# Patient Record
Sex: Female | Born: 1970
Health system: Southern US, Community
[De-identification: ages and names within clinical notes are randomized; demographics above are authoritative.]

## PROBLEM LIST (undated history)

## (undated) DIAGNOSIS — D693 Immune thrombocytopenic purpura: Secondary | ICD-10-CM

## (undated) DIAGNOSIS — F419 Anxiety disorder, unspecified: Secondary | ICD-10-CM

## (undated) DIAGNOSIS — B019 Varicella without complication: Secondary | ICD-10-CM

## (undated) DIAGNOSIS — J439 Emphysema, unspecified: Secondary | ICD-10-CM

## (undated) DIAGNOSIS — I10 Essential (primary) hypertension: Secondary | ICD-10-CM

## (undated) DIAGNOSIS — J301 Allergic rhinitis due to pollen: Secondary | ICD-10-CM

## (undated) DIAGNOSIS — F329 Major depressive disorder, single episode, unspecified: Secondary | ICD-10-CM

## (undated) DIAGNOSIS — R519 Headache, unspecified: Secondary | ICD-10-CM

## (undated) DIAGNOSIS — R7989 Other specified abnormal findings of blood chemistry: Secondary | ICD-10-CM

## (undated) DIAGNOSIS — A0472 Enterocolitis due to Clostridium difficile, not specified as recurrent: Secondary | ICD-10-CM

## (undated) DIAGNOSIS — F32A Depression, unspecified: Secondary | ICD-10-CM

## (undated) DIAGNOSIS — R768 Other specified abnormal immunological findings in serum: Secondary | ICD-10-CM

## (undated) DIAGNOSIS — R7689 Other specified abnormal immunological findings in serum: Secondary | ICD-10-CM

## (undated) DIAGNOSIS — R51 Headache: Secondary | ICD-10-CM

## (undated) HISTORY — PX: TUBAL LIGATION: SHX77

## (undated) HISTORY — DX: Headache, unspecified: R51.9

## (undated) HISTORY — DX: Headache: R51

## (undated) HISTORY — DX: Immune thrombocytopenic purpura: D69.3

## (undated) HISTORY — DX: Enterocolitis due to Clostridium difficile, not specified as recurrent: A04.72

## (undated) HISTORY — DX: Essential (primary) hypertension: I10

## (undated) HISTORY — DX: Other specified abnormal immunological findings in serum: R76.89

## (undated) HISTORY — DX: Varicella without complication: B01.9

## (undated) HISTORY — DX: Emphysema, unspecified: J43.9

## (undated) HISTORY — DX: Allergic rhinitis due to pollen: J30.1

## (undated) HISTORY — DX: Anxiety disorder, unspecified: F41.9

## (undated) HISTORY — DX: Other specified abnormal immunological findings in serum: R76.8

---

## 1995-07-17 HISTORY — PX: TUBAL LIGATION: SHX77

## 2009-10-13 ENCOUNTER — Inpatient Hospital Stay: Payer: Self-pay | Admitting: Internal Medicine

## 2009-12-16 ENCOUNTER — Ambulatory Visit: Payer: Self-pay | Admitting: Family Medicine

## 2012-10-06 ENCOUNTER — Emergency Department: Payer: Self-pay | Admitting: Emergency Medicine

## 2012-10-06 LAB — CBC
HCT: 49 % — ABNORMAL HIGH (ref 35.0–47.0)
HGB: 16.5 g/dL — ABNORMAL HIGH (ref 12.0–16.0)
MCH: 32.6 pg (ref 26.0–34.0)
MCHC: 33.7 g/dL (ref 32.0–36.0)
MCV: 97 fL (ref 80–100)
Platelet: 66 10*3/uL — ABNORMAL LOW (ref 150–440)
RBC: 5.06 10*6/uL (ref 3.80–5.20)
RDW: 12.9 % (ref 11.5–14.5)
WBC: 10.9 10*3/uL (ref 3.6–11.0)

## 2012-10-06 LAB — URINALYSIS, COMPLETE
Bacteria: NONE SEEN
Bilirubin,UR: NEGATIVE
Blood: NEGATIVE
Glucose,UR: NEGATIVE mg/dL (ref 0–75)
Ketone: NEGATIVE
Leukocyte Esterase: NEGATIVE
Nitrite: NEGATIVE
Ph: 7 (ref 4.5–8.0)
Protein: NEGATIVE
RBC,UR: NONE SEEN /HPF (ref 0–5)
Specific Gravity: 1.002 (ref 1.003–1.030)
Squamous Epithelial: <1
WBC UR: NONE SEEN /HPF (ref 0–5)

## 2012-10-06 LAB — COMPREHENSIVE METABOLIC PANEL
Albumin: 4.2 g/dL (ref 3.4–5.0)
Alkaline Phosphatase: 65 U/L (ref 50–136)
Anion Gap: 7 (ref 7–16)
BUN: 9 mg/dL (ref 7–18)
Bilirubin,Total: 0.6 mg/dL (ref 0.2–1.0)
Calcium, Total: 8.9 mg/dL (ref 8.5–10.1)
Chloride: 104 mmol/L (ref 98–107)
Co2: 22 mmol/L (ref 21–32)
Creatinine: 0.8 mg/dL (ref 0.60–1.30)
EGFR (African American): >60
EGFR (Non-African Amer.): >60
Glucose: 109 mg/dL — ABNORMAL HIGH (ref 65–99)
Osmolality: 266 (ref 275–301)
Potassium: 3.6 mmol/L (ref 3.5–5.1)
SGOT(AST): 28 U/L (ref 15–37)
SGPT (ALT): 22 U/L (ref 12–78)
Sodium: 133 mmol/L — ABNORMAL LOW (ref 136–145)
Total Protein: 7.9 g/dL (ref 6.4–8.2)

## 2012-10-06 LAB — TROPONIN I: Troponin-I: <0.02 ng/mL

## 2012-10-22 LAB — LIPID PANEL
Cholesterol: 237 mg/dL — AB (ref 0–200)
HDL: 62 mg/dL (ref 35–70)
LDL Cholesterol: 132 mg/dL
Triglycerides: 216 mg/dL — AB (ref 40–160)

## 2012-10-22 LAB — CBC AND DIFFERENTIAL
HCT: 47 % — AB (ref 36–46)
Hemoglobin: 16.1 g/dL — AB (ref 12.0–16.0)
Platelets: 93 10*3/uL — AB (ref 150–399)
WBC: 7.1 10^3/mL

## 2012-10-22 LAB — HEPATIC FUNCTION PANEL
ALT: 11 U/L (ref 7–35)
AST: 16 U/L (ref 13–35)
Alkaline Phosphatase: 57 U/L (ref 25–125)
Bilirubin, Total: 1.3 mg/dL

## 2012-10-22 LAB — BASIC METABOLIC PANEL
BUN: 7 mg/dL (ref 4–21)
Creatinine: 0.7 mg/dL (ref 0.5–1.1)
Glucose: 75 mg/dL
Potassium: 3.7 mmol/L (ref 3.4–5.3)
Sodium: 14 mmol/L — AB (ref 137–147)

## 2012-10-22 LAB — TSH: TSH: 2.94 u[IU]/mL (ref 0.41–5.90)

## 2014-08-06 ENCOUNTER — Ambulatory Visit: Payer: Self-pay | Admitting: Nurse Practitioner

## 2014-08-23 ENCOUNTER — Encounter: Payer: Self-pay | Admitting: Nurse Practitioner

## 2014-08-23 ENCOUNTER — Encounter (INDEPENDENT_AMBULATORY_CARE_PROVIDER_SITE_OTHER): Payer: Self-pay

## 2014-08-23 ENCOUNTER — Ambulatory Visit (INDEPENDENT_AMBULATORY_CARE_PROVIDER_SITE_OTHER): Payer: BLUE CROSS/BLUE SHIELD | Admitting: Nurse Practitioner

## 2014-08-23 VITALS — BP 162/98 | HR 122 | Temp 98.2°F | Resp 12 | Ht 64.5 in | Wt 183.0 lb

## 2014-08-23 DIAGNOSIS — Z23 Encounter for immunization: Secondary | ICD-10-CM

## 2014-08-23 DIAGNOSIS — F411 Generalized anxiety disorder: Secondary | ICD-10-CM

## 2014-08-23 DIAGNOSIS — I10 Essential (primary) hypertension: Secondary | ICD-10-CM

## 2014-08-23 DIAGNOSIS — Z7189 Other specified counseling: Secondary | ICD-10-CM

## 2014-08-23 DIAGNOSIS — Z720 Tobacco use: Secondary | ICD-10-CM

## 2014-08-23 DIAGNOSIS — E669 Obesity, unspecified: Secondary | ICD-10-CM | POA: Insufficient documentation

## 2014-08-23 DIAGNOSIS — F17200 Nicotine dependence, unspecified, uncomplicated: Secondary | ICD-10-CM

## 2014-08-23 DIAGNOSIS — Z7689 Persons encountering health services in other specified circumstances: Secondary | ICD-10-CM

## 2014-08-23 HISTORY — DX: Generalized anxiety disorder: F41.1

## 2014-08-23 MED ORDER — METOPROLOL SUCCINATE ER 25 MG PO TB24: 25.0000 mg | ORAL_TABLET | Freq: Every day | ORAL | Status: DC

## 2014-08-23 MED ORDER — BUPROPION HCL ER (XL) 150 MG PO TB24: 150.0000 mg | ORAL_TABLET | Freq: Every day | ORAL | Status: DC

## 2014-08-23 NOTE — Assessment & Plan Note (Addendum)
Establishing care today going over information related to PFSHx and acute/chronic problems. Pt due for annual physical. Got flu and tdap today

## 2014-08-23 NOTE — Patient Instructions (Addendum)
Follow up in 1 month for an annual physical   -Mammogram and pap smear at that time  -Follow up on BP and Anxiety medications   Check BP and write down to bring to next visit.   You can up to 2 of the wellbutrin pills after 7 days.   Generalized Anxiety Disorder Generalized anxiety disorder (GAD) is a mental disorder. It interferes with life functions, including relationships, work, and school. GAD is different from normal anxiety, which everyone experiences at some point in their lives in response to specific life events and activities. Normal anxiety actually helps [place] prepare for and get through these life events and activities. Normal anxiety goes away after the event or activity is over.  GAD causes anxiety that is not necessarily related to specific events or activities. It also causes excess anxiety in proportion to specific events or activities. The anxiety associated with GAD is also difficult to control. GAD can vary from mild to severe. People with severe GAD can have intense waves of anxiety with physical symptoms (panic attacks).  SYMPTOMS The anxiety and worry associated with GAD are difficult to control. This anxiety and worry are related to many life events and activities and also occur more days than not for 6 months or longer. People with GAD also have three or more of the following symptoms (one or more in children):  Restlessness.   Fatigue.  Difficulty concentrating.   Irritability.  Muscle tension.  Difficulty sleeping or unsatisfying sleep. DIAGNOSIS GAD is diagnosed through an assessment by your health care provider. Your health care provider will ask you questions aboutyour mood,physical symptoms, and events in your life. Your health care provider may ask you about your medical history and use of alcohol or drugs, including prescription medicines. Your health care provider may also do a physical exam and blood tests. Certain medical conditions and the use of  certain substances can cause symptoms similar to those associated with GAD. Your health care provider may refer you to a mental health specialist for further evaluation. TREATMENT The following therapies are usually used to treat GAD:   Medication. Antidepressant medication usually is prescribed for long-term daily control. Antianxiety medicines may be added in severe cases, especially when panic attacks occur.   Talk therapy (psychotherapy). Certain types of talk therapy can be helpful in treating GAD by providing support, education, and guidance. A form of talk therapy called cognitive behavioral therapy can teach you healthy ways to think about and react to daily life events and activities.  Stress managementtechniques. These include yoga, meditation, and exercise and can be very helpful when they are practiced regularly. A mental health specialist can help determine which treatment is best for you. Some people see improvement with one therapy. However, other people require a combination of therapies. Document Released: 10/27/2012 Document Revised: 11/16/2013 Document Reviewed: 10/27/2012 [facility] Patient Information 2015 [place], [place]. This information is not intended to replace advice given to you by your health care provider. Make sure you discuss any questions you have with your health care provider.

## 2014-08-23 NOTE — Progress Notes (Signed)
Subjective:    Patient ID: Carly [name], female    DOB: 01-May-1971, 44 y.o.   MRN: [id number]  HPI  [name] is a 44 yo female with a CC of establishing care and anxiety/sleep disorder. She is here with her husband today.   Saw Dr. Rex Kras in [place] previously and [facility]  1) New Pt info:   Diet- Eats at home and goes out  Exercise- Walks all day at work   Immunizations- Needs flu, Tdap today  Mammogram- Never had before  Pap- 7 years approx.   Bone Density- N/A  Colonoscopy- N/A  PSA- N/A  Eye Exam- 10 years ago   Dental Exam- Last year  LMP- 07/30/2014 lasted 6 days   2) Chronic Problems-  HTN- On High BP meds previously, Atenolol 100 mg daily- made her feel bad, checks when out around pharmacies 160/110, Eats a lot of fast food and drinks caffeine.   B12 was low last year- takes supplements   3) Acute Problems-  Anxiety disorder- PCP, Zoloft increased to 100 mg helpful for sleeping and anxiety. Wakes up with racing heart and mind.   2-3 hours of sleep a night, okay falling asleep, trouble staying asleep. Temazepam 15 mg  Tylenol PM- not helpful, Melatonin- Helped, but not great  Going to a therapist now that she has insurance.   Review of Systems  Constitutional: Positive for fatigue. Negative for fever, chills and diaphoresis.  HENT: Negative for hearing loss and tinnitus.   Eyes: Negative for visual disturbance.  Respiratory: Negative for chest tightness, shortness of breath and wheezing.   Cardiovascular: Negative for chest pain, palpitations and leg swelling.  Gastrointestinal: Negative for nausea, vomiting, diarrhea and constipation.  Genitourinary: Negative for dysuria.  Skin: Negative for rash.  Neurological: Positive for headaches. Negative for dizziness, weakness and numbness.  Psychiatric/Behavioral: Positive for sleep disturbance. The patient is nervous/anxious.    Past Medical History  Diagnosis Date  . Chicken pox   . Emphysema of  lung   . Frequent headaches   . Hay fever   . Hypertension   . Anxiety     History   Social History  . Marital Status: Married    Spouse Name: N/A    Number of Children: N/A  . Years of Education: N/A   Occupational History  . Not on file.   Social History Main Topics  . Smoking status: Current Every Day Smoker -- 1.50 packs/day    Types: Cigarettes  . Smokeless tobacco: Never Used  . Alcohol Use: 4.8 oz/week    8 Not specified per week     Comment: Beer (Can)   . Drug Use: No  . Sexual Activity:    Partners: Male   Other Topics Concern  . Not on file   Social History Narrative   60 Year college    Works in a [employer]   Married to [place]    Enjoys coloring    Pets: Dog lives inside   Children: 80 Son 19 Son       Past Surgical History  Procedure Laterality Date  . Tubal ligation  1997    Family History  Problem Relation Age of Onset  . Hyperlipidemia Mother   . Heart disease Mother   . Stroke Mother   . Depression Mother   . Mental illness Mother   . Hyperlipidemia Father   . Depression Father   . Mental illness Father   .  Diabetes Paternal Grandfather     No Known Allergies  No current outpatient prescriptions on file prior to visit.   No current facility-administered medications on file prior to visit.      Objective:   Physical Exam  Constitutional: She is oriented to person, place, and time. She appears well-developed and well-nourished. No distress.  BP 162/98 mmHg  Pulse 122  Temp(Src) 98.2 F (36.8 C) (Oral)  Resp 12  Ht 5' 4.5" (1.638 m)  Wt 183 lb (83.008 kg)  BMI 30.94 kg/m2  SpO2 97% Pulse repeat is 100   HENT:  Head: Normocephalic and atraumatic.  Right Ear: External ear normal.  Left Ear: External ear normal.  Mouth/Throat: No oropharyngeal exudate.  Eyes: Conjunctivae and EOM are normal. Pupils are equal, round, and reactive to light. Right eye exhibits no discharge. Left eye exhibits no  discharge. No scleral icterus.  Neck: Normal range of motion. Neck supple. No thyromegaly present.  Cardiovascular: Normal rate, regular rhythm, normal heart sounds and intact distal pulses.  Exam reveals no gallop and no friction rub.   No murmur heard. Pulmonary/Chest: Effort normal and breath sounds normal. No respiratory distress. She has no wheezes. She has no rales. She exhibits no tenderness.  Lymphadenopathy:    She has no cervical adenopathy.  Neurological: She is alert and oriented to person, place, and time. No cranial nerve deficit. She exhibits normal muscle tone. Coordination normal.  Skin: Skin is warm and dry. No rash noted. She is not diaphoretic.  Psychiatric: She has a normal mood and affect. Her behavior is normal. Judgment and thought content normal.      Assessment & Plan:

## 2014-08-23 NOTE — Progress Notes (Signed)
Pre visit review using our clinic review tool, if applicable. No additional management support is needed unless otherwise documented below in the visit note. 

## 2014-08-23 NOTE — Assessment & Plan Note (Addendum)
Worsening. Start on Toprol-XL 25 mg and will re-check in 1 month. Asked pt to work on eating healthier, cutting caffeine and work on cessation.

## 2014-08-23 NOTE — Assessment & Plan Note (Signed)
Worsening. Will start seeing psychiatry again since she has insurance. Start on Wellbutrin XL 150 mg daily. Up after 7 days to 300 mg. FU next month.

## 2014-08-23 NOTE — Assessment & Plan Note (Signed)
Will try Wellbutrin for smoking and anxiety. FU next month

## 2014-08-23 NOTE — Assessment & Plan Note (Signed)
Stable. Pt working on eating healthier. Will follow next month.

## 2014-08-24 ENCOUNTER — Telehealth: Payer: Self-pay | Admitting: Nurse Practitioner

## 2014-08-24 NOTE — Telephone Encounter (Signed)
emmi mailed  °

## 2014-08-26 ENCOUNTER — Telehealth: Payer: Self-pay | Admitting: Nurse Practitioner

## 2014-08-26 NOTE — Telephone Encounter (Signed)
emmi mailed  °

## 2014-08-31 ENCOUNTER — Encounter: Payer: Self-pay | Admitting: Nurse Practitioner

## 2014-08-31 ENCOUNTER — Other Ambulatory Visit: Payer: Self-pay | Admitting: Nurse Practitioner

## 2014-08-31 MED ORDER — CLONAZEPAM 0.5 MG PO TABS: 0.5000 mg | ORAL_TABLET | Freq: Every day | ORAL | Status: DC

## 2014-09-07 ENCOUNTER — Ambulatory Visit (INDEPENDENT_AMBULATORY_CARE_PROVIDER_SITE_OTHER): Payer: BLUE CROSS/BLUE SHIELD | Admitting: Nurse Practitioner

## 2014-09-07 ENCOUNTER — Other Ambulatory Visit (HOSPITAL_COMMUNITY)
Admission: RE | Admit: 2014-09-07 | Discharge: 2014-09-07 | Disposition: A | Discharge status: Home / Self Care | Payer: BLUE CROSS/BLUE SHIELD | Source: Ambulatory Visit | Attending: Nurse Practitioner | Admitting: Nurse Practitioner

## 2014-09-07 ENCOUNTER — Encounter: Payer: Self-pay | Admitting: Nurse Practitioner

## 2014-09-07 VITALS — BP 150/100 | HR 101 | Temp 98.6°F | Resp 12 | Ht 64.6 in | Wt 183.8 lb

## 2014-09-07 DIAGNOSIS — F17200 Nicotine dependence, unspecified, uncomplicated: Secondary | ICD-10-CM

## 2014-09-07 DIAGNOSIS — Z1239 Encounter for other screening for malignant neoplasm of breast: Secondary | ICD-10-CM

## 2014-09-07 DIAGNOSIS — Z01419 Encounter for gynecological examination (general) (routine) without abnormal findings: Secondary | ICD-10-CM | POA: Diagnosis not present

## 2014-09-07 DIAGNOSIS — Z1151 Encounter for screening for human papillomavirus (HPV): Secondary | ICD-10-CM | POA: Insufficient documentation

## 2014-09-07 DIAGNOSIS — Z0000 Encounter for general adult medical examination without abnormal findings: Secondary | ICD-10-CM

## 2014-09-07 DIAGNOSIS — E669 Obesity, unspecified: Secondary | ICD-10-CM

## 2014-09-07 DIAGNOSIS — I10 Essential (primary) hypertension: Secondary | ICD-10-CM

## 2014-09-07 DIAGNOSIS — Z720 Tobacco use: Secondary | ICD-10-CM

## 2014-09-07 DIAGNOSIS — F411 Generalized anxiety disorder: Secondary | ICD-10-CM

## 2014-09-07 LAB — LIPID PANEL
Cholesterol: 195 mg/dL (ref 0–200)
HDL: 62.1 mg/dL (ref >39.00)
NonHDL: 132.9
Total CHOL/HDL Ratio: 3
Triglycerides: 234 mg/dL — ABNORMAL HIGH (ref 0.0–149.0)
VLDL: 46.8 mg/dL — ABNORMAL HIGH (ref 0.0–40.0)

## 2014-09-07 LAB — COMPREHENSIVE METABOLIC PANEL
ALT: 12 U/L (ref 0–35)
AST: 16 U/L (ref 0–37)
Albumin: 4.1 g/dL (ref 3.5–5.2)
Alkaline Phosphatase: 57 U/L (ref 39–117)
BUN: 5 mg/dL — ABNORMAL LOW (ref 6–23)
CO2: 25 mEq/L (ref 19–32)
Calcium: 8.8 mg/dL (ref 8.4–10.5)
Chloride: 108 mEq/L (ref 96–112)
Creatinine, Ser: 0.78 mg/dL (ref 0.40–1.20)
GFR: 85.62 mL/min (ref >60.00)
Glucose, Bld: 88 mg/dL (ref 70–99)
Potassium: 3 mEq/L — ABNORMAL LOW (ref 3.5–5.1)
Sodium: 140 mEq/L (ref 135–145)
Total Bilirubin: 0.8 mg/dL (ref 0.2–1.2)
Total Protein: 6.5 g/dL (ref 6.0–8.3)

## 2014-09-07 LAB — LDL CHOLESTEROL, DIRECT: Direct LDL: 102 mg/dL

## 2014-09-07 LAB — TSH: TSH: 2.1 u[IU]/mL (ref 0.35–4.50)

## 2014-09-07 LAB — HEMOGLOBIN A1C: Hgb A1c MFr Bld: 4.9 % (ref 4.6–6.5)

## 2014-09-07 MED ORDER — CLONAZEPAM 0.5 MG PO TABS: 0.5000 mg | ORAL_TABLET | Freq: Every day | ORAL | Status: DC

## 2014-09-07 MED ORDER — SERTRALINE HCL 25 MG PO TABS: 25.0000 mg | ORAL_TABLET | Freq: Every day | ORAL | Status: DC

## 2014-09-07 MED ORDER — METOPROLOL SUCCINATE ER 50 MG PO TB24: 50.0000 mg | ORAL_TABLET | Freq: Every day | ORAL | Status: DC

## 2014-09-07 NOTE — Progress Notes (Signed)
Pre visit review using our clinic review tool, if applicable. No additional management support is needed unless otherwise documented below in the visit note. 

## 2014-09-07 NOTE — Progress Notes (Signed)
Subjective:    Patient ID: Carly [name], female    DOB: January 24, 1971, 44 y.o.   MRN: [id number]  HPI  Carly [name] is a 44 yo female here for annual physical exam.  1) Health Maintenance-   Diet- Cut out sodium  Exercise- walking more in evenings  Immunizations-  UTD  Mammogram- Referral  Pap- Today   Bone Density- N/A  Colonoscopy-N/A  Eye Exam- 4-5 years ago  Dental Exam- Last year  LMP- 08/27/14 lasts 5 days   Past 2 periods heavier for 1 day of the five  Tobacco- less than 1 ppd, would like to stop smoking, wants to try Carly [name] gum.   2) Chronic Problems-  Still feeling anxious, does not like Carly [name], feels made more often. She slept well with the Carly [name] "better than I have in years". She would like to try Carly [name] as it was useful in past.   3) Acute Problems-  HTN- BP up today. Will increase Carly [name]-xl to 50 mg daily.    Cutting out sodium and making lifestyle changes   Review of Systems  Constitutional: Negative for fever, chills, diaphoresis, fatigue and unexpected weight change.  HENT: Negative for tinnitus and trouble swallowing.   Eyes: Negative for visual disturbance.  Respiratory: Negative for chest tightness, shortness of breath and wheezing.   Cardiovascular: Negative for chest pain, palpitations and leg swelling.  Gastrointestinal: Negative for nausea, vomiting, abdominal pain, diarrhea, constipation, blood in stool and rectal pain.  Endocrine: Negative for polydipsia, polyphagia and polyuria.  Genitourinary: Negative for dysuria, hematuria, vaginal discharge and vaginal pain.  Musculoskeletal: Negative for myalgias, back pain, arthralgias and gait problem.  Skin: Negative for color change and rash.  Neurological: Negative for dizziness, weakness, numbness and headaches.  Hematological: Does not bruise/bleed easily.  Psychiatric/Behavioral: Negative for suicidal ideas and sleep disturbance. The patient is not nervous/anxious.    Past Medical History    Diagnosis Date  . Chicken pox   . Emphysema of lung   . Frequent headaches   . Hay fever   . Hypertension   . Anxiety     History   Social History  . Marital Status: Married    Spouse Name: N/A  . Number of Children: N/A  . Years of Education: N/A   Occupational History  . Not on file.   Social History Main Topics  . Smoking status: Current Every Day Smoker -- 1.50 packs/day    Types: Cigarettes  . Smokeless tobacco: Never Used     Comment: Wants to try Carly [name] gum   . Alcohol Use: 4.8 oz/week    8 Standard drinks or equivalent per week     Comment: Beer (Can)   . Drug Use: No  . Sexual Activity:    Partners: Male   Other Topics Concern  . Not on file   Social History Narrative   42 Year college    Works in a [employer]   Married to [place]    Enjoys coloring    Pets: Dog lives inside   Children: 83 Son 38 Son       Past Surgical History  Procedure Laterality Date  . Tubal ligation  1997    Family History  Problem Relation Age of Onset  . Hyperlipidemia Mother   . Heart disease Mother   . Stroke Mother   . Depression Mother   . Mental illness Mother   . Hyperlipidemia Father   . Depression Father   .  Mental illness Father   . Diabetes Paternal Grandfather     No Known Allergies  No current outpatient prescriptions on file prior to visit.   No current facility-administered medications on file prior to visit.      Objective:   Physical Exam  Constitutional: She is oriented to person, place, and time. She appears well-developed and well-nourished. No distress.  BP 150/100 mmHg  Pulse 101  Temp(Src) 98.6 F (37 C) (Oral)  Resp 12  Ht 5' 4.6" (1.641 m)  Wt 183 lb 12.8 oz (83.371 kg)  BMI 30.96 kg/m2  SpO2 98%  LMP  (Approximate)   HENT:  Head: Normocephalic and atraumatic.  Right Ear: External ear normal.  Left Ear: External ear normal.  Nose: Nose normal.  Mouth/Throat: Oropharynx is clear and moist. No  oropharyngeal exudate.  TMs and canals clear bilaterally  Eyes: Conjunctivae and EOM are normal. Pupils are equal, round, and reactive to light. Right eye exhibits no discharge. Left eye exhibits no discharge. No scleral icterus.  Neck: Normal range of motion. Neck supple. No thyromegaly present.  Cardiovascular: Normal rate, regular rhythm, normal heart sounds and intact distal pulses.  Exam reveals no gallop and no friction rub.   No murmur heard. Pulmonary/Chest: Effort normal. No respiratory distress. She has wheezes. She has no rales. She exhibits no tenderness.  1 wheeze heard the first time, repeat no wheezing   Abdominal: Soft. Bowel sounds are normal. She exhibits no distension and no mass. There is no tenderness. There is no rebound and no guarding.  Genitourinary: Vagina normal. No vaginal discharge found.  Breast exam normal, no masses, increased hair growth on chest and chin. PAP obtained.   Musculoskeletal: Normal range of motion. She exhibits no edema or tenderness.  Lymphadenopathy:    She has no cervical adenopathy.  Neurological: She is alert and oriented to person, place, and time. She has normal reflexes. No cranial nerve deficit. She exhibits normal muscle tone. Coordination normal.  Skin: Skin is warm and dry. No rash noted. She is not diaphoretic. No erythema. No pallor.  Psychiatric: She has a normal mood and affect. Her behavior is normal. Judgment and thought content normal.      Assessment & Plan:

## 2014-09-07 NOTE — Patient Instructions (Signed)
Please make a nurse visit for BP check in 2 weeks.   Health Maintenance Adopting a healthy lifestyle and getting preventive care can go a long way to promote health and wellness. Talk with your health care provider about what schedule of regular examinations is right for you. This is a good chance for you to check in with your provider about disease prevention and staying healthy. In between checkups, there are plenty of things you can do on your own. Experts have done a lot of research about which lifestyle changes and preventive measures are most likely to keep you healthy. Ask your health care provider for more information. WEIGHT AND DIET  Eat a healthy diet  Be sure to include plenty of vegetables, fruits, low-fat dairy products, and lean protein.  Do not eat a lot of foods high in solid fats, added sugars, or salt.  Get regular exercise. This is one of the most important things you can do for your health.  Most adults should exercise for at least 150 minutes each week. The exercise should increase your heart rate and make you sweat (moderate-intensity exercise).  Most adults should also do strengthening exercises at least twice a week. This is in addition to the moderate-intensity exercise.  Maintain a healthy weight  Body mass index (BMI) is a measurement that can be used to identify possible weight problems. It estimates body fat based on height and weight. Your health care provider can help determine your BMI and help you achieve or maintain a healthy weight.  For females 9 years of age and older:   A BMI below 18.5 is considered underweight.  A BMI of 18.5 to 24.9 is normal.  A BMI of 25 to 29.9 is considered overweight.  A BMI of 30 and above is considered obese.  Watch levels of cholesterol and blood lipids  You should start having your blood tested for lipids and cholesterol at 43 years of age, then have this test every 5 years.  You may need to have your  cholesterol levels checked more often if:  Your lipid or cholesterol levels are high.  You are older than 43 years of age.  You are at high risk for heart disease.  CANCER SCREENING   Lung Cancer  Lung cancer screening is recommended for adults 85-34 years old who are at high risk for lung cancer because of a history of smoking.  A yearly low-dose CT scan of the lungs is recommended for people who:  Currently smoke.  Have quit within the past 15 years.  Have at least a 30-pack-year history of smoking. A pack year is smoking an average of one pack of cigarettes a day for 1 year.  Yearly screening should continue until it has been 15 years since you quit.  Yearly screening should stop if you develop a health problem that would prevent you from having lung cancer treatment.  Breast Cancer  Practice breast self-awareness. This means understanding how your breasts normally appear and feel.  It also means doing regular breast self-exams. Let your health care provider know about any changes, no matter how small.  If you are in your 20s or 30s, you should have a clinical breast exam (CBE) by a health care provider every 1-3 years as part of a regular health exam.  If you are 31 or older, have a CBE every year. Also consider having a breast X-ray (mammogram) every year.  If you have a family history of breast cancer,  talk to your health care provider about genetic screening.  If you are at high risk for breast cancer, talk to your health care provider about having an MRI and a mammogram every year.  Breast cancer gene (BRCA) assessment is recommended for women who have family members with BRCA-related cancers. BRCA-related cancers include:  Breast.  Ovarian.  Tubal.  Peritoneal cancers.  Results of the assessment will determine the need for genetic counseling and BRCA1 and BRCA2 testing. Cervical Cancer Routine pelvic examinations to screen for cervical cancer are no  longer recommended for nonpregnant women who are considered low risk for cancer of the pelvic organs (ovaries, uterus, and vagina) and who do not have symptoms. A pelvic examination may be necessary if you have symptoms including those associated with pelvic infections. Ask your health care provider if a screening pelvic exam is right for you.   The Pap test is the screening test for cervical cancer for women who are considered at risk.  If you had a hysterectomy for a problem that was not cancer or a condition that could lead to cancer, then you no longer need Pap tests.  If you are older than 65 years, and you have had normal Pap tests for the past 10 years, you no longer need to have Pap tests.  If you have had past treatment for cervical cancer or a condition that could lead to cancer, you need Pap tests and screening for cancer for at least 20 years after your treatment.  If you no longer get a Pap test, assess your risk factors if they change (such as having a new sexual partner). This can affect whether you should start being screened again.  Some women have medical problems that increase their chance of getting cervical cancer. If this is the case for you, your health care provider may recommend more frequent screening and Pap tests.  The human papillomavirus (HPV) test is another test that may be used for cervical cancer screening. The HPV test looks for the virus that can cause cell changes in the cervix. The cells collected during the Pap test can be tested for HPV.  The HPV test can be used to screen women 12 years of age and older. Getting tested for HPV can extend the interval between normal Pap tests from three to five years.  An HPV test also should be used to screen women of any age who have unclear Pap test results.  After 43 years of age, women should have HPV testing as often as Pap tests.  Colorectal Cancer  This type of cancer can be detected and often  prevented.  Routine colorectal cancer screening usually begins at 43 years of age and continues through 43 years of age.  Your health care provider may recommend screening at an earlier age if you have risk factors for colon cancer.  Your health care provider may also recommend using home test kits to check for hidden blood in the stool.  A small camera at the end of a tube can be used to examine your colon directly (sigmoidoscopy or colonoscopy). This is done to check for the earliest forms of colorectal cancer.  Routine screening usually begins at age 35.  Direct examination of the colon should be repeated every 5-10 years through 43 years of age. However, you may need to be screened more often if early forms of precancerous polyps or small growths are found. Skin Cancer  Check your skin from head to toe regularly.  Tell your health care provider about any new moles or changes in moles, especially if there is a change in a mole's shape or color.  Also tell your health care provider if you have a mole that is larger than the size of a pencil eraser.  Always use sunscreen. Apply sunscreen liberally and repeatedly throughout the day.  Protect yourself by wearing long sleeves, pants, a wide-brimmed hat, and sunglasses whenever you are outside. HEART DISEASE, DIABETES, AND HIGH BLOOD PRESSURE   Have your blood pressure checked at least every 1-2 years. High blood pressure causes heart disease and increases the risk of stroke.  If you are between 72 years and 44 years old, ask your health care provider if you should take aspirin to prevent strokes.  Have regular diabetes screenings. This involves taking a blood sample to check your fasting blood sugar level.  If you are at a normal weight and have a low risk for diabetes, have this test once every three years after 43 years of age.  If you are overweight and have a high risk for diabetes, consider being tested at a younger age or more  often. PREVENTING INFECTION  Hepatitis B  If you have a higher risk for hepatitis B, you should be screened for this virus. You are considered at high risk for hepatitis B if:  You were born in a country where hepatitis B is common. Ask your health care provider which countries are considered high risk.  Your parents were born in a high-risk country, and you have not been immunized against hepatitis B (hepatitis B vaccine).  You have HIV or AIDS.  You use needles to inject street drugs.  You live with someone who has hepatitis B.  You have had sex with someone who has hepatitis B.  You get hemodialysis treatment.  You take certain medicines for conditions, including cancer, organ transplantation, and autoimmune conditions. Hepatitis C  Blood testing is recommended for:  Everyone born from [birth year] through 1964.  Anyone with known risk factors for hepatitis C. Sexually transmitted infections (STIs)  You should be screened for sexually transmitted infections (STIs) including gonorrhea and chlamydia if:  You are sexually active and are younger than 43 years of age.  You are older than 43 years of age and your health care provider tells you that you are at risk for this type of infection.  Your sexual activity has changed since you were last screened and you are at an increased risk for chlamydia or gonorrhea. Ask your health care provider if you are at risk.  If you do not have HIV, but are at risk, it may be recommended that you take a prescription medicine daily to prevent HIV infection. This is called pre-exposure prophylaxis (PrEP). You are considered at risk if:  You are sexually active and do not regularly use condoms or know the HIV status of your partner(s).  You take drugs by injection.  You are sexually active with a partner who has HIV. Talk with your health care provider about whether you are at high risk of being infected with HIV. If you choose to begin PrEP, you  should first be tested for HIV. You should then be tested every 3 months for as long as you are taking PrEP.  PREGNANCY   If you are premenopausal and you may become pregnant, ask your health care provider about preconception counseling.  If you may become pregnant, take 400 to 800 micrograms (mcg) of folic acid  every day.  If you want to prevent pregnancy, talk to your health care provider about birth control (contraception). OSTEOPOROSIS AND MENOPAUSE   Osteoporosis is a disease in which the bones lose minerals and strength with aging. This can result in serious bone fractures. Your risk for osteoporosis can be identified using a bone density scan.  If you are 63 years of age or older, or if you are at risk for osteoporosis and fractures, ask your health care provider if you should be screened.  Ask your health care provider whether you should take a calcium or vitamin D supplement to lower your risk for osteoporosis.  Menopause may have certain physical symptoms and risks.  Hormone replacement therapy may reduce some of these symptoms and risks. Talk to your health care provider about whether hormone replacement therapy is right for you.  HOME CARE INSTRUCTIONS   Schedule regular health, dental, and eye exams.  Stay current with your immunizations.   Do not use any tobacco products including cigarettes, chewing tobacco, or electronic cigarettes.  If you are pregnant, do not drink alcohol.  If you are breastfeeding, limit how much and how often you drink alcohol.  Limit alcohol intake to no more than 1 drink per day for nonpregnant women. One drink equals 12 ounces of beer, 5 ounces of wine, or 1 ounces of hard liquor.  Do not use street drugs.  Do not share needles.  Ask your health care provider for help if you need support or information about quitting drugs.  Tell your health care provider if you often feel depressed.  Tell your health care provider if you have ever  been abused or do not feel safe at home. Document Released: 01/15/2011 Document Revised: 11/16/2013 Document Reviewed: 06/03/2013 [facility] Patient Information 2015 [place], [place]. This information is not intended to replace advice given to you by your health care provider. Make sure you discuss any questions you have with your health care provider.

## 2014-09-08 ENCOUNTER — Encounter: Payer: Self-pay | Admitting: Nurse Practitioner

## 2014-09-08 ENCOUNTER — Other Ambulatory Visit: Payer: Self-pay | Admitting: Nurse Practitioner

## 2014-09-08 DIAGNOSIS — R7989 Other specified abnormal findings of blood chemistry: Secondary | ICD-10-CM

## 2014-09-08 DIAGNOSIS — L680 Hirsutism: Secondary | ICD-10-CM

## 2014-09-08 LAB — TESTOSTERONE: Testosterone: 114 ng/dL — ABNORMAL HIGH (ref 10–70)

## 2014-09-09 LAB — CYTOLOGY - PAP

## 2014-09-10 ENCOUNTER — Encounter: Payer: Self-pay | Admitting: Nurse Practitioner

## 2014-09-10 DIAGNOSIS — Z0000 Encounter for general adult medical examination without abnormal findings: Secondary | ICD-10-CM | POA: Insufficient documentation

## 2014-09-10 NOTE — Assessment & Plan Note (Signed)
Wellbutrin not helpful. Wants to try nicorette gum. Discussed options with pt and she is willing to try to stop smoking. Understands health risks.

## 2014-09-10 NOTE — Assessment & Plan Note (Addendum)
Increased Metoprolol succinate 50 mg daily. FU in 2 wks for BP check nurse visit.

## 2014-09-10 NOTE — Assessment & Plan Note (Signed)
Stop Wellbutrin and start Zoloft 25 mg. Follow up in 6 weeks. Refill of Klonopin 0.5 mg since it was helpful for anxiety and sleep.

## 2014-09-10 NOTE — Assessment & Plan Note (Signed)
Increased walking and cut down on sodium in diet. Small changes being made. Will follow.

## 2014-09-10 NOTE — Assessment & Plan Note (Signed)
Discussed acute and chronic issues. Reviewed health maintenance measures, PFSHx, and immunizations. Obtain routine labs TSH, Lipid panel, CBC w/ diff, A1c, and CMET. Non-routine labs: Testosterone for hirsutism. PAP and Breast exam performed today with normal findings. Mammogram referral and endocrine referral placed.

## 2014-09-13 LAB — TESTOSTERONE, FREE, TOTAL, SHBG
Sex Hormone Binding: 113 nmol/L (ref 17–124)
Testosterone, Free: 8.1 pg/mL — ABNORMAL HIGH (ref 0.6–6.8)
Testosterone-% Free: 0.7 % (ref 0.4–2.4)
Testosterone: 108 ng/dL — ABNORMAL HIGH (ref 10–70)

## 2014-09-21 ENCOUNTER — Ambulatory Visit: Payer: BLUE CROSS/BLUE SHIELD | Admitting: *Deleted

## 2014-09-21 ENCOUNTER — Other Ambulatory Visit: Payer: Self-pay | Admitting: Nurse Practitioner

## 2014-09-21 VITALS — BP 152/96 | HR 102

## 2014-09-21 DIAGNOSIS — I10 Essential (primary) hypertension: Secondary | ICD-10-CM

## 2014-09-21 MED ORDER — AMLODIPINE BESYLATE 5 MG PO TABS: 5.0000 mg | ORAL_TABLET | Freq: Every day | ORAL | Status: DC

## 2014-09-21 NOTE — Progress Notes (Signed)
Pt presented for BP check. Confirmed taking Metoprolol 50 mg daily. Has readings from earlier today, 150/107 and 168/113. BP here 152/96 HR 102. [place] notified. Advised to start Amlodipine 5 mg daily and continue Metoprolol. Advised to continue to monitor BP and notify us of continuing high readings. Advised to schedule appt with Morey Hummingbird in 3 weeks,  verbalized understanding. Appt scheduled 10/12/14.

## 2014-09-22 ENCOUNTER — Encounter: Payer: Self-pay | Admitting: Endocrinology

## 2014-09-22 ENCOUNTER — Ambulatory Visit (INDEPENDENT_AMBULATORY_CARE_PROVIDER_SITE_OTHER): Payer: BLUE CROSS/BLUE SHIELD | Admitting: Endocrinology

## 2014-09-22 VITALS — BP 162/94 | HR 99 | Resp 14 | Ht 64.5 in | Wt 183.0 lb

## 2014-09-22 DIAGNOSIS — I10 Essential (primary) hypertension: Secondary | ICD-10-CM

## 2014-09-22 DIAGNOSIS — L680 Hirsutism: Secondary | ICD-10-CM

## 2014-09-22 DIAGNOSIS — R7989 Other specified abnormal findings of blood chemistry: Secondary | ICD-10-CM

## 2014-09-22 DIAGNOSIS — E349 Endocrine disorder, unspecified: Secondary | ICD-10-CM

## 2014-09-22 MED ORDER — DEXAMETHASONE 1 MG PO TABS: ORAL_TABLET | ORAL | Status: DC

## 2014-09-22 NOTE — Assessment & Plan Note (Signed)
We discussed about causes of elevated Testosterone in her age group including most likely PCOS, subclinical Cushing's , adrenal adenoma, hyperPRL.   Would like her to come back this week and will test morning cortisol after overnight 1 mg dex, PRL, BMP, DHEAS.   If the above workup is negative, then she most likely has PCOS. Encouraged her to follow healthy lifestyle.  Currently, she is experiencing hirsutism, elevated Testosterone and some irreg in menses.  We discussed various treatments for her symptoms.  Based On labs , will decide of whether to initiate Ortho-tri-cyclenlo ( pt preference towards starting OCPs) or Spirinolactone. I am little hesistant to start OCPs due to her smoking status, age , FH breast cancer.

## 2014-09-22 NOTE — Patient Instructions (Signed)
Work on [employer] and exercise.  Work on quitting smoking- going to discuss Chantix with PCP at fu appt.   You should do a "dexamethasone suppression test."  for this- Take dexamethasone 1 mg at 11 pm, then come in for a "cortisol" blood test the next morning between 8-9 am. You do not need to be fasting for this test.  Return for labs tomorrow between 8-9 am.   Based on lab testing- will consider trial of oral contraceptive pills versus Spironolactone.   Please come back for a follow-up appointment in 3 months

## 2014-09-22 NOTE — Assessment & Plan Note (Signed)
encouaraged compliance with recently added Amlodipine. She has follow up with PCP in few weeks.

## 2014-09-22 NOTE — Assessment & Plan Note (Signed)
Likely from elevated Testosterone. Continue manual methods of hair removal at this time.

## 2014-09-22 NOTE — Progress Notes (Signed)
Pre visit review using our clinic review tool, if applicable. No additional management support is needed unless otherwise documented below in the visit note. 

## 2014-09-22 NOTE — Progress Notes (Signed)
Patient ID: Carly [name], female   DOB: Apr 26, 1971, 44 y.o.   MRN: [id number] HPI: Carly [name] is a 44 y.o. female, referred by her PCP,  Doss, Velora Heckler, NP, for evaluation for elevated Testosterone/Hirsuitism.  The above symptom was recently discussed at her PCP appointment and labs checked Feb 2015 showed an elevated Testosterone level.  Lab Results  Component Value Date   TESTOSTERONE 114* 09/07/2014     Fertility/Menstrual cycles: - menarche at age at age 84 - irregular menses only recently- occuring every  3 weeks, recently in past 2 times 1 day of 4-5 days would be lot heavier- happening for past 3-4 months - no h/o ovarian cysts - children: 2 - miscarriages: 0 - contraception:TL -No breast discharge   Hirsutism: -Onset around 2006 Some on chest and chin Shaves 3-4 times daily Gradually gotten worse over the years No voice change Scalp hair thinning in the past year   Acne: - none significant  Weight gain: - recently stable - no steroid use - no weight loss meds  -No abnormal stretch marks or easy bruising - Diets tried: - has been following a relatively healthier lifestyle     Associated history of : - no CAD - No DM or Pre-DM - No hyperlipidemia - No hypothyroidism BP on and off for 10 years. Recently amlodipine was added to her regimen- but she hasn't yet started it. Today has elevated BP, but denies any systemic symptoms.     Treatments tried: - did not try Metformin - did not try Spironolactone. Recently was detected with low K and is eating more bananas these days - did not try Vaniqua - not on OCPs. No prior personal hx blood clots. Paternal cousin has hx breast cancer. Maternal sister has cervical cancer.  - Last thyroid tests: Lab Results  Component Value Date   TSH 2.10 09/07/2014    - Last set of lipids:    Component Value Date/Time   CHOL 195 09/07/2014 1418   TRIG 234.0* 09/07/2014 1418   HDL 62.10 09/07/2014 1418   CHOLHDL 3  09/07/2014 1418   VLDL 46.8* 09/07/2014 1418    - Last HbA1c: Lab Results  Component Value Date   HGBA1C 4.9 09/07/2014    Pt has FH of DM in grandfather.  FH of PCOS/hirsutism in none.  Review of Systems: [x]  complains of  [  ] denies General:   [  ] Recent weight change [ x ] Fatigue  [  ] Loss of appetite Eyes: [  ]  Vision Difficulty [  ]  Eye pain ENT: [  ]  Hearing difficulty [  ]  Difficulty Swallowing CVS: [  ] Chest pain [  ]  Palpitations/Irregular Heart beat [  ]  Shortness of breath lying flat [  ] Swelling of legs Resp: [  ] Frequent Cough [  ] Shortness of Breath  [  ]  Wheezing GI: [  ] Heartburn  [  ] Nausea or Vomiting  [  ] Diarrhea [  ] Constipation  [  ] Abdominal Pain GU: [  ]  Polyuria  [ x ]  nocturia Bones/joints:  [  ]  Muscle aches  [  ] Joint Pain  [  ] Bone pain Skin/Hair/Nails: [  ]  Rash  [  ] New stretch marks [  ]  Itching [  ] Hair loss [ x ]  Excessive hair growth Reproduction: [  ] Low sexual  desire , [ x ]  Women: Menstrual cycle problems [  ]  Women: Breast Discharge [  ] Men: Difficulty with erections [  ]  Men: Enlarged Breasts CNS: [  ] Frequent Headaches [  ] Blurry vision [  ] Tremors [  ] Seizures [  ] Loss of consciousness [  ] Localized weakness Endocrine: [ x ]  Excess thirst [  x]  Feeling excessively hot [  ]  Feeling excessively cold Heme: [  ]  Easy bruising [  ]  Enlarged glands or lumps in neck Allergy: [  ]  Food allergies [  ] Environmental allergies    PE: BP 162/94 mmHg  Pulse 99  Resp 14  Ht 5' 4.5" (1.638 m)  Wt 183 lb (83.008 kg)  BMI 30.94 kg/m2  SpO2 98%  LMP  (Approximate) Wt Readings from Last 3 Encounters:  09/22/14 183 lb (83.008 kg)  09/07/14 183 lb 12.8 oz (83.371 kg)  08/23/14 183 lb (83.008 kg)   Body mass index is 30.94 kg/(m^2).  Constitutional: overweight, in NAD, no full supraclavicular fat pads Eyes: PERRLA, EOMI, no exophthalmos ENT: moist mucous membranes, no thyromegaly, no cervical  lymphadenopathy Cardiovascular: RRR, No MRG Respiratory: CTA B Gastrointestinal: abdomen soft, NT, ND, BS+ Musculoskeletal: no deformities, strength intact in all 4 Skin: moist, warm;  No acne on face, + dark terminal hair on chin, central abdomen, no skin tags, no acanthosis nigricans, no purple, wide, stretch marks Neurological: no tremor with outstretched hands, DTR normal in all 4  ASSESSMENT: 1. ?PCOS, elevated Testosterone/hirsutism 2. HTN  Problem List Items Addressed This Visit      Cardiovascular and Mediastinum   HTN (hypertension)    encouaraged compliance with recently added Amlodipine. She has follow up with PCP in few weeks.         Musculoskeletal and Integument   Female hirsutism - Primary    Likely from elevated Testosterone. Continue manual methods of hair removal at this time.       Relevant Medications   dexamethasone (DECADRON) tablet   Other Relevant Orders   Prolactin   DHEA-sulfate   Cortisol   Basic metabolic panel     Other   Elevated testosterone level in female    We discussed about causes of elevated Testosterone in her age group including most likely PCOS, subclinical Cushing's , adrenal adenoma, hyperPRL.   Would like her to come back this week and will test morning cortisol after overnight 1 mg dex, PRL, BMP, DHEAS.   If the above workup is negative, then she most likely has PCOS. Encouraged her to follow healthy lifestyle.  Currently, she is experiencing hirsutism, elevated Testosterone and some irreg in menses.  We discussed various treatments for her symptoms.  Based On labs , will decide of whether to initiate Ortho-tri-cyclenlo ( pt preference towards starting OCPs) or Spirinolactone. I am little hesistant to start OCPs due to her smoking status, age , FH breast cancer.        Relevant Medications   dexamethasone (DECADRON) tablet   Other Relevant Orders   Prolactin   DHEA-sulfate   Cortisol   Basic metabolic panel     She  will also discuss Chantix with her PCP at [facility] appointment.  RTC 3 months  Neev [facility]  09/22/2014 12:14 PM

## 2014-09-23 ENCOUNTER — Other Ambulatory Visit (INDEPENDENT_AMBULATORY_CARE_PROVIDER_SITE_OTHER): Payer: BLUE CROSS/BLUE SHIELD

## 2014-09-23 ENCOUNTER — Encounter: Payer: Self-pay | Admitting: Endocrinology

## 2014-09-23 DIAGNOSIS — L680 Hirsutism: Secondary | ICD-10-CM

## 2014-09-23 LAB — CORTISOL: Cortisol, Plasma: 1.2 ug/dL

## 2014-09-23 LAB — BASIC METABOLIC PANEL
BUN: 10 mg/dL (ref 6–23)
CO2: 26 mEq/L (ref 19–32)
Calcium: 9.7 mg/dL (ref 8.4–10.5)
Chloride: 103 mEq/L (ref 96–112)
Creatinine, Ser: 0.79 mg/dL (ref 0.40–1.20)
GFR: 84.35 mL/min (ref >60.00)
Glucose, Bld: 102 mg/dL — ABNORMAL HIGH (ref 70–99)
Potassium: 4.5 mEq/L (ref 3.5–5.1)
Sodium: 138 mEq/L (ref 135–145)

## 2014-09-24 ENCOUNTER — Telehealth: Payer: Self-pay | Admitting: Endocrinology

## 2014-09-24 ENCOUNTER — Other Ambulatory Visit: Payer: Self-pay | Admitting: Endocrinology

## 2014-09-24 DIAGNOSIS — R7989 Other specified abnormal findings of blood chemistry: Secondary | ICD-10-CM

## 2014-09-24 DIAGNOSIS — L680 Hirsutism: Secondary | ICD-10-CM

## 2014-09-24 LAB — PROLACTIN: Prolactin: 4.4 ng/mL

## 2014-09-24 LAB — DHEA-SULFATE: DHEA-SO4: 102 ug/dL (ref 19–231)

## 2014-09-24 MED ORDER — SPIRONOLACTONE 25 MG PO TABS: 25.0000 mg | ORAL_TABLET | Freq: Every day | ORAL | Status: DC

## 2014-09-24 NOTE — Telephone Encounter (Signed)
The patient states it is okay to call in the new medication ( Spironolactone).

## 2014-09-24 NOTE — Telephone Encounter (Signed)
Please advise 

## 2014-09-24 NOTE — Telephone Encounter (Signed)
Noted, script sent to pharmacy

## 2014-10-12 ENCOUNTER — Encounter: Payer: Self-pay | Admitting: Nurse Practitioner

## 2014-10-12 ENCOUNTER — Other Ambulatory Visit (INDEPENDENT_AMBULATORY_CARE_PROVIDER_SITE_OTHER): Payer: BLUE CROSS/BLUE SHIELD

## 2014-10-12 ENCOUNTER — Ambulatory Visit (INDEPENDENT_AMBULATORY_CARE_PROVIDER_SITE_OTHER): Payer: BLUE CROSS/BLUE SHIELD | Admitting: Nurse Practitioner

## 2014-10-12 VITALS — BP 138/80 | HR 99 | Temp 98.1°F | Resp 14 | Ht 64.5 in | Wt 180.4 lb

## 2014-10-12 DIAGNOSIS — L680 Hirsutism: Secondary | ICD-10-CM

## 2014-10-12 DIAGNOSIS — E349 Endocrine disorder, unspecified: Secondary | ICD-10-CM | POA: Diagnosis not present

## 2014-10-12 DIAGNOSIS — I10 Essential (primary) hypertension: Secondary | ICD-10-CM

## 2014-10-12 DIAGNOSIS — R7989 Other specified abnormal findings of blood chemistry: Secondary | ICD-10-CM

## 2014-10-12 MED ORDER — METOPROLOL SUCCINATE ER 50 MG PO TB24: 50.0000 mg | ORAL_TABLET | Freq: Every day | ORAL | Status: DC

## 2014-10-12 NOTE — Progress Notes (Signed)
   Subjective:    Patient ID: Carly [name], female    DOB: 02-01-1971, 44 y.o.   MRN: [id number]  HPI  Carly [name] is a 43 yo female following up of HTN.   Wt Readings from Last 3 Encounters:  10/12/14 180 lb 6.4 oz (81.829 kg)  09/22/14 183 lb (83.008 kg)  09/07/14 183 lb 12.8 oz (83.371 kg)  Feels the best she has felt in years she reports!   Seeing Dr. Howell Rucks for female hirsutism.   Review of Systems  Constitutional: Negative for fever, chills, diaphoresis and fatigue.  Eyes: Negative for visual disturbance.  Respiratory: Negative for chest tightness, shortness of breath and wheezing.   Cardiovascular: Negative for chest pain, palpitations and leg swelling.  Gastrointestinal: Negative for nausea, vomiting, diarrhea and rectal pain.  Skin: Negative for rash.  Neurological: Negative for dizziness, weakness, numbness and headaches.  Psychiatric/Behavioral: The patient is not nervous/anxious.       Objective:   Physical Exam  Constitutional: She is oriented to person, place, and time. She appears well-developed and well-nourished. No distress.  BP 138/80 mmHg  Pulse 99  Temp(Src) 98.1 F (36.7 C) (Oral)  Resp 14  Ht 5' 4.5" (1.638 m)  Wt 180 lb 6.4 oz (81.829 kg)  BMI 30.50 kg/m2  SpO2 97%  LMP 08/23/2014 (Approximate)   HENT:  Head: Normocephalic and atraumatic.  Right Ear: External ear normal.  Left Ear: External ear normal.  Cardiovascular: Normal rate, regular rhythm, normal heart sounds and intact distal pulses.  Exam reveals no gallop and no friction rub.   No murmur heard. Pulmonary/Chest: Effort normal and breath sounds normal. No respiratory distress. She has no wheezes. She has no rales. She exhibits no tenderness.  Neurological: She is alert and oriented to person, place, and time. No cranial nerve deficit. She exhibits normal muscle tone. Coordination normal.  Skin: Skin is warm and dry. No rash noted. She is not diaphoretic.  Psychiatric: She has a normal  mood and affect. Her behavior is normal. Judgment and thought content normal.       Assessment & Plan:

## 2014-10-13 LAB — BASIC METABOLIC PANEL
BUN: 10 mg/dL (ref 6–23)
CO2: 23 mEq/L (ref 19–32)
Calcium: 9.2 mg/dL (ref 8.4–10.5)
Chloride: 105 mEq/L (ref 96–112)
Creatinine, Ser: 0.81 mg/dL (ref 0.40–1.20)
GFR: 81.93 mL/min (ref >60.00)
Glucose, Bld: 94 mg/dL (ref 70–99)
Potassium: 3.7 mEq/L (ref 3.5–5.1)
Sodium: 136 mEq/L (ref 135–145)

## 2014-10-14 ENCOUNTER — Encounter: Payer: Self-pay | Admitting: Endocrinology

## 2014-10-16 NOTE — Assessment & Plan Note (Signed)
Taking amlodipine. Feeling great she reports.

## 2014-10-28 ENCOUNTER — Encounter: Payer: Self-pay | Admitting: Nurse Practitioner

## 2014-11-08 ENCOUNTER — Encounter: Payer: Self-pay | Admitting: Nurse Practitioner

## 2014-11-08 ENCOUNTER — Telehealth: Payer: Self-pay | Admitting: *Deleted

## 2014-11-08 ENCOUNTER — Other Ambulatory Visit: Payer: Self-pay | Admitting: Nurse Practitioner

## 2014-11-08 NOTE — Telephone Encounter (Signed)
Noted. Thanks.

## 2014-11-15 ENCOUNTER — Encounter: Payer: Self-pay | Admitting: Nurse Practitioner

## 2014-11-15 ENCOUNTER — Ambulatory Visit (INDEPENDENT_AMBULATORY_CARE_PROVIDER_SITE_OTHER): Payer: BLUE CROSS/BLUE SHIELD | Admitting: Nurse Practitioner

## 2014-11-15 DIAGNOSIS — J209 Acute bronchitis, unspecified: Secondary | ICD-10-CM

## 2014-11-15 MED ORDER — GUAIFENESIN-CODEINE 100-10 MG/5ML PO SOLN: 5.0000 mL | Freq: Three times a day (TID) | ORAL | Status: DC | PRN

## 2014-11-15 MED ORDER — LEVOFLOXACIN 750 MG PO TABS: 750.0000 mg | ORAL_TABLET | Freq: Every day | ORAL | Status: DC

## 2014-11-15 NOTE — Progress Notes (Signed)
   Subjective:    Patient ID: DESIREA MIZRAHI, female    DOB: 11/23/1970, 44 y.o.   MRN: [id number]  HPI  Ms. Nesbitt is a 44 yo female with a CC of cough, aches, chest congestion, and fever.   1) Pt denies needing EKG since she feels the pressure is due to coughing.  Mucinex- not helpful   Review of Systems  Constitutional: Positive for fever, chills, diaphoresis and fatigue.  HENT: Positive for ear pain, rhinorrhea, sinus pressure and sneezing. Negative for postnasal drip and sore throat.   Eyes: Negative for visual disturbance.  Respiratory: Positive for chest tightness, shortness of breath and wheezing.   Cardiovascular: Negative for chest pain, palpitations and leg swelling.  Gastrointestinal: Negative for nausea, vomiting and diarrhea.  Skin: Negative for rash.      Objective:   Physical Exam  Constitutional: She is oriented to person, place, and time. She appears well-developed and well-nourished. No distress.  BP 140/80 mmHg  Pulse 137  Temp(Src) 99.2 F (37.3 C) (Oral)  Ht 5' 4.5" (1.638 m)  Wt 182 lb (82.555 kg)  BMI 30.77 kg/m2  SpO2 97%   HENT:  Head: Normocephalic and atraumatic.  Right Ear: External ear normal.  Left Ear: External ear normal.  TM's clear, canals red  Eyes: EOM are normal. Pupils are equal, round, and reactive to light. Right eye exhibits no discharge. Left eye exhibits no discharge. No scleral icterus.  Cardiovascular: Normal rate and regular rhythm.   Pulmonary/Chest: Effort normal. No respiratory distress. She has wheezes. She has no rales. She exhibits no tenderness.  Rhonchi  Neurological: She is alert and oriented to person, place, and time. No cranial nerve deficit. She exhibits normal muscle tone. Coordination normal.  Skin: Skin is warm. No rash noted. She is diaphoretic.  Psychiatric: She has a normal mood and affect. Her behavior is normal. Judgment and thought content normal.      Assessment & Plan:

## 2014-11-15 NOTE — Patient Instructions (Signed)
Call if you are not improving after antibiotics.   Please take a probiotic ( Align, Floraque or Culturelle) while you are on the antibiotic to prevent a serious antibiotic associated diarrhea  Called clostirudium dificile colitis and a vaginal yeast infection.

## 2014-11-15 NOTE — Progress Notes (Signed)
Pre visit review using our clinic review tool, if applicable. No additional management support is needed unless otherwise documented below in the visit note. 

## 2014-11-15 NOTE — Assessment & Plan Note (Addendum)
Cannot r/out CAP on top of bronchitis due to multiple sounds on exam. Will try Levaquin daily for 7 days, sent script for robitussin [facility] with pt and verbalized understanding. FU prn worsening/failure to improve.  Encouraged probiotic use.  Deferred EKG because of symptoms and nature of illness. Pt does not feel it is needed either at this time.

## 2014-11-16 ENCOUNTER — Encounter: Payer: Self-pay | Admitting: Nurse Practitioner

## 2014-11-21 ENCOUNTER — Telehealth: Payer: Self-pay | Admitting: Family

## 2014-11-21 DIAGNOSIS — R05 Cough: Secondary | ICD-10-CM

## 2014-11-21 DIAGNOSIS — R059 Cough, unspecified: Secondary | ICD-10-CM

## 2014-11-21 MED ORDER — BENZONATATE 100 MG PO CAPS: 100.0000 mg | ORAL_CAPSULE | Freq: Three times a day (TID) | ORAL | Status: DC | PRN

## 2014-11-21 NOTE — Progress Notes (Signed)
We are sorry that you are not feeling well.  Here is how we plan to help!  Based on what you have shared with me it looks like you have upper respiratory tract inflammation that has resulted in a significant cough.  Inflammation and infection in the upper respiratory tract is commonly called bronchitis and has four common causes:  Allergies, Viral Infections, Acid Reflux and Bacterial Infections.  Allergies, viruses and acid reflux are treated by controlling symptoms or eliminating the cause. An example might be a cough caused by taking certain blood pressure medications. You stop the cough by changing the medication. Another example might be a cough caused by acid reflux. Controlling the reflux helps control the cough.  Based on your presentation I believe you most likely have A cough due to bacteria.  When patients have a fever and a productive cough with a change in color or increased sputum production, we are concerned about bacterial bronchitis.  If left untreated it can progress to pneumonia.  If your symptoms do not improve with your treatment plan it is important that you contact your provider.   I see that you are on Levaquin currently and it is important that we do not add further antibiotics at this time. You may be experiencing some residual effects from your illness. I would like to help you treat the cough and symptoms. Please continue the Levaquin until it runs out.    In addition you may use A non-prescription cough medication called Robitussin DAC. Take 2 teaspoons every 8 hours or Delsym: take 2 teaspoons every 12 hours., A non-prescription cough medication called Mucinex DM: take 2 tablets every 12 hours. and A prescription cough medication called Tessalon Perles 100mg . You may take 1-2 capsules every 8 hours as needed for your cough.    HOME CARE . Only take medications as instructed by your medical team. . Complete the entire course of an antibiotic. . Drink plenty of fluids and get  plenty of rest. . Avoid close contacts especially the very young and the elderly . Cover your mouth if you cough or cough into your sleeve. . Always remember to wash your hands . A steam or ultrasonic humidifier can help congestion.    GET HELP RIGHT AWAY IF: . You develop worsening fever. . You become short of breath . You cough up blood. . Your symptoms persist after you have completed your treatment plan MAKE SURE YOU   Understand these instructions.  Will watch your condition.  Will get help right away if you are not doing well or get worse.  Your e-visit answers were reviewed by a board certified advanced clinical practitioner to complete your personal care plan.  Depending on the condition, your plan could have included both over the counter or prescription medications.  If there is a problem please reply  once you have received a response from your provider.  Your safety is important to [place].  If you have drug allergies check your prescription carefully.    You can use MyChart to ask questions about today's visit, request a non-urgent call back, or ask for a work or school excuse.  You will get an e-mail in the next two days asking about your experience.  I hope that your e-visit has been valuable and will speed your recovery. Thank you for using e-visits.

## 2014-11-24 ENCOUNTER — Other Ambulatory Visit: Payer: Self-pay | Admitting: Nurse Practitioner

## 2014-11-25 ENCOUNTER — Encounter: Payer: Self-pay | Admitting: Nurse Practitioner

## 2014-11-26 ENCOUNTER — Inpatient Hospital Stay
Admission: EM | Admit: 2014-11-26 | Discharge: 2014-11-27 | DRG: 190 | Disposition: A | Discharge status: Home / Self Care | Payer: BLUE CROSS/BLUE SHIELD | Attending: Internal Medicine | Admitting: Internal Medicine

## 2014-11-26 ENCOUNTER — Ambulatory Visit (INDEPENDENT_AMBULATORY_CARE_PROVIDER_SITE_OTHER): Payer: BLUE CROSS/BLUE SHIELD | Admitting: Nurse Practitioner

## 2014-11-26 ENCOUNTER — Telehealth: Payer: Self-pay | Admitting: Nurse Practitioner

## 2014-11-26 ENCOUNTER — Emergency Department: Payer: BLUE CROSS/BLUE SHIELD

## 2014-11-26 ENCOUNTER — Encounter: Payer: Self-pay | Admitting: Nurse Practitioner

## 2014-11-26 ENCOUNTER — Encounter: Payer: Self-pay | Admitting: Emergency Medicine

## 2014-11-26 VITALS — BP 132/74 | HR 122 | Temp 98.2°F | Resp 20 | Ht 64.5 in | Wt 182.8 lb

## 2014-11-26 DIAGNOSIS — J441 Chronic obstructive pulmonary disease with (acute) exacerbation: Secondary | ICD-10-CM | POA: Diagnosis present

## 2014-11-26 DIAGNOSIS — I10 Essential (primary) hypertension: Secondary | ICD-10-CM | POA: Diagnosis present

## 2014-11-26 DIAGNOSIS — J209 Acute bronchitis, unspecified: Secondary | ICD-10-CM | POA: Diagnosis present

## 2014-11-26 DIAGNOSIS — F17210 Nicotine dependence, cigarettes, uncomplicated: Secondary | ICD-10-CM | POA: Diagnosis present

## 2014-11-26 DIAGNOSIS — R6510 Systemic inflammatory response syndrome (SIRS) of non-infectious origin without acute organ dysfunction: Secondary | ICD-10-CM

## 2014-11-26 DIAGNOSIS — J440 Chronic obstructive pulmonary disease with acute lower respiratory infection: Principal | ICD-10-CM | POA: Diagnosis present

## 2014-11-26 DIAGNOSIS — F419 Anxiety disorder, unspecified: Secondary | ICD-10-CM | POA: Diagnosis present

## 2014-11-26 DIAGNOSIS — J9600 Acute respiratory failure, unspecified whether with hypoxia or hypercapnia: Secondary | ICD-10-CM | POA: Diagnosis present

## 2014-11-26 HISTORY — DX: Other specified abnormal findings of blood chemistry: R79.89

## 2014-11-26 LAB — BASIC METABOLIC PANEL
Anion gap: 14 (ref 5–15)
BUN: 8 mg/dL (ref 6–20)
CO2: 21 mmol/L — ABNORMAL LOW (ref 22–32)
Calcium: 9 mg/dL (ref 8.9–10.3)
Chloride: 102 mmol/L (ref 101–111)
Creatinine, Ser: 0.77 mg/dL (ref 0.44–1.00)
GFR calc Af Amer: >60 mL/min (ref >60)
GFR calc non Af Amer: >60 mL/min (ref >60)
Glucose, Bld: 99 mg/dL (ref 65–99)
Potassium: 3.7 mmol/L (ref 3.5–5.1)
Sodium: 137 mmol/L (ref 135–145)

## 2014-11-26 LAB — MAGNESIUM: Magnesium: 1.5 mg/dL — ABNORMAL LOW (ref 1.7–2.4)

## 2014-11-26 LAB — CBC
HCT: 45.9 % (ref 35.0–47.0)
HCT: 42 % (ref 35.0–47.0)
Hemoglobin: 15.3 g/dL (ref 12.0–16.0)
Hemoglobin: 14 g/dL (ref 12.0–16.0)
MCH: 32.2 pg (ref 26.0–34.0)
MCH: 32.4 pg (ref 26.0–34.0)
MCHC: 33.4 g/dL (ref 32.0–36.0)
MCHC: 33.2 g/dL (ref 32.0–36.0)
MCV: 96.5 fL (ref 80.0–100.0)
MCV: 97.6 fL (ref 80.0–100.0)
Platelets: 148 10*3/uL — ABNORMAL LOW (ref 150–440)
Platelets: 113 10*3/uL — ABNORMAL LOW (ref 150–440)
RBC: 4.76 MIL/uL (ref 3.80–5.20)
RBC: 4.31 MIL/uL (ref 3.80–5.20)
RDW: 13.5 % (ref 11.5–14.5)
RDW: 13.4 % (ref 11.5–14.5)
WBC: 21.1 10*3/uL — ABNORMAL HIGH (ref 3.6–11.0)
WBC: 18.7 10*3/uL — ABNORMAL HIGH (ref 3.6–11.0)

## 2014-11-26 LAB — CREATININE, SERUM
Creatinine, Ser: 0.86 mg/dL (ref 0.44–1.00)
GFR calc Af Amer: >60 mL/min (ref >60)
GFR calc non Af Amer: >60 mL/min (ref >60)

## 2014-11-26 LAB — TROPONIN I: Troponin I: <0.03 ng/mL (ref <0.031)

## 2014-11-26 MED ORDER — IBUPROFEN 400 MG PO TABS: 400.0000 mg | ORAL_TABLET | Freq: Four times a day (QID) | ORAL | Status: DC | PRN

## 2014-11-26 MED ORDER — BENZONATATE 100 MG PO CAPS
100.0000 mg | ORAL_CAPSULE | Freq: Three times a day (TID) | ORAL | Status: DC | PRN
  Administered 2014-11-26: 100 mg via ORAL
  Administered 2014-11-27: 200 mg via ORAL
  Filled 2014-11-26 (×2): qty 2

## 2014-11-26 MED ORDER — SODIUM CHLORIDE 0.9 % IJ SOLN
3.0000 mL | Freq: Two times a day (BID) | INTRAMUSCULAR | Status: DC
  Administered 2014-11-26: 22:00:00 3 mL via INTRAVENOUS

## 2014-11-26 MED ORDER — ALBUTEROL SULFATE (2.5 MG/3ML) 0.083% IN NEBU
10.0000 mg | INHALATION_SOLUTION | RESPIRATORY_TRACT | Status: AC
  Administered 2014-11-26: 10 mg via RESPIRATORY_TRACT

## 2014-11-26 MED ORDER — IPRATROPIUM-ALBUTEROL 0.5-2.5 (3) MG/3ML IN SOLN
3.0000 mL | Freq: Once | RESPIRATORY_TRACT | Status: AC
  Administered 2014-11-26: 14:00:00 via RESPIRATORY_TRACT

## 2014-11-26 MED ORDER — IPRATROPIUM-ALBUTEROL 0.5-2.5 (3) MG/3ML IN SOLN
3.0000 mL | RESPIRATORY_TRACT | Status: AC
  Administered 2014-11-26: 3 mL via RESPIRATORY_TRACT

## 2014-11-26 MED ORDER — AZITHROMYCIN 500 MG IV SOLR
INTRAVENOUS | Status: AC
  Filled 2014-11-26: qty 500

## 2014-11-26 MED ORDER — ENOXAPARIN SODIUM 40 MG/0.4ML ~~LOC~~ SOLN
40.0000 mg | SUBCUTANEOUS | Status: DC
  Administered 2014-11-26: 40 mg via SUBCUTANEOUS
  Filled 2014-11-26: qty 0.4

## 2014-11-26 MED ORDER — METHYLPREDNISOLONE SODIUM SUCC 125 MG IJ SOLR
60.0000 mg | Freq: Four times a day (QID) | INTRAMUSCULAR | Status: DC
  Administered 2014-11-26 – 2014-11-27 (×2): 60 mg via INTRAVENOUS
  Filled 2014-11-26 (×2): qty 2

## 2014-11-26 MED ORDER — ACETAMINOPHEN 650 MG RE SUPP: 650.0000 mg | Freq: Four times a day (QID) | RECTAL | Status: DC | PRN

## 2014-11-26 MED ORDER — ONDANSETRON HCL 4 MG/2ML IJ SOLN: 4.0000 mg | Freq: Four times a day (QID) | INTRAMUSCULAR | Status: DC | PRN

## 2014-11-26 MED ORDER — METHYLPREDNISOLONE SODIUM SUCC 125 MG IJ SOLR
INTRAMUSCULAR | Status: AC
  Administered 2014-11-26: 125 mg via INTRAVENOUS
  Filled 2014-11-26: qty 2

## 2014-11-26 MED ORDER — IPRATROPIUM-ALBUTEROL 0.5-2.5 (3) MG/3ML IN SOLN
RESPIRATORY_TRACT | Status: AC
  Filled 2014-11-26: qty 3

## 2014-11-26 MED ORDER — DEXTROSE 5 % IV SOLN
INTRAVENOUS | Status: AC
  Filled 2014-11-26: qty 10

## 2014-11-26 MED ORDER — DEXTROSE 5 % IV SOLN
500.0000 mg | Freq: Once | INTRAVENOUS | Status: AC
  Administered 2014-11-26: 500 mg via INTRAVENOUS

## 2014-11-26 MED ORDER — LEVALBUTEROL HCL 0.63 MG/3ML IN NEBU
0.6300 mg | INHALATION_SOLUTION | Freq: Four times a day (QID) | RESPIRATORY_TRACT | Status: DC
  Administered 2014-11-26 – 2014-11-27 (×3): 0.63 mg via RESPIRATORY_TRACT
  Filled 2014-11-26 (×10): qty 3

## 2014-11-26 MED ORDER — LEVOFLOXACIN IN D5W 500 MG/100ML IV SOLN
500.0000 mg | INTRAVENOUS | Status: DC
  Administered 2014-11-26: 500 mg via INTRAVENOUS
  Filled 2014-11-26 (×2): qty 100

## 2014-11-26 MED ORDER — SODIUM CHLORIDE 0.9 % IJ SOLN
3.0000 mL | Freq: Two times a day (BID) | INTRAMUSCULAR | Status: DC
  Administered 2014-11-26 – 2014-11-27 (×2): 3 mL via INTRAVENOUS

## 2014-11-26 MED ORDER — GUAIFENESIN ER 600 MG PO TB12
600.0000 mg | ORAL_TABLET | Freq: Two times a day (BID) | ORAL | Status: DC
  Administered 2014-11-26 – 2014-11-27 (×2): 600 mg via ORAL
  Filled 2014-11-26 (×2): qty 1

## 2014-11-26 MED ORDER — GUAIFENESIN 100 MG/5ML PO SOLN
5.0000 mL | ORAL | Status: DC | PRN
  Administered 2014-11-27: 02:00:00 100 mg via ORAL
  Filled 2014-11-26: qty 10

## 2014-11-26 MED ORDER — SERTRALINE HCL 50 MG PO TABS
25.0000 mg | ORAL_TABLET | Freq: Every day | ORAL | Status: DC
  Administered 2014-11-26 – 2014-11-27 (×2): 25 mg via ORAL
  Filled 2014-11-26 (×2): qty 1

## 2014-11-26 MED ORDER — SPIRONOLACTONE 25 MG PO TABS
25.0000 mg | ORAL_TABLET | Freq: Every day | ORAL | Status: DC
  Administered 2014-11-27: 09:00:00 25 mg via ORAL
  Filled 2014-11-26: qty 1

## 2014-11-26 MED ORDER — SODIUM CHLORIDE 0.9 % IJ SOLN: 3.0000 mL | INTRAMUSCULAR | Status: DC | PRN

## 2014-11-26 MED ORDER — METHYLPREDNISOLONE SODIUM SUCC 125 MG IJ SOLR
125.0000 mg | INTRAMUSCULAR | Status: AC
  Administered 2014-11-26: 125 mg via INTRAVENOUS

## 2014-11-26 MED ORDER — ALBUTEROL (5 MG/ML) CONTINUOUS INHALATION SOLN: 10.0000 mg/h | INHALATION_SOLUTION | RESPIRATORY_TRACT | Status: DC

## 2014-11-26 MED ORDER — VITAMIN B-12 1000 MCG PO TABS
1000.0000 ug | ORAL_TABLET | Freq: Every day | ORAL | Status: DC
  Administered 2014-11-27: 1000 ug via ORAL
  Filled 2014-11-26: qty 1

## 2014-11-26 MED ORDER — SODIUM CHLORIDE 0.9 % IV BOLUS (SEPSIS)
1000.0000 mL | INTRAVENOUS | Status: AC
  Administered 2014-11-26: 1000 mL via INTRAVENOUS

## 2014-11-26 MED ORDER — ACETAMINOPHEN 325 MG PO TABS: 650.0000 mg | ORAL_TABLET | Freq: Four times a day (QID) | ORAL | Status: DC | PRN

## 2014-11-26 MED ORDER — ALBUTEROL SULFATE (2.5 MG/3ML) 0.083% IN NEBU
INHALATION_SOLUTION | RESPIRATORY_TRACT | Status: AC
  Filled 2014-11-26: qty 3

## 2014-11-26 MED ORDER — MOMETASONE FURO-FORMOTEROL FUM 100-5 MCG/ACT IN AERO
2.0000 | INHALATION_SPRAY | Freq: Two times a day (BID) | RESPIRATORY_TRACT | Status: DC
  Administered 2014-11-26 – 2014-11-27 (×2): 2 via RESPIRATORY_TRACT
  Filled 2014-11-26: qty 8.8

## 2014-11-26 MED ORDER — CLONAZEPAM 0.5 MG PO TABS
0.5000 mg | ORAL_TABLET | Freq: Every day | ORAL | Status: DC
  Administered 2014-11-26: 0.5 mg via ORAL
  Filled 2014-11-26: qty 1

## 2014-11-26 MED ORDER — ALUM & MAG HYDROXIDE-SIMETH 200-200-20 MG/5ML PO SUSP: 30.0000 mL | Freq: Four times a day (QID) | ORAL | Status: DC | PRN

## 2014-11-26 MED ORDER — SODIUM CHLORIDE 0.9 % IV BOLUS (SEPSIS)
1500.0000 mL | INTRAVENOUS | Status: AC
  Administered 2014-11-26: 1500 mL via INTRAVENOUS

## 2014-11-26 MED ORDER — SODIUM CHLORIDE 0.9 % IV SOLN: 250.0000 mL | INTRAVENOUS | Status: DC | PRN

## 2014-11-26 MED ORDER — IPRATROPIUM-ALBUTEROL 0.5-2.5 (3) MG/3ML IN SOLN
RESPIRATORY_TRACT | Status: AC
  Filled 2014-11-26: qty 6

## 2014-11-26 MED ORDER — NICOTINE 21 MG/24HR TD PT24
21.0000 mg | MEDICATED_PATCH | Freq: Every day | TRANSDERMAL | Status: DC
  Administered 2014-11-26 – 2014-11-27 (×2): 21 mg via TRANSDERMAL
  Filled 2014-11-26 (×2): qty 1

## 2014-11-26 MED ORDER — METOPROLOL SUCCINATE ER 25 MG PO TB24
50.0000 mg | ORAL_TABLET | Freq: Every day | ORAL | Status: DC
  Administered 2014-11-27: 50 mg via ORAL
  Filled 2014-11-26 (×2): qty 2

## 2014-11-26 MED ORDER — DEXTROSE 5 % IV SOLN
1.0000 g | INTRAVENOUS | Status: AC
  Administered 2014-11-26: 1 g via INTRAVENOUS

## 2014-11-26 MED ORDER — ONDANSETRON HCL 4 MG PO TABS: 4.0000 mg | ORAL_TABLET | Freq: Four times a day (QID) | ORAL | Status: DC | PRN

## 2014-11-26 MED ORDER — AMLODIPINE BESYLATE 5 MG PO TABS
5.0000 mg | ORAL_TABLET | Freq: Every day | ORAL | Status: DC
  Administered 2014-11-27: 5 mg via ORAL
  Filled 2014-11-26: qty 1

## 2014-11-26 NOTE — ED Notes (Addendum)
Pt with shortness of breath started last night. Recently dx with bronchitis and has finished a round of antibiotics. No use of nebs or inhalers. expiratory wheezing audible in triage.

## 2014-11-26 NOTE — Plan of Care (Signed)
Problem: Discharge Progression Outcomes Goal: Discharge plan in place and appropriate Outcome: Progressing From home lives husband, smoker smokes 1 pack per day and vapes, history of emphysema, history of anxiety, HTN controlled by home meds, recently completed ABT for bronchitis with no improvement, sx worsen wheezes cough and SOB came to ED Goal: Other Discharge Outcomes/Goals Outcome: Progressing Plan of care progress:  Respiratory insuff: -acute bronchitis with COPD -solumedrol, breathing txs with improvement in wheezing and SOB -dyspnea on exertion -no complaints of pain, no distress or discomfort noted -monitor labs -ABTs continue

## 2014-11-26 NOTE — H&P (Signed)
[facility] NAME: Carly [name]    MR#:  [id number]  DATE OF BIRTH:  May 23, 1971  DATE OF ADMISSION:  11/26/2014  PRIMARY CARE PHYSICIAN: Rubbie Battiest, NP   REQUESTING/REFERRING PHYSICIAN: Marcos Eke  CHIEF COMPLAINT:   Chief Complaint  Patient presents with  . Shortness of Breath    HISTORY OF PRESENT ILLNESS: Carly [name]  is a 44 y.o. female with a known history of nicotine addiction hypertension anxiety disorder who states that she started having shortness of breath more than 2 weeks ago she was seen by her primary care provider on May 2 and was treated for bronchitis with Levaquin and guaifenesin. She continued to have shortness of breath cough and tightness in her chest. Patient's symptoms got worst, due to that she came to the ER today. Patient was very tachycardic and had accessory muscle usage and wheezing. She has received nebulizer treatments. She still feels short of breath. She has not had any fevers or chills. Denies any nausea vomiting or diarrhea  PAST MEDICAL HISTORY:   Past Medical History  Diagnosis Date  . Chicken pox   . Emphysema of lung   . Frequent headaches   . Hay fever   . Hypertension   . Anxiety   . Elevated testosterone level in female     PAST SURGICAL HISTORY:  Past Surgical History  Procedure Laterality Date  . Tubal ligation  1997  . Tubal ligation      SOCIAL HISTORY:  History  Substance Use Topics  . Smoking status: Current Every Day Smoker -- 1.50 packs/day    Types: Cigarettes  . Smokeless tobacco: Never Used     Comment: Wants to try Nicorette gum   . Alcohol Use: 4.8 oz/week    8 Standard drinks or equivalent per week     Comment: Beer (Can)     FAMILY HISTORY:  Family History  Problem Relation Age of Onset  . Hyperlipidemia Mother   . Heart disease Mother   . Stroke Mother   . Depression Mother   . Mental illness Mother   . Hyperlipidemia Father   . Depression  Father   . Mental illness Father   . Diabetes Paternal Grandfather   . Cancer Cousin     DRUG ALLERGIES: No Known Allergies  REVIEW OF SYSTEMS:   CONSTITUTIONAL: No fever, fatigue or weakness.  EYES: No blurred or double vision.  EARS, NOSE, AND THROAT: No tinnitus or ear pain.  RESPIRATORY: Positive cough, shortness of breath, and wheezing no hemoptysis.  CARDIOVASCULAR: No chest pain, orthopnea, edema.  GASTROINTESTINAL: No nausea, vomiting, diarrhea or abdominal pain.  GENITOURINARY: No dysuria, hematuria.  ENDOCRINE: No polyuria, nocturia,  HEMATOLOGY: No anemia, easy bruising or bleeding SKIN: No rash or lesion. MUSCULOSKELETAL: No joint pain or arthritis.   NEUROLOGIC: No tingling, numbness, weakness.  PSYCHIATRY: No anxiety or depression.   MEDICATIONS AT HOME:  Prior to Admission medications   Medication Sig Start Date End Date Taking? Authorizing Provider  amLODipine (NORVASC) 5 MG tablet take 1 tablet by mouth once daily 11/24/14  Yes Rubbie Battiest, NP  benzonatate (TESSALON PERLES) 100 MG capsule Take 1-2 capsules (100-200 mg total) by mouth every 8 (eight) hours as needed for cough. 11/21/14 11/21/15 Yes Benjamine Mola, FNP  clonazePAM (KLONOPIN) 0.5 MG tablet Take 1 tablet (0.5 mg total) by mouth at bedtime. 09/07/14  Yes Rubbie Battiest, NP  ibuprofen (ADVIL,MOTRIN) 200 MG  tablet Take 400-600 mg by mouth every 6 (six) hours as needed for headache.   Yes Historical Provider, MD  metoprolol succinate (TOPROL-XL) 50 MG 24 hr tablet Take 1 tablet (50 mg total) by mouth daily. Take with or immediately following a meal. 10/12/14  Yes Rubbie Battiest, NP  sertraline (ZOLOFT) 25 MG tablet take 1 tablet by mouth once daily 11/08/14  Yes Rubbie Battiest, NP  spironolactone (ALDACTONE) 25 MG tablet Take 1 tablet (25 mg total) by mouth daily. 09/24/14  Yes Radhika Concha Se, MD  vitamin B-12 (CYANOCOBALAMIN) 1000 MCG tablet Take 1,000 mcg by mouth daily.   Yes Historical Provider, MD       PHYSICAL EXAMINATION:   VITAL SIGNS: Blood pressure 129/81, pulse 145, temperature 97.4 F (36.3 C), temperature source Tympanic, resp. rate 36, height 5\' 4"  (1.626 m), weight 82.555 kg (182 lb), last menstrual period 11/12/2014, SpO2 99 %.  GENERAL:  44 y.o.-year-old patient lying in the bed with no acute distress.  EYES: Pupils equal, round, reactive to light and accommodation. No scleral icterus. Extraocular muscles intact.  HEENT: Head atraumatic, normocephalic. Oropharynx and nasopharynx clear.  NECK:  Supple, no jugular venous distention. No thyroid enlargement, no tenderness.  LUNGS: Patient has some necessary muscle usage, and has active wheezing no rhonchi or rales CARDIOVASCULAR: S1, S2 tachycardic. No murmurs, rubs, or gallops.  ABDOMEN: Soft, nontender, nondistended. Bowel sounds present. No organomegaly or mass.  EXTREMITIES: No pedal edema, cyanosis, or clubbing.  NEUROLOGIC: Cranial nerves II through XII are intact. Muscle strength 5/5 in all extremities. Sensation intact. Gait not checked.  PSYCHIATRIC: The patient is alert and oriented x 3.  SKIN: No obvious rash, lesion, or ulcer.   LABORATORY PANEL:   CBC  Recent Labs Lab 11/26/14 1453  WBC 21.1*  HGB 15.3  HCT 45.9  PLT 148*  MCV 96.5  MCH 32.2  MCHC 33.4  RDW 13.5   ------------------------------------------------------------------------------------------------------------------  Chemistries   Recent Labs Lab 11/26/14 1453  NA 137  K 3.7  CL 102  CO2 21*  GLUCOSE 99  BUN 8  CREATININE 0.77  CALCIUM 9.0  MG 1.5*   ------------------------------------------------------------------------------------------------------------------ estimated creatinine clearance is 94.3 mL/min (by C-G formula based on Cr of 0.77). ------------------------------------------------------------------------------------------------------------------ No results for input(s): TSH, T4TOTAL, T3FREE, THYROIDAB in the last  72 hours.  Invalid input(s): FREET3   Coagulation profile No results for input(s): INR, PROTIME in the last 168 hours. ------------------------------------------------------------------------------------------------------------------- No results for input(s): DDIMER in the last 72 hours. -------------------------------------------------------------------------------------------------------------------  Cardiac Enzymes  Recent Labs Lab 11/26/14 1453  TROPONINI <0.03   ------------------------------------------------------------------------------------------------------------------ Invalid input(s): POCBNP  ---------------------------------------------------------------------------------------------------------------  Urinalysis No results found for: COLORURINE, APPEARANCEUR, LABSPEC, PHURINE, GLUCOSEU, HGBUR, BILIRUBINUR, KETONESUR, PROTEINUR, UROBILINOGEN, NITRITE, LEUKOCYTESUR   RADIOLOGY: Dg Chest 2 View  11/26/2014   CLINICAL DATA:  Diagnosed with bronchitis 10 days ago has been taking an antibiotic, Pt is not feeling better. C/o shortness of breath, non-productive cough. Smoker for 27 years ago.  EXAM: CHEST  2 VIEW  COMPARISON:  None.  FINDINGS: Normal heart, mediastinum and hila.  Clear lungs.  No pleural effusion or pneumothorax.  Bony thorax is intact.  IMPRESSION: No active cardiopulmonary disease.   Electronically Signed   By: Lajean Manes M.D.   On: 11/26/2014 15:13    EKG: Orders placed or performed during the hospital encounter of 11/26/14  . ED EKG  . ED EKG    IMPRESSION AND PLAN: Patient is a 44 year old with history of nicotine  addiction presents with shortness of breath and cough.   1. Acute respiratory failure due to possible underlying COPD exacerbation with acute bronchitis; at this time will treat her with nebulizers with Xopenex in light of her heart rate being elevated IV Solu-Medrol, and IV antibiotics. I have also started her on dulera. She will  need outpatient pulmonary follow-up and PFTs.  2. Hypertension continueToprol-XL and amlodipine as taking at home.  3. Zaidi disorder continue clonazepam and Zoloft as taking at home.  4. Nicotine addiction smoking cessation done strongly recommended patient stop smoking 4 minutes spent patient will be started on a nicotine patch she is receptive to starting nicotine patch.   All the records are reviewed and case discussed with ED provider. Management plans discussed with the patient, family and they are in agreement.  CODE STATUS:    TOTAL TIME TAKING CARE OF THIS PATIENT:  55 minutes minutes.    Dustin Flock M.D on 11/26/2014 at 5:11 PM  Between 7am to 6pm - Pager - [phone]  After 6pm go to [email or web address] - password [email or web address] [facility]  Office  [phone]  CC: Primary care physician; Rubbie Battiest, NP

## 2014-11-26 NOTE — Progress Notes (Signed)
Pre visit review using our clinic review tool, if applicable. No additional management support is needed unless otherwise documented below in the visit note. 

## 2014-11-26 NOTE — Telephone Encounter (Signed)
Patient Name: Carly [name] DOB: Apr 23, 1971 Initial Comment Caller states she was treated for bronchitis on 5/2, now having cough and tightness in chest. Nurse Assessment Nurse: Marcelline Deist, RN, Kermit Balo Date/Time (Eastern Time): 11/26/2014 10:09:35 AM Confirm and document reason for call. If symptomatic, describe symptoms. ---Caller states she was treated for bronchitis on 5/2, now having cough and tightness in chest/congestion. No SOB unless coughing very hard. No fever. Did have an E visit last Sunday night. Benzo... gel pill that numbs the lungs. helped until yesterday. Has the patient traveled out of the country within the last 30 days? ---Not Applicable Does the patient require triage? ---Yes Related visit to physician within the last 2 weeks? ---Yes Does the PT have any chronic conditions? (i.e. diabetes, asthma, etc.) ---Yes List chronic conditions. ---BP rx Did the patient indicate they were pregnant? ---No Guidelines Guideline Title Affirmed Question Affirmed Notes Cough - Acute Non-Productive SEVERE coughing spells (e.g., whooping sound after coughing, vomiting after coughing) Final Disposition User See Physician within [facility], RN, Kermit Balo Comments Caller can not recall full name of rx they gave her on 5/2, but states since yesterday has not helped & was coughing a lot during night, unable to sleep well.

## 2014-11-26 NOTE — ED Provider Notes (Signed)
[facility] Emergency Department Provider Note  ____________________________________________  Time seen: Approximately 3:11 PM  I have reviewed the triage vital signs and the nursing notes.   HISTORY  Chief Complaint Shortness of Breath    HPI Carly [name] is a 44 y.o. female with a history of occasional bronchitis, hypertension, and tobacco abuse presents with about 9 days of shortness of breath and wheezing.  She was seen last week at [facility] prescribed a 7 day course of Levaquin and some cough syrup.  She has not been taking steroids or using any inhalers.  She said that she felt better for several days, but yesterday started feeling much worse with increased work of breathing, feeling like she cannot take a deep breath, and increased fatigue.  She denies fever and chills, chest pain, abdominal pain, nausea/vomiting, and altered mental status.  She describes the shortness of breath is severe and constant.  Exertion makes it worse, and rest makes it somewhat better.  Past Medical History  Diagnosis Date  . Chicken pox   . Emphysema of lung   . Frequent headaches   . Hay fever   . Hypertension   . Anxiety     Patient Active Problem List   Diagnosis Date Noted  . Acute bronchitis 11/15/2014  . Female hirsutism 09/22/2014  . Elevated testosterone level in female 09/22/2014  . Routine general medical examination at a health care facility 09/10/2014  . Generalized anxiety disorder 08/23/2014  . Encounter to establish care 08/23/2014  . Tobacco use disorder 08/23/2014  . Obesity (BMI 30-39.9) 08/23/2014  . HTN (hypertension) 08/23/2014    Past Surgical History  Procedure Laterality Date  . Tubal ligation  1997  . Tubal ligation      Current Outpatient Rx  Name  Route  Sig  Dispense  Refill  . amLODipine (NORVASC) 5 MG tablet      take 1 tablet by mouth once daily   30 tablet   5   . benzonatate (TESSALON PERLES) 100 MG capsule    Oral   Take 1-2 capsules (100-200 mg total) by mouth every 8 (eight) hours as needed for cough.   30 capsule   0   . clonazePAM (KLONOPIN) 0.5 MG tablet   Oral   Take 1 tablet (0.5 mg total) by mouth at bedtime.   30 tablet   2   . metoprolol succinate (TOPROL-XL) 50 MG 24 hr tablet   Oral   Take 1 tablet (50 mg total) by mouth daily. Take with or immediately following a meal.   30 tablet   11   . sertraline (ZOLOFT) 25 MG tablet      take 1 tablet by mouth once daily   30 tablet   2   . spironolactone (ALDACTONE) 25 MG tablet   Oral   Take 1 tablet (25 mg total) by mouth daily.   30 tablet   3     Allergies Review of patient's allergies indicates no known allergies.  Family History  Problem Relation Age of Onset  . Hyperlipidemia Mother   . Heart disease Mother   . Stroke Mother   . Depression Mother   . Mental illness Mother   . Hyperlipidemia Father   . Depression Father   . Mental illness Father   . Diabetes Paternal Grandfather   . Cancer Cousin     Social History History  Substance Use Topics  . Smoking status: Current Every Day Smoker -- 1.50  packs/day    Types: Cigarettes  . Smokeless tobacco: Never Used     Comment: Wants to try Nicorette gum   . Alcohol Use: 4.8 oz/week    8 Standard drinks or equivalent per week     Comment: Beer (Can)     Review of Systems Constitutional: No fever/chills Eyes: No visual changes. ENT: No sore throat. Cardiovascular: Denies chest pain. Respiratory: Severe shortness of breath at rest Gastrointestinal: No abdominal pain.  No nausea, no vomiting.  No diarrhea.  No constipation. Genitourinary: Negative for dysuria. Musculoskeletal: Negative for back pain. Skin: Negative for rash. Neurological: Negative for headaches, focal weakness or numbness.  10-point ROS otherwise negative.  ____________________________________________   PHYSICAL EXAM:  VITAL SIGNS: ED Triage Vitals  Enc Vitals Group     BP  11/26/14 1420 164/100 mmHg     Pulse Rate 11/26/14 1420 130     Resp 11/26/14 1420 22     Temp 11/26/14 1420 97.4 F (36.3 C)     Temp Source 11/26/14 1420 Tympanic     SpO2 11/26/14 1420 94 %     Weight 11/26/14 1420 182 lb (82.555 kg)     Height 11/26/14 1420 5\' 4"  (1.626 m)     Head Cir --      Peak Flow --      Pain Score --      Pain Loc --      Pain Edu? --      Excl. in [place]? --     Constitutional: Alert and oriented.  Moderate respiratory distress. Eyes: Conjunctivae are normal. PERRL. EOMI. Head: Atraumatic. Nose: No congestion/rhinnorhea. Mouth/Throat: Dry mucous membranes .  Oropharynx non-erythematous. Neck: No stridor.   Cardiovascular: Tachycardia in the 120s to 130s before a DuoNeb, regular rhythm. Grossly normal heart sounds.  Good peripheral circulation. Respiratory: Increased respiratory effort and rate (respiratory rate is in the 30s), retractions, accessory muscles used.. Gastrointestinal: Soft and nontender. No distention. No abdominal bruits. No CVA tenderness. Musculoskeletal: No lower extremity tenderness nor edema.  No joint effusions. Neurologic:  Normal speech and language. No gross focal neurologic deficits are appreciated. Speech is normal. No gait instability. Skin:  Skin is warm, dry and intact. No rash noted. Psychiatric: Mood and affect are normal. Speech and behavior are normal except for speaking in short sentences due to respiratory distress.  ____________________________________________   LABS (all labs ordered are listed, but only abnormal results are displayed)  Labs Reviewed  BASIC METABOLIC PANEL - Abnormal; Notable for the following:    CO2 21 (*)    All other components within normal limits  CBC - Abnormal; Notable for the following:    WBC 21.1 (*)    Platelets 148 (*)    All other components within normal limits  MAGNESIUM - Abnormal; Notable for the following:    Magnesium 1.5 (*)                   All other components  within normal limits  CULTURE, BLOOD (ROUTINE X 2)  CULTURE, BLOOD (ROUTINE X 2)  TROPONIN I  CREATININE, SERUM  CBC  BASIC METABOLIC PANEL   ____________________________________________  EKG  ED ECG REPORT   Date: 11/26/2014  EKG Time: 15:39  Rate: 137  Rhythm: sinus tachycardia  Axis: Normal  Intervals:none  ST&T Change:   ____________________________________________  RADIOLOGY  Dg Chest 2 View  11/26/2014   CLINICAL DATA:  Diagnosed with bronchitis 10 days ago has been taking an antibiotic,  Pt is not feeling better. C/o shortness of breath, non-productive cough. Smoker for 27 years ago.  EXAM: CHEST  2 VIEW  COMPARISON:  None.  FINDINGS: Normal heart, mediastinum and hila.  Clear lungs.  No pleural effusion or pneumothorax.  Bony thorax is intact.  IMPRESSION: No active cardiopulmonary disease.   Electronically Signed   By: Lajean Manes M.D.   On: 11/26/2014 15:13    ____________________________________________   PROCEDURES  Procedure(s) performed: None  Critical Care performed: Yes, see critical care note(s)   CRITICAL CARE Performed by: Hinda Kehr   Total critical care time: 30 minutes  Critical care time was exclusive of separately billable procedures and treating other patients.  Critical care was necessary to treat or prevent imminent or life-threatening deterioration.  Critical care was time spent personally by me on the following activities: development of treatment plan with patient and/or surrogate as well as nursing, discussions with consultants, evaluation of patient's response to treatment, examination of patient, obtaining history from patient or surrogate, ordering and performing treatments and interventions, ordering and review of laboratory studies, ordering and review of radiographic studies, pulse oximetry and re-evaluation of patient's condition.   ____________________________________________   INITIAL IMPRESSION / ASSESSMENT AND PLAN /  ED COURSE  Pertinent labs & imaging results that were available during my care of the patient were reviewed by me and considered in my medical decision making (see chart for details).  The patient is in moderate to severe respiratory distress with abnormal vital signs that include tachypnea, tachycardia, and borderline hypoxemia on room air.  She has a leukocytosis of 21 and she has not been taking steroids recently.  However, she is afebrile and her chest x-ray is clear, and at this time she does not appear to be suffering from pneumonia which was refractory to her Levaquin.  I will treat her aggressively for an acute COPD exacerbation including a total of 3 DuoNeb, Solu-Medrol 125 mg IV, and 1 L normal saline IV bolus.  I will reassess after the duo nebs.  Given the amount of distress she is in, however, I anticipate she may need to be admitted to the hospital on continuous albuterol.  ----------------------------------------- 4:33 PM on 11/26/2014 -----------------------------------------  The patient feels a little bit better after her 3 duo nebs, but is still retracting, has wheezes and rhonchi throughout, his tachypnea In the 30s, and tachycardic in the 120s.  I think she would benefit from continuous albuterol and observation overnight.  We will discuss this with the hospitalist. ____________________________________________   FINAL CLINICAL IMPRESSION(S) / ED DIAGNOSES  Final diagnoses:  Acute exacerbation of chronic obstructive pulmonary disease (COPD)  SIRS (systemic inflammatory response syndrome)     Hinda Kehr, MD 11/26/14 2359

## 2014-11-26 NOTE — Progress Notes (Signed)
   Subjective:    Patient ID: Carly [name], female    DOB: Apr 03, 1971, 44 y.o.   MRN: [id number]  HPI  Carly [name] is having a hard time breathing, continuous dry coughing,  Switched to a vaping device recently with last use this morning. Hard to breathe. Having trouble with talking in full sentences. She is very willing to be taken to the ER by her Mother in law.   Review of Systems  Constitutional: Positive for diaphoresis and fatigue. Negative for fever and chills.  Respiratory: Positive for cough, chest tightness, shortness of breath and wheezing.   Cardiovascular: Negative for chest pain, palpitations and leg swelling.  Gastrointestinal: Negative for nausea, vomiting and diarrhea.  Skin: Negative for rash.      Objective:   Physical Exam  Constitutional: She is oriented to person, place, and time. She appears well-developed and well-nourished. No distress.  BP 132/74 mmHg  Pulse 122  Temp(Src) 98.2 F (36.8 C) (Oral)  Resp 20  Ht 5' 4.5" (1.638 m)  Wt 182 lb 12.8 oz (82.918 kg)  BMI 30.90 kg/m2  SpO2 91%  LMP 11/12/2014 (Approximate)   HENT:  Head: Normocephalic and atraumatic.  Right Ear: External ear normal.  Left Ear: External ear normal.  Cardiovascular: Normal rate, regular rhythm, normal heart sounds and intact distal pulses.  Exam reveals no gallop and no friction rub.   No murmur heard. Pulmonary/Chest: Effort normal and breath sounds normal. No respiratory distress. She has no wheezes. She has no rales. She exhibits no tenderness.  Inspiratory and expiratory wheezes  Neurological: She is alert and oriented to person, place, and time. No cranial nerve deficit. She exhibits normal muscle tone. Coordination normal.  Skin: Skin is warm and dry. No rash noted. She is not diaphoretic.  Psychiatric: She has a normal mood and affect. Her behavior is normal. Judgment and thought content normal.      Assessment & Plan:

## 2014-11-26 NOTE — Assessment & Plan Note (Signed)
Worsening. Suspect COPD exacerbation. Sending to [facility] ER due to inability to complete sentences, Highest sat was 91%. Misty Triage nurse was informed of impending arrival by POV. Mother in [place] is bringing her.

## 2014-11-26 NOTE — Telephone Encounter (Signed)
Carly [name] this patient is on your schedule at 2pm today.  Please advise.

## 2014-11-27 LAB — CBC
HCT: 40.9 % (ref 35.0–47.0)
Hemoglobin: 13.7 g/dL (ref 12.0–16.0)
MCH: 32.8 pg (ref 26.0–34.0)
MCHC: 33.6 g/dL (ref 32.0–36.0)
MCV: 97.8 fL (ref 80.0–100.0)
Platelets: 119 10*3/uL — ABNORMAL LOW (ref 150–440)
RBC: 4.18 MIL/uL (ref 3.80–5.20)
RDW: 13.6 % (ref 11.5–14.5)
WBC: 15.7 10*3/uL — ABNORMAL HIGH (ref 3.6–11.0)

## 2014-11-27 LAB — BASIC METABOLIC PANEL
Anion gap: 8 (ref 5–15)
BUN: 8 mg/dL (ref 6–20)
CO2: 22 mmol/L (ref 22–32)
Calcium: 8.5 mg/dL — ABNORMAL LOW (ref 8.9–10.3)
Chloride: 108 mmol/L (ref 101–111)
Creatinine, Ser: 0.6 mg/dL (ref 0.44–1.00)
GFR calc Af Amer: >60 mL/min (ref >60)
GFR calc non Af Amer: >60 mL/min (ref >60)
Glucose, Bld: 164 mg/dL — ABNORMAL HIGH (ref 65–99)
Potassium: 4.1 mmol/L (ref 3.5–5.1)
Sodium: 138 mmol/L (ref 135–145)

## 2014-11-27 MED ORDER — METHYLPREDNISOLONE 4 MG PO TBPK: ORAL_TABLET | ORAL | Status: DC

## 2014-11-27 MED ORDER — LEVOFLOXACIN 500 MG PO TABS: 500.0000 mg | ORAL_TABLET | Freq: Every day | ORAL | Status: DC

## 2014-11-27 MED ORDER — IPRATROPIUM-ALBUTEROL 0.5-2.5 (3) MG/3ML IN SOLN: 3.0000 mL | RESPIRATORY_TRACT | Status: DC | PRN

## 2014-11-27 MED ORDER — NICOTINE 21 MG/24HR TD PT24
21.0000 mg | MEDICATED_PATCH | Freq: Every day | TRANSDERMAL | Status: DC
Start: 2014-11-27 — End: 2015-01-05

## 2014-11-27 MED ORDER — GUAIFENESIN ER 600 MG PO TB12: 600.0000 mg | ORAL_TABLET | Freq: Two times a day (BID) | ORAL | Status: DC

## 2014-11-27 MED ORDER — MOMETASONE FURO-FORMOTEROL FUM 100-5 MCG/ACT IN AERO: 2.0000 | INHALATION_SPRAY | Freq: Two times a day (BID) | RESPIRATORY_TRACT | Status: DC

## 2014-11-27 NOTE — Discharge Instructions (Signed)
Asthma, Acute Bronchospasm °Acute bronchospasm caused by asthma is also referred to as an asthma attack. Bronchospasm means your air passages become narrowed. The narrowing is caused by inflammation and tightening of the muscles in the air tubes (bronchi) in your lungs. This can make it hard to breathe or cause you to wheeze and cough. °CAUSES °Possible triggers are: °· Animal dander from the skin, hair, or feathers of animals. °· Dust mites contained in house dust. °· Cockroaches. °· Pollen from trees or grass. °· Mold. °· Cigarette or tobacco smoke. °· Air pollutants such as dust, household cleaners, hair sprays, aerosol sprays, paint fumes, strong chemicals, or strong odors. °· Cold air or weather changes. Cold air may trigger inflammation. Winds increase molds and pollens in the air. °· Strong emotions such as crying or laughing hard. °· Stress. °· Certain medicines such as aspirin or beta-blockers. °· Sulfites in foods and drinks, such as dried fruits and wine. °· Infections or inflammatory conditions, such as a flu, cold, or inflammation of the nasal membranes (rhinitis). °· Gastroesophageal reflux disease (GERD). GERD is a condition where stomach acid backs up into your esophagus. °· Exercise or strenuous activity. °SIGNS AND SYMPTOMS  °· Wheezing. °· Excessive coughing, particularly at night. °· Chest tightness. °· Shortness of breath. °DIAGNOSIS  °Your health care provider will ask you about your medical history and perform a physical exam. A chest X-ray or blood testing may be performed to look for other causes of your symptoms or other conditions that may have triggered your asthma attack.  °TREATMENT  °Treatment is aimed at reducing inflammation and opening up the airways in your lungs.  Most asthma attacks are treated with inhaled medicines. These include quick relief or rescue medicines (such as bronchodilators) and controller medicines (such as inhaled corticosteroids). These medicines are sometimes  given through an inhaler or a nebulizer. Systemic steroid medicine taken by mouth or given through an IV tube also can be used to reduce the inflammation when an attack is moderate or severe. Antibiotic medicines are only used if a bacterial infection is present.  °HOME CARE INSTRUCTIONS  °· Rest. °· Drink plenty of liquids. This helps the mucus to remain thin and be easily coughed up. Only use caffeine in moderation and do not use alcohol until you have recovered from your illness. °· Do not smoke. Avoid being exposed to secondhand smoke. °· You play a critical role in keeping yourself in good health. Avoid exposure to things that cause you to wheeze or to have breathing problems. °· Keep your medicines up-to-date and available. Carefully follow your health care provider's treatment plan. °· Take your medicine exactly as prescribed. °· When pollen or pollution is bad, keep windows closed and use an air conditioner or go to places with air conditioning. °· Asthma requires careful medical care. See your health care provider for a follow-up as advised. If you are more than [redacted] weeks pregnant and you were prescribed any new medicines, let your obstetrician know about the visit and how you are doing. Follow up with your health care provider as directed. °· After you have recovered from your asthma attack, make an appointment with your outpatient doctor to talk about ways to reduce the likelihood of future attacks. If you do not have a doctor who manages your asthma, make an appointment with a primary care doctor to discuss your asthma. °SEEK IMMEDIATE MEDICAL CARE IF:  °· You are getting worse. °· You have trouble breathing. If severe, call your local   emergency services (911 in the [place]  You develop chest pain or discomfort.  You are vomiting.  You are not able to keep fluids down.  You are coughing up yellow, green, brown, or bloody sputum.  You have a fever and your symptoms suddenly get worse.  You have  trouble swallowing. MAKE SURE YOU:   Understand these instructions.  Will watch your condition.  Will get help right away if you are not doing well or get worse. Document Released: 10/17/2006 Document Revised: 07/07/2013 Document Reviewed: 01/07/2013 [facility] Patient Information 2015 [place], [place]. This information is not intended to replace advice given to you by your health care provider. Make sure you discuss any questions you have with your health care provider.  Antibiotic Medication Antibiotics are among the most frequently prescribed medicines. Antibiotics cure illness by assisting our body to injure or kill the bacteria that cause infection. While antibiotics are useful to treat a wide variety of infections they are useless against viruses. Antibiotics cannot cure colds, flu, or other viral infections.  There are many types of antibiotics available. Your caregiver will decide which antibiotic will be useful for an illness. Never take or give someone else's antibiotics or left over medicine. Your caregiver may also take into account:  Allergies.  The cost of the medicine.  Dosing schedules.  Taste.  Common side effects when choosing an antibiotic for an infection. Ask your caregiver if you have questions about why a certain medicine was chosen. HOME CARE INSTRUCTIONS Read all instructions and labels on medicine bottles carefully. Some antibiotics should be taken on an empty stomach while others should be taken with food. Taking antibiotics incorrectly may reduce how well they work. Some antibiotics need to be kept in the refrigerator. Others should be kept at room temperature. Ask your caregiver or pharmacist if you do not understand how to give the medicine. Be sure to give the amount of medicine your caregiver has prescribed. Even if you feel better and your symptoms improve, bacteria may still remain alive in the body. Taking all of the medicine will prevent:  The infection  from returning and becoming harder to treat.  Complications from partially treated infections. If there is any medicine left over after you have taken the medicine as your caregiver has instructed, throw the medicine away. Be sure to tell your caregiver if you:  Are allergic to any medicines.  Are pregnant or intend to become pregnant while using this medicine.  Are breastfeeding.  Are taking any other prescription, non-prescription medicine, or herbal remedies.  Have any other medical conditions or problems you have not already discussed. If you are taking birth control pills, they may not work while you are on antibiotics. To avoid unwanted pregnancy:  Continue taking your birth control pills as usual.  Use a second form of birth control (such as condoms) while you are taking antibiotic medicine.  When you finish taking the antibiotic medicine, continue using the second form of birth control until you are finished with your current 1 month cycle of birth control pills. Try not to miss any doses of medicine. If you miss a dose, take it as soon as possible. However, if it is almost time for the next dose and the dosing schedule is:  2 doses a day, take the missed dose and the next dose 5 to 6 hours apart.  3 or more doses a day, take the missed dose and the next dose 2 to 4 hours apart, then go  back to the normal schedule.  If you are unable to make up a missed dose, take the next scheduled dose on time and complete the missed dose at the end of the prescribed time for your medicine. SIDE EFFECTS TO TAKING ANTIBIOTICS Common side effects to antibiotic use include:  Soft stools or diarrhea.  Mild stomach upset.  Sun sensitivity. SEEK MEDICAL CARE IF:   If you get worse or do not improve within a few days of starting the medicine.  Vomiting develops.  Diaper rash or rash on the genitals appears.  Vaginal itching occurs.  White patches appear on the tongue or in the  mouth.  Severe watery diarrhea and abdominal cramps occur.  Signs of an allergy develop (hives, unknown itchy rash appears). STOP TAKING THE ANTIBIOTIC. SEEK IMMEDIATE MEDICAL CARE IF:   Urine turns dark or blood colored.  Skin turns yellow.  Easy bruising or bleeding occurs.  Joint pain or muscle aches occur.  Fever returns.  Severe headache occurs.  Signs of an allergy develop (trouble breathing, wheezing, swelling of the lips, face or tongue, fainting, or blisters on the skin or in the mouth). STOP TAKING THE ANTIBIOTIC. Document Released: 03/14/2004 Document Revised: 09/24/2011 Document Reviewed: 03/24/2009 [facility] Patient Information 2015 [place], [place]. This information is not intended to replace advice given to you by your health care provider. Make sure you discuss any questions you have with your health care provider.  Acute Respiratory Failure Respiratory failure is when your lungs are not working well and your breathing (respiratory) system fails. When respiratory failure occurs, it is difficult for your lungs to get enough oxygen, get rid of carbon dioxide, or both. Respiratory failure can be life threatening.  Respiratory failure can be acute or chronic. Acute respiratory failure is sudden, severe, and requires emergency medical treatment. Chronic respiratory failure is less severe, happens over time, and requires ongoing treatment.  WHAT ARE THE CAUSES OF ACUTE RESPIRATORY FAILURE?  Any problem affecting the heart or lungs can cause acute respiratory failure. Some of these causes include the following:  Chronic bronchitis and emphysema (COPD).   Blood clot going to a lung (pulmonary embolism).   Having water in the lungs caused by heart failure, lung injury, or infection (pulmonary edema).   Collapsed lung (pneumothorax).   Pneumonia.   Pulmonary fibrosis.   Obesity.   Asthma.   Heart failure.   Any type of trauma to the chest that can make  breathing difficult.   Nerve or muscle diseases making chest movements difficult. WHAT SYMPTOMS SHOULD YOU WATCH FOR?  If you have any of these signs or symptoms, you should seek immediate medical care:   You have shortness of breath (dyspnea) with or without activity.   You have rapid, fast breathing (tachypnea).   You are wheezing.  You are unable to say more than a few words without having to catch your breath.  You find it very difficult to function normally.  You have a fast heart rate.   You have a bluish color to your finger or toe nail beds.   You have confusion or drowsiness or both.  HOW WILL MY ACUTE RESPIRATORY FAILURE BE TREATED?  Treatment of acute respiratory failure depends on the cause of the respiratory failure. Usually, you will stay in the intensive care unit so your breathing can be watched closely. Treatment can include the following:  Oxygen. Oxygen can be delivered through the following:  Nasal cannula. This is small tubing that goes in  your nose to give you oxygen.  Face mask. A face mask covers your nose and mouth to give you oxygen.  Medicine. Different medicines can be given to help with breathing. These can include:  Nebulizers. Nebulizers deliver medicines to open the air passages (bronchodilators). These medicines help to open or relax the airways in the lungs so you can breathe better. They can also help loosen mucus from your lungs.  Diuretics. Diuretic medicines can help you breathe better by getting rid of extra water in your body.  Steroids. Steroid medicines can help decrease swelling (inflammation) in your lungs.  Antibiotics.  Chest tube. If you have a collapsed lung (pneumothorax), a chest tube is placed to help reinflate the lung.  Non-invasive positive pressure ventilation (NPPV). This is a tight-fitting mask that goes over your nose and mouth. The mask has tubing that is attached to a machine. The machine blows air into the  tubing, which helps to keep the tiny air sacs (alveoli) in your lungs open. This machine allows you to breathe on your own.  Ventilator. A ventilator is a breathing machine. When on a ventilator, a breathing tube is put into the lungs. A ventilator is used when you can no longer breathe well enough on your own. You may have low oxygen levels or high carbon dioxide (CO2) levels in your blood. When you are on a ventilator, sedation and pain medicines are given to make you sleep so your lungs can heal. Document Released: 07/07/2013 Document Revised: 11/16/2013 Document Reviewed: 07/07/2013 [facility] Patient Information 2015 [place], [place]. This information is not intended to replace advice given to you by your health care provider. Make sure you discuss any questions you have with your health care provider.

## 2014-11-27 NOTE — Progress Notes (Signed)
[place] [facility] [place] NAME: Carly [name]    MR#:  [id number]  DATE OF BIRTH:  02-Apr-1971  SUBJECTIVE:  CHIEF COMPLAINT:   Chief Complaint  Patient presents with  . Shortness of Breath   Feels better today, less wheezing, still did not sleep well today REVIEW OF SYSTEMS:   CONSTITUTIONAL: No fever, fatigue or weakness.  EYES: No blurred or double vision.  EARS, NOSE, AND THROAT: No tinnitus or ear pain.  RESPIRATORY: some dry cough, less shortness of breath, wheezing or hemoptysis.  CARDIOVASCULAR: No chest pain, orthopnea, edema.  GASTROINTESTINAL: No nausea, vomiting, diarrhea or abdominal pain.  GENITOURINARY: No dysuria, hematuria.  ENDOCRINE: No polyuria, nocturia,  HEMATOLOGY: No anemia, easy bruising or bleeding SKIN: No rash or lesion. MUSCULOSKELETAL: No joint pain or arthritis.   NEUROLOGIC: No tingling, numbness, weakness.  PSYCHIATRY: No anxiety or depression.   VITAL SIGNS: Blood pressure 121/73, pulse 108, temperature 97.7 F (36.5 C), temperature source Oral, resp. rate 18, height 5\' 4"  (1.626 m), weight 82.555 kg (182 lb), last menstrual period 11/12/2014, SpO2 95 %.  PHYSICAL EXAMINATION:   GENERAL:  44 y.o.-year-old patient lying in the bed with no acute distress.  EYES: Pupils equal, round, reactive to light and accommodation. No scleral icterus. Extraocular muscles intact.  HEENT: Head atraumatic, normocephalic. Oropharynx and nasopharynx clear.  NECK:  Supple, no jugular venous distention. No thyroid enlargement, no tenderness.  LUNGS: Diminished breath sounds bilaterally, some intermittent wheezing, rales, more rhonchi than wheezing, no crepitation. No use of accessory muscles of respiration.  CARDIOVASCULAR: S1, S2 normal. No murmurs, rubs, or gallops.  ABDOMEN: Soft, nontender, nondistended. Bowel sounds present. No organomegaly or mass.  EXTREMITIES: No pedal edema, cyanosis, or clubbing.  NEUROLOGIC:  Cranial nerves II through XII are intact. Muscle strength 5/5 in all extremities. Sensation intact. Gait not checked.  PSYCHIATRIC: The patient is alert and oriented x 3.  SKIN: No obvious rash, lesion, or ulcer.   ORDERS/RESULTS REVIEWED:   CBC  Recent Labs Lab 11/26/14 1453 11/26/14 1902 11/27/14 0539  WBC 21.1* 18.7* 15.7*  HGB 15.3 14.0 13.7  HCT 45.9 42.0 40.9  PLT 148* 113* 119*  MCV 96.5 97.6 97.8  MCH 32.2 32.4 32.8  MCHC 33.4 33.2 33.6  RDW 13.5 13.4 13.6   ------------------------------------------------------------------------------------------------------------------  Chemistries   Recent Labs Lab 11/26/14 1453 11/26/14 1902 11/27/14 0539  NA 137  --  138  K 3.7  --  4.1  CL 102  --  108  CO2 21*  --  22  GLUCOSE 99  --  164*  BUN 8  --  8  CREATININE 0.77 0.86 0.60  CALCIUM 9.0  --  8.5*  MG 1.5*  --   --    ------------------------------------------------------------------------------------------------------------------ estimated creatinine clearance is 94.3 mL/min (by C-G formula based on Cr of 0.6). ------------------------------------------------------------------------------------------------------------------ No results for input(s): TSH, T4TOTAL, T3FREE, THYROIDAB in the last 72 hours.  Invalid input(s): FREET3  Cardiac Enzymes  Recent Labs Lab 11/26/14 1453  TROPONINI <0.03   ------------------------------------------------------------------------------------------------------------------ Invalid input(s): POCBNP ---------------------------------------------------------------------------------------------------------------  RADIOLOGY: Dg Chest 2 View  11/26/2014   CLINICAL DATA:  Diagnosed with bronchitis 10 days ago has been taking an antibiotic, Pt is not feeling better. C/o shortness of breath, non-productive cough. Smoker for 27 years ago.  EXAM: CHEST  2 VIEW  COMPARISON:  None.  FINDINGS: Normal heart, mediastinum and hila.  Clear  lungs.  No pleural effusion or pneumothorax.  Bony  thorax is intact.  IMPRESSION: No active cardiopulmonary disease.   Electronically Signed   By: Lajean Manes M.D.   On: 11/26/2014 15:13    EKG:  Orders placed or performed during the hospital encounter of 11/26/14  . ED EKG  . ED EKG  . EKG 12-Lead  . EKG 12-Lead    ASSESSMENT AND PLAN:  1. Acute respiratory distress due to likely underlying COPD exacerbation with acute bronchitis; conmtyinue nebulizers with duonebs, taper steroids, continue antibiotics. Continue Dulera. She will need outpatient pulmonary follow-up and PFTs. Prescribe nebulizing machine for home.   2. Hypertension continueToprol-XL and amlodipine as taking at home.  3. Zaidi disorder continue clonazepam and Zoloft as taking at home.  4. Nicotine addiction,  smoking cessation done,  strongly recommended patient stop smoking, again  4 more  minutes spent patient , continue nicotine patch   Management plans discussed with the patient, family and they are in agreement.   DRUG ALLERGIES: No Known Allergies  CODE STATUS:     Code Status Orders        Start     Ordered   11/26/14 1825  Full code   Continuous     11/26/14 1824      TOTAL TIME TAKING CARE OF THIS PATIENT: 40  minutes.    Theodoro Grist M.D on 11/27/2014 at 8:33 AM  Between 7am to 6pm - Pager - [phone]  After 6pm go to [email or web address] - password [email or web address] [facility]  Office  [phone]  CC: Primary care physician; Rubbie Battiest, NP

## 2014-11-27 NOTE — Progress Notes (Signed)
Pt being discharged home at this time, husband at bedside, pt up ambulating in room and hallway without difficulty, noted some dyspnea on exertion o2 sats remained 98% on room air with exertion, pt states feeling much better now, nebulizer machine given to pt-educated and demonstrates proper use of machine, pt with no noted complaints at discharge, discharge instructions and prescriptions reviewed with pt, states understanding

## 2014-11-27 NOTE — Discharge Summary (Signed)
[place] [facility] [place] NAME: Carly [name]    MR#:  [id number]  DATE OF BIRTH:  21-May-1971  DATE OF ADMISSION:  11/26/2014 ADMITTING PHYSICIAN: Dustin Flock, MD  DATE OF DISCHARGE: No discharge date for patient encounter.  PRIMARY CARE PHYSICIAN: Rubbie Battiest, NP     ADMISSION DIAGNOSIS:  Acute exacerbation of chronic obstructive pulmonary disease (COPD) [J44.1] SIRS (systemic inflammatory response syndrome) [A41.9]  DISCHARGE DIAGNOSIS:  Active Problems:   Acute bronchitis with COPD   SECONDARY DIAGNOSIS:   Past Medical History  Diagnosis Date  . Chicken pox   . Emphysema of lung   . Frequent headaches   . Hay fever   . Hypertension   . Anxiety   . Elevated testosterone level in female     .pro HOSPITAL COURSE:  came in with SOB, wheezing, cough, started on Dulera, steroids, nebulizers, Levaquin, improved,. Was ready to be discharged today, May 14 th, 2014 Theodoro Grist, MD Physician Signed Internal Medicine Progress Notes 11/27/2014 8:33 AM    Expand All Collapse All   [place] [facility] [place] NAME: Carly [name]   MR#: [id number]  DATE OF BIRTH: [birth year]  SUBJECTIVE:  CHIEF COMPLAINT:  Chief Complaint  Patient presents with  . Shortness of Breath   Feels better today, less wheezing, still did not sleep well today REVIEW OF SYSTEMS:   CONSTITUTIONAL: No fever, fatigue or weakness.  EYES: No blurred or double vision.  EARS, NOSE, AND THROAT: No tinnitus or ear pain.  RESPIRATORY:  some dry cough, less shortness of breath, wheezing or hemoptysis CARDIOVASCULAR: No chest pain, orthopnea, edema.  GASTROINTESTINAL: No nausea, vomiting, diarrhea or abdominal pain.  GENITOURINARY: No dysuria, hematuria.  ENDOCRINE: No polyuria, nocturia,  HEMATOLOGY: No anemia, easy bruising or bleeding SKIN: No rash or lesion. MUSCULOSKELETAL: No joint  pain or arthritis.  NEUROLOGIC: No tingling, numbness, weakness.  PSYCHIATRY: No anxiety or depression.   VITAL SIGNS: Blood pressure 121/73, pulse 108, temperature 97.7 F (36.5 C), temperature source Oral, resp. rate 18, height 5\' 4"  (1.626 m), weight 82.555 kg (182 lb), last menstrual period 11/12/2014, SpO2 95 %.  PHYSICAL EXAMINATION:   GENERAL: 44 y.o.-year-old patient lying in the bed with no acute distress.  EYES: Pupils equal, round, reactive to light and accommodation. No scleral icterus. Extraocular muscles intact.  HEENT: Head atraumatic, normocephalic. Oropharynx and nasopharynx clear.  NECK: Supple, no jugular venous distention. No thyroid enlargement, no tenderness.  LUNGS: Diminished breath sounds bilaterally, some intermittent wheezing, rales, more rhonchi than wheezing, no crepitation. No use of accessory muscles of respiration.  CARDIOVASCULAR: S1, S2 normal. No murmurs, rubs, or gallops.  ABDOMEN: Soft, nontender, nondistended. Bowel sounds present. No organomegaly or mass.  EXTREMITIES: No pedal edema, cyanosis, or clubbing.  NEUROLOGIC: Cranial nerves II through XII are intact. Muscle strength 5/5 in all extremities. Sensation intact. Gait not checked.  PSYCHIATRIC: The patient is alert and oriented x 3.  SKIN: No obvious rash, lesion, or ulcer.   ORDERS/RESULTS REVIEWED:   CBC  Last Labs      Recent Labs Lab 11/26/14 1453 11/26/14 1902 11/27/14 0539  WBC 21.1* 18.7* 15.7*  HGB 15.3 14.0 13.7  HCT 45.9 42.0 40.9  PLT 148* 113* 119*  MCV 96.5 97.6 97.8  MCH 32.2 32.4 32.8  MCHC 33.4 33.2 33.6  RDW 13.5 13.4 13.6     ------------------------------------------------------------------------------------------------------------------  Chemistries   Last Labs  Recent Labs Lab 11/26/14 1453 11/26/14 1902 11/27/14 0539  NA 137 --  138  K 3.7 --  4.1  CL 102 --  108  CO2 21*  --  22  GLUCOSE 99 --  164*  BUN 8 --  8  CREATININE 0.77 0.86 0.60  CALCIUM 9.0 --  8.5*  MG 1.5* --  --      ------------------------------------------------------------------------------------------------------------------ estimated creatinine clearance is 94.3 mL/min (by C-G formula based on Cr of 0.6). ------------------------------------------------------------------------------------------------------------------  Recent Labs (last 2 labs)     No results for input(s): TSH, T4TOTAL, T3FREE, THYROIDAB in the last 72 hours.  Invalid input(s): FREET3    Cardiac Enzymes  Last Labs      Recent Labs Lab 11/26/14 1453  TROPONINI <0.03     ------------------------------------------------------------------------------------------------------------------  Last Labs     Invalid input(s): POCBNP   ---------------------------------------------------------------------------------------------------------------   Imaging Results (Last 48 hours)    RADIOLOGY: Dg Chest 2 View  11/26/2014 CLINICAL DATA: Diagnosed with bronchitis 10 days ago has been taking an antibiotic, Pt is not feeling better. C/o shortness of breath, non-productive cough. Smoker for 27 years ago. EXAM: CHEST 2 VIEW COMPARISON: None. FINDINGS: Normal heart, mediastinum and hila. Clear lungs. No pleural effusion or pneumothorax. Bony thorax is intact. IMPRESSION: No active cardiopulmonary disease. Electronically Signed By: Lajean Manes M.D. On: 11/26/2014 15:13     EKG:  Orders placed or performed during the hospital encounter of 11/26/14  . ED EKG  . ED EKG  . EKG 12-Lead  . EKG 12-Lead    ASSESSMENT AND PLAN:  1. Acute respiratory distress due to likely underlying COPD exacerbation with acute bronchitis; conmtyinue nebulizers with duonebs, taper steroids, continue antibiotics. Continue Dulera. She will need outpatient pulmonary follow-up and PFTs.  Prescribe nebulizing machine for home and duonebs.   2. Hypertension continueToprol-XL and amlodipine as taking at home.  3. Zaidi disorder continue clonazepam and Zoloft as taking at home.  4. Nicotine addiction, smoking cessation done, strongly recommended patient stop smoking, again 4 more minutes spent patient , continue nicotine patch   Management plans discussed with the patient, family and they are in agreement.   DRUG ALLERGIES: No Known Allergies  CODE STATUS:     Code Status Orders        Start   Ordered   11/26/14 1825  Full code Continuous    11/26/14 1824      TOTAL TIME TAKING CARE OF THIS PATIENT: 40 minutes.    Theodoro Grist M.D on 11/27/2014 at 8:33 AM  Between 7am to 6pm - Pager - [phone]  After 6pm go to [email or web address] - password [email or web address] [facility]  [facility]  Office [phone]  CC: Primary care physician; Rubbie Battiest, NP             DISCHARGE CONDITIONS:   good  CONSULTS OBTAINED:  Treatment Team:  Dustin Flock, MD Theodoro Grist, MD  DRUG ALLERGIES:  No Known Allergies  DISCHARGE MEDICATIONS:   Current Discharge Medication List    START taking these medications   Details  guaiFENesin (MUCINEX) 600 MG 12 hr tablet Take 1 tablet (600 mg total) by mouth 2 (two) times daily. Qty: 30 tablet, Refills: 0    ipratropium-albuterol (DUONEB) 0.5-2.5 (3) MG/3ML SOLN Take 3 mLs by nebulization every 4 (four) hours as needed. Qty: 360 mL, Refills: 2    levofloxacin (LEVAQUIN) 500 MG tablet Take 1 tablet (500 mg total) by mouth daily. Qty: 7 tablet, Refills: 0  methylPREDNISolone (MEDROL DOSEPAK) 4 MG TBPK tablet follow package directions Qty: 21 tablet, Refills: 0    mometasone-formoterol (DULERA) 100-5 MCG/ACT AERO Inhale 2 puffs into the lungs 2 (two) times daily. Qty: 1 Inhaler, Refills: 2    nicotine (NICODERM CQ - DOSED IN MG/24 HOURS) 21 mg/24hr patch Place 1 patch (21 mg  total) onto the skin daily. Qty: 28 patch, Refills: 0      CONTINUE these medications which have NOT CHANGED   Details  amLODipine (NORVASC) 5 MG tablet take 1 tablet by mouth once daily Qty: 30 tablet, Refills: 5    benzonatate (TESSALON PERLES) 100 MG capsule Take 1-2 capsules (100-200 mg total) by mouth every 8 (eight) hours as needed for cough. Qty: 30 capsule, Refills: 0   Associated Diagnoses: Cough    clonazePAM (KLONOPIN) 0.5 MG tablet Take 1 tablet (0.5 mg total) by mouth at bedtime. Qty: 30 tablet, Refills: 2    ibuprofen (ADVIL,MOTRIN) 200 MG tablet Take 400-600 mg by mouth every 6 (six) hours as needed for headache.    metoprolol succinate (TOPROL-XL) 50 MG 24 hr tablet Take 1 tablet (50 mg total) by mouth daily. Take with or immediately following a meal. Qty: 30 tablet, Refills: 11    sertraline (ZOLOFT) 25 MG tablet take 1 tablet by mouth once daily Qty: 30 tablet, Refills: 2    spironolactone (ALDACTONE) 25 MG tablet Take 1 tablet (25 mg total) by mouth daily. Qty: 30 tablet, Refills: 3   Associated Diagnoses: Female hirsutism; Elevated testosterone level in female    vitamin B-12 (CYANOCOBALAMIN) 1000 MCG tablet Take 1,000 mcg by mouth daily.         DISCHARGE INSTRUCTIONS:    Follow up with PCP in 2-3 days  If you experience worsening of your admission symptoms, develop shortness of breath, life threatening emergency, suicidal or homicidal thoughts you must seek medical attention immediately by calling 911 or calling your MD immediately  if symptoms less severe.  You Must read complete instructions/literature along with all the possible adverse reactions/side effects for all the Medicines you take and that have been prescribed to you. Take any new Medicines after you have completely understood and accept all the possible adverse reactions/side effects.   Please note  You were cared for by a hospitalist during your hospital stay. If you have any questions  about your discharge medications or the care you received while you were in the hospital after you are discharged, you can call the unit and asked to speak with the hospitalist on call if the hospitalist that took care of you is not available. Once you are discharged, your primary care physician will handle any further medical issues. Please note that NO REFILLS for any discharge medications will be authorized once you are discharged, as it is imperative that you return to your primary care physician (or establish a relationship with a primary care physician if you do not have one) for your aftercare needs so that they can reassess your need for medications and monitor your lab values.    Today   CHIEF COMPLAINT:   Chief Complaint  Patient presents with  . Shortness of Breath    HISTORY OF PRESENT ILLNESS:  Carly [name]  is a 44 y.o. female with a known history of emphysema  Comes in coughing, dry cough, wheezing, sob, noted to have high WBC count. Started on Levaquin, steroids, nebulizer, improved, wanted to go home today.   1. Acute respiratory distress due to  likely underlying COPD exacerbation with acute bronchitis; conmtyinue nebulizers with duonebs, taper steroids, continue antibiotics. Continue Dulera. She will need outpatient pulmonary follow-up and PFTs. Prescribe nebulizing machine for home and duonebs.   2. Hypertension continueToprol-XL and amlodipine as taking at home.  3. Zaidi disorder continue clonazepam and Zoloft as taking at home.  4. Nicotine addiction, smoking cessation done, strongly recommended patient stop smoking, again 4 more minutes spent patient , continue nicotine patch    VITAL SIGNS:  Blood pressure 121/73, pulse 108, temperature 97.7 F (36.5 C), temperature source Oral, resp. rate 18, height 5\' 4"  (1.626 m), weight 82.555 kg (182 lb), last menstrual period 11/12/2014, SpO2 95 %.  I/O:   Intake/Output Summary (Last 24 hours) at 11/27/14 0903 Last  data filed at 11/27/14 0526  Gross per 24 hour  Intake      0 ml  Output    500 ml  Net   -500 ml    PHYSICAL EXAMINATION:  GENERAL:  44 y.o.-year-old patient lying in the bed with no acute distress.  EYES: Pupils equal, round, reactive to light and accommodation. No scleral icterus. Extraocular muscles intact.  HEENT: Head atraumatic, normocephalic. Oropharynx and nasopharynx clear.  NECK:  Supple, no jugular venous distention. No thyroid enlargement, no tenderness.  LUNGS: Normal breath sounds bilaterally, no wheezing, rales,rhonchi or crepitation. No use of accessory muscles of respiration.  CARDIOVASCULAR: S1, S2 normal. No murmurs, rubs, or gallops.  ABDOMEN: Soft, non-tender, non-distended. Bowel sounds present. No organomegaly or mass.  EXTREMITIES: No pedal edema, cyanosis, or clubbing.  NEUROLOGIC: Cranial nerves II through XII are intact. Muscle strength 5/5 in all extremities. Sensation intact. Gait not checked.  PSYCHIATRIC: The patient is alert and oriented x 3.  SKIN: No obvious rash, lesion, or ulcer.   DATA REVIEW:   CBC  Recent Labs Lab 11/27/14 0539  WBC 15.7*  HGB 13.7  HCT 40.9  PLT 119*    Chemistries   Recent Labs Lab 11/26/14 1453  11/27/14 0539  NA 137  --  138  K 3.7  --  4.1  CL 102  --  108  CO2 21*  --  22  GLUCOSE 99  --  164*  BUN 8  --  8  CREATININE 0.77  < > 0.60  CALCIUM 9.0  --  8.5*  MG 1.5*  --   --   < > = values in this interval not displayed.  Cardiac Enzymes  Recent Labs Lab 11/26/14 1453  TROPONINI <0.03    Microbiology Results  Results for orders placed or performed during the hospital encounter of 11/26/14  Culture, blood (routine x 2)     Status: None (Preliminary result)   Collection Time: 11/26/14  4:58 PM  Result Value Ref Range Status   Specimen Description BLOOD  Final   Special Requests Normal  Final   Culture NO GROWTH < 24 HOURS  Final   Report Status PENDING  Incomplete  Culture, blood (routine x  2)     Status: None (Preliminary result)   Collection Time: 11/26/14  4:58 PM  Result Value Ref Range Status   Specimen Description BLOOD  Final   Special Requests Normal  Final   Culture NO GROWTH < 24 HOURS  Final   Report Status PENDING  Incomplete    RADIOLOGY:  Dg Chest 2 View  11/26/2014   CLINICAL DATA:  Diagnosed with bronchitis 10 days ago has been taking an antibiotic, Pt is not feeling better.  C/o shortness of breath, non-productive cough. Smoker for 27 years ago.  EXAM: CHEST  2 VIEW  COMPARISON:  None.  FINDINGS: Normal heart, mediastinum and hila.  Clear lungs.  No pleural effusion or pneumothorax.  Bony thorax is intact.  IMPRESSION: No active cardiopulmonary disease.   Electronically Signed   By: Lajean Manes M.D.   On: 11/26/2014 15:13    EKG:   Orders placed or performed during the hospital encounter of 11/26/14  . ED EKG  . ED EKG  . EKG 12-Lead  . EKG 12-Lead      Management plans discussed with the patient, family and they are in agreement.  CODE STATUS:     Code Status Orders        Start     Ordered   11/26/14 1825  Full code   Continuous     11/26/14 1824      TOTAL TIME TAKING CARE OF THIS PATIENT: 40  minutes.    Theodoro Grist M.D on 11/27/2014 at 9:03 AM  Between 7am to 6pm - Pager - [phone]  After 6pm go to [email or web address] - password [email or web address] [facility]  Office  [phone]  CC: Primary care physician; Rubbie Battiest, NP

## 2014-11-27 NOTE — Plan of Care (Signed)
Problem: Discharge Progression Outcomes Goal: Other Discharge Outcomes/Goals Outcome: Progressing Plan of care progress to goal for: Pain-no c/o pain this shift Hemodynamically-continues on IV fluids and solumedrol Complications-pt c/o coughing, prn meds given with relief Diet-pt tolerating diet Activity-pt up to bathroom unassisted several times per pt  Problem: Discharge Progression Outcomes Goal: Other Discharge Outcomes/Goals Outcome: Progressing Plan of care progress to goal for: Dyspnea-pt SOB on exertion  O2-pt remains 96% on RA Pain-no c/o pain this shift Hemodynamically-continues on IV fluids and solumedrol Complications-pt c/o coughing, prn meds given with relief Diet-pt tolerating diet Activity-pt up to bathroom unassisted several times per pt

## 2014-11-29 ENCOUNTER — Telehealth: Payer: Self-pay | Admitting: Nurse Practitioner

## 2014-11-29 NOTE — Telephone Encounter (Signed)
HFU- COPD, asthma, bronchitis. Discharged on  11/27/14. Pt scheduled for appt on 11/30/14.

## 2014-11-29 NOTE — Telephone Encounter (Signed)
No TCM call needed if keeps 11/30/14 appt

## 2014-11-30 ENCOUNTER — Encounter: Payer: Self-pay | Admitting: Nurse Practitioner

## 2014-11-30 ENCOUNTER — Ambulatory Visit (INDEPENDENT_AMBULATORY_CARE_PROVIDER_SITE_OTHER): Payer: BLUE CROSS/BLUE SHIELD | Admitting: Nurse Practitioner

## 2014-11-30 VITALS — BP 126/84 | HR 100 | Temp 98.3°F | Resp 14 | Ht 64.5 in | Wt 181.0 lb

## 2014-11-30 DIAGNOSIS — J209 Acute bronchitis, unspecified: Secondary | ICD-10-CM

## 2014-11-30 DIAGNOSIS — J441 Chronic obstructive pulmonary disease with (acute) exacerbation: Secondary | ICD-10-CM | POA: Diagnosis not present

## 2014-11-30 DIAGNOSIS — J440 Chronic obstructive pulmonary disease with acute lower respiratory infection: Principal | ICD-10-CM

## 2014-11-30 NOTE — Telephone Encounter (Signed)
Pt seen in office 11/30/14

## 2014-11-30 NOTE — Progress Notes (Signed)
   Subjective:    Patient ID: Carly [name], female    DOB: 11-13-1970, 44 y.o.   MRN: [id number]  HPI  [name] is a 44 yo female with a HFU. D/c on 5/14.   1) Much improved, compliant on medications, working well with patch and cut down on smoking. Needs pulmonary referral.   Review of Systems  Constitutional: Negative for fever, chills, diaphoresis and fatigue.  Respiratory: Negative for chest tightness, shortness of breath and wheezing.   Cardiovascular: Negative for chest pain, palpitations and leg swelling.  Gastrointestinal: Negative for nausea, vomiting and diarrhea.  Skin: Negative for rash.  Neurological: Negative for dizziness, weakness, numbness and headaches.  Psychiatric/Behavioral: The patient is not nervous/anxious.       Objective:   Physical Exam  Constitutional: She is oriented to person, place, and time. She appears well-developed and well-nourished. No distress.  BP 126/84 mmHg  Pulse 100  Temp(Src) 98.3 F (36.8 C) (Oral)  Resp 14  Ht 5' 4.5" (1.638 m)  Wt 181 lb (82.101 kg)  BMI 30.60 kg/m2  SpO2 96%  LMP 11/12/2014 (Approximate)   HENT:  Head: Normocephalic and atraumatic.  Right Ear: External ear normal.  Left Ear: External ear normal.  Cardiovascular: Normal rate, regular rhythm and normal heart sounds.   Pulmonary/Chest: Effort normal and breath sounds normal. No respiratory distress. She has no wheezes. She has no rales. She exhibits no tenderness.  Neurological: She is alert and oriented to person, place, and time. No cranial nerve deficit. She exhibits normal muscle tone. Coordination normal.  Skin: Skin is warm and dry. No rash noted. She is not diaphoretic.  Psychiatric: She has a normal mood and affect. Her behavior is normal. Judgment and thought content normal.      Assessment & Plan:

## 2014-11-30 NOTE — Progress Notes (Signed)
Pre visit review using our clinic review tool, if applicable. No additional management support is needed unless otherwise documented below in the visit note. 

## 2014-11-30 NOTE — Patient Instructions (Signed)
We will contact you in the next few weeks with your referral to pulmonology (Dr. Lake Bells).   Continue with treatment as outlined.   Chronic Obstructive Pulmonary Disease Chronic obstructive pulmonary disease (COPD) is a common lung condition in which airflow from the lungs is limited. COPD is a general term that can be used to describe many different lung problems that limit airflow, including both chronic bronchitis and emphysema. If you have COPD, your lung function will probably never return to normal, but there are measures you can take to improve lung function and make yourself feel better.  CAUSES   Smoking (common).   Exposure to secondhand smoke.   Genetic problems.  Chronic inflammatory lung diseases or recurrent infections. SYMPTOMS   Shortness of breath, especially with physical activity.   Deep, persistent (chronic) cough with a large amount of thick mucus.   Wheezing.   Rapid breaths (tachypnea).   Gray or bluish discoloration (cyanosis) of the skin, especially in fingers, toes, or lips.   Fatigue.   Weight loss.   Frequent infections or episodes when breathing symptoms become much worse (exacerbations).   Chest tightness. DIAGNOSIS  Your health care provider will take a medical history and perform a physical examination to make the initial diagnosis. Additional tests for COPD may include:   Lung (pulmonary) function tests.  Chest X-ray.  CT scan.  Blood tests. TREATMENT  Treatment available to help you feel better when you have COPD includes:   Inhaler and nebulizer medicines. These help manage the symptoms of COPD and make your breathing more comfortable.  Supplemental oxygen. Supplemental oxygen is only helpful if you have a low oxygen level in your blood.   Exercise and physical activity. These are beneficial for nearly all people with COPD. Some people may also benefit from a pulmonary rehabilitation program. HOME CARE INSTRUCTIONS    Take all medicines (inhaled or pills) as directed by your health care provider.  Avoid over-the-counter medicines or cough syrups that dry up your airway (such as antihistamines) and slow down the elimination of secretions unless instructed otherwise by your health care provider.   If you are a smoker, the most important thing that you can do is stop smoking. Continuing to smoke will cause further lung damage and breathing trouble. Ask your health care provider for help with quitting smoking. He or she can direct you to community resources or hospitals that provide support.  Avoid exposure to irritants such as smoke, chemicals, and fumes that aggravate your breathing.  Use oxygen therapy and pulmonary rehabilitation if directed by your health care provider. If you require home oxygen therapy, ask your health care provider whether you should purchase a pulse oximeter to measure your oxygen level at home.   Avoid contact with individuals who have a contagious illness.  Avoid extreme temperature and humidity changes.  Eat healthy foods. Eating smaller, more frequent meals and resting before meals may help you maintain your strength.  Stay active, but balance activity with periods of rest. Exercise and physical activity will help you maintain your ability to do things you want to do.  Preventing infection and hospitalization is very important when you have COPD. Make sure to receive all the vaccines your health care provider recommends, especially the pneumococcal and influenza vaccines. Ask your health care provider whether you need a pneumonia vaccine.  Learn and use relaxation techniques to manage stress.  Learn and use controlled breathing techniques as directed by your health care provider. Controlled breathing techniques  include:   Pursed lip breathing. Start by breathing in (inhaling) through your nose for 1 second. Then, purse your lips as if you were going to whistle and breathe  out (exhale) through the pursed lips for 2 seconds.   Diaphragmatic breathing. Start by putting one hand on your abdomen just above your waist. Inhale slowly through your nose. The hand on your abdomen should move out. Then purse your lips and exhale slowly. You should be able to feel the hand on your abdomen moving in as you exhale.   Learn and use controlled coughing to clear mucus from your lungs. Controlled coughing is a series of short, progressive coughs. The steps of controlled coughing are:  1. Lean your head slightly forward.  2. Breathe in deeply using diaphragmatic breathing.  3. Try to hold your breath for 3 seconds.  4. Keep your mouth slightly open while coughing twice.  5. Spit any mucus out into a tissue.  6. Rest and repeat the steps once or twice as needed. SEEK MEDICAL CARE IF:   You are coughing up more mucus than usual.   There is a change in the color or thickness of your mucus.   Your breathing is more labored than usual.   Your breathing is faster than usual.  SEEK IMMEDIATE MEDICAL CARE IF:   You have shortness of breath while you are resting.   You have shortness of breath that prevents you from:  Being able to talk.   Performing your usual physical activities.   You have chest pain lasting longer than 5 minutes.   Your skin color is more cyanotic than usual.  You measure low oxygen saturations for longer than 5 minutes with a pulse oximeter. MAKE SURE YOU:   Understand these instructions.  Will watch your condition.  Will get help right away if you are not doing well or get worse. Document Released: 04/11/2005 Document Revised: 11/16/2013 Document Reviewed: 02/26/2013 [facility] Patient Information 2015 [place], [place]. This information is not intended to replace advice given to you by your health care provider. Make sure you discuss any questions you have with your health care provider.

## 2014-12-01 LAB — CULTURE, BLOOD (ROUTINE X 2)
Culture: NO GROWTH
Culture: NO GROWTH
Special Requests: NORMAL
Special Requests: NORMAL

## 2014-12-02 ENCOUNTER — Encounter: Payer: Self-pay | Admitting: Nurse Practitioner

## 2014-12-14 NOTE — Assessment & Plan Note (Signed)
Patient is 95% improved from when I saw her last! Referred to Pulmonology. FU prn worsening/failure to improve. Continue with current medications and quitting smoking.

## 2014-12-17 ENCOUNTER — Other Ambulatory Visit: Payer: Self-pay

## 2014-12-17 ENCOUNTER — Telehealth: Payer: Self-pay | Admitting: Nurse Practitioner

## 2014-12-17 DIAGNOSIS — R05 Cough: Secondary | ICD-10-CM

## 2014-12-17 DIAGNOSIS — R059 Cough, unspecified: Secondary | ICD-10-CM

## 2014-12-17 MED ORDER — BENZONATATE 100 MG PO CAPS
100.0000 mg | ORAL_CAPSULE | Freq: Three times a day (TID) | ORAL | Status: DC | PRN
Start: 2014-12-17 — End: 2015-03-01

## 2014-12-17 NOTE — Telephone Encounter (Signed)
Called patient and verified pharmacy, RX refill.

## 2014-12-17 NOTE — Telephone Encounter (Signed)
Please advise, Patient was at ER on 5/8 and was prescribed Benzonatate 1-2 by mouth every 8 hours for cough, #30.  Patient is requesting a refill?  Please advise.

## 2014-12-17 NOTE — Telephone Encounter (Signed)
Okay to send. Benzonatate 100 mg 1 cap by mouth every 8 hours for cough #30 with 1 refill. Thanks!

## 2014-12-17 NOTE — Telephone Encounter (Signed)
Refilled medication per C.Doss orders.  Patient requested advice on OTC cough medicine, gave suggestion of Delsym.  Patient confirmed that she is not vaping anymore and only had 2 cigarettes in the past month.

## 2014-12-17 NOTE — Telephone Encounter (Signed)
Pt was treated at the hospital for bronchitis. Pt request refill on benzonatate. Pt stated if she can't get a refill on this then something to help with the cough.msn

## 2014-12-21 ENCOUNTER — Encounter: Payer: Self-pay | Admitting: Nurse Practitioner

## 2014-12-22 ENCOUNTER — Ambulatory Visit: Payer: BLUE CROSS/BLUE SHIELD | Admitting: Endocrinology

## 2014-12-23 ENCOUNTER — Institutional Professional Consult (permissible substitution): Payer: BLUE CROSS/BLUE SHIELD | Admitting: Internal Medicine

## 2014-12-27 ENCOUNTER — Other Ambulatory Visit: Payer: Self-pay | Admitting: Internal Medicine

## 2014-12-27 NOTE — Telephone Encounter (Signed)
Last nonacute visit 3/29//16, ok refill?

## 2014-12-27 NOTE — Telephone Encounter (Signed)
Rx phoned into pharmacy.

## 2015-01-03 ENCOUNTER — Telehealth: Payer: Self-pay | Admitting: *Deleted

## 2015-01-03 NOTE — Telephone Encounter (Signed)
Left msg asked if patient is able to come in at 4 instead of 4:15. Asked patient to call back to confirm change.

## 2015-01-04 ENCOUNTER — Encounter: Payer: Self-pay | Admitting: Internal Medicine

## 2015-01-04 ENCOUNTER — Ambulatory Visit (INDEPENDENT_AMBULATORY_CARE_PROVIDER_SITE_OTHER): Payer: BLUE CROSS/BLUE SHIELD | Admitting: Internal Medicine

## 2015-01-04 VITALS — BP 124/60 | HR 98 | Temp 97.9°F | Ht 64.0 in | Wt 187.0 lb

## 2015-01-04 DIAGNOSIS — R059 Cough, unspecified: Secondary | ICD-10-CM

## 2015-01-04 DIAGNOSIS — E669 Obesity, unspecified: Secondary | ICD-10-CM

## 2015-01-04 DIAGNOSIS — Z720 Tobacco use: Secondary | ICD-10-CM | POA: Diagnosis not present

## 2015-01-04 DIAGNOSIS — R05 Cough: Secondary | ICD-10-CM | POA: Diagnosis not present

## 2015-01-04 DIAGNOSIS — J449 Chronic obstructive pulmonary disease, unspecified: Secondary | ICD-10-CM

## 2015-01-04 DIAGNOSIS — F17200 Nicotine dependence, unspecified, uncomplicated: Secondary | ICD-10-CM

## 2015-01-04 MED ORDER — BUDESONIDE-FORMOTEROL FUMARATE 160-4.5 MCG/ACT IN AERO: 2.0000 | INHALATION_SPRAY | Freq: Two times a day (BID) | RESPIRATORY_TRACT | Status: DC

## 2015-01-04 MED ORDER — IPRATROPIUM-ALBUTEROL 0.5-2.5 (3) MG/3ML IN SOLN
6.0000 mL | Freq: Once | RESPIRATORY_TRACT | Status: AC
  Administered 2015-01-04: 6 mL via RESPIRATORY_TRACT

## 2015-01-04 NOTE — Patient Instructions (Signed)
Follow up with Dr. Stevenson Clinch in 6 weeks - pulmonary function testing and 6 minute walk test prior to follow up - STOP Dulera - Symbicort 160/9 - 2 puff in the AM and 2 puff in the PM - gargle and rinse after each use - avoid tobacco - all forms (ecig, vapor, snuff, etc) - diet, exercise as tolerated

## 2015-01-04 NOTE — Progress Notes (Signed)
Date: 01/04/2015  MRN# [id number] Carly [name] 03/02/1971  Referring Physician:   SHALEE [name] is a 44 y.o. old female seen in consultation for hospital follow, COPD Evaluation  CC:  Chief Complaint  Patient presents with  . Advice Only    Referred by Carly [name]/bronchitis: Pt has dry cough that causes sob, facial redness. Pt has wheezing but no chest tightness. This has be ongoing for 2 months.    HPI:  Patient presents today for a hospital follow up visit.  Was recently admitted for Bronchitis suspected COPD, see below, treated for 24 hours with IV steroids, nebs and discharged on levaquin.  Since hospitalization patient states that she is using albuterol nebulizer twice a day for shortness of breath. Patient stated that since discharge she was given [facility], however she feels that her cough has been worse. Patient states prior hospitalization she had a morning cough (dry), which she attributed to smoking. Today she is noted to be acutely wheezing, she is coughing since this morning, patient states that her cough is nonproductive. Further history reveals that she has a yearly bronchitis, occurring for about the past 8 years, requiring anti-biotics. Patient states that this is the first time she's been hospitalized for her bronchitis. Patient previously smoked 1.5 packs per day for 27 years, she is currently on a nicotine patch but still smoking about 6-8 cigarettes per day. Patient currently works in a [employer], as a Teacher, adult education. She has a past medical history of hypertension, anxiety.    [facility] Hospitalization Review DATE OF ADMISSION: 11/26/2014  PRIMARY CARE PHYSICIAN: Carly Battiest, NP   REQUESTING/REFERRING PHYSICIAN: Marcos [name]  CHIEF COMPLAINT:  Chief Complaint  Patient presents with  . Shortness of Breath    HISTORY OF PRESENT ILLNESS: Carly [name] is a 44 y.o. female with a known history of nicotine addiction hypertension anxiety [name] who states  that she started having shortness of breath more than 2 weeks ago she was seen by her primary care provider on May 2 and was treated for bronchitis with Levaquin and guaifenesin. She continued to have shortness of breath cough and tightness in her chest. Patient's symptoms got worst, due to that she came to the ER today. Patient was very tachycardic and had accessory muscle usage and wheezing. She has received nebulizer treatments. She still feels short of breath. She has not had any fevers or chills. Denies any nausea vomiting or diarrhea    IMPRESSION AND PLAN: Patient is a 44 year old with history of nicotine addiction presents with shortness of breath and cough.   1. Acute respiratory failure due to possible underlying COPD exacerbation with acute bronchitis; at this time will treat her with nebulizers with Xopenex in light of her heart rate being elevated IV Solu-Medrol, and IV antibiotics. I have also started her on dulera. She will need outpatient pulmonary follow-up and PFTs.  2. Hypertension continueToprol-XL and amlodipine as taking at home.  3. Carly [name] continue clonazepam and Zoloft as taking at home.  4. Nicotine addiction smoking cessation done strongly recommended patient stop smoking 4 minutes spent patient will be started on a nicotine patch she is receptive to starting nicotine patch.    PMHX:   Past Medical History  Diagnosis Date  . Chicken pox   . Emphysema of lung   . Frequent headaches   . Hay fever   . Hypertension   . Anxiety   . Elevated testosterone level in female    Surgical Hx:  Past Surgical  History  Procedure Laterality Date  . Tubal ligation  1997  . Tubal ligation     Family Hx:  Family History  Problem Relation Age of Onset  . Hyperlipidemia Mother   . Heart disease Mother   . Stroke Mother   . Depression Mother   . Mental illness Mother   . Hyperlipidemia Father   . Depression Father   . Mental illness Father   . Diabetes Paternal  Grandfather   . Cancer Cousin    Social Hx:   History  Substance Use Topics  . Smoking status: Current Every Day Smoker -- 0.25 packs/day    Types: Cigarettes  . Smokeless tobacco: Never Used     Comment: Using Patch currently   . Alcohol Use: 4.8 oz/week    8 Standard drinks or equivalent per week     Comment: Beer (Can)    Medication:   Current Outpatient Rx  Name  Route  Sig  Dispense  Refill  . amLODipine (NORVASC) 5 MG tablet      take 1 tablet by mouth once daily   30 tablet   5   . benzonatate (TESSALON PERLES) 100 MG capsule   Oral   Take 1 capsule (100 mg total) by mouth every 8 (eight) hours as needed for cough.   30 capsule   1   . clonazePAM (KLONOPIN) 0.5 MG tablet      take 1 tablet by mouth at bedtime   30 tablet   2   . guaiFENesin (MUCINEX) 600 MG 12 hr tablet   Oral   Take 1 tablet (600 mg total) by mouth 2 (two) times daily.   30 tablet   0   . ibuprofen (ADVIL,MOTRIN) 200 MG tablet   Oral   Take 400-600 mg by mouth every 6 (six) hours as needed for headache.         . ipratropium-albuterol (DUONEB) 0.5-2.5 (3) MG/3ML SOLN   Nebulization   Take 3 mLs by nebulization every 4 (four) hours as needed.   360 mL   2   . metoprolol succinate (TOPROL-XL) 50 MG 24 hr tablet   Oral   Take 1 tablet (50 mg total) by mouth daily. Take with or immediately following a meal.   30 tablet   11   . mometasone-formoterol (DULERA) 100-5 MCG/ACT AERO   Inhalation   Inhale 2 puffs into the lungs 2 (two) times daily.   1 Inhaler   2   . nicotine (NICODERM CQ - DOSED IN MG/24 HOURS) 21 mg/24hr patch   Transdermal   Place 1 patch (21 mg total) onto the skin daily.   28 patch   0   . sertraline (ZOLOFT) 25 MG tablet      take 1 tablet by mouth once daily   30 tablet   2   . spironolactone (ALDACTONE) 25 MG tablet   Oral   Take 1 tablet (25 mg total) by mouth daily.   30 tablet   3   . vitamin B-12 (CYANOCOBALAMIN) 1000 MCG tablet   Oral    Take 1,000 mcg by mouth daily.             Allergies:  Review of patient's allergies indicates no known allergies.  Review of Systems: Gen:  Denies  fever, sweats, chills HEENT: Denies blurred vision, double vision, ear pain, eye pain, hearing loss, nose bleeds, sore throat Cvc:  No dizziness, chest pain or heaviness Resp:   Cough,  wheezing, shortness of breath, Gi: Denies swallowing difficulty, stomach pain, nausea or vomiting, diarrhea, constipation, bowel incontinence Gu:  Denies bladder incontinence, burning urine Ext:   No Joint pain, stiffness or swelling Skin: No skin rash, easy bruising or bleeding or hives Endoc:  No polyuria, polydipsia , polyphagia or weight change Psych: No depression, insomnia or hallucinations  Other:  All other systems negative  Physical Examination:   VS: BP 124/60 mmHg  Pulse 98  SpO2 98%  General Appearance: No distress  Neuro:without focal findings, mental status, speech normal, alert and oriented, cranial nerves 2-12 intact, reflexes normal and symmetric, sensation grossly normal  HEENT: PERRLA, EOM intact, no ptosis, no other lesions noticed; Mallampati 2 Pulmonary: PRE BRONCHODILATOR - diffuse wheezing, especially in the expiratory, moderate decrease in air movement at the bilateral bases.  POST BRONCHODILATOR- Good airway movement, no further wheezing noted, improved air movement at the bilateral bases, no crackles, no rales. Sputum production:none CardiovascularNormal S1,S2.  No m/r/g.  Abdominal aorta pulsation normal.    Abdomen: Benign, Soft, non-tender, No masses, hepatosplenomegaly, No lymphadenopathy Renal:  No costovertebral tenderness  GU:  No performed at this time. Endoc: No evident thyromegaly, no signs of acromegaly or Cushing features Skin:   warm, no rashes, no ecchymosis  Extremities: normal, no cyanosis, clubbing, no edema, warm with normal capillary refill. Other findings:none   Rad results: (The following images and  results were reviewed by Dr. Stevenson Clinch). May 2016 CHEST 2 VIEW  COMPARISON: None.  FINDINGS: Normal heart, mediastinum and hila.  Clear lungs. No pleural effusion or pneumothorax.  Bony thorax is intact.  IMPRESSION: No active cardiopulmonary disease.   Assessment and Plan: 44 year old female seen in consultation for COPD evaluation COPD (chronic obstructive pulmonary disease) Clinically, I believe that the patient does have COPD has specially with her history of recurrent bronchitis, morning cough, smoking history, recent hospitalization. During today's visit she was noted to have moderate wheezing and was given broncho-dilators 2 with significant improvement in her wheezing and respiratory status. Consultation greatly on tobacco avoidance and eventually quitting smoking altogether. Ruthe Mannan might be causing a worse cough that's cyclical in nature and triggering bronchospasms,therefore it was stopped. Symbicort was started today.  Plan: - pulmonary function testing and 6 minute walk test prior to follow up - STOP Dulera - Symbicort 160/90 - 2 puff in the AM and 2 puff in the PM - gargle and rinse after each use - avoid tobacco - all forms (ecig, vapor, snuff, etc) - diet, exercise as tolerated    Tobacco use [name] Tobacco Cessation - Counseling regarding benefits of smoking cessation strategies was provided for more than 12 min. - Educated that at this time smoking- cessation represents the single most important step that patient can take to enhance the length and quality of live. - Educated patient regarding alternatives of behavior interventions, pharmacotherapy including NRT and non-nicotine therapy such, and combinations of both. - Patient at this time will try to quit on her own, she still has nicotine patches.   Obesity (BMI 30-39.9) OBESITY  Discussed importance of weight reduction.  Educated regarding limitation of  intake of greasy/fried foods.   Instructed on benefit of  a low-impact exercise program, starting slowly.  Discussed benefits of 30-45 minutes of some form of exercise daily as well as benefit of supervised exercise program.       Updated Medication List Outpatient Encounter Prescriptions as of 01/04/2015  Medication Sig  . amLODipine (NORVASC) 5 MG tablet take 1 tablet  by mouth once daily  . benzonatate (TESSALON PERLES) 100 MG capsule Take 1 capsule (100 mg total) by mouth every 8 (eight) hours as needed for cough.  . clonazePAM (KLONOPIN) 0.5 MG tablet take 1 tablet by mouth at bedtime  . guaiFENesin (MUCINEX) 600 MG 12 hr tablet Take 1 tablet (600 mg total) by mouth 2 (two) times daily.  Marland Kitchen ibuprofen (ADVIL,MOTRIN) 200 MG tablet Take 400-600 mg by mouth every 6 (six) hours as needed for headache.  . ipratropium-albuterol (DUONEB) 0.5-2.5 (3) MG/3ML SOLN Take 3 mLs by nebulization every 4 (four) hours as needed.  . metoprolol succinate (TOPROL-XL) 50 MG 24 hr tablet Take 1 tablet (50 mg total) by mouth daily. Take with or immediately following a meal.  . mometasone-formoterol (DULERA) 100-5 MCG/ACT AERO Inhale 2 puffs into the lungs 2 (two) times daily.  . sertraline (ZOLOFT) 25 MG tablet take 1 tablet by mouth once daily  . spironolactone (ALDACTONE) 25 MG tablet Take 1 tablet (25 mg total) by mouth daily.  . vitamin B-12 (CYANOCOBALAMIN) 1000 MCG tablet Take 1,000 mcg by mouth daily.  . [DISCONTINUED] nicotine (NICODERM CQ - DOSED IN MG/24 HOURS) 21 mg/24hr patch Place 1 patch (21 mg total) onto the skin daily.  . budesonide-formoterol (SYMBICORT) 160-4.5 MCG/ACT inhaler Inhale 2 puffs into the lungs 2 (two) times daily.  . [DISCONTINUED] dexamethasone (DECADRON) 1 MG tablet   . [DISCONTINUED] levofloxacin (LEVAQUIN) 750 MG tablet   . [DISCONTINUED] methylPREDNISolone (MEDROL DOSEPAK) 4 MG TBPK tablet follow package directions (Patient not taking: Reported on 01/04/2015)  . ipratropium-albuterol (DUONEB) 0.5-2.5 (3)  MG/3ML nebulizer solution 6 mL    No facility-administered encounter medications on file as of 01/04/2015.    Orders for this visit: Orders Placed This Encounter  Procedures  . Pulmonary function test    Standing Status: Future     Number of Occurrences:      Standing Expiration Date: 01/04/2016    Order Specific Question:  Where should this test be performed?    Answer:  [facility]    Order Specific Question:  Full PFT: includes the following: basic spirometry, spirometry pre & post bronchodilator, diffusion capacity (DLCO), lung volumes    Answer:  Full PFT    Order Specific Question:  MIP/MEP    Answer:  No    Order Specific Question:  6 minute walk    Answer:  Yes    Order Specific Question:  ABG    Answer:  No    Order Specific Question:  Diffusion capacity (DLCO)    Answer:  No    Order Specific Question:  Lung volumes    Answer:  No    Order Specific Question:  Methacholine challenge    Answer:  No     Thank  you for the consultation and for allowing [facility] to assist in the care of your patient. Our recommendations are noted above.  Please contact us if we can be of further service.   Vilinda Boehringer, MD [facility] Office Number: [phone]

## 2015-01-05 ENCOUNTER — Other Ambulatory Visit: Payer: Self-pay | Admitting: *Deleted

## 2015-01-05 ENCOUNTER — Other Ambulatory Visit: Payer: Self-pay | Admitting: Nurse Practitioner

## 2015-01-08 ENCOUNTER — Telehealth: Payer: Self-pay | Admitting: Nurse Practitioner

## 2015-01-08 DIAGNOSIS — J0110 Acute frontal sinusitis, unspecified: Secondary | ICD-10-CM

## 2015-01-08 MED ORDER — AMOXICILLIN-POT CLAVULANATE 875-125 MG PO TABS: 1.0000 | ORAL_TABLET | Freq: Two times a day (BID) | ORAL | Status: DC

## 2015-01-08 NOTE — Progress Notes (Signed)

## 2015-01-12 ENCOUNTER — Ambulatory Visit: Payer: BLUE CROSS/BLUE SHIELD | Admitting: Endocrinology

## 2015-01-13 DIAGNOSIS — J4489 Other specified chronic obstructive pulmonary disease: Secondary | ICD-10-CM | POA: Insufficient documentation

## 2015-01-13 DIAGNOSIS — J449 Chronic obstructive pulmonary disease, unspecified: Secondary | ICD-10-CM | POA: Insufficient documentation

## 2015-01-13 NOTE — Assessment & Plan Note (Signed)
Tobacco Cessation - Counseling regarding benefits of smoking cessation strategies was provided for more than 12 min. - Educated that at this time smoking- cessation represents the single most important step that patient can take to enhance the length and quality of live. - Educated patient regarding alternatives of behavior interventions, pharmacotherapy including NRT and non-nicotine therapy such, and combinations of both. - Patient at this time will try to quit on her own, she still has nicotine patches.

## 2015-01-13 NOTE — Assessment & Plan Note (Signed)
Clinically, I believe that the patient does have COPD has specially with her history of recurrent bronchitis, morning cough, smoking history, recent hospitalization. During today's visit she was noted to have moderate wheezing and was given broncho-dilators 2 with significant improvement in her wheezing and respiratory status. Consultation greatly on tobacco avoidance and eventually quitting smoking altogether. Ruthe Mannan might be causing a worse cough that's cyclical in nature and triggering bronchospasms,therefore it was stopped. Symbicort was started today.  Plan: - pulmonary function testing and 6 minute walk test prior to follow up - STOP Dulera - Symbicort 160/90 - 2 puff in the AM and 2 puff in the PM - gargle and rinse after each use - avoid tobacco - all forms (ecig, vapor, snuff, etc) - diet, exercise as tolerated

## 2015-01-13 NOTE — Assessment & Plan Note (Signed)
OBESITY  Discussed importance of weight reduction.  Educated regarding limitation of  intake of greasy/fried foods.  Instructed on benefit of  a low-impact exercise program, starting slowly.  Discussed benefits of 30-45 minutes of some form of exercise daily as well as benefit of supervised exercise program.    

## 2015-02-21 ENCOUNTER — Telehealth: Payer: BLUE CROSS/BLUE SHIELD | Admitting: Family

## 2015-02-21 DIAGNOSIS — M549 Dorsalgia, unspecified: Secondary | ICD-10-CM

## 2015-02-21 MED ORDER — ETODOLAC 300 MG PO CAPS: 300.0000 mg | ORAL_CAPSULE | Freq: Two times a day (BID) | ORAL | Status: DC

## 2015-02-21 MED ORDER — CYCLOBENZAPRINE HCL 10 MG PO TABS: 10.0000 mg | ORAL_TABLET | Freq: Three times a day (TID) | ORAL | Status: DC | PRN

## 2015-02-21 NOTE — Progress Notes (Signed)
We are sorry that you are not feeling well.  Here is how we plan to help!  Based on what you have shared with me it looks like you mostly have acute back pain.  Acute back pain is defined as musculoskeletal pain that can resolve in 1-3 weeks with conservative treatment.  I have prescribed Etodolac 300 mg twice a day non-steroid anti-inflammatory (NSAID) as well as Flexeril 10 mg every eight hours as needed which is a muscle relaxer.  Some patients experience stomach irritation or in increased heartburn with anti-inflammatory drugs.  Please keep in mind that muscle relaxer's can cause fatigue and should not be taken while at work or driving.  Back pain is very common.  The pain often gets better over time.  The cause of back pain is usually not dangerous.  Most people can learn to manage their back pain on their own.  If this pain does not improve you will need to follow up with your primary care provider.   Home Care  Stay active.  Start with short walks on flat ground if you can.  Try to walk farther each day.  Do not sit, drive or stand in one place for more than 30 minutes.  Do not stay in bed.  Do not avoid exercise or work.  Activity can help your back heal faster.  Be careful when you bend or lift an object.  Bend at your knees, keep the object close to you, and do not twist.  Sleep on a firm mattress.  Lie on your side, and bend your knees.  If you lie on your back, put a pillow under your knees.  Only take medicines as told by your doctor.  Put ice on the injured area.  Put ice in a plastic bag  Place a towel between your skin and the bag  Leave the ice on for 15-20 minutes, 3-4 times a day for the first 2-3 days.  After that, you can switch between ice and heat packs.  Ask your doctor about back exercises or massage.  Avoid feeling anxious or stressed.  Find good ways to deal with stress, such as exercise.  Get Help Right Way If:  Your pain does not go away with rest or  medicine.  Your pain does not go away in 1 week.  You have new problems.  You do not feel well.  The pain spreads into your legs.  You cannot control when you poop (bowel movement) or pee (urinate)  You feel sick to your stomach (nauseous) or throw up (vomit)  You have belly (abdominal) pain.  You feel like you may pass out (faint).  If you develop a fever.  Make Sure you:  Understand these instructions.  Will watch your condition  Will get help right away if you are not doing well or get worse.  Your e-visit answers were reviewed by a board certified advanced clinical practitioner to complete your personal care plan.  Depending on the condition, your plan could have included both over the counter or prescription medications.  If there is a problem please reply  once you have received a response from your provider.  Your safety is important to [place].  If you have drug allergies check your prescription carefully.    You can use MyChart to ask questions about today's visit, request a non-urgent call back, or ask for a work or school excuse.  You will get an e-mail in the next two days asking  about your experience.  I hope that your e-visit has been valuable and will speed your recovery. Thank you for using e-visits.

## 2015-02-24 ENCOUNTER — Encounter: Payer: Self-pay | Admitting: Nurse Practitioner

## 2015-02-24 ENCOUNTER — Telehealth: Payer: BLUE CROSS/BLUE SHIELD | Admitting: Physician Assistant

## 2015-02-24 ENCOUNTER — Other Ambulatory Visit: Payer: Self-pay | Admitting: Nurse Practitioner

## 2015-02-24 DIAGNOSIS — L259 Unspecified contact dermatitis, unspecified cause: Secondary | ICD-10-CM

## 2015-02-24 MED ORDER — PREDNISONE 10 MG (21) PO TBPK: ORAL_TABLET | ORAL | Status: DC

## 2015-02-24 NOTE — Progress Notes (Signed)

## 2015-03-01 ENCOUNTER — Ambulatory Visit (INDEPENDENT_AMBULATORY_CARE_PROVIDER_SITE_OTHER): Payer: BLUE CROSS/BLUE SHIELD | Admitting: Nurse Practitioner

## 2015-03-01 VITALS — BP 138/82 | HR 98 | Temp 98.4°F | Resp 16 | Ht 64.0 in | Wt 189.4 lb

## 2015-03-01 DIAGNOSIS — N3946 Mixed incontinence: Secondary | ICD-10-CM

## 2015-03-01 MED ORDER — OXYBUTYNIN CHLORIDE ER 10 MG PO TB24: 10.0000 mg | ORAL_TABLET | Freq: Every day | ORAL | Status: DC

## 2015-03-01 NOTE — Progress Notes (Signed)
Patient ID: Carly [name], female    DOB: Aug 17, 1970  Age: 44 y.o. MRN: [id number]  CC: Urinary Incontinence   HPI Carly [name] presents for urinary incontinence x approximately one year.  1) Dribbling, poise inserts, pads overflows and changing clothes during the day Ongoing for 1 year  Kegals at home for 2-3 months, tries to daily   5 reps for 2 sets  Urgency, stress- coughs, sneezing, lifting  Caffeine- 2 cups daily  2 vaginal births  Denies vaginal procedures   History Carly [name] has a past medical history of Chicken pox; Emphysema of lung; Frequent headaches; Hay fever; Hypertension; Anxiety; and Elevated testosterone level in female.   She has past surgical history that includes Tubal ligation (1997) and Tubal ligation.   Her family history includes Cancer in her cousin; Depression in her father and mother; Diabetes in her paternal grandfather; Heart disease in her mother; Hyperlipidemia in her father and mother; Mental illness in her father and mother; Stroke in her mother.She reports that she has been smoking Cigarettes.  She has been smoking about 0.25 packs per day. She has never used smokeless tobacco. She reports that she drinks about 4.8 oz of alcohol per week. She reports that she does not use illicit drugs.  Outpatient Prescriptions Prior to Visit  Medication Sig Dispense Refill  . amLODipine (NORVASC) 5 MG tablet take 1 tablet by mouth once daily 30 tablet 5  . budesonide-formoterol (SYMBICORT) 160-4.5 MCG/ACT inhaler Inhale 2 puffs into the lungs 2 (two) times daily. 1 Inhaler 6  . clonazePAM (KLONOPIN) 0.5 MG tablet take 1 tablet by mouth at bedtime 30 tablet 2  . cyclobenzaprine (FLEXERIL) 10 MG tablet Take 1 tablet (10 mg total) by mouth 3 (three) times daily as needed for muscle spasms. 30 tablet 0  . etodolac (LODINE) 300 MG capsule Take 1 capsule (300 mg total) by mouth 2 (two) times daily. 60 capsule 0  . ibuprofen (ADVIL,MOTRIN) 200 MG tablet Take 400-600 mg by  mouth every 6 (six) hours as needed for headache.    . ipratropium-albuterol (DUONEB) 0.5-2.5 (3) MG/3ML SOLN Take 3 mLs by nebulization every 4 (four) hours as needed. 360 mL 2  . metoprolol succinate (TOPROL-XL) 50 MG 24 hr tablet Take 1 tablet (50 mg total) by mouth daily. Take with or immediately following a meal. 30 tablet 11  . NICODERM CQ 21 MG/24HR patch place 1 patch ONTO THE SKIN daily 28 patch 0  . sertraline (ZOLOFT) 25 MG tablet take 1 tablet by mouth once daily 30 tablet 2  . spironolactone (ALDACTONE) 25 MG tablet Take 1 tablet (25 mg total) by mouth daily. 30 tablet 3  . vitamin B-12 (CYANOCOBALAMIN) 1000 MCG tablet Take 1,000 mcg by mouth daily.    Marland Kitchen amoxicillin-clavulanate (AUGMENTIN) 875-125 MG per tablet Take 1 tablet by mouth 2 (two) times daily. (Patient not taking: Reported on 03/01/2015) 20 tablet 0  . benzonatate (TESSALON PERLES) 100 MG capsule Take 1 capsule (100 mg total) by mouth every 8 (eight) hours as needed for cough. (Patient not taking: Reported on 03/01/2015) 30 capsule 1  . guaiFENesin (MUCINEX) 600 MG 12 hr tablet Take 1 tablet (600 mg total) by mouth 2 (two) times daily. (Patient not taking: Reported on 03/01/2015) 30 tablet 0  . mometasone-formoterol (DULERA) 100-5 MCG/ACT AERO Inhale 2 puffs into the lungs 2 (two) times daily. (Patient not taking: Reported on 03/01/2015) 1 Inhaler 2  . predniSONE (STERAPRED UNI-PAK 21 TAB) 10 MG (21) TBPK  tablet Take following package directions. (Patient not taking: Reported on 03/01/2015) 21 tablet 0   No facility-administered medications prior to visit.    ROS Review of Systems  Constitutional: Negative for fever, chills, diaphoresis and fatigue.  Genitourinary: Positive for urgency and frequency. Negative for dysuria, flank pain, vaginal bleeding, vaginal discharge, enuresis, difficulty urinating, vaginal pain and dyspareunia.    Objective:  BP 138/82 mmHg  Pulse 98  Temp(Src) 98.4 F (36.9 C)  Resp 16  Ht 5\' 4"   (1.626 m)  Wt 189 lb 6.4 oz (85.911 kg)  BMI 32.49 kg/m2  SpO2 97%  Physical Exam  Constitutional: She is oriented to person, place, and time. She appears well-developed and well-nourished. No distress.  HENT:  Head: Normocephalic and atraumatic.  Right Ear: External ear normal.  Left Ear: External ear normal.  Abdominal: There is no CVA tenderness.  Genitourinary:  Deferred  Neurological: She is alert and oriented to person, place, and time.  Skin: Skin is warm and dry. No rash noted. She is not diaphoretic.  Psychiatric: She has a normal mood and affect. Her behavior is normal. Judgment and thought content normal.      Assessment & Plan:   Carly [name] was seen today for urinary incontinence.  Diagnoses and all orders for this visit:  Mixed incontinence  Other orders -     oxybutynin (DITROPAN-XL) 10 MG 24 hr tablet; Take 1 tablet (10 mg total) by mouth at bedtime.   I have discontinued Carly [name]'s guaiFENesin, mometasone-formoterol, benzonatate, amoxicillin-clavulanate, and predniSONE. I am also having her start on oxybutynin. Additionally, I am having her maintain her spironolactone, metoprolol succinate, amLODipine, vitamin B-12, ibuprofen, ipratropium-albuterol, clonazePAM, budesonide-formoterol, NICODERM CQ, cyclobenzaprine, etodolac, and sertraline.  Meds ordered this encounter  Medications  . oxybutynin (DITROPAN-XL) 10 MG 24 hr tablet    Sig: Take 1 tablet (10 mg total) by mouth at bedtime.    Dispense:  30 tablet    Refill:  0    Order Specific Question:  Supervising Provider    Answer:  Crecencio Mc [[id number]]     Follow-up: Return in about 2 weeks (around 03/15/2015) for Incontinence- Urinary .

## 2015-03-01 NOTE — Progress Notes (Signed)
Pre visit review using our clinic review tool, if applicable. No additional management support is needed unless otherwise documented below in the visit note. 

## 2015-03-01 NOTE — Patient Instructions (Signed)
Urinary Incontinence Urinary incontinence is the involuntary loss of urine from your bladder. CAUSES  There are many causes of urinary incontinence. They include:  Medicines.  Infections.  Prostatic enlargement, leading to overflow of urine from your bladder.  Surgery.  Neurological diseases.  Emotional factors. SIGNS AND SYMPTOMS Urinary Incontinence can be divided into four types: 1. Urge incontinence. Urge incontinence is the involuntary loss of urine before you have the opportunity to go to the bathroom. There is a sudden urge to void but not enough time to reach a bathroom. 2. Stress incontinence. Stress incontinence is the sudden loss of urine with any activity that forces urine to pass. It is commonly caused by anatomical changes to the pelvis and sphincter areas of your body. 3. Overflow incontinence. Overflow incontinence is the loss of urine from an obstructed opening to your bladder. This results in a backup of urine and a resultant buildup of pressure within the bladder. When the pressure within the bladder exceeds the closing pressure of the sphincter, the urine overflows, which causes incontinence, similar to water overflowing a dam. 4. Total incontinence. Total incontinence is the loss of urine as a result of the inability to store urine within your bladder. DIAGNOSIS  Evaluating the cause of incontinence may require:  A thorough and complete medical and obstetric history.  A complete physical exam.  Laboratory tests such as a urine culture and sensitivities. When additional tests are indicated, they can include:  An ultrasound exam.  Kidney and bladder X-rays.  Cystoscopy. This is an exam of the bladder using a narrow scope.  Urodynamic testing to test the nerve function to the bladder and sphincter areas. TREATMENT  Treatment for urinary incontinence depends on the cause:  For urge incontinence caused by a bacterial infection, antibiotics will be prescribed.  If the urge incontinence is related to medicines you take, your health care provider may have you change the medicine.  For stress incontinence, surgery to re-establish anatomical support to the bladder or sphincter, or both, will often correct the condition.  For overflow incontinence caused by an enlarged prostate, an operation to open the channel through the enlarged prostate will allow the flow of urine out of the bladder. In women with fibroids, a hysterectomy may be recommended.  For total incontinence, surgery on your urinary sphincter may help. An artificial urinary sphincter (an inflatable cuff placed around the urethra) may be required. In women who have developed a hole-like passage between their bladder and vagina (vesicovaginal fistula), surgery to close the fistula often is required. HOME CARE INSTRUCTIONS  Normal daily hygiene and the use of pads or adult diapers that are changed regularly will help prevent odors and skin damage.  Avoid caffeine. It can overstimulate your bladder.  Use the bathroom regularly. Try about every 2-3 hours to go to the bathroom, even if you do not feel the need to do so. Take time to empty your bladder completely. After urinating, wait a minute. Then try to urinate again.  For causes involving nerve dysfunction, keep a log of the medicines you take and a journal of the times you go to the bathroom. SEEK MEDICAL CARE IF:  You experience worsening of pain instead of improvement in pain after your procedure.  Your incontinence becomes worse instead of better. SEE IMMEDIATE MEDICAL CARE IF:  You experience fever or shaking chills.  You are unable to pass your urine.  You have redness spreading into your groin or down into your thighs. MAKE SURE   YOU:   Understand these instructions.   Will watch your condition.  Will get help right away if you are not doing well or get worse. Document Released: 08/09/2004 Document Revised: 04/22/2013 Document  Reviewed: 12/09/2012 ExitCare Patient Information 2015 [employer]. This information is not intended to replace advice given to you by your health care provider. Make sure you discuss any questions you have with your health care provider.  

## 2015-03-09 ENCOUNTER — Encounter: Payer: Self-pay | Admitting: Nurse Practitioner

## 2015-03-09 DIAGNOSIS — N3946 Mixed incontinence: Secondary | ICD-10-CM | POA: Insufficient documentation

## 2015-03-09 NOTE — Assessment & Plan Note (Addendum)
Patient worsening incontinence. We'll try oxybutynin 10 mg daily. Gave patient handout on [place] exercises. Discussed recommendations on a AVS. Will follow up in 2 weeks.

## 2015-03-31 ENCOUNTER — Other Ambulatory Visit: Payer: Self-pay | Admitting: Nurse Practitioner

## 2015-04-08 ENCOUNTER — Other Ambulatory Visit: Payer: Self-pay | Admitting: Nurse Practitioner

## 2015-04-29 ENCOUNTER — Other Ambulatory Visit: Payer: Self-pay | Admitting: Nurse Practitioner

## 2015-04-29 ENCOUNTER — Encounter: Payer: Self-pay | Admitting: Nurse Practitioner

## 2015-04-29 NOTE — Telephone Encounter (Signed)
Sent to Dr. Caryl Bis on accident.

## 2015-04-29 NOTE — Telephone Encounter (Signed)
I did not speak with the patient. Please send to [facility]. Thanks.

## 2015-04-29 NOTE — Telephone Encounter (Signed)
Pt called to check if her prescription for clonazePAM (KLONOPIN) 0.5 MG tablet and NICODERM CQ 21 MG/24HR patch was called into the pharmacy. Pharmacy is [facility] on [address] [place]. Thank You!

## 2015-04-29 NOTE — Telephone Encounter (Signed)
Dr Sonnenberg spoke with patient  

## 2015-04-29 NOTE — Telephone Encounter (Signed)
Please advise refill? 

## 2015-06-01 ENCOUNTER — Encounter: Payer: Self-pay | Admitting: Nurse Practitioner

## 2015-06-29 ENCOUNTER — Other Ambulatory Visit: Payer: Self-pay | Admitting: Nurse Practitioner

## 2015-08-10 ENCOUNTER — Encounter: Payer: Self-pay | Admitting: Nurse Practitioner

## 2015-08-10 ENCOUNTER — Other Ambulatory Visit: Payer: Self-pay | Admitting: Nurse Practitioner

## 2015-08-10 NOTE — Telephone Encounter (Signed)
Medication was refilled in October and was last seen for anxiety in February.  Please advise?

## 2015-08-12 ENCOUNTER — Other Ambulatory Visit: Payer: Self-pay | Admitting: Nurse Practitioner

## 2015-08-23 ENCOUNTER — Ambulatory Visit (INDEPENDENT_AMBULATORY_CARE_PROVIDER_SITE_OTHER): Payer: BLUE CROSS/BLUE SHIELD | Admitting: Internal Medicine

## 2015-08-23 ENCOUNTER — Encounter: Payer: Self-pay | Admitting: Internal Medicine

## 2015-08-23 VITALS — BP 138/88 | HR 111 | Temp 98.5°F | Resp 14 | Ht 63.0 in | Wt 190.5 lb

## 2015-08-23 DIAGNOSIS — J441 Chronic obstructive pulmonary disease with (acute) exacerbation: Secondary | ICD-10-CM | POA: Diagnosis not present

## 2015-08-23 DIAGNOSIS — R05 Cough: Secondary | ICD-10-CM

## 2015-08-23 DIAGNOSIS — J029 Acute pharyngitis, unspecified: Secondary | ICD-10-CM

## 2015-08-23 DIAGNOSIS — R059 Cough, unspecified: Secondary | ICD-10-CM

## 2015-08-23 LAB — POCT RAPID STREP A (OFFICE): Rapid Strep A Screen: NEGATIVE

## 2015-08-23 LAB — POCT INFLUENZA A/B
Influenza A, POC: NEGATIVE
Influenza B, POC: NEGATIVE

## 2015-08-23 MED ORDER — GUAIFENESIN-CODEINE 100-10 MG/5ML PO SOLN: 5.0000 mL | Freq: Three times a day (TID) | ORAL | Status: DC | PRN

## 2015-08-23 MED ORDER — METHYLPREDNISOLONE ACETATE 40 MG/ML IJ SUSP
40.0000 mg | Freq: Once | INTRAMUSCULAR | Status: AC
  Administered 2015-08-23: 40 mg via INTRAMUSCULAR

## 2015-08-23 MED ORDER — PREDNISONE 10 MG PO TABS: ORAL_TABLET | ORAL | Status: DC

## 2015-08-23 MED ORDER — LEVOFLOXACIN 500 MG PO TABS: 500.0000 mg | ORAL_TABLET | Freq: Every day | ORAL | Status: DC

## 2015-08-23 MED ORDER — ALBUTEROL SULFATE (2.5 MG/3ML) 0.083% IN NEBU
2.5000 mg | INHALATION_SOLUTION | Freq: Once | RESPIRATORY_TRACT | Status: AC
  Administered 2015-08-23: 2.5 mg via RESPIRATORY_TRACT

## 2015-08-23 NOTE — Patient Instructions (Signed)
You are having a COPD exacerbation   If you become more short of breath than you are now,  You need to go to the nearest ER immediately or call 911  I am treating you  with the following:  Prednisone tapering dose for the next 6 days   Levaquin  daily with food  7 days (antibiotic) Continue your symbicort twice  Daily and use your nebulizer every 4 hours as needed for chest tightness and wheezing .   Cheratussin for cough suppression.  Please take a probiotic ( Align, Floraque or Culturelle), or  the generic version of one of these  For a minimum of 3 weeks to prevent a serious antibiotic associated diarrhea  Called clostridium dificile colitis

## 2015-08-23 NOTE — Progress Notes (Signed)
Subjective:  Patient ID: Carly [name], female    DOB: 09-12-1970  Age: 45 y.o. MRN: [id number]  CC: The primary encounter diagnosis was Cough. Diagnoses of Sorethroat and COPD exacerbation ([place]) were also pertinent to this visit.  HPI Carly [name] presents for flu like  symptoms that started on Sunday with sore throat,  Headache,  Body aches and chills started on Monday .  Some nausea started this morning,  Has been taking  Excessive ibuprofen (10 tablets in last 24 hours  )  No loose stools.  Smokes 1/4 pack cigarettes daily.  History of COPD.    Outpatient Prescriptions Prior to Visit  Medication Sig Dispense Refill  . amLODipine (NORVASC) 5 MG tablet take 1 tablet by mouth once daily 30 tablet 5  . budesonide-formoterol (SYMBICORT) 160-4.5 MCG/ACT inhaler Inhale 2 puffs into the lungs 2 (two) times daily. 1 Inhaler 6  . ibuprofen (ADVIL,MOTRIN) 200 MG tablet Take 400-600 mg by mouth every 6 (six) hours as needed for headache.    . ipratropium-albuterol (DUONEB) 0.5-2.5 (3) MG/3ML SOLN inhale contents of 1 vial every 4 hours if needed in nebulizer 3 mL 2  . metoprolol succinate (TOPROL-XL) 50 MG 24 hr tablet Take 1 tablet (50 mg total) by mouth daily. Take with or immediately following a meal. 30 tablet 11  . sertraline (ZOLOFT) 25 MG tablet take 1 tablet by mouth once daily 30 tablet 2  . spironolactone (ALDACTONE) 25 MG tablet take 1 tablet by mouth once daily 30 tablet 3  . vitamin B-12 (CYANOCOBALAMIN) 1000 MCG tablet Take 1,000 mcg by mouth daily.    . cyclobenzaprine (FLEXERIL) 10 MG tablet Take 1 tablet (10 mg total) by mouth 3 (three) times daily as needed for muscle spasms. (Patient not taking: Reported on 08/23/2015) 30 tablet 0  . NICODERM CQ 21 MG/24HR patch apply 1 patch topically daily (Patient not taking: Reported on 08/23/2015) 28 patch 0  . oxybutynin (DITROPAN-XL) 10 MG 24 hr tablet take 1 tablet by mouth at bedtime (Patient not taking: Reported on 08/23/2015) 30 tablet 0  .  clonazePAM (KLONOPIN) 0.5 MG tablet take 1 tablet by mouth at bedtime (Patient not taking: Reported on 08/23/2015) 30 tablet 2  . clonazePAM (KLONOPIN) 0.5 MG tablet take 1 tablet by mouth at bedtime (Patient not taking: Reported on 08/23/2015) 30 tablet 2  . etodolac (LODINE) 300 MG capsule Take 1 capsule (300 mg total) by mouth 2 (two) times daily. (Patient not taking: Reported on 08/23/2015) 60 capsule 0   No facility-administered medications prior to visit.    Review of Systems;  Patient denies headache, fevers, malaise, unintentional weight loss, skin rash, eye pain, sinus congestion and sinus pain, sore throat, dysphagia,  hemoptysis , cough, dyspnea, wheezing, chest pain, palpitations, orthopnea, edema, abdominal pain, nausea, melena, diarrhea, constipation, flank pain, dysuria, hematuria, urinary  Frequency, nocturia, numbness, tingling, seizures,  Focal weakness, Loss of consciousness,  Tremor, insomnia, depression, anxiety, and suicidal ideation.      Objective:  BP 138/88 mmHg  Pulse 111  Temp(Src) 98.5 F (36.9 C) (Oral)  Resp 14  Ht 5\' 3"  (1.6 m)  Wt 190 lb 8 oz (86.41 kg)  BMI 33.75 kg/m2  SpO2 99%  LMP 08/22/2015  BP Readings from Last 3 Encounters:  08/23/15 138/88  03/01/15 138/82  01/04/15 124/60    Wt Readings from Last 3 Encounters:  08/23/15 190 lb 8 oz (86.41 kg)  03/01/15 189 lb 6.4 oz (85.911 kg)  01/04/15  187 lb (84.823 kg)    General appearance: alert, cooperative and appears ill.  No respiratory distress Ears: normal TM's and external ear canals both ears Throat: lips, mucosa, and tongue normal; teeth and gums normal Neck: no adenopathy, no carotid bruit, supple, symmetrical, trachea midline and thyroid not enlarged, symmetric, no tenderness/mass/nodules Back: symmetric, no curvature. ROM normal. No CVA tenderness. Lungs: clear to auscultation bilaterally Heart: regular rate and rhythm, S1, S2 normal, no murmur, click, rub or gallop Abdomen: soft,  non-tender; bowel sounds normal; no masses,  no organomegaly Pulses: 2+ and symmetric Skin: Skin color, texture, turgor normal. No rashes or lesions Lymph nodes: Cervical, supraclavicular, and axillary nodes normal.  Lab Results  Component Value Date   HGBA1C 4.9 09/07/2014    Lab Results  Component Value Date   CREATININE 0.60 11/27/2014   CREATININE 0.86 11/26/2014   CREATININE 0.77 11/26/2014    Lab Results  Component Value Date   WBC 15.7* 11/27/2014   HGB 13.7 11/27/2014   HCT 40.9 11/27/2014   PLT 119* 11/27/2014   GLUCOSE 164* 11/27/2014   CHOL 195 09/07/2014   TRIG 234.0* 09/07/2014   HDL 62.10 09/07/2014   LDLDIRECT 102.0 09/07/2014   LDLCALC 132 10/22/2012   ALT 12 09/07/2014   AST 16 09/07/2014   NA 138 11/27/2014   K 4.1 11/27/2014   CL 108 11/27/2014   CREATININE 0.60 11/27/2014   BUN 8 11/27/2014   CO2 22 11/27/2014   TSH 2.10 09/07/2014   HGBA1C 4.9 09/07/2014    Dg Chest 2 View  11/26/2014  CLINICAL DATA:  Diagnosed with bronchitis 10 days ago has been taking an antibiotic, Pt is not feeling better. C/o shortness of breath, non-productive cough. Smoker for 27 years ago. EXAM: CHEST  2 VIEW COMPARISON:  None. FINDINGS: Normal heart, mediastinum and hila. Clear lungs.  No pleural effusion or pneumothorax. Bony thorax is intact. IMPRESSION: No active cardiopulmonary disease. Electronically Signed   By: Lajean Manes M.D.   On: 11/26/2014 15:13    Assessment & Plan:   Problem List Items Addressed This Visit    COPD exacerbation ([place])    Flu and rapid strep tests are negative.  Exam is c/w COPD exacerbation.  Prednisone taper, broad spectrum antibiotics, albuterol nebs and cough suppressant. Probiotic advised       Relevant Medications   guaiFENesin-codeine 100-10 MG/5ML syrup   predniSONE (DELTASONE) 10 MG tablet   methylPREDNISolone acetate (DEPO-MEDROL) injection 40 mg (Completed)   albuterol (PROVENTIL) (2.5 MG/3ML) 0.083% nebulizer solution 2.5  mg (Completed)    Other Visit Diagnoses    Cough    -  Primary    Relevant Medications    methylPREDNISolone acetate (DEPO-MEDROL) injection 40 mg (Completed)    albuterol (PROVENTIL) (2.5 MG/3ML) 0.083% nebulizer solution 2.5 mg (Completed)    Other Relevant Orders    POCT Influenza A/B (Completed)    POCT rapid strep A (Completed)    Sorethroat        Relevant Medications    methylPREDNISolone acetate (DEPO-MEDROL) injection 40 mg (Completed)    albuterol (PROVENTIL) (2.5 MG/3ML) 0.083% nebulizer solution 2.5 mg (Completed)    Other Relevant Orders    POCT Influenza A/B (Completed)    POCT rapid strep A (Completed)       I have discontinued Ms. Toole's etodolac, clonazePAM, and clonazePAM. I am also having her start on levofloxacin and predniSONE. Additionally, I am having her maintain her metoprolol succinate, amLODipine, vitamin B-12, ibuprofen, budesonide-formoterol, cyclobenzaprine,  spironolactone, oxybutynin, NICODERM CQ, sertraline, ipratropium-albuterol, and guaiFENesin-codeine. We administered methylPREDNISolone acetate and albuterol.  Meds ordered this encounter  Medications  . guaiFENesin-codeine 100-10 MG/5ML syrup    Sig: Take 5 mLs by mouth 3 (three) times daily as needed for cough.    Dispense:  118 mL    Refill:  0  . levofloxacin (LEVAQUIN) 500 MG tablet    Sig: Take 1 tablet (500 mg total) by mouth daily.    Dispense:  7 tablet    Refill:  0  . predniSONE (DELTASONE) 10 MG tablet    Sig: 6 tablets on Day 1 , then reduce by 1 tablet daily until gone    Dispense:  21 tablet    Refill:  0  . methylPREDNISolone acetate (DEPO-MEDROL) injection 40 mg    Sig:   . albuterol (PROVENTIL) (2.5 MG/3ML) 0.083% nebulizer solution 2.5 mg    Sig:     Medications Discontinued During This Encounter  Medication Reason  . guaiFENesin-codeine 100-10 MG/5ML syrup Reorder  . clonazePAM (KLONOPIN) 0.5 MG tablet   . clonazePAM (KLONOPIN) 0.5 MG tablet   . etodolac (LODINE)  300 MG capsule     Follow-up: No Follow-up on file.   Crecencio Mc, MD

## 2015-08-23 NOTE — Progress Notes (Signed)
Pre-visit discussion using our clinic review tool. No additional management support is needed unless otherwise documented below in the visit note.  

## 2015-08-24 NOTE — Assessment & Plan Note (Addendum)
Flu and rapid strep tests are negative.  Exam is c/w COPD exacerbation. Nebulized albuterol and IM Depo medrol given in office with improvement in symptoms.  Prednisone taper, broad spectrum antibiotics, albuterol nebs and cough suppressant. Probiotic advised

## 2015-08-24 NOTE — Progress Notes (Signed)
Subjective:  Patient ID: Carly [name], female    DOB: 04-04-1971  Age: 45 y.o. MRN: [id number]  CC: The primary encounter diagnosis was Cough. Diagnoses of Sorethroat and COPD exacerbation ([place]) were also pertinent to this visit.  HPI Carly [name] presents for flu like  symptoms that started on Sunday with sore throat,  Headache,  Body aches and chills started on Monday .  Some nausea started this morning,  Has been taking  Excessive ibuprofen (10 tablets in last 24 hours  )  No loose stools.  Smokes 1/4 pack cigarettes daily.  History of COPD.    Outpatient Prescriptions Prior to Visit  Medication Sig Dispense Refill  . amLODipine (NORVASC) 5 MG tablet take 1 tablet by mouth once daily 30 tablet 5  . budesonide-formoterol (SYMBICORT) 160-4.5 MCG/ACT inhaler Inhale 2 puffs into the lungs 2 (two) times daily. 1 Inhaler 6  . ibuprofen (ADVIL,MOTRIN) 200 MG tablet Take 400-600 mg by mouth every 6 (six) hours as needed for headache.    . ipratropium-albuterol (DUONEB) 0.5-2.5 (3) MG/3ML SOLN inhale contents of 1 vial every 4 hours if needed in nebulizer 3 mL 2  . metoprolol succinate (TOPROL-XL) 50 MG 24 hr tablet Take 1 tablet (50 mg total) by mouth daily. Take with or immediately following a meal. 30 tablet 11  . sertraline (ZOLOFT) 25 MG tablet take 1 tablet by mouth once daily 30 tablet 2  . spironolactone (ALDACTONE) 25 MG tablet take 1 tablet by mouth once daily 30 tablet 3  . vitamin B-12 (CYANOCOBALAMIN) 1000 MCG tablet Take 1,000 mcg by mouth daily.    . cyclobenzaprine (FLEXERIL) 10 MG tablet Take 1 tablet (10 mg total) by mouth 3 (three) times daily as needed for muscle spasms. (Patient not taking: Reported on 08/23/2015) 30 tablet 0  . NICODERM CQ 21 MG/24HR patch apply 1 patch topically daily (Patient not taking: Reported on 08/23/2015) 28 patch 0  . oxybutynin (DITROPAN-XL) 10 MG 24 hr tablet take 1 tablet by mouth at bedtime (Patient not taking: Reported on 08/23/2015) 30 tablet 0  .  clonazePAM (KLONOPIN) 0.5 MG tablet take 1 tablet by mouth at bedtime (Patient not taking: Reported on 08/23/2015) 30 tablet 2  . clonazePAM (KLONOPIN) 0.5 MG tablet take 1 tablet by mouth at bedtime (Patient not taking: Reported on 08/23/2015) 30 tablet 2  . etodolac (LODINE) 300 MG capsule Take 1 capsule (300 mg total) by mouth 2 (two) times daily. (Patient not taking: Reported on 08/23/2015) 60 capsule 0   No facility-administered medications prior to visit.    Review of Systems;  Patient denies headache, fevers, malaise, unintentional weight loss, skin rash, eye pain, sinus congestion and sinus pain, sore throat, dysphagia,  hemoptysis , cough, dyspnea, wheezing, chest pain, palpitations, orthopnea, edema, abdominal pain, nausea, melena, diarrhea, constipation, flank pain, dysuria, hematuria, urinary  Frequency, nocturia, numbness, tingling, seizures,  Focal weakness, Loss of consciousness,  Tremor, insomnia, depression, anxiety, and suicidal ideation.      Objective:  BP 138/88 mmHg  Pulse 111  Temp(Src) 98.5 F (36.9 C) (Oral)  Resp 14  Ht 5\' 3"  (1.6 m)  Wt 190 lb 8 oz (86.41 kg)  BMI 33.75 kg/m2  SpO2 99%  LMP 08/22/2015  BP Readings from Last 3 Encounters:  08/23/15 138/88  03/01/15 138/82  01/04/15 124/60    Wt Readings from Last 3 Encounters:  08/23/15 190 lb 8 oz (86.41 kg)  03/01/15 189 lb 6.4 oz (85.911 kg)  01/04/15  187 lb (84.823 kg)    General appearance: alert, cooperative and appears ill.  No respiratory distress Ears: normal TM's and external ear canals both ears Throat:  Tonsils erythematous without edema or ulcerations.  lips, mucosa, and tongue normal; teeth and gums normal Neck: cervical  adenopathy, no carotid bruit, supple, symmetrical, trachea midline and thyroid not enlarged, symmetric, no tenderness/mass/nodules Back: symmetric, no curvature. ROM normal. No CVA tenderness. Lungs: bilateral wheezing, fair air movement, no egophony. No use of accessory  muscles.  Heart: regular rate and rhythm, S1, S2 normal, no murmur, click, rub or gallop Abdomen: soft, non-tender; bowel sounds normal; no masses,  no organomegaly Pulses: 2+ and symmetric Skin: Skin color, texture, turgor normal. No rashes or lesions Lymph nodes: Cervical, supraclavicular, and axillary nodes normal.  Lab Results  Component Value Date   HGBA1C 4.9 09/07/2014    Lab Results  Component Value Date   CREATININE 0.60 11/27/2014   CREATININE 0.86 11/26/2014   CREATININE 0.77 11/26/2014    Lab Results  Component Value Date   WBC 15.7* 11/27/2014   HGB 13.7 11/27/2014   HCT 40.9 11/27/2014   PLT 119* 11/27/2014   GLUCOSE 164* 11/27/2014   CHOL 195 09/07/2014   TRIG 234.0* 09/07/2014   HDL 62.10 09/07/2014   LDLDIRECT 102.0 09/07/2014   LDLCALC 132 10/22/2012   ALT 12 09/07/2014   AST 16 09/07/2014   NA 138 11/27/2014   K 4.1 11/27/2014   CL 108 11/27/2014   CREATININE 0.60 11/27/2014   BUN 8 11/27/2014   CO2 22 11/27/2014   TSH 2.10 09/07/2014   HGBA1C 4.9 09/07/2014    Dg Chest 2 View  11/26/2014  CLINICAL DATA:  Diagnosed with bronchitis 10 days ago has been taking an antibiotic, Pt is not feeling better. C/o shortness of breath, non-productive cough. Smoker for 27 years ago. EXAM: CHEST  2 VIEW COMPARISON:  None. FINDINGS: Normal heart, mediastinum and hila. Clear lungs.  No pleural effusion or pneumothorax. Bony thorax is intact. IMPRESSION: No active cardiopulmonary disease. Electronically Signed   By: Lajean Manes M.D.   On: 11/26/2014 15:13    Assessment & Plan:   Problem List Items Addressed This Visit    COPD exacerbation ([place])    Flu and rapid strep tests are negative.  Exam is c/w COPD exacerbation. Nebulized albuterol and IM Depo medrol given in office with improvement in symptoms.  Prednisone taper, broad spectrum antibiotics, albuterol nebs and cough suppressant. Probiotic advised       Relevant Medications   guaiFENesin-codeine 100-10  MG/5ML syrup   predniSONE (DELTASONE) 10 MG tablet   methylPREDNISolone acetate (DEPO-MEDROL) injection 40 mg (Completed)   albuterol (PROVENTIL) (2.5 MG/3ML) 0.083% nebulizer solution 2.5 mg (Completed)    Other Visit Diagnoses    Cough    -  Primary    Relevant Medications    methylPREDNISolone acetate (DEPO-MEDROL) injection 40 mg (Completed)    albuterol (PROVENTIL) (2.5 MG/3ML) 0.083% nebulizer solution 2.5 mg (Completed)    Other Relevant Orders    POCT Influenza A/B (Completed)    POCT rapid strep A (Completed)    Sorethroat        Relevant Medications    methylPREDNISolone acetate (DEPO-MEDROL) injection 40 mg (Completed)    albuterol (PROVENTIL) (2.5 MG/3ML) 0.083% nebulizer solution 2.5 mg (Completed)    Other Relevant Orders    POCT Influenza A/B (Completed)    POCT rapid strep A (Completed)       I have discontinued  Ms. Lachapelle's etodolac, clonazePAM, and clonazePAM. I am also having her start on levofloxacin and predniSONE. Additionally, I am having her maintain her metoprolol succinate, amLODipine, vitamin B-12, ibuprofen, budesonide-formoterol, cyclobenzaprine, spironolactone, oxybutynin, NICODERM CQ, sertraline, ipratropium-albuterol, and guaiFENesin-codeine. We administered methylPREDNISolone acetate and albuterol.  Meds ordered this encounter  Medications  . guaiFENesin-codeine 100-10 MG/5ML syrup    Sig: Take 5 mLs by mouth 3 (three) times daily as needed for cough.    Dispense:  118 mL    Refill:  0  . levofloxacin (LEVAQUIN) 500 MG tablet    Sig: Take 1 tablet (500 mg total) by mouth daily.    Dispense:  7 tablet    Refill:  0  . predniSONE (DELTASONE) 10 MG tablet    Sig: 6 tablets on Day 1 , then reduce by 1 tablet daily until gone    Dispense:  21 tablet    Refill:  0  . methylPREDNISolone acetate (DEPO-MEDROL) injection 40 mg    Sig:   . albuterol (PROVENTIL) (2.5 MG/3ML) 0.083% nebulizer solution 2.5 mg    Sig:     Medications Discontinued During  This Encounter  Medication Reason  . guaiFENesin-codeine 100-10 MG/5ML syrup Reorder  . clonazePAM (KLONOPIN) 0.5 MG tablet   . clonazePAM (KLONOPIN) 0.5 MG tablet   . etodolac (LODINE) 300 MG capsule     Follow-up: No Follow-up on file.   Crecencio Mc, MD

## 2015-09-12 ENCOUNTER — Encounter: Payer: Self-pay | Admitting: Internal Medicine

## 2015-09-12 ENCOUNTER — Encounter: Payer: Self-pay | Admitting: Nurse Practitioner

## 2015-09-13 ENCOUNTER — Encounter: Payer: Self-pay | Admitting: Internal Medicine

## 2015-09-13 ENCOUNTER — Ambulatory Visit (INDEPENDENT_AMBULATORY_CARE_PROVIDER_SITE_OTHER): Payer: BLUE CROSS/BLUE SHIELD | Admitting: Internal Medicine

## 2015-09-13 VITALS — BP 148/80 | HR 105 | Temp 98.3°F | Resp 14 | Ht 64.0 in | Wt 186.5 lb

## 2015-09-13 DIAGNOSIS — J040 Acute laryngitis: Secondary | ICD-10-CM | POA: Diagnosis not present

## 2015-09-13 DIAGNOSIS — J441 Chronic obstructive pulmonary disease with (acute) exacerbation: Secondary | ICD-10-CM | POA: Diagnosis not present

## 2015-09-13 MED ORDER — PREDNISONE 10 MG PO TABS: ORAL_TABLET | ORAL | Status: DC

## 2015-09-13 NOTE — Progress Notes (Signed)
Pre-visit discussion using our clinic review tool. No additional management support is needed unless otherwise documented below in the visit note.  

## 2015-09-13 NOTE — Progress Notes (Signed)
Subjective:  Patient ID: Carly [name], female    DOB: 03/05/1971  Age: 45 y.o. MRN: [id number]  CC: The primary encounter diagnosis was Laryngitis. A diagnosis of COPD exacerbation ([place]) was also pertinent to this visit.  HPI Carly [name] presents for follow up on  persistent sore throat and cough.  Patient was treated 2 weeks ago for COPD exacerbation with Levaquin 500 mg daily x 7 days,  prednisone taper and cough suppressant.  She has continued to smoke.  She developed laryngitis .  Denies fevers, pleurisy, and wheezing.  Still coughing,  Sputum is clear/yellow.   Outpatient Prescriptions Prior to Visit  Medication Sig Dispense Refill  . amLODipine (NORVASC) 5 MG tablet take 1 tablet by mouth once daily 30 tablet 5  . budesonide-formoterol (SYMBICORT) 160-4.5 MCG/ACT inhaler Inhale 2 puffs into the lungs 2 (two) times daily. 1 Inhaler 6  . ibuprofen (ADVIL,MOTRIN) 200 MG tablet Take 400-600 mg by mouth every 6 (six) hours as needed for headache.    . ipratropium-albuterol (DUONEB) 0.5-2.5 (3) MG/3ML SOLN inhale contents of 1 vial every 4 hours if needed in nebulizer 3 mL 2  . metoprolol succinate (TOPROL-XL) 50 MG 24 hr tablet Take 1 tablet (50 mg total) by mouth daily. Take with or immediately following a meal. 30 tablet 11  . oxybutynin (DITROPAN-XL) 10 MG 24 hr tablet take 1 tablet by mouth at bedtime 30 tablet 0  . sertraline (ZOLOFT) 25 MG tablet take 1 tablet by mouth once daily 30 tablet 2  . spironolactone (ALDACTONE) 25 MG tablet take 1 tablet by mouth once daily 30 tablet 3  . vitamin B-12 (CYANOCOBALAMIN) 1000 MCG tablet Take 1,000 mcg by mouth daily.    . cyclobenzaprine (FLEXERIL) 10 MG tablet Take 1 tablet (10 mg total) by mouth 3 (three) times daily as needed for muscle spasms. (Patient not taking: Reported on 08/23/2015) 30 tablet 0  . guaiFENesin-codeine 100-10 MG/5ML syrup Take 5 mLs by mouth 3 (three) times daily as needed for cough. (Patient not taking: Reported on  09/13/2015) 118 mL 0  . levofloxacin (LEVAQUIN) 500 MG tablet Take 1 tablet (500 mg total) by mouth daily. (Patient not taking: Reported on 09/13/2015) 7 tablet 0  . NICODERM CQ 21 MG/24HR patch apply 1 patch topically daily (Patient not taking: Reported on 08/23/2015) 28 patch 0  . predniSONE (DELTASONE) 10 MG tablet 6 tablets on Day 1 , then reduce by 1 tablet daily until gone (Patient not taking: Reported on 09/13/2015) 21 tablet 0   No facility-administered medications prior to visit.    Review of Systems;  Patient denies headache, fevers, malaise, unintentional weight loss, skin rash, eye pain, sinus congestion and sinus pain, sore throat, dysphagia,  hemoptysis , cough, dyspnea, wheezing, chest pain, palpitations, orthopnea, edema, abdominal pain, nausea, melena, diarrhea, constipation, flank pain, dysuria, hematuria, urinary  Frequency, nocturia, numbness, tingling, seizures,  Focal weakness, Loss of consciousness,  Tremor, insomnia, depression, anxiety, and suicidal ideation.      Objective:  BP 148/80 mmHg  Pulse 105  Temp(Src) 98.3 F (36.8 C) (Oral)  Resp 14  Ht 5\' 4"  (1.626 m)  Wt 186 lb 8 oz (84.596 kg)  BMI 32.00 kg/m2  SpO2 98%  LMP 08/22/2015  BP Readings from Last 3 Encounters:  09/13/15 148/80  08/23/15 138/88  03/01/15 138/82    Wt Readings from Last 3 Encounters:  09/13/15 186 lb 8 oz (84.596 kg)  08/23/15 190 lb 8 oz (86.41 kg)  03/01/15 189 lb 6.4 oz (85.911 kg)    General appearance: alert, cooperative and appears stated age Ears: normal TM's and external ear canals both ears Throat: lips, mucosa, and tongue normal; teeth and gums normal Neck: no adenopathy, no carotid bruit, supple, symmetrical, trachea midline and thyroid not enlarged, symmetric, no tenderness/mass/nodules Back: symmetric, no curvature. ROM normal. No CVA tenderness. Lungs: clear to auscultation bilaterally Heart: regular rate and rhythm, S1, S2 normal, no murmur, click, rub or  gallop Abdomen: soft, non-tender; bowel sounds normal; no masses,  no organomegaly Pulses: 2+ and symmetric Skin: Skin color, texture, turgor normal. No rashes or lesions Lymph nodes: Cervical, supraclavicular, and axillary nodes normal.  Lab Results  Component Value Date   HGBA1C 4.9 09/07/2014    Lab Results  Component Value Date   CREATININE 0.60 11/27/2014   CREATININE 0.86 11/26/2014   CREATININE 0.77 11/26/2014    Lab Results  Component Value Date   WBC 15.7* 11/27/2014   HGB 13.7 11/27/2014   HCT 40.9 11/27/2014   PLT 119* 11/27/2014   GLUCOSE 164* 11/27/2014   CHOL 195 09/07/2014   TRIG 234.0* 09/07/2014   HDL 62.10 09/07/2014   LDLDIRECT 102.0 09/07/2014   LDLCALC 132 10/22/2012   ALT 12 09/07/2014   AST 16 09/07/2014   NA 138 11/27/2014   K 4.1 11/27/2014   CL 108 11/27/2014   CREATININE 0.60 11/27/2014   BUN 8 11/27/2014   CO2 22 11/27/2014   TSH 2.10 09/07/2014   HGBA1C 4.9 09/07/2014    Dg Chest 2 View  11/26/2014  CLINICAL DATA:  Diagnosed with bronchitis 10 days ago has been taking an antibiotic, Pt is not feeling better. C/o shortness of breath, non-productive cough. Smoker for 27 years ago. EXAM: CHEST  2 VIEW COMPARISON:  None. FINDINGS: Normal heart, mediastinum and hila. Clear lungs.  No pleural effusion or pneumothorax. Bony thorax is intact. IMPRESSION: No active cardiopulmonary disease. Electronically Signed   By: Lajean Manes M.D.   On: 11/26/2014 15:13    Assessment & Plan:   Problem List Items Addressed This Visit    COPD exacerbation ([place])    Exam has improved,  She is not hypoxic,  Supportive care outlined,  Prednisone taper repeated,  Admonished to quit smoking (at least during illness if not forever)  And rest voice.       Relevant Medications   predniSONE (DELTASONE) 10 MG tablet   Laryngitis - Primary      I am having [name] start on predniSONE. I am also having her maintain her metoprolol succinate, amLODipine, vitamin  B-12, ibuprofen, budesonide-formoterol, cyclobenzaprine, spironolactone, oxybutynin, NICODERM CQ, sertraline, ipratropium-albuterol, guaiFENesin-codeine, levofloxacin, and predniSONE.  Meds ordered this encounter  Medications  . predniSONE (DELTASONE) 10 MG tablet    Sig: 6 tablets on Day 1 , then reduce by 1 tablet daily until gone    Dispense:  21 tablet    Refill:  0    There are no discontinued medications.  Follow-up: No Follow-up on file.   Crecencio Mc, MD

## 2015-09-13 NOTE — Patient Instructions (Addendum)
You have laryngitis.  This is not contagious,  It is due to swelling of the vocal cords becase of cough, sinus drainage and smoking  Repeat prednisone taper  Take Benadryl at night for the sinus drainage Take Zyrtec,  claritin or allegra during the day    Gargle with salt water often. REST YOUR VOICE!!!  NO SMOKING!!!!!  Laryngitis Laryngitis is inflammation of your vocal cords. This causes hoarseness, coughing, loss of voice, sore throat, or a dry throat. Your vocal cords are two bands of muscles that are found in your throat. When you speak, these cords come together and vibrate. These vibrations come out through your mouth as sound. When your vocal cords are inflamed, your voice sounds different. Laryngitis can be temporary (acute) or long-term (chronic). Most cases of acute laryngitis improve with time. Chronic laryngitis is laryngitis that lasts for more than three weeks. CAUSES Acute laryngitis may be caused by:  A viral infection.  Lots of talking, yelling, or singing. This is also called vocal strain.  Bacterial infections. Chronic laryngitis may be caused by:  Vocal strain.  Injury to your vocal cords.  Acid reflux (gastroesophageal reflux disease or GERD).  Allergies.  Sinus infection.  Smoking.  Alcohol abuse.  Breathing in chemicals or dust.  Growths on the vocal cords. RISK FACTORS Risk factors for laryngitis include:  Smoking.  Alcohol abuse.  Having allergies. SIGNS AND SYMPTOMS Symptoms of laryngitis may include:  Low, hoarse voice.  Loss of voice.  Dry cough.  Sore throat.  Stuffy nose. DIAGNOSIS Laryngitis may be diagnosed by:  Physical exam.  Throat culture.  Blood test.  Laryngoscopy. This procedure allows your health care provider to look at your vocal cords with a mirror or viewing tube. TREATMENT Treatment for laryngitis depends on what is causing it. Usually, treatment involves resting your voice and using medicines to  soothe your throat. However, if your laryngitis is caused by a bacterial infection, you may need to take antibiotic medicine. If your laryngitis is caused by a growth, you may need to have a procedure to remove it. HOME CARE INSTRUCTIONS  Drink enough fluid to keep your urine clear or pale yellow.  Breathe in moist air. Use a humidifier if you live in a dry climate.  Take medicines only as directed by your health care provider.  If you were prescribed an antibiotic medicine, finish it all even if you start to feel better.  Do not smoke cigarettes or electronic cigarettes. If you need help quitting, ask your health care provider.  Talk as little as possible. Also avoid whispering, which can cause vocal strain.  Write instead of talking. Do this until your voice is back to normal. SEEK MEDICAL CARE IF:  You have a fever.  You have increasing pain.  You have difficulty swallowing. SEEK IMMEDIATE MEDICAL CARE IF:  You cough up blood.  You have trouble breathing.   This information is not intended to replace advice given to you by your health care provider. Make sure you discuss any questions you have with your health care provider.   Document Released: 07/02/2005 Document Revised: 07/23/2014 Document Reviewed: 12/15/2013 Elsevier Interactive Patient Education [employer].

## 2015-09-15 NOTE — Assessment & Plan Note (Signed)
Exam has improved,  She is not hypoxic,  Supportive care outlined,  Prednisone taper repeated,  Admonished to quit smoking (at least during illness if not forever)  And rest voice.

## 2015-09-27 ENCOUNTER — Ambulatory Visit (INDEPENDENT_AMBULATORY_CARE_PROVIDER_SITE_OTHER): Payer: BLUE CROSS/BLUE SHIELD

## 2015-09-27 VITALS — BP 128/78 | HR 94 | Resp 18

## 2015-09-27 DIAGNOSIS — I10 Essential (primary) hypertension: Secondary | ICD-10-CM | POA: Diagnosis not present

## 2015-09-27 NOTE — Progress Notes (Signed)
Patient came in for BP check.  Checked Bilateral upper extremities.  See vitals for details.    Patient states no changes to her medications since last visit.  Last OV was 2/28 and BP was 148/80, C.Doss advised for recheck in 2 weeks.   Please advise in C.Doss's absence.

## 2015-09-27 NOTE — Progress Notes (Signed)
Patient ID: Carly [name], female   DOB: 10-02-1970, 45 y.o.   MRN: [id number]   Reviewed- will follow and adjust meds   Lorane Gell, NP-C  09/27/2015 1801

## 2015-09-27 NOTE — Progress Notes (Signed)
BP in prehypertensive range. Continue close follow up with [facility].

## 2015-10-05 ENCOUNTER — Encounter: Payer: Self-pay | Admitting: Nurse Practitioner

## 2015-10-12 ENCOUNTER — Telehealth: Payer: BLUE CROSS/BLUE SHIELD | Admitting: Family

## 2015-10-12 DIAGNOSIS — M546 Pain in thoracic spine: Secondary | ICD-10-CM

## 2015-10-12 DIAGNOSIS — M549 Dorsalgia, unspecified: Secondary | ICD-10-CM

## 2015-10-12 MED ORDER — CYCLOBENZAPRINE HCL 10 MG PO TABS
10.0000 mg | ORAL_TABLET | Freq: Three times a day (TID) | ORAL | Status: DC | PRN
Start: 2015-10-12 — End: 2016-01-20

## 2015-10-12 MED ORDER — NAPROXEN 500 MG PO TABS: 500.0000 mg | ORAL_TABLET | Freq: Two times a day (BID) | ORAL | Status: DC

## 2015-10-12 NOTE — Progress Notes (Signed)

## 2015-10-22 ENCOUNTER — Other Ambulatory Visit: Payer: Self-pay | Admitting: Nurse Practitioner

## 2015-10-24 ENCOUNTER — Encounter: Payer: Self-pay | Admitting: Nurse Practitioner

## 2015-10-31 ENCOUNTER — Encounter: Payer: Self-pay | Admitting: Family Medicine

## 2015-10-31 ENCOUNTER — Ambulatory Visit (INDEPENDENT_AMBULATORY_CARE_PROVIDER_SITE_OTHER): Payer: BLUE CROSS/BLUE SHIELD | Admitting: Family Medicine

## 2015-10-31 VITALS — BP 158/86 | HR 102 | Temp 98.2°F | Ht 64.0 in | Wt 186.5 lb

## 2015-10-31 DIAGNOSIS — M791 Myalgia, unspecified site: Secondary | ICD-10-CM | POA: Insufficient documentation

## 2015-10-31 DIAGNOSIS — R5383 Other fatigue: Secondary | ICD-10-CM | POA: Diagnosis not present

## 2015-10-31 LAB — CBC WITH DIFFERENTIAL/PLATELET
Basophils Absolute: 0 10*3/uL (ref 0.0–0.1)
Basophils Relative: 0.3 % (ref 0.0–3.0)
Eosinophils Absolute: 0.1 10*3/uL (ref 0.0–0.7)
Eosinophils Relative: 1.1 % (ref 0.0–5.0)
HCT: 46.4 % — ABNORMAL HIGH (ref 36.0–46.0)
Hemoglobin: 15.6 g/dL — ABNORMAL HIGH (ref 12.0–15.0)
Lymphocytes Relative: 18.3 % (ref 12.0–46.0)
Lymphs Abs: 2.4 10*3/uL (ref 0.7–4.0)
MCHC: 33.6 g/dL (ref 30.0–36.0)
MCV: 92.9 fl (ref 78.0–100.0)
Monocytes Absolute: 0.6 10*3/uL (ref 0.1–1.0)
Monocytes Relative: 4.3 % (ref 3.0–12.0)
Neutro Abs: 9.9 10*3/uL — ABNORMAL HIGH (ref 1.4–7.7)
Neutrophils Relative %: 76 % (ref 43.0–77.0)
Platelets: 66 10*3/uL — ABNORMAL LOW (ref 150.0–400.0)
RBC: 4.99 Mil/uL (ref 3.87–5.11)
RDW: 12.9 % (ref 11.5–15.5)
WBC: 13 10*3/uL — ABNORMAL HIGH (ref 4.0–10.5)

## 2015-10-31 LAB — C-REACTIVE PROTEIN: CRP: 0.1 mg/dL — ABNORMAL LOW (ref 0.5–20.0)

## 2015-10-31 LAB — COMPREHENSIVE METABOLIC PANEL
ALT: 15 U/L (ref 0–35)
AST: 16 U/L (ref 0–37)
Albumin: 4 g/dL (ref 3.5–5.2)
Alkaline Phosphatase: 63 U/L (ref 39–117)
BUN: 14 mg/dL (ref 6–23)
CO2: 22 mEq/L (ref 19–32)
Calcium: 9.1 mg/dL (ref 8.4–10.5)
Chloride: 103 mEq/L (ref 96–112)
Creatinine, Ser: 0.81 mg/dL (ref 0.40–1.20)
GFR: 81.54 mL/min (ref >60.00)
Glucose, Bld: 114 mg/dL — ABNORMAL HIGH (ref 70–99)
Potassium: 4.4 mEq/L (ref 3.5–5.1)
Sodium: 137 mEq/L (ref 135–145)
Total Bilirubin: 0.6 mg/dL (ref 0.2–1.2)
Total Protein: 7.1 g/dL (ref 6.0–8.3)

## 2015-10-31 LAB — TSH: TSH: 2.29 u[IU]/mL (ref 0.35–4.50)

## 2015-10-31 LAB — CK: Total CK: 103 U/L (ref 7–177)

## 2015-10-31 LAB — SEDIMENTATION RATE: Sed Rate: 1 mm/hr (ref 0–22)

## 2015-10-31 LAB — VITAMIN D 25 HYDROXY (VIT D DEFICIENCY, FRACTURES): VITD: 14.45 ng/mL — ABNORMAL LOW (ref 30.00–100.00)

## 2015-10-31 NOTE — Patient Instructions (Signed)
Your exam is unremarkable.  I'm not sure of the cause of your symptoms.  We will call with your lab results.  Try and cut back on the ibuprofen as this may cause harm down the road.  Follow up closely with [facility]  Take care  Dr. Lacinda Axon

## 2015-10-31 NOTE — Assessment & Plan Note (Signed)
New problem, uncertain diagnosis/prognosis. DDx: Polymyositis, dermatomyositis, vitamin D deficiency, thyroid disorder, metabolic disorder, other rheumatologic disease. Obtaining workup today: CBC, CMP, TSH, ESR, ERP, CK, vitamin D.  Patient will need close follow-up with PCP.

## 2015-10-31 NOTE — Progress Notes (Signed)
Pre visit review using our clinic review tool, if applicable. No additional management support is needed unless otherwise documented below in the visit note. 

## 2015-10-31 NOTE — Assessment & Plan Note (Signed)
New problem. Unclear etiology. Starting with lab work (see orders).

## 2015-10-31 NOTE — Progress Notes (Signed)
Subjective:  Patient ID: Carly [name], female    DOB: April 13, 1971  Age: 45 y.o. MRN: [id number]  CC: Fatigue, muscle pain  HPI:  45 year old female with a past medical history of tobacco abuse, obesity, hypertension, COPD presents with the above complaints.  Patient reports a 3 week history of muscle aches and fatigue. She states that the myalgias are of her lower extremities, particularly the thighs and calves. She states that she feels as if she "doesn't want to move". She states she's "tired". No known inciting factor. No known relieving factors. Seems to be exacerbated by activity. She states that she's been taking ibuprofen for the pain, 10-12 pills a day. She states she been doing this for more than a year. No associated fevers or chills. She does report night sweats. No weight loss. No other associated symptoms. No new medication changes.  Social Hx   Social History   Social History  . Marital Status: Married    Spouse Name: N/A  . Number of Children: N/A  . Years of Education: N/A   Social History Main Topics  . Smoking status: Current Every Day Smoker -- 0.25 packs/day    Types: Cigarettes  . Smokeless tobacco: Never Used     Comment: Using Patch currently   . Alcohol Use: 4.8 oz/week    8 Standard drinks or equivalent per week     Comment: Beer (Can)   . Drug Use: No  . Sexual Activity:    Partners: Male   Other Topics Concern  . None   Social History Narrative   1 Year college    Works in a [employer]- [employer]   Married to Mariano Colan    Enjoys coloring    Pets: Dog lives inside   Children: 45 Son 23 Son      Review of Systems  Constitutional: Positive for fatigue. Negative for fever and unexpected weight change.  Musculoskeletal: Positive for myalgias.   Objective:  BP 158/86 mmHg  Pulse 102  Temp(Src) 98.2 F (36.8 C) (Oral)  Ht 5' 4"  (1.626 m)  Wt 186 lb 8 oz (84.596 kg)  BMI 32.00 kg/m2  SpO2 98%  BP/Weight 10/31/2015 09/27/2015  8/54/6270  Systolic BP 350 093 818  Diastolic BP 86 78 80  Wt. (Lbs) 186.5 - 186.5  BMI 32 - 32   Physical Exam  Constitutional: She is oriented to person, place, and time. She appears well-developed. No distress.  Neck: Neck supple. No thyromegaly present.  Cardiovascular: Regular rhythm.  Tachycardia present.   Pulmonary/Chest: Effort normal and breath sounds normal.  Musculoskeletal:  Lower extremities - left proximal thigh tender to palpation. No other areas of discrete tenderness.  Neurological: She is alert and oriented to person, place, and time.  Psychiatric: She has a normal mood and affect.  Vitals reviewed.  Lab Results  Component Value Date   WBC 15.7* 11/27/2014   HGB 13.7 11/27/2014   HCT 40.9 11/27/2014   PLT 119* 11/27/2014   GLUCOSE 164* 11/27/2014   CHOL 195 09/07/2014   TRIG 234.0* 09/07/2014   HDL 62.10 09/07/2014   LDLDIRECT 102.0 09/07/2014   LDLCALC 132 10/22/2012   ALT 12 09/07/2014   AST 16 09/07/2014   NA 138 11/27/2014   K 4.1 11/27/2014   CL 108 11/27/2014   CREATININE 0.60 11/27/2014   BUN 8 11/27/2014   CO2 22 11/27/2014   TSH 2.10 09/07/2014   HGBA1C 4.9 09/07/2014    Assessment & Plan:  Problem List Items Addressed This Visit    Myalgia - Primary    New problem, uncertain diagnosis/prognosis. DDx: Polymyositis, dermatomyositis, vitamin D deficiency, thyroid disorder, metabolic disorder, other rheumatologic disease. Obtaining workup today: CBC, CMP, TSH, ESR, ERP, CK, vitamin D.  Patient will need close follow-up with PCP.      Relevant Orders   CBC w/Diff   Comprehensive metabolic panel   TSH   Sedimentation rate   C-reactive protein   CK (Creatine Kinase)   Vitamin D (25 hydroxy)   Other fatigue    New problem. Unclear etiology. Starting with lab work (see orders).      Relevant Orders   CBC w/Diff   Comprehensive metabolic panel   TSH   Sedimentation rate   C-reactive protein   CK (Creatine Kinase)      Follow-up: Needs close follow up with PCP.  [place]

## 2015-11-01 ENCOUNTER — Telehealth: Payer: Self-pay | Admitting: Nurse Practitioner

## 2015-11-01 ENCOUNTER — Encounter: Payer: Self-pay | Admitting: Nurse Practitioner

## 2015-11-01 NOTE — Telephone Encounter (Signed)
Pt is returning phone call from office. She would like her lab results.

## 2015-11-01 NOTE — Telephone Encounter (Signed)
Spoke with the patient and reviewed her labs with her.

## 2015-11-03 ENCOUNTER — Other Ambulatory Visit: Payer: Self-pay | Admitting: Nurse Practitioner

## 2015-11-03 DIAGNOSIS — D696 Thrombocytopenia, unspecified: Secondary | ICD-10-CM

## 2015-11-07 ENCOUNTER — Encounter: Payer: Self-pay | Admitting: Nurse Practitioner

## 2015-11-09 ENCOUNTER — Inpatient Hospital Stay: Payer: BLUE CROSS/BLUE SHIELD

## 2015-11-09 ENCOUNTER — Inpatient Hospital Stay: Payer: BLUE CROSS/BLUE SHIELD | Attending: Oncology | Admitting: Oncology

## 2015-11-09 VITALS — BP 152/90 | HR 105 | Temp 97.1°F | Resp 20 | Wt 191.1 lb

## 2015-11-09 DIAGNOSIS — R531 Weakness: Secondary | ICD-10-CM | POA: Insufficient documentation

## 2015-11-09 DIAGNOSIS — F17210 Nicotine dependence, cigarettes, uncomplicated: Secondary | ICD-10-CM | POA: Insufficient documentation

## 2015-11-09 DIAGNOSIS — R5383 Other fatigue: Secondary | ICD-10-CM | POA: Diagnosis not present

## 2015-11-09 DIAGNOSIS — D696 Thrombocytopenia, unspecified: Secondary | ICD-10-CM | POA: Diagnosis not present

## 2015-11-09 DIAGNOSIS — Z79899 Other long term (current) drug therapy: Secondary | ICD-10-CM | POA: Diagnosis not present

## 2015-11-09 DIAGNOSIS — R5381 Other malaise: Secondary | ICD-10-CM | POA: Insufficient documentation

## 2015-11-09 DIAGNOSIS — I10 Essential (primary) hypertension: Secondary | ICD-10-CM | POA: Diagnosis not present

## 2015-11-09 DIAGNOSIS — J439 Emphysema, unspecified: Secondary | ICD-10-CM | POA: Diagnosis not present

## 2015-11-09 LAB — CBC
HCT: 43.8 % (ref 35.0–47.0)
Hemoglobin: 14.9 g/dL (ref 12.0–16.0)
MCH: 31.4 pg (ref 26.0–34.0)
MCHC: 34 g/dL (ref 32.0–36.0)
MCV: 92.4 fL (ref 80.0–100.0)
Platelets: 63 10*3/uL — ABNORMAL LOW (ref 150–440)
RBC: 4.74 MIL/uL (ref 3.80–5.20)
RDW: 12.9 % (ref 11.5–14.5)
WBC: 8.7 10*3/uL (ref 3.6–11.0)

## 2015-11-09 LAB — LACTATE DEHYDROGENASE: LDH: 188 U/L (ref 98–192)

## 2015-11-09 LAB — IRON AND TIBC
Iron: 81 ug/dL (ref 28–170)
Saturation Ratios: 16 % (ref 10.4–31.8)
TIBC: 511 ug/dL — ABNORMAL HIGH (ref 250–450)
UIBC: 430 ug/dL

## 2015-11-09 LAB — FERRITIN: Ferritin: 24 ng/mL (ref 11–307)

## 2015-11-09 LAB — FOLATE: Folate: 12.5 ng/mL (ref >5.9)

## 2015-11-09 NOTE — Progress Notes (Signed)
Patient here today for initial evaluation regarding thrombocytopenia. Patient denies any concerns today.

## 2015-11-10 LAB — VITAMIN B12: Vitamin B-12: 346 pg/mL (ref 180–914)

## 2015-11-10 LAB — PLATELET ANTIBODY PROFILE, SERUM
HLA Ab Ser Ql EIA: NEGATIVE
IA/IIA Antibody: NEGATIVE
IB/IX Antibody: NEGATIVE
IIB/IIIA Antibody: NEGATIVE

## 2015-11-10 LAB — HAPTOGLOBIN: Haptoglobin: 235 mg/dL — ABNORMAL HIGH (ref 34–200)

## 2015-11-10 LAB — ANA W/REFLEX: Anti Nuclear Antibody(ANA): NEGATIVE

## 2015-11-11 ENCOUNTER — Encounter: Payer: Self-pay | Admitting: Oncology

## 2015-11-15 ENCOUNTER — Encounter: Payer: Self-pay | Admitting: Oncology

## 2015-11-15 DIAGNOSIS — R109 Unspecified abdominal pain: Secondary | ICD-10-CM

## 2015-11-15 DIAGNOSIS — D696 Thrombocytopenia, unspecified: Secondary | ICD-10-CM

## 2015-11-16 NOTE — Progress Notes (Signed)
[place]  [phone] [phone]) [phone]  ID: Carly [name] OB: 05-Oct-1970  MR#: [id number]  [id number]  Patient Care Team: Rubbie Battiest, NP as PCP - General (Nurse Practitioner)  CHIEF COMPLAINT:  Chief Complaint  Patient presents with  . New Evaluation    Thrombocytopenia    INTERVAL HISTORY: Patient is a 45 year old female who was found to have a new onset thrombocytopenia in the setting of a possible diagnosis of polymyositis. She is currently being worked up for rheumatologic disorder.  Currently, she complains of weakness and fatigue but otherwise feels well. She denies any easy bleeding or bruising.  She has no neurologic complaints. She denies any recent fevers. She has a good appetite and denies weight loss. She denies any chest pain or shortness of breath. She denies any nausea, vomiting, constipation, or diarrhea. She has no urinary complaints. Patient otherwise feels well and offers no further specific complaints.  REVIEW OF SYSTEMS:   Review of Systems  Constitutional: Positive for malaise/fatigue. Negative for fever and weight loss.  Respiratory: Negative.  Negative for shortness of breath.   Cardiovascular: Negative.  Negative for chest pain.  Gastrointestinal: Negative.  Negative for abdominal pain, blood in stool and melena.  Genitourinary: Negative.   Musculoskeletal: Negative.   Neurological: Positive for weakness.  Endo/Heme/Allergies: Does not bruise/bleed easily.  Psychiatric/Behavioral: Negative.     As per HPI. Otherwise, a complete review of systems is negatve.  PAST MEDICAL HISTORY: Past Medical History  Diagnosis Date  . Chicken pox   . Emphysema of lung ([place])   . Frequent headaches   . Hay fever   . Hypertension   . Anxiety   . Elevated testosterone level in female     PAST SURGICAL HISTORY: Past Surgical History  Procedure Laterality Date  . Tubal ligation  1997  . Tubal ligation      FAMILY  HISTORY Family History  Problem Relation Age of Onset  . Hyperlipidemia Mother   . Heart disease Mother   . Stroke Mother   . Depression Mother   . Mental illness Mother   . Hyperlipidemia Father   . Depression Father   . Mental illness Father   . Diabetes Paternal Grandfather   . Cancer Cousin        ADVANCED DIRECTIVES:    HEALTH MAINTENANCE: Social History  Substance Use Topics  . Smoking status: Current Every Day Smoker -- 0.25 packs/day    Types: Cigarettes  . Smokeless tobacco: Never Used     Comment: Using Patch currently   . Alcohol Use: 4.8 oz/week    8 Standard drinks or equivalent per week     Comment: Beer (Can)      Colonoscopy:  PAP:  Bone density:  Lipid panel:  No Known Allergies  Current Outpatient Prescriptions  Medication Sig Dispense Refill  . amLODipine (NORVASC) 5 MG tablet take 1 tablet by mouth once daily 30 tablet 5  . budesonide-formoterol (SYMBICORT) 160-4.5 MCG/ACT inhaler Inhale 2 puffs into the lungs 2 (two) times daily. 1 Inhaler 6  . cyclobenzaprine (FLEXERIL) 10 MG tablet Take 1 tablet (10 mg total) by mouth 3 (three) times daily as needed for muscle spasms. 30 tablet 0  . ibuprofen (ADVIL,MOTRIN) 200 MG tablet Take 400-600 mg by mouth every 6 (six) hours as needed for headache.    . ipratropium-albuterol (DUONEB) 0.5-2.5 (3) MG/3ML SOLN inhale contents of 1 vial every 4 hours if needed in nebulizer 3 mL 2  .  metoprolol succinate (TOPROL-XL) 50 MG 24 hr tablet take 1 tablet by mouth once daily TAKE WITH OR IMMEDIATELY FOLLOWING A MEAL 90 tablet 3  . naproxen (NAPROSYN) 500 MG tablet Take 1 tablet (500 mg total) by mouth 2 (two) times daily with a meal. 30 tablet 0  . sertraline (ZOLOFT) 25 MG tablet take 1 tablet by mouth once daily 30 tablet 2  . spironolactone (ALDACTONE) 25 MG tablet take 1 tablet by mouth once daily 30 tablet 3  . vitamin B-12 (CYANOCOBALAMIN) 1000 MCG tablet Take 1,000 mcg by mouth daily.     No current  facility-administered medications for this visit.    OBJECTIVE: Filed Vitals:   11/09/15 1507  BP: 152/90  Pulse: 105  Temp: 97.1 F (36.2 C)  Resp: 20     Body mass index is 32.79 kg/(m^2).    ECOG FS:0 - Asymptomatic  General: Well-developed, well-nourished, no acute distress. Eyes: Pink conjunctiva, anicteric sclera. HEENT: Normocephalic, moist mucous membranes, clear oropharnyx. Lungs: Clear to auscultation bilaterally. Heart: Regular rate and rhythm. No rubs, murmurs, or gallops. Abdomen: Soft, nontender, nondistended. No organomegaly noted, normoactive bowel sounds. Musculoskeletal: No edema, cyanosis, or clubbing. Neuro: Alert, answering all questions appropriately. Cranial nerves grossly intact. Skin: No rashes or petechiae noted. Psych: Normal affect. Lymphatics: No cervical, calvicular, axillary or inguinal LAD.   LAB RESULTS:  Lab Results  Component Value Date   NA 137 10/31/2015   K 4.4 10/31/2015   CL 103 10/31/2015   CO2 22 10/31/2015   GLUCOSE 114* 10/31/2015   BUN 14 10/31/2015   CREATININE 0.81 10/31/2015   CALCIUM 9.1 10/31/2015   PROT 7.1 10/31/2015   ALBUMIN 4.0 10/31/2015   AST 16 10/31/2015   ALT 15 10/31/2015   ALKPHOS 63 10/31/2015   BILITOT 0.6 10/31/2015   GFRNONAA >60 11/27/2014   GFRAA >60 11/27/2014    Lab Results  Component Value Date   WBC 8.7 11/09/2015   NEUTROABS 9.9* 10/31/2015   HGB 14.9 11/09/2015   HCT 43.8 11/09/2015   MCV 92.4 11/09/2015   PLT 63* 11/09/2015    STUDIES: No results found.  ASSESSMENT: Thrombocytopenia.  PLAN:    1. Thrombocytopenia: Unclear etiology. All her laboratory work including iron stores, hemolysis labs, antiplatelet antibodies, and ANA are either negative or within normal limits. This is possibly related to her underlying diagnosis of polymyositis and/or rheumatologic disorder. Patient had an abdominal ultrasound in 2011 did not reveal splenomegaly. Will repeat this in the next 1-2 weeks.  Return to clinic in 1 month for further evaluation and laboratory work. 2. Hypertension: Blood pressure Mildly elevated today, continue current medications.  Patient expressed understanding and was in agreement with this plan. She also understands that She can call clinic at any time with any questions, concerns, or complaints.    Lloyd Huger, MD   11/16/2015 9:13 AM

## 2015-11-25 ENCOUNTER — Ambulatory Visit
Admission: RE | Admit: 2015-11-25 | Discharge: 2015-11-25 | Disposition: A | Discharge status: Home / Self Care | Payer: BLUE CROSS/BLUE SHIELD | Source: Ambulatory Visit | Attending: Oncology | Admitting: Oncology

## 2015-11-25 ENCOUNTER — Ambulatory Visit: Payer: BLUE CROSS/BLUE SHIELD

## 2015-11-25 DIAGNOSIS — R932 Abnormal findings on diagnostic imaging of liver and biliary tract: Secondary | ICD-10-CM | POA: Insufficient documentation

## 2015-11-25 DIAGNOSIS — D696 Thrombocytopenia, unspecified: Secondary | ICD-10-CM | POA: Diagnosis not present

## 2015-11-25 DIAGNOSIS — D1771 Benign lipomatous neoplasm of kidney: Secondary | ICD-10-CM | POA: Insufficient documentation

## 2015-11-25 DIAGNOSIS — R109 Unspecified abdominal pain: Secondary | ICD-10-CM | POA: Diagnosis not present

## 2015-11-29 ENCOUNTER — Encounter: Payer: Self-pay | Admitting: Oncology

## 2015-12-01 ENCOUNTER — Encounter: Payer: Self-pay | Admitting: Oncology

## 2015-12-02 ENCOUNTER — Telehealth: Payer: Self-pay | Admitting: *Deleted

## 2015-12-02 NOTE — Telephone Encounter (Signed)
Per Dr. Grayland Ormond results of ultrasound are not concerning, will forward copy to patients PCP. Left vm message for patient regarding results.

## 2015-12-07 ENCOUNTER — Ambulatory Visit: Payer: BLUE CROSS/BLUE SHIELD

## 2015-12-14 ENCOUNTER — Inpatient Hospital Stay: Payer: BLUE CROSS/BLUE SHIELD

## 2015-12-14 ENCOUNTER — Inpatient Hospital Stay: Payer: BLUE CROSS/BLUE SHIELD | Attending: Oncology | Admitting: Oncology

## 2015-12-14 VITALS — BP 145/89 | HR 98 | Temp 98.4°F | Resp 16 | Wt 192.9 lb

## 2015-12-14 DIAGNOSIS — Z79899 Other long term (current) drug therapy: Secondary | ICD-10-CM | POA: Diagnosis not present

## 2015-12-14 DIAGNOSIS — I10 Essential (primary) hypertension: Secondary | ICD-10-CM | POA: Diagnosis not present

## 2015-12-14 DIAGNOSIS — F419 Anxiety disorder, unspecified: Secondary | ICD-10-CM | POA: Insufficient documentation

## 2015-12-14 DIAGNOSIS — D696 Thrombocytopenia, unspecified: Secondary | ICD-10-CM

## 2015-12-14 DIAGNOSIS — F17210 Nicotine dependence, cigarettes, uncomplicated: Secondary | ICD-10-CM | POA: Diagnosis not present

## 2015-12-14 DIAGNOSIS — J439 Emphysema, unspecified: Secondary | ICD-10-CM | POA: Diagnosis not present

## 2015-12-14 DIAGNOSIS — R109 Unspecified abdominal pain: Secondary | ICD-10-CM | POA: Insufficient documentation

## 2015-12-14 LAB — CBC WITH DIFFERENTIAL/PLATELET
Basophils Absolute: 0.1 10*3/uL (ref 0–0.1)
Basophils Relative: 1 %
Eosinophils Absolute: 0.2 10*3/uL (ref 0–0.7)
Eosinophils Relative: 3 %
HCT: 41.1 % (ref 35.0–47.0)
Hemoglobin: 14 g/dL (ref 12.0–16.0)
Lymphocytes Relative: 28 %
Lymphs Abs: 2.5 10*3/uL (ref 1.0–3.6)
MCH: 31.4 pg (ref 26.0–34.0)
MCHC: 34 g/dL (ref 32.0–36.0)
MCV: 92.4 fL (ref 80.0–100.0)
Monocytes Absolute: 0.7 10*3/uL (ref 0.2–0.9)
Monocytes Relative: 8 %
Neutro Abs: 5.4 10*3/uL (ref 1.4–6.5)
Neutrophils Relative %: 60 %
Platelets: DECREASED 10*3/uL (ref 150–440)
RBC: 4.45 MIL/uL (ref 3.80–5.20)
RDW: 13.1 % (ref 11.5–14.5)
WBC: 8.9 10*3/uL (ref 3.6–11.0)

## 2015-12-14 NOTE — Progress Notes (Signed)
Patient does not offer any problems today.  

## 2015-12-15 ENCOUNTER — Encounter: Payer: Self-pay | Admitting: Family Medicine

## 2015-12-15 ENCOUNTER — Ambulatory Visit (INDEPENDENT_AMBULATORY_CARE_PROVIDER_SITE_OTHER): Payer: BLUE CROSS/BLUE SHIELD | Admitting: Family Medicine

## 2015-12-15 VITALS — BP 142/94 | HR 99 | Temp 98.4°F | Ht 64.0 in | Wt 192.4 lb

## 2015-12-15 DIAGNOSIS — R079 Chest pain, unspecified: Secondary | ICD-10-CM

## 2015-12-15 DIAGNOSIS — F411 Generalized anxiety disorder: Secondary | ICD-10-CM | POA: Diagnosis not present

## 2015-12-15 DIAGNOSIS — R002 Palpitations: Secondary | ICD-10-CM | POA: Insufficient documentation

## 2015-12-15 DIAGNOSIS — I10 Essential (primary) hypertension: Secondary | ICD-10-CM | POA: Diagnosis not present

## 2015-12-15 MED ORDER — CLONAZEPAM 0.5 MG PO TABS: 0.2500 mg | ORAL_TABLET | Freq: Two times a day (BID) | ORAL | Status: DC | PRN

## 2015-12-15 MED ORDER — AMLODIPINE BESYLATE 10 MG PO TABS: 10.0000 mg | ORAL_TABLET | Freq: Every day | ORAL | Status: DC

## 2015-12-15 MED ORDER — SERTRALINE HCL 50 MG PO TABS: 50.0000 mg | ORAL_TABLET | Freq: Every day | ORAL | Status: DC

## 2015-12-15 NOTE — Progress Notes (Signed)
Pre visit review using our clinic review tool, if applicable. No additional management support is needed unless otherwise documented below in the visit note. 

## 2015-12-15 NOTE — Assessment & Plan Note (Signed)
Worsened recently. Some depression. No SI. We'll refer to therapist. We will add Klonopin as needed. Increase Zoloft to 50 mg daily. She's given return precautions.

## 2015-12-15 NOTE — Progress Notes (Signed)
Patient ID: Carly [name], female   DOB: 16-Dec-1970, 45 y.o.   MRN: [id number]  [name], MD Phone: [phone]  Carly [name] is a 45 y.o. female who presents today for follow-up.  Anxiety: Patient notes this is gone worse. She feels nervous all the time. Can't sleep very much. Has been getting worse over the last month. Doesn't want to leave the house. Some depression. No SI. Is on Zoloft 25 mg daily. As previously seen a therapist though not in a couple of years. Did not tolerate Wellbutrin in the past.  Depression screen PHQ 2/9 12/15/2015  Decreased Interest 2  Down, Depressed, Hopeless 2  PHQ - 2 Score 4  Altered sleeping 3  Tired, decreased energy 3  Change in appetite 1  Feeling bad or failure about yourself  1  Trouble concentrating 1  Moving slowly or fidgety/restless 3  Suicidal thoughts 0  PHQ-9 Score 16  Difficult doing work/chores Somewhat difficult   GAD 7 : Generalized Anxiety Score 12/15/2015  Nervous, Anxious, on Edge 3  Control/stop worrying 3  Worry too much - different things 3  Trouble relaxing 3  Restless 3  Easily annoyed or irritable 3  Afraid - awful might happen 1  Total GAD 7 Score 19  Anxiety Difficulty Somewhat difficult    HYPERTENSION Disease Monitoring Home BP Monitoring has been checking intermittently and can be as high as 150/103 though oftentimes in the normal range. Chest pain- yes, see below    Dyspnea- no Medications Compliance-  taking spironolactone, Toprol, and amlodipine. Lightheadedness-  yes, see below  Edema- no  Patient additionally notes over the last several weeks she has had some symptoms of palpitations, pressure sensation in her upper body, not specifically in her chest. Also some mild lightheadedness. Notes she does have a history of panic attacks and this feels somewhat similar. No shortness of breath but does occasionally feel that she needs to take a deep breath. Has occurred 3 times. Last occurrence was last Friday.  Does smoke and has hypertension though no history of diabetes, hyperlipidemia, or blood clot. She does note her mother died of an MI at age 60. She reports no symptoms at this time.   PMH: Smoker   ROS see history of present illness  Objective  Physical Exam Filed Vitals:   12/15/15 1028 12/15/15 1058  BP: 168/98 142/94  Pulse: 99   Temp: 98.4 F (36.9 C)     BP Readings from Last 3 Encounters:  12/15/15 142/94  12/14/15 145/89  11/09/15 152/90   Wt Readings from Last 3 Encounters:  12/15/15 192 lb 6.4 oz (87.272 kg)  12/14/15 192 lb 14.4 oz (87.5 kg)  11/09/15 191 lb 2.2 oz (86.7 kg)    Physical Exam  Constitutional: She is well-developed, well-nourished, and in no distress.  HENT:  Head: Normocephalic and atraumatic.  Right Ear: External ear normal.  Left Ear: External ear normal.  Cardiovascular: Normal rate, regular rhythm and normal heart sounds.   Pulmonary/Chest: Effort normal and breath sounds normal.  Musculoskeletal: She exhibits no edema.  Neurological: She is alert. Gait normal.  Skin: Skin is warm and dry. She is not diaphoretic.  Psychiatric:  Mood anxious, affect mildly anxious   EKG: Normal sinus rhythm, short PR interval, rate 84, no ST or T-wave changes  Assessment/Plan: Please see individual problem list.  Generalized anxiety disorder Worsened recently. Some depression. No SI. We'll refer to therapist. We will add Klonopin as needed. Increase Zoloft to  50 mg daily. She's given return precautions.  HTN (hypertension) Not at goal. Asymptomatic at this time. We'll increase amlodipine to 10 mg daily. Continue other medications. She'll monitor blood pressure at home. She'll return in 1 week for nurse visit for blood pressure check. She'll follow-up with me in one month.  Palpitations Suspect patient's symptoms most likely related to her anxiety. Recent TSH, hemoglobin, and electrolytes in the normal range. EKG does reveal short PR interval which  could bring in Wolff-Parkinson-White as a cause. She does have a family history of cardiac disease at a young age. We will treat her anxiety as above. We will refer to cardiology for further evaluation given short PR interval and symptomatology. She'll continue to monitor. If symptoms recur she'll seek medical attention. Given return precautions.    Orders Placed This Encounter  Procedures  . Ambulatory referral to Cardiology    Referral Priority:  Routine    Referral Type:  Consultation    Referral Reason:  Specialty Services Required    Requested Specialty:  Cardiology    Number of Visits Requested:  1  . Ambulatory referral to Psychology    Referral Priority:  Routine    Referral Type:  Psychiatric    Referral Reason:  Specialty Services Required    Requested Specialty:  Psychology    Number of Visits Requested:  1  . EKG 12-Lead    Meds ordered this encounter  Medications  . clonazePAM (KLONOPIN) 0.5 MG tablet    Sig: Take 0.5 tablets (0.25 mg total) by mouth 2 (two) times daily as needed for anxiety.    Dispense:  20 tablet    Refill:  0  . sertraline (ZOLOFT) 50 MG tablet    Sig: Take 1 tablet (50 mg total) by mouth daily.    Dispense:  30 tablet    Refill:  2  . amLODipine (NORVASC) 10 MG tablet    Sig: Take 1 tablet (10 mg total) by mouth daily.    Dispense:  90 tablet    Refill:  [place], MD [place]

## 2015-12-15 NOTE — Assessment & Plan Note (Signed)
Suspect patient's symptoms most likely related to her anxiety. Recent TSH, hemoglobin, and electrolytes in the normal range. EKG does reveal short PR interval which could bring in Wolff-Parkinson-White as a cause. She does have a family history of cardiac disease at a young age. We will treat her anxiety as above. We will refer to cardiology for further evaluation given short PR interval and symptomatology. She'll continue to monitor. If symptoms recur she'll seek medical attention. Given return precautions.

## 2015-12-15 NOTE — Assessment & Plan Note (Signed)
Not at goal. Asymptomatic at this time. We'll increase amlodipine to 10 mg daily. Continue other medications. She'll monitor blood pressure at home. She'll return in 1 week for nurse visit for blood pressure check. She'll follow-up with me in one month.

## 2015-12-15 NOTE — Patient Instructions (Signed)
Nice to see you. We are going to increase your Zoloft 50 mg daily. We will refer you to a therapist. We will start your on Klonopin as needed for anxiety. We are going to increase your amlodipine to 10 mg daily for your blood pressure. Please continue to check this. We will have you come back in a week to recheck with the nurse and then with me in a month. We will refer you to cardiology for evaluation of the chest discomfort.  If you develop recurrent persistent chest pain, shortness of breath, lightheadedness, worsening anxiety, thoughts of harming herself or others, or any new or changing symptoms please seek medical attention.

## 2015-12-21 NOTE — Progress Notes (Signed)
[place]  Telephone:(336) [phone] Fax:(336) [phone]  ID: Lin Landsman OB: 1970/08/16  MR#: [id number]  [id number]  Patient Care Team: Leone Haven, MD as PCP - General (Family Medicine)  CHIEF COMPLAINT:  Chief Complaint  Patient presents with  . Thrombocytopenia    INTERVAL HISTORY: Patient returns to clinic today for repeat laboratory work and further evaluation. She currently feels well and is asymptomatic. She denies any easy bleeding or bruising.  She has no neurologic complaints. She denies any recent fevers. She has a good appetite and denies weight loss. She denies any chest pain or shortness of breath. She denies any nausea, vomiting, constipation, or diarrhea. She has no urinary complaints. Patient offers no specific complaints today.  REVIEW OF SYSTEMS:   Review of Systems  Constitutional: Negative for fever, weight loss and malaise/fatigue.  Respiratory: Negative.  Negative for shortness of breath.   Cardiovascular: Negative.  Negative for chest pain.  Gastrointestinal: Negative.  Negative for abdominal pain, blood in stool and melena.  Genitourinary: Negative.   Musculoskeletal: Negative.   Neurological: Negative for weakness.  Endo/Heme/Allergies: Does not bruise/bleed easily.  Psychiatric/Behavioral: Negative.     As per HPI. Otherwise, a complete review of systems is negatve.  PAST MEDICAL HISTORY: Past Medical History  Diagnosis Date  . Chicken pox   . Emphysema of lung ([place])   . Frequent headaches   . Hay fever   . Hypertension   . Anxiety   . Elevated testosterone level in female     PAST SURGICAL HISTORY: Past Surgical History  Procedure Laterality Date  . Tubal ligation  1997  . Tubal ligation      FAMILY HISTORY Family History  Problem Relation Age of Onset  . Hyperlipidemia Mother   . Heart disease Mother   . Stroke Mother   . Depression Mother   . Mental illness Mother   . Hyperlipidemia Father   .  Depression Father   . Mental illness Father   . Diabetes Paternal Grandfather   . Cancer Cousin        ADVANCED DIRECTIVES:    HEALTH MAINTENANCE: Social History  Substance Use Topics  . Smoking status: Current Every Day Smoker -- 0.25 packs/day    Types: Cigarettes  . Smokeless tobacco: Never Used     Comment: Using Patch currently   . Alcohol Use: 4.8 oz/week    8 Standard drinks or equivalent per week     Comment: Beer (Can)      Colonoscopy:  PAP:  Bone density:  Lipid panel:  No Known Allergies  Current Outpatient Prescriptions  Medication Sig Dispense Refill  . budesonide-formoterol (SYMBICORT) 160-4.5 MCG/ACT inhaler Inhale 2 puffs into the lungs 2 (two) times daily. 1 Inhaler 6  . cyclobenzaprine (FLEXERIL) 10 MG tablet Take 1 tablet (10 mg total) by mouth 3 (three) times daily as needed for muscle spasms. (Patient not taking: Reported on 12/15/2015) 30 tablet 0  . ibuprofen (ADVIL,MOTRIN) 200 MG tablet Take 400-600 mg by mouth every 6 (six) hours as needed for headache.    . ipratropium-albuterol (DUONEB) 0.5-2.5 (3) MG/3ML SOLN inhale contents of 1 vial every 4 hours if needed in nebulizer 3 mL 2  . metoprolol succinate (TOPROL-XL) 50 MG 24 hr tablet take 1 tablet by mouth once daily TAKE WITH OR IMMEDIATELY FOLLOWING A MEAL 90 tablet 3  . naproxen (NAPROSYN) 500 MG tablet Take 1 tablet (500 mg total) by mouth 2 (two) times daily with a  meal. 30 tablet 0  . spironolactone (ALDACTONE) 25 MG tablet take 1 tablet by mouth once daily 30 tablet 3  . vitamin B-12 (CYANOCOBALAMIN) 1000 MCG tablet Take 1,000 mcg by mouth daily.    Marland Kitchen amLODipine (NORVASC) 10 MG tablet Take 1 tablet (10 mg total) by mouth daily. 90 tablet 3  . clonazePAM (KLONOPIN) 0.5 MG tablet Take 0.5 tablets (0.25 mg total) by mouth 2 (two) times daily as needed for anxiety. 20 tablet 0  . sertraline (ZOLOFT) 50 MG tablet Take 1 tablet (50 mg total) by mouth daily. 30 tablet 2   No current  facility-administered medications for this visit.    OBJECTIVE: Filed Vitals:   12/14/15 1518  BP: 145/89  Pulse: 98  Temp: 98.4 F (36.9 C)  Resp: 16     Body mass index is 33.1 kg/(m^2).    ECOG FS:0 - Asymptomatic  General: Well-developed, well-nourished, no acute distress. Eyes: Pink conjunctiva, anicteric sclera. Lungs: Clear to auscultation bilaterally. Heart: Regular rate and rhythm. No rubs, murmurs, or gallops. Abdomen: Soft, nontender, nondistended. No organomegaly noted, normoactive bowel sounds. Musculoskeletal: No edema, cyanosis, or clubbing. Neuro: Alert, answering all questions appropriately. Cranial nerves grossly intact. Skin: No rashes or petechiae noted. Psych: Normal affect.   LAB RESULTS:  Lab Results  Component Value Date   NA 137 10/31/2015   K 4.4 10/31/2015   CL 103 10/31/2015   CO2 22 10/31/2015   GLUCOSE 114* 10/31/2015   BUN 14 10/31/2015   CREATININE 0.81 10/31/2015   CALCIUM 9.1 10/31/2015   PROT 7.1 10/31/2015   ALBUMIN 4.0 10/31/2015   AST 16 10/31/2015   ALT 15 10/31/2015   ALKPHOS 63 10/31/2015   BILITOT 0.6 10/31/2015   GFRNONAA >60 11/27/2014   GFRAA >60 11/27/2014    Lab Results  Component Value Date   WBC 8.9 12/14/2015   NEUTROABS 5.4 12/14/2015   HGB 14.0 12/14/2015   HCT 41.1 12/14/2015   MCV 92.4 12/14/2015   PLT  12/14/2015    PLATELET CLUMPS NOTED ON SMEAR, COUNT APPEARS DECREASED    STUDIES: US Abdomen Complete  11/25/2015  CLINICAL DATA:  Right-sided abdominal pain, thrombocytopenia EXAM: ABDOMEN ULTRASOUND COMPLETE COMPARISON:  CT abdomen dated 12/16/2009 FINDINGS: Gallbladder: No gallstones, gallbladder wall thickening, or pericholecystic fluid. Negative sonographic Murphy's sign. Common bile duct: Diameter: 4 mm Liver: Coarse hepatic echotexture. Hyperechoic hepatic parenchyma. No focal hepatic lesion is seen IVC: No abnormality visualized. Pancreas: Visualized portion unremarkable. Spleen: Size and  appearance within normal limits. Right Kidney: Length: 10.4 cm.  No mass or hydronephrosis. Left Kidney: Length: 10.6 cm. 1.4 x 1.2 x 1.2 cm echogenic lesion in the interpolar region, corresponding to a fat containing lesion on prior CT, compatible with a benign renal angiomyolipoma. No hydronephrosis. Abdominal aorta: No aneurysm visualized. Other findings: None. IMPRESSION: 1.4 cm left renal angiomyolipoma, benign. Coarse hepatic echotexture with hyperechoic hepatic parenchyma, suggesting hepatic steatosis. No focal hepatic lesion is seen. Spleen is normal in size. Electronically Signed   By: Julian Hy M.D.   On: 11/25/2015 09:46    ASSESSMENT: Thrombocytopenia, possibly ITP.  PLAN:    1. Thrombocytopenia: Patient platelet count is mildly decreased, but platelet clumping prevented an accurate result. Previously, all of her laboratory work including iron stores, hemolysis labs, antiplatelet antibodies, and ANA are either negative or within normal limits. Abdominal ultrasound did not reveal splenomegaly. Return to clinic in 1 month with repeat laboratory work and then in 2 months with repeat laboratory work  and further evaluation.  2. Hypertension: Blood pressure Mildly elevated today, continue current medications.  Patient expressed understanding and was in agreement with this plan. She also understands that She can call clinic at any time with any questions, concerns, or complaints.    Lloyd Huger, MD   12/21/2015 10:04 PM

## 2015-12-22 ENCOUNTER — Ambulatory Visit (INDEPENDENT_AMBULATORY_CARE_PROVIDER_SITE_OTHER): Payer: BLUE CROSS/BLUE SHIELD

## 2015-12-22 VITALS — BP 130/80 | HR 92

## 2015-12-22 DIAGNOSIS — I10 Essential (primary) hypertension: Secondary | ICD-10-CM

## 2015-12-22 NOTE — Progress Notes (Signed)
I agree with the note. Continue current medication.  Thersa Salt DO

## 2015-12-22 NOTE — Progress Notes (Signed)
Patient in today receiving BP check. Patient has recently started a new BP medication is taking it has prescribed.

## 2016-01-02 ENCOUNTER — Encounter: Payer: Self-pay | Admitting: Family Medicine

## 2016-01-04 ENCOUNTER — Encounter: Payer: Self-pay | Admitting: Cardiology

## 2016-01-04 ENCOUNTER — Ambulatory Visit (INDEPENDENT_AMBULATORY_CARE_PROVIDER_SITE_OTHER): Payer: BLUE CROSS/BLUE SHIELD | Admitting: Cardiology

## 2016-01-04 VITALS — BP 140/80 | HR 120 | Ht 64.0 in | Wt 187.8 lb

## 2016-01-04 DIAGNOSIS — I10 Essential (primary) hypertension: Secondary | ICD-10-CM

## 2016-01-04 DIAGNOSIS — R002 Palpitations: Secondary | ICD-10-CM | POA: Diagnosis not present

## 2016-01-04 DIAGNOSIS — R000 Tachycardia, unspecified: Secondary | ICD-10-CM

## 2016-01-04 DIAGNOSIS — R079 Chest pain, unspecified: Secondary | ICD-10-CM

## 2016-01-04 DIAGNOSIS — R0602 Shortness of breath: Secondary | ICD-10-CM

## 2016-01-04 DIAGNOSIS — E669 Obesity, unspecified: Secondary | ICD-10-CM

## 2016-01-04 DIAGNOSIS — F17200 Nicotine dependence, unspecified, uncomplicated: Secondary | ICD-10-CM

## 2016-01-04 NOTE — Patient Instructions (Addendum)
Medication Instructions:  Your physician recommends that you continue on your current medications as directed. Please refer to the Current Medication list given to you today.   Labwork: Your physician recommends that you return for lab work to [facility] in at the front desk to get registered. Make sure you have this test done early in the morning and nothing to eat or drink the night prior to having this done. Take lab slip with you to the hospital.    Testing/Procedures: Your physician has requested that you have an echocardiogram. Echocardiography is a painless test that uses sound waves to create images of your heart. It provides your doctor with information about the size and shape of your heart and how well your heart's chambers and valves are working. This procedure takes approximately one hour. There are no restrictions for this procedure.  Date & Time: _____________________________________________________  Your physician has recommended that you wear a holter monitor. Holter monitors are medical devices that record the heart's electrical activity. Doctors most often use these monitors to diagnose arrhythmias. Arrhythmias are problems with the speed or rhythm of the heartbeat. The monitor is a small, portable device. You can wear one while you do your normal daily activities. This is usually used to diagnose what is causing palpitations/syncope (passing out).  Date & Time: _______________________________________________________  [facility]  Your caregiver has ordered a Stress Test with nuclear imaging. The purpose of this test is to evaluate the blood supply to your heart muscle. This procedure is referred to as a "Non-Invasive Stress Test." This is because other than having an IV started in your vein, nothing is inserted or "invades" your body. Cardiac stress tests are done to find areas of poor blood flow to the heart by determining the extent of  coronary artery disease (CAD). Some patients exercise on a treadmill, which naturally increases the blood flow to your heart, while others who are  unable to walk on a treadmill due to physical limitations have a pharmacologic/chemical stress agent called Lexiscan . This medicine will mimic walking on a treadmill by temporarily increasing your coronary blood flow.   Please note: these test may take anywhere between 2-4 hours to complete  PLEASE REPORT TO [facility] AT THE FIRST DESK WILL DIRECT YOU WHERE TO GO  Date of Procedure:_Monday January 16, 2016 at 07:30AM____  Arrival Time for Procedure:_Arrive at 07:15AM to register_____  Instructions regarding medication:   _X___ : Hold diabetes medication morning of procedure  __X__:  Hold metoprolol the night before procedure and morning of procedure  __X__:  Hold other medications as follows:__Hold spirnolactone until after your test has been done.    PLEASE NOTIFY THE OFFICE AT LEAST 14 HOURS IN ADVANCE IF YOU ARE UNABLE TO KEEP YOUR APPOINTMENT.  [phone] AND  PLEASE NOTIFY NUCLEAR MEDICINE AT [facility] AT LEAST 24 HOURS IN ADVANCE IF YOU ARE UNABLE TO KEEP YOUR APPOINTMENT. [phone]  How to prepare for your Myoview test:   Do not eat or drink after midnight  No caffeine for 24 hours prior to test  No smoking 24 hours prior to test.  Your medication may be taken with water.  If your doctor stopped a medication because of this test, do not take that medication.  Ladies, please do not wear dresses.  Skirts or pants are appropriate. Please wear a short sleeve shirt.  No perfume, cologne or lotion.  Wear comfortable walking shoes.  No heels!   Follow-Up: Your physician recommends that you schedule a follow-up appointment after testing to review results.   Date & Time: __________________________________________________  It was a pleasure seeing you today here in the office. Please do not hesitate  to give [place] a call back if you have any further questions. Wallace, BSN    Any Other Special Instructions Will Be Listed Below (If Applicable).     If you need a refill on your cardiac medications before your next appointment, please call your pharmacy.  Echocardiogram An echocardiogram, or echocardiography, uses sound waves (ultrasound) to produce an image of your heart. The echocardiogram is simple, painless, obtained within a short period of time, and offers valuable information to your health care provider. The images from an echocardiogram can provide information such as:  Evidence of coronary artery disease (CAD).  Heart size.  Heart muscle function.  Heart valve function.  Aneurysm detection.  Evidence of a past heart attack.  Fluid buildup around the heart.  Heart muscle thickening.  Assess heart valve function. LET [facility] CARE PROVIDER KNOW ABOUT:  Any allergies you have.  All medicines you are taking, including vitamins, herbs, eye drops, creams, and over-the-counter medicines.  Previous problems you or members of your family have had with the use of anesthetics.  Any blood disorders you have.  Previous surgeries you have had.  Medical conditions you have.  Possibility of pregnancy, if this applies. BEFORE THE PROCEDURE  No special preparation is needed. Eat and drink normally.  PROCEDURE   In order to produce an image of your heart, gel will be applied to your chest and a wand-like tool (transducer) will be moved over your chest. The gel will help transmit the sound waves from the transducer. The sound waves will harmlessly bounce off your heart to allow the heart images to be captured in real-time motion. These images will then be recorded.  You may need an IV to receive a medicine that improves the quality of the pictures. AFTER THE PROCEDURE You may return to your normal schedule including diet, activities, and medicines,  unless your health care provider tells you otherwise.   This information is not intended to replace advice given to you by your health care provider. Make sure you discuss any questions you have with your health care provider.   Document Released: 06/29/2000 Document Revised: 07/23/2014 Document Reviewed: 03/09/2013 Elsevier Interactive Patient Education 2016 Elsevier Inc.    Holter Monitoring A Holter monitor is a small device that is used to detect abnormal heart rhythms. It clips to your clothing and is connected by wires to flat, sticky disks (electrodes) that attach to your chest. It is worn continuously for 24-48 hours. HOME CARE INSTRUCTIONS  Wear your Holter monitor at all times, even while exercising and sleeping, for as long as directed by your health care provider.  Make sure that the Holter monitor is safely clipped to your clothing or close to your body as recommended by your health care provider.  Do not get the monitor or wires wet.  Do not put body lotion or moisturizer on your chest.  Keep your skin clean.  Keep a diary of your daily activities, such as walking and doing chores. If you feel that your heartbeat is abnormal or that your heart is fluttering or skipping a beat:  Record what you are doing when it happens.  Record what time of day the symptoms occur.  Return your Holter  monitor as directed by your health care provider.  Keep all follow-up visits as directed by your health care provider. This is important. SEEK IMMEDIATE MEDICAL CARE IF:  You feel lightheaded or you faint.  You have trouble breathing.  You feel pain in your chest, upper arm, or jaw.  You feel sick to your stomach and your skin is pale, cool, or damp.  You heartbeat feels unusual or abnormal.   This information is not intended to replace advice given to you by your health care provider. Make sure you discuss any questions you have with your health care provider.   Document  Released: 03/30/2004 Document Revised: 07/23/2014 Document Reviewed: 02/08/2014 Elsevier Interactive Patient Education 2016 [place].    Pharmacologic Stress Electrocardiogram A pharmacologic stress electrocardiogram is a heart (cardiac) test that uses nuclear imaging to evaluate the blood supply to your heart. This test may also be called a pharmacologic stress electrocardiography. Pharmacologic means that a medicine is used to increase your heart rate and blood pressure.  This stress test is done to find areas of poor blood flow to the heart by determining the extent of coronary artery disease (CAD). Some people exercise on a treadmill, which naturally increases the blood flow to the heart. For those people unable to exercise on a treadmill, a medicine is used. This medicine stimulates your heart and will cause your heart to beat harder and more quickly, as if you were exercising.  Pharmacologic stress tests can help determine:  The adequacy of blood flow to your heart during increased levels of activity in order to clear you for discharge home.  The extent of coronary artery blockage caused by CAD.  Your prognosis if you have suffered a heart attack.  The effectiveness of cardiac procedures done, such as an angioplasty, which can increase the circulation in your coronary arteries.  Causes of chest pain or pressure. LET [facility] CARE PROVIDER KNOW ABOUT:  Any allergies you have.  All medicines you are taking, including vitamins, herbs, eye drops, creams, and over-the-counter medicines.  Previous problems you or members of your family have had with the use of anesthetics.  Any blood disorders you have.  Previous surgeries you have had.  Medical conditions you have.  Possibility of pregnancy, if this applies.  If you are currently breastfeeding. RISKS AND COMPLICATIONS Generally, this is a safe procedure. However, as with any procedure, complications can occur. Possible  complications include:  You develop pain or pressure in the following areas:  Chest.  Jaw or neck.  Between your shoulder blades.  Radiating down your left arm.  Headache.  Dizziness or light-headedness.  Shortness of breath.  Increased or irregular heartbeat.  Low blood pressure.  Nausea or vomiting.  Flushing.  Redness going up the arm and slight pain during injection of medicine.  Heart attack (rare). BEFORE THE PROCEDURE   Avoid all forms of caffeine for 24 hours before your test or as directed by your health care provider. This includes coffee, tea (even decaffeinated tea), caffeinated sodas, chocolate, cocoa, and certain pain medicines.  Follow your health care provider's instructions regarding eating and drinking before the test.  Take your medicines as directed at regular times with water unless instructed otherwise. Exceptions may include:  If you have diabetes, ask how you are to take your insulin or pills. It is common to adjust insulin dosing the morning of the test.  If you are taking beta-blocker medicines, it is important to talk to your health  care provider about these medicines well before the date of your test. Taking beta-blocker medicines may interfere with the test. In some cases, these medicines need to be changed or stopped 24 hours or more before the test.  If you wear a nitroglycerin patch, it may need to be removed prior to the test. Ask your health care provider if the patch should be removed before the test.  If you use an inhaler for any breathing condition, bring it with you to the test.  If you are an outpatient, bring a snack so you can eat right after the stress phase of the test.  Do not smoke for 4 hours prior to the test or as directed by your health care provider.  Do not apply lotions, powders, creams, or oils on your chest prior to the test.  Wear comfortable shoes and clothing. Let your health care provider know if you were  unable to complete or follow the preparations for your test. PROCEDURE   Multiple patches (electrodes) will be put on your chest. If needed, small areas of your chest may be shaved to get better contact with the electrodes. Once the electrodes are attached to your body, multiple wires will be attached to the electrodes, and your heart rate will be monitored.  An IV access will be started. A nuclear trace (isotope) is given. The isotope may be given intravenously, or it may be swallowed. Nuclear refers to several types of radioactive isotopes, and the nuclear isotope lights up the arteries so that the nuclear images are clear. The isotope is absorbed by your body. This results in low radiation exposure.  A resting nuclear image is taken to show how your heart functions at rest.  A medicine is given through the IV access.  A second scan is done about 1 hour after the medicine injection and determines how your heart functions under stress.  During this stress phase, you will be connected to an electrocardiogram machine. Your blood pressure and oxygen levels will be monitored. AFTER THE PROCEDURE   Your heart rate and blood pressure will be monitored after the test.  You may return to your normal schedule, including diet,activities, and medicines, unless your health care provider tells you otherwise.   This information is not intended to replace advice given to you by your health care provider. Make sure you discuss any questions you have with your health care provider.   Document Released: 11/18/2008 Document Revised: 07/07/2013 Document Reviewed: 03/09/2013 Elsevier Interactive Patient Education [employer].

## 2016-01-04 NOTE — Progress Notes (Signed)
Cardiology Office Note   Date:  01/04/2016   ID:  Carly [name], DOB 01/04/1971, MRN [id number]  Referring Doctor:  [name], MD   Cardiologist:   Wende Bushy, MD   Reason for consultation:  Chief Complaint  Patient presents with  . other    Ref by Dr. Caryl Bis for abnormal EKG. Meds reviewed by the patient verbally.   Pt. c/o rapid heart beats, chest pain and dizziness.       History of Present Illness: Carly [name] is a 45 y.o. female who presents for Evaluation for episodes of palpitations and lightheadedness. Patient describes episodes of dizzy spells. These have been going on for about a month now. They occur randomly, without warning, she would feel her heart racing. When he started to beat fast she would feel a pressure in her chest. Then it leads to her feeling lightheaded with patient turning dark. This requires her to hold onto something to avoid falling. When that happens, she starts to feel scared and then notes her heart to race even more. The symptoms last a few minutes at a time, symptoms mainly in the chest. The palpitations and the chest pressure nonradiating. Moderate in intensity. Occurring at least daily or every other day. No known precipitating factors.   When she first started noticing the episodes, she remembers that her anxiety levels picked up due to stress in the family. She had increased crying spells and mood swings. She then went to her PCP for management of her medications. Her Zoloft was adjusted. She then noted improvement in her mood swings. She cannot exactly recall is that affected or improve the episodes of the palpitations and lightheadedness.  She reports chronic shortness of breath from her COPD. This is with moderate exertion. Has been stable over the many years now and improves with her inhalers.  She recalls having chest pressures mainly with episodes of the palpitations.  No fever, cough, colds, abdominal pain. No PND, orthopnea,  edema.   ROS:  Please see the history of present illness. Aside from mentioned under HPI, all other systems are reviewed and negative.     Past Medical History  Diagnosis Date  . Chicken pox   . Emphysema of lung ([place])   . Frequent headaches   . Hay fever   . Hypertension   . Anxiety   . Elevated testosterone level in female     Past Surgical History  Procedure Laterality Date  . Tubal ligation  1997  . Tubal ligation       reports that she has been smoking Cigarettes.  She has a 7 pack-year smoking history. She has never used smokeless tobacco. She reports that she drinks about 4.8 oz of alcohol per week. She reports that she does not use illicit drugs. She had been smoking 1-1.5 packs per day for 10 years. She is down to half pack per day for the last one year. She is trying to quit. She drinks 2 20oz diet [facility] every day.  family history includes Cancer in her cousin; Depression in her father and mother; Diabetes in her paternal grandfather; Heart attack (age of onset: 58) in her mother; Heart disease in her mother; Hyperlipidemia in her father and mother; Mental illness in her father and mother; Stroke in her mother. Mother died at age 57. She was on her way to the hospital for evaluation for chest and arm discomfort. An autopsy was done that showed she died from a  heart attack.  Current Outpatient Prescriptions  Medication Sig Dispense Refill  . amLODipine (NORVASC) 10 MG tablet Take 1 tablet (10 mg total) by mouth daily. 90 tablet 3  . budesonide-formoterol (SYMBICORT) 160-4.5 MCG/ACT inhaler Inhale 2 puffs into the lungs 2 (two) times daily. 1 Inhaler 6  . clonazePAM (KLONOPIN) 0.5 MG tablet Take 0.5 tablets (0.25 mg total) by mouth 2 (two) times daily as needed for anxiety. 20 tablet 0  . cyclobenzaprine (FLEXERIL) 10 MG tablet Take 1 tablet (10 mg total) by mouth 3 (three) times daily as needed for muscle spasms. 30 tablet 0  . ibuprofen (ADVIL,MOTRIN) 200 MG tablet  Take 400-600 mg by mouth every 6 (six) hours as needed for headache.    . ipratropium-albuterol (DUONEB) 0.5-2.5 (3) MG/3ML SOLN inhale contents of 1 vial every 4 hours if needed in nebulizer 3 mL 2  . metoprolol succinate (TOPROL-XL) 50 MG 24 hr tablet take 1 tablet by mouth once daily TAKE WITH OR IMMEDIATELY FOLLOWING A MEAL 90 tablet 3  . naproxen (NAPROSYN) 500 MG tablet Take 1 tablet (500 mg total) by mouth 2 (two) times daily with a meal. 30 tablet 0  . sertraline (ZOLOFT) 50 MG tablet Take 1 tablet (50 mg total) by mouth daily. 30 tablet 2  . spironolactone (ALDACTONE) 25 MG tablet take 1 tablet by mouth once daily 30 tablet 3  . vitamin B-12 (CYANOCOBALAMIN) 1000 MCG tablet Take 1,000 mcg by mouth daily.     No current facility-administered medications for this visit.    Allergies: Review of patient's allergies indicates no known allergies.    PHYSICAL EXAM: VS:  BP 140/80 mmHg  Pulse 120  Ht 5\' 4"  (1.626 m)  Wt 187 lb 12 oz (85.163 kg)  BMI 32.21 kg/m2  LMP 12/08/2015 , Body mass index is 32.21 kg/(m^2). Wt Readings from Last 3 Encounters:  01/04/16 187 lb 12 oz (85.163 kg)  12/15/15 192 lb 6.4 oz (87.272 kg)  12/14/15 192 lb 14.4 oz (87.5 kg)    Heart rate down to the 80s during physical examination. She also reported less chest pressure as her heart rate decreased. GENERAL:  well developed, well nourished,obese, not in acute distress HEENT: normocephalic, pink conjunctivae, anicteric sclerae, no xanthelasma, normal dentition, oropharynx clear NECK:  no neck vein engorgement, JVP normal, no hepatojugular reflux, carotid upstroke brisk and symmetric, no bruit, no thyromegaly, no lymphadenopathy LUNGS:  good respiratory effort, clear to auscultation bilaterally CV:  PMI not displaced, no thrills, no lifts, S1 and S2 within normal limits, no palpable S3 or S4, no murmurs, no rubs, no gallops ABD:  Soft, nontender, nondistended, normoactive bowel sounds, no abdominal aortic  bruit, no hepatomegaly, no splenomegaly MS: nontender back, no kyphosis, no scoliosis, no joint deformities EXT:  2+ DP/PT pulses, no edema, no varicosities, no cyanosis, no clubbing SKIN: warm, nondiaphoretic, normal turgor, no ulcers NEUROPSYCH: alert, oriented to person, place, and time, sensory/motor grossly intact, normal mood, appropriate affect  Recent Labs: 10/31/2015: ALT 15; BUN 14; Creatinine, Ser 0.81; Potassium 4.4; Sodium 137; TSH 2.29 12/14/2015: Hemoglobin 14.0; Platelets PLATELET CLUMPS NOTED ON SMEAR, COUNT APPEARS DECREASED   Lipid Panel    Component Value Date/Time   CHOL 195 09/07/2014 1418   TRIG 234.0* 09/07/2014 1418   HDL 62.10 09/07/2014 1418   CHOLHDL 3 09/07/2014 1418   VLDL 46.8* 09/07/2014 1418   LDLCALC 132 10/22/2012 1259   LDLDIRECT 102.0 09/07/2014 1418     Other studies Reviewed:  EKG:  The ekg from 01/04/2016 was personally reviewed by me and it revealed sinus tachycardia, 121 BPM. Right atrial enlargement. PR interval is normal at 124 ms. The EKG from 12/15/2015 was personally reviewed by me and it revealed sinus rhythm, 89 BPM. PR interval was recalculated and measures a little over 120 ms. Left atrial enlargement.  Additional studies/ records that were reviewed personally reviewed by me today include: None available   ASSESSMENT AND PLAN:  Palpitations Chest pressure associated with the palpitations Lightheadedness associated with the palpitations Shortness of breath  Patient remembers feeling very anxious coming today for her initial visit. We talked about her heart rate being in the 120s but sinus tachycardia. It could very well be that her symptoms are related to anxiety. However, will recommend to rule out significant arrhythmia or cardiac ischemia by doing the following tests: --She will wean herself off of caffeine in it about 2 weeks, we will do a 48 hour Holter monitor --We also recommend that she undergo ischemia evaluation with a  pharmacologic nuclear stress test. Her risk factors for CAD include family history of premature CAD, hypertension, smoking history, obesity. She has baseline shortness of breath and is diagnosed to have COPD. We recommend a pharmacologic nuclear stress test. --We also recommend an echocardiogram.  --Check for plasma fractionated metanephrines. TSH and electrolytes were wnl from recent blood work  Her EKGs were reviewed and PR interval was remeasured from 12/15/2015. It appeared to be within the normal limits.  Hypertension BP is well controlled. Continue monitoring BP. Continue current medical therapy and lifestyle changes.  Tobacco use We discussed the importance of smoking cessation and different strategies for quitting.   Obesity Body mass index is 32.21 kg/(m^2).Marland Kitchen Recommend aggressive weight loss through diet and increased physical activity. Once cardiac workup is done  Current medicines are reviewed at length with the patient today.  The patient does not have concerns regarding medicines.  Labs/ tests ordered today include:  Orders Placed This Encounter  Procedures  . NM Myocar Multi W/Spect W/Wall Motion / EF  . Metanephrines, plasma  . Holter monitor - 48 hour  . EKG 12-Lead  . ECHOCARDIOGRAM COMPLETE    I had a lengthy and detailed discussion with the patient regarding diagnoses, prognosis, diagnostic options, treatment options .   I counseled the patient on importance of lifestyle modification including heart healthy diet, regular physical activity (once cardiac work up is done), and smoking cessation.   Disposition:   FU with undersigned after tests   Signed, Wende Bushy, MD  01/04/2016 9:45 AM    [place]

## 2016-01-13 ENCOUNTER — Inpatient Hospital Stay: Payer: BLUE CROSS/BLUE SHIELD

## 2016-01-13 ENCOUNTER — Other Ambulatory Visit: Payer: BLUE CROSS/BLUE SHIELD

## 2016-01-16 ENCOUNTER — Other Ambulatory Visit
Admission: RE | Admit: 2016-01-16 | Discharge: 2016-01-16 | Disposition: A | Discharge status: Home / Self Care | Payer: BLUE CROSS/BLUE SHIELD | Source: Ambulatory Visit | Attending: Cardiology | Admitting: Cardiology

## 2016-01-16 ENCOUNTER — Inpatient Hospital Stay: Payer: BLUE CROSS/BLUE SHIELD | Attending: Oncology

## 2016-01-16 ENCOUNTER — Encounter
Admission: RE | Admit: 2016-01-16 | Discharge: 2016-01-16 | Disposition: A | Discharge status: Home / Self Care | Payer: BLUE CROSS/BLUE SHIELD | Source: Ambulatory Visit | Attending: Cardiology | Admitting: Cardiology

## 2016-01-16 ENCOUNTER — Ambulatory Visit: Payer: BLUE CROSS/BLUE SHIELD | Admitting: Family Medicine

## 2016-01-16 DIAGNOSIS — R002 Palpitations: Secondary | ICD-10-CM | POA: Diagnosis not present

## 2016-01-16 DIAGNOSIS — R0602 Shortness of breath: Secondary | ICD-10-CM | POA: Diagnosis not present

## 2016-01-16 DIAGNOSIS — R079 Chest pain, unspecified: Secondary | ICD-10-CM | POA: Diagnosis not present

## 2016-01-16 LAB — NM MYOCAR MULTI W/SPECT W/WALL MOTION / EF
LV dias vol: 53 mL (ref 46–106)
LV sys vol: 24 mL
Peak HR: 120 {beats}/min
Rest HR: 92 {beats}/min
SDS: 1
SRS: 7
SSS: 3
TID: 1.19

## 2016-01-16 MED ORDER — REGADENOSON 0.4 MG/5ML IV SOLN
0.4000 mg | Freq: Once | INTRAVENOUS | Status: AC
  Administered 2016-01-16: 0.4 mg via INTRAVENOUS

## 2016-01-16 MED ORDER — TECHNETIUM TC 99M TETROFOSMIN IV KIT
13.0000 | PACK | Freq: Once | INTRAVENOUS | Status: AC | PRN
  Administered 2016-01-16: 13.002 via INTRAVENOUS

## 2016-01-16 MED ORDER — TECHNETIUM TC 99M TETROFOSMIN IV KIT
33.0000 | PACK | Freq: Once | INTRAVENOUS | Status: AC | PRN
  Administered 2016-01-16: 32.78 via INTRAVENOUS

## 2016-01-18 ENCOUNTER — Encounter: Payer: Self-pay | Admitting: Cardiology

## 2016-01-20 ENCOUNTER — Ambulatory Visit (INDEPENDENT_AMBULATORY_CARE_PROVIDER_SITE_OTHER): Payer: BLUE CROSS/BLUE SHIELD | Admitting: Family Medicine

## 2016-01-20 ENCOUNTER — Encounter: Payer: Self-pay | Admitting: Family Medicine

## 2016-01-20 VITALS — BP 128/72 | HR 97 | Temp 98.5°F | Ht 64.0 in | Wt 192.4 lb

## 2016-01-20 DIAGNOSIS — G5600 Carpal tunnel syndrome, unspecified upper limb: Secondary | ICD-10-CM | POA: Insufficient documentation

## 2016-01-20 DIAGNOSIS — G5603 Carpal tunnel syndrome, bilateral upper limbs: Secondary | ICD-10-CM

## 2016-01-20 DIAGNOSIS — F411 Generalized anxiety disorder: Secondary | ICD-10-CM

## 2016-01-20 DIAGNOSIS — R002 Palpitations: Secondary | ICD-10-CM | POA: Diagnosis not present

## 2016-01-20 DIAGNOSIS — M7712 Lateral epicondylitis, left elbow: Secondary | ICD-10-CM | POA: Diagnosis not present

## 2016-01-20 DIAGNOSIS — M7710 Lateral epicondylitis, unspecified elbow: Secondary | ICD-10-CM | POA: Insufficient documentation

## 2016-01-20 NOTE — Assessment & Plan Note (Addendum)
Exam and history in hands most consistent with carpal tunnel syndrome. Neurologically intact today. We will provide her with a prescription for cockup splints to wear at nightly. She will wear these for the next month. If not improving at that time would consider neurology evaluation for EMG studies. Given return precautions.

## 2016-01-20 NOTE — Progress Notes (Signed)
Pre visit review using our clinic review tool, if applicable. No additional management support is needed unless otherwise documented below in the visit note. 

## 2016-01-20 NOTE — Assessment & Plan Note (Signed)
History and exam of left elbow most consistent with lateral epicondylitis. Discussed obtaining a brace from the pharmacy. She can ice the area as well. She'll continue to monitor. If not improving in the next month with conservative treatment would consider sports medicine for ultrasound.

## 2016-01-20 NOTE — Assessment & Plan Note (Addendum)
No recurrence. Is undergoing workup with cardiology. Given return precautions.

## 2016-01-20 NOTE — Progress Notes (Signed)
Patient ID: CYANI SPACE, female   DOB: 07-Jan-1971, 45 y.o.   MRN: [id number]  [name], MD Phone: [phone]  Carly [name] is a 45 y.o. female who presents today for follow-up.  Anxiety: Patient notes this has has gotten quite a bit better. His been taking Zoloft. Klonopin as needed. Still has Klonopin left. Has started seeing a therapist and this has been helpful. Minimal depression. No SI.  Depression screen [facility] 2/9 01/20/2016 12/15/2015  Decreased Interest 1 2  Down, Depressed, Hopeless 1 2  PHQ - 2 Score 2 4  Altered sleeping 3 3  Tired, decreased energy 2 3  Change in appetite 0 1  Feeling bad or failure about yourself  1 1  Trouble concentrating 0 1  Moving slowly or fidgety/restless 0 3  Suicidal thoughts 0 0  PHQ-9 Score 8 16  Difficult doing work/chores Somewhat difficult Somewhat difficult   GAD 7 : Generalized Anxiety Score 01/20/2016 12/15/2015  Nervous, Anxious, on Edge 1 3  Control/stop worrying 1 3  Worry too much - different things 1 3  Trouble relaxing 1 3  Restless 1 3  Easily annoyed or irritable 1 3  Afraid - awful might happen 1 1  Total GAD 7 Score 7 19  Anxiety Difficulty Somewhat difficult Somewhat difficult    Saw cardiology for her chest pain and palpitations. Has not had any recurrence of this. Reports she had stress test that she states was normal. Has not had a Holter monitor done yet given that she is not off caffeine yet.  Lateral epicondylitis: Patient notes left elbow has been hurting for some time. Intermittent sharp. Mostly dull. Occasionally gets some numbness and tingling in her hands bilaterally left greater than right particularly at night and when driving. No weakness in her upper extremities. Ibuprofen helps not very much with the lateral epicondylitis. She does repetitive movements at work and works as a Scientist, water quality at night.  PMH: Smoker   ROS see history of present illness  Objective  Physical Exam Filed Vitals:   01/20/16  0838  BP: 128/72  Pulse: 97  Temp: 98.5 F (36.9 C)    BP Readings from Last 3 Encounters:  01/20/16 128/72  01/04/16 140/80  12/22/15 130/80   Wt Readings from Last 3 Encounters:  01/20/16 192 lb 6.4 oz (87.272 kg)  01/04/16 187 lb 12 oz (85.163 kg)  12/15/15 192 lb 6.4 oz (87.272 kg)    Physical Exam  Constitutional: She is well-developed, well-nourished, and in no distress.  HENT:  Head: Normocephalic and atraumatic.  Cardiovascular: Normal rate, regular rhythm and normal heart sounds.   Pulmonary/Chest: Effort normal and breath sounds normal.  Musculoskeletal:  Left lateral epicondyles with moderate tenderness, pain with resisted pronation at this area, no other tenderness in her forearm, no tenderness in her hand, positive Tinel's in the left hand, right elbow with no tenderness, right hand with no tenderness, positive Tinel's on the right hand  Neurological: She is alert. Gait normal.  5 out of 5 strength bilateral biceps, triceps, and grip, sensation light touch intact in bilateral upper extremities  Skin: Skin is warm and dry. She is not diaphoretic.  Psychiatric: Mood and affect normal.     Assessment/Plan: Please see individual problem list.  Palpitations No recurrence. Is undergoing workup with cardiology. Given return precautions.  Generalized anxiety disorder Quite a bit improved. She'll continue Zoloft and Klonopin. Continue seeing therapist. Given return precautions.  Carpal tunnel syndrome Exam and history in  hands most consistent with carpal tunnel syndrome. Neurologically intact today. We will provide her with a prescription for cockup splints to wear at nightly. She will wear these for the next month. If not improving at that time would consider neurology evaluation for EMG studies. Given return precautions.  Lateral epicondylitis History and exam of left elbow most consistent with lateral epicondylitis. Discussed obtaining a brace from the pharmacy.  She can ice the area as well. She'll continue to monitor. If not improving in the next month with conservative treatment would consider sports medicine for ultrasound.   [name], MD [place]

## 2016-01-20 NOTE — Assessment & Plan Note (Signed)
Quite a bit improved. She'll continue Zoloft and Klonopin. Continue seeing therapist. Given return precautions.

## 2016-01-20 NOTE — Patient Instructions (Signed)
Nice to see you. Please continue your Zoloft and Klonopin as needed. Please continue to follow with the therapist. Please pick up a tennis elbow brace from the pharmacy for your left elbow. You should also ice the area that is painful. We will also give your prescription for cockup splints for your likely carpal tunnel. Please monitor for recurrence of chest pain. If you develop worsening anxiety or depression, or thoughts of harming herself or others, or chest pain, shortness breath, or any new or changing symptoms please seek medical attention.

## 2016-01-24 ENCOUNTER — Encounter: Payer: Self-pay | Admitting: Family Medicine

## 2016-01-31 ENCOUNTER — Ambulatory Visit (INDEPENDENT_AMBULATORY_CARE_PROVIDER_SITE_OTHER): Payer: BLUE CROSS/BLUE SHIELD | Admitting: Family Medicine

## 2016-01-31 ENCOUNTER — Encounter: Payer: Self-pay | Admitting: Family Medicine

## 2016-01-31 VITALS — BP 136/84 | HR 107 | Temp 98.6°F | Ht 64.0 in | Wt 191.0 lb

## 2016-01-31 DIAGNOSIS — J441 Chronic obstructive pulmonary disease with (acute) exacerbation: Secondary | ICD-10-CM | POA: Insufficient documentation

## 2016-01-31 DIAGNOSIS — F17200 Nicotine dependence, unspecified, uncomplicated: Secondary | ICD-10-CM

## 2016-01-31 MED ORDER — NICOTINE POLACRILEX 2 MG MT LOZG: 2.0000 mg | LOZENGE | OROMUCOSAL | Status: DC | PRN

## 2016-01-31 MED ORDER — PREDNISONE 20 MG PO TABS: 40.0000 mg | ORAL_TABLET | Freq: Every day | ORAL | Status: DC

## 2016-01-31 NOTE — Progress Notes (Signed)
Pre visit review using our clinic review tool, if applicable. No additional management support is needed unless otherwise documented below in the visit note. 

## 2016-01-31 NOTE — Progress Notes (Signed)
  Carly Rumps, MD Phone: [phone]  Carly [name] is a 45 y.o. female who presents today for same-day visit.  Patient notes onset of symptoms on Sunday. Started with cough and upper respiratory symptoms with nasal and sinus congestion and postnasal drip. Progressively has moved down into her chest. Nonproductive cough. She has wheezing. MAXIMUM TEMPERATURE 100.27F yesterday. Minimal shortness of breath. No chest pain. Has been using her nebulizer treatments. She is ready to quit smoking as well.  Has tried patches in the past. Also did not react well to Wellbutrin when trying to quit smoking.  PMH: Smoker   ROS see history of present illness  Objective  Physical Exam Filed Vitals:   01/31/16 1031  BP: 136/84  Pulse: 107  Temp: 98.6 F (37 C)    BP Readings from Last 3 Encounters:  01/31/16 136/84  01/20/16 128/72  01/04/16 140/80   Wt Readings from Last 3 Encounters:  01/31/16 191 lb (86.637 kg)  01/20/16 192 lb 6.4 oz (87.272 kg)  01/04/16 187 lb 12 oz (85.163 kg)    Physical Exam  Constitutional: No distress.  HENT:  Head: Normocephalic and atraumatic.  Right Ear: External ear normal.  Left Ear: External ear normal.  Mouth/Throat: Oropharynx is clear and moist. No oropharyngeal exudate.  Eyes: Conjunctivae are normal. Pupils are equal, round, and reactive to light.  Cardiovascular: Normal rate, regular rhythm and normal heart sounds.   Pulmonary/Chest: Effort normal. No respiratory distress. She has wheezes (scattered expiratory wheezes). She has no rales.  Neurological: She is alert. Gait normal.  Skin: Skin is warm and dry. She is not diaphoretic.     Assessment/Plan: Please see individual problem list.  COPD exacerbation ([place]) Current symptoms most consistent with upper respiratory infection and COPD exacerbation. She has no productive cough or purulent sputum though does have minimal shortness of breath with this. Meets 1 out of 3 criteria for COPD  exacerbation. Hold off on antibiotics given this. We'll treat with prednisone. She'll use her nebulizer every 4-6 hours. No focal findings on exam to indicate pneumonia. She will continue to monitor. Given return precautions. If not improving in the next 2 days she'll follow-up.  Tobacco use disorder She's ready to quit smoking. Discussed options. We will trial nicotine lozenges.    No orders of the defined types were placed in this encounter.    Meds ordered this encounter  Medications  . predniSONE (DELTASONE) 20 MG tablet    Sig: Take 2 tablets (40 mg total) by mouth daily with breakfast.    Dispense:  10 tablet    Refill:  0  . nicotine polacrilex ([employer] NICOTINE POLACRILEX) 2 MG lozenge    Sig: Take 1 lozenge (2 mg total) by mouth every 2 (two) hours as needed for smoking cessation.    Dispense:  100 tablet    Refill:  0    Carly Rumps, MD [place]

## 2016-01-31 NOTE — Assessment & Plan Note (Signed)
She's ready to quit smoking. Discussed options. We will trial nicotine lozenges.

## 2016-01-31 NOTE — Assessment & Plan Note (Signed)
Current symptoms most consistent with upper respiratory infection and COPD exacerbation. She has no productive cough or purulent sputum though does have minimal shortness of breath with this. Meets 1 out of 3 criteria for COPD exacerbation. Hold off on antibiotics given this. We'll treat with prednisone. She'll use her nebulizer every 4-6 hours. No focal findings on exam to indicate pneumonia. She will continue to monitor. Given return precautions. If not improving in the next 2 days she'll follow-up.

## 2016-01-31 NOTE — Patient Instructions (Signed)
Nice to see you. You likely have a viral illness leading to upper respiratory symptoms and possible COPD exacerbation. We will start you on prednisone for a 5 day course. You should continue to monitor and if he worsens he should let us know. We will also start you on nicotine lozenges for smoking cessation. If you develop chest pain, shortness of breath, cough productive of blood, fevers, or any new or changing symptoms please seek medical attention.

## 2016-02-12 DIAGNOSIS — D696 Thrombocytopenia, unspecified: Secondary | ICD-10-CM | POA: Insufficient documentation

## 2016-02-12 HISTORY — DX: Thrombocytopenia, unspecified: D69.6

## 2016-02-12 NOTE — Progress Notes (Unsigned)
[place]  [phone] Fax:(336) [phone]  ID: Carly [name] OB: 1970/09/29  MR#: [id number]  [id number]  Patient Care Team: Leone Haven, MD as PCP - General (Family Medicine)  CHIEF COMPLAINT:  Thrombocytopenia  INTERVAL HISTORY: Patient returns to clinic today for repeat laboratory work and further evaluation. She currently feels well and is asymptomatic. She denies any easy bleeding or bruising.  She has no neurologic complaints. She denies any recent fevers. She has a good appetite and denies weight loss. She denies any chest pain or shortness of breath. She denies any nausea, vomiting, constipation, or diarrhea. She has no urinary complaints. Patient offers no specific complaints today.  REVIEW OF SYSTEMS:   Review of Systems  Constitutional: Negative for fever, malaise/fatigue and weight loss.  Respiratory: Negative.  Negative for shortness of breath.   Cardiovascular: Negative.  Negative for chest pain.  Gastrointestinal: Negative.  Negative for abdominal pain, blood in stool and melena.  Genitourinary: Negative.   Musculoskeletal: Negative.   Neurological: Negative for weakness.  Endo/Heme/Allergies: Does not bruise/bleed easily.  Psychiatric/Behavioral: Negative.     As per HPI. Otherwise, a complete review of systems is negatve.  PAST MEDICAL HISTORY: Past Medical History:  Diagnosis Date  . Anxiety   . Chicken pox   . Elevated testosterone level in female   . Emphysema of lung ([place])   . Frequent headaches   . Hay fever   . Hypertension     PAST SURGICAL HISTORY: Past Surgical History:  Procedure Laterality Date  . TUBAL LIGATION  1997  . TUBAL LIGATION      FAMILY HISTORY Family History  Problem Relation Age of Onset  . Hyperlipidemia Mother   . Heart disease Mother   . Stroke Mother   . Depression Mother   . Mental illness Mother   . Heart attack Mother 91  . Hyperlipidemia Father   . Depression Father     . Mental illness Father   . Diabetes Paternal Grandfather   . Cancer Cousin        ADVANCED DIRECTIVES:    HEALTH MAINTENANCE: Social History  Substance Use Topics  . Smoking status: Current Every Day Smoker    Packs/day: 0.25    Years: 28.00    Types: Cigarettes  . Smokeless tobacco: Never Used  . Alcohol use 4.8 oz/week    8 Standard drinks or equivalent per week     Comment: Beer (Can)      Colonoscopy:  PAP:  Bone density:  Lipid panel:  No Known Allergies  Current Outpatient Prescriptions  Medication Sig Dispense Refill  . amLODipine (NORVASC) 10 MG tablet Take 1 tablet (10 mg total) by mouth daily. 90 tablet 3  . budesonide-formoterol (SYMBICORT) 160-4.5 MCG/ACT inhaler Inhale 2 puffs into the lungs 2 (two) times daily. 1 Inhaler 6  . clonazePAM (KLONOPIN) 0.5 MG tablet Take 0.5 tablets (0.25 mg total) by mouth 2 (two) times daily as needed for anxiety. 20 tablet 0  . ibuprofen (ADVIL,MOTRIN) 200 MG tablet Take 400-600 mg by mouth every 6 (six) hours as needed for headache.    . ipratropium-albuterol (DUONEB) 0.5-2.5 (3) MG/3ML SOLN inhale contents of 1 vial every 4 hours if needed in nebulizer 3 mL 2  . metoprolol succinate (TOPROL-XL) 50 MG 24 hr tablet take 1 tablet by mouth once daily TAKE WITH OR IMMEDIATELY FOLLOWING A MEAL 90 tablet 3  . nicotine polacrilex ([employer]S NICOTINE POLACRILEX) 2 MG lozenge Take 1 lozenge (2  mg total) by mouth every 2 (two) hours as needed for smoking cessation. 100 tablet 0  . predniSONE (DELTASONE) 20 MG tablet Take 2 tablets (40 mg total) by mouth daily with breakfast. 10 tablet 0  . sertraline (ZOLOFT) 50 MG tablet Take 1 tablet (50 mg total) by mouth daily. 30 tablet 2  . spironolactone (ALDACTONE) 25 MG tablet take 1 tablet by mouth once daily 30 tablet 3  . vitamin B-12 (CYANOCOBALAMIN) 1000 MCG tablet Take 1,000 mcg by mouth daily.     No current facility-administered medications for this visit.     OBJECTIVE: There were no  vitals filed for this visit.   There is no height or weight on file to calculate BMI.    ECOG FS:0 - Asymptomatic  General: Well-developed, well-nourished, no acute distress. Eyes: Pink conjunctiva, anicteric sclera. Lungs: Clear to auscultation bilaterally. Heart: Regular rate and rhythm. No rubs, murmurs, or gallops. Abdomen: Soft, nontender, nondistended. No organomegaly noted, normoactive bowel sounds. Musculoskeletal: No edema, cyanosis, or clubbing. Neuro: Alert, answering all questions appropriately. Cranial nerves grossly intact. Skin: No rashes or petechiae noted. Psych: Normal affect.   LAB RESULTS:  Lab Results  Component Value Date   NA 137 10/31/2015   K 4.4 10/31/2015   CL 103 10/31/2015   CO2 22 10/31/2015   GLUCOSE 114 (H) 10/31/2015   BUN 14 10/31/2015   CREATININE 0.81 10/31/2015   CALCIUM 9.1 10/31/2015   PROT 7.1 10/31/2015   ALBUMIN 4.0 10/31/2015   AST 16 10/31/2015   ALT 15 10/31/2015   ALKPHOS 63 10/31/2015   BILITOT 0.6 10/31/2015   GFRNONAA >60 11/27/2014   GFRAA >60 11/27/2014    Lab Results  Component Value Date   WBC 8.9 12/14/2015   NEUTROABS 5.4 12/14/2015   HGB 14.0 12/14/2015   HCT 41.1 12/14/2015   MCV 92.4 12/14/2015   PLT  12/14/2015    PLATELET CLUMPS NOTED ON SMEAR, COUNT APPEARS DECREASED    STUDIES: Nm Myocar Multi W/spect W/wall Motion / Ef  Addendum Date: 01/16/2016    There was no ST segment deviation noted during stress.  Pharmacological myocardial perfusion imaging study with no significant  ischemia Normal wall motion, EF estimated at 82% No EKG changes concerning for ischemia at peak stress or in recovery. Low risk scan Signed, Esmond Plants, MD, Ph.D [facility]   Result Date: 01/16/2016 Pharmacological myocardial perfusion imaging study with no significant  ischemia Normal wall motion, EF estimated at 82% No EKG changes concerning for ischemia at peak stress or in recovery. Low risk scan Signed, Esmond Plants, MD, Ph.D  [facility]    ASSESSMENT: Thrombocytopenia, possibly ITP.  PLAN:    1. Thrombocytopenia: Patient platelet count is mildly decreased, but platelet clumping prevented an accurate result. Previously, all of her laboratory work including iron stores, hemolysis labs, antiplatelet antibodies, and ANA are either negative or within normal limits. Abdominal ultrasound did not reveal splenomegaly. Return to clinic in 1 month with repeat laboratory work and then in 2 months with repeat laboratory work and further evaluation.  2. Hypertension: Blood pressure Mildly elevated today, continue current medications.  Patient expressed understanding and was in agreement with this plan. She also understands that She can call clinic at any time with any questions, concerns, or complaints.    Lloyd Huger, MD   02/12/2016 3:08 PM

## 2016-02-13 ENCOUNTER — Inpatient Hospital Stay: Payer: BLUE CROSS/BLUE SHIELD | Attending: Oncology

## 2016-02-13 ENCOUNTER — Inpatient Hospital Stay: Payer: BLUE CROSS/BLUE SHIELD | Attending: Oncology | Admitting: Oncology

## 2016-02-13 ENCOUNTER — Other Ambulatory Visit: Payer: Self-pay | Admitting: Family Medicine

## 2016-02-13 DIAGNOSIS — D696 Thrombocytopenia, unspecified: Secondary | ICD-10-CM

## 2016-02-13 MED ORDER — NICOTINE POLACRILEX 2 MG MT LOZG: 2.0000 mg | LOZENGE | OROMUCOSAL | 0 refills | Status: DC | PRN

## 2016-02-22 ENCOUNTER — Other Ambulatory Visit: Payer: Self-pay | Admitting: *Deleted

## 2016-02-22 DIAGNOSIS — D696 Thrombocytopenia, unspecified: Secondary | ICD-10-CM

## 2016-02-23 ENCOUNTER — Other Ambulatory Visit: Payer: Self-pay | Admitting: Family Medicine

## 2016-02-23 NOTE — Telephone Encounter (Signed)
There is another message about this from patient through [place]

## 2016-03-02 ENCOUNTER — Telehealth: Payer: Self-pay | Admitting: Cardiology

## 2016-03-02 NOTE — Telephone Encounter (Signed)
Left msg on vm for pt to call back and schedule echocardiogram. This is the 3rd attempt to contact patient. Letter mailed, and order removed.

## 2016-03-07 ENCOUNTER — Other Ambulatory Visit: Payer: Self-pay | Admitting: Family Medicine

## 2016-03-11 NOTE — Progress Notes (Deleted)
Carly [name]  Telephone:([phone] Fax:(336) [phone]  ID: Lin Landsman OB: Aug 26, 1970  MR#: [id number]  [id number]  Patient Care Team: Leone Haven, MD as PCP - General (Family Medicine)  CHIEF COMPLAINT:  Thrombocytopenia  INTERVAL HISTORY: Patient returns to clinic today for repeat laboratory work and further evaluation. She currently feels well and is asymptomatic. She denies any easy bleeding or bruising.  She has no neurologic complaints. She denies any recent fevers. She has a good appetite and denies weight loss. She denies any chest pain or shortness of breath. She denies any nausea, vomiting, constipation, or diarrhea. She has no urinary complaints. Patient offers no specific complaints today.  REVIEW OF SYSTEMS:   Review of Systems  Constitutional: Negative for fever, malaise/fatigue and weight loss.  Respiratory: Negative.  Negative for shortness of breath.   Cardiovascular: Negative.  Negative for chest pain.  Gastrointestinal: Negative.  Negative for abdominal pain, blood in stool and melena.  Genitourinary: Negative.   Musculoskeletal: Negative.   Neurological: Negative for weakness.  Endo/Heme/Allergies: Does not bruise/bleed easily.  Psychiatric/Behavioral: Negative.     As per HPI. Otherwise, a complete review of systems is negatve.  PAST MEDICAL HISTORY: Past Medical History:  Diagnosis Date  . Anxiety   . Chicken pox   . Elevated testosterone level in female   . Emphysema of lung ([place])   . Frequent headaches   . Hay fever   . Hypertension     PAST SURGICAL HISTORY: Past Surgical History:  Procedure Laterality Date  . TUBAL LIGATION  1997  . TUBAL LIGATION      FAMILY HISTORY Family History  Problem Relation Age of Onset  . Hyperlipidemia Mother   . Heart disease Mother   . Stroke Mother   . Depression Mother   . Mental illness Mother   . Heart attack Mother 80  . Hyperlipidemia Father   . Depression Father     . Mental illness Father   . Diabetes Paternal Grandfather   . Cancer Cousin        ADVANCED DIRECTIVES:    HEALTH MAINTENANCE: Social History  Substance Use Topics  . Smoking status: Current Every Day Smoker    Packs/day: 0.25    Years: 28.00    Types: Cigarettes  . Smokeless tobacco: Never Used  . Alcohol use 4.8 oz/week    8 Standard drinks or equivalent per week     Comment: Beer (Can)      Colonoscopy:  PAP:  Bone density:  Lipid panel:  No Known Allergies  Current Outpatient Prescriptions  Medication Sig Dispense Refill  . amLODipine (NORVASC) 10 MG tablet Take 1 tablet (10 mg total) by mouth daily. 90 tablet 3  . budesonide-formoterol (SYMBICORT) 160-4.5 MCG/ACT inhaler Inhale 2 puffs into the lungs 2 (two) times daily. 1 Inhaler 6  . clonazePAM (KLONOPIN) 0.5 MG tablet Take 0.5 tablets (0.25 mg total) by mouth 2 (two) times daily as needed for anxiety. 20 tablet 0  . ibuprofen (ADVIL,MOTRIN) 200 MG tablet Take 400-600 mg by mouth every 6 (six) hours as needed for headache.    . ipratropium-albuterol (DUONEB) 0.5-2.5 (3) MG/3ML SOLN inhale contents of 1 vial every 4 hours if needed in nebulizer 3 mL 2  . metoprolol succinate (TOPROL-XL) 50 MG 24 hr tablet take 1 tablet by mouth once daily TAKE WITH OR IMMEDIATELY FOLLOWING A MEAL 90 tablet 3  . predniSONE (DELTASONE) 20 MG tablet Take 2 tablets (40 mg total) by  mouth daily with breakfast. 10 tablet 0  . sertraline (ZOLOFT) 50 MG tablet Take 1 tablet (50 mg total) by mouth daily. 30 tablet 2  . SM NICOTINE 2 MG lozenge USE 1 LOZENGE BY MOUTH EVERY 2 HOURS IF NEEDED FOR SMOKING CESSATION 100 lozenge 0  . spironolactone (ALDACTONE) 25 MG tablet take 1 tablet by mouth once daily 30 tablet 3  . vitamin B-12 (CYANOCOBALAMIN) 1000 MCG tablet Take 1,000 mcg by mouth daily.     No current facility-administered medications for this visit.     OBJECTIVE: There were no vitals filed for this visit.   There is no height or  weight on file to calculate BMI.    ECOG FS:0 - Asymptomatic  General: Well-developed, well-nourished, no acute distress. Eyes: Pink conjunctiva, anicteric sclera. Lungs: Clear to auscultation bilaterally. Heart: Regular rate and rhythm. No rubs, murmurs, or gallops. Abdomen: Soft, nontender, nondistended. No organomegaly noted, normoactive bowel sounds. Musculoskeletal: No edema, cyanosis, or clubbing. Neuro: Alert, answering all questions appropriately. Cranial nerves grossly intact. Skin: No rashes or petechiae noted. Psych: Normal affect.   LAB RESULTS:  Lab Results  Component Value Date   NA 137 10/31/2015   K 4.4 10/31/2015   CL 103 10/31/2015   CO2 22 10/31/2015   GLUCOSE 114 (H) 10/31/2015   BUN 14 10/31/2015   CREATININE 0.81 10/31/2015   CALCIUM 9.1 10/31/2015   PROT 7.1 10/31/2015   ALBUMIN 4.0 10/31/2015   AST 16 10/31/2015   ALT 15 10/31/2015   ALKPHOS 63 10/31/2015   BILITOT 0.6 10/31/2015   GFRNONAA >60 11/27/2014   GFRAA >60 11/27/2014    Lab Results  Component Value Date   WBC 8.9 12/14/2015   NEUTROABS 5.4 12/14/2015   HGB 14.0 12/14/2015   HCT 41.1 12/14/2015   MCV 92.4 12/14/2015   PLT  12/14/2015    PLATELET CLUMPS NOTED ON SMEAR, COUNT APPEARS DECREASED    STUDIES: No results found.  ASSESSMENT: Thrombocytopenia, possibly ITP.  PLAN:    1. Thrombocytopenia: Patient platelet count is mildly decreased, but platelet clumping prevented an accurate result. Previously, all of her laboratory work including iron stores, hemolysis labs, antiplatelet antibodies, and ANA are either negative or within normal limits. Abdominal ultrasound did not reveal splenomegaly. Return to clinic in 1 month with repeat laboratory work and then in 2 months with repeat laboratory work and further evaluation.  2. Hypertension: Blood pressure Mildly elevated today, continue current medications.  Patient expressed understanding and was in agreement with this plan. She  also understands that She can call clinic at any time with any questions, concerns, or complaints.    Lloyd Huger, MD   03/11/2016 11:35 PM

## 2016-03-12 ENCOUNTER — Inpatient Hospital Stay: Payer: BLUE CROSS/BLUE SHIELD

## 2016-03-12 ENCOUNTER — Inpatient Hospital Stay: Payer: BLUE CROSS/BLUE SHIELD | Admitting: Oncology

## 2016-03-27 ENCOUNTER — Other Ambulatory Visit: Payer: Self-pay | Admitting: Family Medicine

## 2016-03-27 MED ORDER — NICOTINE POLACRILEX 2 MG MT LOZG: 2.0000 mg | LOZENGE | OROMUCOSAL | 0 refills | Status: DC | PRN

## 2016-04-04 ENCOUNTER — Other Ambulatory Visit: Payer: Self-pay | Admitting: *Deleted

## 2016-04-11 NOTE — Progress Notes (Deleted)
[place]  [phone] [phone]) [phone]  ID: Carly [name] OB: [birth year]  MR#: [id number]  [id number]  Patient Care Team: Leone Haven, MD as PCP - General (Family Medicine)  CHIEF COMPLAINT: Thrombocytopenia  INTERVAL HISTORY: Patient returns to clinic today for repeat laboratory work and further evaluation. She currently feels well and is asymptomatic. She denies any easy bleeding or bruising.  She has no neurologic complaints. She denies any recent fevers. She has a good appetite and denies weight loss. She denies any chest pain or shortness of breath. She denies any nausea, vomiting, constipation, or diarrhea. She has no urinary complaints. Patient offers no specific complaints today.  REVIEW OF SYSTEMS:   Review of Systems  Constitutional: Negative for fever, malaise/fatigue and weight loss.  Respiratory: Negative.  Negative for shortness of breath.   Cardiovascular: Negative.  Negative for chest pain.  Gastrointestinal: Negative.  Negative for abdominal pain, blood in stool and melena.  Genitourinary: Negative.   Musculoskeletal: Negative.   Neurological: Negative for weakness.  Endo/Heme/Allergies: Does not bruise/bleed easily.  Psychiatric/Behavioral: Negative.     As per HPI. Otherwise, a complete review of systems is negatve.  PAST MEDICAL HISTORY: Past Medical History:  Diagnosis Date  . Anxiety   . Chicken pox   . Elevated testosterone level in female   . Emphysema of lung ([place])   . Frequent headaches   . Hay fever   . Hypertension     PAST SURGICAL HISTORY: Past Surgical History:  Procedure Laterality Date  . TUBAL LIGATION  1997  . TUBAL LIGATION      FAMILY HISTORY Family History  Problem Relation Age of Onset  . Hyperlipidemia Mother   . Heart disease Mother   . Stroke Mother   . Depression Mother   . Mental illness Mother   . Heart attack Mother 19  . Hyperlipidemia Father   . Depression Father     . Mental illness Father   . Diabetes Paternal Grandfather   . Cancer Cousin        ADVANCED DIRECTIVES:    HEALTH MAINTENANCE: Social History  Substance Use Topics  . Smoking status: Current Every Day Smoker    Packs/day: 0.25    Years: 28.00    Types: Cigarettes  . Smokeless tobacco: Never Used  . Alcohol use 4.8 oz/week    8 Standard drinks or equivalent per week     Comment: Beer (Can)      Colonoscopy:  PAP:  Bone density:  Lipid panel:  No Known Allergies  Current Outpatient Prescriptions  Medication Sig Dispense Refill  . amLODipine (NORVASC) 10 MG tablet Take 1 tablet (10 mg total) by mouth daily. 90 tablet 3  . budesonide-formoterol (SYMBICORT) 160-4.5 MCG/ACT inhaler Inhale 2 puffs into the lungs 2 (two) times daily. 1 Inhaler 6  . clonazePAM (KLONOPIN) 0.5 MG tablet Take 0.5 tablets (0.25 mg total) by mouth 2 (two) times daily as needed for anxiety. 20 tablet 0  . ibuprofen (ADVIL,MOTRIN) 200 MG tablet Take 400-600 mg by mouth every 6 (six) hours as needed for headache.    . ipratropium-albuterol (DUONEB) 0.5-2.5 (3) MG/3ML SOLN inhale contents of 1 vial every 4 hours if needed in nebulizer 3 mL 2  . metoprolol succinate (TOPROL-XL) 50 MG 24 hr tablet take 1 tablet by mouth once daily TAKE WITH OR IMMEDIATELY FOLLOWING A MEAL 90 tablet 3  . nicotine polacrilex (SM NICOTINE) 2 MG lozenge Take 1 lozenge (2 mg total)  by mouth as needed for smoking cessation. 100 lozenge 0  . predniSONE (DELTASONE) 20 MG tablet Take 2 tablets (40 mg total) by mouth daily with breakfast. 10 tablet 0  . sertraline (ZOLOFT) 50 MG tablet Take 1 tablet (50 mg total) by mouth daily. 30 tablet 2  . spironolactone (ALDACTONE) 25 MG tablet take 1 tablet by mouth once daily 30 tablet 3  . vitamin B-12 (CYANOCOBALAMIN) 1000 MCG tablet Take 1,000 mcg by mouth daily.     No current facility-administered medications for this visit.     OBJECTIVE: There were no vitals filed for this visit.    There is no height or weight on file to calculate BMI.    ECOG FS:0 - Asymptomatic  General: Well-developed, well-nourished, no acute distress. Eyes: Pink conjunctiva, anicteric sclera. Lungs: Clear to auscultation bilaterally. Heart: Regular rate and rhythm. No rubs, murmurs, or gallops. Abdomen: Soft, nontender, nondistended. No organomegaly noted, normoactive bowel sounds. Musculoskeletal: No edema, cyanosis, or clubbing. Neuro: Alert, answering all questions appropriately. Cranial nerves grossly intact. Skin: No rashes or petechiae noted. Psych: Normal affect.   LAB RESULTS:  Lab Results  Component Value Date   NA 137 10/31/2015   K 4.4 10/31/2015   CL 103 10/31/2015   CO2 22 10/31/2015   GLUCOSE 114 (H) 10/31/2015   BUN 14 10/31/2015   CREATININE 0.81 10/31/2015   CALCIUM 9.1 10/31/2015   PROT 7.1 10/31/2015   ALBUMIN 4.0 10/31/2015   AST 16 10/31/2015   ALT 15 10/31/2015   ALKPHOS 63 10/31/2015   BILITOT 0.6 10/31/2015   GFRNONAA >60 11/27/2014   GFRAA >60 11/27/2014    Lab Results  Component Value Date   WBC 8.9 12/14/2015   NEUTROABS 5.4 12/14/2015   HGB 14.0 12/14/2015   HCT 41.1 12/14/2015   MCV 92.4 12/14/2015   PLT  12/14/2015    PLATELET CLUMPS NOTED ON SMEAR, COUNT APPEARS DECREASED    STUDIES: No results found.  ASSESSMENT: Thrombocytopenia, possibly ITP.  PLAN:    1. Thrombocytopenia: Patient platelet count is mildly decreased, but platelet clumping prevented an accurate result. Previously, all of her laboratory work including iron stores, hemolysis labs, antiplatelet antibodies, and ANA are either negative or within normal limits. Abdominal ultrasound did not reveal splenomegaly. Return to clinic in 1 month with repeat laboratory work and then in 2 months with repeat laboratory work and further evaluation.  2. Hypertension: Blood pressure Mildly elevated today, continue current medications.  Patient expressed understanding and was in agreement  with this plan. She also understands that She can call clinic at any time with any questions, concerns, or complaints.    Lloyd Huger, MD   04/11/2016 11:07 PM

## 2016-04-12 ENCOUNTER — Inpatient Hospital Stay: Payer: BLUE CROSS/BLUE SHIELD | Admitting: Oncology

## 2016-04-12 ENCOUNTER — Inpatient Hospital Stay: Payer: BLUE CROSS/BLUE SHIELD

## 2016-05-08 ENCOUNTER — Ambulatory Visit: Payer: BLUE CROSS/BLUE SHIELD | Admitting: Family Medicine

## 2016-05-08 ENCOUNTER — Ambulatory Visit (INDEPENDENT_AMBULATORY_CARE_PROVIDER_SITE_OTHER): Payer: BLUE CROSS/BLUE SHIELD | Admitting: Family

## 2016-05-08 ENCOUNTER — Telehealth: Payer: Self-pay | Admitting: *Deleted

## 2016-05-08 ENCOUNTER — Encounter: Payer: Self-pay | Admitting: Family

## 2016-05-08 VITALS — BP 140/60 | HR 111 | Temp 98.4°F | Ht 64.0 in | Wt 196.4 lb

## 2016-05-08 DIAGNOSIS — R05 Cough: Secondary | ICD-10-CM | POA: Diagnosis not present

## 2016-05-08 DIAGNOSIS — R000 Tachycardia, unspecified: Secondary | ICD-10-CM

## 2016-05-08 DIAGNOSIS — J029 Acute pharyngitis, unspecified: Secondary | ICD-10-CM | POA: Diagnosis not present

## 2016-05-08 DIAGNOSIS — R059 Cough, unspecified: Secondary | ICD-10-CM

## 2016-05-08 LAB — POCT RAPID STREP A (OFFICE): Rapid Strep A Screen: NEGATIVE

## 2016-05-08 LAB — POCT INFLUENZA A/B
Influenza A, POC: NEGATIVE
Influenza B, POC: NEGATIVE

## 2016-05-08 MED ORDER — DOXYCYCLINE HYCLATE 100 MG PO TABS: 100.0000 mg | ORAL_TABLET | Freq: Two times a day (BID) | ORAL | 0 refills | Status: DC

## 2016-05-08 NOTE — Telephone Encounter (Signed)
FYI Patient reported heart rate being down to 98-100

## 2016-05-08 NOTE — Progress Notes (Signed)
Subjective:    Patient ID: Carly [name], female    DOB: [birth year], 45 y.o.   MRN: [id number]  CC: Carly [name] is a 45 y.o. female who presents today for an acute visit.    HPI: Patient here for acute visit with chief complaint of dry cough and sore throat for one week, worsening. No fevers, chills, difficulty swallowing. Endorses SOB, wheezing with relief nebulizer. Endorses chills and bodyaches. No exposure to strep throat or flu. Cough worse at night. Tried robitussin, mucinex, with little relief.   History of COPD. Continues to smoke.   HTN- hasn't taken amlodipine today. Notices heart will race 'after real bad coughing fit.' She also states her heart will race when she gets sick. Denies exertional chest pain or pressure, numbness or tingling radiating to left arm or jaw, dizziness, frequent headaches, changes in vision.     HISTORY:  Past Medical History:  Diagnosis Date  . Anxiety   . Chicken pox   . Elevated testosterone level in female   . Emphysema of lung ([place])   . Frequent headaches   . Hay fever   . Hypertension    Past Surgical History:  Procedure Laterality Date  . TUBAL LIGATION  1997  . TUBAL LIGATION     Family History  Problem Relation Age of Onset  . Hyperlipidemia Mother   . Heart disease Mother   . Stroke Mother   . Depression Mother   . Mental illness Mother   . Heart attack Mother 38  . Hyperlipidemia Father   . Depression Father   . Mental illness Father   . Diabetes Paternal Grandfather   . Cancer Cousin     Allergies: Review of patient's allergies indicates no known allergies. Current Outpatient Prescriptions on File Prior to Visit  Medication Sig Dispense Refill  . amLODipine (NORVASC) 10 MG tablet Take 1 tablet (10 mg total) by mouth daily. 90 tablet 3  . budesonide-formoterol (SYMBICORT) 160-4.5 MCG/ACT inhaler Inhale 2 puffs into the lungs 2 (two) times daily. 1 Inhaler 6  . clonazePAM (KLONOPIN) 0.5 MG tablet Take 0.5 tablets  (0.25 mg total) by mouth 2 (two) times daily as needed for anxiety. 20 tablet 0  . ibuprofen (ADVIL,MOTRIN) 200 MG tablet Take 400-600 mg by mouth every 6 (six) hours as needed for headache.    . ipratropium-albuterol (DUONEB) 0.5-2.5 (3) MG/3ML SOLN inhale contents of 1 vial every 4 hours if needed in nebulizer 3 mL 2  . metoprolol succinate (TOPROL-XL) 50 MG 24 hr tablet take 1 tablet by mouth once daily TAKE WITH OR IMMEDIATELY FOLLOWING A MEAL 90 tablet 3  . nicotine polacrilex (SM NICOTINE) 2 MG lozenge Take 1 lozenge (2 mg total) by mouth as needed for smoking cessation. 100 lozenge 0  . predniSONE (DELTASONE) 20 MG tablet Take 2 tablets (40 mg total) by mouth daily with breakfast. 10 tablet 0  . sertraline (ZOLOFT) 50 MG tablet Take 1 tablet (50 mg total) by mouth daily. 30 tablet 2  . spironolactone (ALDACTONE) 25 MG tablet take 1 tablet by mouth once daily 30 tablet 3  . vitamin B-12 (CYANOCOBALAMIN) 1000 MCG tablet Take 1,000 mcg by mouth daily.     No current facility-administered medications on file prior to visit.     Social History  Substance Use Topics  . Smoking status: Current Every Day Smoker    Packs/day: 0.25    Years: 28.00    Types: Cigarettes  . Smokeless tobacco: Never  Used  . Alcohol use 4.8 oz/week    8 Standard drinks or equivalent per week     Comment: Beer (Can)     Review of Systems  Constitutional: Negative for chills and fever.  HENT: Positive for sore throat and voice change. Negative for congestion, ear pain, sinus pressure and trouble swallowing.   Respiratory: Positive for cough, shortness of breath and wheezing.   Cardiovascular: Negative for chest pain and palpitations.  Gastrointestinal: Negative for nausea and vomiting.  Neurological: Negative for headaches.      Objective:    BP 140/60   Pulse (!) 115   Temp 98.4 F (36.9 C) (Oral)   Ht 5\' 4"  (1.626 m)   Wt 196 lb 6.4 oz (89.1 kg)   SpO2 98%   BMI 33.71 kg/m    Physical Exam    Constitutional: She appears well-developed and well-nourished.  HENT:  Head: Normocephalic and atraumatic.  Right Ear: Hearing, tympanic membrane, external ear and ear canal normal. No drainage, swelling or tenderness. No foreign bodies. Tympanic membrane is not erythematous and not bulging. No middle ear effusion. No decreased hearing is noted.  Left Ear: Hearing, tympanic membrane, external ear and ear canal normal. No drainage, swelling or tenderness. No foreign bodies. Tympanic membrane is not erythematous and not bulging.  No middle ear effusion. No decreased hearing is noted.  Nose: Nose normal. No rhinorrhea. Right sinus exhibits no maxillary sinus tenderness and no frontal sinus tenderness. Left sinus exhibits no maxillary sinus tenderness and no frontal sinus tenderness.  Mouth/Throat: Uvula is midline, oropharynx is clear and moist and mucous membranes are normal. No oropharyngeal exudate, posterior oropharyngeal edema, posterior oropharyngeal erythema or tonsillar abscesses.  Eyes: Conjunctivae are normal.  Cardiovascular: Normal rate, regular rhythm, normal heart sounds and normal pulses.   Pulmonary/Chest: Effort normal and breath sounds normal. She has no wheezes. She has no rhonchi. She has no rales.  Lymphadenopathy:       Head (right side): No submental, no submandibular, no tonsillar, no preauricular, no posterior auricular and no occipital adenopathy present.       Head (left side): No submental, no submandibular, no tonsillar, no preauricular, no posterior auricular and no occipital adenopathy present.    She has no cervical adenopathy.  Neurological: She is alert.  Skin: Skin is warm and dry.  Psychiatric: She has a normal mood and affect. Her speech is normal and behavior is normal. Thought content normal.  Vitals reviewed.      Assessment & Plan:   1. Cough Working diagnosis of bronchitis. No adventitious lung sounds. Patient is afebrile.Due to duration of symptoms  and h/o COPD, patient and I jointly agreed to start antibiotic. Marland KitchenReturn precautions given.   - POCT Influenza A/B - POCT Rapid Strep A - doxycycline (VIBRA-TABS) 100 MG tablet; Take 1 tablet (100 mg total) by mouth 2 (two) times daily.  Dispense: 10 tablet; Refill: 0  2. Tachycardia HR 98. No chest pain. Neg flu and strep. Expect adequate treatment of infection would lower heartrate. Advised very close monitoring of HR, BP and patient will call us with reading when she is home after she takes her dose of amlodipine.  - POCT Influenza A/B    I am having Ms. Sather maintain her vitamin B-12, ibuprofen, budesonide-formoterol, spironolactone, ipratropium-albuterol, metoprolol succinate, clonazePAM, sertraline, amLODipine, predniSONE, and nicotine polacrilex.   No orders of the defined types were placed in this encounter.   Return precautions given.   Risks, benefits, and alternatives  of the medications and treatment plan prescribed today were discussed, and patient expressed understanding.   Education regarding symptom management and diagnosis given to patient on AVS.  Continue to follow with [name], MD for routine health maintenance.   Lin Landsman and I agreed with plan.   Mable Paris, FNP

## 2016-05-08 NOTE — Patient Instructions (Addendum)
Please monitor BP and HR at home. Please call me tomorrow with values and if elevate over night, please go to ED.   Would expect adequate treatment of infection would lower heartrate.   Increase intake of clear fluids. Congestion is best treated by hydration, when mucus is wetter, it is thinner, less sticky, and easier to expel from the body, either through coughing up drainage, or by blowing your nose.   Get plenty of rest.   Use saline nasal drops and blow your nose frequently. Run a humidifier at night and elevate the head of the bed. Vicks Vapor rub will help with congestion and cough. Steam showers and sinus massage for congestion.   Use Acetaminophen or Ibuprofen as needed for fever or pain. Avoid second hand smoke. Even the smallest exposure will worsen symptoms.   Over the counter medications you can try include Delsym for cough, a decongestant for congestion, and Mucinex or Robitussin as an expectorant. Be sure to just get the plain Mucinex or Robitussin that just has one medication (Guaifenesen). We don't recommend the combination products. Note, be sure to drink two glasses of water with each dose of Mucinex as the medication will not work well without adequate hydration.   You can also try a teaspoon of honey to see if this will help reduce cough. Throat lozenges can sometimes be beneficial as well.    This illness will typically last 7 - 10 days.   Please follow up with our clinic if you develop a fever greater than 101 F, symptoms worsen, or do not resolve in the next week.

## 2016-05-09 ENCOUNTER — Encounter: Payer: Self-pay | Admitting: Family

## 2016-05-09 NOTE — Telephone Encounter (Signed)
FYI

## 2016-05-09 NOTE — Telephone Encounter (Signed)
Patient was seen by Joycelyn Schmid

## 2016-05-09 NOTE — Telephone Encounter (Signed)
noted 

## 2016-05-16 NOTE — Progress Notes (Deleted)
[place]  Telephone:(336) [phone] Fax:(336) [phone]  ID: Carly [name] OB: 07-Feb-1971  MR#: [id number]  [id number]  Patient Care Team: Leone Haven, MD as PCP - General (Family Medicine)  CHIEF COMPLAINT:  Thrombocytopenia  INTERVAL HISTORY: Patient returns to clinic today for repeat laboratory work and further evaluation. She currently feels well and is asymptomatic. She denies any easy bleeding or bruising.  She has no neurologic complaints. She denies any recent fevers. She has a good appetite and denies weight loss. She denies any chest pain or shortness of breath. She denies any nausea, vomiting, constipation, or diarrhea. She has no urinary complaints. Patient offers no specific complaints today.  REVIEW OF SYSTEMS:   Review of Systems  Constitutional: Negative for fever, malaise/fatigue and weight loss.  Respiratory: Negative.  Negative for shortness of breath.   Cardiovascular: Negative.  Negative for chest pain.  Gastrointestinal: Negative.  Negative for abdominal pain, blood in stool and melena.  Genitourinary: Negative.   Musculoskeletal: Negative.   Neurological: Negative for weakness.  Endo/Heme/Allergies: Does not bruise/bleed easily.  Psychiatric/Behavioral: Negative.     As per HPI. Otherwise, a complete review of systems is negatve.  PAST MEDICAL HISTORY: Past Medical History:  Diagnosis Date  . Anxiety   . Chicken pox   . Elevated testosterone level in female   . Emphysema of lung ([place])   . Frequent headaches   . Hay fever   . Hypertension     PAST SURGICAL HISTORY: Past Surgical History:  Procedure Laterality Date  . TUBAL LIGATION  1997  . TUBAL LIGATION      FAMILY HISTORY Family History  Problem Relation Age of Onset  . Hyperlipidemia Mother   . Heart disease Mother   . Stroke Mother   . Depression Mother   . Mental illness Mother   . Heart attack Mother 70  . Hyperlipidemia Father   . Depression Father     . Mental illness Father   . Diabetes Paternal Grandfather   . Cancer Cousin        ADVANCED DIRECTIVES:    HEALTH MAINTENANCE: Social History  Substance Use Topics  . Smoking status: Current Every Day Smoker    Packs/day: 0.25    Years: 28.00    Types: Cigarettes  . Smokeless tobacco: Never Used  . Alcohol use 4.8 oz/week    8 Standard drinks or equivalent per week     Comment: Beer (Can)      Colonoscopy:  PAP:  Bone density:  Lipid panel:  No Known Allergies  Current Outpatient Prescriptions  Medication Sig Dispense Refill  . amLODipine (NORVASC) 10 MG tablet Take 1 tablet (10 mg total) by mouth daily. 90 tablet 3  . budesonide-formoterol (SYMBICORT) 160-4.5 MCG/ACT inhaler Inhale 2 puffs into the lungs 2 (two) times daily. 1 Inhaler 6  . clonazePAM (KLONOPIN) 0.5 MG tablet Take 0.5 tablets (0.25 mg total) by mouth 2 (two) times daily as needed for anxiety. 20 tablet 0  . doxycycline (VIBRA-TABS) 100 MG tablet Take 1 tablet (100 mg total) by mouth 2 (two) times daily. 10 tablet 0  . ibuprofen (ADVIL,MOTRIN) 200 MG tablet Take 400-600 mg by mouth every 6 (six) hours as needed for headache.    . ipratropium-albuterol (DUONEB) 0.5-2.5 (3) MG/3ML SOLN inhale contents of 1 vial every 4 hours if needed in nebulizer 3 mL 2  . metoprolol succinate (TOPROL-XL) 50 MG 24 hr tablet take 1 tablet by mouth once daily TAKE WITH  OR IMMEDIATELY FOLLOWING A MEAL 90 tablet 3  . nicotine polacrilex (SM NICOTINE) 2 MG lozenge Take 1 lozenge (2 mg total) by mouth as needed for smoking cessation. 100 lozenge 0  . predniSONE (DELTASONE) 20 MG tablet Take 2 tablets (40 mg total) by mouth daily with breakfast. 10 tablet 0  . sertraline (ZOLOFT) 50 MG tablet Take 1 tablet (50 mg total) by mouth daily. 30 tablet 2  . spironolactone (ALDACTONE) 25 MG tablet take 1 tablet by mouth once daily 30 tablet 3  . vitamin B-12 (CYANOCOBALAMIN) 1000 MCG tablet Take 1,000 mcg by mouth daily.     No current  facility-administered medications for this visit.     OBJECTIVE: There were no vitals filed for this visit.   There is no height or weight on file to calculate BMI.    ECOG FS:0 - Asymptomatic  General: Well-developed, well-nourished, no acute distress. Eyes: Pink conjunctiva, anicteric sclera. Lungs: Clear to auscultation bilaterally. Heart: Regular rate and rhythm. No rubs, murmurs, or gallops. Abdomen: Soft, nontender, nondistended. No organomegaly noted, normoactive bowel sounds. Musculoskeletal: No edema, cyanosis, or clubbing. Neuro: Alert, answering all questions appropriately. Cranial nerves grossly intact. Skin: No rashes or petechiae noted. Psych: Normal affect.   LAB RESULTS:  Lab Results  Component Value Date   NA 137 10/31/2015   K 4.4 10/31/2015   CL 103 10/31/2015   CO2 22 10/31/2015   GLUCOSE 114 (H) 10/31/2015   BUN 14 10/31/2015   CREATININE 0.81 10/31/2015   CALCIUM 9.1 10/31/2015   PROT 7.1 10/31/2015   ALBUMIN 4.0 10/31/2015   AST 16 10/31/2015   ALT 15 10/31/2015   ALKPHOS 63 10/31/2015   BILITOT 0.6 10/31/2015   GFRNONAA >60 11/27/2014   GFRAA >60 11/27/2014    Lab Results  Component Value Date   WBC 8.9 12/14/2015   NEUTROABS 5.4 12/14/2015   HGB 14.0 12/14/2015   HCT 41.1 12/14/2015   MCV 92.4 12/14/2015   PLT  12/14/2015    PLATELET CLUMPS NOTED ON SMEAR, COUNT APPEARS DECREASED    STUDIES: No results found.  ASSESSMENT: Thrombocytopenia, possibly ITP.  PLAN:    1. Thrombocytopenia: Patient platelet count is mildly decreased, but platelet clumping prevented an accurate result. Previously, all of her laboratory work including iron stores, hemolysis labs, antiplatelet antibodies, and ANA are either negative or within normal limits. Abdominal ultrasound did not reveal splenomegaly. Return to clinic in 1 month with repeat laboratory work and then in 2 months with repeat laboratory work and further evaluation.  2. Hypertension: Blood  pressure Mildly elevated today, continue current medications.  Patient expressed understanding and was in agreement with this plan. She also understands that She can call clinic at any time with any questions, concerns, or complaints.    Lloyd Huger, MD   05/16/2016 4:59 PM

## 2016-05-17 ENCOUNTER — Inpatient Hospital Stay: Payer: BLUE CROSS/BLUE SHIELD

## 2016-05-17 ENCOUNTER — Inpatient Hospital Stay: Payer: BLUE CROSS/BLUE SHIELD | Admitting: Oncology

## 2016-05-22 ENCOUNTER — Telehealth: Payer: Self-pay | Admitting: Family Medicine

## 2016-05-22 MED ORDER — NICOTINE POLACRILEX 2 MG MT LOZG: 2.0000 mg | LOZENGE | OROMUCOSAL | 0 refills | Status: DC | PRN

## 2016-05-22 NOTE — Telephone Encounter (Signed)
Is it ok to refill this?  

## 2016-05-22 NOTE — Telephone Encounter (Signed)
Refill sent to pharmacy.   

## 2016-05-22 NOTE — Telephone Encounter (Signed)
Pt has an appt on 12/4 with Dr. Caryl Bis. She is requesting a refill on her nicotine polacrilex (SM NICOTINE) 2 MG lozenge.

## 2016-05-22 NOTE — Telephone Encounter (Signed)
Tried to call patient to let her know RX was sent to pharmacy. No voicemail

## 2016-05-30 ENCOUNTER — Encounter: Payer: Self-pay | Admitting: Family

## 2016-05-31 ENCOUNTER — Telehealth: Payer: Self-pay | Admitting: Family

## 2016-05-31 DIAGNOSIS — R059 Cough, unspecified: Secondary | ICD-10-CM

## 2016-05-31 DIAGNOSIS — R05 Cough: Secondary | ICD-10-CM

## 2016-05-31 MED ORDER — BENZONATATE 100 MG PO CAPS: 100.0000 mg | ORAL_CAPSULE | Freq: Two times a day (BID) | ORAL | 0 refills | Status: DC | PRN

## 2016-05-31 NOTE — Telephone Encounter (Signed)
Pt called returning your call [phone]

## 2016-05-31 NOTE — Telephone Encounter (Signed)
Mychart has been sent

## 2016-05-31 NOTE — Telephone Encounter (Signed)
Call pt-  Sent in tessalon for cough , esp during day. Advise mucinex, plenty of water  If not better, she needs OV as may need CXR

## 2016-05-31 NOTE — Telephone Encounter (Signed)
Left message for patient to return call back.  

## 2016-06-12 ENCOUNTER — Other Ambulatory Visit: Payer: Self-pay | Admitting: Family Medicine

## 2016-06-18 ENCOUNTER — Encounter: Payer: Self-pay | Admitting: Family Medicine

## 2016-06-18 ENCOUNTER — Ambulatory Visit (INDEPENDENT_AMBULATORY_CARE_PROVIDER_SITE_OTHER): Payer: BLUE CROSS/BLUE SHIELD | Admitting: Family Medicine

## 2016-06-18 VITALS — BP 130/76 | HR 103 | Temp 98.3°F | Wt 194.6 lb

## 2016-06-18 DIAGNOSIS — F411 Generalized anxiety disorder: Secondary | ICD-10-CM

## 2016-06-18 DIAGNOSIS — I10 Essential (primary) hypertension: Secondary | ICD-10-CM | POA: Diagnosis not present

## 2016-06-18 DIAGNOSIS — R351 Nocturia: Secondary | ICD-10-CM | POA: Insufficient documentation

## 2016-06-18 DIAGNOSIS — J449 Chronic obstructive pulmonary disease, unspecified: Secondary | ICD-10-CM | POA: Diagnosis not present

## 2016-06-18 LAB — POCT URINALYSIS DIPSTICK
Bilirubin, UA: NEGATIVE
Glucose, UA: NEGATIVE
Ketones, UA: NEGATIVE
Leukocytes, UA: NEGATIVE
Nitrite, UA: NEGATIVE
Protein, UA: NEGATIVE
Spec Grav, UA: 1.01
Urobilinogen, UA: 0.2
pH, UA: 5.5

## 2016-06-18 MED ORDER — NICOTINE POLACRILEX 2 MG MT GUM: 2.0000 mg | CHEWING_GUM | OROMUCOSAL | 0 refills | Status: DC | PRN

## 2016-06-18 NOTE — Assessment & Plan Note (Signed)
At goal. Continue amlodipine. Lab work as outlined below.

## 2016-06-18 NOTE — Progress Notes (Signed)
Pre visit review using our clinic review tool, if applicable. No additional management support is needed unless otherwise documented below in the visit note. 

## 2016-06-18 NOTE — Assessment & Plan Note (Signed)
Relatively well controlled at this time. Asymptomatic currently. She'll continue to monitor and continue Symbicort. Discussed smoking cessation. We'll try nicotine gum to see if her insurance will cover this. Congratulated her on getting to 5 cigarettes a day.

## 2016-06-18 NOTE — Progress Notes (Signed)
  [name], MD Phone: [phone]  Carly [name] is a 45 y.o. female who presents today for f/u.  HYPERTENSION  Disease Monitoring  Home BP Monitoring 120/80  Chest pain- no    Dyspnea- no Medications  Compliance-  Taking amlodipine.  Edema- no  Nocturia: Patient notes for the last several months she's been urinating 4-5 times at night. She gets a small amount out each time. Does have some urgency at night. Has tried to decrease her liquid intake with little benefit. Notes some leakage of urine with coughing and sneezing. Does feel like she does not empty her bladder fully at times.  COPD: Notes occasional cough since moving to a different area at work. It is nonproductive. No shortness of breath or wheezing. Uses her Symbicort as needed. Rarely uses albuterol. She's down to 5 cigarettes a day. Her insurance company will no longer cover lozenges.  Anxiety: She has transferred at work and got rid of some of the coworkers that were causing her anxiety. She notes this is significantly improved. Taking Zoloft. Rarely takes Klonopin. Has not seen a therapist in the last 2 months. No depression. Mostly she has no anxiety.   PMH: Smoker   ROS see history of present illness  Objective  Physical Exam Vitals:   06/18/16 1437  BP: 130/76  Pulse: (!) 103  Temp: 98.3 F (36.8 C)    BP Readings from Last 3 Encounters:  06/18/16 130/76  05/08/16 140/60  01/31/16 136/84   Wt Readings from Last 3 Encounters:  06/18/16 194 lb 9.6 oz (88.3 kg)  05/08/16 196 lb 6.4 oz (89.1 kg)  01/31/16 191 lb (86.6 kg)    Physical Exam  Constitutional: No distress.  Cardiovascular: Normal rate, regular rhythm and normal heart sounds.   Pulmonary/Chest: Effort normal and breath sounds normal.  Abdominal: Soft. Bowel sounds are normal. She exhibits no distension. There is no tenderness.  Musculoskeletal: She exhibits no edema.  Neurological: She is alert. Gait normal.  Skin: Skin is warm and  dry. She is not diaphoretic.  Psychiatric: Mood and affect normal.     Assessment/Plan: Please see individual problem list.  HTN (hypertension) At goal. Continue amlodipine. Lab work as outlined below.  Nocturia Patient with several months of nocturia. Some mild urgency. Benign abdominal exam. We will check an A1c, kidney function, and UA. Depending on outcome we'll likely refer to urology given her sensation of incomplete emptying of her bladder.  COPD (chronic obstructive pulmonary disease) Relatively well controlled at this time. Asymptomatic currently. She'll continue to monitor and continue Symbicort. Discussed smoking cessation. We'll try nicotine gum to see if her insurance will cover this. Congratulated her on getting to 5 cigarettes a day.  Generalized anxiety disorder Significantly improved. Continue Zoloft. Klonopin as needed. If needs to she will follow-up with her therapist.   Orders Placed This Encounter  Procedures  . Comprehensive metabolic panel  . Hemoglobin A1c  . POCT Urinalysis Dipstick    Meds ordered this encounter  Medications  . nicotine polacrilex (EQ NICOTINE POLACRILEX) 2 MG gum    Sig: Take 1 each (2 mg total) by mouth every 2 (two) hours as needed for smoking cessation.    Dispense:  100 tablet    Refill:  0    [name], MD [place]

## 2016-06-18 NOTE — Assessment & Plan Note (Signed)
Patient with several months of nocturia. Some mild urgency. Benign abdominal exam. We will check an A1c, kidney function, and UA. Depending on outcome we'll likely refer to urology given her sensation of incomplete emptying of her bladder.

## 2016-06-18 NOTE — Assessment & Plan Note (Signed)
Significantly improved. Continue Zoloft. Klonopin as needed. If needs to she will follow-up with her therapist.

## 2016-06-18 NOTE — Patient Instructions (Addendum)
Nice to see you. We will try the nicotine gum for smoking cessation. We will get some lab work and call you with the results. Please monitor anxiety and if this worsens please let us know.  Kegel Exercises The goal of Kegel exercises is to isolate and exercise your pelvic floor muscles. These muscles act as a hammock that supports the rectum, vagina, small intestine, and uterus. As the muscles weaken, the hammock sags and these organs are displaced from their normal positions. Kegel exercises can strengthen your pelvic floor muscles and help you to improve bladder and bowel control, improve sexual response, and help reduce many problems and some discomfort during pregnancy. Kegel exercises can be done anywhere and at any time. HOW TO PERFORM KEGEL EXERCISES 1. Locate your pelvic floor muscles. To do this, squeeze (contract) the muscles that you use when you try to stop the flow of urine. You will feel a tightness in the vaginal area (women) and a tight lift in the rectal area (men and women). 2. When you begin, contract your pelvic muscles tight for 2-5 seconds, then relax them for 2-5 seconds. This is one set. Do 4-5 sets with a short pause in between. 3. Contract your pelvic muscles for 8-10 seconds, then relax them for 8-10 seconds. Do 4-5 sets. If you cannot contract your pelvic muscles for 8-10 seconds, try 5-7 seconds and work your way up to 8-10 seconds. Your goal is 4-5 sets of 10 contractions each day. Keep your stomach, buttocks, and legs relaxed during the exercises. Perform sets of both short and long contractions. Vary your positions. Perform these contractions 3-4 times per day. Perform sets while you are:   Lying in bed in the morning.  Standing at lunch.  Sitting in the late afternoon.  Lying in bed at night. You should do 40-50 contractions per day. Do not perform more Kegel exercises per day than recommended. Overexercising can cause muscle fatigue. Continue these exercises  for for at least 15-20 weeks or as directed by your caregiver. This information is not intended to replace advice given to you by your health care provider. Make sure you discuss any questions you have with your health care provider. Document Released: 06/18/2012 Document Revised: 07/23/2014 Document Reviewed: 05/22/2015 Elsevier Interactive Patient Education  2017 [employer].

## 2016-06-19 ENCOUNTER — Encounter: Payer: Self-pay | Admitting: Family Medicine

## 2016-06-19 LAB — URINE CULTURE

## 2016-06-19 LAB — COMPREHENSIVE METABOLIC PANEL
ALT: 16 IU/L (ref 0–32)
AST: 19 IU/L (ref 0–40)
Albumin/Globulin Ratio: 1.7 (ref 1.2–2.2)
Albumin: 4.1 g/dL (ref 3.5–5.5)
Alkaline Phosphatase: 73 IU/L (ref 39–117)
BUN/Creatinine Ratio: 8 — ABNORMAL LOW (ref 9–23)
BUN: 6 mg/dL (ref 6–24)
Bilirubin Total: 0.7 mg/dL (ref 0.0–1.2)
CO2: 22 mmol/L (ref 18–29)
Calcium: 8.9 mg/dL (ref 8.7–10.2)
Chloride: 99 mmol/L (ref 96–106)
Creatinine, Ser: 0.76 mg/dL (ref 0.57–1.00)
GFR calc Af Amer: 110 mL/min/{1.73_m2} (ref >59)
GFR calc non Af Amer: 96 mL/min/{1.73_m2} (ref >59)
Globulin, Total: 2.4 g/dL (ref 1.5–4.5)
Glucose: 88 mg/dL (ref 65–99)
Potassium: 3.4 mmol/L — ABNORMAL LOW (ref 3.5–5.2)
Sodium: 140 mmol/L (ref 134–144)
Total Protein: 6.5 g/dL (ref 6.0–8.5)

## 2016-06-19 LAB — URINALYSIS, MICROSCOPIC ONLY

## 2016-06-19 LAB — HEMOGLOBIN A1C
Est. average glucose Bld gHb Est-mCnc: 103 mg/dL
Hgb A1c MFr Bld: 5.2 % (ref 4.8–5.6)

## 2016-06-20 ENCOUNTER — Other Ambulatory Visit: Payer: Self-pay | Admitting: Family Medicine

## 2016-06-20 DIAGNOSIS — N3946 Mixed incontinence: Secondary | ICD-10-CM

## 2016-07-06 ENCOUNTER — Other Ambulatory Visit: Payer: Self-pay | Admitting: Family Medicine

## 2016-07-10 ENCOUNTER — Other Ambulatory Visit: Payer: Self-pay | Admitting: Family Medicine

## 2016-07-17 ENCOUNTER — Encounter: Payer: Self-pay | Admitting: Urology

## 2016-07-17 ENCOUNTER — Ambulatory Visit (INDEPENDENT_AMBULATORY_CARE_PROVIDER_SITE_OTHER): Payer: BLUE CROSS/BLUE SHIELD | Admitting: Urology

## 2016-07-17 VITALS — BP 140/90 | HR 106 | Ht 64.0 in | Wt 200.1 lb

## 2016-07-17 DIAGNOSIS — R32 Unspecified urinary incontinence: Secondary | ICD-10-CM | POA: Diagnosis not present

## 2016-07-17 DIAGNOSIS — N393 Stress incontinence (female) (male): Secondary | ICD-10-CM

## 2016-07-17 DIAGNOSIS — R351 Nocturia: Secondary | ICD-10-CM

## 2016-07-17 LAB — URINALYSIS, COMPLETE
Bilirubin, UA: NEGATIVE
Glucose, UA: NEGATIVE
Ketones, UA: NEGATIVE
Leukocytes, UA: NEGATIVE
Nitrite, UA: NEGATIVE
RBC, UA: NEGATIVE
Specific Gravity, UA: >1.03 — ABNORMAL HIGH (ref 1.005–1.030)
Urobilinogen, Ur: 0.2 mg/dL (ref 0.2–1.0)
pH, UA: 5.5 (ref 5.0–7.5)

## 2016-07-17 LAB — BLADDER SCAN AMB NON-IMAGING: Scan Result: 0

## 2016-07-17 LAB — MICROSCOPIC EXAMINATION
Epithelial Cells (non renal): >10 /hpf — AB (ref 0–10)
RBC, UA: NONE SEEN /hpf (ref 0–?)

## 2016-07-17 NOTE — Addendum Note (Signed)
Addended by: Wilson Singer on: 07/17/2016 04:23 PM   Modules accepted: Orders

## 2016-07-17 NOTE — Addendum Note (Signed)
Addended by: Wilson Singer on: 07/17/2016 03:13 PM   Modules accepted: Orders

## 2016-07-17 NOTE — Progress Notes (Signed)
07/17/2016 2:59 PM   Carly [name] 1970-08-15 [id number]  Referring provider: Leone Haven, MD [address] [place], [place] [address]  Chief Complaint  Patient presents with  . Follow-up    frequency urinary, severe leakage    HPI: I was consult to assess the patient's urinary incontinence worsening over many years. She primarily to coughing sneezing bending and lifting as well as during activity. She does not normally have her urge incontinence if she plans around her bladder. She denies bedwetting unless she is coughing. She wears 4-5 pads a day moderately wet to soak in  She voids every 2 hours depending upon fluid intake and gets up 2-3 times a night and denies ankle edema. She reports a good flow and does feel empty  The patient denies a history of bladder infections previous GU surgery. She has no neurologic issues and has not had a hysterectomy  Modifying factors: There are no other modifying factors  Associated signs and symptoms: There are no other associated signs and symptoms Aggravating and relieving factors: There are no other aggravating or relieving factors Severity: Moderate Duration: Persistent   PMH: Past Medical History:  Diagnosis Date  . Anxiety   . Chicken pox   . Elevated testosterone level in female   . Emphysema of lung ([place])   . Frequent headaches   . Hay fever   . Hypertension     Surgical History: Past Surgical History:  Procedure Laterality Date  . TUBAL LIGATION  1997  . TUBAL LIGATION      Home Medications:  Allergies as of 07/17/2016   No Known Allergies     Medication List       Accurate as of 07/17/16  2:59 PM. Always use your most recent med list.          amLODipine 10 MG tablet Commonly known as:  NORVASC Take 1 tablet (10 mg total) by mouth daily.   benzonatate 100 MG capsule Commonly known as:  TESSALON Take 1 capsule (100 mg total) by mouth 2 (two) times daily as needed for cough.     budesonide-formoterol 160-4.5 MCG/ACT inhaler Commonly known as:  SYMBICORT Inhale 2 puffs into the lungs 2 (two) times daily.   clonazePAM 0.5 MG tablet Commonly known as:  KLONOPIN Take 0.5 tablets (0.25 mg total) by mouth 2 (two) times daily as needed for anxiety.   ibuprofen 200 MG tablet Commonly known as:  ADVIL,MOTRIN Take 400-600 mg by mouth every 6 (six) hours as needed for headache.   ipratropium-albuterol 0.5-2.5 (3) MG/3ML Soln Commonly known as:  DUONEB inhale contents of 1 vial every 4 hours if needed in nebulizer   metoprolol succinate 50 MG 24 hr tablet Commonly known as:  TOPROL-XL take 1 tablet by mouth once daily TAKE WITH OR IMMEDIATELY FOLLOWING A MEAL   sertraline 50 MG tablet Commonly known as:  ZOLOFT Take 1 tablet (50 mg total) by mouth daily.   SM NICOTINE POLACRILEX 2 MG gum Generic drug:  nicotine polacrilex take 1 EACH by mouth every 2 hours if needed for SMOKING CESSATION   spironolactone 25 MG tablet Commonly known as:  ALDACTONE take 1 tablet by mouth once daily   vitamin B-12 1000 MCG tablet Commonly known as:  CYANOCOBALAMIN Take 1,000 mcg by mouth daily.       Allergies: No Known Allergies  Family History: Family History  Problem Relation Age of Onset  . Hyperlipidemia Mother   . Heart disease  Mother   . Stroke Mother   . Depression Mother   . Mental illness Mother   . Heart attack Mother 54  . Hyperlipidemia Father   . Depression Father   . Mental illness Father   . Diabetes Paternal Grandfather   . Cancer Cousin     Social History:  reports that she has been smoking Cigarettes.  She has a 7.00 pack-year smoking history. She has never used smokeless tobacco. She reports that she drinks about 4.8 oz of alcohol per week . She reports that she does not use drugs.  ROS: UROLOGY Frequent Urination?: Yes Hard to postpone urination?: Yes Burning/pain with urination?: No Get up at night to urinate?: Yes Leakage of urine?:  Yes Urine stream starts and stops?: No Trouble starting stream?: No Do you have to strain to urinate?: No Blood in urine?: No Urinary tract infection?: No Sexually transmitted disease?: No Injury to kidneys or bladder?: No Painful intercourse?: No Weak stream?: No Currently pregnant?: No Vaginal bleeding?: No Last menstrual period?: No  Gastrointestinal Nausea?: No Vomiting?: No Indigestion/heartburn?: No Diarrhea?: No Constipation?: No  Constitutional Fever: No Night sweats?: No Weight loss?: No Fatigue?: No  Skin Skin rash/lesions?: No Itching?: No  Eyes Blurred vision?: No Double vision?: No  Ears/Nose/Throat Sore throat?: No Sinus problems?: No  Hematologic/Lymphatic Swollen glands?: No Easy bruising?: No  Cardiovascular Leg swelling?: No Chest pain?: No  Respiratory Cough?: No Shortness of breath?: No  Endocrine Excessive thirst?: No  Musculoskeletal Back pain?: No Joint pain?: No  Neurological Headaches?: No Dizziness?: No  Psychologic Depression?: No Anxiety?: No  Physical Exam: BP 140/90   Pulse (!) 106   Ht 5\' 4"  (1.626 m)   Wt 200 lb 1.6 oz (90.8 kg)   BMI 34.35 kg/m   Constitutional:  Alert and oriented, No acute distress. HEENT: [place] AT, moist mucus membranes.  Trachea midline, no masses. Cardiovascular: No clubbing, cyanosis, or edema. Respiratory: Normal respiratory effort, no increased work of breathing. GI: Abdomen is soft, nontender, nondistended, no abdominal masses GU: No CVA tenderness. Mild grade 2 hyper mobility the bladder neck and a positive cough test Skin: No rashes, bruises or suspicious lesions. Lymph: No cervical or inguinal adenopathy. Neurologic: Grossly intact, no focal deficits, moving all 4 extremities. Psychiatric: Normal mood and affect.  Laboratory Data: Lab Results  Component Value Date   WBC 8.9 12/14/2015   HGB 14.0 12/14/2015   HCT 41.1 12/14/2015   MCV 92.4 12/14/2015   PLT  12/14/2015      PLATELET CLUMPS NOTED ON SMEAR, COUNT APPEARS DECREASED    Lab Results  Component Value Date   CREATININE 0.76 06/18/2016    No results found for: PSA  Lab Results  Component Value Date   TESTOSTERONE 114 (H) 09/07/2014    Lab Results  Component Value Date   HGBA1C 5.2 06/18/2016    Urinalysis    Component Value Date/Time   COLORURINE Colorless 10/06/2012 1138   APPEARANCEUR Clear 10/06/2012 1138   LABSPEC 1.002 10/06/2012 1138   PHURINE 7.0 10/06/2012 1138   GLUCOSEU Negative 10/06/2012 1138   HGBUR Negative 10/06/2012 1138   BILIRUBINUR negative 06/18/2016 1527   BILIRUBINUR Negative 10/06/2012 1138   KETONESUR Negative 10/06/2012 1138   PROTEINUR negative 06/18/2016 1527   PROTEINUR Negative 10/06/2012 1138   UROBILINOGEN 0.2 06/18/2016 1527   NITRITE negative 06/18/2016 1527   NITRITE Negative 10/06/2012 1138   LEUKOCYTESUR Negative 06/18/2016 1527   LEUKOCYTESUR Negative 10/06/2012 1138  Pertinent Imaging: none  Assessment & Plan:  The patient primarily has stress incontinence but likely has some urge incontinence if she does not mind her bladder. She has mild to moderate frequency and nocturia somewhat fluid dependent  The role of urodynamics was discussed  Based upon the evaluation today I would not be surprised if the patient requires a sling to reach her treatment goal. Is not fit our stress incontinence study because she is not postmenopausal   There are no diagnoses linked to this encounter.  Return in about 4 weeks (around 08/14/2016) for UDS in [place] and see me following test.  Reece Packer, MD  [facility] [address] [place],  [address] [phone]

## 2016-07-27 ENCOUNTER — Encounter: Payer: Self-pay | Admitting: Family Medicine

## 2016-07-27 ENCOUNTER — Ambulatory Visit (INDEPENDENT_AMBULATORY_CARE_PROVIDER_SITE_OTHER): Payer: BLUE CROSS/BLUE SHIELD | Admitting: Family Medicine

## 2016-07-27 VITALS — BP 120/90 | HR 106 | Temp 98.1°F | Wt 199.0 lb

## 2016-07-27 DIAGNOSIS — J441 Chronic obstructive pulmonary disease with (acute) exacerbation: Secondary | ICD-10-CM

## 2016-07-27 MED ORDER — BUDESONIDE-FORMOTEROL FUMARATE 160-4.5 MCG/ACT IN AERO: 2.0000 | INHALATION_SPRAY | Freq: Two times a day (BID) | RESPIRATORY_TRACT | 6 refills | Status: DC

## 2016-07-27 MED ORDER — AZITHROMYCIN 250 MG PO TABS: ORAL_TABLET | ORAL | 0 refills | Status: DC

## 2016-07-27 MED ORDER — PREDNISONE 20 MG PO TABS
40.0000 mg | ORAL_TABLET | Freq: Every day | ORAL | 0 refills | Status: DC
Start: 2016-07-27 — End: 2016-08-15

## 2016-07-27 MED ORDER — HYDROCODONE-HOMATROPINE 5-1.5 MG/5ML PO SYRP: 5.0000 mL | ORAL_SOLUTION | Freq: Three times a day (TID) | ORAL | 0 refills | Status: DC | PRN

## 2016-07-27 NOTE — Patient Instructions (Signed)
Keep up the good work with quitting smoking  Drink lots of fluids  Use your nebulizer every 4-6 hours as needed  Restart your symbicort Chronic Obstructive Pulmonary Disease Exacerbation Chronic obstructive pulmonary disease (COPD) is a common lung condition in which airflow from the lungs is limited. COPD is a general term that can be used to describe many different lung problems that limit airflow, including chronic bronchitis and emphysema. COPD exacerbations are episodes when breathing symptoms become much worse and require extra treatment. Without treatment, COPD exacerbations can be life threatening, and frequent COPD exacerbations can cause further damage to your lungs. What are the causes?  Respiratory infections.  Exposure to smoke.  Exposure to air pollution, chemical fumes, or dust. Sometimes there is no apparent cause or trigger. What increases the risk?  Smoking cigarettes.  Older age.  Frequent prior COPD exacerbations. What are the signs or symptoms?  Increased coughing.  Increased thick spit (sputum) production.  Increased wheezing.  Increased shortness of breath.  Rapid breathing.  Chest tightness. How is this diagnosed? Your medical history, a physical exam, and tests will help your health care provider make a diagnosis. Tests may include:  A chest X-ray.  Basic lab tests.  Sputum testing.  An arterial blood gas test. How is this treated? Depending on the severity of your COPD exacerbation, you may need to be admitted to a hospital for treatment. Some of the treatments commonly used to treat COPD exacerbations are:  Antibiotic medicines.  Bronchodilators. These are drugs that expand the air passages. They may be given with an inhaler or nebulizer. Spacer devices may be needed to help improve drug delivery.  Corticosteroid medicines.  Supplemental oxygen therapy.  Airway clearing techniques, such as noninvasive ventilation (NIV) and positive  expiratory pressure (PEP). These provide respiratory support through a mask or other noninvasive device. Follow these instructions at home:  Do not smoke. Quitting smoking is very important to prevent COPD from getting worse and exacerbations from happening as often.  Avoid exposure to all substances that irritate the airway, especially to tobacco smoke.  If you were prescribed an antibiotic medicine, finish it all even if you start to feel better.  Take all medicines as directed by your health care provider.It is important to use correct technique with inhaled medicines.  Drink enough fluids to keep your urine clear or pale yellow (unless you have a medical condition that requires fluid restriction).  Use a cool mist vaporizer. This makes it easier to clear your chest when you cough.  If you have a home nebulizer and oxygen, continue to use them as directed.  Maintain all necessary vaccinations to prevent infections.  Exercise regularly.  Eat a healthy diet.  Keep all follow-up appointments as directed by your health care provider. Get help right away if:  You have worsening shortness of breath.  You have trouble talking.  You have severe chest pain.  You have blood in your sputum.  You have a fever.  You have weakness, vomit repeatedly, or faint.  You feel confused.  You continue to get worse. This information is not intended to replace advice given to you by your health care provider. Make sure you discuss any questions you have with your health care provider. Document Released: 04/29/2007 Document Revised: 12/08/2015 Document Reviewed: 03/06/2013 Elsevier Interactive Patient Education  2017 [employer].

## 2016-07-27 NOTE — Progress Notes (Signed)
Subjective:    Patient ID: Lin Landsman, female    DOB: 01-06-1971, 46 y.o.   MRN: [id number]  HPI This is a 46 yo female who presents today with 10 days of cold symptoms and has gone to her lungs. Has had larygitis x 1 week. Cough is dry. Nasal drainage with green mucus. Subjective fever off and on. No SOB, hears some wheezing. Has albuterol nebulizer at home and has been using 2-3 times a day, intermittent relief. Chest sore from coughing. Throat feels raw. Ran out of symbicort about a month ago, requests refill Mucinex DM every four hours. Has reduced cigarettes to 2-3 per day, using nicotine gum.   Past Medical History:  Diagnosis Date  . Anxiety   . Chicken pox   . Elevated testosterone level in female   . Emphysema of lung ([place])   . Frequent headaches   . Hay fever   . Hypertension    Past Surgical History:  Procedure Laterality Date  . TUBAL LIGATION  1997  . TUBAL LIGATION     Family History  Problem Relation Age of Onset  . Hyperlipidemia Mother   . Heart disease Mother   . Stroke Mother   . Depression Mother   . Mental illness Mother   . Heart attack Mother 56  . Hyperlipidemia Father   . Depression Father   . Mental illness Father   . Diabetes Paternal Grandfather   . Cancer Cousin    Social History  Substance Use Topics  . Smoking status: Current Every Day Smoker    Packs/day: 0.25    Years: 28.00    Types: Cigarettes  . Smokeless tobacco: Never Used  . Alcohol use 4.8 oz/week    8 Standard drinks or equivalent per week     Comment: Beer (Can)       Review of Systems Per HPI     Objective:   Physical Exam  Constitutional: She is oriented to person, place, and time. She appears well-developed and well-nourished. No distress.  HENT:  Head: Normocephalic and atraumatic.  Right Ear: External ear normal.  Left Ear: External ear normal.  Nose: Nose normal.  Mouth/Throat: Posterior oropharyngeal edema and posterior oropharyngeal erythema present.  No oropharyngeal exudate.  +2 tonsils  Eyes: Conjunctivae are normal.  Neck: Normal range of motion. Neck supple.  Cardiovascular: Normal rate, regular rhythm and normal heart sounds.   Pulmonary/Chest: Effort normal. No respiratory distress. She has wheezes (expiratory wheezes throughout). She has no rales.  Lymphadenopathy:    She has no cervical adenopathy.  Neurological: She is alert and oriented to person, place, and time.  Skin: Skin is warm and dry. She is not diaphoretic.  Psychiatric: She has a normal mood and affect. Her behavior is normal. Judgment and thought content normal.  Vitals reviewed.        BP 120/90   Pulse (!) 106   Temp 98.1 F (36.7 C)   Wt 199 lb (90.3 kg)   SpO2 98%   BMI 34.16 kg/m  Wt Readings from Last 3 Encounters:  07/27/16 199 lb (90.3 kg)  07/17/16 200 lb 1.6 oz (90.8 kg)  06/18/16 194 lb 9.6 oz (88.3 kg)    Assessment & Plan:  1. COPD exacerbation ([place]) - Provided written and verbal information regarding diagnosis and treatment. - RTC/ED precautions reviewed  - encouraged continued smoking cessation/decrease - increase fluids - budesonide-formoterol (SYMBICORT) 160-4.5 MCG/ACT inhaler; Inhale 2 puffs into the lungs 2 (two) times  daily.  Dispense: 1 Inhaler; Refill: 6 - azithromycin (ZITHROMAX) 250 MG tablet; Take 2 tabs PO x 1 dose, then 1 tab PO QD x 4 days  Dispense: 6 tablet; Refill: 0 - predniSONE (DELTASONE) 20 MG tablet; Take 2 tablets (40 mg total) by mouth daily with breakfast.  Dispense: 10 tablet; Refill: 0 - HYDROcodone-homatropine (HYCODAN) 5-1.5 MG/5ML syrup; Take 5 mLs by mouth every 8 (eight) hours as needed for cough.  Dispense: 120 mL; Refill: 0   Clarene Reamer, FNP-BC  [facility]  07/27/2016 4:07 PM

## 2016-08-07 ENCOUNTER — Encounter: Payer: Self-pay | Admitting: Family Medicine

## 2016-08-15 ENCOUNTER — Encounter: Payer: Self-pay | Admitting: Family Medicine

## 2016-08-15 ENCOUNTER — Ambulatory Visit: Payer: BLUE CROSS/BLUE SHIELD

## 2016-08-15 ENCOUNTER — Ambulatory Visit (INDEPENDENT_AMBULATORY_CARE_PROVIDER_SITE_OTHER): Payer: BLUE CROSS/BLUE SHIELD | Admitting: Family Medicine

## 2016-08-15 VITALS — BP 150/96 | HR 106 | Temp 98.6°F | Wt 201.8 lb

## 2016-08-15 DIAGNOSIS — G4700 Insomnia, unspecified: Secondary | ICD-10-CM | POA: Insufficient documentation

## 2016-08-15 DIAGNOSIS — F411 Generalized anxiety disorder: Secondary | ICD-10-CM

## 2016-08-15 DIAGNOSIS — I10 Essential (primary) hypertension: Secondary | ICD-10-CM | POA: Diagnosis not present

## 2016-08-15 LAB — BASIC METABOLIC PANEL
BUN: 7 mg/dL (ref 6–23)
CO2: 27 mEq/L (ref 19–32)
Calcium: 9.2 mg/dL (ref 8.4–10.5)
Chloride: 106 mEq/L (ref 96–112)
Creatinine, Ser: 0.78 mg/dL (ref 0.40–1.20)
GFR: 84.86 mL/min (ref >60.00)
Glucose, Bld: 141 mg/dL — ABNORMAL HIGH (ref 70–99)
Potassium: 4 mEq/L (ref 3.5–5.1)
Sodium: 139 mEq/L (ref 135–145)

## 2016-08-15 MED ORDER — CLONAZEPAM 0.5 MG PO TABS: 0.2500 mg | ORAL_TABLET | Freq: Two times a day (BID) | ORAL | 0 refills | Status: DC | PRN

## 2016-08-15 MED ORDER — METOPROLOL SUCCINATE ER 50 MG PO TB24: 100.0000 mg | ORAL_TABLET | Freq: Every day | ORAL | 3 refills | Status: DC

## 2016-08-15 MED ORDER — SERTRALINE HCL 100 MG PO TABS: 100.0000 mg | ORAL_TABLET | Freq: Every day | ORAL | 3 refills | Status: DC

## 2016-08-15 MED ORDER — SPIRONOLACTONE 25 MG PO TABS: 50.0000 mg | ORAL_TABLET | Freq: Every day | ORAL | 3 refills | Status: DC

## 2016-08-15 MED ORDER — SPIRONOLACTONE 25 MG PO TABS: 25.0000 mg | ORAL_TABLET | Freq: Every day | ORAL | 3 refills | Status: DC

## 2016-08-15 NOTE — Progress Notes (Signed)
  [name], MD Phone: [phone]  Carly [name] is a 46 y.o. female who presents today for follow-up.  Insomnia: Patient feels that she just can't shut her mind off. She falls asleep easily for 2-3 hours and then is awake. Just can't shut her mind off with this. Has been going on for the last 2-3 weeks.  Patient notes her anxiety is worsening as well. Her job has become more stressful than she thought it was going to be. She notes she is on Zoloft. Not taking Klonopin as it ran out. She notes some depression with dreading going to work. No SI or HI. Does have crying spells and anxiety attacks one to 2 times a day. She just feels scared to death when these occur.  Hypertension: Checking at home and typically 140/90. Takes amlodipine, Aldactone, and metoprolol. No chest pain, shortness breath, or edema.  PMH: Smoker   ROS see history of present illness  Objective  Physical Exam Vitals:   08/15/16 0801  BP: (!) 150/96  Pulse: (!) 106  Temp: 98.6 F (37 C)    BP Readings from Last 3 Encounters:  08/15/16 (!) 150/96  07/27/16 120/90  07/17/16 140/90   Wt Readings from Last 3 Encounters:  08/15/16 201 lb 12.8 oz (91.5 kg)  07/27/16 199 lb (90.3 kg)  07/17/16 200 lb 1.6 oz (90.8 kg)    Physical Exam  Constitutional: No distress.  Cardiovascular: Regular rhythm and normal heart sounds.  Tachycardia present.   Pulmonary/Chest: Effort normal and breath sounds normal.  Musculoskeletal: She exhibits no edema.  Skin: She is not diaphoretic.  Psychiatric:  Mood anxious, affect flat     Assessment/Plan: Please see individual problem list.  HTN (hypertension) Not at goal. To keep her regimen simple we will increase her metoprolol dose. She will continue her Aldactone and amlodipine. Follow-up in one month.  Generalized anxiety disorder Worsened. Some depression as well. We will increase her Zoloft. Restart on Klonopin. Follow-up in one month.  Insomnia I suspect  this is related to her anxiety. Discussed taking the Klonopin prior to bed to help with her nighttime anxiety. If not improving in the future could consider alternative medication   Orders Placed This Encounter  Procedures  . Basic Metabolic Panel (BMET)    [name], MD [place]

## 2016-08-15 NOTE — Assessment & Plan Note (Addendum)
Not at goal. To keep her regimen simple we will increase her metoprolol dose. She will continue her Aldactone and amlodipine. Follow-up in one month.

## 2016-08-15 NOTE — Assessment & Plan Note (Signed)
Worsened. Some depression as well. We will increase her Zoloft. Restart on Klonopin. Follow-up in one month.

## 2016-08-15 NOTE — Assessment & Plan Note (Signed)
I suspect this is related to her anxiety. Discussed taking the Klonopin prior to bed to help with her nighttime anxiety. If not improving in the future could consider alternative medication

## 2016-08-15 NOTE — Patient Instructions (Addendum)
Nice to see you. We will increase your Zoloft dose. You can take the Klonopin up to twice daily to help with your anxiety. You can take your evening dose prior to bed to help with your nighttime anxiety. We will increase your spironolactone dose to help with her blood pressure. - This was addended after this was printed. We decided to increase her metoprolol dose instead of Aldactone. CMA contacted pharmacy left message to cancel Aldactone prescription. I discussed not changing the Aldactone prescription with the patient and advise that we would send in metoprolol. Please monitor your blood pressure at home. We will see back in a month.

## 2016-08-15 NOTE — Progress Notes (Signed)
Pre visit review using our clinic review tool, if applicable. No additional management support is needed unless otherwise documented below in the visit note. 

## 2016-08-22 ENCOUNTER — Other Ambulatory Visit: Payer: Self-pay | Admitting: Family Medicine

## 2016-08-22 NOTE — Telephone Encounter (Signed)
Last OV 08/15/16 last filled 07/10/16 100 0rf

## 2016-09-03 ENCOUNTER — Other Ambulatory Visit: Payer: Self-pay | Admitting: Family Medicine

## 2016-09-03 NOTE — Telephone Encounter (Signed)
Last OV 08/15/16 last filled 08/22/16

## 2016-09-04 ENCOUNTER — Other Ambulatory Visit: Payer: Self-pay | Admitting: Family Medicine

## 2016-09-05 ENCOUNTER — Other Ambulatory Visit: Payer: Self-pay | Admitting: Family Medicine

## 2016-09-06 ENCOUNTER — Other Ambulatory Visit: Payer: Self-pay | Admitting: Family Medicine

## 2016-09-06 MED ORDER — NICOTINE POLACRILEX 2 MG MT GUM: 2.0000 mg | CHEWING_GUM | OROMUCOSAL | 0 refills | Status: DC | PRN

## 2016-09-06 NOTE — Telephone Encounter (Signed)
Last OV 08/15/2016 last filled 08/22/16 100 0rf

## 2016-10-22 ENCOUNTER — Ambulatory Visit (INDEPENDENT_AMBULATORY_CARE_PROVIDER_SITE_OTHER): Payer: BLUE CROSS/BLUE SHIELD | Admitting: Family Medicine

## 2016-10-22 ENCOUNTER — Encounter: Payer: Self-pay | Admitting: Family Medicine

## 2016-10-22 VITALS — BP 140/94 | HR 103 | Temp 98.8°F | Wt 199.0 lb

## 2016-10-22 DIAGNOSIS — I10 Essential (primary) hypertension: Secondary | ICD-10-CM | POA: Diagnosis not present

## 2016-10-22 DIAGNOSIS — B852 Pediculosis, unspecified: Secondary | ICD-10-CM | POA: Diagnosis not present

## 2016-10-22 DIAGNOSIS — N920 Excessive and frequent menstruation with regular cycle: Secondary | ICD-10-CM | POA: Insufficient documentation

## 2016-10-22 LAB — CBC
HCT: 43.7 % (ref 36.0–46.0)
Hemoglobin: 13.9 g/dL (ref 12.0–15.0)
MCHC: 31.7 g/dL (ref 30.0–36.0)
MCV: 80.3 fl (ref 78.0–100.0)
Platelets: 70 10*3/uL — ABNORMAL LOW (ref 150.0–400.0)
RBC: 5.44 Mil/uL — ABNORMAL HIGH (ref 3.87–5.11)
RDW: 17 % — ABNORMAL HIGH (ref 11.5–15.5)
WBC: 10.8 10*3/uL — ABNORMAL HIGH (ref 4.0–10.5)

## 2016-10-22 LAB — POCT URINE PREGNANCY: Preg Test, Ur: NEGATIVE

## 2016-10-22 MED ORDER — PERMETHRIN 1 % EX LOTN: 1.0000 "application " | TOPICAL_LOTION | Freq: Once | CUTANEOUS | 0 refills | Status: AC

## 2016-10-22 NOTE — Progress Notes (Signed)
Pre visit review using our clinic review tool, if applicable. No additional management support is needed unless otherwise documented below in the visit note. 

## 2016-10-22 NOTE — Patient Instructions (Addendum)
Nice to see you. We will treat you for lice. Please get the medication at the pharmacy. Please continue to monitor your blood pressure. We will get you to see gynecology. If you start having excessive bleeding please seek medical attention.

## 2016-10-22 NOTE — Assessment & Plan Note (Signed)
Patient with heavy menstrual cycles recently. Negative pregnancy test today. Benign exam. We'll check a CBC. We'll refer to gynecology to consider further workup. Given return precautions.

## 2016-10-22 NOTE — Assessment & Plan Note (Signed)
Lice noted on exam. We'll treat with permethrin.

## 2016-10-22 NOTE — Assessment & Plan Note (Signed)
Above goal today though had been quite well-controlled and is well controlled at home. Discussed monitoring at home and returning in 2 weeks to see the nurse. If still elevated at that time would consider increasing medication.

## 2016-10-22 NOTE — Progress Notes (Signed)
  [name], MD Phone: [phone]  Carly [name] is a 46 y.o. female who presents today for same-day visit.  Patient notes heavy menstrual periods over the last several months. She notes no intermenstrual bleeding. Notes she has a menstrual cycle once monthly lasting for 5-6 days. 1-2 of those days are heavier than typical and on those days she can go through up to 2 pads per hour. No change in menses associated cramping. No abdominal pain. Came off her menstrual cycle on 10/19/16. She has had a tubal ligation.  Patient notes exposure to lice through her grandson. She does note some scalp itching. Noted this in the last day or 2.  Hypertension: She is taking spironolactone, amlodipine, and metoprolol. Checks at home and it is typically around 130/78. The highest it's been at home is 140/85. She notes no chest pain or shortness of breath.  PMH: Smoker   ROS see history of present illness  Objective  Physical Exam Vitals:   10/22/16 1310 10/22/16 1336  BP: (!) 160/90 (!) 140/94  Pulse: (!) 103   Temp: 98.8 F (37.1 C)     BP Readings from Last 3 Encounters:  10/22/16 (!) 140/94  08/15/16 (!) 150/96  07/27/16 120/90   Wt Readings from Last 3 Encounters:  10/22/16 199 lb (90.3 kg)  08/15/16 201 lb 12.8 oz (91.5 kg)  07/27/16 199 lb (90.3 kg)    Physical Exam  Constitutional: No distress.  Cardiovascular: Normal rate, regular rhythm and normal heart sounds.   Pulmonary/Chest: Effort normal and breath sounds normal.  Abdominal: Soft. Bowel sounds are normal. She exhibits no distension. There is no tenderness. There is no rebound and no guarding.  Genitourinary: Vagina normal, uterus normal, cervix normal, right adnexa normal and left adnexa normal. No vaginal discharge found.  Genitourinary Comments: Normal labia, no lesions noted on cervix, no bleeding noted  Musculoskeletal: She exhibits no edema.  Neurological: She is alert. Gait normal.  Skin: Skin is warm and dry.  She is not diaphoretic.  Live lice noted in scalp     Assessment/Plan: Please see individual problem list.  HTN (hypertension) Above goal today though had been quite well-controlled and is well controlled at home. Discussed monitoring at home and returning in 2 weeks to see the nurse. If still elevated at that time would consider increasing medication.  Menorrhagia with regular cycle Patient with heavy menstrual cycles recently. Negative pregnancy test today. Benign exam. We'll check a CBC. We'll refer to gynecology to consider further workup. Given return precautions.  Lice Lice noted on exam. We'll treat with permethrin.   Orders Placed This Encounter  Procedures  . CBC  . Ambulatory referral to Gynecology    Referral Priority:   Routine    Referral Type:   Consultation    Referral Reason:   Specialty Services Required    Requested Specialty:   Gynecology    Number of Visits Requested:   1  . POCT urine pregnancy    Meds ordered this encounter  Medications  . permethrin (PERMETHRIN LICE TREATMENT) 1 % lotion    Sig: Apply 1 application topically once. Shampoo, rinse and towel dry hair, saturate hair and scalp with permethrin. Rinse after 10 min; repeat in 1 week if needed    Dispense:  59 mL    Refill:  0    [name], MD [place]

## 2016-10-23 ENCOUNTER — Encounter: Payer: Self-pay | Admitting: Family Medicine

## 2016-11-02 ENCOUNTER — Telehealth: Payer: Self-pay | Admitting: *Deleted

## 2016-11-02 NOTE — Telephone Encounter (Signed)
Patient requested a call at [phone]

## 2016-11-02 NOTE — Telephone Encounter (Signed)
Patient notified to make follow up appmt to dr Grayland Ormond

## 2016-11-08 ENCOUNTER — Encounter: Payer: Self-pay | Admitting: Oncology

## 2016-11-09 ENCOUNTER — Inpatient Hospital Stay: Payer: BLUE CROSS/BLUE SHIELD | Attending: Oncology

## 2016-11-09 DIAGNOSIS — D696 Thrombocytopenia, unspecified: Secondary | ICD-10-CM | POA: Diagnosis not present

## 2016-11-09 LAB — CBC WITH DIFFERENTIAL/PLATELET
Basophils Absolute: 0.1 10*3/uL (ref 0–0.1)
Basophils Relative: 1 %
Eosinophils Absolute: 0.2 10*3/uL (ref 0–0.7)
Eosinophils Relative: 5 %
HCT: 38.9 % (ref 35.0–47.0)
Hemoglobin: 12.8 g/dL (ref 12.0–16.0)
Lymphocytes Relative: 28 %
Lymphs Abs: 1.5 10*3/uL (ref 1.0–3.6)
MCH: 25.8 pg — ABNORMAL LOW (ref 26.0–34.0)
MCHC: 33 g/dL (ref 32.0–36.0)
MCV: 78.1 fL — ABNORMAL LOW (ref 80.0–100.0)
Monocytes Absolute: 0.5 10*3/uL (ref 0.2–0.9)
Monocytes Relative: 10 %
Neutro Abs: 3 10*3/uL (ref 1.4–6.5)
Neutrophils Relative %: 56 %
Platelets: 52 10*3/uL — ABNORMAL LOW (ref 150–400)
RBC: 4.98 MIL/uL (ref 3.80–5.20)
RDW: 17.7 % — ABNORMAL HIGH (ref 11.5–14.5)
WBC: 5.4 10*3/uL (ref 3.6–11.0)

## 2016-11-15 DIAGNOSIS — M25539 Pain in unspecified wrist: Secondary | ICD-10-CM | POA: Diagnosis not present

## 2016-11-15 DIAGNOSIS — G5603 Carpal tunnel syndrome, bilateral upper limbs: Secondary | ICD-10-CM | POA: Diagnosis not present

## 2016-11-25 NOTE — Progress Notes (Deleted)
[place]  Telephone:(336) [phone] Fax:(336) [phone]  ID: Carly [name] OB: 1971/04/19  MR#: [id number]  MBT#:[id number]  Patient Care Team: Leone Haven, MD as PCP - General (Family Medicine)  CHIEF COMPLAINT:  Thrombocytopenia  INTERVAL HISTORY: Patient returns to clinic today for repeat laboratory work and further evaluation. She currently feels well and is asymptomatic. She denies any easy bleeding or bruising.  She has no neurologic complaints. She denies any recent fevers. She has a good appetite and denies weight loss. She denies any chest pain or shortness of breath. She denies any nausea, vomiting, constipation, or diarrhea. She has no urinary complaints. Patient offers no specific complaints today.  REVIEW OF SYSTEMS:   Review of Systems  Constitutional: Negative for fever, malaise/fatigue and weight loss.  Respiratory: Negative.  Negative for shortness of breath.   Cardiovascular: Negative.  Negative for chest pain.  Gastrointestinal: Negative.  Negative for abdominal pain, blood in stool and melena.  Genitourinary: Negative.   Musculoskeletal: Negative.   Neurological: Negative for weakness.  Endo/Heme/Allergies: Does not bruise/bleed easily.  Psychiatric/Behavioral: Negative.     As per HPI. Otherwise, a complete review of systems is negatve.  PAST MEDICAL HISTORY: Past Medical History:  Diagnosis Date  . Anxiety   . Chicken pox   . Elevated testosterone level in female   . Emphysema of lung ([place])   . Frequent headaches   . Hay fever   . Hypertension     PAST SURGICAL HISTORY: Past Surgical History:  Procedure Laterality Date  . TUBAL LIGATION  1997  . TUBAL LIGATION      FAMILY HISTORY Family History  Problem Relation Age of Onset  . Hyperlipidemia Mother   . Heart disease Mother   . Stroke Mother   . Depression Mother   . Mental illness Mother   . Heart attack Mother 47  . Hyperlipidemia Father   . Depression Father     . Mental illness Father   . Diabetes Paternal Grandfather   . Cancer Cousin        ADVANCED DIRECTIVES:    HEALTH MAINTENANCE: Social History  Substance Use Topics  . Smoking status: Current Every Day Smoker    Packs/day: 0.25    Years: 28.00    Types: Cigarettes  . Smokeless tobacco: Never Used  . Alcohol use 4.8 oz/week    8 Standard drinks or equivalent per week     Comment: Beer (Can)      Colonoscopy:  PAP:  Bone density:  Lipid panel:  No Known Allergies  Current Outpatient Prescriptions  Medication Sig Dispense Refill  . amLODipine (NORVASC) 10 MG tablet Take 1 tablet (10 mg total) by mouth daily. 90 tablet 3  . budesonide-formoterol (SYMBICORT) 160-4.5 MCG/ACT inhaler Inhale 2 puffs into the lungs 2 (two) times daily. 1 Inhaler 6  . clonazePAM (KLONOPIN) 0.5 MG tablet Take 0.5 tablets (0.25 mg total) by mouth 2 (two) times daily as needed for anxiety. 30 tablet 0  . ibuprofen (ADVIL,MOTRIN) 200 MG tablet Take 400-600 mg by mouth every 6 (six) hours as needed for headache.    . metoprolol succinate (TOPROL-XL) 50 MG 24 hr tablet Take 2 tablets (100 mg total) by mouth daily. Take with or immediately following a meal. 180 tablet 3  . nicotine polacrilex (RA NICOTINE) 2 MG gum Take 1 each (2 mg total) by mouth as needed for smoking cessation. 100 each 0  . sertraline (ZOLOFT) 100 MG tablet Take 1 tablet (  100 mg total) by mouth daily. 90 tablet 3  . spironolactone (ALDACTONE) 25 MG tablet Take 1 tablet (25 mg total) by mouth daily. 30 tablet 3  . vitamin B-12 (CYANOCOBALAMIN) 1000 MCG tablet Take 1,000 mcg by mouth daily.     No current facility-administered medications for this visit.     OBJECTIVE: There were no vitals filed for this visit.   There is no height or weight on file to calculate BMI.    ECOG FS:0 - Asymptomatic  General: Well-developed, well-nourished, no acute distress. Eyes: Pink conjunctiva, anicteric sclera. Lungs: Clear to auscultation  bilaterally. Heart: Regular rate and rhythm. No rubs, murmurs, or gallops. Abdomen: Soft, nontender, nondistended. No organomegaly noted, normoactive bowel sounds. Musculoskeletal: No edema, cyanosis, or clubbing. Neuro: Alert, answering all questions appropriately. Cranial nerves grossly intact. Skin: No rashes or petechiae noted. Psych: Normal affect.   LAB RESULTS:  Lab Results  Component Value Date   NA 139 08/15/2016   K 4.0 08/15/2016   CL 106 08/15/2016   CO2 27 08/15/2016   GLUCOSE 141 (H) 08/15/2016   BUN 7 08/15/2016   CREATININE 0.78 08/15/2016   CALCIUM 9.2 08/15/2016   PROT 6.5 06/18/2016   ALBUMIN 4.1 06/18/2016   AST 19 06/18/2016   ALT 16 06/18/2016   ALKPHOS 73 06/18/2016   BILITOT 0.7 06/18/2016   GFRNONAA 96 06/18/2016   GFRAA 110 06/18/2016    Lab Results  Component Value Date   WBC 5.4 11/09/2016   NEUTROABS 3.0 11/09/2016   HGB 12.8 11/09/2016   HCT 38.9 11/09/2016   MCV 78.1 (L) 11/09/2016   PLT 52 (L) 11/09/2016    STUDIES: No results found.  ASSESSMENT: Thrombocytopenia, possibly ITP.  PLAN:    1. Thrombocytopenia: Patient platelet count is mildly decreased, but platelet clumping prevented an accurate result. Previously, all of her laboratory work including iron stores, hemolysis labs, antiplatelet antibodies, and ANA are either negative or within normal limits. Abdominal ultrasound did not reveal splenomegaly. Return to clinic in 1 month with repeat laboratory work and then in 2 months with repeat laboratory work and further evaluation.  2. Hypertension: Blood pressure Mildly elevated today, continue current medications.  Patient expressed understanding and was in agreement with this plan. She also understands that She can call clinic at any time with any questions, concerns, or complaints.    Lloyd Huger, MD   11/25/2016 10:23 PM

## 2016-11-27 ENCOUNTER — Inpatient Hospital Stay: Payer: BLUE CROSS/BLUE SHIELD | Admitting: Oncology

## 2016-11-29 ENCOUNTER — Ambulatory Visit: Payer: Self-pay | Admitting: Obstetrics and Gynecology

## 2016-12-26 NOTE — Progress Notes (Deleted)
[facility]  [phone]) [phone] [phone]) [phone]  ID: Carly [name] OB: 29-Jan-1971  MR#: [id number]  AOZ#:[id number]  Patient Care Team: Leone Haven, MD as PCP - General (Family Medicine)  CHIEF COMPLAINT:  Thrombocytopenia  INTERVAL HISTORY: Patient returns to clinic today for repeat laboratory work and further evaluation. She currently feels well and is asymptomatic. She denies any easy bleeding or bruising.  She has no neurologic complaints. She denies any recent fevers. She has a good appetite and denies weight loss. She denies any chest pain or shortness of breath. She denies any nausea, vomiting, constipation, or diarrhea. She has no urinary complaints. Patient offers no specific complaints today.  REVIEW OF SYSTEMS:   Review of Systems  Constitutional: Negative for fever, malaise/fatigue and weight loss.  Respiratory: Negative.  Negative for shortness of breath.   Cardiovascular: Negative.  Negative for chest pain.  Gastrointestinal: Negative.  Negative for abdominal pain, blood in stool and melena.  Genitourinary: Negative.   Musculoskeletal: Negative.   Neurological: Negative for weakness.  Endo/Heme/Allergies: Does not bruise/bleed easily.  Psychiatric/Behavioral: Negative.     As per HPI. Otherwise, a complete review of systems is negatve.  PAST MEDICAL HISTORY: Past Medical History:  Diagnosis Date  . Anxiety   . Chicken pox   . Elevated testosterone level in female   . Emphysema of lung ([place])   . Frequent headaches   . Hay fever   . Hypertension     PAST SURGICAL HISTORY: Past Surgical History:  Procedure Laterality Date  . TUBAL LIGATION  1997  . TUBAL LIGATION      FAMILY HISTORY Family History  Problem Relation Age of Onset  . Hyperlipidemia Mother   . Heart disease Mother   . Stroke Mother   . Depression Mother   . Mental illness Mother   . Heart attack Mother 61  . Hyperlipidemia Father   . Depression Father     . Mental illness Father   . Diabetes Paternal Grandfather   . Cancer Cousin        ADVANCED DIRECTIVES:    HEALTH MAINTENANCE: Social History  Substance Use Topics  . Smoking status: Current Every Day Smoker    Packs/day: 0.25    Years: 28.00    Types: Cigarettes  . Smokeless tobacco: Never Used  . Alcohol use 4.8 oz/week    8 Standard drinks or equivalent per week     Comment: Beer (Can)      Colonoscopy:  PAP:  Bone density:  Lipid panel:  No Known Allergies  Current Outpatient Prescriptions  Medication Sig Dispense Refill  . amLODipine (NORVASC) 10 MG tablet Take 1 tablet (10 mg total) by mouth daily. 90 tablet 3  . budesonide-formoterol (SYMBICORT) 160-4.5 MCG/ACT inhaler Inhale 2 puffs into the lungs 2 (two) times daily. 1 Inhaler 6  . clonazePAM (KLONOPIN) 0.5 MG tablet Take 0.5 tablets (0.25 mg total) by mouth 2 (two) times daily as needed for anxiety. 30 tablet 0  . ibuprofen (ADVIL,MOTRIN) 200 MG tablet Take 400-600 mg by mouth every 6 (six) hours as needed for headache.    . metoprolol succinate (TOPROL-XL) 50 MG 24 hr tablet Take 2 tablets (100 mg total) by mouth daily. Take with or immediately following a meal. 180 tablet 3  . nicotine polacrilex (RA NICOTINE) 2 MG gum Take 1 each (2 mg total) by mouth as needed for smoking cessation. 100 each 0  . sertraline (ZOLOFT) 100 MG tablet Take 1 tablet (  100 mg total) by mouth daily. 90 tablet 3  . spironolactone (ALDACTONE) 25 MG tablet Take 1 tablet (25 mg total) by mouth daily. 30 tablet 3  . vitamin B-12 (CYANOCOBALAMIN) 1000 MCG tablet Take 1,000 mcg by mouth daily.     No current facility-administered medications for this visit.     OBJECTIVE: There were no vitals filed for this visit.   There is no height or weight on file to calculate BMI.    ECOG FS:0 - Asymptomatic  General: Well-developed, well-nourished, no acute distress. Eyes: Pink conjunctiva, anicteric sclera. Lungs: Clear to auscultation  bilaterally. Heart: Regular rate and rhythm. No rubs, murmurs, or gallops. Abdomen: Soft, nontender, nondistended. No organomegaly noted, normoactive bowel sounds. Musculoskeletal: No edema, cyanosis, or clubbing. Neuro: Alert, answering all questions appropriately. Cranial nerves grossly intact. Skin: No rashes or petechiae noted. Psych: Normal affect.   LAB RESULTS:  Lab Results  Component Value Date   NA 139 08/15/2016   K 4.0 08/15/2016   CL 106 08/15/2016   CO2 27 08/15/2016   GLUCOSE 141 (H) 08/15/2016   BUN 7 08/15/2016   CREATININE 0.78 08/15/2016   CALCIUM 9.2 08/15/2016   PROT 6.5 06/18/2016   ALBUMIN 4.1 06/18/2016   AST 19 06/18/2016   ALT 16 06/18/2016   ALKPHOS 73 06/18/2016   BILITOT 0.7 06/18/2016   GFRNONAA 96 06/18/2016   GFRAA 110 06/18/2016    Lab Results  Component Value Date   WBC 5.4 11/09/2016   NEUTROABS 3.0 11/09/2016   HGB 12.8 11/09/2016   HCT 38.9 11/09/2016   MCV 78.1 (L) 11/09/2016   PLT 52 (L) 11/09/2016    STUDIES: No results found.  ASSESSMENT: Thrombocytopenia, possibly ITP.  PLAN:    1. Thrombocytopenia: Patient platelet count is mildly decreased, but platelet clumping prevented an accurate result. Previously, all of her laboratory work including iron stores, hemolysis labs, antiplatelet antibodies, and ANA are either negative or within normal limits. Abdominal ultrasound did not reveal splenomegaly. Return to clinic in 1 month with repeat laboratory work and then in 2 months with repeat laboratory work and further evaluation.  2. Hypertension: Blood pressure Mildly elevated today, continue current medications.  Patient expressed understanding and was in agreement with this plan. She also understands that She can call clinic at any time with any questions, concerns, or complaints.    Lloyd Huger, MD   12/26/2016 11:48 PM

## 2016-12-27 ENCOUNTER — Inpatient Hospital Stay: Payer: BLUE CROSS/BLUE SHIELD | Admitting: Oncology

## 2017-02-05 DIAGNOSIS — G5603 Carpal tunnel syndrome, bilateral upper limbs: Secondary | ICD-10-CM | POA: Diagnosis not present

## 2017-02-20 ENCOUNTER — Encounter: Payer: Self-pay | Admitting: Family Medicine

## 2017-02-26 DIAGNOSIS — G5603 Carpal tunnel syndrome, bilateral upper limbs: Secondary | ICD-10-CM | POA: Diagnosis not present

## 2017-03-04 ENCOUNTER — Emergency Department: Payer: BLUE CROSS/BLUE SHIELD

## 2017-03-04 ENCOUNTER — Emergency Department
Admission: EM | Admit: 2017-03-04 | Discharge: 2017-03-04 | Disposition: A | Discharge status: Home / Self Care | Payer: BLUE CROSS/BLUE SHIELD | Attending: Emergency Medicine | Admitting: Emergency Medicine

## 2017-03-04 DIAGNOSIS — I10 Essential (primary) hypertension: Secondary | ICD-10-CM | POA: Diagnosis not present

## 2017-03-04 DIAGNOSIS — A0811 Acute gastroenteropathy due to Norwalk agent: Secondary | ICD-10-CM

## 2017-03-04 DIAGNOSIS — F17210 Nicotine dependence, cigarettes, uncomplicated: Secondary | ICD-10-CM | POA: Diagnosis not present

## 2017-03-04 DIAGNOSIS — K529 Noninfective gastroenteritis and colitis, unspecified: Secondary | ICD-10-CM | POA: Diagnosis not present

## 2017-03-04 DIAGNOSIS — R112 Nausea with vomiting, unspecified: Secondary | ICD-10-CM | POA: Diagnosis not present

## 2017-03-04 DIAGNOSIS — J449 Chronic obstructive pulmonary disease, unspecified: Secondary | ICD-10-CM | POA: Diagnosis not present

## 2017-03-04 DIAGNOSIS — K760 Fatty (change of) liver, not elsewhere classified: Secondary | ICD-10-CM | POA: Diagnosis not present

## 2017-03-04 DIAGNOSIS — K5289 Other specified noninfective gastroenteritis and colitis: Secondary | ICD-10-CM | POA: Insufficient documentation

## 2017-03-04 LAB — GASTROINTESTINAL PANEL BY PCR, STOOL (REPLACES STOOL CULTURE)

## 2017-03-04 LAB — URINALYSIS, COMPLETE (UACMP) WITH MICROSCOPIC
Bilirubin Urine: NEGATIVE
Glucose, UA: NEGATIVE mg/dL
Ketones, ur: NEGATIVE mg/dL
Leukocytes, UA: NEGATIVE
Nitrite: NEGATIVE
Protein, ur: 30 mg/dL — AB
Specific Gravity, Urine: 1.02 (ref 1.005–1.030)
pH: 5 (ref 5.0–8.0)

## 2017-03-04 LAB — COMPREHENSIVE METABOLIC PANEL
ALT: 22 U/L (ref 14–54)
AST: 33 U/L (ref 15–41)
Albumin: 4.4 g/dL (ref 3.5–5.0)
Alkaline Phosphatase: 79 U/L (ref 38–126)
Anion gap: 11 (ref 5–15)
BUN: 11 mg/dL (ref 6–20)
CO2: 19 mmol/L — ABNORMAL LOW (ref 22–32)
Calcium: 8.9 mg/dL (ref 8.9–10.3)
Chloride: 108 mmol/L (ref 101–111)
Creatinine, Ser: 0.88 mg/dL (ref 0.44–1.00)
GFR calc Af Amer: >60 mL/min (ref >60)
GFR calc non Af Amer: >60 mL/min (ref >60)
Glucose, Bld: 132 mg/dL — ABNORMAL HIGH (ref 65–99)
Potassium: 3.7 mmol/L (ref 3.5–5.1)
Sodium: 138 mmol/L (ref 135–145)
Total Bilirubin: 0.9 mg/dL (ref 0.3–1.2)
Total Protein: 7.5 g/dL (ref 6.5–8.1)

## 2017-03-04 LAB — CBC
HCT: 46.5 % (ref 35.0–47.0)
Hemoglobin: 15 g/dL (ref 12.0–16.0)
MCH: 25.7 pg — ABNORMAL LOW (ref 26.0–34.0)
MCHC: 32.2 g/dL (ref 32.0–36.0)
MCV: 79.8 fL — ABNORMAL LOW (ref 80.0–100.0)
Platelets: 61 10*3/uL — ABNORMAL LOW (ref 150–440)
RBC: 5.83 MIL/uL — ABNORMAL HIGH (ref 3.80–5.20)
RDW: 16.6 % — ABNORMAL HIGH (ref 11.5–14.5)
WBC: 14.6 10*3/uL — ABNORMAL HIGH (ref 3.6–11.0)

## 2017-03-04 LAB — C DIFFICILE QUICK SCREEN W PCR REFLEX
C Diff antigen: NEGATIVE
C Diff interpretation: NOT DETECTED
C Diff toxin: NEGATIVE

## 2017-03-04 LAB — LIPASE, BLOOD: Lipase: 23 U/L (ref 11–51)

## 2017-03-04 LAB — PREGNANCY, URINE: Preg Test, Ur: NEGATIVE

## 2017-03-04 MED ORDER — MORPHINE SULFATE (PF) 4 MG/ML IV SOLN
4.0000 mg | Freq: Once | INTRAVENOUS | Status: AC
  Administered 2017-03-04: 4 mg via INTRAVENOUS

## 2017-03-04 MED ORDER — ONDANSETRON HCL 4 MG/2ML IJ SOLN
4.0000 mg | Freq: Once | INTRAMUSCULAR | Status: AC
  Administered 2017-03-04: 4 mg via INTRAVENOUS
  Filled 2017-03-04: qty 2

## 2017-03-04 MED ORDER — ONDANSETRON 4 MG PO TBDP: 4.0000 mg | ORAL_TABLET | Freq: Three times a day (TID) | ORAL | 0 refills | Status: DC | PRN

## 2017-03-04 MED ORDER — LOPERAMIDE HCL 2 MG PO TABS: 2.0000 mg | ORAL_TABLET | Freq: Four times a day (QID) | ORAL | 0 refills | Status: DC | PRN

## 2017-03-04 MED ORDER — OXYCODONE-ACETAMINOPHEN 5-325 MG PO TABS: 1.0000 | ORAL_TABLET | Freq: Four times a day (QID) | ORAL | 0 refills | Status: DC | PRN

## 2017-03-04 MED ORDER — MORPHINE SULFATE (PF) 4 MG/ML IV SOLN
INTRAVENOUS | Status: DC
Start: 2017-03-04 — End: 2017-03-04
  Filled 2017-03-04: qty 1

## 2017-03-04 MED ORDER — DICYCLOMINE HCL 20 MG PO TABS: 20.0000 mg | ORAL_TABLET | Freq: Three times a day (TID) | ORAL | 0 refills | Status: DC | PRN

## 2017-03-04 MED ORDER — SODIUM CHLORIDE 0.9 % IV SOLN
Freq: Once | INTRAVENOUS | Status: AC
  Administered 2017-03-04: 08:00:00 via INTRAVENOUS

## 2017-03-04 MED ORDER — MORPHINE SULFATE (PF) 4 MG/ML IV SOLN
4.0000 mg | Freq: Once | INTRAVENOUS | Status: AC
  Administered 2017-03-04: 4 mg via INTRAVENOUS
  Filled 2017-03-04: qty 1

## 2017-03-04 MED ORDER — MORPHINE SULFATE (PF) 4 MG/ML IV SOLN
INTRAVENOUS | Status: AC
  Filled 2017-03-04: qty 1

## 2017-03-04 NOTE — ED Notes (Signed)
Lab called concerning processing stool sample. States they have the sample and will look into processing orders

## 2017-03-04 NOTE — ED Notes (Signed)
Pt pacing in room, asking for pain medicine, family at bedside

## 2017-03-04 NOTE — ED Notes (Signed)
Pt resting in bed, resp even and unlabored, family at bedside 

## 2017-03-04 NOTE — ED Notes (Signed)
PT states she has stomach pain. Feels "contractions"every 5 to 10 minutes. Started 1:00am. Also has diarrhea.

## 2017-03-04 NOTE — ED Provider Notes (Signed)
[facility] Emergency Department Provider Note       Time seen: ----------------------------------------- 7:55 AM on 03/04/2017 -----------------------------------------     I have reviewed the triage vital signs and the nursing notes.   HISTORY   Chief Complaint Abdominal Pain    HPI Carly [name] is a 46 y.o. female who presents to the ED for acute onset of nausea, vomiting and diarrhea with abdominal cramping that is diffuse. Symptom onset was around 1 AM. Patient reports 5 years ago she was diagnosed with colitis and had an episode similar to this. She states she was hospitalized at that time at this hospital. She reports no recent antibiotic use, feels hot and cold but denies any specific fevers. Pain is 10 over 10 diffusely in the abdomen.  Past Medical History:  Diagnosis Date  . Anxiety   . Chicken pox   . Elevated testosterone level in female   . Emphysema of lung ([place])   . Frequent headaches   . Hay fever   . Hypertension     Patient Active Problem List   Diagnosis Date Noted  . Menorrhagia with regular cycle 10/22/2016  . Lice 30/01/6225  . Insomnia 08/15/2016  . Nocturia 06/18/2016  . Thrombocytopenia ([place]) 02/12/2016  . COPD exacerbation ([place]) 01/31/2016  . Carpal tunnel syndrome 01/20/2016  . Lateral epicondylitis 01/20/2016  . Obesity 01/04/2016  . Palpitations 12/15/2015  . Myalgia 10/31/2015  . Other fatigue 10/31/2015  . Mixed incontinence 03/09/2015  . COPD (chronic obstructive pulmonary disease) ([place]) 01/13/2015  . Acute bronchitis 11/15/2014  . Female hirsutism 09/22/2014  . Elevated testosterone level in female 09/22/2014  . Routine general medical examination at a health care facility 09/10/2014  . Generalized anxiety disorder 08/23/2014  . Tobacco use disorder 08/23/2014  . Obesity (BMI 30-39.9) 08/23/2014  . HTN (hypertension) 08/23/2014    Past Surgical History:  Procedure Laterality Date  . TUBAL  LIGATION  1997  . TUBAL LIGATION      Allergies Patient has no known allergies.  Social History Social History  Substance Use Topics  . Smoking status: Current Every Day Smoker    Packs/day: 0.25    Years: 28.00    Types: Cigarettes  . Smokeless tobacco: Never Used  . Alcohol use 4.8 oz/week    8 Standard drinks or equivalent per week     Comment: Beer (Can)     Review of Systems Constitutional: Negative for fever. Eyes: Negative for vision changes ENT:  Negative for congestion, sore throat Cardiovascular: Negative for chest pain. Respiratory: Negative for shortness of breath. Gastrointestinal: Positive for abdominal pain, vomiting and diarrhea Genitourinary: Negative for dysuria. Musculoskeletal: Negative for back pain. Skin: Negative for rash. Neurological: Negative for headaches, focal weakness or numbness.  All systems negative/normal/unremarkable except as stated in the HPI  ____________________________________________   PHYSICAL EXAM:  VITAL SIGNS: ED Triage Vitals  Enc Vitals Group     BP 03/04/17 0615 (!) 135/100     Pulse Rate 03/04/17 0615 (!) 123     Resp 03/04/17 0615 (!) 22     Temp --      Temp Source 03/04/17 0615 Oral     SpO2 03/04/17 0615 95 %     Weight 03/04/17 0616 205 lb (93 kg)     Height 03/04/17 0616 5\' 4"  (1.626 m)     Head Circumference --      Peak Flow --      Pain Score 03/04/17 0616 10  Pain Loc --      Pain Edu? --      Excl. in [place]? --     Constitutional: Alert and oriented. Well appearing and in no distress. Eyes: Conjunctivae are normal. Normal extraocular movements. ENT   Head: Normocephalic and atraumatic.   Nose: No congestion/rhinnorhea.   Mouth/Throat: Mucous membranes are moist.   Neck: No stridor. Cardiovascular:Rapid rate, regular rhythm. No murmurs, rubs, or gallops. Respiratory: Normal respiratory effort without tachypnea nor retractions. Breath sounds are clear and equal bilaterally. No  wheezes/rales/rhonchi. Gastrointestinal: Diffuse abdominal tenderness, no rebound or guarding. Normal bowel sounds. Musculoskeletal: Nontender with normal range of motion in extremities. No lower extremity tenderness nor edema. Neurologic:  Normal speech and language. No gross focal neurologic deficits are appreciated.  Skin:  Skin is warm, dry and intact. No rash noted. Psychiatric: Mood and affect are normal. Speech and behavior are normal.  ____________________________________________  ED COURSE:  Pertinent labs & imaging results that were available during my care of the patient were reviewed by me and considered in my medical decision making (see chart for details). Patient presents for symptoms of gastroenteritis or colitis, we will assess with labs and imaging as indicated. Clinical Course as of Mar 04 1204  Mon Mar 04, 2017  1205 C Diff antigen: NEGATIVE [JW]  1205 Norovirus GI/GII: (!) DETECTED [JW]    Clinical Course User Index [JW] Earleen Newport, MD   Procedures ____________________________________________   LABS (pertinent positives/negatives)  Labs Reviewed  GASTROINTESTINAL PANEL BY PCR, STOOL (REPLACES STOOL CULTURE) - Abnormal; Notable for the following:       Result Value   Norovirus GI/GII DETECTED (*)    All other components within normal limits  COMPREHENSIVE METABOLIC PANEL - Abnormal; Notable for the following:    CO2 19 (*)    Glucose, Bld 132 (*)    All other components within normal limits  CBC - Abnormal; Notable for the following:    WBC 14.6 (*)    RBC 5.83 (*)    MCV 79.8 (*)    MCH 25.7 (*)    RDW 16.6 (*)    Platelets 61 (*)    All other components within normal limits  URINALYSIS, COMPLETE (UACMP) WITH MICROSCOPIC - Abnormal; Notable for the following:    Color, Urine AMBER (*)    APPearance CLOUDY (*)    Hgb urine dipstick LARGE (*)    Protein, ur 30 (*)    Bacteria, UA RARE (*)    Squamous Epithelial / LPF 6-30 (*)    All other  components within normal limits  C DIFFICILE QUICK SCREEN W PCR REFLEX  LIPASE, BLOOD  PREGNANCY, URINE    RADIOLOGY Images were viewed by me  CT renal protocol  IMPRESSION: 1. Mild fat inflammation associated with small bowel loops, suspect enteritis. 2. No hydronephrosis or ureteral stone. 3. Hepatic steatosis, improved compared to 2011 ____________________________________________  FINAL ASSESSMENT AND PLAN  Gastroenteritis  Plan: Patient's labs and imaging were dictated above. Patient had presented for symptoms of gastroenteritis confirmed with stool studies revealing positive Norovirus infection. She'll be discharged with antiemetics, probiotics and antidiarrheal agents. She is stable for outpatient follow-up.   Earleen Newport, MD   Note: This note was generated in part or whole with voice recognition software. Voice recognition is usually quite accurate but there are transcription errors that can and very often do occur. I apologize for any typographical errors that were not detected and corrected.  Earleen Newport, MD 03/04/17 [phone]

## 2017-03-04 NOTE — ED Triage Notes (Signed)
Reports symptoms began at 1 am.  Reports nausea, vomiting diarrhea and abdominal cramping.  Reports similar episode several years ago and diagnosed with colitis.

## 2017-03-07 ENCOUNTER — Encounter: Payer: Self-pay | Admitting: Family Medicine

## 2017-03-11 ENCOUNTER — Ambulatory Visit: Payer: BLUE CROSS/BLUE SHIELD | Admitting: Family Medicine

## 2017-03-11 ENCOUNTER — Telehealth: Payer: Self-pay | Admitting: Family Medicine

## 2017-03-11 NOTE — Telephone Encounter (Signed)
Discussed with Juliann Pulse. She's going to remove her from the schedule.

## 2017-03-11 NOTE — Telephone Encounter (Signed)
fyi

## 2017-03-11 NOTE — Telephone Encounter (Signed)
FYI - Pt called and left a voicemail cancelling her appt for today.

## 2017-04-25 DIAGNOSIS — J441 Chronic obstructive pulmonary disease with (acute) exacerbation: Secondary | ICD-10-CM | POA: Diagnosis not present

## 2017-05-23 DIAGNOSIS — G5603 Carpal tunnel syndrome, bilateral upper limbs: Secondary | ICD-10-CM | POA: Diagnosis not present

## 2017-06-05 DIAGNOSIS — G5603 Carpal tunnel syndrome, bilateral upper limbs: Secondary | ICD-10-CM | POA: Diagnosis not present

## 2017-06-12 DIAGNOSIS — G5603 Carpal tunnel syndrome, bilateral upper limbs: Secondary | ICD-10-CM | POA: Diagnosis not present

## 2017-06-13 ENCOUNTER — Telehealth: Payer: Self-pay | Admitting: Family Medicine

## 2017-06-13 NOTE — Telephone Encounter (Signed)
Sent pt MyChart message to schedule surgical clearance appt.

## 2017-07-08 ENCOUNTER — Ambulatory Visit: Payer: BLUE CROSS/BLUE SHIELD | Admitting: Family Medicine

## 2017-07-23 ENCOUNTER — Ambulatory Visit: Payer: BLUE CROSS/BLUE SHIELD | Admitting: Family Medicine

## 2017-08-07 ENCOUNTER — Other Ambulatory Visit: Payer: Self-pay | Admitting: Family Medicine

## 2017-08-07 DIAGNOSIS — J441 Chronic obstructive pulmonary disease with (acute) exacerbation: Secondary | ICD-10-CM

## 2017-08-08 ENCOUNTER — Other Ambulatory Visit: Payer: Self-pay | Admitting: Family Medicine

## 2017-08-08 DIAGNOSIS — J441 Chronic obstructive pulmonary disease with (acute) exacerbation: Secondary | ICD-10-CM

## 2017-08-08 MED ORDER — BUDESONIDE-FORMOTEROL FUMARATE 160-4.5 MCG/ACT IN AERO: 2.0000 | INHALATION_SPRAY | Freq: Two times a day (BID) | RESPIRATORY_TRACT | 6 refills | Status: DC

## 2017-08-08 NOTE — Addendum Note (Signed)
Addended by: Leone Haven on: 08/08/2017 03:02 PM   Modules accepted: Orders

## 2017-08-08 NOTE — Telephone Encounter (Signed)
Sent to pharmacy 

## 2017-08-08 NOTE — Telephone Encounter (Signed)
Please advise 

## 2017-08-08 NOTE — Telephone Encounter (Signed)
Carly [name] ordered pt.'s Symbicort. Requesting a refill. Has appointment 09/02/17. Thanks.

## 2017-08-08 NOTE — Telephone Encounter (Signed)
Copied from [place] [id number]. Topic: Quick Communication - See Telephone Encounter >> Aug 08, 2017 11:03 AM Bea Graff, NT wrote: CRM for notification. See Telephone encounter for: Pt needing a refill of her  budesonide-formoterol (SYMBICORT). She has an appt on 09/02/17 but does not have enough to last her to that appt. Uses [facility] on [address].   08/08/17.

## 2017-08-08 NOTE — Telephone Encounter (Signed)
Last OV 10/22/16 last filled by Clarene Reamer FNP, 07/27/16 1 6rf

## 2017-08-29 ENCOUNTER — Encounter: Payer: Self-pay | Admitting: Internal Medicine

## 2017-08-29 ENCOUNTER — Ambulatory Visit (INDEPENDENT_AMBULATORY_CARE_PROVIDER_SITE_OTHER): Payer: BLUE CROSS/BLUE SHIELD | Admitting: Internal Medicine

## 2017-08-29 VITALS — BP 146/80 | HR 107 | Temp 98.6°F | Ht 64.0 in | Wt 202.0 lb

## 2017-08-29 DIAGNOSIS — J329 Chronic sinusitis, unspecified: Secondary | ICD-10-CM | POA: Diagnosis not present

## 2017-08-29 MED ORDER — AMOXICILLIN-POT CLAVULANATE 875-125 MG PO TABS
1.0000 | ORAL_TABLET | Freq: Two times a day (BID) | ORAL | 0 refills | Status: DC
Start: 2017-08-29 — End: 2018-02-06

## 2017-08-29 MED ORDER — PREDNISONE 20 MG PO TABS
40.0000 mg | ORAL_TABLET | Freq: Every day | ORAL | 0 refills | Status: DC
Start: 2017-08-29 — End: 2018-02-06

## 2017-08-29 NOTE — Progress Notes (Signed)
Chief Complaint  Patient presents with  . Sinusitis    left worse   Sick visit  C/o left frontal, ethmoid and maxillary sinus pressure since Sunday tried tylenol and ibuprofen w/o relief tried NS and Nasacort OTC w/o relief. C/o left sided h/a 9/10 todayand photophobia and pressure. No cough, no stuffy nose or runny nose. She reports 1.5 weeks ago she was sick with cold. She also reports dizziness worse with blowing nose   HTN elevated today not had BP medications yet this am    Review of Systems  Constitutional: Negative for weight loss.  HENT: Positive for sinus pain.   Eyes: Negative for blurred vision.  Respiratory: Negative for cough.   Cardiovascular: Negative for chest pain.  Skin: Negative for rash.  Neurological: Positive for dizziness and headaches. Negative for focal weakness.   Past Medical History:  Diagnosis Date  . Anxiety   . Chicken pox   . Elevated testosterone level in female   . Emphysema of lung ([place])   . Frequent headaches   . Hay fever   . Hypertension    Past Surgical History:  Procedure Laterality Date  . TUBAL LIGATION  1997  . TUBAL LIGATION     Family History  Problem Relation Age of Onset  . Hyperlipidemia Mother   . Heart disease Mother   . Stroke Mother   . Depression Mother   . Mental illness Mother   . Heart attack Mother 45  . Hyperlipidemia Father   . Depression Father   . Mental illness Father   . Diabetes Paternal Grandfather   . Cancer Cousin    Social History   Socioeconomic History  . Marital status: Married    Spouse name: Not on file  . Number of children: Not on file  . Years of education: Not on file  . Highest education level: Not on file  Social Needs  . Financial resource strain: Not on file  . Food insecurity - worry: Not on file  . Food insecurity - inability: Not on file  . Transportation needs - medical: Not on file  . Transportation needs - non-medical: Not on file  Occupational History  . Not on file   Tobacco Use  . Smoking status: Current Every Day Smoker    Packs/day: 0.25    Years: 28.00    Pack years: 7.00    Types: Cigarettes  . Smokeless tobacco: Never Used  Substance and Sexual Activity  . Alcohol use: Yes    Alcohol/week: 4.8 oz    Types: 8 Standard drinks or equivalent per week    Comment: Beer (Can)   . Drug use: No  . Sexual activity: Yes    Partners: Male  Other Topics Concern  . Not on file  Social History Narrative   57 Year college    Works in a [employer]- [employer] drug [employer]   Married to Nina    Enjoys coloring    Pets: Dog lives inside   Children: 57 Son 72 Son   Current Meds  Medication Sig  . amLODipine (NORVASC) 10 MG tablet Take 1 tablet (10 mg total) by mouth daily.  . budesonide-formoterol (SYMBICORT) 160-4.5 MCG/ACT inhaler Inhale 2 puffs into the lungs 2 (two) times daily.  . cholecalciferol (VITAMIN D) 1000 units tablet Take 1,000 Units by mouth daily.  . clonazePAM (KLONOPIN) 0.5 MG tablet Take 0.5 tablets (0.25 mg total) by mouth 2 (two) times daily as needed for anxiety.  . dicyclomine (  BENTYL) 20 MG tablet Take 1 tablet (20 mg total) by mouth 3 (three) times daily as needed for spasms.  Marland Kitchen ibuprofen (ADVIL,MOTRIN) 200 MG tablet Take 400-600 mg by mouth every 6 (six) hours as needed for headache.  . loperamide (IMODIUM A-D) 2 MG tablet Take 1 tablet (2 mg total) by mouth 4 (four) times daily as needed for diarrhea or loose stools.  . meloxicam (MOBIC) 15 MG tablet Take 15 mg by mouth daily.  . metoprolol succinate (TOPROL-XL) 50 MG 24 hr tablet Take 2 tablets (100 mg total) by mouth daily. Take with or immediately following a meal.  . nicotine polacrilex (RA NICOTINE) 2 MG gum Take 1 each (2 mg total) by mouth as needed for smoking cessation.  . ondansetron (ZOFRAN ODT) 4 MG disintegrating tablet Take 1 tablet (4 mg total) by mouth every 8 (eight) hours as needed for nausea or vomiting.  Marland Kitchen oxyCODONE-acetaminophen (PERCOCET) 5-325 MG tablet  Take 1-2 tablets by mouth every 6 (six) hours as needed.  . sertraline (ZOLOFT) 100 MG tablet Take 1 tablet (100 mg total) by mouth daily.  Marland Kitchen spironolactone (ALDACTONE) 25 MG tablet Take 1 tablet (25 mg total) by mouth daily.  . vitamin B-12 (CYANOCOBALAMIN) 1000 MCG tablet Take 1,000 mcg by mouth daily.   Allergies  Allergen Reactions  . Metoprolol Tartrate   . Spironolactone    No results found for this or any previous visit (from the past 2160 hour(s)). Objective  Body mass index is 34.67 kg/m. Wt Readings from Last 3 Encounters:  08/29/17 202 lb (91.6 kg)  03/04/17 205 lb (93 kg)  10/22/16 199 lb (90.3 kg)   Temp Readings from Last 3 Encounters:  08/29/17 98.6 F (37 C) (Oral)  10/22/16 98.8 F (37.1 C) (Oral)   BP Readings from Last 3 Encounters:  08/29/17 (!) 146/80  03/04/17 (!) 157/99  10/22/16 (!) 140/94   Pulse Readings from Last 3 Encounters:  08/29/17 (!) 107  03/04/17 (!) 123  10/22/16 (!) 103   O2 sat room air 97% Physical Exam  Constitutional: She is oriented to person, place, and time and well-developed, well-nourished, and in no distress.  HENT:  Head: Normocephalic and atraumatic.  Mouth/Throat: Oropharynx is clear and moist and mucous membranes are normal.  Eyes: Conjunctivae are normal. Pupils are equal, round, and reactive to light.  Cardiovascular: Regular rhythm and normal heart sounds. Tachycardia present.  Pulmonary/Chest: Effort normal and breath sounds normal.  Neurological: She is alert and oriented to person, place, and time. Gait normal. Gait normal.  Skin: Skin is warm, dry and intact.  Psychiatric: Mood, memory, affect and judgment normal.  Nursing note and vitals reviewed.   Assessment   1. Sinusitis  2. HTN  Plan  1. Augmentin, Prednisone x 1 week  Call or RTC if not better  Prn Tylenol/Advil supportive care  2. rec take meds for today has not had today  Provider: Dr. Olivia Mackie McLean-Scocuzza-Internal Medicine

## 2017-08-29 NOTE — Progress Notes (Signed)
Pre visit review using our clinic review tool, if applicable. No additional management support is needed unless otherwise documented below in the visit note. 

## 2017-08-29 NOTE — Patient Instructions (Signed)
Call back in 1 week or less if not better   Sinusitis, Adult Sinusitis is soreness and inflammation of your sinuses. Sinuses are hollow spaces in the bones around your face. Your sinuses are located:  Around your eyes.  In the middle of your forehead.  Behind your nose.  In your cheekbones.  Your sinuses and nasal passages are lined with a stringy fluid (mucus). Mucus normally drains out of your sinuses. When your nasal tissues become inflamed or swollen, the mucus can become trapped or blocked so air cannot flow through your sinuses. This allows bacteria, viruses, and funguses to grow, which leads to infection. Sinusitis can develop quickly and last for 7?10 days (acute) or for more than 12 weeks (chronic). Sinusitis often develops after a cold. What are the causes? This condition is caused by anything that creates swelling in the sinuses or stops mucus from draining, including:  Allergies.  Asthma.  Bacterial or viral infection.  Abnormally shaped bones between the nasal passages.  Nasal growths that contain mucus (nasal polyps).  Narrow sinus openings.  Pollutants, such as chemicals or irritants in the air.  A foreign object stuck in the nose.  A fungal infection. This is rare.  What increases the risk? The following factors may make you more likely to develop this condition:  Having allergies or asthma.  Having had a recent cold or respiratory tract infection.  Having structural deformities or blockages in your nose or sinuses.  Having a weak immune system.  Doing a lot of swimming or diving.  Overusing nasal sprays.  Smoking.  What are the signs or symptoms? The main symptoms of this condition are pain and a feeling of pressure around the affected sinuses. Other symptoms include:  Upper toothache.  Earache.  Headache.  Bad breath.  Decreased sense of smell and taste.  A cough that may get worse at night.  Fatigue.  Fever.  Thick drainage  from your nose. The drainage is often green and it may contain pus (purulent).  Stuffy nose or congestion.  Postnasal drip. This is when extra mucus collects in the throat or back of the nose.  Swelling and warmth over the affected sinuses.  Sore throat.  Sensitivity to light.  How is this diagnosed? This condition is diagnosed based on symptoms, a medical history, and a physical exam. To find out if your condition is acute or chronic, your health care provider may:  Look in your nose for signs of nasal polyps.  Tap over the affected sinus to check for signs of infection.  View the inside of your sinuses using an imaging device that has a light attached (endoscope).  If your health care provider suspects that you have chronic sinusitis, you may also:  Be tested for allergies.  Have a sample of mucus taken from your nose (nasal culture) and checked for bacteria.  Have a mucus sample examined to see if your sinusitis is related to an allergy.  If your sinusitis does not respond to treatment and it lasts longer than 8 weeks, you may have an MRI or CT scan to check your sinuses. These scans also help to determine how severe your infection is. In rare cases, a bone biopsy may be done to rule out more serious types of fungal sinus disease. How is this treated? Treatment for sinusitis depends on the cause and whether your condition is chronic or acute. If a virus is causing your sinusitis, your symptoms will go away on their own  within 10 days. You may be given medicines to relieve your symptoms, including:  Topical nasal decongestants. They shrink swollen nasal passages and let mucus drain from your sinuses.  Antihistamines. These drugs block inflammation that is triggered by allergies. This can help to ease swelling in your nose and sinuses.  Topical nasal corticosteroids. These are nasal sprays that ease inflammation and swelling in your nose and sinuses.  Nasal saline washes.  These rinses can help to get rid of thick mucus in your nose.  If your condition is caused by bacteria, you will be given an antibiotic medicine. If your condition is caused by a fungus, you will be given an antifungal medicine. Surgery may be needed to correct underlying conditions, such as narrow nasal passages. Surgery may also be needed to remove polyps. Follow these instructions at home: Medicines  Take, use, or apply over-the-counter and prescription medicines only as told by your health care provider. These may include nasal sprays.  If you were prescribed an antibiotic medicine, take it as told by your health care provider. Do not stop taking the antibiotic even if you start to feel better. Hydrate and Humidify  Drink enough water to keep your urine clear or pale yellow. Staying hydrated will help to thin your mucus.  Use a cool mist humidifier to keep the humidity level in your home above 50%.  Inhale steam for 10-15 minutes, 3-4 times a day or as told by your health care provider. You can do this in the bathroom while a hot shower is running.  Limit your exposure to cool or dry air. Rest  Rest as much as possible.  Sleep with your head raised (elevated).  Make sure to get enough sleep each night. General instructions  Apply a warm, moist washcloth to your face 3-4 times a day or as told by your health care provider. This will help with discomfort.  Wash your hands often with soap and water to reduce your exposure to viruses and other germs. If soap and water are not available, use hand sanitizer.  Do not smoke. Avoid being around people who are smoking (secondhand smoke).  Keep all follow-up visits as told by your health care provider. This is important. Contact a health care provider if:  You have a fever.  Your symptoms get worse.  Your symptoms do not improve within 10 days. Get help right away if:  You have a severe headache.  You have persistent  vomiting.  You have pain or swelling around your face or eyes.  You have vision problems.  You develop confusion.  Your neck is stiff.  You have trouble breathing. This information is not intended to replace advice given to you by your health care provider. Make sure you discuss any questions you have with your health care provider. Document Released: 07/02/2005 Document Revised: 02/26/2016 Document Reviewed: 04/27/2015 Elsevier Interactive Patient Education  [employer].

## 2017-09-02 ENCOUNTER — Ambulatory Visit (INDEPENDENT_AMBULATORY_CARE_PROVIDER_SITE_OTHER): Payer: BLUE CROSS/BLUE SHIELD | Admitting: Family Medicine

## 2017-09-02 ENCOUNTER — Other Ambulatory Visit: Payer: Self-pay | Admitting: Family Medicine

## 2017-09-02 ENCOUNTER — Encounter: Payer: Self-pay | Admitting: Family Medicine

## 2017-09-02 ENCOUNTER — Other Ambulatory Visit: Payer: Self-pay

## 2017-09-02 VITALS — BP 144/92 | HR 95 | Temp 98.5°F | Wt 199.8 lb

## 2017-09-02 DIAGNOSIS — Z1231 Encounter for screening mammogram for malignant neoplasm of breast: Secondary | ICD-10-CM

## 2017-09-02 DIAGNOSIS — Z1239 Encounter for other screening for malignant neoplasm of breast: Secondary | ICD-10-CM

## 2017-09-02 DIAGNOSIS — F411 Generalized anxiety disorder: Secondary | ICD-10-CM | POA: Diagnosis not present

## 2017-09-02 DIAGNOSIS — E611 Iron deficiency: Secondary | ICD-10-CM

## 2017-09-02 DIAGNOSIS — I10 Essential (primary) hypertension: Secondary | ICD-10-CM | POA: Diagnosis not present

## 2017-09-02 DIAGNOSIS — L659 Nonscarring hair loss, unspecified: Secondary | ICD-10-CM

## 2017-09-02 DIAGNOSIS — G5603 Carpal tunnel syndrome, bilateral upper limbs: Secondary | ICD-10-CM | POA: Diagnosis not present

## 2017-09-02 DIAGNOSIS — F5089 Other specified eating disorder: Secondary | ICD-10-CM | POA: Diagnosis not present

## 2017-09-02 DIAGNOSIS — L680 Hirsutism: Secondary | ICD-10-CM | POA: Diagnosis not present

## 2017-09-02 HISTORY — DX: Iron deficiency: E61.1

## 2017-09-02 LAB — COMPREHENSIVE METABOLIC PANEL
ALT: 13 U/L (ref 0–35)
AST: 23 U/L (ref 0–37)
Albumin: 3.8 g/dL (ref 3.5–5.2)
Alkaline Phosphatase: 66 U/L (ref 39–117)
BUN: 6 mg/dL (ref 6–23)
CO2: 26 mEq/L (ref 19–32)
Calcium: 8.7 mg/dL (ref 8.4–10.5)
Chloride: 103 mEq/L (ref 96–112)
Creatinine, Ser: 0.71 mg/dL (ref 0.40–1.20)
GFR: 94.14 mL/min (ref >60.00)
Glucose, Bld: 105 mg/dL — ABNORMAL HIGH (ref 70–99)
Potassium: 3.7 mEq/L (ref 3.5–5.1)
Sodium: 139 mEq/L (ref 135–145)
Total Bilirubin: 0.6 mg/dL (ref 0.2–1.2)
Total Protein: 6.8 g/dL (ref 6.0–8.3)

## 2017-09-02 LAB — CBC
HCT: 39.9 % (ref 36.0–46.0)
Hemoglobin: 12.4 g/dL (ref 12.0–15.0)
MCHC: 31.1 g/dL (ref 30.0–36.0)
MCV: 77.6 fl — ABNORMAL LOW (ref 78.0–100.0)
Platelets: 72 10*3/uL — ABNORMAL LOW (ref 150.0–400.0)
RBC: 5.14 Mil/uL — ABNORMAL HIGH (ref 3.87–5.11)
RDW: 17.3 % — ABNORMAL HIGH (ref 11.5–15.5)
WBC: 7.1 10*3/uL (ref 4.0–10.5)

## 2017-09-02 LAB — HEMOGLOBIN A1C: Hgb A1c MFr Bld: 5.5 % (ref 4.6–6.5)

## 2017-09-02 LAB — LDL CHOLESTEROL, DIRECT: Direct LDL: 104 mg/dL

## 2017-09-02 LAB — TSH: TSH: 3.24 u[IU]/mL (ref 0.35–4.50)

## 2017-09-02 LAB — LIPID PANEL
Cholesterol: 184 mg/dL (ref 0–200)
HDL: 48.8 mg/dL (ref >39.00)
NonHDL: 134.91
Total CHOL/HDL Ratio: 4
Triglycerides: 313 mg/dL — ABNORMAL HIGH (ref 0.0–149.0)
VLDL: 62.6 mg/dL — ABNORMAL HIGH (ref 0.0–40.0)

## 2017-09-02 MED ORDER — CLONAZEPAM 0.5 MG PO TABS: 0.2500 mg | ORAL_TABLET | Freq: Two times a day (BID) | ORAL | 0 refills | Status: DC | PRN

## 2017-09-02 NOTE — Progress Notes (Signed)
[name], MD Phone: [phone]  Carly [name] is a 47 y.o. female who presents today for follow-up.  HYPERTENSION  Disease Monitoring  Home BP Monitoring 120-140/70-92 Chest pain- no    Dyspnea- no Medications  Compliance-  Taking amlodipine, metoprolol, spironolactone.   Edema- no  Anxiety: Notes this is well controlled.  Some issues at night sleeping with getting her mind to shut off.  Occasionally takes Klonopin for this.  Has not taken it in several months.  Taking Zoloft.  No depression.  Hirsutism: Related to elevated testosterone.  Likely related to PCOS given prior workup.  Notes the Spironolactone did not really help.  Does note thinning of her hair on top of her head.  Some dry skin.  She reports over the last several months she has been craving ice.  She notes no excessive bleeding.  Has a menstrual cycle 1 time per month lasting 5-6 days.  No bloody bowel movements.  Reports carpal tunnel issues.  Has seen orthopedics and they recommended surgery after nerve conduction studies.  She is not interested in that and wants a second opinion.  Bracing and gloves help.  Notes pain in her hands at times.   Social History   Tobacco Use  Smoking Status Current Every Day Smoker  . Packs/day: 0.25  . Years: 28.00  . Pack years: 7.00  . Types: Cigarettes  Smokeless Tobacco Never Used     ROS see history of present illness  Objective  Physical Exam Vitals:   09/02/17 0823  BP: (!) 144/92  Pulse: 95  Temp: 98.5 F (36.9 C)  SpO2: 98%    BP Readings from Last 3 Encounters:  09/02/17 (!) 144/92  08/29/17 (!) 146/80  03/04/17 (!) 157/99   Wt Readings from Last 3 Encounters:  09/02/17 199 lb 12.8 oz (90.6 kg)  08/29/17 202 lb (91.6 kg)  03/04/17 205 lb (93 kg)    Physical Exam  Constitutional: No distress.  Cardiovascular: Normal rate, regular rhythm and normal heart sounds.  Pulmonary/Chest: Effort normal and breath sounds normal.  Musculoskeletal:  She exhibits no edema.  Positive Tinel's bilateral wrists  Neurological: She is alert. Gait normal.  Skin: Skin is warm and dry. She is not diaphoretic.  Thinning hair all over her scalp with no signs of erythema or scaling     Assessment/Plan: Please see individual problem list.  HTN (hypertension) Somewhat uncontrolled at home and in the office.  We will check lab work and determine changes once that returns.  Carpal tunnel syndrome Continues to have issues with this.  Will refer to a new orthopedic office.  Female hirsutism Not significantly helped by spironolactone.  She will continue to monitor.  Losing hair on the top of her head which could be related to her elevated testosterone.  We will check a TSH.  Generalized anxiety disorder Well-controlled.  Continue current regimen.  [place] Concerning for iron deficiency.  Will check labs.  Patient reports she is going to contact gynecology to get set up for gynecologic exam.  Orders Placed This Encounter  Procedures  . MM SCREENING BREAST TOMO BILATERAL    Standing Status:   Future    Standing Expiration Date:   11/01/2018    Order Specific Question:   Reason for Exam (SYMPTOM  OR DIAGNOSIS REQUIRED)    Answer:   breast cancer screening    Order Specific Question:   Is the patient pregnant?    Answer:   No    Order  Specific Question:   Preferred imaging location?    Answer:   [facility]  . Comp Met (CMET)  . HgB A1c  . Lipid panel  . CBC  . Iron, TIBC and Ferritin Panel  . TSH  . LDL cholesterol, direct  . Ambulatory referral to Orthopedic Surgery    Referral Priority:   Routine    Referral Type:   Surgical    Referral Reason:   Specialty Services Required    Requested Specialty:   Orthopedic Surgery    Number of Visits Requested:   1    Meds ordered this encounter  Medications  . clonazePAM (KLONOPIN) 0.5 MG tablet    Sig: Take 0.5 tablets (0.25 mg total) by mouth 2 (two) times daily as needed for anxiety.     Dispense:  30 tablet    Refill:  0     [name], MD [place]

## 2017-09-02 NOTE — Assessment & Plan Note (Addendum)
Not significantly helped by spironolactone.  She will continue to monitor.  Losing hair on the top of her head which could be related to her elevated testosterone.  We will check a TSH.

## 2017-09-02 NOTE — Assessment & Plan Note (Signed)
Well-controlled.  Continue current regimen. 

## 2017-09-02 NOTE — Assessment & Plan Note (Signed)
Concerning for iron deficiency.  Will check labs.

## 2017-09-02 NOTE — Assessment & Plan Note (Signed)
Continues to have issues with this.  Will refer to a new orthopedic office.

## 2017-09-02 NOTE — Assessment & Plan Note (Signed)
Somewhat uncontrolled at home and in the office.  We will check lab work and determine changes once that returns.

## 2017-09-02 NOTE — Progress Notes (Signed)
The 10-year ASCVD risk score Mikey Bussing DC Brooke Bonito., et al., 2013) is: 5.1%   Values used to calculate the score:     Age: 47 years     Sex: Female     Is Non-Hispanic African American: No     Diabetic: No     Tobacco smoker: Yes     Systolic Blood Pressure: 438 mmHg     Is BP treated: Yes     HDL Cholesterol: 48.8 mg/dL     Total Cholesterol: 184 mg/dL

## 2017-09-02 NOTE — Patient Instructions (Signed)
Nice to see you. Please continue to check your blood pressure.  We will contact you with your lab results and determine what to do with your blood pressure medication.

## 2017-09-03 ENCOUNTER — Other Ambulatory Visit: Payer: Self-pay | Admitting: Family Medicine

## 2017-09-03 DIAGNOSIS — E611 Iron deficiency: Secondary | ICD-10-CM

## 2017-09-03 LAB — IRON,TIBC AND FERRITIN PANEL
%SAT: 5 % (calc) — ABNORMAL LOW (ref 11–50)
Ferritin: 8 ng/mL — ABNORMAL LOW (ref 10–232)
Iron: 26 ug/dL — ABNORMAL LOW (ref 40–190)
TIBC: 520 mcg/dL (calc) — ABNORMAL HIGH (ref 250–450)

## 2017-09-03 MED ORDER — FERROUS SULFATE 325 (65 FE) MG PO TABS: 325.0000 mg | ORAL_TABLET | Freq: Every day | ORAL | 1 refills | Status: DC

## 2017-09-08 ENCOUNTER — Other Ambulatory Visit: Payer: Self-pay | Admitting: Family Medicine

## 2017-10-07 DIAGNOSIS — G5603 Carpal tunnel syndrome, bilateral upper limbs: Secondary | ICD-10-CM | POA: Diagnosis not present

## 2017-11-04 ENCOUNTER — Other Ambulatory Visit: Payer: Self-pay | Admitting: Family Medicine

## 2017-12-04 DIAGNOSIS — N939 Abnormal uterine and vaginal bleeding, unspecified: Secondary | ICD-10-CM | POA: Diagnosis not present

## 2017-12-10 DIAGNOSIS — N939 Abnormal uterine and vaginal bleeding, unspecified: Secondary | ICD-10-CM | POA: Diagnosis not present

## 2017-12-10 DIAGNOSIS — D696 Thrombocytopenia, unspecified: Secondary | ICD-10-CM | POA: Diagnosis not present

## 2017-12-10 DIAGNOSIS — R7989 Other specified abnormal findings of blood chemistry: Secondary | ICD-10-CM | POA: Diagnosis not present

## 2017-12-17 DIAGNOSIS — D691 Qualitative platelet defects: Secondary | ICD-10-CM | POA: Diagnosis not present

## 2017-12-17 DIAGNOSIS — N939 Abnormal uterine and vaginal bleeding, unspecified: Secondary | ICD-10-CM | POA: Diagnosis not present

## 2017-12-17 DIAGNOSIS — E611 Iron deficiency: Secondary | ICD-10-CM | POA: Diagnosis not present

## 2017-12-20 ENCOUNTER — Telehealth: Payer: Self-pay | Admitting: Family Medicine

## 2017-12-20 ENCOUNTER — Other Ambulatory Visit: Payer: Self-pay | Admitting: *Deleted

## 2017-12-20 MED ORDER — METOPROLOL SUCCINATE ER 50 MG PO TB24: 100.0000 mg | ORAL_TABLET | Freq: Every day | ORAL | 0 refills | Status: DC

## 2017-12-20 NOTE — Telephone Encounter (Signed)
Copied from [place] [id number]. Topic: Quick Communication - Rx Refill/Question >> Dec 20, 2017  2:52 PM Celedonio Savage L wrote: Medication: metoprolol succinate (TOPROL-XL) 50 MG 24 hr tablet  Has the patient contacted their pharmacy? Yes they advised her top call her primary doctor (Agent: If no, request that the patient contact the pharmacy for the refill.) (Agent: If yes, when and what did the pharmacy advise?)  Preferred Pharmacy (with phone number or street name):     [facility] [place], [address] AT [address] [phone] (Phone) [phone] (Fax)      Agent: Please be advised that RX refills may take up to 3 business days. We ask that you follow-up with your pharmacy.

## 2017-12-20 NOTE — Telephone Encounter (Signed)
Rx Toprol Xl refilled with no additional per protocol. Patient has appointment in August.

## 2017-12-23 DIAGNOSIS — D219 Benign neoplasm of connective and other soft tissue, unspecified: Secondary | ICD-10-CM | POA: Diagnosis not present

## 2017-12-23 DIAGNOSIS — N939 Abnormal uterine and vaginal bleeding, unspecified: Secondary | ICD-10-CM | POA: Diagnosis not present

## 2018-01-08 DIAGNOSIS — N939 Abnormal uterine and vaginal bleeding, unspecified: Secondary | ICD-10-CM | POA: Diagnosis not present

## 2018-01-08 DIAGNOSIS — N921 Excessive and frequent menstruation with irregular cycle: Secondary | ICD-10-CM | POA: Diagnosis not present

## 2018-02-11 DIAGNOSIS — N921 Excessive and frequent menstruation with irregular cycle: Secondary | ICD-10-CM | POA: Diagnosis not present

## 2018-02-11 DIAGNOSIS — Z01818 Encounter for other preprocedural examination: Secondary | ICD-10-CM | POA: Diagnosis not present

## 2018-02-11 NOTE — H&P (Signed)
Carly [name] is a 47 y.o. female here for Pre Op Consulting .  HPI:  Pt presents for a preoperative visit to schedule a D&C, hysteroscopy, polypectomy and Novasure ablation.  She has a hx of: menometrorrhagia  Workup has included: - Uterus:10x6x6cm with a 11mm stripe - Ovaries:normal and small - Other:heterogenous, filled   Embx:   Diagnosis:  ENDOMETRIUM, BIOPSY:  BENIGN ENDOMETRIAL POLYP. NO HYPERPLASIA, CARCINOMA OR ENDOMETRITIS.   Past Medical History:  has a past medical history of Bilateral carpal tunnel syndrome (02/26/2017), Carpal tunnel syndrome (01/20/2016), COPD (chronic obstructive pulmonary disease) (CMS-HCC) (01/13/2015), Generalized anxiety disorder (08/23/2014), HTN (hypertension) (08/23/2014), Insomnia (08/15/2016), and Thrombocytopenia (CMS-HCC) (02/12/2016).  Past Surgical History:  has a past surgical history that includes Tubal ligation (1997). Family History: family history includes Hemophilia in her father and paternal uncle; Myocardial Infarction (Heart attack) in her mother; Other in her maternal aunt. Social History:  reports that she has been smoking cigarettes.  She has never used smokeless tobacco. She reports that she drinks alcohol. She reports that she does not use drugs. OB/GYN History:          OB History    Gravida  2   Para  2   Term  2   Preterm      AB      Living  2     SAB      TAB      Ectopic      Molar      Multiple      Live Births             Allergies: is allergic to meloxicam. Medications:  Current Outpatient Medications:  .  amLODIPine (NORVASC) 10 MG tablet, Take 10 mg by mouth once daily  , Disp: , Rfl:  .  budesonide-formoterol (SYMBICORT) 160-4.5 mcg/actuation inhaler, Inhale 2 inhalations into the lungs 2 (two) times daily  , Disp: , Rfl:  .  cholecalciferol (CHOLECALCIFEROL) 1,000 unit tablet, Take 1,000 Units by mouth once daily  , Disp: , Rfl:  .  clonazePAM (KLONOPIN) 0.5 MG tablet, Take 0.5  mg by mouth 2 (two) times daily as needed  , Disp: , Rfl:  .  cyanocobalamin (VITAMIN B12) 1000 MCG tablet, Take 1,000 mcg by mouth once daily  , Disp: , Rfl:  .  ferrous sulfate 325 (65 FE) MG tablet, Take 325 mg by mouth daily with breakfast, Disp: , Rfl:  .  ibuprofen (ADVIL,MOTRIN) 200 MG tablet, Take 600 mg by mouth every 8 (eight) hours as needed  , Disp: , Rfl:  .  metoprolol succinate (TOPROL-XL) 50 MG XL tablet, Take 50 mg by mouth once daily  , Disp: , Rfl:  .  sertraline (ZOLOFT) 100 MG tablet, Take 100 mg by mouth once daily  , Disp: , Rfl:  .  spironolactone (ALDACTONE) 25 MG tablet, Take 25 mg by mouth once daily  , Disp: , Rfl:   Review of Systems: No SOB, no palpitations or chest pain, no new lower extremity edema, no nausea or vomiting or bowel or bladder complaints. See HPI for gyn specific ROS.   Exam:      Vitals:   02/11/18 1634  BP: (!) 143/97  Pulse: 101    WDWN   female in NAD Body mass index is 33.2 kg/m.  General: Patient is well-groomed, well-nourished, appears stated age in no acute distress  HEENT: head is atraumatic and normocephalic, trachea is midline, neck is supple with no palpable  nodules  CV: Regular rhythm and normal heart rate, no murmur  Pulm: Clear to auscultation throughout lung fields with no wheezing, crackles, or rhonchi. No increased work of breathing  Abdomen: soft , no mass, non-tender, no rebound tenderness, no hepatomegaly  Pelvic: Deferred  Impression:   The primary encounter diagnosis was Preop examination. A diagnosis of Menometrorrhagia was also pertinent to this visit.    Plan:   -  Preoperative visit: D&C hysteroscopy, polypectomy and Novasure ablation. Consents signed today. Risks of surgery were discussed with the patient including but not limited to: bleeding which may require transfusion; infection which may require antibiotics; injury to uterus or surrounding organs; intrauterine scarring which  may impair future fertility; need for additional procedures including laparotomy or laparoscopy; and other postoperative/anesthesia complications. Written informed consent was obtained.  This is a scheduled same-day surgery. She will have a postop visit in 2 weeks to review operative findings and pathology.  Return in about 2 weeks (around 02/25/2018) for Postop check.

## 2018-02-14 ENCOUNTER — Encounter
Admission: RE | Admit: 2018-02-14 | Discharge: 2018-02-14 | Disposition: A | Discharge status: Home / Self Care | Payer: BLUE CROSS/BLUE SHIELD | Source: Ambulatory Visit | Attending: Obstetrics and Gynecology | Admitting: Obstetrics and Gynecology

## 2018-02-14 ENCOUNTER — Other Ambulatory Visit: Payer: Self-pay

## 2018-02-14 DIAGNOSIS — Z0183 Encounter for blood typing: Secondary | ICD-10-CM | POA: Diagnosis not present

## 2018-02-14 DIAGNOSIS — Z01812 Encounter for preprocedural laboratory examination: Secondary | ICD-10-CM | POA: Insufficient documentation

## 2018-02-14 HISTORY — DX: Depression, unspecified: F32.A

## 2018-02-14 HISTORY — DX: Major depressive disorder, single episode, unspecified: F32.9

## 2018-02-14 LAB — BASIC METABOLIC PANEL
Anion gap: 11 (ref 5–15)
BUN: 6 mg/dL (ref 6–20)
CO2: 21 mmol/L — ABNORMAL LOW (ref 22–32)
Calcium: 8.7 mg/dL — ABNORMAL LOW (ref 8.9–10.3)
Chloride: 107 mmol/L (ref 98–111)
Creatinine, Ser: 0.65 mg/dL (ref 0.44–1.00)
GFR calc Af Amer: >60 mL/min (ref >60)
GFR calc non Af Amer: >60 mL/min (ref >60)
Glucose, Bld: 105 mg/dL — ABNORMAL HIGH (ref 70–99)
Potassium: 3.5 mmol/L (ref 3.5–5.1)
Sodium: 139 mmol/L (ref 135–145)

## 2018-02-14 LAB — TYPE AND SCREEN
ABO/RH(D): O NEG
Antibody Screen: NEGATIVE

## 2018-02-14 LAB — CBC
HCT: 42.1 % (ref 35.0–47.0)
Hemoglobin: 13.9 g/dL (ref 12.0–16.0)
MCH: 28 pg (ref 26.0–34.0)
MCHC: 33 g/dL (ref 32.0–36.0)
MCV: 84.8 fL (ref 80.0–100.0)
Platelets: 52 10*3/uL — ABNORMAL LOW (ref 150–440)
RBC: 4.96 MIL/uL (ref 3.80–5.20)
RDW: 18.5 % — ABNORMAL HIGH (ref 11.5–14.5)
WBC: 7.2 10*3/uL (ref 3.6–11.0)

## 2018-02-14 NOTE — Patient Instructions (Signed)
Your procedure is scheduled PJ:ASNKNL 8/9 Report to Day Surgery. To find out your arrival time please call [phone] between 1PM - 3PM on Thurs 8/8.  Remember: Instructions that are not followed completely may result in serious medical risk,  up to and including death, or upon the discretion of your surgeon and anesthesiologist your  surgery may need to be rescheduled.     _X__ 1. Do not eat food after midnight the night before your procedure.                 No gum chewing or hard candies. You may drink clear liquids up to 2 hours                 before you are scheduled to arrive for your surgery- DO not drink clear                 liquids within 2 hours of the start of your surgery.                 Clear Liquids include:  water, apple juice without pulp, clear carbohydrate                 drink such as Clearfast of Gatorade, Black Coffee or Tea (Do not add                 anything to coffee or tea).  __X__2.  On the morning of surgery brush your teeth with toothpaste and water, you                may rinse your mouth with mouthwash if you wish.  Do not swallow any toothpaste of mouthwash.     _X__ 3.  No Alcohol for 24 hours before or after surgery.   _X__ 4.  Do Not Smoke or use e-cigarettes For 24 Hours Prior to Your Surgery.                 Do not use any chewable tobacco products for at least 6 hours prior to                 surgery.  ____  5.  Bring all medications with you on the day of surgery if instructed.   _x___  6.  Notify your doctor if there is any change in your medical condition      (cold, fever, infections).     Do not wear jewelry, make-up, hairpins, clips or nail polish. Do not wear lotions, powders, or perfumes. You may wear deodorant. Do not shave 48 hours prior to surgery. Men may shave face and neck. Do not bring valuables to the hospital.    [facility] is not responsible for any belongings or valuables.  Contacts, dentures  or bridgework may not be worn into surgery. Leave your suitcase in the car. After surgery it may be brought to your room. For patients admitted to the hospital, discharge time is determined by your treatment team.   Patients discharged the day of surgery will not be allowed to drive home.   Please read over the following fact sheets that you were given:    _x___ Take these medicines the morning of surgery with A SIP OF WATER:    1. ALPRAZolam (XANAX) 0.5 MG tablet  2. amLODipine (NORVASC) 10 MG tablet  3. budesonide-formoterol (SYMBICORT) 160-4.5 MCG/ACT inhaler  4.metoprolol succinate (TOPROL-XL) 50 MG 24 hr tablet  5.  6.  ____ Fleet Enema (  as directed)   ____ Use CHG Soap as directed  _x___ Use inhalers on the day of surgery  ____ Stop metformin 2 days prior to surgery    ____ Take 1/2 of usual insulin dose the night before surgery. No insulin the morning          of surgery.   ____ Stop Coumadin/Plavix/aspirin on   ___x_ Stop Anti-inflammatories on ibuprofen (ADVIL,MOTRIN) 200 MG tablet and Aleve    May take tylenol   __x__ Stop supplements until after surgery.  CBD Oil   ____ Bring C-Pap to the hospital.

## 2018-02-17 ENCOUNTER — Ambulatory Visit: Payer: BLUE CROSS/BLUE SHIELD | Admitting: Family Medicine

## 2018-02-17 ENCOUNTER — Encounter: Payer: Self-pay | Admitting: Family Medicine

## 2018-02-17 VITALS — BP 140/80 | HR 96 | Temp 98.4°F | Ht 64.0 in | Wt 192.0 lb

## 2018-02-17 DIAGNOSIS — R8290 Unspecified abnormal findings in urine: Secondary | ICD-10-CM

## 2018-02-17 DIAGNOSIS — D696 Thrombocytopenia, unspecified: Secondary | ICD-10-CM | POA: Diagnosis not present

## 2018-02-17 DIAGNOSIS — E611 Iron deficiency: Secondary | ICD-10-CM | POA: Diagnosis not present

## 2018-02-17 DIAGNOSIS — F411 Generalized anxiety disorder: Secondary | ICD-10-CM

## 2018-02-17 DIAGNOSIS — I10 Essential (primary) hypertension: Secondary | ICD-10-CM | POA: Diagnosis not present

## 2018-02-17 LAB — POCT URINALYSIS DIPSTICK
Bilirubin, UA: NEGATIVE
Glucose, UA: NEGATIVE
Ketones, UA: NEGATIVE
Leukocytes, UA: NEGATIVE
Nitrite, UA: NEGATIVE
Protein, UA: NEGATIVE
Spec Grav, UA: <=1.005 — AB (ref 1.010–1.025)
Urobilinogen, UA: 0.2 E.U./dL
pH, UA: 5 (ref 5.0–8.0)

## 2018-02-17 MED ORDER — AMLODIPINE BESYLATE 10 MG PO TABS: 10.0000 mg | ORAL_TABLET | Freq: Every day | ORAL | 3 refills | Status: DC

## 2018-02-17 MED ORDER — SERTRALINE HCL 100 MG PO TABS: 100.0000 mg | ORAL_TABLET | Freq: Every day | ORAL | 3 refills | Status: DC

## 2018-02-17 MED ORDER — SPIRONOLACTONE 25 MG PO TABS: 25.0000 mg | ORAL_TABLET | Freq: Every day | ORAL | 1 refills | Status: DC

## 2018-02-17 NOTE — Assessment & Plan Note (Signed)
Well-controlled.  Continue Zoloft. 

## 2018-02-17 NOTE — Assessment & Plan Note (Signed)
Blood pressure is acceptable though close to being above goal.  We will let her get through her upcoming gynecologic procedure and then reevaluate her blood pressure.

## 2018-02-17 NOTE — Assessment & Plan Note (Signed)
She will see hematology as planned. ?

## 2018-02-17 NOTE — Patient Instructions (Signed)
Nice to see you. Please complete the stool cards. Please see Dr. Grayland Ormond as planned. We will check some lab work today and contact you with the results.

## 2018-02-17 NOTE — Progress Notes (Signed)
[name], MD Phone: [phone]  Carly [name] is a 47 y.o. female who presents today for f/u.  CC: htn, iron deficiency, thrombocytopenia, anxiety  HYPERTENSION  Disease Monitoring  Home BP Monitoring 140/80, she does note on one occasion while at work it spiked up to 215/150 for 2 to 3 weeks ago.  She thinks she was dehydrated.  It came down with hydration and rest. Chest pain- no    Dyspnea- no Medications  Compliance-  Taking amlodipine, metoprol, spironolactone.  Edema- no  Iron deficiency: Noted to be iron deficient previously.  No anemia.  She was supposed to complete stool cards and urine testing though has not done that yet.  She has been taking an iron supplement.  She did end up seeing GYN and was diagnosed with menorrhagia.  She notes the first day of her menstrual cycle she will change a pad every couple of hours for the whole day and then it will go to a normal menstrual cycle.  She was found to have fibroids and a polyp.  She notes brown bowel movements.  No blood in her stool.  No family history of colon cancer.  She does have thrombocytopenia and was supposed to follow-up with hematology.  She reports she sees them tomorrow.  Anxiety: Notes this is well controlled.  Taking Zoloft.  No depression.   Social History   Tobacco Use  Smoking Status Current Every Day Smoker  . Packs/day: 0.50  . Years: 28.00  . Pack years: 14.00  . Types: Cigarettes  Smokeless Tobacco Never Used     ROS see history of present illness  Objective  Physical Exam Vitals:   02/17/18 1418  BP: 140/80  Pulse: 96  Temp: 98.4 F (36.9 C)  SpO2: 98%    BP Readings from Last 3 Encounters:  02/17/18 140/80  02/14/18 (!) 142/102  09/02/17 (!) 144/92   Wt Readings from Last 3 Encounters:  02/17/18 192 lb (87.1 kg)  02/14/18 193 lb 2 oz (87.6 kg)  09/02/17 199 lb 12.8 oz (90.6 kg)    Physical Exam  Constitutional: No distress.  Cardiovascular: Normal rate, regular rhythm  and normal heart sounds.  Pulmonary/Chest: Effort normal and breath sounds normal.  Musculoskeletal: She exhibits no edema.  Neurological: She is alert.  Skin: Skin is warm and dry. She is not diaphoretic.     Assessment/Plan: Please see individual problem list.  Iron deficiency Previously found to be iron deficient. No anemia. Did not complete initial work-up.  We will have her complete this today. Refer to GI to consider complete evaluation.  I do suspect this is most likely related to her heavy menstrual cycles for which she is being treated by GYN.  Thrombocytopenia ([place]) She will see hematology as planned.  HTN (hypertension) Blood pressure is acceptable though close to being above goal.  We will let her get through her upcoming gynecologic procedure and then reevaluate her blood pressure.  Generalized anxiety disorder Well-controlled.  Continue Zoloft.   Orders Placed This Encounter  Procedures  . Basic Metabolic Panel (BMET)  . Iron, TIBC and Ferritin Panel  . Ambulatory referral to Gastroenterology    Referral Priority:   Routine    Referral Type:   Consultation    Referral Reason:   Specialty Services Required    Number of Visits Requested:   1  . POCT Urinalysis Dipstick    Meds ordered this encounter  Medications  . amLODipine (NORVASC) 10 MG tablet  Sig: Take 1 tablet (10 mg total) by mouth daily.    Dispense:  90 tablet    Refill:  3  . sertraline (ZOLOFT) 100 MG tablet    Sig: Take 1 tablet (100 mg total) by mouth daily.    Dispense:  90 tablet    Refill:  3  . spironolactone (ALDACTONE) 25 MG tablet    Sig: Take 1 tablet (25 mg total) by mouth daily.    Dispense:  90 tablet    Refill:  1     [name], MD [place]

## 2018-02-17 NOTE — Assessment & Plan Note (Addendum)
Previously found to be iron deficient. No anemia. Did not complete initial work-up.  We will have her complete this today. Refer to GI to consider complete evaluation.  I do suspect this is most likely related to her heavy menstrual cycles for which she is being treated by GYN.

## 2018-02-18 LAB — URINALYSIS, MICROSCOPIC ONLY: RBC / HPF: NONE SEEN (ref 0–?)

## 2018-02-18 LAB — IRON,TIBC AND FERRITIN PANEL
%SAT: 6 % (calc) — ABNORMAL LOW (ref 16–45)
Ferritin: 15 ng/mL — ABNORMAL LOW (ref 16–232)
Iron: 30 ug/dL — ABNORMAL LOW (ref 40–190)
TIBC: 475 mcg/dL (calc) — ABNORMAL HIGH (ref 250–450)

## 2018-02-18 LAB — BASIC METABOLIC PANEL
BUN: 4 mg/dL — ABNORMAL LOW (ref 6–23)
CO2: 24 mEq/L (ref 19–32)
Calcium: 9.1 mg/dL (ref 8.4–10.5)
Chloride: 104 mEq/L (ref 96–112)
Creatinine, Ser: 0.69 mg/dL (ref 0.40–1.20)
GFR: 97.1 mL/min (ref >60.00)
Glucose, Bld: 99 mg/dL (ref 70–99)
Potassium: 3.5 mEq/L (ref 3.5–5.1)
Sodium: 137 mEq/L (ref 135–145)

## 2018-02-21 ENCOUNTER — Ambulatory Visit: Payer: BLUE CROSS/BLUE SHIELD | Admitting: Certified Registered Nurse Anesthetist

## 2018-02-21 ENCOUNTER — Ambulatory Visit
Admission: RE | Admit: 2018-02-21 | Discharge: 2018-02-21 | Disposition: A | Discharge status: Home / Self Care | Payer: BLUE CROSS/BLUE SHIELD | Source: Ambulatory Visit | Attending: Obstetrics and Gynecology | Admitting: Obstetrics and Gynecology

## 2018-02-21 ENCOUNTER — Other Ambulatory Visit: Payer: Self-pay

## 2018-02-21 ENCOUNTER — Encounter
Admission: RE | Disposition: A | Discharge status: Home / Self Care | Payer: Self-pay | Source: Ambulatory Visit | Attending: Obstetrics and Gynecology

## 2018-02-21 DIAGNOSIS — Z888 Allergy status to other drugs, medicaments and biological substances status: Secondary | ICD-10-CM | POA: Diagnosis not present

## 2018-02-21 DIAGNOSIS — F17210 Nicotine dependence, cigarettes, uncomplicated: Secondary | ICD-10-CM | POA: Diagnosis not present

## 2018-02-21 DIAGNOSIS — N8500 Endometrial hyperplasia, unspecified: Secondary | ICD-10-CM | POA: Insufficient documentation

## 2018-02-21 DIAGNOSIS — Z8249 Family history of ischemic heart disease and other diseases of the circulatory system: Secondary | ICD-10-CM | POA: Insufficient documentation

## 2018-02-21 DIAGNOSIS — I10 Essential (primary) hypertension: Secondary | ICD-10-CM | POA: Insufficient documentation

## 2018-02-21 DIAGNOSIS — N921 Excessive and frequent menstruation with irregular cycle: Secondary | ICD-10-CM | POA: Insufficient documentation

## 2018-02-21 DIAGNOSIS — F411 Generalized anxiety disorder: Secondary | ICD-10-CM | POA: Insufficient documentation

## 2018-02-21 DIAGNOSIS — Z79899 Other long term (current) drug therapy: Secondary | ICD-10-CM | POA: Diagnosis not present

## 2018-02-21 DIAGNOSIS — M1990 Unspecified osteoarthritis, unspecified site: Secondary | ICD-10-CM | POA: Insufficient documentation

## 2018-02-21 DIAGNOSIS — D696 Thrombocytopenia, unspecified: Secondary | ICD-10-CM | POA: Insufficient documentation

## 2018-02-21 DIAGNOSIS — F329 Major depressive disorder, single episode, unspecified: Secondary | ICD-10-CM | POA: Diagnosis not present

## 2018-02-21 DIAGNOSIS — J449 Chronic obstructive pulmonary disease, unspecified: Secondary | ICD-10-CM | POA: Diagnosis not present

## 2018-02-21 DIAGNOSIS — N840 Polyp of corpus uteri: Secondary | ICD-10-CM | POA: Diagnosis not present

## 2018-02-21 DIAGNOSIS — N939 Abnormal uterine and vaginal bleeding, unspecified: Secondary | ICD-10-CM | POA: Diagnosis not present

## 2018-02-21 HISTORY — PX: HYSTEROSCOPY WITH NOVASURE: SHX5574

## 2018-02-21 LAB — ABO/RH: ABO/RH(D): O NEG

## 2018-02-21 LAB — POCT PREGNANCY, URINE: Preg Test, Ur: NEGATIVE

## 2018-02-21 SURGERY — HYSTEROSCOPY WITH NOVASURE
Anesthesia: General | Wound class: Clean Contaminated

## 2018-02-21 MED ORDER — LIDOCAINE HCL (CARDIAC) PF 100 MG/5ML IV SOSY
PREFILLED_SYRINGE | INTRAVENOUS | Status: DC | PRN
  Administered 2018-02-21: 60 mg via INTRAVENOUS

## 2018-02-21 MED ORDER — MIDAZOLAM HCL 2 MG/2ML IJ SOLN
INTRAMUSCULAR | Status: AC
  Filled 2018-02-21: qty 2

## 2018-02-21 MED ORDER — DEXAMETHASONE SODIUM PHOSPHATE 10 MG/ML IJ SOLN
INTRAMUSCULAR | Status: DC | PRN
  Administered 2018-02-21: 10 mg via INTRAVENOUS

## 2018-02-21 MED ORDER — PHENYLEPHRINE HCL 10 MG/ML IJ SOLN
INTRAMUSCULAR | Status: DC | PRN
  Administered 2018-02-21: 150 ug via INTRAVENOUS

## 2018-02-21 MED ORDER — ONDANSETRON HCL 4 MG/2ML IJ SOLN
INTRAMUSCULAR | Status: DC | PRN
  Administered 2018-02-21: 4 mg via INTRAVENOUS

## 2018-02-21 MED ORDER — OXYCODONE HCL 5 MG PO TABS
5.0000 mg | ORAL_TABLET | Freq: Once | ORAL | Status: AC
  Administered 2018-02-21: 5 mg via ORAL

## 2018-02-21 MED ORDER — OXYCODONE HCL 5 MG PO TABS
ORAL_TABLET | ORAL | Status: AC
  Administered 2018-02-21: 5 mg via ORAL
  Filled 2018-02-21: qty 1

## 2018-02-21 MED ORDER — FENTANYL CITRATE (PF) 100 MCG/2ML IJ SOLN
INTRAMUSCULAR | Status: AC
  Filled 2018-02-21: qty 2

## 2018-02-21 MED ORDER — FAMOTIDINE 20 MG PO TABS
20.0000 mg | ORAL_TABLET | Freq: Once | ORAL | Status: AC
  Administered 2018-02-21: 20 mg via ORAL

## 2018-02-21 MED ORDER — FENTANYL CITRATE (PF) 100 MCG/2ML IJ SOLN: 25.0000 ug | INTRAMUSCULAR | Status: DC | PRN

## 2018-02-21 MED ORDER — SILVER NITRATE-POT NITRATE 75-25 % EX MISC
CUTANEOUS | Status: DC | PRN
  Administered 2018-02-21: 1

## 2018-02-21 MED ORDER — ONDANSETRON HCL 4 MG/2ML IJ SOLN: 4.0000 mg | Freq: Once | INTRAMUSCULAR | Status: DC | PRN

## 2018-02-21 MED ORDER — MIDAZOLAM HCL 2 MG/2ML IJ SOLN
INTRAMUSCULAR | Status: DC | PRN
  Administered 2018-02-21: 2 mg via INTRAVENOUS

## 2018-02-21 MED ORDER — LIDOCAINE HCL (PF) 2 % IJ SOLN
INTRAMUSCULAR | Status: AC
  Filled 2018-02-21: qty 10

## 2018-02-21 MED ORDER — KETOROLAC TROMETHAMINE 30 MG/ML IJ SOLN
INTRAMUSCULAR | Status: DC | PRN
  Administered 2018-02-21: 30 mg via INTRAVENOUS

## 2018-02-21 MED ORDER — FAMOTIDINE 20 MG PO TABS
ORAL_TABLET | ORAL | Status: AC
  Administered 2018-02-21: 20 mg via ORAL
  Filled 2018-02-21: qty 1

## 2018-02-21 MED ORDER — FENTANYL CITRATE (PF) 100 MCG/2ML IJ SOLN
INTRAMUSCULAR | Status: DC | PRN
  Administered 2018-02-21 (×2): 50 ug via INTRAVENOUS
  Administered 2018-02-21 (×3): 25 ug via INTRAVENOUS

## 2018-02-21 MED ORDER — KETOROLAC TROMETHAMINE 30 MG/ML IJ SOLN
INTRAMUSCULAR | Status: AC
  Filled 2018-02-21: qty 1

## 2018-02-21 MED ORDER — PROPOFOL 10 MG/ML IV BOLUS
INTRAVENOUS | Status: DC | PRN
  Administered 2018-02-21: 170 mg via INTRAVENOUS
  Administered 2018-02-21: 30 mg via INTRAVENOUS

## 2018-02-21 MED ORDER — DEXAMETHASONE SODIUM PHOSPHATE 10 MG/ML IJ SOLN
INTRAMUSCULAR | Status: AC
  Filled 2018-02-21: qty 1

## 2018-02-21 MED ORDER — LACTATED RINGERS IV SOLN
INTRAVENOUS | Status: DC
  Administered 2018-02-21: 1000 mL via INTRAVENOUS

## 2018-02-21 MED ORDER — PROPOFOL 10 MG/ML IV BOLUS
INTRAVENOUS | Status: AC
Start: 2018-02-21 — End: ?
  Filled 2018-02-21: qty 20

## 2018-02-21 MED ORDER — ONDANSETRON HCL 4 MG/2ML IJ SOLN
INTRAMUSCULAR | Status: AC
  Filled 2018-02-21: qty 2

## 2018-02-21 SURGICAL SUPPLY — 16 items
ABLATOR SURESOUND NOVASURE (ABLATOR) ×3 IMPLANT
CANISTER SUCT 1200ML W/VALVE (MISCELLANEOUS) ×3 IMPLANT
CATH ROBINSON RED A/P 16FR (CATHETERS) ×3 IMPLANT
GLOVE BIO SURGEON STRL SZ7 (GLOVE) ×3 IMPLANT
GLOVE INDICATOR 7.5 STRL GRN (GLOVE) ×3 IMPLANT
GOWN STRL REUS W/ TWL LRG LVL3 (GOWN DISPOSABLE) ×2 IMPLANT
GOWN STRL REUS W/TWL LRG LVL3 (GOWN DISPOSABLE) ×4
IV LACTATED RINGERS 1000ML (IV SOLUTION) ×3 IMPLANT
KIT TURNOVER CYSTO (KITS) ×3 IMPLANT
NS IRRIG 500ML POUR BTL (IV SOLUTION) ×3 IMPLANT
PACK DNC HYST (MISCELLANEOUS) ×3 IMPLANT
PAD OB MATERNITY 4.3X12.25 (PERSONAL CARE ITEMS) ×3 IMPLANT
PAD PREP 24X41 OB/GYN DISP (PERSONAL CARE ITEMS) ×3 IMPLANT
TOWEL OR 17X26 4PK STRL BLUE (TOWEL DISPOSABLE) ×3 IMPLANT
TUBING CONNECTING 10 (TUBING) ×2 IMPLANT
TUBING CONNECTING 10' (TUBING) ×1

## 2018-02-21 NOTE — Discharge Instructions (Addendum)
Discharge instructions after a hysteroscopy with dilation and curettage  Signs and Symptoms to Report  Call our office at [phone] if you have any of the following:   . Fever over 100.4 degrees or higher . Severe stomach pain not relieved with pain medications . Bright red bleeding that's heavier than a period that does not slow with rest after the first 24 hours . To go the bathroom a lot (frequency), you can't hold your urine (urgency), or it hurts when you empty your bladder (urinate) . Chest pain . Shortness of breath . Pain in the calves of your legs . Severe nausea and vomiting not relieved with anti-nausea medications . Any concerns  What You Can Expect after Surgery . You may see some pink tinged, bloody fluid. This is normal. You may also have cramping for several days.   Activities after Your Discharge Follow these guidelines to help speed your recovery at home: . Don't drive if you are in pain or taking narcotic pain medicine. You may drive when you can safely slam on the brakes, turn the wheel forcefully, and rotate your torso comfortably. This is typically 4-7 days. Practice in a parking lot or side street prior to attempting to drive regularly.  . Ask others to help with household chores for 4 weeks. . Don't do strenuous activities, exercises, or sports like vacuuming, tennis, squash, etc. until your doctor says it is safe to do so. . Walk as you feel able. Rest often since it may take a week or two for your energy level to return to normal.  . You may climb stairs . Avoid constipation:   -Eat fruits, vegetables, and whole grains. Eat small meals as your appetite will take time to return to normal.   -Drink 6 to 8 glasses of water each day unless your doctor has told you to limit your fluids.   -Use a laxative or stool softener as needed if constipation becomes a problem. You may take Miralax, metamucil, Citrucil, Colace, Senekot, FiberCon, etc. If this does not  relieve the constipation, try two tablespoons of Milk Of Magnesia every 8 hours until your bowels move.  . You may shower.  . Do not get in a hot tub, swimming pool, etc. until your doctor agrees. . Do not douche, use tampons, or have sex until your doctor says it is okay, usually about 2 weeks. . Take your pain medicine when you need it. The medicine may not work as well if the pain is bad.  Take the medicines you were taking before surgery. Other medications you might need are pain medications (ibuprofen), medications for constipation (Colace) and nausea medications (Zofran).        AMBULATORY SURGERY  DISCHARGE INSTRUCTIONS   1) The drugs that you were given will stay in your system until tomorrow so for the next 24 hours you should not:  A) Drive an automobile B) Make any legal decisions C) Drink any alcoholic beverage   2) You may resume regular meals tomorrow.  Today it is better to start with liquids and gradually work up to solid foods.  You may eat anything you prefer, but it is better to start with liquids, then soup and crackers, and gradually work up to solid foods.   3) Please notify your doctor immediately if you have any unusual bleeding, trouble breathing, redness and pain at the surgery site, drainage, fever, or pain not relieved by medication.    4) Additional Instructions:          Please contact your physician with any problems or Same Day Surgery at [phone], Monday through Friday 6 am to 4 pm, or [facility] [place] Main number at [phone]. 

## 2018-02-21 NOTE — Progress Notes (Signed)
Pt IV pulled in transfer in OR. Pt does not have an IV now. Pt having some pain 6/10. Dr. Kayleen Memos notified. Acknowledged. Orders received. Laiken Nohr E 9:14 AM 02/21/2018

## 2018-02-21 NOTE — Op Note (Signed)
Operative Report Hysteroscopy with Dilation and Curettage; Novasure ablation   Indications: menometrorrhagia   Pre-operative Diagnosis: endometrial polyp   Post-operative Diagnosis: same.  Procedure: 1. Exam under anesthesia 2. Fractional D&C with endocervical curettage  3. Hysteroscopy 4. Myosure polypectomy 5. Novasure endometrial ablation  Surgeon: Angelina Pih, MD  Assistant(s):  None  Anesthesia: General LMA anesthesia  Anesthesiologist: Alvin Critchley, MD Anesthesiologist: Alvin Critchley, MD CRNA: Eben Burow, CRNA; Hedda Slade, CRNA  Estimated Blood Loss:  Minimal         Intraoperative medications: none         Total IV Fluids: 385ml  Urine Output: 82ml         Specimens: Endocervical curettings, endometrial curettings         Complications:  None; patient tolerated the procedure well.         Disposition: PACU - hemodynamically stable.         Condition: stable  Findings: Uterus measuring 9 cm by sound; normal cervix, vagina, perineum. Cervical length: 4 cm Uterine cavity length: 5 cm Uterine cavity width: 3.7 cm Power in watts: 108 Total time: 1 min 17 sec  Indication for procedure/Consents: 47 y.o.  here for scheduled surgery for the aforementioned diagnoses. SIS found heterogenous endometrial lining with large polyp. Risks of surgery were discussed with the patient including but not limited to: bleeding which may require transfusion; infection which may require antibiotics; injury to uterus or surrounding organs; intrauterine scarring which may impair future fertility; need for additional procedures including laparotomy or laparoscopy; and other postoperative/anesthesia complications. Written informed consent was obtained.    Procedure Details:   The patient was taken to the operating room where LMA anesthesia was administered and was found to be adequate. After a formal and adequate timeout was performed, she was placed in the dorsal  lithotomy position and examined with the above findings. She was then prepped and draped in the sterile manner. Her bladder was catheterized for an estimated amount of clear, yellow urine. A weighed speculum was then placed in the patient's vagina and a single tooth tenaculum was applied to the anterior lip of the cervix.  An endocervical currettage was performed. Her cervix was serially dilated to 15 [place] using Hanks dilators.The hysteroscope was introduced to reveal the above findings.The hysteroscope was also used to determine the level of the internal os, and measurements were confirmed. The uterine cavity was carefully examined, both ostia were recognized, and diffusely proliferative endometrium with polypoid fragments was noted.  A sharp curettage was then performed until there was a gritty texture in all four quadrants.   NOVASURE PROCEDURE DETAILS:   The cervix was further dilated to accommodate the NovaSure device.  The NovaSure device was inserted, and the cavity width was determined. Using a power of 108 watts, for 77 sec, the endometrial ablation was performed. The hysteroscope was not re-introduced into the uterine cavity, to decrease the risk of pelvic infection. The tenaculum was removed from the anterior lip of the cervix, and the vaginal speculum was removed after noting good hemostasis.  She received iv acetaminophen and Toradol prior to leaving the OR. The patient tolerated the procedure well and was taken to the recovery area awake, extubated and in stable condition.  The patient will be discharged to home as per PACU criteria.  Routine postoperative instructions given.  She was prescribed Percocet, Ibuprofen and Colace.  She will follow up in the clinic in two weeks for postoperative evaluation.

## 2018-02-21 NOTE — Anesthesia Procedure Notes (Signed)
Procedure Name: LMA Insertion Date/Time: 02/21/2018 7:37 AM Performed by: Eben Burow, CRNA Pre-anesthesia Checklist: Patient identified, Emergency Drugs available, Suction available, Patient being monitored and Timeout performed Patient Re-evaluated:Patient Re-evaluated prior to induction Oxygen Delivery Method: Circle system utilized Preoxygenation: Pre-oxygenation with 100% oxygen Induction Type: IV induction Ventilation: Mask ventilation without difficulty LMA: LMA inserted LMA Size: 4.0 Number of attempts: 1 Placement Confirmation: positive ETCO2 and breath sounds checked- equal and bilateral Tube secured with: Tape Dental Injury: Teeth and Oropharynx as per pre-operative assessment

## 2018-02-21 NOTE — Interval H&P Note (Signed)
History and Physical Interval Note:  02/21/2018 7:11 AM  Carly [name]  has presented today for surgery, with the diagnosis of menometrorrhagia  The various methods of treatment have been discussed with the patient and family. After consideration of risks, benefits and other options for treatment, the patient has consented to  Procedure(s): HYSTEROSCOPY WITH NOVASURE, POLYPECTOMY (N/A) as a surgical intervention .  The patient's history has been reviewed, patient examined, no change in status, stable for surgery.  I have reviewed the patient's chart and labs.  Questions were answered to the patient's satisfaction.     Benjaman Kindler

## 2018-02-21 NOTE — OR Nursing (Signed)
Discharge instructions discussed with patient and husband Alvester Chou. Both voice understanding.

## 2018-02-21 NOTE — Anesthesia Post-op Follow-up Note (Signed)
Anesthesia QCDR form completed.        

## 2018-02-21 NOTE — Anesthesia Preprocedure Evaluation (Signed)
Anesthesia Evaluation  Patient identified by MRN, date of birth, ID band Patient awake    Reviewed: Allergy & Precautions, NPO status , Patient's Chart, lab work & pertinent test results  Airway Mallampati: II       Dental   Pulmonary COPD, Current Smoker,    Pulmonary exam normal        Cardiovascular hypertension, Normal cardiovascular exam     Neuro/Psych  Headaches, PSYCHIATRIC DISORDERS Anxiety Depression  Neuromuscular disease    GI/Hepatic negative GI ROS, Neg liver ROS,   Endo/Other  negative endocrine ROS  Renal/GU negative Renal ROS  negative genitourinary   Musculoskeletal  (+) Arthritis ,   Abdominal Normal abdominal exam  (+)   Peds negative pediatric ROS (+)  Hematology negative hematology ROS (+)   Anesthesia Other Findings   Reproductive/Obstetrics                             Anesthesia Physical Anesthesia Plan  ASA: II  Anesthesia Plan:    Post-op Pain Management:    Induction: Intravenous  PONV Risk Score and Plan:   Airway Management Planned: LMA  Additional Equipment:   Intra-op Plan:   Post-operative Plan: Extubation in OR  Informed Consent: I have reviewed the patients History and Physical, chart, labs and discussed the procedure including the risks, benefits and alternatives for the proposed anesthesia with the patient or authorized representative who has indicated his/her understanding and acceptance.   Dental advisory given  Plan Discussed with: CRNA and Surgeon  Anesthesia Plan Comments:         Anesthesia Quick Evaluation

## 2018-02-21 NOTE — Transfer of Care (Signed)
Immediate Anesthesia Transfer of Care Note  Patient: Carly [name]  Procedure(s) Performed: HYSTEROSCOPY WITH NOVASURE, POLYPECTOMY (N/A )  Patient Location: PACU  Anesthesia Type:General  Level of Consciousness: awake, alert , oriented and patient cooperative  Airway & Oxygen Therapy: Patient Spontanous Breathing and Patient connected to face mask oxygen  Post-op Assessment: Report given to RN and Post -op Vital signs reviewed and stable  Post vital signs: Reviewed and stable  Last Vitals:  Vitals Value Taken Time  BP 118/95 02/21/2018  8:42 AM  Temp 36.3 C 02/21/2018  8:42 AM  Pulse 98 02/21/2018  8:45 AM  Resp 24 02/21/2018  8:45 AM  SpO2 100 % 02/21/2018  8:45 AM  Vitals shown include unvalidated device data.  Last Pain:  Vitals:   02/21/18 0842  TempSrc:   PainSc: 0-No pain         Complications: No apparent anesthesia complications

## 2018-02-24 NOTE — Anesthesia Postprocedure Evaluation (Signed)
Anesthesia Post Note  Patient: Carly [name]  Procedure(s) Performed: HYSTEROSCOPY WITH NOVASURE, POLYPECTOMY (N/A )  Patient location during evaluation: PACU Anesthesia Type: General Level of consciousness: awake and alert and oriented Pain management: pain level controlled Vital Signs Assessment: post-procedure vital signs reviewed and stable Respiratory status: spontaneous breathing Cardiovascular status: blood pressure returned to baseline Anesthetic complications: no     Last Vitals:  Vitals:   02/21/18 0953 02/21/18 1005  BP: (!) 161/94 (!) 149/82  Pulse: 87   Resp:    Temp: 36.8 C   SpO2: 98% 98%    Last Pain:  Vitals:   02/24/18 0821  TempSrc:   PainSc: 0-No pain                 Ares Tegtmeyer

## 2018-02-25 LAB — SURGICAL PATHOLOGY

## 2018-03-17 ENCOUNTER — Other Ambulatory Visit: Payer: Self-pay | Admitting: Family Medicine

## 2018-03-18 MED ORDER — METOPROLOL SUCCINATE ER 50 MG PO TB24: 100.0000 mg | ORAL_TABLET | Freq: Every day | ORAL | 0 refills | Status: DC

## 2018-04-30 ENCOUNTER — Ambulatory Visit: Payer: BLUE CROSS/BLUE SHIELD | Admitting: Gastroenterology

## 2018-04-30 ENCOUNTER — Encounter: Payer: Self-pay | Admitting: Gastroenterology

## 2018-04-30 DIAGNOSIS — E611 Iron deficiency: Secondary | ICD-10-CM

## 2018-05-26 DIAGNOSIS — S29012A Strain of muscle and tendon of back wall of thorax, initial encounter: Secondary | ICD-10-CM | POA: Diagnosis not present

## 2018-06-18 ENCOUNTER — Other Ambulatory Visit: Payer: Self-pay | Admitting: Family Medicine

## 2018-06-18 MED ORDER — METOPROLOL SUCCINATE ER 50 MG PO TB24: 100.0000 mg | ORAL_TABLET | Freq: Every day | ORAL | 0 refills | Status: DC

## 2018-06-30 ENCOUNTER — Other Ambulatory Visit: Payer: Self-pay

## 2018-06-30 MED ORDER — METOPROLOL SUCCINATE ER 50 MG PO TB24: 100.0000 mg | ORAL_TABLET | Freq: Every day | ORAL | 0 refills | Status: DC

## 2018-07-11 DIAGNOSIS — M25521 Pain in right elbow: Secondary | ICD-10-CM | POA: Diagnosis not present

## 2018-07-11 DIAGNOSIS — M7711 Lateral epicondylitis, right elbow: Secondary | ICD-10-CM | POA: Diagnosis not present

## 2018-07-28 ENCOUNTER — Telehealth: Payer: Self-pay

## 2018-07-28 NOTE — Telephone Encounter (Signed)
Copied from [place] [phone]. Topic: Appointment Scheduling - Scheduling Inquiry for Clinic >> Jul 28, 2018 11:11 AM Conception Chancy, NT wrote: Reason for CRM: patient is calling and states she is needing a follow up on her blood pressure for refills next month and Dr. Caryl Bis first available is 09/24/18. Please advise

## 2018-07-29 NOTE — Telephone Encounter (Signed)
Sent to PCP are you OK with seeing the sooner?

## 2018-07-29 NOTE — Telephone Encounter (Signed)
She could be scheduled for a 430 appointment on a Monday or a Wednesday.

## 2018-07-29 NOTE — Telephone Encounter (Signed)
Patient is requesting Monday 1/20, if possible, at 4:30.

## 2018-07-31 NOTE — Telephone Encounter (Signed)
Scheduled please make sure patient aware.

## 2018-07-31 NOTE — Telephone Encounter (Signed)
Sent to [facility] Please schedule patient for this visit. Will call patient back to confirm with her she has been scheduled. Thanks   appt is for follow on BP

## 2018-07-31 NOTE — Telephone Encounter (Signed)
Called and spoke with patient. Pt advised and aware.

## 2018-08-04 ENCOUNTER — Ambulatory Visit: Payer: BLUE CROSS/BLUE SHIELD | Admitting: Family Medicine

## 2018-08-04 ENCOUNTER — Encounter: Payer: Self-pay | Admitting: Family Medicine

## 2018-08-04 VITALS — BP 148/92 | HR 106 | Temp 98.1°F | Ht 64.0 in | Wt 205.6 lb

## 2018-08-04 DIAGNOSIS — G4700 Insomnia, unspecified: Secondary | ICD-10-CM | POA: Diagnosis not present

## 2018-08-04 DIAGNOSIS — E611 Iron deficiency: Secondary | ICD-10-CM | POA: Diagnosis not present

## 2018-08-04 DIAGNOSIS — F321 Major depressive disorder, single episode, moderate: Secondary | ICD-10-CM | POA: Insufficient documentation

## 2018-08-04 DIAGNOSIS — J441 Chronic obstructive pulmonary disease with (acute) exacerbation: Secondary | ICD-10-CM

## 2018-08-04 DIAGNOSIS — I10 Essential (primary) hypertension: Secondary | ICD-10-CM

## 2018-08-04 HISTORY — DX: Major depressive disorder, single episode, moderate: F32.1

## 2018-08-04 MED ORDER — SERTRALINE HCL 100 MG PO TABS: 150.0000 mg | ORAL_TABLET | Freq: Every day | ORAL | 3 refills | Status: DC

## 2018-08-04 MED ORDER — BUDESONIDE-FORMOTEROL FUMARATE 160-4.5 MCG/ACT IN AERO: 2.0000 | INHALATION_SPRAY | Freq: Two times a day (BID) | RESPIRATORY_TRACT | 6 refills | Status: DC

## 2018-08-04 NOTE — Assessment & Plan Note (Addendum)
New onset.  Discussed referral for therapy.  She will check with her insurance to ensure this is covered though referral was already placed.  We will increase her Zoloft.  We will discuss possible trazodone for sleep with our clinical pharmacist to determine if this is an acceptable medication with her current regimen.  Given return precautions.

## 2018-08-04 NOTE — Patient Instructions (Signed)
Nice to see you. We will contact you once we speak with our clinical pharmacist. We will contact you with your lab results. Please check with your insurance regarding coverage for a therapist.  I have placed a referral. If you develop thoughts of harming yourself please seek medical attention immediately.

## 2018-08-04 NOTE — Progress Notes (Signed)
[name], MD Phone: [phone]  Carly [name] is a 48 y.o. female who presents today for follow-up.  CC: Hypertension, depression, insomnia, iron deficiency  Hypertension: Typically 142/90.  Taking spironolactone, amlodipine, and metoprolol.  No chest pain or shortness of breath.  Depression: Patient notes this has come on since her father-in-law has gotten ill and is in the process of dying.  She notes she is cranky more than usual.  No SI or HI.  She continues on Zoloft though believes it is not providing as much benefit.  She has had trouble sleeping.  She falls asleep okay though she wake up after 3 hours and cannot turn her mind off.  This has started around the time her father-in-law got sick.  Iron deficiency: She underwent an ablation.  She is not having menstrual cycles.  She remains on iron supplementation.  She did not see GI.  Social History   Tobacco Use  Smoking Status Current Every Day Smoker  . Packs/day: 0.50  . Years: 28.00  . Pack years: 14.00  . Types: Cigarettes  Smokeless Tobacco Never Used     ROS see history of present illness  Objective  Physical Exam Vitals:   08/04/18 1626  BP: (!) 148/92  Pulse: (!) 106  Temp: 98.1 F (36.7 C)  SpO2: 98%    BP Readings from Last 3 Encounters:  08/04/18 (!) 148/92  02/21/18 (!) 149/82  02/17/18 140/80   Wt Readings from Last 3 Encounters:  08/04/18 205 lb 9.6 oz (93.3 kg)  02/21/18 192 lb (87.1 kg)  02/17/18 192 lb (87.1 kg)    Physical Exam Constitutional:      General: She is not in acute distress.    Appearance: She is not diaphoretic.  Cardiovascular:     Rate and Rhythm: Normal rate and regular rhythm.     Heart sounds: Normal heart sounds.  Pulmonary:     Effort: Pulmonary effort is normal.     Breath sounds: Normal breath sounds.  Skin:    General: Skin is warm and dry.  Neurological:     Mental Status: She is alert.      Assessment/Plan: Please see individual problem  list.  HTN (hypertension) Uncontrolled.  I will discuss medication with our clinical pharmacist to determine the next best step.  We may need to change her metoprolol to an alternative medication.  Depression, major, single episode, moderate ([place]) New onset.  Discussed referral for therapy.  She will check with her insurance to ensure this is covered though referral was already placed.  We will increase her Zoloft.  We will discuss possible trazodone with our clinical pharmacist to determine if this is an acceptable medication with her current regimen.  Given return precautions.  Iron deficiency Recheck levels.  This may have been related to her menorrhagia.  Discussed likely need to see GI though if her levels are normal and remain normal if we discontinue iron we could monitor.    Orders Placed This Encounter  Procedures  . Basic Metabolic Panel (BMET)  . Iron, TIBC and Ferritin Panel  . CBC  . Ambulatory referral to Psychology    Referral Priority:   Routine    Referral Type:   Psychiatric    Referral Reason:   Specialty Services Required    Requested Specialty:   Psychology    Number of Visits Requested:   1    Meds ordered this encounter  Medications  . budesonide-formoterol (SYMBICORT) 160-4.5 MCG/ACT inhaler  Sig: Inhale 2 puffs into the lungs 2 (two) times daily.    Dispense:  1 Inhaler    Refill:  6  . sertraline (ZOLOFT) 100 MG tablet    Sig: Take 1.5 tablets (150 mg total) by mouth daily.    Dispense:  135 tablet    Refill:  [place], MD [place]

## 2018-08-04 NOTE — Assessment & Plan Note (Signed)
Uncontrolled.  I will discuss medication with our clinical pharmacist to determine the next best step.  We may need to change her metoprolol to an alternative medication.

## 2018-08-04 NOTE — Assessment & Plan Note (Signed)
Recheck levels.  This may have been related to her menorrhagia.  Discussed likely need to see GI though if her levels are normal and remain normal if we discontinue iron we could monitor.

## 2018-08-05 ENCOUNTER — Telehealth: Payer: Self-pay | Admitting: Family Medicine

## 2018-08-05 ENCOUNTER — Other Ambulatory Visit: Payer: Self-pay | Admitting: Family Medicine

## 2018-08-05 DIAGNOSIS — E611 Iron deficiency: Secondary | ICD-10-CM

## 2018-08-05 LAB — IRON,TIBC AND FERRITIN PANEL
%SAT: 18 % (calc) (ref 16–45)
Ferritin: 26 ng/mL (ref 16–232)
Iron: 84 ug/dL (ref 40–190)
TIBC: 480 mcg/dL (calc) — ABNORMAL HIGH (ref 250–450)

## 2018-08-05 LAB — CBC
HCT: 45.8 % (ref 36.0–46.0)
Hemoglobin: 15.3 g/dL — ABNORMAL HIGH (ref 12.0–15.0)
MCHC: 33.3 g/dL (ref 30.0–36.0)
MCV: 91.7 fl (ref 78.0–100.0)
Platelets: 66 10*3/uL — ABNORMAL LOW (ref 150.0–400.0)
RBC: 4.99 Mil/uL (ref 3.87–5.11)
RDW: 15.6 % — ABNORMAL HIGH (ref 11.5–15.5)
WBC: 8.6 10*3/uL (ref 4.0–10.5)

## 2018-08-05 LAB — BASIC METABOLIC PANEL
BUN: 5 mg/dL — ABNORMAL LOW (ref 6–23)
CO2: 22 mEq/L (ref 19–32)
Calcium: 9 mg/dL (ref 8.4–10.5)
Chloride: 104 mEq/L (ref 96–112)
Creatinine, Ser: 0.7 mg/dL (ref 0.40–1.20)
GFR: 89.68 mL/min (ref >60.00)
Glucose, Bld: 96 mg/dL (ref 70–99)
Potassium: 3.5 mEq/L (ref 3.5–5.1)
Sodium: 139 mEq/L (ref 135–145)

## 2018-08-05 NOTE — Telephone Encounter (Signed)
Called and spoke with pt. Pt advised. Pt stated that Dr. Caryl Bis already increased her Zoloft and she is willing to switch to Carvedilol pt would like this sent to [facility] in [place].   Pt wanted to know as well will it be OK for her to take 3 tylenol at night as well with these other medications?

## 2018-08-05 NOTE — Telephone Encounter (Signed)
-----   Message from De Hollingshead, [facility] sent at 08/05/2018  9:40 AM EST ----- Hi  I'd switch to carvedilol 25 mg BID. Laurey Arrow gave a good rule of thumb the other day - as long as the patient isn't on O2 therapy, he doesn't worry about nonselective beta blockers in COPD.   I'd probably recommend giving her a few weeks on the higher sertraline, then if no improvement, add a low dose trazodone, just from the serotonin perspective.   Carly [name] ----- Message ----- From: Leone Haven, MD Sent: 08/04/2018   5:50 PM EST To: De Hollingshead, [facility]  Hey Carly [name],   I have 2 questions regarding this patient.  Her blood pressure has been uncontrolled.  She is on metoprolol which I do not believe provides great blood pressure control.  Would it be worth switching her to carvedilol?  If so is there an equivalent dose to her current metoprolol 100 mg daily dosing?  Would the carvedilol be acceptable given her COPD history?  She is currently having issues with depression and sleep.  I am increasing her Zoloft though wanted to see if trazodone was acceptable to use with Zoloft.  They have seen several psychiatrists that do this though wanted to make sure the Zoloft and trazodone interacted adequately.  Thanks.  Randall Hiss

## 2018-08-05 NOTE — Telephone Encounter (Signed)
Please let the patient know that I heard back from our clinical pharmacist.  She recommended to switch to carvedilol from metoprolol.  If the patient is willing to make this change I can send this to her pharmacy.  She also recommended having the patient try the higher dose of the sertraline for several weeks and if there is not any improvement in her sleep then we could try a low-dose of trazodone.  The patient should contact us in 2 to 3 weeks to let us know how her sleep is doing with the increased dose of sertraline.

## 2018-08-05 NOTE — Telephone Encounter (Signed)
Left message for pt to return call to office for lab results  

## 2018-08-05 NOTE — Telephone Encounter (Signed)
Copied from [place]. Topic: Quick Communication - Lab Results (Clinic Use ONLY) >> Aug 05, 2018 10:49 AM Myriam Forehand, [place] wrote: Called patient to inform them of  lab results. When patient returns call, triage nurse may disclose results.

## 2018-08-06 ENCOUNTER — Telehealth: Payer: Self-pay | Admitting: Family Medicine

## 2018-08-06 MED ORDER — CARVEDILOL 25 MG PO TABS: 25.0000 mg | ORAL_TABLET | Freq: Two times a day (BID) | ORAL | 3 refills | Status: DC

## 2018-08-06 NOTE — Addendum Note (Signed)
Addended by: Leone Haven on: 08/06/2018 02:44 PM   Modules accepted: Orders

## 2018-08-06 NOTE — Telephone Encounter (Signed)
Pt read telephone note of Dr Caryl Bis recorded 08/06/2018. She states that she want to take tylenol simply sleep and was wondering if it was ok to take with her Zoloft. Pt was advised to ask her pharmacist for advice.  Pt also would like Dr Caryl Bis to know that her insurance will cover mental health needs and would like a referral. She verbalized understanding of the instruction for transitioning to her new BP medication. Pt disconnected prior to making nurse appointment for BP and pulse check. Called patient and left VM to call office.

## 2018-08-06 NOTE — Telephone Encounter (Signed)
Called patient and left a VM to call back. CRM created and sent to [facility] pool.  

## 2018-08-06 NOTE — Telephone Encounter (Signed)
Please let the patient know that the carvedilol was sent to her pharmacy.  She should discontinue the metoprolol and then the next day start on carvedilol.  She will need to come to the office in 1 to 2 weeks after starting this for BP check and pulse check.  This can be done with nursing.  Please find out what she needs the Tylenol for.  Please find out what dose of Tylenol she is taking.  Thanks.

## 2018-08-07 ENCOUNTER — Other Ambulatory Visit: Payer: Self-pay | Admitting: Family Medicine

## 2018-08-07 DIAGNOSIS — D696 Thrombocytopenia, unspecified: Secondary | ICD-10-CM

## 2018-08-07 NOTE — Telephone Encounter (Signed)
Called and spoke with patient. Pt advised and voiced understanding. Pt has been scheduled for BP recheck. Pt was taking the tylenol PM for sleep so she is aware not to take this now.

## 2018-08-07 NOTE — Telephone Encounter (Signed)
Sent to PCP ?

## 2018-08-07 NOTE — Telephone Encounter (Signed)
Called and spoke with patient patient advised and voiced understanding.

## 2018-08-07 NOTE — Telephone Encounter (Signed)
Taking regular tylenol is ok with zoloft though if she means tylenol PM then she should not take that to help sleep. She should follow the directions on the over the counter bottle.

## 2018-08-20 ENCOUNTER — Ambulatory Visit (INDEPENDENT_AMBULATORY_CARE_PROVIDER_SITE_OTHER): Payer: BLUE CROSS/BLUE SHIELD

## 2018-08-20 ENCOUNTER — Other Ambulatory Visit (INDEPENDENT_AMBULATORY_CARE_PROVIDER_SITE_OTHER): Payer: BLUE CROSS/BLUE SHIELD

## 2018-08-20 DIAGNOSIS — I10 Essential (primary) hypertension: Secondary | ICD-10-CM | POA: Diagnosis not present

## 2018-08-20 DIAGNOSIS — E611 Iron deficiency: Secondary | ICD-10-CM | POA: Diagnosis not present

## 2018-08-20 LAB — CBC
HCT: 47.7 % — ABNORMAL HIGH (ref 36.0–46.0)
Hemoglobin: 15.9 g/dL — ABNORMAL HIGH (ref 12.0–15.0)
MCHC: 33.3 g/dL (ref 30.0–36.0)
MCV: 91.9 fl (ref 78.0–100.0)
Platelets: 58 10*3/uL — ABNORMAL LOW (ref 150.0–400.0)
RBC: 5.19 Mil/uL — ABNORMAL HIGH (ref 3.87–5.11)
RDW: 15 % (ref 11.5–15.5)
WBC: 7.9 10*3/uL (ref 4.0–10.5)

## 2018-08-20 NOTE — Progress Notes (Signed)
Patient came in for labs and BP check this morning. BP was 136/80 in left arm. Patient is taking medication daily but had not taken it this morning. Patient denied any symptoms. Advised to monitor BP and follow with PCP as directed.

## 2018-08-20 NOTE — Progress Notes (Signed)
BP adequately controlled given that she had not taken her medication. She needs to continue her current regimen and return for a BP check in one month when she has taken her medication.

## 2018-08-21 LAB — IRON,TIBC AND FERRITIN PANEL
%SAT: 24 % (calc) (ref 16–45)
Ferritin: 47 ng/mL (ref 16–232)
Iron: 113 ug/dL (ref 40–190)
TIBC: 477 mcg/dL (calc) — ABNORMAL HIGH (ref 250–450)

## 2018-08-22 NOTE — Progress Notes (Signed)
Carly [name]  [phone] [phone]  ID: Lin Landsman OB: [birth year]  MR#: [id number]  YJE#:[id number]  Patient Care Team: Leone Haven, MD as PCP - General (Family Medicine)  CHIEF COMPLAINT: Thrombocytopenia.  INTERVAL HISTORY: Patient is a 48 year old female who was last evaluated in clinic in June 2017.  She is referred back for further evaluation of her chronic thrombocytopenia.  She currently feels well and is asymptomatic.  She denies any easy bleeding or bruising.  She has no neurologic complaints.  She denies any recent fevers or illnesses.  She has a good appetite and denies weight loss.  She has no chest pain or shortness of breath.  She denies any nausea, vomiting, constipation, or diarrhea.  She has no urinary complaints.  Patient feels at her baseline offers no specific complaints today.  REVIEW OF SYSTEMS:   Review of Systems  Constitutional: Negative.  Negative for fever, malaise/fatigue and weight loss.  Respiratory: Negative.  Negative for cough, hemoptysis and shortness of breath.   Cardiovascular: Negative.  Negative for chest pain and leg swelling.  Gastrointestinal: Negative.  Negative for abdominal pain, blood in stool and melena.  Genitourinary: Negative.  Negative for hematuria.  Musculoskeletal: Negative.   Skin: Negative.  Negative for rash.  Neurological: Negative.  Negative for dizziness, focal weakness, weakness and headaches.  Psychiatric/Behavioral: Negative.  The patient is not nervous/anxious.     As per HPI. Otherwise, a complete review of systems is negative.  PAST MEDICAL HISTORY: Past Medical History:  Diagnosis Date  . Anxiety   . Chicken pox   . Depression   . Elevated testosterone level in female   . Emphysema of lung (HCC)    bronchitis  . Frequent headaches   . Hay fever   . Hypertension     PAST SURGICAL HISTORY: Past Surgical History:  Procedure Laterality Date  . HYSTEROSCOPY WITH  NOVASURE N/A 02/21/2018   Procedure: HYSTEROSCOPY WITH NOVASURE, POLYPECTOMY;  Surgeon: Benjaman Kindler, MD;  Location: [facility] ORS;  Service: Gynecology;  Laterality: N/A;  . TUBAL LIGATION  1997  . TUBAL LIGATION      FAMILY HISTORY: Family History  Problem Relation Age of Onset  . Hyperlipidemia Mother   . Heart disease Mother   . Stroke Mother   . Depression Mother   . Mental illness Mother   . Heart attack Mother 67  . Hyperlipidemia Father   . Depression Father   . Mental illness Father   . Diabetes Paternal Grandfather   . Cancer Cousin     ADVANCED DIRECTIVES (Y/N):  N  HEALTH MAINTENANCE: Social History   Tobacco Use  . Smoking status: Current Every Day Smoker    Packs/day: 0.50    Years: 28.00    Pack years: 14.00    Types: Cigarettes  . Smokeless tobacco: Never Used  Substance Use Topics  . Alcohol use: Yes    Alcohol/week: 8.0 standard drinks    Types: 8 Standard drinks or equivalent per week    Comment: Beer (Can)   . Drug use: No    Comment: CBD oil     Colonoscopy:  PAP:  Bone density:  Lipid panel:  Allergies  Allergen Reactions  . Meloxicam Nausea Only    Current Outpatient Medications  Medication Sig Dispense Refill  . ALPRAZolam (XANAX) 0.5 MG tablet Take 0.5 mg by mouth at bedtime as needed for anxiety.    Marland Kitchen amLODipine (NORVASC) 10 MG tablet Take 1 tablet (10  mg total) by mouth daily. 90 tablet 3  . budesonide-formoterol (SYMBICORT) 160-4.5 MCG/ACT inhaler Inhale 2 puffs into the lungs 2 (two) times daily. 1 Inhaler 6  . carvedilol (COREG) 25 MG tablet Take 1 tablet (25 mg total) by mouth 2 (two) times daily with a meal. 60 tablet 3  . ibuprofen (ADVIL,MOTRIN) 200 MG tablet Take 400-600 mg by mouth every 6 (six) hours as needed for headache.    Marland Kitchen OVER THE COUNTER MEDICATION Take 2 drops by mouth at bedtime. CBD Oil - takes for sleep    . sertraline (ZOLOFT) 100 MG tablet Take 1.5 tablets (150 mg total) by mouth daily. 135 tablet 3  .  spironolactone (ALDACTONE) 25 MG tablet Take 1 tablet (25 mg total) by mouth daily. 90 tablet 1  . vitamin B-12 (CYANOCOBALAMIN) 1000 MCG tablet Take 1,000 mcg by mouth daily.    . ferrous sulfate 325 (65 FE) MG tablet Take by mouth.     No current facility-administered medications for this visit.     OBJECTIVE: Vitals:   08/26/18 1520  BP: (!) 147/89  Pulse: 90  Temp: 98.7 F (37.1 C)     Body mass index is 34.36 kg/m.    ECOG FS:0 - Asymptomatic  General: Well-developed, well-nourished, no acute distress. Eyes: Pink conjunctiva, anicteric sclera. HEENT: Normocephalic, moist mucous membranes, clear oropharnyx. Lungs: Clear to auscultation bilaterally. Heart: Regular rate and rhythm. No rubs, murmurs, or gallops. Abdomen: Soft, nontender, nondistended. No organomegaly noted, normoactive bowel sounds. Musculoskeletal: No edema, cyanosis, or clubbing. Neuro: Alert, answering all questions appropriately. Cranial nerves grossly intact. Skin: No rashes or petechiae noted. Psych: Normal affect. Lymphatics: No cervical, calvicular, axillary or inguinal LAD.   LAB RESULTS:  Lab Results  Component Value Date   NA 139 08/04/2018   K 3.5 08/04/2018   CL 104 08/04/2018   CO2 22 08/04/2018   GLUCOSE 96 08/04/2018   BUN 5 (L) 08/04/2018   CREATININE 0.70 08/04/2018   CALCIUM 9.0 08/04/2018   PROT 6.8 09/02/2017   ALBUMIN 3.8 09/02/2017   AST 23 09/02/2017   ALT 13 09/02/2017   ALKPHOS 66 09/02/2017   BILITOT 0.6 09/02/2017   GFRNONAA >60 02/14/2018   GFRAA >60 02/14/2018    Lab Results  Component Value Date   WBC 13.8 (H) 08/26/2018   NEUTROABS 9.5 (H) 08/26/2018   HGB 15.4 (H) 08/26/2018   HCT 46.8 (H) 08/26/2018   MCV 89.7 08/26/2018   PLT 35 (L) 08/26/2018     STUDIES: No results found.  ASSESSMENT: Thrombocytopenia  PLAN:    1. Thrombocytopenia: Patient's platelet count is decreased to 35, which appears to be slightly lower than her baseline.  Previously  all of her other laboratory work including iron stores, hemolysis labs, antiplatelet antibodies, and ANA were either negative or within normal limits.  CT scan on March 04, 2017 did not reveal splenomegaly.  The most likely diagnosis is ITP, but this would take a bone marrow to confirm.  Plan to repeat laboratory work prior to next clinic visit in 1 month.  If everything continues to remain negative, will readdress the possibility of a bone marrow biopsy.  I spent a total of 30 minutes face-to-face with the patient of which greater than 50% of the visit was spent in counseling and coordination of care as detailed above.   Patient expressed understanding and was in agreement with this plan. She also understands that She can call clinic at any time with any questions,  concerns, or complaints.    Lloyd Huger, MD   08/28/2018 5:51 PM

## 2018-08-26 ENCOUNTER — Inpatient Hospital Stay: Payer: BLUE CROSS/BLUE SHIELD | Attending: Oncology | Admitting: Oncology

## 2018-08-26 ENCOUNTER — Other Ambulatory Visit: Payer: Self-pay

## 2018-08-26 ENCOUNTER — Inpatient Hospital Stay: Payer: BLUE CROSS/BLUE SHIELD

## 2018-08-26 VITALS — BP 147/89 | HR 90 | Temp 98.7°F | Ht 64.0 in | Wt 200.2 lb

## 2018-08-26 DIAGNOSIS — N939 Abnormal uterine and vaginal bleeding, unspecified: Secondary | ICD-10-CM

## 2018-08-26 DIAGNOSIS — Z79899 Other long term (current) drug therapy: Secondary | ICD-10-CM | POA: Diagnosis not present

## 2018-08-26 DIAGNOSIS — D696 Thrombocytopenia, unspecified: Secondary | ICD-10-CM

## 2018-08-26 DIAGNOSIS — F17210 Nicotine dependence, cigarettes, uncomplicated: Secondary | ICD-10-CM | POA: Diagnosis not present

## 2018-08-26 DIAGNOSIS — I10 Essential (primary) hypertension: Secondary | ICD-10-CM | POA: Diagnosis not present

## 2018-08-26 DIAGNOSIS — Z791 Long term (current) use of non-steroidal anti-inflammatories (NSAID): Secondary | ICD-10-CM | POA: Diagnosis not present

## 2018-08-26 HISTORY — DX: Abnormal uterine and vaginal bleeding, unspecified: N93.9

## 2018-08-26 LAB — CBC WITH DIFFERENTIAL/PLATELET
Abs Immature Granulocytes: 0.06 10*3/uL (ref 0.00–0.07)
Basophils Absolute: 0 10*3/uL (ref 0.0–0.1)
Basophils Relative: 0 %
Eosinophils Absolute: 0 10*3/uL (ref 0.0–0.5)
Eosinophils Relative: 0 %
HCT: 46.8 % — ABNORMAL HIGH (ref 36.0–46.0)
Hemoglobin: 15.4 g/dL — ABNORMAL HIGH (ref 12.0–15.0)
Immature Granulocytes: 0 %
Lymphocytes Relative: 22 %
Lymphs Abs: 3.1 10*3/uL (ref 0.7–4.0)
MCH: 29.5 pg (ref 26.0–34.0)
MCHC: 32.9 g/dL (ref 30.0–36.0)
MCV: 89.7 fL (ref 80.0–100.0)
Monocytes Absolute: 1.1 10*3/uL — ABNORMAL HIGH (ref 0.1–1.0)
Monocytes Relative: 8 %
Neutro Abs: 9.5 10*3/uL — ABNORMAL HIGH (ref 1.7–7.7)
Neutrophils Relative %: 70 %
Platelets: 35 10*3/uL — ABNORMAL LOW (ref 150–400)
RBC: 5.22 MIL/uL — ABNORMAL HIGH (ref 3.87–5.11)
RDW: 14.1 % (ref 11.5–15.5)
WBC: 13.8 10*3/uL — ABNORMAL HIGH (ref 4.0–10.5)
nRBC: 0 % (ref 0.0–0.2)

## 2018-08-26 NOTE — Progress Notes (Signed)
Patient is here today to establish care for thrombocytopenia. Patient stated that she had been doing well.

## 2018-08-27 ENCOUNTER — Ambulatory Visit: Payer: BLUE CROSS/BLUE SHIELD | Admitting: Psychology

## 2018-08-27 ENCOUNTER — Telehealth: Payer: Self-pay

## 2018-08-27 DIAGNOSIS — F331 Major depressive disorder, recurrent, moderate: Secondary | ICD-10-CM

## 2018-08-27 NOTE — Telephone Encounter (Signed)
Called pt to schedule 1 month BP check. Make sure pt takes medication prior to nurse visit

## 2018-09-02 ENCOUNTER — Other Ambulatory Visit: Payer: Self-pay | Admitting: Family Medicine

## 2018-09-02 ENCOUNTER — Encounter: Payer: Self-pay | Admitting: Oncology

## 2018-09-02 DIAGNOSIS — E611 Iron deficiency: Secondary | ICD-10-CM

## 2018-09-11 ENCOUNTER — Ambulatory Visit: Payer: BLUE CROSS/BLUE SHIELD | Admitting: Psychology

## 2018-09-11 DIAGNOSIS — F331 Major depressive disorder, recurrent, moderate: Secondary | ICD-10-CM

## 2018-09-16 ENCOUNTER — Ambulatory Visit: Payer: BLUE CROSS/BLUE SHIELD | Admitting: *Deleted

## 2018-09-16 ENCOUNTER — Other Ambulatory Visit (INDEPENDENT_AMBULATORY_CARE_PROVIDER_SITE_OTHER): Payer: BLUE CROSS/BLUE SHIELD

## 2018-09-16 VITALS — BP 120/80 | HR 97

## 2018-09-16 DIAGNOSIS — E611 Iron deficiency: Secondary | ICD-10-CM | POA: Diagnosis not present

## 2018-09-16 DIAGNOSIS — I10 Essential (primary) hypertension: Secondary | ICD-10-CM

## 2018-09-16 NOTE — Progress Notes (Signed)
Pt presented today for a BP check, states she is doing well.  Gae Bon, CMA

## 2018-09-16 NOTE — Addendum Note (Signed)
Addended by: Leeanne Rio on: 09/16/2018 03:17 PM   Modules accepted: Orders

## 2018-09-17 LAB — IRON,TIBC AND FERRITIN PANEL
%SAT: 17 % (calc) (ref 16–45)
Ferritin: 50 ng/mL (ref 16–232)
Iron: 73 ug/dL (ref 40–190)
TIBC: 433 mcg/dL (calc) (ref 250–450)

## 2018-09-17 LAB — CBC
HCT: 45.2 % — ABNORMAL HIGH (ref 35.0–45.0)
Hemoglobin: 15.4 g/dL (ref 11.7–15.5)
MCH: 31 pg (ref 27.0–33.0)
MCHC: 34.1 g/dL (ref 32.0–36.0)
MCV: 90.9 fL (ref 80.0–100.0)
Platelets: 40 10*3/uL — ABNORMAL LOW (ref 140–400)
RBC: 4.97 10*6/uL (ref 3.80–5.10)
RDW: 13.5 % (ref 11.0–15.0)
WBC: 8 10*3/uL (ref 3.8–10.8)

## 2018-09-18 ENCOUNTER — Encounter: Payer: Self-pay | Admitting: Oncology

## 2018-09-22 ENCOUNTER — Other Ambulatory Visit: Payer: Self-pay | Admitting: Family Medicine

## 2018-09-22 DIAGNOSIS — R1011 Right upper quadrant pain: Secondary | ICD-10-CM | POA: Diagnosis not present

## 2018-09-23 ENCOUNTER — Ambulatory Visit: Payer: BLUE CROSS/BLUE SHIELD

## 2018-09-25 ENCOUNTER — Ambulatory Visit: Payer: BLUE CROSS/BLUE SHIELD | Admitting: Psychology

## 2018-09-29 NOTE — Progress Notes (Signed)
[place]  Telephone:([phone] [phone]) [phone]  ID: Lin Landsman OB: 1971-06-08  MR#: [id number]  ZDG#:[id number]  Patient Care Team: Leone Haven, MD as PCP - General (Family Medicine)  CHIEF COMPLAINT: Thrombocytopenia.  INTERVAL HISTORY: Patient returns to clinic today for repeat laboratory work and further evaluation.  She continues to feel well and remains asymptomatic.  She denies any easy bleeding or bruising.  She has no neurologic complaints.  She denies any recent fevers or illnesses.  She has a good appetite and denies weight loss.  She has no chest pain or shortness of breath.  She denies any nausea, vomiting, constipation, or diarrhea.  She has no urinary complaints.  Patient feels at her baseline offers no specific complaints today.  REVIEW OF SYSTEMS:   Review of Systems  Constitutional: Negative.  Negative for fever, malaise/fatigue and weight loss.  Respiratory: Negative.  Negative for cough, hemoptysis and shortness of breath.   Cardiovascular: Negative.  Negative for chest pain and leg swelling.  Gastrointestinal: Negative.  Negative for abdominal pain, blood in stool and melena.  Genitourinary: Negative.  Negative for hematuria.  Musculoskeletal: Negative.   Skin: Negative.  Negative for rash.  Neurological: Negative.  Negative for dizziness, focal weakness, weakness and headaches.  Psychiatric/Behavioral: Negative.  The patient is not nervous/anxious.     As per HPI. Otherwise, a complete review of systems is negative.  PAST MEDICAL HISTORY: Past Medical History:  Diagnosis Date  . Anxiety   . Chicken pox   . Depression   . Elevated testosterone level in female   . Emphysema of lung (HCC)    bronchitis  . Frequent headaches   . Hay fever   . Hypertension     PAST SURGICAL HISTORY: Past Surgical History:  Procedure Laterality Date  . HYSTEROSCOPY WITH NOVASURE N/A 02/21/2018   Procedure: HYSTEROSCOPY WITH NOVASURE,  POLYPECTOMY;  Surgeon: Benjaman Kindler, MD;  Location: [facility] ORS;  Service: Gynecology;  Laterality: N/A;  . TUBAL LIGATION  1997  . TUBAL LIGATION      FAMILY HISTORY: Family History  Problem Relation Age of Onset  . Hyperlipidemia Mother   . Heart disease Mother   . Stroke Mother   . Depression Mother   . Mental illness Mother   . Heart attack Mother 40  . Hyperlipidemia Father   . Depression Father   . Mental illness Father   . Diabetes Paternal Grandfather   . Cancer Cousin     ADVANCED DIRECTIVES (Y/N):  N  HEALTH MAINTENANCE: Social History   Tobacco Use  . Smoking status: Current Every Day Smoker    Packs/day: 0.50    Years: 28.00    Pack years: 14.00    Types: Cigarettes  . Smokeless tobacco: Never Used  Substance Use Topics  . Alcohol use: Yes    Alcohol/week: 8.0 standard drinks    Types: 8 Standard drinks or equivalent per week    Comment: Beer ([place])   . Drug use: No    Comment: CBD oil     Colonoscopy:  PAP:  Bone density:  Lipid panel:  Allergies  Allergen Reactions  . Meloxicam Nausea Only    Current Outpatient Medications  Medication Sig Dispense Refill  . amLODipine (NORVASC) 10 MG tablet Take 1 tablet (10 mg total) by mouth daily. 90 tablet 3  . budesonide-formoterol (SYMBICORT) 160-4.5 MCG/ACT inhaler Inhale 2 puffs into the lungs 2 (two) times daily. 1 Inhaler 6  . carvedilol (COREG) 25  MG tablet Take 1 tablet (25 mg total) by mouth 2 (two) times daily with a meal. 60 tablet 3  . ibuprofen (ADVIL,MOTRIN) 200 MG tablet Take 400-600 mg by mouth every 6 (six) hours as needed for headache.    Marland Kitchen OVER THE COUNTER MEDICATION Take 2 drops by mouth at bedtime. CBD Oil - takes for sleep    . sertraline (ZOLOFT) 100 MG tablet Take 1.5 tablets (150 mg total) by mouth daily. 135 tablet 3  . spironolactone (ALDACTONE) 25 MG tablet Take 1 tablet (25 mg total) by mouth daily. 90 tablet 1  . vitamin B-12 (CYANOCOBALAMIN) 1000 MCG tablet Take 1,000 mcg by  mouth daily.     No current facility-administered medications for this visit.     OBJECTIVE: Vitals:   09/30/18 1450  BP: 129/83  Pulse: 87  Resp: 18  Temp: 97.7 F (36.5 C)     Body mass index is 34.37 kg/m.    ECOG FS:0 - Asymptomatic  General: Well-developed, well-nourished, no acute distress. Eyes: Pink conjunctiva, anicteric sclera. HEENT: Normocephalic, moist mucous membranes. Lungs: Clear to auscultation bilaterally. Heart: Regular rate and rhythm. No rubs, murmurs, or gallops. Abdomen: Soft, nontender, nondistended. No organomegaly noted, normoactive bowel sounds. Musculoskeletal: No edema, cyanosis, or clubbing. Neuro: Alert, answering all questions appropriately. Cranial nerves grossly intact. Skin: No rashes or petechiae noted. Psych: Normal affect.  LAB RESULTS:  Lab Results  Component Value Date   NA 136 09/30/2018   K 3.3 (L) 09/30/2018   CL 105 09/30/2018   CO2 21 (L) 09/30/2018   GLUCOSE 92 09/30/2018   BUN 8 09/30/2018   CREATININE 0.70 09/30/2018   CALCIUM 8.2 (L) 09/30/2018   PROT 7.1 09/30/2018   ALBUMIN 3.9 09/30/2018   AST 38 09/30/2018   ALT 30 09/30/2018   ALKPHOS 82 09/30/2018   BILITOT 0.7 09/30/2018   GFRNONAA >60 09/30/2018   GFRAA >60 09/30/2018    Lab Results  Component Value Date   WBC 8.3 09/30/2018   NEUTROABS 5.4 09/30/2018   HGB 14.6 09/30/2018   HCT 43.3 09/30/2018   MCV 91.9 09/30/2018   PLT 56 (L) 09/30/2018     STUDIES: No results found.  ASSESSMENT: Thrombocytopenia, possibly ITP  PLAN:    1. Thrombocytopenia: Patient's platelet count has mildly improved to 56.  Patient noted to have a mild B12 deficiency as well as a positive ANA, but neither are likely clinically significant and unrelated to her thrombocytopenia.  CT scan on March 04, 2017 did not reveal splenomegaly.  The most likely diagnosis is ITP and patient has agreed to a bone marrow biopsy but wishes to wait until mid April.  Return to clinic 1 week  after biopsy to discuss the results and further evaluation. 2.  Positive ANA: Will send a referral to rheumatology for further evaluation.  I spent a total of 30 minutes face-to-face with the patient of which greater than 50% of the visit was spent in counseling and coordination of care as detailed above.   Patient expressed understanding and was in agreement with this plan. She also understands that She can call clinic at any time with any questions, concerns, or complaints.    Lloyd Huger, MD   10/01/2018 2:16 PM

## 2018-09-30 ENCOUNTER — Inpatient Hospital Stay: Payer: BLUE CROSS/BLUE SHIELD | Attending: Oncology | Admitting: Oncology

## 2018-09-30 ENCOUNTER — Inpatient Hospital Stay: Payer: BLUE CROSS/BLUE SHIELD

## 2018-09-30 ENCOUNTER — Other Ambulatory Visit: Payer: Self-pay

## 2018-09-30 VITALS — BP 129/83 | HR 87 | Temp 97.7°F | Resp 18 | Wt 200.2 lb

## 2018-09-30 DIAGNOSIS — D696 Thrombocytopenia, unspecified: Secondary | ICD-10-CM | POA: Diagnosis not present

## 2018-09-30 DIAGNOSIS — F17210 Nicotine dependence, cigarettes, uncomplicated: Secondary | ICD-10-CM

## 2018-09-30 DIAGNOSIS — R760 Raised antibody titer: Secondary | ICD-10-CM

## 2018-09-30 LAB — CBC WITH DIFFERENTIAL/PLATELET
Abs Immature Granulocytes: 0.02 10*3/uL (ref 0.00–0.07)
Basophils Absolute: 0 10*3/uL (ref 0.0–0.1)
Basophils Relative: 1 %
Eosinophils Absolute: 0.2 10*3/uL (ref 0.0–0.5)
Eosinophils Relative: 2 %
HCT: 43.3 % (ref 36.0–46.0)
Hemoglobin: 14.6 g/dL (ref 12.0–15.0)
Immature Granulocytes: 0 %
Lymphocytes Relative: 25 %
Lymphs Abs: 2.1 10*3/uL (ref 0.7–4.0)
MCH: 31 pg (ref 26.0–34.0)
MCHC: 33.7 g/dL (ref 30.0–36.0)
MCV: 91.9 fL (ref 80.0–100.0)
Monocytes Absolute: 0.6 10*3/uL (ref 0.1–1.0)
Monocytes Relative: 7 %
Neutro Abs: 5.4 10*3/uL (ref 1.7–7.7)
Neutrophils Relative %: 65 %
Platelets: 56 10*3/uL — ABNORMAL LOW (ref 150–400)
RBC: 4.71 MIL/uL (ref 3.87–5.11)
RDW: 13.6 % (ref 11.5–15.5)
WBC: 8.3 10*3/uL (ref 4.0–10.5)
nRBC: 0 % (ref 0.0–0.2)

## 2018-09-30 LAB — COMPREHENSIVE METABOLIC PANEL
ALT: 30 U/L (ref 0–44)
AST: 38 U/L (ref 15–41)
Albumin: 3.9 g/dL (ref 3.5–5.0)
Alkaline Phosphatase: 82 U/L (ref 38–126)
Anion gap: 10 (ref 5–15)
BUN: 8 mg/dL (ref 6–20)
CO2: 21 mmol/L — ABNORMAL LOW (ref 22–32)
Calcium: 8.2 mg/dL — ABNORMAL LOW (ref 8.9–10.3)
Chloride: 105 mmol/L (ref 98–111)
Creatinine, Ser: 0.7 mg/dL (ref 0.44–1.00)
GFR calc Af Amer: >60 mL/min (ref >60)
GFR calc non Af Amer: >60 mL/min (ref >60)
Glucose, Bld: 92 mg/dL (ref 70–99)
Potassium: 3.3 mmol/L — ABNORMAL LOW (ref 3.5–5.1)
Sodium: 136 mmol/L (ref 135–145)
Total Bilirubin: 0.7 mg/dL (ref 0.3–1.2)
Total Protein: 7.1 g/dL (ref 6.5–8.1)

## 2018-09-30 LAB — FOLATE: Folate: 8.2 ng/mL (ref >5.9)

## 2018-09-30 LAB — VITAMIN B12: Vitamin B-12: 169 pg/mL — ABNORMAL LOW (ref 180–914)

## 2018-09-30 LAB — LACTATE DEHYDROGENASE: LDH: 157 U/L (ref 98–192)

## 2018-09-30 NOTE — Progress Notes (Signed)
Patient offers no complaints today. 

## 2018-10-01 ENCOUNTER — Telehealth: Payer: BLUE CROSS/BLUE SHIELD | Admitting: Family

## 2018-10-01 DIAGNOSIS — J069 Acute upper respiratory infection, unspecified: Secondary | ICD-10-CM

## 2018-10-01 DIAGNOSIS — R05 Cough: Secondary | ICD-10-CM | POA: Diagnosis not present

## 2018-10-01 DIAGNOSIS — R059 Cough, unspecified: Secondary | ICD-10-CM

## 2018-10-01 LAB — PLATELET ANTIBODY PROFILE
Glycoprotein IV Antibody: NEGATIVE
HLA Ab Ser Ql EIA: NEGATIVE
IA/IIA Antibody: NEGATIVE
IB/IX Antibody: NEGATIVE
IIB/IIIA Antibody: NEGATIVE

## 2018-10-01 LAB — PROTEIN ELECTROPHORESIS, SERUM
A/G Ratio: 1.3 (ref 0.7–1.7)
Albumin ELP: 3.6 g/dL (ref 2.9–4.4)
Alpha-1-Globulin: 0.2 g/dL (ref 0.0–0.4)
Alpha-2-Globulin: 0.8 g/dL (ref 0.4–1.0)
Beta Globulin: 1.1 g/dL (ref 0.7–1.3)
Gamma Globulin: 0.7 g/dL (ref 0.4–1.8)
Globulin, Total: 2.8 g/dL (ref 2.2–3.9)
Total Protein ELP: 6.4 g/dL (ref 6.0–8.5)

## 2018-10-01 LAB — HAPTOGLOBIN: Haptoglobin: 289 mg/dL (ref 42–296)

## 2018-10-01 LAB — ENA+DNA/DS+SJORGEN'S
ENA SM Ab Ser-aCnc: <0.2 AI (ref 0.0–0.9)
Ribonucleic Protein: 1.3 AI — ABNORMAL HIGH (ref 0.0–0.9)
SSA (Ro) (ENA) Antibody, IgG: <0.2 AI (ref 0.0–0.9)
SSB (La) (ENA) Antibody, IgG: <0.2 AI (ref 0.0–0.9)
ds DNA Ab: 2 IU/mL (ref 0–9)

## 2018-10-01 LAB — ANA W/REFLEX: Anti Nuclear Antibody(ANA): POSITIVE — AB

## 2018-10-01 MED ORDER — BENZONATATE 100 MG PO CAPS: 100.0000 mg | ORAL_CAPSULE | Freq: Two times a day (BID) | ORAL | 0 refills | Status: DC | PRN

## 2018-10-01 MED ORDER — PREDNISONE 10 MG (21) PO TBPK: ORAL_TABLET | ORAL | 0 refills | Status: DC

## 2018-10-01 NOTE — Progress Notes (Signed)

## 2018-10-02 ENCOUNTER — Telehealth: Payer: Self-pay | Admitting: *Deleted

## 2018-10-02 LAB — COMP PANEL: LEUKEMIA/LYMPHOMA

## 2018-10-02 NOTE — Telephone Encounter (Signed)
Patient notified of lab results, positive ANA. Patient aware that referral has been made to rheumatology for further evaluation. [facility] is currently pushing all new patient appointments out 4 weeks. Patient and Dr. Grayland Ormond aware of expected time from for new patient appointment.

## 2018-10-07 ENCOUNTER — Ambulatory Visit: Payer: BLUE CROSS/BLUE SHIELD | Admitting: Psychology

## 2018-10-08 ENCOUNTER — Telehealth: Payer: Self-pay | Admitting: Family Medicine

## 2018-10-08 MED ORDER — ALBUTEROL SULFATE (2.5 MG/3ML) 0.083% IN NEBU: 2.5000 mg | INHALATION_SOLUTION | Freq: Four times a day (QID) | RESPIRATORY_TRACT | 1 refills | Status: DC | PRN

## 2018-10-08 NOTE — Telephone Encounter (Signed)
Copied from [place] [id number]. Topic: Quick Communication - Rx Refill/Question >> Oct 08, 2018  9:18 AM Reyne Dumas L wrote: Medication: nebulizer solution  Pt called and left voicemail on [facility] mailbox requesting solution for home nebulizer. States she had an e-visit recently and has COPD complicated with bronchitis. Pt states she can be reached at [phone].

## 2018-10-08 NOTE — Telephone Encounter (Signed)
Rx was sent into [facility]. Called pt and left a VM that RX was sent to [facility].

## 2018-10-16 ENCOUNTER — Telehealth: Payer: Self-pay | Admitting: *Deleted

## 2018-10-16 NOTE — Telephone Encounter (Signed)
Patient notified of appointment on 4/15 for bone marrow biopsy. Patient requests appointment be moved out to late April due to her husbands current illness. I have contacted Marcie Bal in scheduling to move out appointment.

## 2018-10-24 ENCOUNTER — Ambulatory Visit: Payer: BLUE CROSS/BLUE SHIELD | Admitting: Psychology

## 2018-10-29 ENCOUNTER — Ambulatory Visit: Payer: BLUE CROSS/BLUE SHIELD

## 2018-10-29 ENCOUNTER — Telehealth: Payer: BLUE CROSS/BLUE SHIELD | Admitting: Family

## 2018-10-29 DIAGNOSIS — M5440 Lumbago with sciatica, unspecified side: Secondary | ICD-10-CM

## 2018-10-29 MED ORDER — BACLOFEN 10 MG PO TABS: 10.0000 mg | ORAL_TABLET | Freq: Three times a day (TID) | ORAL | 0 refills | Status: DC

## 2018-10-29 NOTE — Progress Notes (Signed)
We are sorry that you are not feeling well.  Here is how we plan to help!  Based on what you have shared with me it looks like you mostly have acute back pain.  Acute back pain is defined as musculoskeletal pain that can resolve in 1-3 weeks with conservative treatment.  I have prescribed  Baclofen 10 mg every eight hours as needed which is a muscle relaxer. You can also take motrin OTC every  Hours.  Some patients experience stomach irritation or in increased heartburn with anti-inflammatory drugs.  Please keep in mind that muscle relaxer's can cause fatigue and should not be taken while at work or driving.  Back pain is very common.  The pain often gets better over time.  The cause of back pain is usually not dangerous.  Most people can learn to manage their back pain on their own.  Approximately 5 minutes was spent documenting and reviewing patient's chart.    Home Care  Stay active.  Start with short walks on flat ground if you can.  Try to walk farther each day.  Do not sit, drive or stand in one place for more than 30 minutes.  Do not stay in bed.  Do not avoid exercise or work.  Activity can help your back heal faster.  Be careful when you bend or lift an object.  Bend at your knees, keep the object close to you, and do not twist.  Sleep on a firm mattress.  Lie on your side, and bend your knees.  If you lie on your back, put a pillow under your knees.  Only take medicines as told by your doctor.  Put ice on the injured area.  Put ice in a plastic bag  Place a towel between your skin and the bag  Leave the ice on for 15-20 minutes, 3-4 times a day for the first 2-3 days. 210 After that, you can switch between ice and heat packs.  Ask your doctor about back exercises or massage.  Avoid feeling anxious or stressed.  Find good ways to deal with stress, such as exercise.  Get Help Right Way If:  Your pain does not go away with rest or medicine.  Your pain does not go away  in 1 week.  You have new problems.  You do not feel well.  The pain spreads into your legs.  You cannot control when you poop (bowel movement) or pee (urinate)  You feel sick to your stomach (nauseous) or throw up (vomit)  You have belly (abdominal) pain.  You feel like you may pass out (faint).  If you develop a fever.  Make Sure you:  Understand these instructions.  Will watch your condition  Will get help right away if you are not doing well or get worse.  Your e-visit answers were reviewed by a board certified advanced clinical practitioner to complete your personal care plan.  Depending on the condition, your plan could have included both over the counter or prescription medications.  If there is a problem please reply  once you have received a response from your provider.  Your safety is important to [place].  If you have drug allergies check your prescription carefully.    You can use MyChart to ask questions about today's visit, request a non-urgent call back, or ask for a work or school excuse for 24 hours related to this e-Visit. If it has been greater than 24 hours you will need to follow  up with your provider, or enter a new e-Visit to address those concerns.  You will get an e-mail in the next two days asking about your experience.  I hope that your e-visit has been valuable and will speed your recovery. Thank you for using e-visits.

## 2018-11-07 ENCOUNTER — Telehealth: Payer: Self-pay

## 2018-11-07 ENCOUNTER — Ambulatory Visit (INDEPENDENT_AMBULATORY_CARE_PROVIDER_SITE_OTHER): Payer: BLUE CROSS/BLUE SHIELD | Admitting: Psychology

## 2018-11-07 DIAGNOSIS — F331 Major depressive disorder, recurrent, moderate: Secondary | ICD-10-CM

## 2018-11-07 NOTE — Telephone Encounter (Signed)
Yes, that is fine. We can reschedule out a couple months.  Thanks!

## 2018-11-07 NOTE — Telephone Encounter (Signed)
Patient called with an urgency stating that she wanted to cancel her CT Bone Marrow Biopsy on 11/11/2018 due to the fact that her husband is currently in the hospital and has a lot of things going on. Patient also stated that she would want this to be rescheduled in a month because of COVID-19. She wants to make sure that things have calmed down with her personal life and COVID-19. I told her that I would let Dr. Grayland Ormond know before I could cancel. Patient insisted to cancel the appointment. I told her that we would call her if needed to. Patient agreed.

## 2018-11-10 ENCOUNTER — Other Ambulatory Visit: Payer: Self-pay | Admitting: Student

## 2018-11-10 NOTE — Telephone Encounter (Signed)
Called Pamala Hurry at [facility] and told her to please cancel patient's CT Bone Marrow Biopsy. I told her that I would be calling her to reschedule it in 2 months per Dr. Grayland Ormond.  I then called patient to let her know that it was cancelled and that we would contact her in 2 months once it was rescheduled. Patient agreed.

## 2018-11-11 ENCOUNTER — Ambulatory Visit: Admission: RE | Admit: 2018-11-11 | Payer: BLUE CROSS/BLUE SHIELD | Source: Ambulatory Visit

## 2018-11-18 ENCOUNTER — Ambulatory Visit: Payer: BLUE CROSS/BLUE SHIELD | Admitting: Family Medicine

## 2018-11-18 ENCOUNTER — Ambulatory Visit: Payer: BLUE CROSS/BLUE SHIELD | Admitting: Psychology

## 2018-11-18 ENCOUNTER — Encounter: Payer: Self-pay | Admitting: Family Medicine

## 2018-11-18 ENCOUNTER — Ambulatory Visit (INDEPENDENT_AMBULATORY_CARE_PROVIDER_SITE_OTHER): Payer: BLUE CROSS/BLUE SHIELD | Admitting: Family Medicine

## 2018-11-18 ENCOUNTER — Other Ambulatory Visit: Payer: Self-pay

## 2018-11-18 ENCOUNTER — Telehealth: Payer: Self-pay | Admitting: Family Medicine

## 2018-11-18 DIAGNOSIS — E611 Iron deficiency: Secondary | ICD-10-CM | POA: Diagnosis not present

## 2018-11-18 DIAGNOSIS — I10 Essential (primary) hypertension: Secondary | ICD-10-CM

## 2018-11-18 DIAGNOSIS — R7989 Other specified abnormal findings of blood chemistry: Secondary | ICD-10-CM | POA: Diagnosis not present

## 2018-11-18 DIAGNOSIS — F321 Major depressive disorder, single episode, moderate: Secondary | ICD-10-CM

## 2018-11-18 NOTE — Assessment & Plan Note (Signed)
Well-controlled.  Continue current regimen. 

## 2018-11-18 NOTE — Assessment & Plan Note (Signed)
Likely related to PCOS.  Spironolactone has not been terribly beneficial.  She will monitor.

## 2018-11-18 NOTE — Progress Notes (Signed)
Virtual Visit via phone Note  This visit type was conducted due to national recommendations for restrictions regarding the COVID-19 pandemic (e.g. social distancing).  This format is felt to be most appropriate for this patient at this time.  All issues noted in this document were discussed and addressed.  No physical exam was performed (except for noted visual exam findings with Video Visits).   I connected with  today at 11:30 AM EDT by telephone and verified that I am speaking with the correct person using two identifiers. Location patient: car, not driving Location provider: work Persons participating in the virtual visit: patient, provider  I discussed the limitations, risks, security and privacy concerns of performing an evaluation and management service by telephone and the availability of in person appointments. I also discussed with the patient that there may be a patient responsible charge related to this service. The patient expressed understanding and agreed to proceed.  Interactive audio and video telecommunications were attempted between this provider and patient, however failed, due to patient having technical difficulties OR patient did not have access to video capability.  We continued and completed visit with audio only.   Reason for visit: follow-up  HPI: Depression: Patient notes she feels like the Zoloft is not working as well.  She is seeing a therapist as well.  She notes she is feeling more depressed.  She has had some trouble concentrating.  She stays jittery.  No SI.  Iron deficiency: Her iron remained stable previously following endometrial ablation.  She is no longer cycles.  No blood in her stool.  She was having heavy menstrual cycles deviously.  Hypertension: Typically 120/80.  Occasionally will get up to 150/90.  She is taking amlodipine, carvedilol, and spironolactone.  No chest pain, shortness breath, or edema.  Elevated testosterone: Previously evaluated  by endocrinology.  Work-up was negative.  Likely has PCOS that is contributing to her hirsutism.   ROS: See pertinent positives and negatives per HPI.  Past Medical History:  Diagnosis Date  . Anxiety   . Chicken pox   . Depression   . Elevated testosterone level in female   . Emphysema of lung (HCC)    bronchitis  . Frequent headaches   . Hay fever   . Hypertension     Past Surgical History:  Procedure Laterality Date  . HYSTEROSCOPY WITH NOVASURE N/A 02/21/2018   Procedure: HYSTEROSCOPY WITH NOVASURE, POLYPECTOMY;  Surgeon: Benjaman Kindler, MD;  Location: [facility] ORS;  Service: Gynecology;  Laterality: N/A;  . TUBAL LIGATION  1997  . TUBAL LIGATION      Family History  Problem Relation Age of Onset  . Hyperlipidemia Mother   . Heart disease Mother   . Stroke Mother   . Depression Mother   . Mental illness Mother   . Heart attack Mother 82  . Hyperlipidemia Father   . Depression Father   . Mental illness Father   . Diabetes Paternal Grandfather   . Cancer Cousin     SOCIAL HX: Smoker.   Current Outpatient Medications:  .  albuterol (PROVENTIL) (2.5 MG/3ML) 0.083% nebulizer solution, Take 3 mLs (2.5 mg total) by nebulization every 6 (six) hours as needed for wheezing or shortness of breath., Disp: 150 mL, Rfl: 1 .  amLODipine (NORVASC) 10 MG tablet, Take 1 tablet (10 mg total) by mouth daily., Disp: 90 tablet, Rfl: 3 .  baclofen (LIORESAL) 10 MG tablet, Take 1 tablet (10 mg total) by mouth 3 (three) times daily.,  Disp: 30 each, Rfl: 0 .  budesonide-formoterol (SYMBICORT) 160-4.5 MCG/ACT inhaler, Inhale 2 puffs into the lungs 2 (two) times daily., Disp: 1 Inhaler, Rfl: 6 .  carvedilol (COREG) 25 MG tablet, Take 1 tablet (25 mg total) by mouth 2 (two) times daily with a meal., Disp: 60 tablet, Rfl: 3 .  ibuprofen (ADVIL,MOTRIN) 200 MG tablet, Take 400-600 mg by mouth every 6 (six) hours as needed for headache., Disp: , Rfl:  .  OVER THE COUNTER MEDICATION, Take 2 drops by  mouth at bedtime. CBD Oil - takes for sleep, Disp: , Rfl:  .  sertraline (ZOLOFT) 100 MG tablet, Take 1.5 tablets (150 mg total) by mouth daily., Disp: 135 tablet, Rfl: 3 .  spironolactone (ALDACTONE) 25 MG tablet, Take 1 tablet (25 mg total) by mouth daily., Disp: 90 tablet, Rfl: 1 .  vitamin B-12 (CYANOCOBALAMIN) 1000 MCG tablet, Take 1,000 mcg by mouth daily., Disp: , Rfl:   EXAM: This was a telehealth telephone visit and thus no physical exam was completed.  ASSESSMENT AND PLAN:  Discussed the following assessment and plan:  Elevated testosterone level in female  Depression, major, single episode, moderate (HCC)  Essential hypertension  Elevated testosterone level in female Likely related to PCOS.  Spironolactone has not been terribly beneficial.  She will monitor.  Depression, major, single episode, moderate (HCC) Worsened.  Would like to transition her from Zoloft to Lexapro.  I will send a message to our clinical pharmacist to make sure that I make this transition appropriately.  HTN (hypertension) Well-controlled.  Continue current regimen.    I discussed the assessment and treatment plan with the patient. The patient was provided an opportunity to ask questions and all were answered. The patient agreed with the plan and demonstrated an understanding of the instructions.   The patient was advised to call back or seek an in-person evaluation if the symptoms worsen or if the condition fails to improve as anticipated.  I provided 18 minutes of non-face-to-face time during this encounter.   [name], MD

## 2018-11-18 NOTE — Telephone Encounter (Signed)
Please get the patient set up for lab work in 1 month and follow-up in 2 months.  Okay for a 15-minute same-day appointment.

## 2018-11-18 NOTE — Assessment & Plan Note (Signed)
Plan to recheck in 1 month.  Suspect this is related to heavy menstrual cycles.  If iron levels have become low again we would need to consider a GI source.

## 2018-11-18 NOTE — Telephone Encounter (Signed)
Called and spoke with pt. Pt scheduled for lab appt and needs to be scheduled for SAME day appt July 13 @ 3:15 PM OK by PCP   Sent to [facility] to schedule same day appt    Pt is aware that she has been scheduled

## 2018-11-18 NOTE — Telephone Encounter (Signed)
Scheduled

## 2018-11-18 NOTE — Progress Notes (Signed)
Pt would like to change medication ZOLOFT not work any more per pt

## 2018-11-18 NOTE — Assessment & Plan Note (Signed)
Worsened.  Would like to transition her from Zoloft to Lexapro.  I will send a message to our clinical pharmacist to make sure that I make this transition appropriately.

## 2018-11-21 ENCOUNTER — Encounter: Payer: Self-pay | Admitting: Family Medicine

## 2018-11-21 NOTE — Telephone Encounter (Signed)
Sent to PCP to advise if taper is needed or not

## 2018-11-22 MED ORDER — ESCITALOPRAM OXALATE 10 MG PO TABS: 10.0000 mg | ORAL_TABLET | Freq: Every day | ORAL | 3 refills | Status: DC

## 2018-11-22 NOTE — Progress Notes (Signed)
Greater than 5 minutes, yet less than 10 minutes of time have been spent researching, coordinating, and implementing care for this patient today.  Thank you for the details you included in the comment boxes. Those details are very helpful in determining the best course of treatment for you and help us to provide the best care.  

## 2018-12-08 ENCOUNTER — Emergency Department
Admission: EM | Admit: 2018-12-08 | Discharge: 2018-12-08 | Disposition: A | Discharge status: Home / Self Care | Payer: BLUE CROSS/BLUE SHIELD | Attending: Emergency Medicine | Admitting: Emergency Medicine

## 2018-12-08 ENCOUNTER — Encounter: Payer: Self-pay | Admitting: Emergency Medicine

## 2018-12-08 DIAGNOSIS — J449 Chronic obstructive pulmonary disease, unspecified: Secondary | ICD-10-CM | POA: Insufficient documentation

## 2018-12-08 DIAGNOSIS — I10 Essential (primary) hypertension: Secondary | ICD-10-CM | POA: Diagnosis not present

## 2018-12-08 DIAGNOSIS — R112 Nausea with vomiting, unspecified: Secondary | ICD-10-CM | POA: Diagnosis not present

## 2018-12-08 DIAGNOSIS — Z79899 Other long term (current) drug therapy: Secondary | ICD-10-CM | POA: Diagnosis not present

## 2018-12-08 DIAGNOSIS — R197 Diarrhea, unspecified: Secondary | ICD-10-CM | POA: Insufficient documentation

## 2018-12-08 DIAGNOSIS — R109 Unspecified abdominal pain: Secondary | ICD-10-CM | POA: Diagnosis not present

## 2018-12-08 DIAGNOSIS — F17210 Nicotine dependence, cigarettes, uncomplicated: Secondary | ICD-10-CM | POA: Diagnosis not present

## 2018-12-08 LAB — COMPREHENSIVE METABOLIC PANEL
ALT: 38 U/L (ref 0–44)
AST: 74 U/L — ABNORMAL HIGH (ref 15–41)
Albumin: 4.3 g/dL (ref 3.5–5.0)
Alkaline Phosphatase: 97 U/L (ref 38–126)
Anion gap: 11 (ref 5–15)
BUN: <5 mg/dL — ABNORMAL LOW (ref 6–20)
CO2: 25 mmol/L (ref 22–32)
Calcium: 9.1 mg/dL (ref 8.9–10.3)
Chloride: 105 mmol/L (ref 98–111)
Creatinine, Ser: 0.58 mg/dL (ref 0.44–1.00)
GFR calc Af Amer: >60 mL/min (ref >60)
GFR calc non Af Amer: >60 mL/min (ref >60)
Glucose, Bld: 131 mg/dL — ABNORMAL HIGH (ref 70–99)
Potassium: 3.3 mmol/L — ABNORMAL LOW (ref 3.5–5.1)
Sodium: 141 mmol/L (ref 135–145)
Total Bilirubin: 0.5 mg/dL (ref 0.3–1.2)
Total Protein: 7.6 g/dL (ref 6.5–8.1)

## 2018-12-08 LAB — CBC
HCT: 51.8 % — ABNORMAL HIGH (ref 36.0–46.0)
Hemoglobin: 17.7 g/dL — ABNORMAL HIGH (ref 12.0–15.0)
MCH: 32.4 pg (ref 26.0–34.0)
MCHC: 34.2 g/dL (ref 30.0–36.0)
MCV: 94.7 fL (ref 80.0–100.0)
Platelets: 85 10*3/uL — ABNORMAL LOW (ref 150–400)
RBC: 5.47 MIL/uL — ABNORMAL HIGH (ref 3.87–5.11)
RDW: 12.8 % (ref 11.5–15.5)
WBC: 7.8 10*3/uL (ref 4.0–10.5)
nRBC: 0 % (ref 0.0–0.2)

## 2018-12-08 LAB — LIPASE, BLOOD: Lipase: 33 U/L (ref 11–51)

## 2018-12-08 MED ORDER — ONDANSETRON 4 MG PO TBDP
4.0000 mg | ORAL_TABLET | Freq: Once | ORAL | Status: AC
  Administered 2018-12-08: 23:00:00 4 mg via ORAL
  Filled 2018-12-08: qty 1

## 2018-12-08 MED ORDER — DICYCLOMINE HCL 20 MG PO TABS: 20.0000 mg | ORAL_TABLET | Freq: Three times a day (TID) | ORAL | 0 refills | Status: DC | PRN

## 2018-12-08 MED ORDER — ONDANSETRON HCL 4 MG PO TABS: 4.0000 mg | ORAL_TABLET | Freq: Three times a day (TID) | ORAL | 0 refills | Status: DC | PRN

## 2018-12-08 MED ORDER — DICYCLOMINE HCL 10 MG PO CAPS
20.0000 mg | ORAL_CAPSULE | Freq: Once | ORAL | Status: AC
  Administered 2018-12-08: 20 mg via ORAL
  Filled 2018-12-08: qty 2

## 2018-12-08 NOTE — ED Notes (Signed)
Pt attempted to provide urine sample but was unable to. States she hasn't drank anything lately.

## 2018-12-08 NOTE — ED Provider Notes (Signed)
[facility] Emergency Department Provider Note    ____________________________________________   I have reviewed the triage vital signs and the nursing notes.   HISTORY  Chief Complaint Abdominal Pain   History limited by: Not Limited   HPI Carly [name] is a 48 y.o. female who presents to the emergency department today because of concern for nausea, vomiting, diarrhea and abdominal pain. The patient states that the symptoms started this evening. Gradually got worse. Did not notice any blood in her vomit or diarrhea. Pain is located throughout the stomach but worse in the lower part. Feels like cramping. She has not had any fevers. Had similar symptoms once before and was told she had food poisoning. The patient denies any known sick contacts.    Records reviewed. Per medical record review patient has a history of ER visit 2 years ago for similar symptoms.   Past Medical History:  Diagnosis Date  . Anxiety   . Chicken pox   . Depression   . Elevated testosterone level in female   . Emphysema of lung (HCC)    bronchitis  . Frequent headaches   . Hay fever   . Hypertension     Patient Active Problem List   Diagnosis Date Noted  . Abnormal uterine bleeding (AUB) 08/26/2018  . Depression, major, single episode, moderate ([place]) 08/04/2018  . Iron deficiency 09/02/2017  . Menorrhagia with regular cycle 10/22/2016  . Insomnia 08/15/2016  . Nocturia 06/18/2016  . Thrombocytopenia ([place]) 02/12/2016  . COPD exacerbation ([place]) 01/31/2016  . Carpal tunnel syndrome 01/20/2016  . Lateral epicondylitis 01/20/2016  . Obesity 01/04/2016  . Palpitations 12/15/2015  . Myalgia 10/31/2015  . Other fatigue 10/31/2015  . Mixed incontinence 03/09/2015  . COPD (chronic obstructive pulmonary disease) ([place]) 01/13/2015  . Acute bronchitis 11/15/2014  . Female hirsutism 09/22/2014  . Elevated testosterone level in female 09/22/2014  . Generalized anxiety disorder  08/23/2014  . Tobacco use disorder 08/23/2014  . Obesity (BMI 30-39.9) 08/23/2014  . HTN (hypertension) 08/23/2014    Past Surgical History:  Procedure Laterality Date  . HYSTEROSCOPY WITH NOVASURE N/A 02/21/2018   Procedure: HYSTEROSCOPY WITH NOVASURE, POLYPECTOMY;  Surgeon: Benjaman Kindler, MD;  Location: [facility] ORS;  Service: Gynecology;  Laterality: N/A;  . TUBAL LIGATION  1997  . TUBAL LIGATION      Prior to Admission medications   Medication Sig Start Date End Date Taking? Authorizing Provider  albuterol (PROVENTIL) (2.5 MG/3ML) 0.083% nebulizer solution Take 3 mLs (2.5 mg total) by nebulization every 6 (six) hours as needed for wheezing or shortness of breath. 10/08/18   Leone Haven, MD  amLODipine (NORVASC) 10 MG tablet Take 1 tablet (10 mg total) by mouth daily. 02/17/18   Leone Haven, MD  baclofen (LIORESAL) 10 MG tablet Take 1 tablet (10 mg total) by mouth 3 (three) times daily. 10/29/18   Sharion Balloon, FNP  budesonide-formoterol (SYMBICORT) 160-4.5 MCG/ACT inhaler Inhale 2 puffs into the lungs 2 (two) times daily. 08/04/18   Leone Haven, MD  carvedilol (COREG) 25 MG tablet Take 1 tablet (25 mg total) by mouth 2 (two) times daily with a meal. 08/06/18   Leone Haven, MD  escitalopram (LEXAPRO) 10 MG tablet Take 1 tablet (10 mg total) by mouth daily. 11/22/18   Leone Haven, MD  ibuprofen (ADVIL,MOTRIN) 200 MG tablet Take 400-600 mg by mouth every 6 (six) hours as needed for headache.    [provider]  OVER THE  COUNTER MEDICATION Take 2 drops by mouth at bedtime. CBD Oil - takes for sleep    [provider]  spironolactone (ALDACTONE) 25 MG tablet Take 1 tablet (25 mg total) by mouth daily. 02/17/18   Leone Haven, MD  vitamin B-12 (CYANOCOBALAMIN) 1000 MCG tablet Take 1,000 mcg by mouth daily.    [provider]    Allergies Meloxicam  Family History  Problem Relation Age of Onset  . Hyperlipidemia Mother   .  Heart disease Mother   . Stroke Mother   . Depression Mother   . Mental illness Mother   . Heart attack Mother 84  . Hyperlipidemia Father   . Depression Father   . Mental illness Father   . Diabetes Paternal Grandfather   . Cancer Cousin     Social History Social History   Tobacco Use  . Smoking status: Current Every Day Smoker    Packs/day: 0.50    Years: 28.00    Pack years: 14.00    Types: Cigarettes  . Smokeless tobacco: Never Used  Substance Use Topics  . Alcohol use: Yes    Alcohol/week: 8.0 standard drinks    Types: 8 Standard drinks or equivalent per week    Comment: Beer (Can)   . Drug use: No    Comment: CBD oil    Review of Systems Constitutional: No fever/chills Eyes: No visual changes. ENT: No sore throat. Cardiovascular: Denies chest pain. Respiratory: Denies shortness of breath. Gastrointestinal: Positive for abdominal pain, nausea, vomiting and diarrhea.  Genitourinary: Negative for dysuria. Musculoskeletal: Negative for back pain. Skin: Negative for rash. Neurological: Negative for headaches, focal weakness or numbness.  ____________________________________________   PHYSICAL EXAM:  VITAL SIGNS: ED Triage Vitals [12/08/18 2052]  Enc Vitals Group     BP (!) 167/99     Pulse Rate (!) 122     Resp 20     Temp 98 F (36.7 C)     Temp Source Oral     SpO2 98 %     Weight 200 lb (90.7 kg)     Height 5\' 3"  (1.6 m)   Constitutional: Alert and oriented.  Eyes: Conjunctivae are normal.  ENT      Head: Normocephalic and atraumatic.      Nose: No congestion/rhinnorhea.      Mouth/Throat: Mucous membranes are moist.      Neck: No stridor. Hematological/Lymphatic/Immunilogical: No cervical lymphadenopathy. Cardiovascular: Normal rate, regular rhythm.  No murmurs, rubs, or gallops.  Respiratory: Normal respiratory effort without tachypnea nor retractions. Breath sounds are clear and equal bilaterally. No  wheezes/rales/rhonchi. Gastrointestinal: Soft and diffusely tender to palpation. No rebound. No guarding.  Genitourinary: Deferred Musculoskeletal: Normal range of motion in all extremities. No lower extremity edema. Neurologic:  Normal speech and language. No gross focal neurologic deficits are appreciated.  Skin:  Skin is warm, dry and intact. No rash noted. Psychiatric: Mood and affect are normal. Speech and behavior are normal. Patient exhibits appropriate insight and judgment.  ____________________________________________    LABS (pertinent positives/negatives)  CBC wbc 7.8, hgb 17.7, plt 85 CMP wnl except k 3.3, glu 131, bun <5, ast 74 Lipase 33 ____________________________________________   EKG  None  ____________________________________________    RADIOLOGY  None  ____________________________________________   PROCEDURES  Procedures  ____________________________________________   INITIAL IMPRESSION / ASSESSMENT AND PLAN / ED COURSE  Pertinent labs & imaging results that were available during my care of the patient were reviewed by me and considered in  my medical decision making (see chart for details).   Patient presented to the emergency department tonight because of concern for nausea, vomiting, abdominal pain and diarrhea. The patient blood work without any leukocytosis. No fevers. abd exam showed somewhat diffuse tenderness without any rebound or guarding. At this point doubt significant focal infection. Think likely gastroenteritis. Patient was given zofran and bentyl. Offered IVFs however patient did want to leave sooner because she needs to get her husband to the hospital early tomorrow for surgery. She did feel better after oral medications. Will give prescription for the same.   ____________________________________________   FINAL CLINICAL IMPRESSION(S) / ED DIAGNOSES  Final diagnoses:  Nausea vomiting and diarrhea  Abdominal pain, unspecified  abdominal location     Note: This dictation was prepared with Dragon dictation. Any transcriptional errors that result from this process are unintentional     Nance Pear, MD 12/08/18 2340

## 2018-12-08 NOTE — ED Triage Notes (Signed)
Pt reports sudden onset of nausea, diarrhea and lower abdominal cramping x3 hours ago. Pt denies emesis. Pt sts, "it feels like labor pains. Last time I had this, I had food poisoning." Pt had cereal prior to symptoms.

## 2018-12-08 NOTE — ED Notes (Addendum)
Pt states usually takes BP meds in the evening but did not take today d/t vomiting. Pt given warm blanket. Call bell within reach. Rail up. Bed locked in lowest position. Given tv remote.

## 2018-12-08 NOTE — ED Notes (Signed)
EDP Goodman at bedside.  

## 2018-12-08 NOTE — ED Notes (Signed)
Pt states will take her BP meds once home.

## 2018-12-08 NOTE — Discharge Instructions (Addendum)
Please seek medical attention for any high fevers, chest pain, shortness of breath, change in behavior, persistent vomiting, bloody stool or any other new or concerning symptoms.  

## 2018-12-09 ENCOUNTER — Ambulatory Visit: Payer: Self-pay | Admitting: Family Medicine

## 2018-12-10 ENCOUNTER — Telehealth: Payer: BLUE CROSS/BLUE SHIELD | Admitting: Family

## 2018-12-10 DIAGNOSIS — R109 Unspecified abdominal pain: Secondary | ICD-10-CM

## 2018-12-10 DIAGNOSIS — R197 Diarrhea, unspecified: Secondary | ICD-10-CM

## 2018-12-10 NOTE — Progress Notes (Signed)
Based on what you shared with me, I feel your condition warrants further evaluation and I recommend that you be seen for a face to face office visit.  Given your symptoms of abdominal pain and diarrhea you need to be seen face to face. I recommend following up with your PCP.    NOTE: If you entered your credit card information for this eVisit, you will not be charged. You may see a "hold" on your card for the $35 but that hold will drop off and you will not have a charge processed.  If you are having a true medical emergency please call 911.  If you need an urgent face to face visit, [facility] has four urgent care centers for your convenience.    PLEASE NOTE: THE [facility] LOCATIONS AND URGENT CARE CLINICS DO NOT HAVE THE TESTING FOR CORONAVIRUS COVID19 AVAILABLE.  IF YOU FEEL YOU NEED THIS TEST YOU MUST GO TO A TRIAGE LOCATION AT [place]   [email or web address] to reserve your spot online an avoid wait times  [facility] [address] [place], [place] [address] Modified hours of operation: Monday-Friday, 12 PM to 6 PM  Saturday & Sunday 10 AM to 4 PM *Across the street from [place] (New Address!) [address] [place], [place] [address] *Just off [address], across the road from [place] hours of operation: Monday-Friday, 12 PM to 6 PM  Closed Saturday & Sunday  [facility]'s modified hours of operation will be in effect from May 1 until May 31   The following sites will take your insurance:  . [facility] Urgent [place] a Provider at this Location  [address] [place], [place] [address] . 10 am to 8 pm Monday-Friday . 12 pm to 8 pm Saturday-Sunday   . [facility] [place] a Provider at this Location  [place], [place], [place] [address] . 8 am to 8 pm Monday-Friday . 9 am to 6 pm Saturday . 11 am to 6 pm Sunday   . [facility]gent Care at [place] Get Driving Directions  [address] [place], [place] [address] . 8 am to 8 pm Monday-Friday . 8 am to 4 pm Saturday-Sunday   Your e-visit answers were reviewed by a board certified advanced clinical practitioner to complete your personal care plan.  Thank you for using e-Visits.

## 2018-12-22 ENCOUNTER — Other Ambulatory Visit: Payer: Self-pay

## 2018-12-22 ENCOUNTER — Other Ambulatory Visit (INDEPENDENT_AMBULATORY_CARE_PROVIDER_SITE_OTHER): Payer: BLUE CROSS/BLUE SHIELD

## 2018-12-22 DIAGNOSIS — E611 Iron deficiency: Secondary | ICD-10-CM | POA: Diagnosis not present

## 2018-12-23 LAB — IBC + FERRITIN
Ferritin: 42 ng/mL (ref 10.0–291.0)
Iron: 181 ug/dL — ABNORMAL HIGH (ref 42–145)
Saturation Ratios: 43 % (ref 20.0–50.0)
Transferrin: 301 mg/dL (ref 212.0–360.0)

## 2018-12-27 DIAGNOSIS — M79672 Pain in left foot: Secondary | ICD-10-CM | POA: Diagnosis not present

## 2019-01-07 ENCOUNTER — Telehealth: Payer: Self-pay

## 2019-01-07 NOTE — Telephone Encounter (Signed)
Called patient and left her a detailed message to call me back so she could let me know when she would want to reschedule her CT Bone Marrow Biopsy. Awaiting on her response. 

## 2019-01-08 DIAGNOSIS — M21622 Bunionette of left foot: Secondary | ICD-10-CM | POA: Diagnosis not present

## 2019-01-08 DIAGNOSIS — M7752 Other enthesopathy of left foot: Secondary | ICD-10-CM | POA: Diagnosis not present

## 2019-01-12 ENCOUNTER — Other Ambulatory Visit: Payer: Self-pay

## 2019-01-12 DIAGNOSIS — D696 Thrombocytopenia, unspecified: Secondary | ICD-10-CM

## 2019-01-12 NOTE — Telephone Encounter (Signed)
Patient called me back and she stated that she was ready to proceed with the Bone Marrow Biopsy. I told her that I would have to call her back once I heard from the schedulers. Patient understood.

## 2019-01-15 NOTE — Telephone Encounter (Signed)
Called patient to let her know that her biopsy was scheduled. Patient stated that her father-in-law passed away and she needs another two weeks to have things all set up and be cleared to have done what she needs. I told her that I would cancel her biopsy and reschedule it for her. I will call patient back once I have another date to offer. Patient understood.

## 2019-01-20 ENCOUNTER — Ambulatory Visit: Admission: RE | Admit: 2019-01-20 | Payer: BC Managed Care – PPO | Source: Ambulatory Visit

## 2019-01-26 ENCOUNTER — Ambulatory Visit (INDEPENDENT_AMBULATORY_CARE_PROVIDER_SITE_OTHER): Payer: BC Managed Care – PPO | Admitting: Family Medicine

## 2019-01-26 ENCOUNTER — Telehealth: Payer: Self-pay | Admitting: Family Medicine

## 2019-01-26 ENCOUNTER — Other Ambulatory Visit: Payer: Self-pay

## 2019-01-26 DIAGNOSIS — G44209 Tension-type headache, unspecified, not intractable: Secondary | ICD-10-CM

## 2019-01-26 DIAGNOSIS — R945 Abnormal results of liver function studies: Secondary | ICD-10-CM

## 2019-01-26 DIAGNOSIS — F321 Major depressive disorder, single episode, moderate: Secondary | ICD-10-CM

## 2019-01-26 DIAGNOSIS — I10 Essential (primary) hypertension: Secondary | ICD-10-CM

## 2019-01-26 DIAGNOSIS — Z20822 Contact with and (suspected) exposure to covid-19: Secondary | ICD-10-CM

## 2019-01-26 DIAGNOSIS — R7989 Other specified abnormal findings of blood chemistry: Secondary | ICD-10-CM

## 2019-01-26 NOTE — Progress Notes (Signed)
Virtual Visit via video Note  This visit type was conducted due to national recommendations for restrictions regarding the COVID-19 pandemic (e.g. social distancing).  This format is felt to be most appropriate for this patient at this time.  All issues noted in this document were discussed and addressed.  No physical exam was performed (except for noted visual exam findings with Video Visits).   I connected with Carly [name] today at  3:15 PM EDT by a video enabled telemedicine application and verified that I am speaking with the correct person using two identifiers. Location patient: home Location provider: work  Persons participating in the virtual visit: patient, provider  I discussed the limitations, risks, security and privacy concerns of performing an evaluation and management service by telephone and the availability of in person appointments. I also discussed with the patient that there may be a patient responsible charge related to this service. The patient expressed understanding and agreed to proceed.   Reason for visit: follow-up  HPI: Headache/sinus congestion: Patient notes for the last couple of weeks has had a frontal headache with some sinus congestion though no significant sinus pressure.  Her nose is chronically congested.  She has been sneezing a lot recently.  She does have some postnasal drip.  She notes no COVID-19 exposure.  No fevers.  No cough.  No vision changes, numbness, or weakness.  She is using generic Nasacort.  She notes her work screens are each morning and takes her temperature.  She has been going to work.  Hypertension: Typically running 120s over 70s.  She is currently taking amlodipine, carvedilol, and spironolactone.  No chest pain, shortness of breath, or edema.  Depression: She denies depression.  She notes her anxiety has improved.  She denies SI.  She continues on Lexapro.   ROS: See pertinent positives and negatives per HPI.  Past Medical  History:  Diagnosis Date  . Anxiety   . Chicken pox   . Depression   . Elevated testosterone level in female   . Emphysema of lung (HCC)    bronchitis  . Frequent headaches   . Hay fever   . Hypertension     Past Surgical History:  Procedure Laterality Date  . HYSTEROSCOPY WITH NOVASURE N/A 02/21/2018   Procedure: HYSTEROSCOPY WITH NOVASURE, POLYPECTOMY;  Surgeon: Benjaman Kindler, MD;  Location: [facility] ORS;  Service: Gynecology;  Laterality: N/A;  . TUBAL LIGATION  1997  . TUBAL LIGATION      Family History  Problem Relation Age of Onset  . Hyperlipidemia Mother   . Heart disease Mother   . Stroke Mother   . Depression Mother   . Mental illness Mother   . Heart attack Mother 41  . Hyperlipidemia Father   . Depression Father   . Mental illness Father   . Diabetes Paternal Grandfather   . Cancer Cousin     SOCIAL HX: Smoker   Current Outpatient Medications:  .  albuterol (PROVENTIL) (2.5 MG/3ML) 0.083% nebulizer solution, Take 3 mLs (2.5 mg total) by nebulization every 6 (six) hours as needed for wheezing or shortness of breath., Disp: 150 mL, Rfl: 1 .  amLODipine (NORVASC) 10 MG tablet, Take 1 tablet (10 mg total) by mouth daily., Disp: 90 tablet, Rfl: 3 .  baclofen (LIORESAL) 10 MG tablet, Take 1 tablet (10 mg total) by mouth 3 (three) times daily., Disp: 30 each, Rfl: 0 .  budesonide-formoterol (SYMBICORT) 160-4.5 MCG/ACT inhaler, Inhale 2 puffs into the lungs 2 (two)  times daily., Disp: 1 Inhaler, Rfl: 6 .  carvedilol (COREG) 25 MG tablet, Take 1 tablet (25 mg total) by mouth 2 (two) times daily with a meal., Disp: 60 tablet, Rfl: 3 .  dicyclomine (BENTYL) 20 MG tablet, Take 1 tablet (20 mg total) by mouth 3 (three) times daily as needed., Disp: 30 tablet, Rfl: 0 .  escitalopram (LEXAPRO) 10 MG tablet, Take 1 tablet (10 mg total) by mouth daily., Disp: 30 tablet, Rfl: 3 .  ibuprofen (ADVIL,MOTRIN) 200 MG tablet, Take 400-600 mg by mouth every 6 (six) hours as needed for  headache., Disp: , Rfl:  .  ondansetron (ZOFRAN) 4 MG tablet, Take 1 tablet (4 mg total) by mouth every 8 (eight) hours as needed., Disp: 20 tablet, Rfl: 0 .  OVER THE COUNTER MEDICATION, Take 2 drops by mouth at bedtime. CBD Oil - takes for sleep, Disp: , Rfl:  .  spironolactone (ALDACTONE) 25 MG tablet, Take 1 tablet (25 mg total) by mouth daily., Disp: 90 tablet, Rfl: 1 .  vitamin B-12 (CYANOCOBALAMIN) 1000 MCG tablet, Take 1,000 mcg by mouth daily., Disp: , Rfl:  .  cetirizine (ZYRTEC) 10 MG tablet, Take 1 tablet (10 mg total) by mouth daily., Disp: 30 tablet, Rfl: 11  EXAM:  VITALS per patient if applicable: None.  GENERAL: alert, oriented, appears well and in no acute distress  HEENT: atraumatic, conjunttiva clear, no obvious abnormalities on inspection of external nose and ears  NECK: normal movements of the head and neck  LUNGS: on inspection no signs of respiratory distress, breathing rate appears normal, no obvious gross SOB, gasping or wheezing  CV: no obvious cyanosis  MS: moves all visible extremities without noticeable abnormality  PSYCH/NEURO: pleasant and cooperative, no obvious depression or anxiety, speech and thought processing grossly intact  ASSESSMENT AND PLAN:  Discussed the following assessment and plan:  Headache Patient with headache as well as sinus and allergy symptoms.  Discussed that the symptoms could be related to allergic rhinitis though could be consistent with COVID-19 as well.  Advised getting a COVID-19 test and having her quarantine at home until the test returns.  Discussed having anyone that lives with her try to quarantine as well and if they are not able to quarantine due to work restrictions that they would need to wear a mask and check their temperature prior to going to work.  If they develop symptoms they would need to be evaluated.  I will send a message to the community testing pool to get her scheduled for COVID testing.  She was given  reasons to seek medical attention.  We will treat with Zyrtec as well.  Her nonsymptomatic significant other will pick this up from the pharmacy to the [facility].  HTN (hypertension) Well-controlled.  Continue current regimen.  Depression, major, single episode, moderate ([place]) Well-controlled.  Continue Lexapro.  Elevated LFTs Elevated on most recent lab work.  Plan to recheck once she is cleared regarding possible COVID-19 infection.    I discussed the assessment and treatment plan with the patient. The patient was provided an opportunity to ask questions and all were answered. The patient agreed with the plan and demonstrated an understanding of the instructions.   The patient was advised to call back or seek an in-person evaluation if the symptoms worsen or if the condition fails to improve as anticipated.   [name], MD

## 2019-01-26 NOTE — Telephone Encounter (Signed)
Contacted pt and she has been scheduled for 01/27/19 at the [facility] testing site at 8:30. Pt is aware to remain in her car and to wear a mask. She understood and had no additional questions at this time. Nothing further is needed  Order was placed

## 2019-01-26 NOTE — Addendum Note (Signed)
Addended by: Benson Setting L on: 01/26/2019 04:11 PM   Modules accepted: Orders

## 2019-01-26 NOTE — Telephone Encounter (Signed)
Patient is having headaches, nasal and sinus congestion, sore throat for the past week. I would like for her to be tested for COVID-19. Can you contact her to be scheduled for COVID19 testing? Thanks.

## 2019-01-27 ENCOUNTER — Other Ambulatory Visit: Payer: Self-pay

## 2019-01-27 DIAGNOSIS — Z20822 Contact with and (suspected) exposure to covid-19: Secondary | ICD-10-CM

## 2019-01-27 DIAGNOSIS — R6889 Other general symptoms and signs: Secondary | ICD-10-CM | POA: Diagnosis not present

## 2019-01-28 ENCOUNTER — Telehealth: Payer: BC Managed Care – PPO | Admitting: Family

## 2019-01-28 DIAGNOSIS — R51 Headache: Secondary | ICD-10-CM | POA: Diagnosis not present

## 2019-01-28 DIAGNOSIS — R0981 Nasal congestion: Secondary | ICD-10-CM

## 2019-01-28 DIAGNOSIS — R519 Headache, unspecified: Secondary | ICD-10-CM

## 2019-01-28 NOTE — Progress Notes (Signed)
E-Visit for Corona Virus Screening   Your current symptoms could be consistent with the coronavirus.  I have sent your note to our Community Testing site. They should reach out to you in the next 24 hours to set up a time for testing if you qualify.   Approximately 5 minutes was spent documenting and reviewing patient's chart.    COVID-19 is a respiratory illness with symptoms that are similar to the flu. Symptoms are typically mild to moderate, but there have been cases of severe illness and death due to the virus. The following symptoms may appear 2-14 days after exposure: . Fever . Cough . Shortness of breath or difficulty breathing . Chills . Repeated shaking with chills . Muscle pain . Headache . Sore throat . New loss of taste or smell . Fatigue . Congestion or runny nose . Nausea or vomiting . Diarrhea  It is vitally important that if you feel that you have an infection such as this virus or any other virus that you stay home and away from places where you may spread it to others.  You should self-quarantine for 14 days if you have symptoms that could potentially be coronavirus or have been in close contact a with a person diagnosed with COVID-19 within the last 2 weeks. You should avoid contact with people age 65 and older.   You should wear a mask or cloth face covering over your nose and mouth if you must be around other people or animals, including pets (even at home). Try to stay at least 6 feet away from other people. This will protect the people around you.   You may also take acetaminophen (Tylenol) as needed for fever.   Reduce your risk of any infection by using the same precautions used for avoiding the common cold or flu:  . Wash your hands often with soap and warm water for at least 20 seconds.  If soap and water are not readily available, use an alcohol-based hand sanitizer with at least 60% alcohol.  . If coughing or sneezing, cover your mouth and nose by  coughing or sneezing into the elbow areas of your shirt or coat, into a tissue or into your sleeve (not your hands). . Avoid shaking hands with others and consider head nods or verbal greetings only. . Avoid touching your eyes, nose, or mouth with unwashed hands.  . Avoid close contact with people who are sick. . Avoid places or events with large numbers of people in one location, like concerts or sporting events. . Carefully consider travel plans you have or are making. . If you are planning any travel outside or inside the [place], visit the CDC's Travelers' Health webpage for the latest health notices. . If you have some symptoms but not all symptoms, continue to monitor at home and seek medical attention if your symptoms worsen. . If you are having a medical emergency, call 911.  HOME CARE . Only take medications as instructed by your medical team. . Drink plenty of fluids and get plenty of rest. . A steam or ultrasonic humidifier can help if you have congestion.   GET HELP RIGHT AWAY IF YOU HAVE EMERGENCY WARNING SIGNS** FOR COVID-19. If you or someone is showing any of these signs seek emergency medical care immediately. Call 911 or proceed to your closest emergency facility if: . You develop worsening high fever. . Trouble breathing . Bluish lips or face . Persistent pain or pressure in the chest . New   confusion . Inability to wake or stay awake . You cough up blood. . Your symptoms become more severe  **This list is not all possible symptoms. Contact your medical provider for any symptoms that are sever or concerning to you.   MAKE SURE YOU   Understand these instructions.  Will watch your condition.  Will get help right away if you are not doing well or get worse.  Your e-visit answers were reviewed by a board certified advanced clinical practitioner to complete your personal care plan.  Depending on the condition, your plan could have included both over the counter or  prescription medications.  If there is a problem please reply once you have received a response from your provider.  Your safety is important to us.  If you have drug allergies check your prescription carefully.    You can use MyChart to ask questions about today's visit, request a non-urgent call back, or ask for a work or school excuse for 24 hours related to this e-Visit. If it has been greater than 24 hours you will need to follow up with your provider, or enter a new e-Visit to address those concerns. You will get an e-mail in the next two days asking about your experience.  I hope that your e-visit has been valuable and will speed your recovery. Thank you for using e-visits.    

## 2019-01-30 ENCOUNTER — Encounter: Payer: Self-pay | Admitting: Family Medicine

## 2019-01-30 DIAGNOSIS — R7989 Other specified abnormal findings of blood chemistry: Secondary | ICD-10-CM | POA: Insufficient documentation

## 2019-01-30 DIAGNOSIS — R519 Headache, unspecified: Secondary | ICD-10-CM | POA: Insufficient documentation

## 2019-01-30 MED ORDER — CETIRIZINE HCL 10 MG PO TABS: 10.0000 mg | ORAL_TABLET | Freq: Every day | ORAL | 11 refills | Status: DC

## 2019-01-30 NOTE — Assessment & Plan Note (Signed)
Patient with headache as well as sinus and allergy symptoms.  Discussed that the symptoms could be related to allergic rhinitis though could be consistent with COVID-19 as well.  Advised getting a COVID-19 test and having her quarantine at home until the test returns.  Discussed having anyone that lives with her try to quarantine as well and if they are not able to quarantine due to work restrictions that they would need to wear a mask and check their temperature prior to going to work.  If they develop symptoms they would need to be evaluated.  I will send a message to the community testing pool to get her scheduled for COVID testing.  She was given reasons to seek medical attention.  We will treat with Zyrtec as well.  Her nonsymptomatic significant other will pick this up from the pharmacy to the [facility].

## 2019-01-30 NOTE — Assessment & Plan Note (Signed)
- 

## 2019-01-30 NOTE — Assessment & Plan Note (Signed)
Well-controlled.  Continue current regimen. 

## 2019-01-30 NOTE — Assessment & Plan Note (Signed)
Elevated on most recent lab work.  Plan to recheck once she is cleared regarding possible COVID-19 infection.

## 2019-01-31 LAB — NOVEL CORONAVIRUS, NAA: SARS-CoV-2, NAA: NOT DETECTED

## 2019-02-04 NOTE — Telephone Encounter (Signed)
Called patient and had to leave her a detailed voicemail. Patient was also told where to go for her COVID-19 test. I let patient know to call me if she had any questions.

## 2019-02-09 ENCOUNTER — Telehealth: Payer: Self-pay

## 2019-02-09 NOTE — Telephone Encounter (Signed)
Called patient three times and I always had to leave her a voicemail in reference to her bone marrow biopsy appointment. I left her a detailed message of when it was scheduled, time and location. I also left her the number to call [facility] scheduling in case she wanted to cancel or reschedule.

## 2019-02-10 ENCOUNTER — Other Ambulatory Visit: Payer: Self-pay | Admitting: Radiology

## 2019-02-11 ENCOUNTER — Other Ambulatory Visit (HOSPITAL_COMMUNITY)
Admission: RE | Admit: 2019-02-11 | Discharge: 2019-02-11 | Disposition: A | Discharge status: Home / Self Care | Payer: BC Managed Care – PPO | Source: Ambulatory Visit | Attending: Oncology | Admitting: Oncology

## 2019-02-11 ENCOUNTER — Ambulatory Visit: Admission: RE | Admit: 2019-02-11 | Payer: BC Managed Care – PPO | Source: Ambulatory Visit

## 2019-02-11 NOTE — Telephone Encounter (Signed)
Dr. Grayland Ormond, patient did not show up to her bone marrow biopsy again. This was her 3rd time rescheduling it. What would you want me to do? Please advise.

## 2019-02-11 NOTE — Telephone Encounter (Signed)
Do not reschedule. I guess send a letter to have patient call for a f/u appt here.

## 2019-02-12 NOTE — Telephone Encounter (Signed)
I will send a letter to the patient so she could get in contact with [place] since we have schedule her bone marrow biopsy and she does not show up to the appointment.

## 2019-04-02 ENCOUNTER — Telehealth: Payer: BC Managed Care – PPO | Admitting: Physician Assistant

## 2019-04-02 DIAGNOSIS — J019 Acute sinusitis, unspecified: Secondary | ICD-10-CM | POA: Diagnosis not present

## 2019-04-02 MED ORDER — FLUTICASONE PROPIONATE 50 MCG/ACT NA SUSP: 2.0000 | Freq: Every day | NASAL | 6 refills | Status: DC

## 2019-04-02 NOTE — Progress Notes (Signed)

## 2019-04-23 ENCOUNTER — Telehealth: Payer: BC Managed Care – PPO | Admitting: Physician Assistant

## 2019-04-23 DIAGNOSIS — L299 Pruritus, unspecified: Secondary | ICD-10-CM

## 2019-04-23 NOTE — Progress Notes (Signed)
Hi Carly [name],  I am sorry you aren't feeling well.  There are several things that can cause an itchy head. Unfortunately, this cannot be taken care of via e-visit because a provider needs to look at your scalp to better assess what is going on.    Based on what you shared with me, I feel your condition warrants further evaluation and I recommend that you be seen for a face to face office visit.  You can make an appointment with your PCP, or see below for urgent care locations.   NOTE: If you entered your credit card information for this eVisit, you will not be charged. You may see a "hold" on your card for the $35 but that hold will drop off and you will not have a charge processed.  If you are having a true medical emergency please call 911.     For an urgent face to face visit, [facility] has four urgent care centers for your convenience:   . [facility]    [phone]                  Get Driving Directions  [id number] [place], [place] [address] . 10 am to 8 pm Monday-Friday . 12 pm to 8 pm Saturday-Sunday   . [facility] at [place]                  Get Driving Directions  [id number] [place], [place], [place] [address] . 8 am to 8 pm Monday-Friday . 9 am to 6 pm Saturday . 11 am to 6 pm Sunday   . [facility] at [place]                  Get Driving Directions   [address] [place], [place] [address] . 8 am to 8 pm Monday-Friday . 8 am to 4 pm Saturday-Sunday    . [facility] at [place]                    Get Driving Directions  [id number]  [address] [place], [place] [address]  . Monday-Friday, 12 PM to 6 PM    Your e-visit answers were reviewed by a board certified advanced clinical practitioner to complete your personal care plan.  Thank you for using e-Visits.

## 2019-04-29 ENCOUNTER — Telehealth: Payer: BC Managed Care – PPO | Admitting: Family

## 2019-04-29 DIAGNOSIS — R399 Unspecified symptoms and signs involving the genitourinary system: Secondary | ICD-10-CM | POA: Diagnosis not present

## 2019-04-29 MED ORDER — CEPHALEXIN 500 MG PO CAPS: 500.0000 mg | ORAL_CAPSULE | Freq: Two times a day (BID) | ORAL | 0 refills | Status: DC

## 2019-04-29 NOTE — Progress Notes (Signed)

## 2019-05-08 ENCOUNTER — Ambulatory Visit: Payer: BC Managed Care – PPO | Admitting: Family Medicine

## 2019-05-08 ENCOUNTER — Encounter: Payer: Self-pay | Admitting: Family Medicine

## 2019-05-08 ENCOUNTER — Other Ambulatory Visit: Payer: Self-pay

## 2019-05-08 ENCOUNTER — Telehealth: Payer: Self-pay | Admitting: *Deleted

## 2019-05-08 VITALS — BP 122/72 | HR 95 | Temp 97.5°F | Ht 64.0 in | Wt 191.6 lb

## 2019-05-08 DIAGNOSIS — R300 Dysuria: Secondary | ICD-10-CM | POA: Diagnosis not present

## 2019-05-08 LAB — POCT URINALYSIS DIPSTICK
Bilirubin, UA: NEGATIVE
Blood, UA: NEGATIVE
Glucose, UA: NEGATIVE
Ketones, UA: NEGATIVE
Leukocytes, UA: NEGATIVE
Nitrite, UA: NEGATIVE
Protein, UA: NEGATIVE
Spec Grav, UA: 1.01 (ref 1.010–1.025)
Urobilinogen, UA: 0.2 E.U./dL
pH, UA: 6 (ref 5.0–8.0)

## 2019-05-08 MED ORDER — PHENAZOPYRIDINE HCL 100 MG PO TABS: 100.0000 mg | ORAL_TABLET | Freq: Three times a day (TID) | ORAL | 0 refills | Status: DC | PRN

## 2019-05-08 NOTE — Telephone Encounter (Signed)
Ok please schedule

## 2019-05-08 NOTE — Telephone Encounter (Signed)
This is fine with me   

## 2019-05-08 NOTE — Telephone Encounter (Signed)
Patient called to switch PCP because she would prefer a female doctor.  Patient would like to see Dr. Olivia Mackie.  Please advise.  Nina,cma

## 2019-05-08 NOTE — Patient Instructions (Addendum)
Use pyridium to help feeling of pressure (this med can turn urine orange color, this is OK if it happens). We will send urine out for culture.  Your UA dip is clear at this time.  Increase water, avoid sugar and caffeine in bevarages   Dysuria Dysuria is pain or discomfort while urinating. The pain or discomfort may be felt in the part of your body that drains urine from the bladder (urethra) or in the surrounding tissue of the genitals. The pain may also be felt in the groin area, lower abdomen, or lower back. You may have to urinate frequently or have the sudden feeling that you have to urinate (urgency). Dysuria can affect both men and women, but it is more common in women. Dysuria can be caused by many different things, including:  Urinary tract infection.  Kidney stones or bladder stones.  Certain sexually transmitted infections (STIs), such as chlamydia.  Dehydration.  Inflammation of the tissues of the vagina.  Use of certain medicines.  Use of certain soaps or scented products that cause irritation. Follow these instructions at home: General instructions  Watch your condition for any changes.  Urinate often. Avoid holding urine for long periods of time.  After a bowel movement or urination, women should cleanse from front to back, using each tissue only once.  Urinate after sexual intercourse.  Keep all follow-up visits as told by your health care provider. This is important.  If you had any tests done to find the cause of dysuria, it is up to you to get your test results. Ask your health care provider, or the department that is doing the test, when your results will be ready. Eating and drinking   Drink enough fluid to keep your urine pale yellow.  Avoid caffeine, tea, and alcohol. They can irritate the bladder and make dysuria worse. In men, alcohol may irritate the prostate. Medicines  Take over-the-counter and prescription medicines only as told by your  health care provider.  If you were prescribed an antibiotic medicine, take it as told by your health care provider. Do not stop taking the antibiotic even if you start to feel better. Contact a health care provider if:  You have a fever.  You develop pain in your back or sides.  You have nausea or vomiting.  You have blood in your urine.  You are not urinating as often as you usually do. Get help right away if:  Your pain is severe and not relieved with medicines.  You cannot eat or drink without vomiting.  You are confused.  You have a rapid heartbeat while at rest.  You have shaking or chills.  You feel extremely weak. Summary  Dysuria is pain or discomfort while urinating. Many different conditions can lead to dysuria.  If you have dysuria, you may have to urinate frequently or have the sudden feeling that you have to urinate (urgency).  Watch your condition for any changes. Keep all follow-up visits as told by your health care provider.  Make sure that you urinate often and drink enough fluid to keep your urine pale yellow. This information is not intended to replace advice given to you by your health care provider. Make sure you discuss any questions you have with your health care provider. Document Released: 03/30/2004 Document Revised: 06/14/2017 Document Reviewed: 04/18/2017 Elsevier Patient Education  2020 [employer].

## 2019-05-08 NOTE — Telephone Encounter (Signed)
Copied from [place] [id number]. Topic: General - Other >> May 08, 2019  8:05 AM Keene Breath wrote: Reason for CRM: Patient called to switch PCP because she would prefer a female doctor.  Patient would like to see Dr. Olivia Mackie.  Please advise and call patient once it is approved.  CB# [phone]

## 2019-05-08 NOTE — Progress Notes (Signed)
Subjective:    Patient ID: Carly [name], female    DOB: May 31, 1971, 48 y.o.   MRN: [id number]  HPI  Patient presents to clinic due to a possible UTI.  Patient was recently treated for UTI over a telehealth visit with course of Keflex.  Finished last dose of Keflex yesterday.  Still having pressure in lower abdomen with urination and some urinary frequency.  Denies any visible blood in urine, visible discharge in urine, foul smell to urine.  No fever or chills.  No severe abdominal pain.  No vomiting or diarrhea.   Patient Active Problem List   Diagnosis Date Noted  . Headache 01/30/2019  . Elevated LFTs 01/30/2019  . Abnormal uterine bleeding (AUB) 08/26/2018  . Depression, major, single episode, moderate ([place]) 08/04/2018  . Iron deficiency 09/02/2017  . Menorrhagia with regular cycle 10/22/2016  . Insomnia 08/15/2016  . Nocturia 06/18/2016  . Thrombocytopenia ([place]) 02/12/2016  . COPD exacerbation ([place]) 01/31/2016  . Carpal tunnel syndrome 01/20/2016  . Lateral epicondylitis 01/20/2016  . Obesity 01/04/2016  . Palpitations 12/15/2015  . Myalgia 10/31/2015  . Other fatigue 10/31/2015  . Mixed incontinence 03/09/2015  . COPD (chronic obstructive pulmonary disease) ([place]) 01/13/2015  . Acute bronchitis 11/15/2014  . Female hirsutism 09/22/2014  . Elevated testosterone level in female 09/22/2014  . Generalized anxiety disorder 08/23/2014  . Tobacco use disorder 08/23/2014  . Obesity (BMI 30-39.9) 08/23/2014  . HTN (hypertension) 08/23/2014   Social History   Tobacco Use  . Smoking status: Current Every Day Smoker    Packs/day: 0.50    Years: 28.00    Pack years: 14.00    Types: Cigarettes  . Smokeless tobacco: Never Used  Substance Use Topics  . Alcohol use: Yes    Alcohol/week: 8.0 standard drinks    Types: 8 Standard drinks or equivalent per week    Comment: Beer (Can)    Review of Systems  Constitutional: Negative for chills, fatigue and fever.  HENT: Negative  for congestion, ear pain, sinus pain and sore throat.   Eyes: Negative.   Respiratory: Negative for cough, shortness of breath and wheezing.   Cardiovascular: Negative for chest pain, palpitations and leg swelling.  Gastrointestinal: Negative for abdominal pain, diarrhea, nausea and vomiting.  Genitourinary: +dysuria, frequency and urgency.  Musculoskeletal: Negative for arthralgias and myalgias.  Skin: Negative for color change, pallor and rash.  Neurological: Negative for syncope, light-headedness and headaches.  Psychiatric/Behavioral: The patient is not nervous/anxious.       Objective:   Physical Exam Vitals signs and nursing note reviewed.  Constitutional:      General: She is not in acute distress.    Appearance: She is well-developed. She is not toxic-appearing.  HENT:     Head: Normocephalic and atraumatic.  Eyes:     General: No scleral icterus.    Extraocular Movements: Extraocular movements intact.     Conjunctiva/sclera: Conjunctivae normal.  Neck:     Musculoskeletal: Neck supple.     Trachea: No tracheal deviation.  Cardiovascular:     Rate and Rhythm: Normal rate and regular rhythm.     Heart sounds: Normal heart sounds.  Pulmonary:     Effort: Pulmonary effort is normal. No respiratory distress.     Breath sounds: Normal breath sounds.  Abdominal:     General: Bowel sounds are normal. There is no distension.     Palpations: Abdomen is soft. There is no mass.     Tenderness: There is abdominal  tenderness (mild suprapubic with palpation). There is no right CVA tenderness, left CVA tenderness, guarding or rebound.  Skin:    General: Skin is warm and dry.     Coloration: Skin is not jaundiced or pale.  Neurological:     Mental Status: She is alert and oriented to person, place, and time.     Gait: Gait normal.  Psychiatric:        Mood and Affect: Mood normal.        Behavior: Behavior normal.    Today's Vitals   05/08/19 0926  BP: 122/72  Pulse: 95   Temp: (!) 97.5 F (36.4 C)  SpO2: 97%  Weight: 191 lb 9.6 oz (86.9 kg)  Height: 5\' 4"  (1.626 m)   Body mass index is 32.89 kg/m.    Assessment & Plan:   Dysuria - patient's urinalysis dipstick in clinic is completely clear today.  We will send another urine culture to lab.  Patient advised that sometimes body needs a little bit of time to bounce back and he will after having UTI and being treated.  She will avoid excess sugar and caffeinated beverages, increase water intake and be sure to always wipe front to back after using restroom.  She will use Pyridium as needed for bladder spasms.  Patient aware that if urine culture does show bacterial growth and requires antibiotic treatment then we will send that in for her.  Patient will otherwise keep all regular follow-up with PCP as planned and return to clinic if any issues arise.

## 2019-05-09 LAB — URINE CULTURE
MICRO NUMBER:: 1023655
Result:: NO GROWTH
SPECIMEN QUALITY:: ADEQUATE

## 2019-05-12 NOTE — Telephone Encounter (Signed)
Pt has been scheduled.  °

## 2019-05-22 ENCOUNTER — Encounter: Payer: Self-pay | Admitting: Internal Medicine

## 2019-05-22 ENCOUNTER — Other Ambulatory Visit: Payer: Self-pay

## 2019-05-22 ENCOUNTER — Ambulatory Visit (INDEPENDENT_AMBULATORY_CARE_PROVIDER_SITE_OTHER): Payer: BC Managed Care – PPO | Admitting: Internal Medicine

## 2019-05-22 VITALS — Ht 60.0 in | Wt 200.0 lb

## 2019-05-22 DIAGNOSIS — F419 Anxiety disorder, unspecified: Secondary | ICD-10-CM

## 2019-05-22 DIAGNOSIS — F329 Major depressive disorder, single episode, unspecified: Secondary | ICD-10-CM

## 2019-05-22 DIAGNOSIS — I10 Essential (primary) hypertension: Secondary | ICD-10-CM

## 2019-05-22 DIAGNOSIS — E538 Deficiency of other specified B group vitamins: Secondary | ICD-10-CM

## 2019-05-22 DIAGNOSIS — R739 Hyperglycemia, unspecified: Secondary | ICD-10-CM

## 2019-05-22 DIAGNOSIS — Z1329 Encounter for screening for other suspected endocrine disorder: Secondary | ICD-10-CM

## 2019-05-22 DIAGNOSIS — E559 Vitamin D deficiency, unspecified: Secondary | ICD-10-CM

## 2019-05-22 DIAGNOSIS — N3281 Overactive bladder: Secondary | ICD-10-CM | POA: Diagnosis not present

## 2019-05-22 DIAGNOSIS — Z124 Encounter for screening for malignant neoplasm of cervix: Secondary | ICD-10-CM

## 2019-05-22 DIAGNOSIS — F32A Depression, unspecified: Secondary | ICD-10-CM

## 2019-05-22 DIAGNOSIS — Z1231 Encounter for screening mammogram for malignant neoplasm of breast: Secondary | ICD-10-CM

## 2019-05-22 DIAGNOSIS — G4700 Insomnia, unspecified: Secondary | ICD-10-CM

## 2019-05-22 MED ORDER — ESCITALOPRAM OXALATE 20 MG PO TABS: 20.0000 mg | ORAL_TABLET | Freq: Every day | ORAL | 3 refills | Status: DC

## 2019-05-22 MED ORDER — TRAZODONE HCL 50 MG PO TABS: 25.0000 mg | ORAL_TABLET | Freq: Every evening | ORAL | 3 refills | Status: DC | PRN

## 2019-05-22 MED ORDER — MIRABEGRON ER 25 MG PO TB24: 25.0000 mg | ORAL_TABLET | Freq: Every day | ORAL | 3 refills | Status: DC

## 2019-05-22 NOTE — Patient Instructions (Signed)
L theanine 100 to 200 mg at night for sleep, stress, anxiety  Mirabegron extended-release tablets What is this medicine? MIRABEGRON (MIR a BEG ron) is used to treat overactive bladder. This medicine reduces the amount of bathroom visits. It may also help to control wetting accidents. It may be used alone, but sometimes may be given with other treatments. This medicine may be used for other purposes; ask your health care provider or pharmacist if you have questions. COMMON BRAND NAME(S): Myrbetriq What should I tell my health care provider before I take this medicine? They need to know if you have any of these conditions:  high blood pressure  kidney disease  liver disease  problems urinating  prostate disease  an unusual or allergic reaction to mirabegron, other medicines, foods, dyes, or preservatives  pregnant or trying to get pregnant  breast-feeding How should I use this medicine? Take this medicine by mouth with a glass of water. Follow the directions on the prescription label. Do not cut, crush or chew this medicine. You can take it with or without food. If it upsets your stomach, take it with food. Take your medicine at regular intervals. Do not take it more often than directed. Do not stop taking except on your doctor's advice. Talk to your pediatrician regarding the use of this medicine in children. Special care may be needed. Overdosage: If you think you have taken too much of this medicine contact a poison control center or emergency room at once. NOTE: This medicine is only for you. Do not share this medicine with others. What if I miss a dose? If you miss a dose, take it as soon as you can. If it is almost time for your next dose, take only that dose. Do not take double or extra doses. What may interact with this medicine?  codeine  desipramine  digoxin  flecainide  MAOIs like Carbex, Eldepryl, Marplan, Nardil, and Parnate  methadone  metoprolol  pimozide   propafenone  thioridazine  warfarin This list may not describe all possible interactions. Give your health care provider a list of all the medicines, herbs, non-prescription drugs, or dietary supplements you use. Also tell them if you smoke, drink alcohol, or use illegal drugs. Some items may interact with your medicine. What should I watch for while using this medicine? Visit your doctor or health care professional for regular checks on your progress. Check your blood pressure as directed. Ask your doctor or health care professional what your blood pressure should be and when you should contact him or her. You may need to limit your intake of tea, coffee, caffeinated sodas, or alcohol. These drinks may make your symptoms worse. What side effects may I notice from receiving this medicine? Side effects that you should report to your doctor or health care professional as soon as possible:  allergic reactions like skin rash, itching or hives, swelling of the face, lips, or tongue  high blood pressure  fast, irregular heartbeat  redness, blistering, peeling or loosening of the skin, including inside the mouth  signs of infection like fever or chills; pain or difficulty passing urine  trouble passing urine or change in the amount of urine Side effects that usually do not require medical attention (report to your doctor or health care professional if they continue or are bothersome):  constipation  dry mouth  headache  runny nose  stomach upset This list may not describe all possible side effects. Call your doctor for medical advice about  side effects. You may report side effects to FDA at 1-800-FDA-1088. Where should I keep my medicine? Keep out of the reach of children. Store at room temperature between 15 and 30 degrees C (59 and 86 degrees F). Throw away any unused medicine after the expiration date. NOTE: This sheet is a summary. It may not cover all possible information. If  you have questions about this medicine, talk to your doctor, pharmacist, or health care provider.  2020 Elsevier/Gold Standard (2016-11-22 11:33:21)  Overactive Bladder, Adult  Overactive bladder refers to a condition in which a person has a sudden need to pass urine. The person may leak urine if he or she cannot get to the bathroom fast enough (urinary incontinence). A person with this condition may also wake up several times in the night to go to the bathroom. Overactive bladder is associated with poor nerve signals between your bladder and your brain. Your bladder may get the signal to empty before it is full. You may also have very sensitive muscles that make your bladder squeeze too soon. These symptoms might interfere with daily work or social activities. What are the causes? This condition may be associated with or caused by:  Urinary tract infection.  Infection of nearby tissues, such as the prostate.  Prostate enlargement.  Surgery on the uterus or urethra.  Bladder stones, inflammation, or tumors.  Drinking too much caffeine or alcohol.  Certain medicines, especially medicines that get rid of extra fluid in the body (diuretics).  Muscle or nerve weakness, especially from: ? A spinal cord injury. ? Stroke. ? Multiple sclerosis. ? Parkinson's disease.  Diabetes.  Constipation. What increases the risk? You may be at greater risk for overactive bladder if you:  Are an older adult.  Smoke.  Are going through menopause.  Have prostate problems.  Have a neurological disease, such as stroke, dementia, Parkinson's disease, or multiple sclerosis (MS).  Eat or drink things that irritate the bladder. These include alcohol, spicy food, and caffeine.  Are overweight or obese. What are the signs or symptoms? Symptoms of this condition include:  Sudden, strong urge to urinate.  Leaking urine.  Urinating 8 or more times a day.  Waking up to urinate 2 or more times  a night. How is this diagnosed? Your health care provider may suspect overactive bladder based on your symptoms. He or she will diagnose this condition by:  A physical exam and medical history.  Blood or urine tests. You might need bladder or urine tests to help determine what is causing your overactive bladder. You might also need to see a health care provider who specializes in urinary tract problems (urologist). How is this treated? Treatment for overactive bladder depends on the cause of your condition and whether it is mild or severe. You can also make lifestyle changes at home. Options include:  Bladder training. This may include: ? Learning to control the urge to urinate by following a schedule that directs you to urinate at regular intervals (timed voiding). ? Doing Kegel exercises to strengthen your pelvic floor muscles, which support your bladder. Toning these muscles can help you control urination, even if your bladder muscles are overactive.  Special devices. This may include: ? Biofeedback, which uses sensors to help you become aware of your body's signals. ? Electrical stimulation, which uses electrodes placed inside the body (implanted) or outside the body. These electrodes send gentle pulses of electricity to strengthen the nerves or muscles that control the bladder. ? Women  may use a plastic device that fits into the vagina and supports the bladder (pessary).  Medicines. ? Antibiotics to treat bladder infection. ? Antispasmodics to stop the bladder from releasing urine at the wrong time. ? Tricyclic antidepressants to relax bladder muscles. ? Injections of botulinum toxin type A directly into the bladder tissue to relax bladder muscles.  Lifestyle changes. This may include: ? Weight loss. Talk to your health care provider about weight loss methods that would work best for you. ? Diet changes. This may include reducing how much alcohol and caffeine you consume, or drinking  fluids at different times of the day. ? Not smoking. Do not use any products that contain nicotine or tobacco, such as cigarettes and e-cigarettes. If you need help quitting, ask your health care provider.  Surgery. ? A device may be implanted to help manage the nerve signals that control urination. ? An electrode may be implanted to stimulate electrical signals in the bladder. ? A procedure may be done to change the shape of the bladder. This is done only in very severe cases. Follow these instructions at home: Lifestyle  Make any diet or lifestyle changes that are recommended by your health care provider. These may include: ? Drinking less fluid or drinking fluids at different times of the day. ? Cutting down on caffeine or alcohol. ? Doing Kegel exercises. ? Losing weight if needed. ? Eating a healthy and balanced diet to prevent constipation. This may include:  Eating foods that are high in fiber, such as fresh fruits and vegetables, whole grains, and beans.  Limiting foods that are high in fat and processed sugars, such as fried and sweet foods. General instructions  Take over-the-counter and prescription medicines only as told by your health care provider.  If you were prescribed an antibiotic medicine, take it as told by your health care provider. Do not stop taking the antibiotic even if you start to feel better.  Use any implants or pessary as told by your health care provider.  If needed, wear pads to absorb urine leakage.  Keep a journal or log to track how much and when you drink and when you feel the need to urinate. This will help your health care provider monitor your condition.  Keep all follow-up visits as told by your health care provider. This is important. Contact a health care provider if:  You have a fever.  Your symptoms do not get better with treatment.  Your pain and discomfort get worse.  You have more frequent urges to urinate. Get help right away  if:  You are not able to control your bladder. Summary  Overactive bladder refers to a condition in which a person has a sudden need to pass urine.  Several conditions may lead to an overactive bladder.  Treatment for overactive bladder depends on the cause and severity of your condition.  Follow your health care provider's instructions about lifestyle changes, doing Kegel exercises, keeping a journal, and taking medicines. This information is not intended to replace advice given to you by your health care provider. Make sure you discuss any questions you have with your health care provider. Document Released: 04/28/2009 Document Revised: 10/23/2018 Document Reviewed: 07/18/2017 Elsevier Patient Education  2020 [employer].

## 2019-05-22 NOTE — Progress Notes (Signed)
Virtual Visit via Video Note  I connected with Carly [name]   on 05/22/19 at  2:00 PM EST by a video enabled telemedicine application and verified that I am speaking with the correct person using two identifiers.  Location patient: home Location provider:work or home office Persons participating in the virtual visit: patient, provider  I discussed the limitations of evaluation and management by telemedicine and the availability of in person appointments. The patient expressed understanding and agreed to proceed.   HPI: 1. Increased anxiety due to family stressor and reduced sleep due to using bathroom 4-5 x at night due to overactive bladder on lexaprol 10 has therapist female at [facility] needs to make appt  She also c/o reduced appetite  2. Overactive bladder 4-5 x per not stopped spironolactone was on it due to excess hair growth stopped 2 months ago stops fluids at 6 pm and 1 20 oz mt dew diet per day  UTI weeks ago resolved per pt  3. HTN BP 120s-130s/80s on norvasc 10 mg qd   ROS: See pertinent positives and negatives per HPI.  Past Medical History:  Diagnosis Date  . Anxiety   . Chicken pox   . Depression   . Elevated testosterone level in female   . Emphysema of lung ([facility])    bronchitis  . Frequent headaches   . Hay fever   . Hypertension     Past Surgical History:  Procedure Laterality Date  . HYSTEROSCOPY WITH NOVASURE N/A 02/21/2018   Procedure: HYSTEROSCOPY WITH NOVASURE, POLYPECTOMY;  Surgeon: Carly Kindler, MD;  Location: [facility] ORS;  Service: Gynecology;  Laterality: N/A;  . TUBAL LIGATION  1997  . TUBAL LIGATION      Family History  Problem Relation Age of Onset  . Hyperlipidemia Mother   . Heart disease Mother   . Stroke Mother   . Depression Mother   . Mental illness Mother   . Heart attack Mother 60  . Hyperlipidemia Father   . Depression Father   . Mental illness Father   . Diabetes Paternal Grandfather   . Cancer Cousin     SOCIAL HX: married     Current Outpatient Medications:  .  albuterol (PROVENTIL) (2.5 MG/3ML) 0.083% nebulizer solution, Take 3 mLs (2.5 mg total) by nebulization every 6 (six) hours as needed for wheezing or shortness of breath., Disp: 150 mL, Rfl: 1 .  amLODipine (NORVASC) 10 MG tablet, Take 1 tablet (10 mg total) by mouth daily., Disp: 90 tablet, Rfl: 3 .  budesonide-formoterol (SYMBICORT) 160-4.5 MCG/ACT inhaler, Inhale 2 puffs into the lungs 2 (two) times daily., Disp: 1 Inhaler, Rfl: 6 .  carvedilol (COREG) 25 MG tablet, Take 1 tablet (25 mg total) by mouth 2 (two) times daily with a meal., Disp: 60 tablet, Rfl: 3 .  cetirizine (ZYRTEC) 10 MG tablet, Take 1 tablet (10 mg total) by mouth daily., Disp: 30 tablet, Rfl: 11 .  escitalopram (LEXAPRO) 20 MG tablet, Take 1 tablet (20 mg total) by mouth daily., Disp: 90 tablet, Rfl: 3 .  fluticasone (FLONASE) 50 MCG/ACT nasal spray, Place 2 sprays into both nostrils daily., Disp: 16 g, Rfl: 6 .  ibuprofen (ADVIL,MOTRIN) 200 MG tablet, Take 400-600 mg by mouth every 6 (six) hours as needed for headache., Disp: , Rfl:  .  OVER THE COUNTER MEDICATION, Take 2 drops by mouth at bedtime. CBD Oil - takes for sleep, Disp: , Rfl:  .  phenazopyridine (PYRIDIUM) 100 MG tablet, Take 1 tablet (100  mg total) by mouth 3 (three) times daily as needed for pain., Disp: 10 tablet, Rfl: 0 .  spironolactone (ALDACTONE) 25 MG tablet, Take 1 tablet (25 mg total) by mouth daily., Disp: 90 tablet, Rfl: 1 .  vitamin B-12 (CYANOCOBALAMIN) 1000 MCG tablet, Take 1,000 mcg by mouth daily., Disp: , Rfl:  .  mirabegron ER (MYRBETRIQ) 25 MG TB24 tablet, Take 1 tablet (25 mg total) by mouth daily., Disp: 90 tablet, Rfl: 3 .  traZODone (DESYREL) 50 MG tablet, Take 0.5-1 tablets (25-50 mg total) by mouth at bedtime as needed for sleep., Disp: 90 tablet, Rfl: 3  EXAM:  VITALS per patient if applicable:  GENERAL: alert, oriented, appears well and in no acute distress  HEENT: atraumatic, conjunttiva  clear, no obvious abnormalities on inspection of external nose and ears  NECK: normal movements of the head and neck  LUNGS: on inspection no signs of respiratory distress, breathing rate appears normal, no obvious gross SOB, gasping or wheezing  CV: no obvious cyanosis  MS: moves all visible extremities without noticeable abnormality  PSYCH/NEURO: pleasant and cooperative, no obvious depression or anxiety, speech and thought processing grossly intact  ASSESSMENT AND PLAN:  Discussed the following assessment and plan:  Essential hypertension - Plan: Lipid panel, Comprehensive metabolic panel, CBC with Differential/Platelet Cont meds   Overactive bladder - Plan: Urinalysis, Routine w reflex microscopic, Urine Culture, mirabegron ER (MYRBETRIQ) 25 MG TB24 tablet F/u ob/gyn to see if has bladder prolapse Carly [name]   B12 deficiency - Plan: B12  Vitamin D deficiency - Plan: Vitamin D (25 hydroxy)  Insomnia, unspecified type - Plan: traZODone (DESYREL) 50 MG tablet Anxiety and depression - Plan: escitalopram (LEXAPRO) 20 MG tablet increased from 10 mg qd  rec f/u with therapy   HM Flu shot disc at f/u  tdap utd due in 2026   Pap overdue sch Carly [name] s/p ablation and cervical polyps  mammo referred today Colonoscopy consider in future Carly [name] [facility] female  Skin assess at f/u   -we discussed possible serious and likely etiologies, options for evaluation and workup, limitations of telemedicine visit vs in person visit, treatment, treatment risks and precautions. Pt prefers to treat via telemedicine empirically rather then risking or undertaking an in person visit at this moment. Patient agrees to seek prompt in person care if worsening, new symptoms arise, or if is not improving with treatment.   I discussed the assessment and treatment plan with the patient. The patient was provided an opportunity to ask questions and all were answered. The patient agreed with the plan and  demonstrated an understanding of the instructions.   The patient was advised to call back or seek an in-person evaluation if the symptoms worsen or if the condition fails to improve as anticipated.  Time spent 25 minutes  Delorise Jackson, MD

## 2019-05-25 ENCOUNTER — Telehealth: Payer: Self-pay | Admitting: Internal Medicine

## 2019-05-25 NOTE — Telephone Encounter (Signed)
I called pt and left a vm to call ofc to schedule Return for fasting labs asap

## 2019-05-27 ENCOUNTER — Encounter: Payer: Self-pay | Admitting: Internal Medicine

## 2019-06-01 ENCOUNTER — Other Ambulatory Visit: Payer: BC Managed Care – PPO

## 2019-06-10 ENCOUNTER — Other Ambulatory Visit (INDEPENDENT_AMBULATORY_CARE_PROVIDER_SITE_OTHER): Payer: BC Managed Care – PPO

## 2019-06-10 ENCOUNTER — Other Ambulatory Visit: Payer: Self-pay

## 2019-06-10 ENCOUNTER — Telehealth: Payer: Self-pay | Admitting: Internal Medicine

## 2019-06-10 ENCOUNTER — Telehealth: Payer: Self-pay

## 2019-06-10 ENCOUNTER — Other Ambulatory Visit: Payer: Self-pay | Admitting: Internal Medicine

## 2019-06-10 DIAGNOSIS — I10 Essential (primary) hypertension: Secondary | ICD-10-CM | POA: Diagnosis not present

## 2019-06-10 DIAGNOSIS — R739 Hyperglycemia, unspecified: Secondary | ICD-10-CM | POA: Diagnosis not present

## 2019-06-10 DIAGNOSIS — E538 Deficiency of other specified B group vitamins: Secondary | ICD-10-CM | POA: Diagnosis not present

## 2019-06-10 DIAGNOSIS — E559 Vitamin D deficiency, unspecified: Secondary | ICD-10-CM | POA: Insufficient documentation

## 2019-06-10 DIAGNOSIS — Z1231 Encounter for screening mammogram for malignant neoplasm of breast: Secondary | ICD-10-CM

## 2019-06-10 DIAGNOSIS — N3281 Overactive bladder: Secondary | ICD-10-CM | POA: Diagnosis not present

## 2019-06-10 DIAGNOSIS — D751 Secondary polycythemia: Secondary | ICD-10-CM

## 2019-06-10 DIAGNOSIS — Z1329 Encounter for screening for other suspected endocrine disorder: Secondary | ICD-10-CM | POA: Diagnosis not present

## 2019-06-10 DIAGNOSIS — D696 Thrombocytopenia, unspecified: Secondary | ICD-10-CM

## 2019-06-10 LAB — COMPREHENSIVE METABOLIC PANEL
ALT: 21 U/L (ref 0–35)
AST: 34 U/L (ref 0–37)
Albumin: 3.8 g/dL (ref 3.5–5.2)
Alkaline Phosphatase: 73 U/L (ref 39–117)
BUN: 4 mg/dL — ABNORMAL LOW (ref 6–23)
CO2: 27 mEq/L (ref 19–32)
Calcium: 8.9 mg/dL (ref 8.4–10.5)
Chloride: 104 mEq/L (ref 96–112)
Creatinine, Ser: 0.6 mg/dL (ref 0.40–1.20)
GFR: 106.75 mL/min (ref >60.00)
Glucose, Bld: 95 mg/dL (ref 70–99)
Potassium: 3.6 mEq/L (ref 3.5–5.1)
Sodium: 141 mEq/L (ref 135–145)
Total Bilirubin: 0.7 mg/dL (ref 0.2–1.2)
Total Protein: 6.4 g/dL (ref 6.0–8.3)

## 2019-06-10 LAB — VITAMIN D 25 HYDROXY (VIT D DEFICIENCY, FRACTURES): VITD: 15.29 ng/mL — ABNORMAL LOW (ref 30.00–100.00)

## 2019-06-10 LAB — LIPID PANEL
Cholesterol: 236 mg/dL — ABNORMAL HIGH (ref 0–200)
HDL: 63.9 mg/dL (ref >39.00)
LDL Cholesterol: 140 mg/dL — ABNORMAL HIGH (ref 0–99)
NonHDL: 172.21
Total CHOL/HDL Ratio: 4
Triglycerides: 161 mg/dL — ABNORMAL HIGH (ref 0.0–149.0)
VLDL: 32.2 mg/dL (ref 0.0–40.0)

## 2019-06-10 LAB — CBC WITH DIFFERENTIAL/PLATELET
Basophils Absolute: 0.1 10*3/uL (ref 0.0–0.1)
Basophils Relative: 1 % (ref 0.0–3.0)
Eosinophils Absolute: 0.1 10*3/uL (ref 0.0–0.7)
Eosinophils Relative: 1.8 % (ref 0.0–5.0)
HCT: 48 % — ABNORMAL HIGH (ref 36.0–46.0)
Hemoglobin: 16.1 g/dL — ABNORMAL HIGH (ref 12.0–15.0)
Lymphocytes Relative: 22.5 % (ref 12.0–46.0)
Lymphs Abs: 1.5 10*3/uL (ref 0.7–4.0)
MCHC: 33.5 g/dL (ref 30.0–36.0)
MCV: 102.2 fl — ABNORMAL HIGH (ref 78.0–100.0)
Monocytes Absolute: 0.5 10*3/uL (ref 0.1–1.0)
Monocytes Relative: 7.9 % (ref 3.0–12.0)
Neutro Abs: 4.5 10*3/uL (ref 1.4–7.7)
Neutrophils Relative %: 66.8 % (ref 43.0–77.0)
Platelets: 30 10*3/uL — CL (ref 150.0–400.0)
RBC: 4.7 Mil/uL (ref 3.87–5.11)
RDW: 14.4 % (ref 11.5–15.5)
WBC: 6.7 10*3/uL (ref 4.0–10.5)

## 2019-06-10 LAB — VITAMIN B12: Vitamin B-12: 135 pg/mL — ABNORMAL LOW (ref 211–911)

## 2019-06-10 LAB — TSH: TSH: 1.89 u[IU]/mL (ref 0.35–4.50)

## 2019-06-10 LAB — HEMOGLOBIN A1C: Hgb A1c MFr Bld: 5.3 % (ref 4.6–6.5)

## 2019-06-10 MED ORDER — CHOLECALCIFEROL 1.25 MG (50000 UT) PO CAPS: 50000.0000 [IU] | ORAL_CAPSULE | ORAL | 1 refills | Status: DC

## 2019-06-10 NOTE — Telephone Encounter (Signed)
[place] called from [facility] lab with a  critically low platelet count of 30,000 for Carly [name] @15 :25. Read back for verification. Entered into critical value lab report. Provider notified by Elpidio Galea.

## 2019-06-10 NOTE — Telephone Encounter (Signed)
Called about critical lab platelets 30K  Will have pt f/u with h/o Dr. Grayland Ormond   Is he bleeding or headache?   C[place]

## 2019-06-10 NOTE — Telephone Encounter (Signed)
Pt was last seen in March I believe.  Her plt count was in the 50s then.  She has skipped several appts and BMBx since that time.

## 2019-06-10 NOTE — Telephone Encounter (Signed)
Patient stated she has always had low platelets. She is not bleeding and does not have a headache. She did however say she bruises easy.

## 2019-06-12 ENCOUNTER — Encounter: Payer: Self-pay | Admitting: Internal Medicine

## 2019-06-12 LAB — URINE CULTURE
MICRO NUMBER:: 1139382
SPECIMEN QUALITY:: ADEQUATE

## 2019-06-12 LAB — URINALYSIS, ROUTINE W REFLEX MICROSCOPIC
Bilirubin Urine: NEGATIVE
Glucose, UA: NEGATIVE
Hgb urine dipstick: NEGATIVE
Ketones, ur: NEGATIVE
Leukocytes,Ua: NEGATIVE
Nitrite: NEGATIVE
Protein, ur: NEGATIVE
Specific Gravity, Urine: 1.019 (ref 1.001–1.03)
pH: 6 (ref 5.0–8.0)

## 2019-06-15 ENCOUNTER — Other Ambulatory Visit: Payer: Self-pay | Admitting: Internal Medicine

## 2019-06-15 DIAGNOSIS — E538 Deficiency of other specified B group vitamins: Secondary | ICD-10-CM

## 2019-06-15 DIAGNOSIS — D696 Thrombocytopenia, unspecified: Secondary | ICD-10-CM

## 2019-06-15 DIAGNOSIS — Z13818 Encounter for screening for other digestive system disorders: Secondary | ICD-10-CM

## 2019-06-15 MED ORDER — CYANOCOBALAMIN 1000 MCG/ML IJ SOLN: INTRAMUSCULAR | 3 refills | Status: DC

## 2019-06-15 MED ORDER — "SYRINGE/NEEDLE (DISP) 25G X 1-1/2"" 1 ML MISC": 1.0000 | 11 refills | Status: DC

## 2019-06-25 ENCOUNTER — Encounter: Payer: Self-pay | Admitting: Internal Medicine

## 2019-07-14 ENCOUNTER — Encounter: Payer: Self-pay | Admitting: Internal Medicine

## 2019-07-14 ENCOUNTER — Ambulatory Visit (INDEPENDENT_AMBULATORY_CARE_PROVIDER_SITE_OTHER): Payer: BC Managed Care – PPO | Admitting: Internal Medicine

## 2019-07-14 ENCOUNTER — Other Ambulatory Visit: Payer: Self-pay

## 2019-07-14 ENCOUNTER — Telehealth: Payer: Self-pay | Admitting: Internal Medicine

## 2019-07-14 VITALS — BP 110/70 | Ht 60.0 in | Wt 200.0 lb

## 2019-07-14 DIAGNOSIS — I10 Essential (primary) hypertension: Secondary | ICD-10-CM

## 2019-07-14 DIAGNOSIS — N3281 Overactive bladder: Secondary | ICD-10-CM

## 2019-07-14 DIAGNOSIS — F17200 Nicotine dependence, unspecified, uncomplicated: Secondary | ICD-10-CM

## 2019-07-14 DIAGNOSIS — F411 Generalized anxiety disorder: Secondary | ICD-10-CM

## 2019-07-14 DIAGNOSIS — L989 Disorder of the skin and subcutaneous tissue, unspecified: Secondary | ICD-10-CM

## 2019-07-14 DIAGNOSIS — D696 Thrombocytopenia, unspecified: Secondary | ICD-10-CM | POA: Diagnosis not present

## 2019-07-14 DIAGNOSIS — Z720 Tobacco use: Secondary | ICD-10-CM | POA: Diagnosis not present

## 2019-07-14 DIAGNOSIS — E538 Deficiency of other specified B group vitamins: Secondary | ICD-10-CM

## 2019-07-14 DIAGNOSIS — G4700 Insomnia, unspecified: Secondary | ICD-10-CM

## 2019-07-14 MED ORDER — NICOTINE POLACRILEX 4 MG MT GUM: 4.0000 mg | CHEWING_GUM | OROMUCOSAL | 5 refills | Status: DC | PRN

## 2019-07-14 NOTE — Patient Instructions (Addendum)
Dr. Blima Rich Ward at [facility] ob[facility]gyn call to schedule pap and call to schedule mammogram at [facility]  Think about colonoscopy we have to get your platelets up 1st   Thrombocytopenia Thrombocytopenia is a condition in which you have a low number of platelets in your blood. Platelets are also called thrombocytes. Platelets are tiny cells in the blood. When you bleed, they clump together at the cut or injury to stop the bleeding. This is called blood clotting. Not having enough platelets can cause bleeding problems. Some cases of thrombocytopenia are mild while others are more severe. What are the causes? This condition may be caused by:  Decreased production of platelets. This may be caused by: ? Aplastic anemia. This is when your bone marrow stops making blood cells. ? Cancer in the bone marrow. ? Certain medicines, including chemotherapy. ? Infection in the bone marrow. ? Drinking a lot of alcohol.  Increased destruction of platelets. This may be caused by: ? Certain immune diseases. ? Certain medicines. ? Certain blood clotting disorders. ? Certain inherited disorders. ? Certain bleeding disorders. ? Pregnancy. ? Having an enlarged spleen (hypersplenism). In hypersplenism, the spleen gathers up platelets from circulation. This means that the platelets are not available to help with blood clotting. The spleen can be enlarged because of cirrhosis or other conditions. What are the signs or symptoms? Symptoms of this condition are the result of poor blood clotting. They will vary depending on how low the platelet counts are. Symptoms may include:  Abnormal bleeding.  Nosebleeds.  Heavy menstrual periods.  Blood in the urine or stool (feces).  A purplish discoloration in the skin (purpura).  Bruising.  A rash that looks like pinpoint, purplish-red spots (petechiae) on the skin and mucous membranes. How is this diagnosed?  This condition may be diagnosed with  blood tests and a physical exam. Sometimes, a sample of bone marrow may be removed to look for the original cells (megakaryocytes) that make platelets. Other tests may be needed depending on the cause. How is this treated? Treatment for this condition depends on the cause. Treatment options may include:  Treatment of another condition that is causing the low platelet count.  Medicines to help protect your platelets from being destroyed.  A replacement (transfusion) of platelets to stop or prevent bleeding.  Surgery to remove the spleen. Follow these instructions at home: Activity  Avoid activities that could cause injury or bruising, and follow instructions about how to prevent falls.  Take extra care not to cut yourself when you shave or when you use scissors, needles, knives, and other tools.  Take extra care to protect yourself from burns when ironing or cooking. General instructions   Check your skin and the inside of your mouth for bruising or bleeding as told by your health care provider.  Check your spit (sputum), urine, and stool for blood as told by your health care provider.  Do not drink alcohol.  Take over-the-counter and prescription medicines only as told by your health care provider.  Do not take any medicines that have aspirin or NSAIDs in them. These medicines can thin your blood and cause you to bleed more easily.  Tell all your health care providers, including dentists and eye doctors, about your condition. Contact a health care provider if you have:  Unexplained bruising. Get help right away if you have:  Active bleeding from anywhere on your body.  Blood in your sputum, urine, or stool. Summary  Thrombocytopenia is  a condition in which you have a low number of platelets in your blood.  Platelets are needed for blood clotting.  Symptoms of this condition are the result of poor blood clotting and may include abnormal bleeding, nosebleeds, and  bruising.  This condition may be diagnosed with blood tests and a physical exam.  Treatment for this condition depends on the cause. This information is not intended to replace advice given to you by your health care provider. Make sure you discuss any questions you have with your health care provider. Document Released: 07/02/2005 Document Revised: 04/03/2018 Document Reviewed: 04/03/2018 Elsevier Patient Education  [place].   Seborrheic Keratosis A seborrheic keratosis is a common, noncancerous (benign) skin growth. These growths are velvety, waxy, rough, tan, brown, or black spots that appear on the skin. These skin growths can be flat or raised, and scaly. What are the causes? The cause of this condition is not known. What increases the risk? You are more likely to develop this condition if you:  Have a family history of seborrheic keratosis.  Are 50 or older.  Are pregnant.  Have had estrogen replacement therapy. What are the signs or symptoms? Symptoms of this condition include growths on the face, chest, shoulders, back, or other areas. These growths:  Are usually painless, but may become irritated and itchy.  Can be yellow, brown, black, or other colors.  Are slightly raised or have a flat surface.  Are sometimes rough or wart-like in texture.  Are often velvety or waxy on the surface.  Are round or oval-shaped.  Often occur in groups, but may occur as a single growth. How is this diagnosed? This condition is diagnosed with a medical history and physical exam.  A sample of the growth may be tested (skin biopsy).  You may need to see a skin specialist (dermatologist). How is this treated? Treatment is not usually needed for this condition, unless the growths are irritated or bleed often.  You may also choose to have the growths removed if you do not like their appearance. ? Most commonly, these growths are treated with a procedure in which liquid  nitrogen is applied to "freeze" off the growth (cryosurgery). ? They may also be burned off with electricity (electrocautery) or removed by scraping (curettage). Follow these instructions at home:  Watch your growth for any changes.  Keep all follow-up visits as told by your health care provider. This is important.  Do not scratch or pick at the growth or growths. This can cause them to become irritated or infected. Contact a health care provider if:  You suddenly have many new growths.  Your growth bleeds, itches, or hurts.  Your growth suddenly becomes larger or changes color. Summary  A seborrheic keratosis is a common, noncancerous (benign) skin growth.  Treatment is not usually needed for this condition, unless the growths are irritated or bleed often.  Watch your growth for any changes.  Contact a health care provider if you suddenly have many new growths or your growth suddenly becomes larger or changes color.  Keep all follow-up visits as told by your health care provider. This is important. This information is not intended to replace advice given to you by your health care provider. Make sure you discuss any questions you have with your health care provider. Document Released: 08/04/2010 Document Revised: 11/14/2017 Document Reviewed: 11/14/2017 Elsevier Patient Education  [place].    Cholesterol Content in Foods Cholesterol is a waxy, fat-like substance  that helps to carry fat in the blood. The body needs cholesterol in small amounts, but too much cholesterol can cause damage to the arteries and heart. Most people should eat less than 200 milligrams (mg) of cholesterol a day. Foods with cholesterol  Cholesterol is found in animal-based foods, such as meat, seafood, and dairy. Generally, low-fat dairy and lean meats have less cholesterol than full-fat dairy and fatty meats. The milligrams of cholesterol per serving (mg per serving) of common  cholesterol-containing foods are listed below. Meat and other proteins  Egg -- one large whole egg has 186 mg.  Veal shank -- 4 oz has 141 mg.  Lean ground [place] (93% lean) -- 4 oz has 118 mg.  Fat-trimmed lamb loin -- 4 oz has 106 mg.  Lean ground beef (90% lean) -- 4 oz has 100 mg.  Lobster -- 3.5 oz has 90 mg.  Pork loin chops -- 4 oz has 86 mg.  Canned salmon -- 3.5 oz has 83 mg.  Fat-trimmed beef top loin -- 4 oz has 78 mg.  Frankfurter -- 1 frank (3.5 oz) has 77 mg.  Crab -- 3.5 oz has 71 mg.  Roasted chicken without skin, white meat -- 4 oz has 66 mg.  Light bologna -- 2 oz has 45 mg.  Deli-cut [place] -- 2 oz has 31 mg.  Canned tuna -- 3.5 oz has 31 mg.  Berniece Salines -- 1 oz has 29 mg.  Oysters and mussels (raw) -- 3.5 oz has 25 mg.  Mackerel -- 1 oz has 22 mg.  Trout -- 1 oz has 20 mg.  Pork sausage -- 1 link (1 oz) has 17 mg.  Salmon -- 1 oz has 16 mg.  Tilapia -- 1 oz has 14 mg. Dairy  Soft-serve ice cream --  cup (4 oz) has 103 mg.  Whole-milk yogurt -- 1 cup (8 oz) has 29 mg.  Cheddar cheese -- 1 oz has 28 mg.  American cheese -- 1 oz has 28 mg.  Whole milk -- 1 cup (8 oz) has 23 mg.  2% milk -- 1 cup (8 oz) has 18 mg.  Cream cheese -- 1 tablespoon (Tbsp) has 15 mg.  Cottage cheese --  cup (4 oz) has 14 mg.  Low-fat (1%) milk -- 1 cup (8 oz) has 10 mg.  Sour cream -- 1 Tbsp has 8.5 mg.  Low-fat yogurt -- 1 cup (8 oz) has 8 mg.  Nonfat Greek yogurt -- 1 cup (8 oz) has 7 mg.  Half-and-half cream -- 1 Tbsp has 5 mg. Fats and oils  Cod liver oil -- 1 tablespoon (Tbsp) has 82 mg.  Butter -- 1 Tbsp has 15 mg.  Lard -- 1 Tbsp has 14 mg.  Bacon grease -- 1 Tbsp has 14 mg.  Mayonnaise -- 1 Tbsp has 5-10 mg.  Margarine -- 1 Tbsp has 3-10 mg. Exact amounts of cholesterol in these foods may vary depending on specific ingredients and brands. Foods without cholesterol Most plant-based foods do not have cholesterol unless you combine  them with a food that has cholesterol. Foods without cholesterol include:  Grains and cereals.  Vegetables.  Fruits.  Vegetable oils, such as olive, canola, and sunflower oil.  Legumes, such as peas, beans, and lentils.  Nuts and seeds.  Egg whites. Summary  The body needs cholesterol in small amounts, but too much cholesterol can cause damage to the arteries and heart.  Most people should eat less than 200 milligrams (mg)  of cholesterol a day. This information is not intended to replace advice given to you by your health care provider. Make sure you discuss any questions you have with your health care provider. Document Released: 02/26/2017 Document Revised: 06/14/2017 Document Reviewed: 02/26/2017 Elsevier Patient Education  [place].    High Cholesterol  High cholesterol is a condition in which the blood has high levels of a white, waxy, fat-like substance (cholesterol). The human body needs small amounts of cholesterol. The liver makes all the cholesterol that the body needs. Extra (excess) cholesterol comes from the food that we eat. Cholesterol is carried from the liver by the blood through the blood vessels. If you have high cholesterol, deposits (plaques) may build up on the walls of your blood vessels (arteries). Plaques make the arteries narrower and stiffer. Cholesterol plaques increase your risk for heart attack and stroke. Work with your health care provider to keep your cholesterol levels in a healthy range. What increases the risk? This condition is more likely to develop in people who:  Eat foods that are high in animal fat (saturated fat) or cholesterol.  Are overweight.  Are not getting enough exercise.  Have a family history of high cholesterol. What are the signs or symptoms? There are no symptoms of this condition. How is this diagnosed? This condition may be diagnosed from the results of a blood test.  If you are older than age 41, your  health care provider may check your cholesterol every 4-6 years.  You may be checked more often if you already have high cholesterol or other risk factors for heart disease. The blood test for cholesterol measures:  "Bad" cholesterol (LDL cholesterol). This is the main type of cholesterol that causes heart disease. The desired level for LDL is less than 100.  "Good" cholesterol (HDL cholesterol). This type helps to protect against heart disease by cleaning the arteries and carrying the LDL away. The desired level for HDL is 60 or higher.  Triglycerides. These are fats that the body can store or burn for energy. The desired number for triglycerides is lower than 150.  Total cholesterol. This is a measure of the total amount of cholesterol in your blood, including LDL cholesterol, HDL cholesterol, and triglycerides. A healthy number is less than 200. How is this treated? This condition is treated with diet changes, lifestyle changes, and medicines. Diet changes  This may include eating more whole grains, fruits, vegetables, nuts, and fish.  This may also include cutting back on red meat and foods that have a lot of added sugar. Lifestyle changes  Changes may include getting at least 40 minutes of aerobic exercise 3 times a week. Aerobic exercises include walking, biking, and swimming. Aerobic exercise along with a healthy diet can help you maintain a healthy weight.  Changes may also include quitting smoking. Medicines  Medicines are usually given if diet and lifestyle changes have failed to reduce your cholesterol to healthy levels.  Your health care provider may prescribe a statin medicine. Statin medicines have been shown to reduce cholesterol, which can reduce the risk of heart disease. Follow these instructions at home: Eating and drinking If told by your health care provider:  Eat chicken (without skin), fish, veal, shellfish, ground [place] breast, and round or loin cuts of red  meat.  Do not eat fried foods or fatty meats, such as hot dogs and salami.  Eat plenty of fruits, such as apples.  Eat plenty of vegetables, such as broccoli,  potatoes, and carrots.  Eat beans, peas, and lentils.  Eat grains such as barley, rice, couscous, and bulgur wheat.  Eat pasta without cream sauces.  Use skim or nonfat milk, and eat low-fat or nonfat yogurt and cheeses.  Do not eat or drink whole milk, cream, ice cream, egg yolks, or hard cheeses.  Do not eat stick margarine or tub margarines that contain trans fats (also called partially hydrogenated oils).  Do not eat saturated tropical oils, such as coconut oil and palm oil.  Do not eat cakes, cookies, crackers, or other baked goods that contain trans fats.  General instructions  Exercise as directed by your health care provider. Increase your activity level with activities such as gardening, walking, and taking the stairs.  Take over-the-counter and prescription medicines only as told by your health care provider.  Do not use any products that contain nicotine or tobacco, such as cigarettes and e-cigarettes. If you need help quitting, ask your health care provider.  Keep all follow-up visits as told by your health care provider. This is important. Contact a health care provider if:  You are struggling to maintain a healthy diet or weight.  You need help to start on an exercise program.  You need help to stop smoking. Get help right away if:  You have chest pain.  You have trouble breathing. This information is not intended to replace advice given to you by your health care provider. Make sure you discuss any questions you have with your health care provider. Document Released: 07/02/2005 Document Revised: 07/05/2017 Document Reviewed: 12/31/2015 Elsevier Patient Education  2020 [employer].

## 2019-07-14 NOTE — Telephone Encounter (Signed)
Thrombocytopenia ([place]) -will CC Dr. Grayland Ormond to try to resch CT bone marrow bx with aspiration asap pt off from work 12/30-07/22/19 and has 3 weeks of vacation and motivated to get this done  Stressed importance with plts being 30K   Can you have your staff reschedule this?   Thanks [employer]

## 2019-07-14 NOTE — Progress Notes (Signed)
Virtual Visit via Video Note  I connected with Carly [name]   on 07/14/19 at  4:27 PM EST by a video enabled telemedicine application and verified that I am speaking with the correct person using two identifiers.  Location patient: home Location provider:work or home office Persons participating in the virtual visit: patient, provider  I discussed the limitations of evaluation and management by telemedicine and the availability of in person appointments. The patient expressed understanding and agreed to proceed.   HPI: 1. HTN on norvasc 10 mg qd coreg 25 mg bid, spironolactone 25 mg qd  2. Thrombocytopenia plts 30K overdue for CT bone marrow bx and aspiration will try to coordinate with Dr. Grayland Ormond pt ready to schedule and realizes the importance of this asap she is off work 07/15/2019 to 07/22/19 and has 3 weeks of vacation days scheduled  3. Overactive bladder myrbetriq is helping 25 mg qd  4. Anxiety/depression and insomnia lexapro 20 mg and trazadone are helping and mood and sleep are better  5. Energy better with B12 injections now transitioned to Q30 days  6. C/o wart skin lesions to arms and legs increasing in size and changing color she will my chart photo  7. Tobacco abuse smoking 1.5 ppd wants to quit with 3 other family members and they made a pact she wants gum or lozengers to try to try to quit   ROS: See pertinent positives and negatives per HPI.  Past Medical History:  Diagnosis Date  . Anxiety   . Chicken pox   . Depression   . Elevated testosterone level in female   . Emphysema of lung (H[facility])    bronchitis  . Frequent headaches   . Hay fever   . Hypertension     Past Surgical History:  Procedure Laterality Date  . HYSTEROSCOPY WITH NOVASURE N/A 02/21/2018   Procedure: HYSTEROSCOPY WITH NOVASURE, POLYPECTOMY;  Surgeon: Benjaman Kindler, MD;  Location: [facility] ORS;  Service: Gynecology;  Laterality: N/A;  . TUBAL LIGATION  1997  . TUBAL LIGATION      Family History   Problem Relation Age of Onset  . Hyperlipidemia Mother   . Heart disease Mother   . Stroke Mother   . Depression Mother   . Mental illness Mother   . Heart attack Mother 11  . Hyperlipidemia Father   . Depression Father   . Mental illness Father   . Diabetes Paternal Grandfather   . Cancer Cousin     SOCIAL HX:  Married husband Alvester Chou    Current Outpatient Medications:  .  albuterol (PROVENTIL) (2.5 MG/3ML) 0.083% nebulizer solution, Take 3 mLs (2.5 mg total) by nebulization every 6 (six) hours as needed for wheezing or shortness of breath., Disp: 150 mL, Rfl: 1 .  amLODipine (NORVASC) 10 MG tablet, Take 1 tablet (10 mg total) by mouth daily., Disp: 90 tablet, Rfl: 3 .  budesonide-formoterol (SYMBICORT) 160-4.5 MCG/ACT inhaler, Inhale 2 puffs into the lungs 2 (two) times daily., Disp: 1 Inhaler, Rfl: 6 .  carvedilol (COREG) 25 MG tablet, Take 1 tablet (25 mg total) by mouth 2 (two) times daily with a meal., Disp: 60 tablet, Rfl: 3 .  cetirizine (ZYRTEC) 10 MG tablet, Take 1 tablet (10 mg total) by mouth daily., Disp: 30 tablet, Rfl: 11 .  Cholecalciferol 1.25 MG (50000 UT) capsule, Take 1 capsule (50,000 Units total) by mouth once a week., Disp: 13 capsule, Rfl: 1 .  cyanocobalamin (,VITAMIN B-12,) 1000 MCG/ML injection, 1 mL 1x per  week x 4 weeks then every 30 days, Disp: 8 mL, Rfl: 3 .  escitalopram (LEXAPRO) 20 MG tablet, Take 1 tablet (20 mg total) by mouth daily., Disp: 90 tablet, Rfl: 3 .  fluticasone (FLONASE) 50 MCG/ACT nasal spray, Place 2 sprays into both nostrils daily., Disp: 16 g, Rfl: 6 .  ibuprofen (ADVIL,MOTRIN) 200 MG tablet, Take 400-600 mg by mouth every 6 (six) hours as needed for headache., Disp: , Rfl:  .  mirabegron ER (MYRBETRIQ) 25 MG TB24 tablet, Take 1 tablet (25 mg total) by mouth daily., Disp: 90 tablet, Rfl: 3 .  OVER THE COUNTER MEDICATION, Take 2 drops by mouth at bedtime. CBD Oil - takes for sleep, Disp: , Rfl:  .  phenazopyridine (PYRIDIUM) 100 MG  tablet, Take 1 tablet (100 mg total) by mouth 3 (three) times daily as needed for pain., Disp: 10 tablet, Rfl: 0 .  spironolactone (ALDACTONE) 25 MG tablet, Take 1 tablet (25 mg total) by mouth daily., Disp: 90 tablet, Rfl: 1 .  Syringe/Needle, Disp, 25G X 1-1/2" 1 ML MISC, 1 Device by Does not apply route as directed., Disp: 30 each, Rfl: 11 .  traZODone (DESYREL) 50 MG tablet, Take 0.5-1 tablets (25-50 mg total) by mouth at bedtime as needed for sleep., Disp: 90 tablet, Rfl: 3 .  vitamin B-12 (CYANOCOBALAMIN) 1000 MCG tablet, Take 1,000 mcg by mouth daily., Disp: , Rfl:  .  nicotine polacrilex (NICORETTE) 4 MG gum, Take 1 each (4 mg total) by mouth as needed for smoking cessation. Chew 1 piece every 1-2 hours max 24 pieces per day x 1-6 weeks then week 7-9 every 2-4 hours and week 10-12 every 4-8 hours, Disp: 300 tablet, Rfl: 5  EXAM:  VITALS per patient if applicable:  GENERAL: alert, oriented, appears well and in no acute distress  HEENT: atraumatic, conjunttiva clear, no obvious abnormalities on inspection of external nose and ears  NECK: normal movements of the head and neck  LUNGS: on inspection no signs of respiratory distress, breathing rate appears normal, no obvious gross SOB, gasping or wheezing  CV: no obvious cyanosis  MS: moves all visible extremities without noticeable abnormality  PSYCH/NEURO: pleasant and cooperative, no obvious depression or anxiety, speech and thought processing grossly intact  ASSESSMENT AND PLAN:  Discussed the following assessment and plan:  Thrombocytopenia ([place]) -will CC Dr. Grayland Ormond to try to resch CT bone marrow bx with aspiration asap pt off 12/30-07/22/19 and has 3 weeks of vacation and motivated to get this done  Stressed importance with plts being 30K   Tobacco abuse - Plan: nicotine polacrilex (NICORETTE) 4 MG gum hold other cessation tx for now due to low plts  Call [phone] quit now  Generalized anxiety disorder/insomnia Cont lexapro  and trazadone helping  Essential hypertension -BP controlled cont meds   B12 deficiency Q30 days B12 infections   Overactive bladder Myrbetriq 25 mg qd is helping   Skin lesion Pt to sent my chart photos will see if needs to see dermatology if warts/Sks vs other   HM Flu shot disc at f/u  tdap utd due in 2026   Pap overdue sch Dr. Smitty Knudsen s/p ablation and cervical polyps  -she will call [facility] ob/gyn Dr. Leonides Schanz in [place] and get this scheduled due for pap   mammo referred prev given # to call and schedule   Colonoscopy consider in future [facility] female address low plts 1st   Skin assess at f/u pt to my chart me  pics of skin   -we discussed possible serious and likely etiologies, options for evaluation and workup, limitations of telemedicine visit vs in person visit, treatment, treatment risks and precautions. Pt prefers to treat via telemedicine empirically rather then risking or undertaking an in person visit at this moment. Patient agrees to seek prompt in person care if worsening, new symptoms arise, or if is not improving with treatment.   I discussed the assessment and treatment plan with the patient. The patient was provided an opportunity to ask questions and all were answered. The patient agreed with the plan and demonstrated an understanding of the instructions.   The patient was advised to call back or seek an in-person evaluation if the symptoms worsen or if the condition fails to improve as anticipated.  Time spent 25 minutes  Delorise Jackson, MD

## 2019-07-15 ENCOUNTER — Encounter: Payer: Self-pay | Admitting: Internal Medicine

## 2019-07-15 NOTE — Telephone Encounter (Signed)
It is unlikely IR will be able to do the biopsy in that time frame.  It normally takes about 10 days to get on the schedule for a BMBx and I'm sure the holidays are making that even difficult.  I can see her back in the clinic the first week of January to evaluate since I believe she has not been seen since March.

## 2019-07-15 NOTE — Telephone Encounter (Signed)
Understandably so whatever needs to be done to get this coordinated   Thanks [facility]

## 2019-07-20 ENCOUNTER — Telehealth: Payer: Self-pay | Admitting: Emergency Medicine

## 2019-07-20 ENCOUNTER — Ambulatory Visit
Admission: RE | Admit: 2019-07-20 | Discharge: 2019-07-20 | Disposition: A | Discharge status: Home / Self Care | Payer: BC Managed Care – PPO | Source: Ambulatory Visit | Attending: Internal Medicine | Admitting: Internal Medicine

## 2019-07-20 DIAGNOSIS — Z1231 Encounter for screening mammogram for malignant neoplasm of breast: Secondary | ICD-10-CM | POA: Insufficient documentation

## 2019-07-20 NOTE — Telephone Encounter (Signed)
Called patient to let her know that BMBX has been scheduled. Pt stated that it had just came across on her mychart, but she appreciated the call.

## 2019-07-20 NOTE — Progress Notes (Signed)
Patient on schedule for BMB 07/22/19 , called to confirm time of 0730 to arrive, NPO after MN prior to procedure. Will have husband drive home after recovery/procedure. Stated understanding.

## 2019-07-21 ENCOUNTER — Other Ambulatory Visit: Payer: Self-pay | Admitting: Radiology

## 2019-07-21 ENCOUNTER — Other Ambulatory Visit: Payer: Self-pay | Admitting: Student

## 2019-07-22 ENCOUNTER — Ambulatory Visit: Payer: BC Managed Care – PPO

## 2019-07-22 ENCOUNTER — Other Ambulatory Visit: Payer: Self-pay

## 2019-07-22 ENCOUNTER — Ambulatory Visit
Admission: RE | Admit: 2019-07-22 | Discharge: 2019-07-22 | Disposition: A | Discharge status: Home / Self Care | Payer: BC Managed Care – PPO | Source: Ambulatory Visit | Attending: Oncology | Admitting: Oncology

## 2019-07-22 DIAGNOSIS — F419 Anxiety disorder, unspecified: Secondary | ICD-10-CM | POA: Diagnosis not present

## 2019-07-22 DIAGNOSIS — D696 Thrombocytopenia, unspecified: Secondary | ICD-10-CM | POA: Insufficient documentation

## 2019-07-22 DIAGNOSIS — J439 Emphysema, unspecified: Secondary | ICD-10-CM | POA: Insufficient documentation

## 2019-07-22 DIAGNOSIS — Z823 Family history of stroke: Secondary | ICD-10-CM | POA: Diagnosis not present

## 2019-07-22 DIAGNOSIS — Z9851 Tubal ligation status: Secondary | ICD-10-CM | POA: Diagnosis not present

## 2019-07-22 DIAGNOSIS — Z886 Allergy status to analgesic agent status: Secondary | ICD-10-CM | POA: Insufficient documentation

## 2019-07-22 DIAGNOSIS — I10 Essential (primary) hypertension: Secondary | ICD-10-CM | POA: Insufficient documentation

## 2019-07-22 DIAGNOSIS — Z833 Family history of diabetes mellitus: Secondary | ICD-10-CM | POA: Diagnosis not present

## 2019-07-22 DIAGNOSIS — Z8249 Family history of ischemic heart disease and other diseases of the circulatory system: Secondary | ICD-10-CM | POA: Insufficient documentation

## 2019-07-22 DIAGNOSIS — F17210 Nicotine dependence, cigarettes, uncomplicated: Secondary | ICD-10-CM | POA: Insufficient documentation

## 2019-07-22 DIAGNOSIS — Z809 Family history of malignant neoplasm, unspecified: Secondary | ICD-10-CM | POA: Diagnosis not present

## 2019-07-22 DIAGNOSIS — Z818 Family history of other mental and behavioral disorders: Secondary | ICD-10-CM | POA: Diagnosis not present

## 2019-07-22 DIAGNOSIS — F329 Major depressive disorder, single episode, unspecified: Secondary | ICD-10-CM | POA: Diagnosis not present

## 2019-07-22 DIAGNOSIS — Z79899 Other long term (current) drug therapy: Secondary | ICD-10-CM | POA: Diagnosis not present

## 2019-07-22 DIAGNOSIS — D751 Secondary polycythemia: Secondary | ICD-10-CM | POA: Diagnosis not present

## 2019-07-22 LAB — CBC WITH DIFFERENTIAL/PLATELET
Abs Immature Granulocytes: 0.03 10*3/uL (ref 0.00–0.07)
Basophils Absolute: 0 10*3/uL (ref 0.0–0.1)
Basophils Relative: 1 %
Eosinophils Absolute: 0.1 10*3/uL (ref 0.0–0.5)
Eosinophils Relative: 2 %
HCT: 47 % — ABNORMAL HIGH (ref 36.0–46.0)
Hemoglobin: 16 g/dL — ABNORMAL HIGH (ref 12.0–15.0)
Immature Granulocytes: 0 %
Lymphocytes Relative: 18 %
Lymphs Abs: 1.3 10*3/uL (ref 0.7–4.0)
MCH: 34 pg (ref 26.0–34.0)
MCHC: 34 g/dL (ref 30.0–36.0)
MCV: 100 fL (ref 80.0–100.0)
Monocytes Absolute: 0.7 10*3/uL (ref 0.1–1.0)
Monocytes Relative: 10 %
Neutro Abs: 5.1 10*3/uL (ref 1.7–7.7)
Neutrophils Relative %: 69 %
Platelets: 29 10*3/uL — CL (ref 150–400)
RBC: 4.7 MIL/uL (ref 3.87–5.11)
RDW: 12.4 % (ref 11.5–15.5)
WBC: 7.2 10*3/uL (ref 4.0–10.5)
nRBC: 0 % (ref 0.0–0.2)

## 2019-07-22 LAB — PROTIME-INR
INR: 0.9 (ref 0.8–1.2)
Prothrombin Time: 12.5 seconds (ref 11.4–15.2)

## 2019-07-22 MED ORDER — ALBUTEROL SULFATE (2.5 MG/3ML) 0.083% IN NEBU
2.5000 mg | INHALATION_SOLUTION | Freq: Once | RESPIRATORY_TRACT | Status: AC
  Administered 2019-07-22: 2.5 mg via RESPIRATORY_TRACT
  Filled 2019-07-22: qty 3

## 2019-07-22 MED ORDER — LORAZEPAM 2 MG/ML IJ SOLN: INTRAMUSCULAR | Status: AC | PRN

## 2019-07-22 MED ORDER — MIDAZOLAM HCL 5 MG/5ML IJ SOLN
INTRAMUSCULAR | Status: AC | PRN
  Administered 2019-07-22 (×2): 1 mg via INTRAVENOUS

## 2019-07-22 MED ORDER — FENTANYL CITRATE (PF) 100 MCG/2ML IJ SOLN
INTRAMUSCULAR | Status: AC | PRN
  Administered 2019-07-22 (×2): 50 ug via INTRAVENOUS

## 2019-07-22 MED ORDER — HEPARIN SOD (PORK) LOCK FLUSH 100 UNIT/ML IV SOLN
INTRAVENOUS | Status: AC
  Filled 2019-07-22: qty 5

## 2019-07-22 MED ORDER — FENTANYL CITRATE (PF) 100 MCG/2ML IJ SOLN
INTRAMUSCULAR | Status: AC
  Filled 2019-07-22: qty 2

## 2019-07-22 MED ORDER — ALBUTEROL SULFATE (2.5 MG/3ML) 0.083% IN NEBU
INHALATION_SOLUTION | RESPIRATORY_TRACT | Status: AC
  Filled 2019-07-22: qty 3

## 2019-07-22 MED ORDER — MIDAZOLAM HCL 5 MG/5ML IJ SOLN
INTRAMUSCULAR | Status: AC
  Filled 2019-07-22: qty 5

## 2019-07-22 MED ORDER — SODIUM CHLORIDE 0.9 % IV SOLN: INTRAVENOUS | Status: DC

## 2019-07-22 NOTE — Discharge Instructions (Signed)
Bone Marrow Aspiration and Bone Marrow Biopsy, Adult, Care After This sheet gives you information about how to care for yourself after your procedure. If you have problems or questions, contact your health care provider.  What can I expect after the procedure?  After the procedure, it is common to have:  Mild pain and tenderness.  Swelling.  Bruising.  Follow these instructions at home:  Take over-the-counter or prescription medicines only as told by your health care provider.  You may shower tomorrow ? Remove band aid tomorrow, replace with another bandaid if  site has any drainage from biopsy site. ? Wash your hands with soap and water before you touch your biopsy site  If soap and water are not available, use hand sanitizer. ? Change your dressing frequently for bleeding and/or drainage.  Check your puncture site every day for signs of infection. Check for: ? More redness, swelling, or pain. ? More fluid or blood. ? Warmth. ? Pus or a bad smell.  Return to your normal activities in 24hours.   Do not drive for 24 hours if you were given a medicine to help you relax (sedative).  Keep all follow-up visits as told by your health care provider. This is important. Contact a health care provider if:  You have more redness, swelling, or pain around the puncture site.  You have more fluid or blood coming from the puncture site.  Your puncture site feels warm to the touch.  You have pus or a bad smell coming from the puncture site.  You have a fever.  Your pain is not controlled with medicine. This information is not intended to replace advice given to you by your health care provider. Make sure you discuss any questions you have with your health care provider. Document Released: 01/19/2005 Document Revised: 01/20/2016 Document Reviewed: 12/14/2015 Elsevier Interactive Patient Education  2018 [employer].

## 2019-07-22 NOTE — Consult Note (Signed)
Chief Complaint: Patient was seen in consultation today for CT-guided bone marrow biopsy.    Referring Physician(s): Finnegan,Timothy J  Patient Status: [facility] - Out-pt  History of Present Illness: Maxi ZABDI MIS is a 49 y.o. female with thrombocytopenia and referred for CT-guided bone marrow biopsy.  Past medical history is significant for tobacco use and asthma.  Patient presents with shortness of breath and feels better after nebulizer treatment this morning.  She is very anxious about the procedure.  She denies chest pain,  abdominal pain and neurologic symptoms.  She says that she is going to quit smoking today.  Past Medical History:  Diagnosis Date  . Anxiety   . Chicken pox   . Depression   . Elevated testosterone level in female   . Emphysema of lung (HCC)    bronchitis  . Frequent headaches   . Hay fever   . Hypertension     Past Surgical History:  Procedure Laterality Date  . HYSTEROSCOPY WITH NOVASURE N/A 02/21/2018   Procedure: HYSTEROSCOPY WITH NOVASURE, POLYPECTOMY;  Surgeon: Benjaman Kindler, MD;  Location: [facility] ORS;  Service: Gynecology;  Laterality: N/A;  . TUBAL LIGATION  1997  . TUBAL LIGATION      Allergies: Meloxicam  Medications: Prior to Admission medications   Medication Sig Start Date End Date Taking? Authorizing Provider  albuterol (PROVENTIL) (2.5 MG/3ML) 0.083% nebulizer solution Take 3 mLs (2.5 mg total) by nebulization every 6 (six) hours as needed for wheezing or shortness of breath. 10/08/18  Yes Leone Haven, MD  amLODipine (NORVASC) 10 MG tablet Take 1 tablet (10 mg total) by mouth daily. 02/17/18  Yes Leone Haven, MD  budesonide-formoterol [facility]) 160-4.5 MCG/ACT inhaler Inhale 2 puffs into the lungs 2 (two) times daily. 08/04/18  Yes Leone Haven, MD  carvedilol (COREG) 25 MG tablet Take 1 tablet (25 mg total) by mouth 2 (two) times daily with a meal. 08/06/18  Yes Leone Haven, MD  cetirizine (ZYRTEC) 10 MG  tablet Take 1 tablet (10 mg total) by mouth daily. 01/30/19  Yes Leone Haven, MD  Cholecalciferol 1.25 MG (50000 UT) capsule Take 1 capsule (50,000 Units total) by mouth once a week. 06/10/19  Yes McLean-Scocuzza, Nino Glow, MD  cyanocobalamin (,VITAMIN B-12,) 1000 MCG/ML injection 1 mL 1x per week x 4 weeks then every 30 days 06/15/19  Yes McLean-Scocuzza, Nino Glow, MD  escitalopram (LEXAPRO) 20 MG tablet Take 1 tablet (20 mg total) by mouth daily. 05/22/19  Yes McLean-Scocuzza, Nino Glow, MD  fluticasone (FLONASE) 50 MCG/ACT nasal spray Place 2 sprays into both nostrils daily. 04/02/19  Yes Hedges, Dellis Filbert, PA-C  ibuprofen (ADVIL,MOTRIN) 200 MG tablet Take 400-600 mg by mouth every 6 (six) hours as needed for headache.   Yes [provider]  mirabegron ER (MYRBETRIQ) 25 MG TB24 tablet Take 1 tablet (25 mg total) by mouth daily. 05/22/19  Yes McLean-Scocuzza, Nino Glow, MD  nicotine polacrilex (NICORETTE) 4 MG gum Take 1 each (4 mg total) by mouth as needed for smoking cessation. Chew 1 piece every 1-2 hours max 24 pieces per day x 1-6 weeks then week 7-9 every 2-4 hours and week 10-12 every 4-8 hours 07/14/19  Yes McLean-Scocuzza, Nino Glow, MD  OVER THE COUNTER MEDICATION Take 2 drops by mouth at bedtime. CBD Oil - takes for sleep   Yes [provider]  phenazopyridine (PYRIDIUM) 100 MG tablet Take 1 tablet (100 mg total) by mouth 3 (three) times daily as needed  for pain. 05/08/19  Yes Guse, Jacquelynn Cree, FNP  spironolactone (ALDACTONE) 25 MG tablet Take 1 tablet (25 mg total) by mouth daily. 02/17/18  Yes Leone Haven, MD  Syringe/Needle, Disp, 25G X 1-1/2" 1 ML MISC 1 Device by Does not apply route as directed. 06/15/19  Yes McLean-Scocuzza, Nino Glow, MD  traZODone (DESYREL) 50 MG tablet Take 0.5-1 tablets (25-50 mg total) by mouth at bedtime as needed for sleep. 05/22/19  Yes McLean-Scocuzza, Nino Glow, MD  vitamin B-12 (CYANOCOBALAMIN) 1000 MCG tablet Take 1,000 mcg by mouth daily.   Yes  [provider]     Family History  Problem Relation Age of Onset  . Hyperlipidemia Mother   . Heart disease Mother   . Stroke Mother   . Depression Mother   . Mental illness Mother   . Heart attack Mother 84  . Hyperlipidemia Father   . Depression Father   . Mental illness Father   . Diabetes Paternal Grandfather   . Cancer Cousin     Social History   Socioeconomic History  . Marital status: Married    Spouse name: Not on file  . Number of children: Not on file  . Years of education: Not on file  . Highest education level: Not on file  Occupational History  . Not on file  Tobacco Use  . Smoking status: Current Every Day Smoker    Packs/day: 0.50    Years: 28.00    Pack years: 14.00    Types: Cigarettes  . Smokeless tobacco: Never Used  Substance and Sexual Activity  . Alcohol use: Yes    Alcohol/week: 8.0 standard drinks    Types: 8 Standard drinks or equivalent per week    Comment: Beer (Can)   . Drug use: No    Comment: CBD oil  . Sexual activity: Yes    Partners: Male  Other Topics Concern  . Not on file  Social History Narrative   1 Year college    Works in a [place]   Married to [place]    Enjoys coloring    Pets: Dog lives inside   Children: 73 Son 26 Son   Social Determinants of Radio broadcast assistant Strain:   . Difficulty of Paying Living Expenses: Not on file  Food Insecurity:   . Worried About Charity fundraiser in the Last Year: Not on file  . Ran Out of Food in the Last Year: Not on file  Transportation Needs:   . Lack of Transportation (Medical): Not on file  . Lack of Transportation (Non-Medical): Not on file  Physical Activity:   . Days of Exercise per Week: Not on file  . Minutes of Exercise per Session: Not on file  Stress:   . Feeling of Stress : Not on file  Social Connections:   . Frequency of Communication with Friends and Family: Not on file  . Frequency of Social Gatherings with  Friends and Family: Not on file  . Attends Religious Services: Not on file  . Active Member of Clubs or Organizations: Not on file  . Attends Archivist Meetings: Not on file  . Marital Status: Not on file    Review of Systems: A 12 point ROS discussed and pertinent positives are indicated in the HPI above.  All other systems are negative.  Review of Systems  Constitutional: Negative.   Respiratory: Positive for shortness of breath and wheezing.   Cardiovascular:  Negative.   Gastrointestinal: Negative.   Genitourinary: Negative.   Neurological: Negative.     Vital Signs: BP (!) 143/97   Pulse 93   Temp 98.5 F (36.9 C) (Oral)   Resp 14   Ht 5' 3"  (1.6 m)   Wt 90.7 kg   LMP 01/13/2018 Comment: tubal ligation  SpO2 98%   BMI 35.43 kg/m   Physical Exam Constitutional:      Appearance: Normal appearance.  Cardiovascular:     Rate and Rhythm: Normal rate and regular rhythm.  Pulmonary:     Effort: Pulmonary effort is normal.     Breath sounds: Wheezing present.  Abdominal:     General: Abdomen is flat.     Palpations: Abdomen is soft.  Neurological:     Mental Status: She is alert.     Imaging: MM 3D SCREEN BREAST BILATERAL  Result Date: 07/20/2019 CLINICAL DATA:  Screening. EXAM: DIGITAL SCREENING BILATERAL MAMMOGRAM WITH TOMO AND CAD COMPARISON:  None. ACR Breast Density Category b: There are scattered areas of fibroglandular density. FINDINGS: There are no findings suspicious for malignancy. Images were processed with CAD. IMPRESSION: No mammographic evidence of malignancy. A result letter of this screening mammogram will be mailed directly to the patient. RECOMMENDATION: Screening mammogram in one year. (Code:SM-B-01Y) BI-RADS CATEGORY  1: Negative. Electronically Signed   By: Nolon Nations M.D.   On: 07/20/2019 13:23    Labs:  CBC: Recent Labs    09/16/18 1517 09/30/18 1445 12/08/18 2056 06/10/19 0857  WBC 8.0 8.3 7.8 6.7  HGB 15.4 14.6 17.7*  16.1*  HCT 45.2* 43.3 51.8* 48.0*  PLT 40* 56* 85* 30.0 Repeated and verified X2.*    COAGS: No results for input(s): INR, APTT in the last 8760 hours.  BMP: Recent Labs    08/04/18 1658 09/30/18 1427 12/08/18 2056 06/10/19 0857  NA 139 136 141 141  K 3.5 3.3* 3.3* 3.6  CL 104 105 105 104  CO2 22 21* 25 27  GLUCOSE 96 92 131* 95  BUN 5* 8 <5* 4*  CALCIUM 9.0 8.2* 9.1 8.9  CREATININE 0.70 0.70 0.58 0.60  GFRNONAA  --  >60 >60  --   GFRAA  --  >60 >60  --     LIVER FUNCTION TESTS: Recent Labs    09/30/18 1427 12/08/18 2056 06/10/19 0857  BILITOT 0.7 0.5 0.7  AST 38 74* 34  ALT 30 38 21  ALKPHOS 82 97 73  PROT 7.1 7.6 6.4  ALBUMIN 3.9 4.3 3.8    TUMOR MARKERS: No results for input(s): AFPTM, CEA, CA199, CHROMGRNA in the last 8760 hours.  Assessment and Plan:  49 year old with thrombocytopenia and referred for a CT-guided bone marrow biopsy.  Risks and benefits of CT-guided bone marrow biopsy was discussed with the patient and/or patient's family including, but not limited to bleeding, infection, damage to adjacent structures or low yield requiring additional tests.  All of the questions were answered and there is agreement to proceed.  Consent signed and in chart.   Thank you for this interesting consult.  I greatly enjoyed meeting VERTA RIEDLINGER and look forward to participating in their care.  A copy of this report was sent to the requesting provider on this date.  Electronically Signed: Burman Riis, MD 07/22/2019, 8:17 AM   I spent a total of  15 Minutes   in face to face in clinical consultation, greater than 50% of which was counseling/coordinating care for bone marrow  biopsy.

## 2019-07-22 NOTE — Procedures (Signed)
Interventional Radiology Procedure:   Indications: Thrombocytopenia  Procedure: CT guided bone marrow biopsy  Findings: 2 aspirates and 1 core from right ilium  Complications: None     EBL: Minimal, less than 10 ml  Plan: Discharge to home in one hour.   Mekala Winger R. Sequita Wise, MD  Pager: [phone]   

## 2019-07-24 ENCOUNTER — Encounter: Payer: Self-pay | Admitting: Oncology

## 2019-07-28 ENCOUNTER — Other Ambulatory Visit: Payer: Self-pay | Admitting: Oncology

## 2019-07-28 LAB — SURGICAL PATHOLOGY

## 2019-07-29 ENCOUNTER — Encounter (HOSPITAL_COMMUNITY): Payer: Self-pay | Admitting: Oncology

## 2019-08-04 ENCOUNTER — Other Ambulatory Visit: Payer: Self-pay | Admitting: Emergency Medicine

## 2019-08-04 DIAGNOSIS — D696 Thrombocytopenia, unspecified: Secondary | ICD-10-CM

## 2019-08-05 ENCOUNTER — Encounter: Payer: Self-pay | Admitting: Oncology

## 2019-08-05 NOTE — Progress Notes (Signed)
Patient prescreened for appointment. Patient reports she just wants to know her results.

## 2019-08-06 ENCOUNTER — Inpatient Hospital Stay: Payer: BC Managed Care – PPO | Attending: Oncology

## 2019-08-06 ENCOUNTER — Other Ambulatory Visit: Payer: Self-pay

## 2019-08-06 ENCOUNTER — Inpatient Hospital Stay (HOSPITAL_BASED_OUTPATIENT_CLINIC_OR_DEPARTMENT_OTHER): Payer: BC Managed Care – PPO | Admitting: Oncology

## 2019-08-06 VITALS — BP 158/92 | HR 101 | Resp 16 | Wt 191.8 lb

## 2019-08-06 DIAGNOSIS — I10 Essential (primary) hypertension: Secondary | ICD-10-CM | POA: Diagnosis not present

## 2019-08-06 DIAGNOSIS — F17210 Nicotine dependence, cigarettes, uncomplicated: Secondary | ICD-10-CM | POA: Insufficient documentation

## 2019-08-06 DIAGNOSIS — D693 Immune thrombocytopenic purpura: Secondary | ICD-10-CM | POA: Diagnosis not present

## 2019-08-06 DIAGNOSIS — D696 Thrombocytopenia, unspecified: Secondary | ICD-10-CM | POA: Diagnosis not present

## 2019-08-06 DIAGNOSIS — Z79899 Other long term (current) drug therapy: Secondary | ICD-10-CM | POA: Insufficient documentation

## 2019-08-06 LAB — CBC WITH DIFFERENTIAL/PLATELET
Abs Immature Granulocytes: 0.01 10*3/uL (ref 0.00–0.07)
Basophils Absolute: 0.1 10*3/uL (ref 0.0–0.1)
Basophils Relative: 1 %
Eosinophils Absolute: 0.2 10*3/uL (ref 0.0–0.5)
Eosinophils Relative: 2 %
HCT: 48.7 % — ABNORMAL HIGH (ref 36.0–46.0)
Hemoglobin: 16 g/dL — ABNORMAL HIGH (ref 12.0–15.0)
Immature Granulocytes: 0 %
Lymphocytes Relative: 25 %
Lymphs Abs: 2 10*3/uL (ref 0.7–4.0)
MCH: 33.7 pg (ref 26.0–34.0)
MCHC: 32.9 g/dL (ref 30.0–36.0)
MCV: 102.5 fL — ABNORMAL HIGH (ref 80.0–100.0)
Monocytes Absolute: 0.7 10*3/uL (ref 0.1–1.0)
Monocytes Relative: 8 %
Neutro Abs: 5.1 10*3/uL (ref 1.7–7.7)
Neutrophils Relative %: 64 %
Platelets: 58 10*3/uL — ABNORMAL LOW (ref 150–400)
RBC: 4.75 MIL/uL (ref 3.87–5.11)
RDW: 12.2 % (ref 11.5–15.5)
WBC: 7.9 10*3/uL (ref 4.0–10.5)
nRBC: 0 % (ref 0.0–0.2)

## 2019-08-07 NOTE — Progress Notes (Signed)
[place]  [phone] [phone]  ID: Lin Landsman OB: 1971-06-28  MR#: [id number]  QIW#:[id number]  Patient Care Team: McLean-Scocuzza, Nino Glow, MD as PCP - General (Internal Medicine)  CHIEF COMPLAINT: ITP.  INTERVAL HISTORY: Patient returns to clinic today for further evaluation and discussion of her bone marrow biopsy results.  She continues to feel well and remains asymptomatic.  She has occasional bruising, but denies any easy bleeding. She has no neurologic complaints.  She denies any recent fevers or illnesses.  She has a good appetite and denies weight loss.  She denies any chest pain, shortness of breath, cough, or hemoptysis.  She denies any nausea, vomiting, constipation, or diarrhea.  She has no urinary complaints.  Patient offers no further specific complaints today.  REVIEW OF SYSTEMS:   Review of Systems  Constitutional: Negative.  Negative for fever, malaise/fatigue and weight loss.  Respiratory: Negative.  Negative for cough, hemoptysis and shortness of breath.   Cardiovascular: Negative.  Negative for chest pain and leg swelling.  Gastrointestinal: Negative.  Negative for abdominal pain, blood in stool and melena.  Genitourinary: Negative.  Negative for hematuria.  Musculoskeletal: Negative.  Negative for back pain.  Skin: Negative.  Negative for rash.  Neurological: Negative.  Negative for dizziness, focal weakness, weakness and headaches.  Endo/Heme/Allergies: Does not bruise/bleed easily.  Psychiatric/Behavioral: Negative.  The patient is not nervous/anxious.     As per HPI. Otherwise, a complete review of systems is negative.  PAST MEDICAL HISTORY: Past Medical History:  Diagnosis Date  . Anxiety   . Chicken pox   . Depression   . Elevated testosterone level in female   . Emphysema of lung (HCC)    bronchitis  . Frequent headaches   . Hay fever   . Hypertension     PAST SURGICAL HISTORY: Past Surgical  History:  Procedure Laterality Date  . HYSTEROSCOPY WITH NOVASURE N/A 02/21/2018   Procedure: HYSTEROSCOPY WITH NOVASURE, POLYPECTOMY;  Surgeon: Benjaman Kindler, MD;  Location: [facility] ORS;  Service: Gynecology;  Laterality: N/A;  . TUBAL LIGATION  1997  . TUBAL LIGATION      FAMILY HISTORY: Family History  Problem Relation Age of Onset  . Hyperlipidemia Mother   . Heart disease Mother   . Stroke Mother   . Depression Mother   . Mental illness Mother   . Heart attack Mother 10  . Hyperlipidemia Father   . Depression Father   . Mental illness Father   . Diabetes Paternal Grandfather   . Cancer Cousin     ADVANCED DIRECTIVES (Y/N):  N  HEALTH MAINTENANCE: Social History   Tobacco Use  . Smoking status: Current Every Day Smoker    Packs/day: 0.50    Years: 28.00    Pack years: 14.00    Types: Cigarettes  . Smokeless tobacco: Never Used  Substance Use Topics  . Alcohol use: Yes    Alcohol/week: 8.0 standard drinks    Types: 8 Standard drinks or equivalent per week    Comment: Beer (Can)   . Drug use: No    Comment: CBD oil     Colonoscopy:  PAP:  Bone density:  Lipid panel:  Allergies  Allergen Reactions  . Meloxicam Nausea Only    Current Outpatient Medications  Medication Sig Dispense Refill  . albuterol (PROVENTIL) (2.5 MG/3ML) 0.083% nebulizer solution Take 3 mLs (2.5 mg total) by nebulization every 6 (six) hours as needed for wheezing or shortness of breath. [place]  mL 1  . amLODipine (NORVASC) 10 MG tablet Take 1 tablet (10 mg total) by mouth daily. 90 tablet 3  . budesonide-formoterol (SYMBICORT) 160-4.5 MCG/ACT inhaler Inhale 2 puffs into the lungs 2 (two) times daily. 1 Inhaler 6  . carvedilol (COREG) 25 MG tablet Take 1 tablet (25 mg total) by mouth 2 (two) times daily with a meal. 60 tablet 3  . cetirizine (ZYRTEC) 10 MG tablet Take 1 tablet (10 mg total) by mouth daily. 30 tablet 11  . Cholecalciferol 1.25 MG (50000 UT) capsule Take 1 capsule (50,000  Units total) by mouth once a week. 13 capsule 1  . cyanocobalamin (,VITAMIN B-12,) 1000 MCG/ML injection 1 mL 1x per week x 4 weeks then every 30 days 8 mL 3  . dicyclomine (BENTYL) 20 MG tablet Take 20 mg by mouth 3 (three) times daily.    Marland Kitchen escitalopram (LEXAPRO) 20 MG tablet Take 1 tablet (20 mg total) by mouth daily. 90 tablet 3  . fluticasone (FLONASE) 50 MCG/ACT nasal spray Place 2 sprays into both nostrils daily. 16 g 6  . ibuprofen (ADVIL,MOTRIN) 200 MG tablet Take 400-600 mg by mouth every 6 (six) hours as needed for headache.    . mirabegron ER (MYRBETRIQ) 25 MG TB24 tablet Take 1 tablet (25 mg total) by mouth daily. 90 tablet 3  . nicotine polacrilex (NICORETTE) 4 MG gum Take 1 each (4 mg total) by mouth as needed for smoking cessation. Chew 1 piece every 1-2 hours max 24 pieces per day x 1-6 weeks then week 7-9 every 2-4 hours and week 10-12 every 4-8 hours 300 tablet 5  . OVER THE COUNTER MEDICATION Take 2 drops by mouth at bedtime. CBD Oil - takes for sleep    . phenazopyridine (PYRIDIUM) 100 MG tablet Take 1 tablet (100 mg total) by mouth 3 (three) times daily as needed for pain. 10 tablet 0  . spironolactone (ALDACTONE) 25 MG tablet Take 1 tablet (25 mg total) by mouth daily. 90 tablet 1  . Syringe/Needle, Disp, 25G X 1-1/2" 1 ML MISC 1 Device by Does not apply route as directed. 30 each 11  . traZODone (DESYREL) 50 MG tablet Take 0.5-1 tablets (25-50 mg total) by mouth at bedtime as needed for sleep. 90 tablet 3  . vitamin B-12 (CYANOCOBALAMIN) 1000 MCG tablet Take 1,000 mcg by mouth daily.     No current facility-administered medications for this visit.    OBJECTIVE: Vitals:   08/06/19 0924  BP: (!) 158/92  Pulse: (!) 101  Resp: 16  SpO2: 100%     Body mass index is 33.98 kg/m.    ECOG FS:0 - Asymptomatic  General: Well-developed, well-nourished, no acute distress. Eyes: Pink conjunctiva, anicteric sclera. HEENT: Normocephalic, moist mucous membranes. Lungs: No  audible wheezing or coughing. Heart: Regular rate and rhythm. Abdomen: Soft, nontender, no obvious distention. Musculoskeletal: No edema, cyanosis, or clubbing. Neuro: Alert, answering all questions appropriately. Cranial nerves grossly intact. Skin: No rashes or petechiae noted. Psych: Normal affect.  LAB RESULTS:  Lab Results  Component Value Date   NA 141 06/10/2019   K 3.6 06/10/2019   CL 104 06/10/2019   CO2 27 06/10/2019   GLUCOSE 95 06/10/2019   BUN 4 (L) 06/10/2019   CREATININE 0.60 06/10/2019   CALCIUM 8.9 06/10/2019   PROT 6.4 06/10/2019   ALBUMIN 3.8 06/10/2019   AST 34 06/10/2019   ALT 21 06/10/2019   ALKPHOS 73 06/10/2019   BILITOT 0.7 06/10/2019   GFRNONAA >  60 12/08/2018   GFRAA >60 12/08/2018    Lab Results  Component Value Date   WBC 7.9 08/06/2019   NEUTROABS 5.1 08/06/2019   HGB 16.0 (H) 08/06/2019   HCT 48.7 (H) 08/06/2019   MCV 102.5 (H) 08/06/2019   PLT 58 (L) 08/06/2019     STUDIES: CT BONE MARROW BIOPSY & ASPIRATION  Result Date: 07/22/2019 INDICATION: 49 year old with thrombocytopenia. EXAM: CT GUIDED BONE MARROW ASPIRATES AND BIOPSY Physician: Stephan Minister. Anselm Pancoast, MD MEDICATIONS: None. ANESTHESIA/SEDATION: Fentanyl 100 mcg IV; Versed 2.0 mg IV Moderate Sedation Time:  15 minutes The patient was continuously monitored during the procedure by the interventional radiology nurse under my direct supervision. COMPLICATIONS: None immediate. PROCEDURE: The procedure was explained to the patient. The risks and benefits of the procedure were discussed and the patient's questions were addressed. Informed consent was obtained from the patient. The patient was placed prone on CT table. Images of the pelvis were obtained. The right side of back was prepped and draped in sterile fashion. The skin and right posterior ilium were anesthetized with 1% lidocaine. 11 gauge bone needle was directed into the right ilium with CT guidance. Two aspirates and one core biopsy were  obtained. Bandage placed over the puncture site. IMPRESSION: CT guided bone marrow aspiration and core biopsy. Electronically Signed   By: Markus Daft M.D.   On: 07/22/2019 12:51   MM 3D SCREEN BREAST BILATERAL  Result Date: 07/20/2019 CLINICAL DATA:  Screening. EXAM: DIGITAL SCREENING BILATERAL MAMMOGRAM WITH TOMO AND CAD COMPARISON:  None. ACR Breast Density Category b: There are scattered areas of fibroglandular density. FINDINGS: There are no findings suspicious for malignancy. Images were processed with CAD. IMPRESSION: No mammographic evidence of malignancy. A result letter of this screening mammogram will be mailed directly to the patient. RECOMMENDATION: Screening mammogram in one year. (Code:SM-B-01Y) BI-RADS CATEGORY  1: Negative. Electronically Signed   By: Nolon Nations M.D.   On: 07/20/2019 13:23    ASSESSMENT: ITP  PLAN:    1.  ITP: Confirmed with normal bone marrow biopsy and normal FISH and cytogenetics on July 22, 2019.  Patient has a positive ANA which is likely clinically insignificant, but the remainder of her laboratory work is either negative or within normal limits. CT scan on March 04, 2017 did not reveal splenomegaly.  Patient's platelet count has increased to 58 which is approximately her baseline.  No intervention is needed at this time.  If patient requires treatment will initiate prednisone followed by Rituxan if necessary.  Return to clinic in 6 weeks for laboratory work only and then in 3 months for laboratory work and further evaluation. 2.  Positive ANA: Referral was previously sent to rheumatology.  I spent a total of 30 minutes reviewing chart data, face-to-face evaluation with the patient, counseling and coordination of care as detailed above.    Patient expressed understanding and was in agreement with this plan. She also understands that She can call clinic at any time with any questions, concerns, or complaints.    Lloyd Huger, MD   08/07/2019  6:35 AM

## 2019-09-09 DIAGNOSIS — M65862 Other synovitis and tenosynovitis, left lower leg: Secondary | ICD-10-CM | POA: Diagnosis not present

## 2019-09-16 ENCOUNTER — Encounter: Payer: Self-pay | Admitting: Internal Medicine

## 2019-09-17 ENCOUNTER — Other Ambulatory Visit: Payer: Self-pay

## 2019-09-17 ENCOUNTER — Inpatient Hospital Stay: Payer: BC Managed Care – PPO | Attending: Oncology

## 2019-09-17 DIAGNOSIS — D696 Thrombocytopenia, unspecified: Secondary | ICD-10-CM | POA: Insufficient documentation

## 2019-09-17 LAB — CBC WITH DIFFERENTIAL/PLATELET
Abs Immature Granulocytes: 0 10*3/uL (ref 0.00–0.07)
Basophils Absolute: 0 10*3/uL (ref 0.0–0.1)
Basophils Relative: 0 %
Eosinophils Absolute: 0.1 10*3/uL (ref 0.0–0.5)
Eosinophils Relative: 1 %
HCT: 45.6 % (ref 36.0–46.0)
Hemoglobin: 15 g/dL (ref 12.0–15.0)
Lymphocytes Relative: 25 %
Lymphs Abs: 2.2 10*3/uL (ref 0.7–4.0)
MCH: 33.3 pg (ref 26.0–34.0)
MCHC: 32.9 g/dL (ref 30.0–36.0)
MCV: 101.1 fL — ABNORMAL HIGH (ref 80.0–100.0)
Monocytes Absolute: 0.9 10*3/uL (ref 0.1–1.0)
Monocytes Relative: 10 %
Neutro Abs: 5.6 10*3/uL (ref 1.7–7.7)
Neutrophils Relative %: 64 %
Platelets: 52 10*3/uL — ABNORMAL LOW (ref 150–400)
RBC: 4.51 MIL/uL (ref 3.87–5.11)
RDW: 12.2 % (ref 11.5–15.5)
Smear Review: DECREASED
WBC: 8.7 10*3/uL (ref 4.0–10.5)
nRBC: 0 % (ref 0.0–0.2)

## 2019-09-17 NOTE — Telephone Encounter (Signed)
Spoke with patient and changed her 3/30 appointment to 3/16 at 1:00 pm.

## 2019-09-29 ENCOUNTER — Other Ambulatory Visit: Payer: Self-pay

## 2019-09-29 ENCOUNTER — Ambulatory Visit (INDEPENDENT_AMBULATORY_CARE_PROVIDER_SITE_OTHER): Payer: BC Managed Care – PPO | Admitting: Internal Medicine

## 2019-09-29 ENCOUNTER — Encounter: Payer: Self-pay | Admitting: Internal Medicine

## 2019-09-29 ENCOUNTER — Encounter: Payer: BC Managed Care – PPO | Admitting: Podiatry

## 2019-09-29 VITALS — BP 128/80 | HR 102 | Temp 97.5°F | Ht 63.0 in | Wt 189.8 lb

## 2019-09-29 DIAGNOSIS — J309 Allergic rhinitis, unspecified: Secondary | ICD-10-CM

## 2019-09-29 DIAGNOSIS — I10 Essential (primary) hypertension: Secondary | ICD-10-CM

## 2019-09-29 DIAGNOSIS — M722 Plantar fascial fibromatosis: Secondary | ICD-10-CM | POA: Diagnosis not present

## 2019-09-29 DIAGNOSIS — M79662 Pain in left lower leg: Secondary | ICD-10-CM

## 2019-09-29 DIAGNOSIS — J441 Chronic obstructive pulmonary disease with (acute) exacerbation: Secondary | ICD-10-CM | POA: Diagnosis not present

## 2019-09-29 DIAGNOSIS — M79661 Pain in right lower leg: Secondary | ICD-10-CM

## 2019-09-29 DIAGNOSIS — B079 Viral wart, unspecified: Secondary | ICD-10-CM | POA: Insufficient documentation

## 2019-09-29 DIAGNOSIS — N8110 Cystocele, unspecified: Secondary | ICD-10-CM

## 2019-09-29 DIAGNOSIS — M170 Bilateral primary osteoarthritis of knee: Secondary | ICD-10-CM | POA: Insufficient documentation

## 2019-09-29 DIAGNOSIS — Z124 Encounter for screening for malignant neoplasm of cervix: Secondary | ICD-10-CM

## 2019-09-29 DIAGNOSIS — B078 Other viral warts: Secondary | ICD-10-CM

## 2019-09-29 DIAGNOSIS — Z1283 Encounter for screening for malignant neoplasm of skin: Secondary | ICD-10-CM

## 2019-09-29 MED ORDER — PREDNISONE 20 MG PO TABS: 20.0000 mg | ORAL_TABLET | Freq: Every day | ORAL | 0 refills | Status: DC

## 2019-09-29 MED ORDER — SPIRONOLACTONE 25 MG PO TABS: 25.0000 mg | ORAL_TABLET | Freq: Every day | ORAL | 3 refills | Status: DC

## 2019-09-29 MED ORDER — CARVEDILOL 25 MG PO TABS: 25.0000 mg | ORAL_TABLET | Freq: Two times a day (BID) | ORAL | 3 refills | Status: DC

## 2019-09-29 MED ORDER — DICLOFENAC SODIUM 1 % EX GEL: 2.0000 g | Freq: Four times a day (QID) | CUTANEOUS | 11 refills | Status: DC

## 2019-09-29 MED ORDER — ALBUTEROL SULFATE (2.5 MG/3ML) 0.083% IN NEBU: 2.5000 mg | INHALATION_SOLUTION | Freq: Four times a day (QID) | RESPIRATORY_TRACT | 11 refills | Status: DC | PRN

## 2019-09-29 MED ORDER — FLUTICASONE PROPIONATE 50 MCG/ACT NA SUSP: 2.0000 | Freq: Every day | NASAL | 11 refills | Status: DC

## 2019-09-29 MED ORDER — CETIRIZINE HCL 10 MG PO TABS: 10.0000 mg | ORAL_TABLET | Freq: Every day | ORAL | 3 refills | Status: DC

## 2019-09-29 MED ORDER — AMLODIPINE BESYLATE 10 MG PO TABS: 10.0000 mg | ORAL_TABLET | Freq: Every day | ORAL | 3 refills | Status: DC

## 2019-09-29 MED ORDER — BUDESONIDE-FORMOTEROL FUMARATE 160-4.5 MCG/ACT IN AERO: 2.0000 | INHALATION_SPRAY | Freq: Two times a day (BID) | RESPIRATORY_TRACT | 11 refills | Status: DC

## 2019-09-29 NOTE — Progress Notes (Signed)
Chief Complaint  Patient presents with  . Knee Pain    left knee pain. 8/10 pain. Had an injection in this knee from [facility] but is not established there.   . Foot Pain    In both heels radiating into the arch. Mainly has the pain in the right foot. No known injuries    F/u  1. HTN on norvasc 10 mg qd and spironolactone 25 mg qd coreg 25 mg bid  2. Right and left knee pain L>R 5-6/10 steroid inj 09/09/19 helped some and pain in b/l calves 2-3/10  3. R> left heel/arch pain worse after working 8-10 hrs 7/10 working 10-12 hours on feet qd and worse by the end of the day  4. Low plts 56 per Dr. Finnegan will f/u in 4 months consider steroids and rituxin 5. Warts to left arm and left lower leg and right leg wants removed due to she picks at the lesions    Review of Systems  Constitutional: Negative for weight loss.  Respiratory: Negative for shortness of breath.   Cardiovascular: Negative for chest pain.  Musculoskeletal: Positive for joint pain.       +b/l heel pain  Skin: Negative for rash.       +warts   Past Medical History:  Diagnosis Date  . Anxiety   . Chicken pox   . Depression   . Elevated testosterone level in female   . Emphysema of lung (HCC)    bronchitis  . Frequent headaches   . Hay fever   . Hypertension    Past Surgical History:  Procedure Laterality Date  . HYSTEROSCOPY WITH NOVASURE N/A 02/21/2018   Procedure: HYSTEROSCOPY WITH NOVASURE, POLYPECTOMY;  Surgeon: Beasley, Bethany, MD;  Location: [facility];  Service: Gynecology;  Laterality: N/A;  . TUBAL LIGATION  1997  . TUBAL LIGATION     Family History  Problem Relation Age of Onset  . Hyperlipidemia Mother   . Heart disease Mother   . Stroke Mother   . Depression Mother   . Mental illness Mother   . Heart attack Mother 47  . Hyperlipidemia Father   . Depression Father   . Mental illness Father   . Diabetes Paternal Grandfather   . Cancer Cousin    Social History   Socioeconomic History  .  Marital status: Married    Spouse name: Not on file  . Number of children: Not on file  . Years of education: Not on file  . Highest education level: Not on file  Occupational History  . Not on file  Tobacco Use  . Smoking status: Current Every Day Smoker    Packs/day: 0.50    Years: 28.00    Pack years: 14.00    Types: Cigarettes  . Smokeless tobacco: Never Used  Substance and Sexual Activity  . Alcohol use: Yes    Alcohol/week: 8.0 standard drinks    Types: 8 Standard drinks or equivalent per week    Comment: Beer (Can)   . Drug use: No    Comment: CBD oil  . Sexual activity: Yes    Partners: Male  Other Topics Concern  . Not on file  Social History Narrative   1 Year college    Works in a Warehouse- [employer]   Married to Barry    Enjoys coloring    Pets: Dog lives inside   Children: 22 Son 18 Son   Social Determinants of Health   Financial Resource Strain:   .   Difficulty of Paying Living Expenses:   Food Insecurity:   . Worried About Running Out of Food in the Last Year:   . Ran Out of Food in the Last Year:   Transportation Needs:   . Lack of Transportation (Medical):   . Lack of Transportation (Non-Medical):   Physical Activity:   . Days of Exercise per Week:   . Minutes of Exercise per Session:   Stress:   . Feeling of Stress :   Social Connections:   . Frequency of Communication with Friends and Family:   . Frequency of Social Gatherings with Friends and Family:   . Attends Religious Services:   . Active Member of Clubs or Organizations:   . Attends Club or Organization Meetings:   . Marital Status:   Intimate Partner Violence:   . Fear of Current or Ex-Partner:   . Emotionally Abused:   . Physically Abused:   . Sexually Abused:    Current Meds  Medication Sig  . Acetaminophen (TYLENOL EXTRA STRENGTH PO) Take by mouth.  . albuterol (PROVENTIL) (2.5 MG/3ML) 0.083% nebulizer solution Take 3 mLs (2.5 mg total) by nebulization every 6  (six) hours as needed for wheezing or shortness of breath.  . budesonide-formoterol (SYMBICORT) 160-4.5 MCG/ACT inhaler Inhale 2 puffs into the lungs 2 (two) times daily. Rinse mouth  . carvedilol (COREG) 25 MG tablet Take 1 tablet (25 mg total) by mouth 2 (two) times daily with a meal.  . cetirizine (ZYRTEC) 10 MG tablet Take 1 tablet (10 mg total) by mouth daily.  . Cholecalciferol 1.25 MG (50000 UT) capsule Take 1 capsule (50,000 Units total) by mouth once a week.  . cyanocobalamin (,VITAMIN B-12,) 1000 MCG/ML injection 1 mL 1x per week x 4 weeks then every 30 days  . escitalopram (LEXAPRO) 20 MG tablet Take 1 tablet (20 mg total) by mouth daily.  . fluticasone (FLONASE) 50 MCG/ACT nasal spray Place 2 sprays into both nostrils daily.  . mirabegron ER (MYRBETRIQ) 25 MG TB24 tablet Take 1 tablet (25 mg total) by mouth daily.  . nicotine polacrilex (NICORETTE) 4 MG gum Take 1 each (4 mg total) by mouth as needed for smoking cessation. Chew 1 piece every 1-2 hours max 24 pieces per day x 1-6 weeks then week 7-9 every 2-4 hours and week 10-12 every 4-8 hours  . traZODone (DESYREL) 50 MG tablet Take 0.5-1 tablets (25-50 mg total) by mouth at bedtime as needed for sleep.  . [DISCONTINUED] albuterol (PROVENTIL) (2.5 MG/3ML) 0.083% nebulizer solution Take 3 mLs (2.5 mg total) by nebulization every 6 (six) hours as needed for wheezing or shortness of breath.  . [DISCONTINUED] budesonide-formoterol (SYMBICORT) 160-4.5 MCG/ACT inhaler Inhale 2 puffs into the lungs 2 (two) times daily.  . [DISCONTINUED] carvedilol (COREG) 25 MG tablet Take 1 tablet (25 mg total) by mouth 2 (two) times daily with a meal.  . [DISCONTINUED] cetirizine (ZYRTEC) 10 MG tablet Take 1 tablet (10 mg total) by mouth daily.  . [DISCONTINUED] fluticasone (FLONASE) 50 MCG/ACT nasal spray Place 2 sprays into both nostrils daily.  . [DISCONTINUED] vitamin B-12 (CYANOCOBALAMIN) 1000 MCG tablet Take 1,000 mcg by mouth daily.   Allergies    Allergen Reactions  . Meloxicam Nausea Only   Recent Results (from the past 2160 hour(s))  Surgical pathology     Status: None   Collection Time: 07/22/19 12:00 AM  Result Value Ref Range   SURGICAL PATHOLOGY      Surgical Pathology CASE: [id number] PATIENT: Carly   [name] Flow Pathology Report     Clinical history: Thrombocytopenia     DIAGNOSIS:  - No monoclonal B-cell or phenotypically aberrant T-cell population identified.  GATING AND PHENOTYPIC ANALYSIS:  Gated population: Flow cytometric immunophenotyping is performed using antibodies to the antigens listed in the table below. Electronic gates are placed around a cell cluster displaying light scatter properties corresponding to: lymphocytes  Abnormal Cells in gated population: N/A  Phenotype of Abnormal Cells: N/A                        Lymphoid Antigens       Myeloid Antigens Miscellaneous CD2  tested    CD10 tested    CD11b     ND   CD45 tested CD3  tested    CD19 tested    CD11c     ND   HLA-Dr    ND CD4  tested    CD20 tested    CD13 ND   CD34 tested CD5  tested    CD22 ND   CD14 ND   CD38 tested CD7  tested    CD79b     ND   CD15 ND   CD138     ND CD8  tested    CD103     ND   CD16 ND   TdT  ND  CD25 ND   CD200     tested    CD33 ND   CD123     ND TCRab     ND   sKappa    tested    CD64 ND   CD41 ND TCRgd     tested    sLambda   tested    CD117     ND   CD61 ND CD56 tested    cKappa    ND   MPO  ND   CD71 ND CD57 ND   cLambda   ND        [id number]      GROSS DESCRIPTION:  Reference Bone Marrow case [id number].    Final Diagnosis performed by Julia Manny, MD.   Electronically signed 07/28/2019 Technical and / or Professional components performed at [facility], [address] [place], [place] [address].   Protime-INR upon arrival     Status: None   Collection Time: 07/22/19  7:51 AM  Result Value Ref Range   Prothrombin Time 12.5 11.4 - 15.2 seconds   INR 0.9 0.8 -  1.2    Comment: (NOTE) INR goal varies based on device and disease states. Performed at [facility], [address] [place], [place] [address]   CBC with Differential/Platelet     Status: Abnormal   Collection Time: 07/22/19  7:51 AM  Result Value Ref Range   WBC 7.2 4.0 - 10.5 K/uL   RBC 4.70 3.87 - 5.11 MIL/uL   Hemoglobin 16.0 (H) 12.0 - 15.0 g/dL   HCT 47.0 (H) 36.0 - 46.0 %   MCV 100.0 80.0 - 100.0 fL   MCH 34.0 26.0 - 34.0 pg   MCHC 34.0 30.0 - 36.0 g/dL   RDW 12.4 11.5 - 15.5 %   Platelets 29 (LL) 150 - 400 K/uL    Comment: Immature Platelet Fraction may be clinically indicated, consider ordering this additional test [id number] THIS CRITICAL RESULT HAS VERIFIED AND BEEN CALLED TO ASHLEY ANDERSON BY MARISA CARVER ON 01 06 2021 AT 0832, AND HAS BEEN READ BACK.       nRBC 0.0 0.0 - 0.2 %   Neutrophils Relative % 69 %   Neutro Abs 5.1 1.7 - 7.7 K/uL   Lymphocytes Relative 18 %   Lymphs Abs 1.3 0.7 - 4.0 K/uL   Monocytes Relative 10 %   Monocytes Absolute 0.7 0.1 - 1.0 K/uL   Eosinophils Relative 2 %   Eosinophils Absolute 0.1 0.0 - 0.5 K/uL   Basophils Relative 1 %   Basophils Absolute 0.0 0.0 - 0.1 K/uL   Immature Granulocytes 0 %   Abs Immature Granulocytes 0.03 0.00 - 0.07 K/uL    Comment: Performed at [facility], [address] [place], [place] [address]  Surgical pathology     Status: None   Collection Time: 07/22/19  9:06 AM  Result Value Ref Range   SURGICAL PATHOLOGY      SURGICAL PATHOLOGY CASE: [id number] PATIENT: Carly [name] Bone Marrow Report     Clinical History: Thrombocytopenia     DIAGNOSIS:  BONE MARROW, ASPIRATE, CLOT, CORE: - Normocellular marrow with erythroid and mild megakaryocytic hyperplasia.  PERIPHERAL BLOOD: - Thrombocytopenia. - Mild erythrocytosis.  COMMENT:  There is a mild erythrocytosis and erythroid hyperplasia, likely related to the patient's smoking history. Megakaryocytes are mildly  increased with a few loose clusters but no significant atypia. The findings are favored to be reactive/regenerative in the setting of thrombocytopenia (peripheral destruction, ITP, etc), but correlation with FISH for MDS is still recommended.  MICROSCOPIC DESCRIPTION:  PERIPHERAL BLOOD SMEAR: There is a mild erythrocytosis the red cell morphology is largely unremarkable.  Leukocytes are present in normal numbers with unremarkable morphology.  Platelets are decreased in number with occasional large platelets  seen.  BONE MARROW ASPIRATE: Spicular and cellular. Erythroid precursors: Mild relative increase in numbers.  Mild nuclear cytoplasmic asynchrony. Granulocytic precursors: Mild relative decrease in numbers.  No significant dysplasia.  No increase in blasts. Megakaryocytes: Mild increase in numbers but morphologically unremarkable. Lymphocytes/plasma cells: No increase in lymphocytes or plasma cells.  TOUCH PREPARATIONS: Similar to aspirate smears.  CLOT AND BIOPSY: The core biopsy and clot section are normocellular for age (50 to 60%).  There is a mild erythroid hyperplasia.  Megakaryocytes are mildly increased but exhibit a spectrum of maturation.  There are few loose clusters.  There are scattered small benign-appearing lymphoid aggregates which are a mixture of B-cells (CD20) and T-cells (CD3, CD5). CD23 is largely negative.  IRON STAIN: Iron stains are performed on a bone marrow aspirate or touch imprint smear and section of clot. The controls stained appropri ately.       Storage Iron: Present      Ring Sideroblasts: Absent  ADDITIONAL DATA/TESTING: Cytogenetics, including FISH for MDS, was ordered and will be reported separately.  Flow cytometry ([id number]) is negative for a monoclonal B-cell population or phenotypically aberrant T-cell population.   CELL COUNT DATA:  Bone Marrow count performed on 500 cells shows: Blasts:   0%   Myeloid:  43% Promyelocytes: 3%    Erythroid:     41% Myelocytes:    14%  Lymphocytes:   11% Metamyelocytes:     1%   Plasma cells:  2% Bands:    2% Neutrophils:   19%  M:E ratio:     1.05 Eosinophils:   4% Basophils:     0% Monocytes:     3%  Lab Data: CBC performed on 07/22/19 shows: WBC: 7.2 k/uL  Neutrophils:   77% Hgb: 16 g/dL   Lymphocytes:   14% HCT: 47 %   Monocytes:     8% MCV: 100 fL    Eosinophils:   1% RDW: 12.4 %    Basophils:     0% PLT: 29 k/uL   GROSS DESCRIPTION:  A: Aspirate smear  B: Received in B-plus fixative are tissue fragments which measure 0.7 x 0.6 x 0.2 cm  in aggregate.  The specimen is submitted in toto.  C: Received in B-plus fixative is a 1.9 x 0.2 cm core of bone which is submitted in toto following decalcification.  (GRP 07/22/2019)   Final Diagnosis performed by Julia Manny, MD.   Electronically signed 07/28/2019 Technical and / or Professional components performed at [facility], [address] [place], [place] [address].  Immunohistochemistry Technical component (if applicable) was performed at [facility]. [address], [place], [place] [address].   IMMUNOHISTOCHEMISTRY DISCLAIMER (if applicable): Some of these immunohistochemical stains may have been developed and the performance characteristics determine by [facility]. Some may not have been cleared or approved by the U.S. Food and Drug Administration. The FDA has determined that such clearance or approval is not necessary. This test is used for clinical purposes. It should not be regarded as inve stigational or for research. This laboratory is certified under the Clinical Laboratory Improvement Amendments of 1988 (CLIA-88) as qualified to perform high complexity clinical laboratory testing.  The controls stained appropriately.   CBC with Differential     Status: Abnormal   Collection Time: 08/06/19  9:00 AM  Result Value Ref Range   WBC 7.9 4.0 - 10.5  K/uL   RBC 4.75 3.87 - 5.11 MIL/uL   Hemoglobin 16.0 (H) 12.0 - 15.0 g/dL   HCT 48.7 (H) 36.0 - 46.0 %   MCV 102.5 (H) 80.0 - 100.0 fL   MCH 33.7 26.0 - 34.0 pg   MCHC 32.9 30.0 - 36.0 g/dL   RDW 12.2 11.5 - 15.5 %   Platelets 58 (L) 150 - 400 K/uL   nRBC 0.0 0.0 - 0.2 %   Neutrophils Relative % 64 %   Neutro Abs 5.1 1.7 - 7.7 K/uL   Lymphocytes Relative 25 %   Lymphs Abs 2.0 0.7 - 4.0 K/uL   Monocytes Relative 8 %   Monocytes Absolute 0.7 0.1 - 1.0 K/uL   Eosinophils Relative 2 %   Eosinophils Absolute 0.2 0.0 - 0.5 K/uL   Basophils Relative 1 %   Basophils Absolute 0.1 0.0 - 0.1 K/uL   Immature Granulocytes 0 %   Abs Immature Granulocytes 0.01 0.00 - 0.07 K/uL    Comment: Performed at [facility], [address] [place], [place] [address]  CBC with Differential     Status: Abnormal   Collection Time: 09/17/19 11:31 AM  Result Value Ref Range   WBC 8.7 4.0 - 10.5 K/uL    Comment: WHITE COUNT CONFIRMED ON SMEAR   RBC 4.51 3.87 - 5.11 MIL/uL   Hemoglobin 15.0 12.0 - 15.0 g/dL   HCT 45.6 36.0 - 46.0 %   MCV 101.1 (H) 80.0 - 100.0 fL   MCH 33.3 26.0 - 34.0 pg   MCHC 32.9 30.0 - 36.0 g/dL   RDW 12.2 11.5 - 15.5 %   Platelets 52 (L) 150 - 400 K/uL    Comment: Immature Platelet Fraction may be clinically indicated, consider ordering this additional test [id number]    nRBC 0.0 0.0 - 0.2 %   Neutrophils Relative % 64 %   Neutro Abs   5.6 1.7 - 7.7 K/uL   Lymphocytes Relative 25 %   Lymphs Abs 2.2 0.7 - 4.0 K/uL   Monocytes Relative 10 %   Monocytes Absolute 0.9 0.1 - 1.0 K/uL   Eosinophils Relative 1 %   Eosinophils Absolute 0.1 0.0 - 0.5 K/uL   Basophils Relative 0 %   Basophils Absolute 0.0 0.0 - 0.1 K/uL   WBC Morphology UNREMARKABLE, DIFF CONFIRMED BY MANUAL    RBC Morphology UNREMARKABLE    Smear Review PLATELETS APPEAR DECREASED     Comment: PLATELETS VARY IN SIZE, LARGE BODY PLATELETS NOTED   Abs Immature Granulocytes 0.00 0.00 - 0.07 K/uL    Comment:  Performed at [facility], [address] [place], [place] [address]   Objective  Body mass index is 33.62 kg/m. Wt Readings from Last 3 Encounters:  09/29/19 189 lb 12.8 oz (86.1 kg)  08/06/19 191 lb 12.8 oz (87 kg)  07/22/19 200 lb (90.7 kg)   Temp Readings from Last 3 Encounters:  09/29/19 (!) 97.5 F (36.4 C) (Temporal)  07/22/19 98.5 F (36.9 C) (Oral)  05/08/19 (!) 97.5 F (36.4 C)   BP Readings from Last 3 Encounters:  09/29/19 128/80  08/06/19 (!) 158/92  07/22/19 139/87   Pulse Readings from Last 3 Encounters:  09/29/19 (!) 102  08/06/19 (!) 101  07/22/19 94    Physical Exam Vitals and nursing note reviewed.  Constitutional:      Appearance: Normal appearance. She is well-developed and well-groomed.  HENT:     Head: Normocephalic and atraumatic.  Eyes:     Conjunctiva/sclera: Conjunctivae normal.     Pupils: Pupils are equal, round, and reactive to light.  Cardiovascular:     Rate and Rhythm: Normal rate and regular rhythm.     Heart sounds: Normal heart sounds. No murmur.  Pulmonary:     Effort: Pulmonary effort is normal.     Breath sounds: Normal breath sounds.  Musculoskeletal:     Right foot: Tenderness present.     Left foot: Tenderness present.       Legs:     Comments: Heel pain b/l likely PF  Skin:    General: Skin is warm and dry.  Neurological:     General: No focal deficit present.     Mental Status: She is alert and oriented to person, place, and time. Mental status is at baseline.     Gait: Gait normal.  Psychiatric:        Attention and Perception: Attention and perception normal.        Mood and Affect: Mood normal.        Speech: Speech normal.        Behavior: Behavior normal. Behavior is cooperative.        Thought Content: Thought content normal.        Cognition and Memory: Cognition and memory normal.        Judgment: Judgment normal.     Assessment  Plan  Plantar fasciitis, bilateral - Plan: predniSONE  (DELTASONE) 20 MG tablet qd x 7 days, Ambulatory referral to podiatry, diclofenac Sodium (VOLTAREN) 1 % GEL   COPD exacerbation (HCC) - Plan: albuterol (PROVENTIL) (2.5 MG/3ML) 0.083% nebulizer solution, budesonide-formoterol (SYMBICORT) 160-4.5 MCG/ACT inhaler  Essential hypertension controlled cont meds- Plan: amLODipine (NORVASC) 10 MG tablet, carvedilol (COREG) 25 MG tablet, spironolactone (ALDACTONE) 25 MG tablet  Allergic rhinitis, unspecified seasonality, unspecified trigger - Plan: cetirizine (ZYRTEC) 10 MG tablet, fluticasone (FLONASE) 50 MCG/ACT nasal   spray  Verruca vulgaris - Plan: Ambulatory referral to Dermatology  Skin cancer screening - Plan: Ambulatory referral to Dermatology  Osteoarthritis of both knees, unspecified osteoarthritis type - Plan: Ambulatory referral to Orthopedic Surgery, diclofenac Sodium (VOLTAREN) 1 % GEL  Routine cervical smear - Plan: Ambulatory referral to Obstetrics / Gynecology Dr. WARd  Female bladder prolapse - Plan: Ambulatory referral to Obstetrics / Gynecology  Bilateral calf pain - Plan: US Venous Img Lower Bilateral r/o bakers cyst   HM Flu shot utd  tdap utd due in 2026  covid 19 vaccine consider in future   Pap overdue sch Dr. Smitty Knudsen change to Dr. WArd s/p ablation and cervical polyps  -she will call [facility] ob/gyn Dr. Leonides Schanz in [place] and get this scheduled due for pap   mammo 07/20/19    Colonoscopy consider in future [facility] female address low plts 1st   Skin referred [address]   Provider: Dr. Olivia Mackie McLean-Scocuzza-Internal Medicine

## 2019-09-29 NOTE — Patient Instructions (Signed)
Journal for Nurse Practitioners, 15(4), 263-267. Retrieved April 21, 2018 from [email or web address]">  Knee Exercises Ask your health care provider which exercises are safe for you. Do exercises exactly as told by your health care provider and adjust them as directed. It is normal to feel mild stretching, pulling, tightness, or discomfort as you do these exercises. Stop right away if you feel sudden pain or your pain gets worse. Do not begin these exercises until told by your health care provider. Stretching and range-of-motion exercises These exercises warm up your muscles and joints and improve the movement and flexibility of your knee. These exercises also help to relieve pain and swelling. Knee extension, prone 1. Lie on your abdomen (prone position) on a bed. 2. Place your left / right knee just beyond the edge of the surface so your knee is not on the bed. You can put a towel under your left / right thigh just above your kneecap for comfort. 3. Relax your leg muscles and allow gravity to straighten your knee (extension). You should feel a stretch behind your left / right knee. 4. Hold this position for __________ seconds. 5. Scoot up so your knee is supported between repetitions. Repeat __________ times. Complete this exercise __________ times a day. Knee flexion, active  1. Lie on your back with both legs straight. If this causes back discomfort, bend your left / right knee so your foot is flat on the floor. 2. Slowly slide your left / right heel back toward your buttocks. Stop when you feel a gentle stretch in the front of your knee or thigh (flexion). 3. Hold this position for __________ seconds. 4. Slowly slide your left / right heel back to the starting position. Repeat __________ times. Complete this exercise __________ times a day. Quadriceps stretch, prone  1. Lie on your abdomen on a firm surface, such as a bed or padded floor. 2. Bend your left / right knee and hold  your ankle. If you cannot reach your ankle or pant leg, loop a belt around your foot and grab the belt instead. 3. Gently pull your heel toward your buttocks. Your knee should not slide out to the side. You should feel a stretch in the front of your thigh and knee (quadriceps). 4. Hold this position for __________ seconds. Repeat __________ times. Complete this exercise __________ times a day. Hamstring, supine 1. Lie on your back (supine position). 2. Loop a belt or towel over the ball of your left / right foot. The ball of your foot is on the walking surface, right under your toes. 3. Straighten your left / right knee and slowly pull on the belt to raise your leg until you feel a gentle stretch behind your knee (hamstring). ? Do not let your knee bend while you do this. ? Keep your other leg flat on the floor. 4. Hold this position for __________ seconds. Repeat __________ times. Complete this exercise __________ times a day. Strengthening exercises These exercises build strength and endurance in your knee. Endurance is the ability to use your muscles for a long time, even after they get tired. Quadriceps, isometric This exercise stretches the muscles in front of your thigh (quadriceps) without moving your knee joint (isometric). 1. Lie on your back with your left / right leg extended and your other knee bent. Put a rolled towel or small pillow under your knee if told by your health care provider. 2. Slowly tense the muscles in the front of your left /   right thigh. You should see your kneecap slide up toward your hip or see increased dimpling just above the knee. This motion will push the back of the knee toward the floor. 3. For __________ seconds, hold the muscle as tight as you can without increasing your pain. 4. Relax the muscles slowly and completely. Repeat __________ times. Complete this exercise __________ times a day. Straight leg raises This exercise stretches the muscles in front  of your thigh (quadriceps) and the muscles that move your hips (hip flexors). 1. Lie on your back with your left / right leg extended and your other knee bent. 2. Tense the muscles in the front of your left / right thigh. You should see your kneecap slide up or see increased dimpling just above the knee. Your thigh may even shake a bit. 3. Keep these muscles tight as you raise your leg 4-6 inches (10-15 cm) off the floor. Do not let your knee bend. 4. Hold this position for __________ seconds. 5. Keep these muscles tense as you lower your leg. 6. Relax your muscles slowly and completely after each repetition. Repeat __________ times. Complete this exercise __________ times a day. Hamstring, isometric 1. Lie on your back on a firm surface. 2. Bend your left / right knee about __________ degrees. 3. Dig your left / right heel into the surface as if you are trying to pull it toward your buttocks. Tighten the muscles in the back of your thighs (hamstring) to "dig" as hard as you can without increasing any pain. 4. Hold this position for __________ seconds. 5. Release the tension gradually and allow your muscles to relax completely for __________ seconds after each repetition. Repeat __________ times. Complete this exercise __________ times a day. Hamstring curls If told by your health care provider, do this exercise while wearing ankle weights. Begin with __________ lb weights. Then increase the weight by 1 lb (0.5 kg) increments. Do not wear ankle weights that are more than __________ lb. 1. Lie on your abdomen with your legs straight. 2. Bend your left / right knee as far as you can without feeling pain. Keep your hips flat against the floor. 3. Hold this position for __________ seconds. 4. Slowly lower your leg to the starting position. Repeat __________ times. Complete this exercise __________ times a day. Squats This exercise strengthens the muscles in front of your thigh and knee  (quadriceps). 1. Stand in front of a table, with your feet and knees pointing straight ahead. You may rest your hands on the table for balance but not for support. 2. Slowly bend your knees and lower your hips like you are going to sit in a chair. ? Keep your weight over your heels, not over your toes. ? Keep your lower legs upright so they are parallel with the table legs. ? Do not let your hips go lower than your knees. ? Do not bend lower than told by your health care provider. ? If your knee pain increases, do not bend as low. 3. Hold the squat position for __________ seconds. 4. Slowly push with your legs to return to standing. Do not use your hands to pull yourself to standing. Repeat __________ times. Complete this exercise __________ times a day. Wall slides This exercise strengthens the muscles in front of your thigh and knee (quadriceps). 1. Lean your back against a smooth wall or door, and walk your feet out 18-24 inches (46-61 cm) from it. 2. Place your feet hip-width apart. 3.   Slowly slide down the wall or door until your knees bend __________ degrees. Keep your knees over your heels, not over your toes. Keep your knees in line with your hips. 4. Hold this position for __________ seconds. Repeat __________ times. Complete this exercise __________ times a day. Straight leg raises This exercise strengthens the muscles that rotate the leg at the hip and move it away from your body (hip abductors). 1. Lie on your side with your left / right leg in the top position. Lie so your head, shoulder, knee, and hip line up. You may bend your bottom knee to help you keep your balance. 2. Roll your hips slightly forward so your hips are stacked directly over each other and your left / right knee is facing forward. 3. Leading with your heel, lift your top leg 4-6 inches (10-15 cm). You should feel the muscles in your outer hip lifting. ? Do not let your foot drift forward. ? Do not let your knee  roll toward the ceiling. 4. Hold this position for __________ seconds. 5. Slowly return your leg to the starting position. 6. Let your muscles relax completely after each repetition. Repeat __________ times. Complete this exercise __________ times a day. Straight leg raises This exercise stretches the muscles that move your hips away from the front of the pelvis (hip extensors). 1. Lie on your abdomen on a firm surface. You can put a pillow under your hips if that is more comfortable. 2. Tense the muscles in your buttocks and lift your left / right leg about 4-6 inches (10-15 cm). Keep your knee straight as you lift your leg. 3. Hold this position for __________ seconds. 4. Slowly lower your leg to the starting position. 5. Let your leg relax completely after each repetition. Repeat __________ times. Complete this exercise __________ times a day. This information is not intended to replace advice given to you by your health care provider. Make sure you discuss any questions you have with your health care provider. Document Revised: 04/22/2018 Document Reviewed: 04/22/2018 Else[employer] Patient Education  [employer].  Plantar Fasciitis  Plantar fasciitis is a painful foot condition that affects the heel. It occurs when the band of tissue that connects the toes to the heel bone (plantar fascia) becomes irritated. This can happen as the result of exercising too much or doing other repetitive activities (overuse injury). The pain from plantar fasciitis can range from mild irritation to severe pain that makes it difficult to walk or move. The pain is usually worse in the morning after sleeping, or after sitting or lying down for a while. Pain may also be worse after long periods of walking or standing. What are the causes? This condition may be caused by:  Standing for long periods of time.  Wearing shoes that do not have good arch support.  Doing activities that put stress on joints  (high-impact activities), including running, aerobics, and ballet.  Being overweight.  An abnormal way of walking (gait).  Tight muscles in the back of your lower leg (calf).  High arches in your feet.  Starting a new athletic activity. What are the signs or symptoms? The main symptom of this condition is heel pain. Pain may:  Be worse with first steps after a time of rest, especially in the morning after sleeping or after you have been sitting or lying down for a while.  Be worse after long periods of standing still.  Decrease after 30-45 minutes of activity, such  as gentle walking. How is this diagnosed? This condition may be diagnosed based on your medical history and your symptoms. Your health care provider may ask questions about your activity level. Your health care provider will do a physical exam to check for:  A tender area on the bottom of your foot.  A high arch in your foot.  Pain when you move your foot.  Difficulty moving your foot. You may have imaging tests to confirm the diagnosis, such as:  X-rays.  Ultrasound.  MRI. How is this treated? Treatment for plantar fasciitis depends on how severe your condition is. Treatment may include:  Rest, ice, applying pressure (compression), and raising the affected foot (elevation). This may be called RICE therapy. Your health care provider may recommend RICE therapy along with over-the-counter pain medicines to manage your pain.  Exercises to stretch your calves and your plantar fascia.  A splint that holds your foot in a stretched, upward position while you sleep (night splint).  Physical therapy to relieve symptoms and prevent problems in the future.  Injections of steroid medicine (cortisone) to relieve pain and inflammation.  Stimulating your plantar fascia with electrical impulses (extracorporeal shock wave therapy). This is usually the last treatment option before surgery.  Surgery, if other treatments  have not worked after 12 months. Follow these instructions at home:  Managing pain, stiffness, and swelling  If directed, put ice on the painful area: ? Put ice in a plastic bag, or use a frozen bottle of water. ? Place a towel between your skin and the bag or bottle. ? Roll the bottom of your foot over the bag or bottle. ? Do this for 20 minutes, 2-3 times a day.  Wear athletic shoes that have air-sole or gel-sole cushions, or try wearing soft shoe inserts that are designed for plantar fasciitis.  Raise (elevate) your foot above the level of your heart while you are sitting or lying down. Activity  Avoid activities that cause pain. Ask your health care provider what activities are safe for you.  Do physical therapy exercises and stretches as told by your health care provider.  Try activities and forms of exercise that are easier on your joints (low-impact). Examples include swimming, water aerobics, and biking. General instructions  Take over-the-counter and prescription medicines only as told by your health care provider.  Wear a night splint while sleeping, if told by your health care provider. Loosen the splint if your toes tingle, become numb, or turn cold and blue.  Maintain a healthy weight, or work with your health care provider to lose weight as needed.  Keep all follow-up visits as told by your health care provider. This is important. Contact a health care provider if you:  Have symptoms that do not go away after caring for yourself at home.  Have pain that gets worse.  Have pain that affects your ability to move or do your daily activities. Summary  Plantar fasciitis is a painful foot condition that affects the heel. It occurs when the band of tissue that connects the toes to the heel bone (plantar fascia) becomes irritated.  The main symptom of this condition is heel pain that may be worse after exercising too much or standing still for a long time.  Treatment  varies, but it usually starts with rest, ice, compression, and elevation (RICE therapy) and over-the-counter medicines to manage pain. This information is not intended to replace advice given to you by your health care provider. Make sure  you discuss any questions you have with your health care provider. Document Revised: 06/14/2017 Document Reviewed: 04/29/2017 Elsevier Patient Education  2020 [employer].

## 2019-09-30 ENCOUNTER — Ambulatory Visit: Payer: BC Managed Care – PPO

## 2019-10-06 NOTE — Progress Notes (Signed)
This encounter was created in error - please disregard.

## 2019-10-07 DIAGNOSIS — M722 Plantar fascial fibromatosis: Secondary | ICD-10-CM | POA: Diagnosis not present

## 2019-10-13 ENCOUNTER — Ambulatory Visit: Payer: BC Managed Care – PPO | Admitting: Internal Medicine

## 2019-10-14 ENCOUNTER — Encounter: Payer: Self-pay | Admitting: Internal Medicine

## 2019-10-18 ENCOUNTER — Telehealth: Payer: BC Managed Care – PPO | Admitting: Family

## 2019-10-18 DIAGNOSIS — K591 Functional diarrhea: Secondary | ICD-10-CM | POA: Diagnosis not present

## 2019-10-18 NOTE — Progress Notes (Signed)
We are sorry that you are not feeling well.  Here is how we plan to help!  Based on what you have shared with me it looks like you have Acute Infectious Diarrhea.  Most cases of acute diarrhea are due to infections with virus and bacteria and are self-limited conditions lasting less than 14 days.  For your symptoms you may take Imodium 2 mg tablets that are over the counter at your local pharmacy. Take two tablet now and then one after each loose stool up to 6 a day.  Antibiotics are not needed for most people with diarrhea.    HOME CARE  We recommend changing your diet to help with your symptoms for the next few days.  Drink plenty of fluids that contain water salt and sugar. Sports drinks such as Gatorade may help.   You may try broths, soups, bananas, applesauce, soft breads, mashed potatoes or crackers.   You are considered infectious for as long as the diarrhea continues. Hand washing or use of alcohol based hand sanitizers is recommend.  It is best to stay out of work or school until your symptoms stop.   GET HELP RIGHT AWAY  If you have dark yellow colored urine or do not pass urine frequently you should drink more fluids.    If your symptoms worsen   If you feel like you are going to pass out (faint)  You have a new problem  MAKE SURE YOU   Understand these instructions.  Will watch your condition.  Will get help right away if you are not doing well or get worse.  Your e-visit answers were reviewed by a board certified advanced clinical practitioner to complete your personal care plan.  Depending on the condition, your plan could have included both over the counter or prescription medications.  If there is a problem please reply  once you have received a response from your provider.  Your safety is important to us.  If you have drug allergies check your prescription carefully.    You can use MyChart to ask questions about today's visit, request a non-urgent call  back, or ask for a work or school excuse for 24 hours related to this e-Visit. If it has been greater than 24 hours you will need to follow up with your provider, or enter a new e-Visit to address those concerns.   You will get an e-mail in the next two days asking about your experience.  I hope that your e-visit has been valuable and will speed your recovery. Thank you for using e-visits.   Greater than 5 minutes, yet less than 10 minutes of time have been spent researching, coordinating, and implementing care for this patient today.  Thank you for the details you included in the comment boxes. Those details are very helpful in determining the best course of treatment for you and help us to provide the best care.  

## 2019-10-22 ENCOUNTER — Other Ambulatory Visit: Payer: Self-pay

## 2019-10-22 ENCOUNTER — Encounter: Payer: Self-pay | Admitting: Internal Medicine

## 2019-10-22 ENCOUNTER — Ambulatory Visit (INDEPENDENT_AMBULATORY_CARE_PROVIDER_SITE_OTHER)
Admission: RE | Admit: 2019-10-22 | Discharge: 2019-10-22 | Disposition: A | Discharge status: Home / Self Care | Payer: BC Managed Care – PPO | Source: Ambulatory Visit

## 2019-10-22 DIAGNOSIS — J040 Acute laryngitis: Secondary | ICD-10-CM

## 2019-10-22 DIAGNOSIS — B9789 Other viral agents as the cause of diseases classified elsewhere: Secondary | ICD-10-CM | POA: Diagnosis not present

## 2019-10-22 MED ORDER — PREDNISONE 20 MG PO TABS: 20.0000 mg | ORAL_TABLET | Freq: Two times a day (BID) | ORAL | 0 refills | Status: AC

## 2019-10-22 NOTE — ED Provider Notes (Signed)
[place]    Virtual Visit via Video Note:  Carly [name]  initiated request for Telemedicine visit with [facility] team. I connected with Carly [name]  on 10/22/2019 at 2:24 PM  for a synchronized telemedicine visit using a video enabled HIPPA compliant telemedicine application. I verified that I am speaking with Carly [name]  using two identifiers. [place], PA-C  was physically located in a [facility] site and Carly [name] was located at a different location.   The limitations of evaluation and management by telemedicine as well as the availability of in-person appointments were discussed. Patient was informed that she  may incur a bill ( including co-pay) for this virtual visit encounter. Carly [name]  expressed understanding and gave verbal consent to proceed with virtual visit.  TO:[id number] 10/22/19 Arrival Time: 1413  Cc: Hoarseness  SUBJECTIVE:  Carly [name] is a 49 y.o. female who presents with hoarseness x 1 day.  Denies positive sick exposure or precipitating event.  Has tried allergy pill, flonase, and ibuprofen without relief.  Symptoms are made worse with talking.  Reports previous symptoms in the past.   Complains of sinus pain, dry cough (this is normal for patient she has COPD and asthma), and strained sore throat.  Denies fever, chills, fatigue, sinus pain, rhinorrhea, SOB, wheezing, chest pain, nausea, vomiting, changes in bowel or bladder habits.    ROS: As per HPI.  All other pertinent ROS negative.     Past Medical History:  Diagnosis Date  . Anxiety   . Chicken pox   . Depression   . Elevated testosterone level in female   . Emphysema of lung (HCC)    bronchitis  . Frequent headaches   . Hay fever   . Hypertension    Past Surgical History:  Procedure Laterality Date  . HYSTEROSCOPY WITH NOVASURE N/A 02/21/2018   Procedure: HYSTEROSCOPY WITH NOVASURE, POLYPECTOMY;  Surgeon: Benjaman Kindler, MD;  Location: [facility]  ORS;  Service: Gynecology;  Laterality: N/A;  . TUBAL LIGATION  1997  . TUBAL LIGATION     Allergies  Allergen Reactions  . Meloxicam Nausea Only   No current facility-administered medications on file prior to encounter.   Current Outpatient Medications on File Prior to Encounter  Medication Sig Dispense Refill  . Acetaminophen (TYLENOL EXTRA STRENGTH PO) Take by mouth.    Marland Kitchen albuterol (PROVENTIL) (2.5 MG/3ML) 0.083% nebulizer solution Take 3 mLs (2.5 mg total) by nebulization every 6 (six) hours as needed for wheezing or shortness of breath. 360 mL 11  . amLODipine (NORVASC) 10 MG tablet Take 1 tablet (10 mg total) by mouth daily. 90 tablet 3  . budesonide-formoterol (SYMBICORT) 160-4.5 MCG/ACT inhaler Inhale 2 puffs into the lungs 2 (two) times daily. Rinse mouth 1 Inhaler 11  . carvedilol (COREG) 25 MG tablet Take 1 tablet (25 mg total) by mouth 2 (two) times daily with a meal. 180 tablet 3  . cetirizine (ZYRTEC) 10 MG tablet Take 1 tablet (10 mg total) by mouth daily. 90 tablet 3  . Cholecalciferol 1.25 MG (50000 UT) capsule Take 1 capsule (50,000 Units total) by mouth once a week. 13 capsule 1  . cyanocobalamin (,VITAMIN B-12,) 1000 MCG/ML injection 1 mL 1x per week x 4 weeks then every 30 days 8 mL 3  . diclofenac Sodium (VOLTAREN) 1 % GEL Apply 2-4 g topically 4 (four) times daily. 2 grams upper body qid prn and 4 gram  lower body qid prn 150 g 11  . escitalopram (LEXAPRO) 20 MG tablet Take 1 tablet (20 mg total) by mouth daily. 90 tablet 3  . fluticasone (FLONASE) 50 MCG/ACT nasal spray Place 2 sprays into both nostrils daily. 16 g 11  . mirabegron ER (MYRBETRIQ) 25 MG TB24 tablet Take 1 tablet (25 mg total) by mouth daily. 90 tablet 3  . nicotine polacrilex (NICORETTE) 4 MG gum Take 1 each (4 mg total) by mouth as needed for smoking cessation. Chew 1 piece every 1-2 hours max 24 pieces per day x 1-6 weeks then week 7-9 every 2-4 hours and week 10-12 every 4-8 hours 300 tablet 5  .  OVER THE COUNTER MEDICATION Take 2 drops by mouth at bedtime. CBD Oil - takes for sleep    . spironolactone (ALDACTONE) 25 MG tablet Take 1 tablet (25 mg total) by mouth daily. In am 90 tablet 3  . Syringe/Needle, Disp, 25G X 1-1/2" 1 ML MISC 1 Device by Does not apply route as directed. (Patient not taking: Reported on 09/29/2019) 30 each 11  . traZODone (DESYREL) 50 MG tablet Take 0.5-1 tablets (25-50 mg total) by mouth at bedtime as needed for sleep. 90 tablet 3    Social History   Socioeconomic History  . Marital status: Married    Spouse name: Not on file  . Number of children: Not on file  . Years of education: Not on file  . Highest education level: Not on file  Occupational History  . Not on file  Tobacco Use  . Smoking status: Current Every Day Smoker    Packs/day: 0.50    Years: 28.00    Pack years: 14.00    Types: Cigarettes  . Smokeless tobacco: Never Used  Substance and Sexual Activity  . Alcohol use: Yes    Alcohol/week: 8.0 standard drinks    Types: 8 Standard drinks or equivalent per week    Comment: Beer (Can)   . Drug use: No    Comment: CBD oil  . Sexual activity: Yes    Partners: Male  Other Topics Concern  . Not on file  Social History Narrative   1 Year college    Works in a [employer]   Married to [place]    Enjoys coloring    Pets: Dog lives inside   Children: 53 Son 9 Son   Social Determinants of Radio broadcast assistant Strain:   . Difficulty of Paying Living Expenses:   Food Insecurity:   . Worried About Charity fundraiser in the Last Year:   . Arboriculturist in the Last Year:   Transportation Needs:   . Film/video editor (Medical):   Marland Kitchen Lack of Transportation (Non-Medical):   Physical Activity:   . Days of Exercise per Week:   . Minutes of Exercise per Session:   Stress:   . Feeling of Stress :   Social Connections:   . Frequency of Communication with Friends and Family:   . Frequency of Social  Gatherings with Friends and Family:   . Attends Religious Services:   . Active Member of Clubs or Organizations:   . Attends Archivist Meetings:   Marland Kitchen Marital Status:   Intimate Partner Violence:   . Fear of Current or Ex-Partner:   . Emotionally Abused:   Marland Kitchen Physically Abused:   . Sexually Abused:    Family History  Problem Relation Age of Onset  .  Hyperlipidemia Mother   . Heart disease Mother   . Stroke Mother   . Depression Mother   . Mental illness Mother   . Heart attack Mother 53  . Hyperlipidemia Father   . Depression Father   . Mental illness Father   . Diabetes Paternal Grandfather   . Cancer Cousin     OBJECTIVE:  There were no vitals filed for this visit.   General appearance: alert; no distress Eyes: EOMI grossly HENT: normocephalic; atraumatic; voice hoarse Neck: supple with FROM Lungs: normal respiratory effort; speaking in full sentences without difficulty Extremities: moves extremities without difficulty Skin: No obvious rashes Neurologic: No facial asymmetries Psychological: alert and cooperative; normal mood and affect  ASSESSMENT & PLAN:  1. Viral laryngitis     Meds ordered this encounter  Medications  . predniSONE (DELTASONE) 20 MG tablet    Sig: Take 1 tablet (20 mg total) by mouth 2 (two) times daily with a meal for 5 days.    Dispense:  10 tablet    Refill:  0    Order Specific Question:   Supervising Provider    Answer:   Raylene Everts [id number]   Symptoms consistent with viral laryngitis.  Symptoms usually resolve within 7-10 days Get plenty of rest and push fluids Continue with allergy medication, flonase, and ibuprofen We will try a course of prednisone  Follow up in person or with PCP if symptoms persists Follow up in person or go to the ED if you have any new or worsening symptoms such as fever, cough, shortness of breath, chest tightness, chest pain, turning blue, changes in mental status, etc...   I discussed  the assessment and treatment plan with the patient. The patient was provided an opportunity to ask questions and all were answered. The patient agreed with the plan and demonstrated an understanding of the instructions.   The patient was advised to call back or seek an in-person evaluation if the symptoms worsen or if the condition fails to improve as anticipated.  I provided 5 minutes of non-face-to-face time during this encounter.  [place], PA-C  10/22/2019 2:24 PM           Lestine Box, PA-C 10/22/19 1424

## 2019-10-22 NOTE — Discharge Instructions (Signed)
Symptoms consistent with viral laryngitis.  Symptoms usually resolve within 7-10 days Get plenty of rest and push fluids Continue with allergy medication, flonase, and ibuprofen We will try a course of prednisone  Follow up in person or with PCP if symptoms persists Follow up in person or go to the ED if you have any new or worsening symptoms such as fever, cough, shortness of breath, chest tightness, chest pain, turning blue, changes in mental status, etc..Marland Kitchen

## 2019-10-28 DIAGNOSIS — M722 Plantar fascial fibromatosis: Secondary | ICD-10-CM | POA: Diagnosis not present

## 2019-10-30 NOTE — Progress Notes (Signed)
[place]  [phone] [phone]  ID: Lin Landsman OB: 1971-03-22  MR#: [id number]  FKC#:[id number]  Patient Care Team: McLean-Scocuzza, Nino Glow, MD as PCP - General (Internal Medicine)  I connected with Lin Landsman on 11/03/19 at  1:15 PM EDT by video enabled telemedicine visit and verified that I am speaking with the correct person using two identifiers.   I discussed the limitations, risks, security and privacy concerns of performing an evaluation and management service by telemedicine and the availability of in-person appointments. I also discussed with the patient that there may be a patient responsible charge related to this service. The patient expressed understanding and agreed to proceed.   Other persons participating in the visit and their role in the encounter: Patient, MD.  Patient's location: Home. Provider's location: Clinic.  CHIEF COMPLAINT: ITP.  INTERVAL HISTORY: Patient agreed to video assisted telemedicine visit for further evaluation and discussion of her laboratory results.  She has noticed increased bruising, but denies any bleeding.  She otherwise feels well.  She has no neurologic complaints.  She denies any recent fevers or illnesses.  She has a good appetite and denies weight loss.  She denies any chest pain, shortness of breath, cough, or hemoptysis.  She denies any nausea, vomiting, constipation, or diarrhea.  She has no urinary complaints.  Patient offers no further specific complaints today.  REVIEW OF SYSTEMS:   Review of Systems  Constitutional: Negative.  Negative for fever, malaise/fatigue and weight loss.  Respiratory: Negative.  Negative for cough, hemoptysis and shortness of breath.   Cardiovascular: Negative.  Negative for chest pain and leg swelling.  Gastrointestinal: Negative.  Negative for abdominal pain, blood in stool and melena.  Genitourinary: Negative.  Negative for hematuria.  Musculoskeletal:  Negative.  Negative for back pain.  Skin: Negative.  Negative for rash.  Neurological: Negative.  Negative for dizziness, focal weakness, weakness and headaches.  Endo/Heme/Allergies: Bruises/bleeds easily.  Psychiatric/Behavioral: Negative.  The patient is not nervous/anxious.     As per HPI. Otherwise, a complete review of systems is negative.  PAST MEDICAL HISTORY: Past Medical History:  Diagnosis Date  . Anxiety   . Chicken pox   . Depression   . Elevated testosterone level in female   . Emphysema of lung (HCC)    bronchitis  . Frequent headaches   . Hay fever   . Hypertension     PAST SURGICAL HISTORY: Past Surgical History:  Procedure Laterality Date  . HYSTEROSCOPY WITH NOVASURE N/A 02/21/2018   Procedure: HYSTEROSCOPY WITH NOVASURE, POLYPECTOMY;  Surgeon: Benjaman Kindler, MD;  Location: [facility] ORS;  Service: Gynecology;  Laterality: N/A;  . TUBAL LIGATION  1997  . TUBAL LIGATION      FAMILY HISTORY: Family History  Problem Relation Age of Onset  . Hyperlipidemia Mother   . Heart disease Mother   . Stroke Mother   . Depression Mother   . Mental illness Mother   . Heart attack Mother 110  . Hyperlipidemia Father   . Depression Father   . Mental illness Father   . Diabetes Paternal Grandfather   . Cancer Cousin     ADVANCED DIRECTIVES (Y/N):  N  HEALTH MAINTENANCE: Social History   Tobacco Use  . Smoking status: Current Every Day Smoker    Packs/day: 0.50    Years: 28.00    Pack years: 14.00    Types: Cigarettes  . Smokeless tobacco: Never Used  Substance Use Topics  . Alcohol  use: Yes    Alcohol/week: 8.0 standard drinks    Types: 8 Standard drinks or equivalent per week    Comment: Beer (Can)   . Drug use: No    Comment: CBD oil     Colonoscopy:  PAP:  Bone density:  Lipid panel:  Allergies  Allergen Reactions  . Meloxicam Nausea Only    Current Outpatient Medications  Medication Sig Dispense Refill  . Acetaminophen (TYLENOL EXTRA  STRENGTH PO) Take by mouth.    Marland Kitchen albuterol (PROVENTIL) (2.5 MG/3ML) 0.083% nebulizer solution Take 3 mLs (2.5 mg total) by nebulization every 6 (six) hours as needed for wheezing or shortness of breath. 360 mL 11  . amLODipine (NORVASC) 10 MG tablet Take 1 tablet (10 mg total) by mouth daily. 90 tablet 3  . budesonide-formoterol (SYMBICORT) 160-4.5 MCG/ACT inhaler Inhale 2 puffs into the lungs 2 (two) times daily. Rinse mouth 1 Inhaler 11  . carvedilol (COREG) 25 MG tablet Take 1 tablet (25 mg total) by mouth 2 (two) times daily with a meal. 180 tablet 3  . cetirizine (ZYRTEC) 10 MG tablet Take 1 tablet (10 mg total) by mouth daily. 90 tablet 3  . Cholecalciferol 1.25 MG (50000 UT) capsule Take 1 capsule (50,000 Units total) by mouth once a week. 13 capsule 1  . cyanocobalamin (,VITAMIN B-12,) 1000 MCG/ML injection 1 mL 1x per week x 4 weeks then every 30 days 8 mL 3  . diclofenac Sodium (VOLTAREN) 1 % GEL Apply 2-4 g topically 4 (four) times daily. 2 grams upper body qid prn and 4 gram lower body qid prn 150 g 11  . escitalopram (LEXAPRO) 20 MG tablet Take 1 tablet (20 mg total) by mouth daily. 90 tablet 3  . fluticasone (FLONASE) 50 MCG/ACT nasal spray Place 2 sprays into both nostrils daily. 16 g 11  . mirabegron ER (MYRBETRIQ) 25 MG TB24 tablet Take 1 tablet (25 mg total) by mouth daily. 90 tablet 3  . nicotine polacrilex (NICORETTE) 4 MG gum Take 1 each (4 mg total) by mouth as needed for smoking cessation. Chew 1 piece every 1-2 hours max 24 pieces per day x 1-6 weeks then week 7-9 every 2-4 hours and week 10-12 every 4-8 hours 300 tablet 5  . OVER THE COUNTER MEDICATION Take 2 drops by mouth at bedtime. CBD Oil - takes for sleep    . spironolactone (ALDACTONE) 25 MG tablet Take 1 tablet (25 mg total) by mouth daily. In am 90 tablet 3  . Syringe/Needle, Disp, 25G X 1-1/2" 1 ML MISC 1 Device by Does not apply route as directed. (Patient not taking: Reported on 09/29/2019) 30 each 11  . traZODone  (DESYREL) 50 MG tablet Take 0.5-1 tablets (25-50 mg total) by mouth at bedtime as needed for sleep. 90 tablet 3   No current facility-administered medications for this visit.    OBJECTIVE: Vitals:     There is no height or weight on file to calculate BMI.    ECOG FS:0 - Asymptomatic  General: Well-developed, well-nourished, no acute distress. HEENT: Normocephalic. Neuro: Alert, answering all questions appropriately. Cranial nerves grossly intact. Psych: Normal affect.  LAB RESULTS:  Lab Results  Component Value Date   NA 141 06/10/2019   K 3.6 06/10/2019   CL 104 06/10/2019   CO2 27 06/10/2019   GLUCOSE 95 06/10/2019   BUN 4 (L) 06/10/2019   CREATININE 0.60 06/10/2019   CALCIUM 8.9 06/10/2019   PROT 6.4 06/10/2019   ALBUMIN 3.8  06/10/2019   AST 34 06/10/2019   ALT 21 06/10/2019   ALKPHOS 73 06/10/2019   BILITOT 0.7 06/10/2019   GFRNONAA >60 12/08/2018   GFRAA >60 12/08/2018    Lab Results  Component Value Date   WBC 9.3 11/02/2019   NEUTROABS 6.2 11/02/2019   HGB 15.1 (H) 11/02/2019   HCT 44.5 11/02/2019   MCV 100.2 (H) 11/02/2019   PLT 40 (L) 11/02/2019     STUDIES: No results found.  ASSESSMENT: ITP  PLAN:    1.  ITP: Confirmed with normal bone marrow biopsy and normal FISH and cytogenetics on July 22, 2019.  Patient has a positive ANA which is likely clinically insignificant.  The remainder of her laboratory work is either negative or within normal limits. CT scan on March 04, 2017 did not reveal splenomegaly.  Patient's platelet count has trended down to 40 and she reports increased bruising as well.  Will give a burst dose of prednisone of 80 mg daily for 7 days.  Patient return to clinic in 1 week with repeat laboratory work and further evaluation.  If prednisone dosing is not durable, can consider weekly Rituxan x4 if needed.   2.  Positive ANA: Likely clinically insignificant.  Referral was previously sent to rheumatology.  I provided 20 minutes of  face-to-face video visit time during this encounter which included chart review, counseling, and coordination of care as documented above.   Patient expressed understanding and was in agreement with this plan. She also understands that She can call clinic at any time with any questions, concerns, or complaints.    Lloyd Huger, MD   11/03/2019 3:21 PM

## 2019-11-02 ENCOUNTER — Inpatient Hospital Stay: Payer: BC Managed Care – PPO | Attending: Oncology

## 2019-11-02 ENCOUNTER — Other Ambulatory Visit: Payer: Self-pay

## 2019-11-02 DIAGNOSIS — D696 Thrombocytopenia, unspecified: Secondary | ICD-10-CM | POA: Diagnosis not present

## 2019-11-02 LAB — CBC WITH DIFFERENTIAL/PLATELET
Abs Immature Granulocytes: 0.04 10*3/uL (ref 0.00–0.07)
Basophils Absolute: 0.1 10*3/uL (ref 0.0–0.1)
Basophils Relative: 1 %
Eosinophils Absolute: 0.1 10*3/uL (ref 0.0–0.5)
Eosinophils Relative: 1 %
HCT: 44.5 % (ref 36.0–46.0)
Hemoglobin: 15.1 g/dL — ABNORMAL HIGH (ref 12.0–15.0)
Immature Granulocytes: 0 %
Lymphocytes Relative: 22 %
Lymphs Abs: 2.1 10*3/uL (ref 0.7–4.0)
MCH: 34 pg (ref 26.0–34.0)
MCHC: 33.9 g/dL (ref 30.0–36.0)
MCV: 100.2 fL — ABNORMAL HIGH (ref 80.0–100.0)
Monocytes Absolute: 0.9 10*3/uL (ref 0.1–1.0)
Monocytes Relative: 9 %
Neutro Abs: 6.2 10*3/uL (ref 1.7–7.7)
Neutrophils Relative %: 67 %
Platelets: 40 10*3/uL — ABNORMAL LOW (ref 150–400)
RBC: 4.44 MIL/uL (ref 3.87–5.11)
RDW: 13.2 % (ref 11.5–15.5)
WBC: 9.3 10*3/uL (ref 4.0–10.5)
nRBC: 0 % (ref 0.0–0.2)

## 2019-11-03 ENCOUNTER — Encounter (INDEPENDENT_AMBULATORY_CARE_PROVIDER_SITE_OTHER): Payer: BC Managed Care – PPO | Admitting: Internal Medicine

## 2019-11-03 ENCOUNTER — Inpatient Hospital Stay (HOSPITAL_BASED_OUTPATIENT_CLINIC_OR_DEPARTMENT_OTHER): Payer: BC Managed Care – PPO | Admitting: Oncology

## 2019-11-03 ENCOUNTER — Encounter: Payer: Self-pay | Admitting: Oncology

## 2019-11-03 DIAGNOSIS — F329 Major depressive disorder, single episode, unspecified: Secondary | ICD-10-CM | POA: Diagnosis not present

## 2019-11-03 DIAGNOSIS — F419 Anxiety disorder, unspecified: Secondary | ICD-10-CM

## 2019-11-03 DIAGNOSIS — D696 Thrombocytopenia, unspecified: Secondary | ICD-10-CM | POA: Diagnosis not present

## 2019-11-03 DIAGNOSIS — F32A Depression, unspecified: Secondary | ICD-10-CM

## 2019-11-03 MED ORDER — PREDNISONE 20 MG PO TABS: 80.0000 mg | ORAL_TABLET | Freq: Every day | ORAL | Status: DC

## 2019-11-03 NOTE — Progress Notes (Signed)
Pt reports increase in bruising, states that she was recently on 7 days of steroids for another reason, and still has had worsening bruising. No other concerns.

## 2019-11-04 ENCOUNTER — Telehealth: Payer: Self-pay | Admitting: Emergency Medicine

## 2019-11-04 MED ORDER — PREDNISONE 20 MG PO TABS: 80.0000 mg | ORAL_TABLET | Freq: Every day | ORAL | 0 refills | Status: DC

## 2019-11-04 NOTE — Addendum Note (Signed)
Addended by: Mila Merry on: 11/04/2019 09:35 AM   Modules accepted: Orders

## 2019-11-04 NOTE — Telephone Encounter (Signed)
Patient called to check to see if we had called in steroids to her pharmacy, stated that Dr. Grayland Ormond told her that he was going to. Spoke with Dr. Grayland Ormond, as I did not see steroids on her med list. Rx for prednisone called in to pharmacy per verbal order from Dr. Grayland Ormond, pt notified of same.

## 2019-11-05 ENCOUNTER — Other Ambulatory Visit: Payer: Self-pay | Admitting: Internal Medicine

## 2019-11-05 DIAGNOSIS — F329 Major depressive disorder, single episode, unspecified: Secondary | ICD-10-CM

## 2019-11-05 DIAGNOSIS — F32A Depression, unspecified: Secondary | ICD-10-CM

## 2019-11-05 MED ORDER — ALPRAZOLAM 0.25 MG PO TABS: 0.2500 mg | ORAL_TABLET | Freq: Every day | ORAL | 0 refills | Status: DC | PRN

## 2019-11-05 NOTE — Telephone Encounter (Signed)
Anxiety and depression worse on lexapro 20  Stressors family issues  Add xanax 0.25 mg qd prn cont lexapro 20 mg qd  Refer therapy [place]  Pt agreeable to my chart fee  5-10 min  [place]

## 2019-11-06 ENCOUNTER — Other Ambulatory Visit: Payer: Self-pay | Admitting: Emergency Medicine

## 2019-11-06 DIAGNOSIS — D696 Thrombocytopenia, unspecified: Secondary | ICD-10-CM

## 2019-11-07 NOTE — Progress Notes (Signed)
[place]  [phone] [phone]) [phone]  ID: Lin Landsman OB: April 16, 1971  MR#: [id number]  QZE#:[id number]  Patient Care Team: McLean-Scocuzza, Nino Glow, MD as PCP - General (Internal Medicine)  I connected with Lin Landsman on 11/11/19 at  2:30 PM EDT by video enabled telemedicine visit and verified that I am speaking with the correct person using two identifiers.   I discussed the limitations, risks, security and privacy concerns of performing an evaluation and management service by telemedicine and the availability of in-person appointments. I also discussed with the patient that there may be a patient responsible charge related to this service. The patient expressed understanding and agreed to proceed.   Other persons participating in the visit and their role in the encounter: Patient, MD.  Patient's location: Home. Provider's location: Clinic.  CHIEF COMPLAINT: ITP.  INTERVAL HISTORY: Patient agreed to video assisted telemedicine visit for further evaluation, discussion of her laboratory work, and treatment planning.  She did noted no appreciable change in her easy bruising after taking 1 week of prednisone.  She otherwise feels well.  She has no neurologic complaints.  She denies any recent fevers or illnesses.  She has a good appetite and denies weight loss.  She denies any chest pain, shortness of breath, cough, or hemoptysis.  She denies any nausea, vomiting, constipation, or diarrhea.  She has no urinary complaints.  Patient offers no further specific complaints today.  REVIEW OF SYSTEMS:   Review of Systems  Constitutional: Negative.  Negative for fever, malaise/fatigue and weight loss.  Respiratory: Negative.  Negative for cough, hemoptysis and shortness of breath.   Cardiovascular: Negative.  Negative for chest pain and leg swelling.  Gastrointestinal: Negative.  Negative for abdominal pain, blood in stool and melena.  Genitourinary:  Negative.  Negative for hematuria.  Musculoskeletal: Negative.  Negative for back pain.  Skin: Negative.  Negative for rash.  Neurological: Negative.  Negative for dizziness, focal weakness, weakness and headaches.  Endo/Heme/Allergies: Bruises/bleeds easily.  Psychiatric/Behavioral: Negative.  The patient is not nervous/anxious.     As per HPI. Otherwise, a complete review of systems is negative.  PAST MEDICAL HISTORY: Past Medical History:  Diagnosis Date  . Anxiety   . Chicken pox   . Depression   . Elevated testosterone level in female   . Emphysema of lung (HCC)    bronchitis  . Frequent headaches   . Hay fever   . Hypertension     PAST SURGICAL HISTORY: Past Surgical History:  Procedure Laterality Date  . HYSTEROSCOPY WITH NOVASURE N/A 02/21/2018   Procedure: HYSTEROSCOPY WITH NOVASURE, POLYPECTOMY;  Surgeon: Benjaman Kindler, MD;  Location: [facility] ORS;  Service: Gynecology;  Laterality: N/A;  . TUBAL LIGATION  1997  . TUBAL LIGATION      FAMILY HISTORY: Family History  Problem Relation Age of Onset  . Hyperlipidemia Mother   . Heart disease Mother   . Stroke Mother   . Depression Mother   . Mental illness Mother   . Heart attack Mother 31  . Hyperlipidemia Father   . Depression Father   . Mental illness Father   . Diabetes Paternal Grandfather   . Cancer Cousin     ADVANCED DIRECTIVES (Y/N):  N  HEALTH MAINTENANCE: Social History   Tobacco Use  . Smoking status: Current Every Day Smoker    Packs/day: 0.50    Years: 28.00    Pack years: 14.00    Types: Cigarettes  . Smokeless tobacco:  Never Used  Substance Use Topics  . Alcohol use: Yes    Alcohol/week: 8.0 standard drinks    Types: 8 Standard drinks or equivalent per week    Comment: Beer (Can)   . Drug use: No    Comment: CBD oil     Colonoscopy:  PAP:  Bone density:  Lipid panel:  Allergies  Allergen Reactions  . Meloxicam Nausea Only    Current Outpatient Medications  Medication  Sig Dispense Refill  . Acetaminophen (TYLENOL EXTRA STRENGTH PO) Take by mouth.    Marland Kitchen albuterol (PROVENTIL) (2.5 MG/3ML) 0.083% nebulizer solution Take 3 mLs (2.5 mg total) by nebulization every 6 (six) hours as needed for wheezing or shortness of breath. 360 mL 11  . ALPRAZolam (XANAX) 0.25 MG tablet Take 1 tablet (0.25 mg total) by mouth daily as needed for anxiety. 30 tablet 0  . amLODipine (NORVASC) 10 MG tablet Take 1 tablet (10 mg total) by mouth daily. 90 tablet 3  . budesonide-formoterol (SYMBICORT) 160-4.5 MCG/ACT inhaler Inhale 2 puffs into the lungs 2 (two) times daily. Rinse mouth 1 Inhaler 11  . carvedilol (COREG) 25 MG tablet Take 1 tablet (25 mg total) by mouth 2 (two) times daily with a meal. 180 tablet 3  . cetirizine (ZYRTEC) 10 MG tablet Take 1 tablet (10 mg total) by mouth daily. 90 tablet 3  . Cholecalciferol 1.25 MG (50000 UT) capsule Take 1 capsule (50,000 Units total) by mouth once a week. 13 capsule 1  . cyanocobalamin (,VITAMIN B-12,) 1000 MCG/ML injection 1 mL 1x per week x 4 weeks then every 30 days 8 mL 3  . diclofenac Sodium (VOLTAREN) 1 % GEL Apply 2-4 g topically 4 (four) times daily. 2 grams upper body qid prn and 4 gram lower body qid prn 150 g 11  . escitalopram (LEXAPRO) 20 MG tablet Take 1 tablet (20 mg total) by mouth daily. 90 tablet 3  . fluticasone (FLONASE) 50 MCG/ACT nasal spray Place 2 sprays into both nostrils daily. 16 g 11  . mirabegron ER (MYRBETRIQ) 25 MG TB24 tablet Take 1 tablet (25 mg total) by mouth daily. 90 tablet 3  . nicotine polacrilex (NICORETTE) 4 MG gum Take 1 each (4 mg total) by mouth as needed for smoking cessation. Chew 1 piece every 1-2 hours max 24 pieces per day x 1-6 weeks then week 7-9 every 2-4 hours and week 10-12 every 4-8 hours 300 tablet 5  . OVER THE COUNTER MEDICATION Take 2 drops by mouth at bedtime. CBD Oil - takes for sleep    . predniSONE (DELTASONE) 20 MG tablet Take 4 tablets (80 mg total) by mouth daily with breakfast.  28 tablet 0  . spironolactone (ALDACTONE) 25 MG tablet Take 1 tablet (25 mg total) by mouth daily. In am 90 tablet 3  . Syringe/Needle, Disp, 25G X 1-1/2" 1 ML MISC 1 Device by Does not apply route as directed. (Patient not taking: Reported on 09/29/2019) 30 each 11  . traZODone (DESYREL) 50 MG tablet Take 0.5-1 tablets (25-50 mg total) by mouth at bedtime as needed for sleep. 90 tablet 3   No current facility-administered medications for this visit.    OBJECTIVE: Vitals:     There is no height or weight on file to calculate BMI.    ECOG FS:0 - Asymptomatic  General: Well-developed, well-nourished, no acute distress. HEENT: Normocephalic. Neuro: Alert, answering all questions appropriately. Cranial nerves grossly intact. Psych: Normal affect.  LAB RESULTS:  Lab Results  Component Value Date   NA 141 06/10/2019   K 3.6 06/10/2019   CL 104 06/10/2019   CO2 27 06/10/2019   GLUCOSE 95 06/10/2019   BUN 4 (L) 06/10/2019   CREATININE 0.60 06/10/2019   CALCIUM 8.9 06/10/2019   PROT 6.4 06/10/2019   ALBUMIN 3.8 06/10/2019   AST 34 06/10/2019   ALT 21 06/10/2019   ALKPHOS 73 06/10/2019   BILITOT 0.7 06/10/2019   GFRNONAA >60 12/08/2018   GFRAA >60 12/08/2018    Lab Results  Component Value Date   WBC 11.9 (H) 11/09/2019   NEUTROABS 10.5 (H) 11/09/2019   HGB 16.4 (H) 11/09/2019   HCT 48.4 (H) 11/09/2019   MCV 100.2 (H) 11/09/2019   PLT 38 (L) 11/09/2019     STUDIES: No results found.  ASSESSMENT: ITP  PLAN:    1.  ITP: Confirmed with normal bone marrow biopsy and normal FISH and cytogenetics on July 22, 2019.  Patient has a positive ANA which is likely clinically insignificant.  The remainder of her laboratory work is either negative or within normal limits. CT scan on March 04, 2017 did not reveal splenomegaly.  Despite 1 week of prednisone at 1 mg/kg dose, patient had no appreciable change in her platelet count.  Patient will return to clinic in 1 week to initiate  cycle 1 of 4 of weekly Rituxan.     2.  Positive ANA: Likely clinically insignificant.  Referral was previously sent to rheumatology.  I provided 30 minutes of face-to-face video visit time during this encounter which included chart review, counseling, and coordination of care as documented above.   Patient expressed understanding and was in agreement with this plan. She also understands that She can call clinic at any time with any questions, concerns, or complaints.    Lloyd Huger, MD   11/11/2019 8:40 AM

## 2019-11-09 ENCOUNTER — Other Ambulatory Visit: Payer: Self-pay

## 2019-11-09 ENCOUNTER — Inpatient Hospital Stay: Payer: BC Managed Care – PPO

## 2019-11-09 DIAGNOSIS — D696 Thrombocytopenia, unspecified: Secondary | ICD-10-CM | POA: Diagnosis not present

## 2019-11-09 LAB — CBC WITH DIFFERENTIAL/PLATELET
Abs Immature Granulocytes: 0.05 10*3/uL (ref 0.00–0.07)
Basophils Absolute: 0 10*3/uL (ref 0.0–0.1)
Basophils Relative: 0 %
Eosinophils Absolute: 0 10*3/uL (ref 0.0–0.5)
Eosinophils Relative: 0 %
HCT: 48.4 % — ABNORMAL HIGH (ref 36.0–46.0)
Hemoglobin: 16.4 g/dL — ABNORMAL HIGH (ref 12.0–15.0)
Immature Granulocytes: 0 %
Lymphocytes Relative: 9 %
Lymphs Abs: 1 10*3/uL (ref 0.7–4.0)
MCH: 34 pg (ref 26.0–34.0)
MCHC: 33.9 g/dL (ref 30.0–36.0)
MCV: 100.2 fL — ABNORMAL HIGH (ref 80.0–100.0)
Monocytes Absolute: 0.4 10*3/uL (ref 0.1–1.0)
Monocytes Relative: 3 %
Neutro Abs: 10.5 10*3/uL — ABNORMAL HIGH (ref 1.7–7.7)
Neutrophils Relative %: 88 %
Platelets: 38 10*3/uL — ABNORMAL LOW (ref 150–400)
RBC: 4.83 MIL/uL (ref 3.87–5.11)
RDW: 12.9 % (ref 11.5–15.5)
WBC: 11.9 10*3/uL — ABNORMAL HIGH (ref 4.0–10.5)
nRBC: 0 % (ref 0.0–0.2)

## 2019-11-10 ENCOUNTER — Encounter: Payer: Self-pay | Admitting: Oncology

## 2019-11-10 ENCOUNTER — Inpatient Hospital Stay (HOSPITAL_BASED_OUTPATIENT_CLINIC_OR_DEPARTMENT_OTHER): Payer: BC Managed Care – PPO | Admitting: Oncology

## 2019-11-10 ENCOUNTER — Other Ambulatory Visit: Payer: Self-pay

## 2019-11-10 DIAGNOSIS — D696 Thrombocytopenia, unspecified: Secondary | ICD-10-CM | POA: Diagnosis not present

## 2019-11-10 DIAGNOSIS — D693 Immune thrombocytopenic purpura: Secondary | ICD-10-CM | POA: Diagnosis not present

## 2019-11-10 NOTE — Progress Notes (Signed)
Pt reports ongoing easy bruising. No other complaints or concerns.

## 2019-11-14 NOTE — Progress Notes (Signed)
  [place]  Telephone:(336) [phone] [phone]) [phone]  ID: Carly [name] OB: Mar 05, 1971  MR#: [id number]  [id number]   Lloyd Huger, MD   11/18/2019 6:48 AM     This encounter was created in error - please disregard.

## 2019-11-16 ENCOUNTER — Encounter: Payer: Self-pay | Admitting: Oncology

## 2019-11-16 NOTE — Progress Notes (Signed)
Patient called for pre assessment. Patient states is having extreme fatigue for about a month now. Also states she would like to discuss side effects to the treatment she will be receiving tomorrow. Denies any pain or other concerns.

## 2019-11-17 ENCOUNTER — Inpatient Hospital Stay: Payer: BC Managed Care – PPO

## 2019-11-17 ENCOUNTER — Inpatient Hospital Stay: Payer: BC Managed Care – PPO | Admitting: Oncology

## 2019-11-17 ENCOUNTER — Telehealth: Payer: Self-pay | Admitting: Emergency Medicine

## 2019-11-17 DIAGNOSIS — D693 Immune thrombocytopenic purpura: Secondary | ICD-10-CM

## 2019-11-17 NOTE — Telephone Encounter (Signed)
Called patient to talk to her regarding insurance auth for her rituxan. Explained that we don't have insurance authorization yet, which patient was already aware of, and that because of that, we would be cancelling her appointment for Friday. Rather than going ahead and rescheduling her and potentially having to cancel that appointment, explained to patient that we would wait to reschedule her treatment until we had insurance authorization, and then call her to get her scheduled. Pt verbalized understanding and didn't have any further questions or concerns.

## 2019-11-20 ENCOUNTER — Inpatient Hospital Stay: Payer: BC Managed Care – PPO

## 2019-11-20 ENCOUNTER — Inpatient Hospital Stay: Payer: BC Managed Care – PPO | Admitting: Oncology

## 2019-11-20 ENCOUNTER — Telehealth: Payer: Self-pay | Admitting: Internal Medicine

## 2019-11-20 NOTE — Telephone Encounter (Signed)
Agree with ED   [place]

## 2019-11-20 NOTE — Telephone Encounter (Signed)
Spoken to patient, she stated that she has low platelets, she has had a severe headache for two days and they have even woken her up at night. She also stated she is having light headedness, patient had NO blurry vision, palpitations, jaw pain, arm pain, SOB, or abnormal bleeding. Instructed patient to go to ED due to sx. Patient stated she will have her husband take her.

## 2019-11-20 NOTE — Telephone Encounter (Signed)
Pt called to be Triaged she reported that she is having a headache and has a low platelet count

## 2019-11-30 DIAGNOSIS — M722 Plantar fascial fibromatosis: Secondary | ICD-10-CM | POA: Diagnosis not present

## 2019-12-02 ENCOUNTER — Other Ambulatory Visit: Payer: Self-pay | Admitting: Oncology

## 2019-12-02 DIAGNOSIS — D693 Immune thrombocytopenic purpura: Secondary | ICD-10-CM

## 2019-12-04 ENCOUNTER — Inpatient Hospital Stay: Payer: BC Managed Care – PPO | Attending: Oncology

## 2019-12-04 ENCOUNTER — Inpatient Hospital Stay (HOSPITAL_BASED_OUTPATIENT_CLINIC_OR_DEPARTMENT_OTHER): Payer: BC Managed Care – PPO | Admitting: Oncology

## 2019-12-04 ENCOUNTER — Inpatient Hospital Stay: Payer: BC Managed Care – PPO

## 2019-12-04 ENCOUNTER — Encounter: Payer: Self-pay | Admitting: Oncology

## 2019-12-04 ENCOUNTER — Other Ambulatory Visit: Payer: Self-pay

## 2019-12-04 VITALS — BP 148/82 | HR 92 | Temp 96.9°F | Resp 17 | Wt 187.0 lb

## 2019-12-04 DIAGNOSIS — F17210 Nicotine dependence, cigarettes, uncomplicated: Secondary | ICD-10-CM | POA: Diagnosis not present

## 2019-12-04 DIAGNOSIS — D693 Immune thrombocytopenic purpura: Secondary | ICD-10-CM

## 2019-12-04 DIAGNOSIS — R760 Raised antibody titer: Secondary | ICD-10-CM | POA: Diagnosis not present

## 2019-12-04 DIAGNOSIS — D696 Thrombocytopenia, unspecified: Secondary | ICD-10-CM

## 2019-12-04 LAB — CBC WITH DIFFERENTIAL/PLATELET
Abs Immature Granulocytes: 0 10*3/uL (ref 0.00–0.07)
Basophils Absolute: 0.2 10*3/uL — ABNORMAL HIGH (ref 0.0–0.1)
Basophils Relative: 2 %
Eosinophils Absolute: 0.5 10*3/uL (ref 0.0–0.5)
Eosinophils Relative: 5 %
HCT: 44.9 % (ref 36.0–46.0)
Hemoglobin: 15.7 g/dL — ABNORMAL HIGH (ref 12.0–15.0)
Lymphocytes Relative: 25 %
Lymphs Abs: 2.3 10*3/uL (ref 0.7–4.0)
MCH: 34.2 pg — ABNORMAL HIGH (ref 26.0–34.0)
MCHC: 35 g/dL (ref 30.0–36.0)
MCV: 97.8 fL (ref 80.0–100.0)
Monocytes Absolute: 0.2 10*3/uL (ref 0.1–1.0)
Monocytes Relative: 2 %
Neutro Abs: 6 10*3/uL (ref 1.7–7.7)
Neutrophils Relative %: 66 %
Platelets: 56 10*3/uL — ABNORMAL LOW (ref 150–400)
RBC: 4.59 MIL/uL (ref 3.87–5.11)
RDW: 12.8 % (ref 11.5–15.5)
Smear Review: DECREASED
WBC: 9.1 10*3/uL (ref 4.0–10.5)
nRBC: 0 % (ref 0.0–0.2)

## 2019-12-04 LAB — HEPATITIS B SURFACE ANTIGEN: Hepatitis B Surface Ag: NONREACTIVE

## 2019-12-04 LAB — HEPATITIS B CORE ANTIBODY, TOTAL: Hep B Core Total Ab: NONREACTIVE

## 2019-12-04 MED ORDER — ROMIPLOSTIM 125 MCG ~~LOC~~ SOLR
1.0000 ug/kg | Freq: Once | SUBCUTANEOUS | Status: AC
  Administered 2019-12-04: 85 ug via SUBCUTANEOUS
  Filled 2019-12-04: qty 0.17

## 2019-12-04 NOTE — Progress Notes (Signed)
Pt here for follow up. Reports fatigue, easy bruising, and intermittent light headedness

## 2019-12-04 NOTE — Progress Notes (Signed)
[name]  Telephone:(336) [phone] [phone]) [phone]  ID: Carly [name] OB: 1971/05/26  MR#: [id number]  [id number]#:[id number]  Patient Care Team: McLean-Scocuzza, Nino Glow, MD as PCP - General (Internal Medicine) Lloyd Huger, MD as Consulting Physician (Oncology)   CHIEF COMPLAINT: ITP.  INTERVAL HISTORY: Patient returns to clinic today for further evaluation and initiation of Nplate.  Rituxan was denied by insurance.  She currently feels well and is asymptomatic.  She does not complain of any easy bleeding or bruising.  She has no neurologic complaints.  She denies any recent fevers or illnesses.  She has a good appetite and denies weight loss.  She denies any chest pain, shortness of breath, cough, or hemoptysis. She denies any nausea, vomiting, constipation, or diarrhea.  She has no urinary complaints.  Patient offers no specific complaints today.  REVIEW OF SYSTEMS:   Review of Systems  Constitutional: Negative.  Negative for fever, malaise/fatigue and weight loss.  Respiratory: Negative.  Negative for cough, hemoptysis and shortness of breath.   Cardiovascular: Negative.  Negative for chest pain and leg swelling.  Gastrointestinal: Negative.  Negative for abdominal pain, blood in stool and melena.  Genitourinary: Negative.  Negative for hematuria.  Musculoskeletal: Negative.  Negative for back pain.  Skin: Negative.  Negative for rash.  Neurological: Negative.  Negative for dizziness, focal weakness, weakness and headaches.  Endo/Heme/Allergies: Negative.  Does not bruise/bleed easily.  Psychiatric/Behavioral: Negative.  The patient is not nervous/anxious.     As per HPI. Otherwise, a complete review of systems is negative.  PAST MEDICAL HISTORY: Past Medical History:  Diagnosis Date  . Anxiety   . Chicken pox   . Depression   . Elevated testosterone level in female   . Emphysema of lung (HCC)    bronchitis  . Frequent headaches   . Hay fever   .  Hypertension     PAST SURGICAL HISTORY: Past Surgical History:  Procedure Laterality Date  . HYSTEROSCOPY WITH NOVASURE N/A 02/21/2018   Procedure: HYSTEROSCOPY WITH NOVASURE, POLYPECTOMY;  Surgeon: Benjaman Kindler, MD;  Location: [facility] ORS;  Service: Gynecology;  Laterality: N/A;  . TUBAL LIGATION  1997  . TUBAL LIGATION      FAMILY HISTORY: Family History  Problem Relation Age of Onset  . Hyperlipidemia Mother   . Heart disease Mother   . Stroke Mother   . Depression Mother   . Mental illness Mother   . Heart attack Mother 9  . Hyperlipidemia Father   . Depression Father   . Mental illness Father   . Diabetes Paternal Grandfather   . Cancer Cousin     ADVANCED DIRECTIVES (Y/N):  N  HEALTH MAINTENANCE: Social History   Tobacco Use  . Smoking status: Current Every Day Smoker    Packs/day: 0.50    Years: 28.00    Pack years: 14.00    Types: Cigarettes  . Smokeless tobacco: Never Used  Substance Use Topics  . Alcohol use: Yes    Alcohol/week: 8.0 standard drinks    Types: 8 Standard drinks or equivalent per week    Comment: Beer (Can)   . Drug use: No    Comment: CBD oil     Colonoscopy:  PAP:  Bone density:  Lipid panel:  Allergies  Allergen Reactions  . Meloxicam Nausea Only    Current Outpatient Medications  Medication Sig Dispense Refill  . Acetaminophen (TYLENOL EXTRA STRENGTH PO) Take by mouth.    Marland Kitchen albuterol (PROVENTIL) (2.5 MG/3ML)  0.083% nebulizer solution Take 3 mLs (2.5 mg total) by nebulization every 6 (six) hours as needed for wheezing or shortness of breath. 360 mL 11  . ALPRAZolam (XANAX) 0.25 MG tablet Take 1 tablet (0.25 mg total) by mouth daily as needed for anxiety. 30 tablet 0  . amLODipine (NORVASC) 10 MG tablet Take 1 tablet (10 mg total) by mouth daily. 90 tablet 3  . budesonide-formoterol (SYMBICORT) 160-4.5 MCG/ACT inhaler Inhale 2 puffs into the lungs 2 (two) times daily. Rinse mouth 1 Inhaler 11  . carvedilol (COREG) 25 MG  tablet Take 1 tablet (25 mg total) by mouth 2 (two) times daily with a meal. 180 tablet 3  . cetirizine (ZYRTEC) 10 MG tablet Take 1 tablet (10 mg total) by mouth daily. 90 tablet 3  . Cholecalciferol 1.25 MG (50000 UT) capsule Take 1 capsule (50,000 Units total) by mouth once a week. 13 capsule 1  . cyanocobalamin (,VITAMIN B-12,) 1000 MCG/ML injection 1 mL 1x per week x 4 weeks then every 30 days 8 mL 3  . diclofenac Sodium (VOLTAREN) 1 % GEL Apply 2-4 g topically 4 (four) times daily. 2 grams upper body qid prn and 4 gram lower body qid prn 150 g 11  . escitalopram (LEXAPRO) 20 MG tablet Take 1 tablet (20 mg total) by mouth daily. 90 tablet 3  . fluticasone (FLONASE) 50 MCG/ACT nasal spray Place 2 sprays into both nostrils daily. 16 g 11  . mirabegron ER (MYRBETRIQ) 25 MG TB24 tablet Take 1 tablet (25 mg total) by mouth daily. 90 tablet 3  . nicotine polacrilex (NICORETTE) 4 MG gum Take 1 each (4 mg total) by mouth as needed for smoking cessation. Chew 1 piece every 1-2 hours max 24 pieces per day x 1-6 weeks then week 7-9 every 2-4 hours and week 10-12 every 4-8 hours 300 tablet 5  . OVER THE COUNTER MEDICATION Take 2 drops by mouth at bedtime. CBD Oil - takes for sleep    . predniSONE (DELTASONE) 20 MG tablet Take 4 tablets (80 mg total) by mouth daily with breakfast. 28 tablet 0  . spironolactone (ALDACTONE) 25 MG tablet Take 1 tablet (25 mg total) by mouth daily. In am 90 tablet 3  . Syringe/Needle, Disp, 25G X 1-1/2" 1 ML MISC 1 Device by Does not apply route as directed. 30 each 11  . traZODone (DESYREL) 50 MG tablet Take 0.5-1 tablets (25-50 mg total) by mouth at bedtime as needed for sleep. 90 tablet 3   No current facility-administered medications for this visit.    OBJECTIVE: Vitals:   12/04/19 1309  BP: (!) 148/82  Pulse: 92  Resp: 17  Temp: (!) 96.9 F (36.1 C)  SpO2: 100%     Body mass index is 33.13 kg/m.    ECOG FS:0 - Asymptomatic  General: Well-developed,  well-nourished, no acute distress. Eyes: Pink conjunctiva, anicteric sclera. HEENT: Normocephalic, moist mucous membranes. Lungs: No audible wheezing or coughing. Heart: Regular rate and rhythm. Abdomen: Soft, nontender, no obvious distention. Musculoskeletal: No edema, cyanosis, or clubbing. Neuro: Alert, answering all questions appropriately. Cranial nerves grossly intact. Skin: No rashes or petechiae noted. Psych: Normal affect.  LAB RESULTS:  Lab Results  Component Value Date   NA 141 06/10/2019   K 3.6 06/10/2019   CL 104 06/10/2019   CO2 27 06/10/2019   GLUCOSE 95 06/10/2019   BUN 4 (L) 06/10/2019   CREATININE 0.60 06/10/2019   CALCIUM 8.9 06/10/2019   PROT 6.4 06/10/2019  ALBUMIN 3.8 06/10/2019   AST 34 06/10/2019   ALT 21 06/10/2019   ALKPHOS 73 06/10/2019   BILITOT 0.7 06/10/2019   GFRNONAA >60 12/08/2018   GFRAA >60 12/08/2018    Lab Results  Component Value Date   WBC 9.1 12/04/2019   NEUTROABS 6.0 12/04/2019   HGB 15.7 (H) 12/04/2019   HCT 44.9 12/04/2019   MCV 97.8 12/04/2019   PLT 56 (L) 12/04/2019     STUDIES: No results found.  ASSESSMENT: ITP  PLAN:    1.  ITP: Confirmed with normal bone marrow biopsy and normal FISH and cytogenetics on July 22, 2019.  Patient has a positive ANA which is likely clinically insignificant.  The remainder of her laboratory work is either negative or within normal limits. CT scan on March 04, 2017 did not reveal splenomegaly.  Despite 1 week of prednisone at 1 mg/kg dose, patient had no appreciable change in her platelet count.  Rituxan was denied by insurance, therefore will proceed with 1 mcg/kg of Nplate.  Return to clinic weekly for injection if platelet count remains below 200.  If there is no appreciable change after 2 injections, can increase to 2 mcg/kg.  Return to clinic in 4 to 5 weeks for further evaluation and continuation of treatment.   2.  Positive ANA: Likely clinically insignificant.  Referral was  previously sent to rheumatology.  I spent a total of 30 minutes reviewing chart data, face-to-face evaluation with the patient, counseling and coordination of care as detailed above.   Patient expressed understanding and was in agreement with this plan. She also understands that She can call clinic at any time with any questions, concerns, or complaints.    Lloyd Huger, MD   12/04/2019 3:33 PM

## 2019-12-09 ENCOUNTER — Encounter: Payer: Self-pay | Admitting: Pharmacy Technician

## 2019-12-09 NOTE — Progress Notes (Signed)
Patient has been approved for drug assistance by [place] for Rituxan. The enrollment period is from 12/08/19-indefinitely based on EOOP. First DOS covered is to be scheduled.

## 2019-12-11 ENCOUNTER — Other Ambulatory Visit: Payer: Self-pay

## 2019-12-11 ENCOUNTER — Inpatient Hospital Stay: Payer: BC Managed Care – PPO

## 2019-12-11 DIAGNOSIS — F17210 Nicotine dependence, cigarettes, uncomplicated: Secondary | ICD-10-CM | POA: Diagnosis not present

## 2019-12-11 DIAGNOSIS — D696 Thrombocytopenia, unspecified: Secondary | ICD-10-CM

## 2019-12-11 DIAGNOSIS — D693 Immune thrombocytopenic purpura: Secondary | ICD-10-CM | POA: Diagnosis not present

## 2019-12-11 DIAGNOSIS — R760 Raised antibody titer: Secondary | ICD-10-CM | POA: Diagnosis not present

## 2019-12-11 LAB — CBC WITH DIFFERENTIAL/PLATELET
Abs Immature Granulocytes: 0.02 10*3/uL (ref 0.00–0.07)
Basophils Absolute: 0.1 10*3/uL (ref 0.0–0.1)
Basophils Relative: 1 %
Eosinophils Absolute: 0.2 10*3/uL (ref 0.0–0.5)
Eosinophils Relative: 3 %
HCT: 45.4 % (ref 36.0–46.0)
Hemoglobin: 16 g/dL — ABNORMAL HIGH (ref 12.0–15.0)
Immature Granulocytes: 0 %
Lymphocytes Relative: 23 %
Lymphs Abs: 1.5 10*3/uL (ref 0.7–4.0)
MCH: 34.9 pg — ABNORMAL HIGH (ref 26.0–34.0)
MCHC: 35.2 g/dL (ref 30.0–36.0)
MCV: 98.9 fL (ref 80.0–100.0)
Monocytes Absolute: 0.7 10*3/uL (ref 0.1–1.0)
Monocytes Relative: 10 %
Neutro Abs: 4.2 10*3/uL (ref 1.7–7.7)
Neutrophils Relative %: 63 %
Platelets: 65 10*3/uL — ABNORMAL LOW (ref 150–400)
RBC: 4.59 MIL/uL (ref 3.87–5.11)
RDW: 12.9 % (ref 11.5–15.5)
Smear Review: DECREASED
WBC: 6.6 10*3/uL (ref 4.0–10.5)
nRBC: 0 % (ref 0.0–0.2)

## 2019-12-11 MED ORDER — ROMIPLOSTIM 125 MCG ~~LOC~~ SOLR
1.0000 ug/kg | Freq: Once | SUBCUTANEOUS | Status: AC
  Administered 2019-12-11: 85 ug via SUBCUTANEOUS
  Filled 2019-12-11: qty 0.17

## 2019-12-17 ENCOUNTER — Telehealth: Payer: Self-pay | Admitting: Internal Medicine

## 2019-12-17 NOTE — Telephone Encounter (Signed)
Voicemail is full- unable to leave message for patient to call back to schedule -Raquel Sarna" [facility] PA said 2 months ago  "Rejection Reason - Patient did not respond" [facility] PA said about 2 hours ago  I spoke with about referral I advised pt to call and sch that the ofc is trying to reach her to sch. Pt understood.

## 2019-12-18 ENCOUNTER — Inpatient Hospital Stay: Payer: BC Managed Care – PPO | Attending: Oncology

## 2019-12-18 ENCOUNTER — Other Ambulatory Visit: Payer: Self-pay

## 2019-12-18 ENCOUNTER — Inpatient Hospital Stay: Payer: BC Managed Care – PPO

## 2019-12-18 DIAGNOSIS — D696 Thrombocytopenia, unspecified: Secondary | ICD-10-CM

## 2019-12-18 DIAGNOSIS — D693 Immune thrombocytopenic purpura: Secondary | ICD-10-CM | POA: Insufficient documentation

## 2019-12-18 LAB — CBC WITH DIFFERENTIAL/PLATELET
Abs Immature Granulocytes: 0.01 10*3/uL (ref 0.00–0.07)
Basophils Absolute: 0.1 10*3/uL (ref 0.0–0.1)
Basophils Relative: 1 %
Eosinophils Absolute: 0.2 10*3/uL (ref 0.0–0.5)
Eosinophils Relative: 2 %
HCT: 45.2 % (ref 36.0–46.0)
Hemoglobin: 15.7 g/dL — ABNORMAL HIGH (ref 12.0–15.0)
Immature Granulocytes: 0 %
Lymphocytes Relative: 34 %
Lymphs Abs: 2.8 10*3/uL (ref 0.7–4.0)
MCH: 33.9 pg (ref 26.0–34.0)
MCHC: 34.7 g/dL (ref 30.0–36.0)
MCV: 97.6 fL (ref 80.0–100.0)
Monocytes Absolute: 1 10*3/uL (ref 0.1–1.0)
Monocytes Relative: 12 %
Neutro Abs: 4.2 10*3/uL (ref 1.7–7.7)
Neutrophils Relative %: 51 %
Platelets: 92 10*3/uL — ABNORMAL LOW (ref 150–400)
RBC: 4.63 MIL/uL (ref 3.87–5.11)
RDW: 12.9 % (ref 11.5–15.5)
Smear Review: DECREASED
WBC: 8.1 10*3/uL (ref 4.0–10.5)
nRBC: 0 % (ref 0.0–0.2)

## 2019-12-18 MED ORDER — ROMIPLOSTIM 125 MCG ~~LOC~~ SOLR
85.0000 ug | Freq: Once | SUBCUTANEOUS | Status: AC
  Administered 2019-12-18: 85 ug via SUBCUTANEOUS
  Filled 2019-12-18: qty 0.17

## 2019-12-24 ENCOUNTER — Telehealth (INDEPENDENT_AMBULATORY_CARE_PROVIDER_SITE_OTHER): Payer: BC Managed Care – PPO | Admitting: Internal Medicine

## 2019-12-24 ENCOUNTER — Encounter: Payer: Self-pay | Admitting: Internal Medicine

## 2019-12-24 VITALS — BP 117/62 | HR 95 | Ht 63.0 in | Wt 185.0 lb

## 2019-12-24 DIAGNOSIS — R5383 Other fatigue: Secondary | ICD-10-CM | POA: Diagnosis not present

## 2019-12-24 DIAGNOSIS — K760 Fatty (change of) liver, not elsewhere classified: Secondary | ICD-10-CM | POA: Insufficient documentation

## 2019-12-24 DIAGNOSIS — D1771 Benign lipomatous neoplasm of kidney: Secondary | ICD-10-CM | POA: Insufficient documentation

## 2019-12-24 DIAGNOSIS — E559 Vitamin D deficiency, unspecified: Secondary | ICD-10-CM

## 2019-12-24 DIAGNOSIS — D693 Immune thrombocytopenic purpura: Secondary | ICD-10-CM | POA: Insufficient documentation

## 2019-12-24 DIAGNOSIS — R110 Nausea: Secondary | ICD-10-CM

## 2019-12-24 DIAGNOSIS — R109 Unspecified abdominal pain: Secondary | ICD-10-CM

## 2019-12-24 DIAGNOSIS — E538 Deficiency of other specified B group vitamins: Secondary | ICD-10-CM

## 2019-12-24 MED ORDER — ONDANSETRON HCL 4 MG PO TABS: 4.0000 mg | ORAL_TABLET | Freq: Three times a day (TID) | ORAL | 2 refills | Status: DC | PRN

## 2019-12-24 MED ORDER — DICYCLOMINE HCL 10 MG PO CAPS: 10.0000 mg | ORAL_CAPSULE | Freq: Three times a day (TID) | ORAL | 2 refills | Status: DC | PRN

## 2019-12-24 NOTE — Progress Notes (Signed)
Virtual Visit via Video Note  I connected with Carly [name] on 12/24/19 at  1:30 PM EDT by a video enabled telemedicine application and verified that I am speaking with the correct person using two identifiers.  Location patient: home Location provider:work or home office Persons participating in the virtual visit: patient, provider  I discussed the limitations of evaluation and management by telemedicine and the availability of in person appointments. The patient expressed understanding and agreed to proceed.   HPI: 1. 12/18/19 had 1st infusion Romiplostim for chronic ITP plts went from 65 to 92 and another infusion 12/25/19 but having nausea, ab cramps with stools on and off diarrhea and nausea .nothing tried. She noticed since infusions   2. Fatigue better on B12 injections Q30 days, and D3 1x per week and she is taking this   ROS: See pertinent positives and negatives per HPI.  Past Medical History:  Diagnosis Date  . Anxiety   . Chicken pox   . Depression   . Elevated testosterone level in female   . Emphysema of lung (HCC)    bronchitis  . Frequent headaches   . Hay fever   . Hypertension     Past Surgical History:  Procedure Laterality Date  . HYSTEROSCOPY WITH NOVASURE N/A 02/21/2018   Procedure: HYSTEROSCOPY WITH NOVASURE, POLYPECTOMY;  Surgeon: Benjaman Kindler, MD;  Location: [facility] ORS;  Service: Gynecology;  Laterality: N/A;  . TUBAL LIGATION  1997  . TUBAL LIGATION      Family History  Problem Relation Age of Onset  . Hyperlipidemia Mother   . Heart disease Mother   . Stroke Mother   . Depression Mother   . Mental illness Mother   . Heart attack Mother 69  . Hyperlipidemia Father   . Depression Father   . Mental illness Father   . Diabetes Paternal Grandfather   . Cancer Cousin     SOCIAL HX: married lives with husband  Current Outpatient Medications:  .  Acetaminophen (TYLENOL EXTRA STRENGTH PO), Take by mouth., Disp: , Rfl:  .  albuterol (PROVENTIL)  (2.5 MG/3ML) 0.083% nebulizer solution, Take 3 mLs (2.5 mg total) by nebulization every 6 (six) hours as needed for wheezing or shortness of breath., Disp: 360 mL, Rfl: 11 .  ALPRAZolam (XANAX) 0.25 MG tablet, Take 1 tablet (0.25 mg total) by mouth daily as needed for anxiety., Disp: 30 tablet, Rfl: 0 .  amLODipine (NORVASC) 10 MG tablet, Take 1 tablet (10 mg total) by mouth daily., Disp: 90 tablet, Rfl: 3 .  budesonide-formoterol (SYMBICORT) 160-4.5 MCG/ACT inhaler, Inhale 2 puffs into the lungs 2 (two) times daily. Rinse mouth, Disp: 1 Inhaler, Rfl: 11 .  carvedilol (COREG) 25 MG tablet, Take 1 tablet (25 mg total) by mouth 2 (two) times daily with a meal., Disp: 180 tablet, Rfl: 3 .  cetirizine (ZYRTEC) 10 MG tablet, Take 1 tablet (10 mg total) by mouth daily., Disp: 90 tablet, Rfl: 3 .  Cholecalciferol 1.25 MG (50000 UT) capsule, Take 1 capsule (50,000 Units total) by mouth once a week., Disp: 13 capsule, Rfl: 1 .  cyanocobalamin (,VITAMIN B-12,) 1000 MCG/ML injection, 1 mL 1x per week x 4 weeks then every 30 days, Disp: 8 mL, Rfl: 3 .  diclofenac Sodium (VOLTAREN) 1 % GEL, Apply 2-4 g topically 4 (four) times daily. 2 grams upper body qid prn and 4 gram lower body qid prn, Disp: 150 g, Rfl: 11 .  escitalopram (LEXAPRO) 20 MG tablet, Take 1 tablet (20  mg total) by mouth daily., Disp: 90 tablet, Rfl: 3 .  fluticasone (FLONASE) 50 MCG/ACT nasal spray, Place 2 sprays into both nostrils daily., Disp: 16 g, Rfl: 11 .  mirabegron ER (MYRBETRIQ) 25 MG TB24 tablet, Take 1 tablet (25 mg total) by mouth daily., Disp: 90 tablet, Rfl: 3 .  nicotine polacrilex (NICORETTE) 4 MG gum, Take 1 each (4 mg total) by mouth as needed for smoking cessation. Chew 1 piece every 1-2 hours max 24 pieces per day x 1-6 weeks then week 7-9 every 2-4 hours and week 10-12 every 4-8 hours, Disp: 300 tablet, Rfl: 5 .  spironolactone (ALDACTONE) 25 MG tablet, Take 1 tablet (25 mg total) by mouth daily. In am, Disp: 90 tablet, Rfl:  3 .  Syringe/Needle, Disp, 25G X 1-1/2" 1 ML MISC, 1 Device by Does not apply route as directed., Disp: 30 each, Rfl: 11 .  traZODone (DESYREL) 50 MG tablet, Take 0.5-1 tablets (25-50 mg total) by mouth at bedtime as needed for sleep., Disp: 90 tablet, Rfl: 3 .  dicyclomine (BENTYL) 10 MG capsule, Take 1 capsule (10 mg total) by mouth 3 (three) times daily as needed for spasms. Before food, Disp: 90 capsule, Rfl: 2 .  ondansetron (ZOFRAN) 4 MG tablet, Take 1 tablet (4 mg total) by mouth every 8 (eight) hours as needed for nausea or vomiting., Disp: 40 tablet, Rfl: 2  EXAM:  VITALS per patient if applicable:  GENERAL: alert, oriented, appears well and in no acute distress  HEENT: atraumatic, conjunttiva clear, no obvious abnormalities on inspection of external nose and ears  NECK: normal movements of the head and neck  LUNGS: on inspection no signs of respiratory distress, breathing rate appears normal, no obvious gross SOB, gasping or wheezing  CV: no obvious cyanosis  MS: moves all visible extremities without noticeable abnormality  PSYCH/NEURO: pleasant and cooperative, no obvious depression or anxiety, speech and thought processing grossly intact  ASSESSMENT AND PLAN:  Discussed the following assessment and plan:  Nausea - Plan: ondansetron (ZOFRAN) 4 MG tablet Ab spasms/cramps   This can be side effect of romiplostim  Prn bentyl  If not better consider US abdomen and GI referral [place]   Chronic ITP (idiopathic thrombocytopenia) (HCC) Improved on Romiplostim injection plts 65 to 92 2nd infusion 12/25/19 per pt will then do Q2 weeks then monthly   Fatigue, unspecified type improved on B12 Q30 d and Vit D 1x per week  Abdominal spasms - Plan: dicyclomine (BENTYL) 10 MG capsule  B12 deficiency Vitamin D deficiency Cont meds  See above  HM Flu shot utd  tdap utd due in 2026  covid 19 vaccine consider in future will need to be approved Dr. Grayland Ormond due to chronic  ITP  Pap overdue sch Dr. Smitty Knudsen change to Dr. WArd s/p ablation and cervical polyps -she will call [facility] ob[facility]gyn Dr. Leonides Schanz in [place] and get this scheduled due for pap  mammo 07/20/19   Colonoscopy consider in future [facility] female[facility] low plts 1st consider in future   Skin referred [address] derm appt sch'ed or had   -we discussed possible serious and likely etiologies, options for evaluation and workup, limitations of telemedicine visit vs in person visit, treatment, treatment risks and precautions. Pt prefers to treat via telemedicine empirically rather then risking or undertaking an in person visit at this moment. Patient agrees to seek prompt in person care if worsening, new symptoms arise, or if is not improving with treatment.  I discussed the assessment and treatment plan with the patient. The patient was provided an opportunity to ask questions and all were answered. The patient agreed with the plan and demonstrated an understanding of the instructions.   The patient was advised to call back or seek an in-person evaluation if the symptoms worsen or if the condition fails to improve as anticipated.  Time spent 20 minutes Delorise Jackson, MD

## 2019-12-25 ENCOUNTER — Other Ambulatory Visit: Payer: Self-pay | Admitting: Internal Medicine

## 2019-12-25 ENCOUNTER — Other Ambulatory Visit: Payer: Self-pay

## 2019-12-25 ENCOUNTER — Inpatient Hospital Stay: Payer: BC Managed Care – PPO

## 2019-12-25 DIAGNOSIS — D693 Immune thrombocytopenic purpura: Secondary | ICD-10-CM | POA: Diagnosis not present

## 2019-12-25 DIAGNOSIS — E559 Vitamin D deficiency, unspecified: Secondary | ICD-10-CM

## 2019-12-25 DIAGNOSIS — D696 Thrombocytopenia, unspecified: Secondary | ICD-10-CM

## 2019-12-25 LAB — CBC WITH DIFFERENTIAL/PLATELET
Abs Immature Granulocytes: 0.04 10*3/uL (ref 0.00–0.07)
Basophils Absolute: 0.1 10*3/uL (ref 0.0–0.1)
Basophils Relative: 1 %
Eosinophils Absolute: 0.2 10*3/uL (ref 0.0–0.5)
Eosinophils Relative: 2 %
HCT: 47.6 % — ABNORMAL HIGH (ref 36.0–46.0)
Hemoglobin: 16.2 g/dL — ABNORMAL HIGH (ref 12.0–15.0)
Immature Granulocytes: 0 %
Lymphocytes Relative: 25 %
Lymphs Abs: 2.8 10*3/uL (ref 0.7–4.0)
MCH: 33.5 pg (ref 26.0–34.0)
MCHC: 34 g/dL (ref 30.0–36.0)
MCV: 98.6 fL (ref 80.0–100.0)
Monocytes Absolute: 1.1 10*3/uL — ABNORMAL HIGH (ref 0.1–1.0)
Monocytes Relative: 10 %
Neutro Abs: 6.8 10*3/uL (ref 1.7–7.7)
Neutrophils Relative %: 62 %
Platelets: 111 10*3/uL — ABNORMAL LOW (ref 150–400)
RBC: 4.83 MIL/uL (ref 3.87–5.11)
RDW: 12.7 % (ref 11.5–15.5)
WBC: 11.1 10*3/uL — ABNORMAL HIGH (ref 4.0–10.5)
nRBC: 0 % (ref 0.0–0.2)

## 2019-12-25 MED ORDER — ROMIPLOSTIM 125 MCG ~~LOC~~ SOLR
1.0000 ug/kg | Freq: Once | SUBCUTANEOUS | Status: AC
  Administered 2019-12-25: 85 ug via SUBCUTANEOUS
  Filled 2019-12-25: qty 0.17

## 2019-12-25 MED ORDER — CHOLECALCIFEROL 1.25 MG (50000 UT) PO CAPS: 50000.0000 [IU] | ORAL_CAPSULE | ORAL | 0 refills | Status: DC

## 2019-12-31 ENCOUNTER — Telehealth: Payer: BC Managed Care – PPO | Admitting: Family

## 2019-12-31 DIAGNOSIS — K644 Residual hemorrhoidal skin tags: Secondary | ICD-10-CM | POA: Diagnosis not present

## 2019-12-31 MED ORDER — HYDROCORTISONE ACETATE 25 MG RE SUPP
25.0000 mg | Freq: Two times a day (BID) | RECTAL | 0 refills | Status: DC
Start: 2019-12-31 — End: 2020-03-01

## 2019-12-31 NOTE — Progress Notes (Signed)
E-Visit for Hemorrhoid  We are sorry that you are not feeling well. We are here to help!  Hemorrhoids are swollen veins in the rectum. They can cause itching, bleeding, and pain. Hemorrhoids are very common.  In some cases, you can see or feel hemorrhoids around the outside of the rectum. In other cases, you cannot see them because they are hidden inside the rectum. Be patient - It can take months for this to improve or go away.   Hemorrhoids do not always cause symptoms. But when they do, symptoms can include: ?Itching of the skin around the anus ?Bleeding - Bleeding is usually painless. You might see bright red blood after using the toilet. ?Pain - If a blood clot forms inside a hemorrhoid, this can cause pain. It can also cause a lump that you might be able to feel.   What can I do to keep from getting more hemorrhoids? -- The most important thing you can do is to keep from getting constipated. You should have a bowel movement at least a few times a week. When you have a bowel movement, you also should not have to push too much. Plus, your bowel movements should not be too hard. Being constipated and having hard bowel movements can make hemorrhoids worse.   I have prescribed Anusol HC suppositories.  Insert into rectum twice per day for 6 days  HOME CARE: . Sitz Baths twice daily. Soak buttocks in 2 or 3 inches of warm water for 10 to 15 minutes. Do not add soap, bubble bath, or anything to the water. . Stool softener such as Colace 100 mg twice daily AND Miralax 1 scoop daily until you have regular soft stools . Over the counter Preparation H . Tucks Pads . Witch Hazel  Here are some steps you can take to avoid getting constipated or having hard stools:  ?Eat lots of fruits, vegetables, and other foods with fiber. Fiber helps to increase bowel movements. If you do not get enough fiber from your diet, you can take fiber supplements. These come in the form of powders, wafers, or pills.  Some examples are Metamucil, Citrucel, Benefiber and FiberCon. If you take a fiber supplement, be sure to read the label so you know how much to take. If you're not sure, ask your provider or nurse. ?Take medicines called "stool softeners" such as docusate sodium (sample brand names: Colace, Dulcolax). These medicines increase the number of bowel movements you have. They are safe to take and they can prevent problems later.  You should request a referral for a follow up evaluation with a Gastroenterologist (GI doctor) to evaluate this chronic and relapsing condition - even if it improves to see what further steps need to be taken. This is highly linked to chronic constipation and straining to have a bowel movement. It may require further treatment or surgical intervention.   GET HELP RIGHT AWAY IF: . You develop severe pain . You have heavy bleeding   FOLLOW UP WITH YOUR PRIMARY PROVIDER IF: . If your symptoms do not improve within 10 days  MAKE SURE YOU   Understand these instructions.  Will watch your condition.  Will get help right away if you are not doing well or get worse.  Your e-visit answers were reviewed by a board certified advanced clinical practitioner to complete your personal care plan. Depending upon the condition, your plan could have included both over the counter or prescription medications.  Your safety is important to   us. If you have drug allergies check your prescription carefully.   You can use MyChart to ask questions about today's visit, request a non-urgent call back, or ask for a work or school excuse for 24 hours related to this e-Visit. If it has been greater than 24 hours you will need to follow up with your provider, or enter a new e-Visit to address those concerns.  You will get an e-mail with a link to a survey asking about your experience.  We hope that your e-visit has been valuable and will speed your recovery! Thank you for using e-visits.  Greater than  5 minutes, yet less than 10 minutes of time have been spent researching, coordinating, and implementing care for this patient today.  Thank you for the details you included in the comment boxes. Those details are very helpful in determining the best course of treatment for you and help us to provide the best care.        

## 2020-01-01 ENCOUNTER — Other Ambulatory Visit: Payer: Self-pay

## 2020-01-01 ENCOUNTER — Inpatient Hospital Stay: Payer: BC Managed Care – PPO

## 2020-01-01 VITALS — BP 154/90 | HR 89

## 2020-01-01 DIAGNOSIS — D693 Immune thrombocytopenic purpura: Secondary | ICD-10-CM

## 2020-01-01 DIAGNOSIS — D696 Thrombocytopenia, unspecified: Secondary | ICD-10-CM

## 2020-01-01 LAB — CBC WITH DIFFERENTIAL/PLATELET
Abs Immature Granulocytes: 0.02 10*3/uL (ref 0.00–0.07)
Basophils Absolute: 0.1 10*3/uL (ref 0.0–0.1)
Basophils Relative: 1 %
Eosinophils Absolute: 0.2 10*3/uL (ref 0.0–0.5)
Eosinophils Relative: 2 %
HCT: 45.6 % (ref 36.0–46.0)
Hemoglobin: 15.7 g/dL — ABNORMAL HIGH (ref 12.0–15.0)
Immature Granulocytes: 0 %
Lymphocytes Relative: 25 %
Lymphs Abs: 2.1 10*3/uL (ref 0.7–4.0)
MCH: 33.9 pg (ref 26.0–34.0)
MCHC: 34.4 g/dL (ref 30.0–36.0)
MCV: 98.5 fL (ref 80.0–100.0)
Monocytes Absolute: 0.9 10*3/uL (ref 0.1–1.0)
Monocytes Relative: 10 %
Neutro Abs: 5.4 10*3/uL (ref 1.7–7.7)
Neutrophils Relative %: 62 %
Platelets: 102 10*3/uL — ABNORMAL LOW (ref 150–400)
RBC: 4.63 MIL/uL (ref 3.87–5.11)
RDW: 12.5 % (ref 11.5–15.5)
WBC: 8.6 10*3/uL (ref 4.0–10.5)
nRBC: 0 % (ref 0.0–0.2)

## 2020-01-01 MED ORDER — ROMIPLOSTIM 125 MCG ~~LOC~~ SOLR
1.0000 ug/kg | Freq: Once | SUBCUTANEOUS | Status: AC
  Administered 2020-01-01: 85 ug via SUBCUTANEOUS
  Filled 2020-01-01: qty 0.17

## 2020-01-04 ENCOUNTER — Other Ambulatory Visit: Payer: Self-pay | Admitting: Internal Medicine

## 2020-01-04 DIAGNOSIS — F32A Depression, unspecified: Secondary | ICD-10-CM

## 2020-01-04 DIAGNOSIS — F329 Major depressive disorder, single episode, unspecified: Secondary | ICD-10-CM

## 2020-01-04 NOTE — Telephone Encounter (Signed)
Patient requesting a refill of Xanax. Last refilled on 11/05/2019. Last appointment on 09/29/2019

## 2020-01-05 NOTE — Telephone Encounter (Signed)
Patient stated that she takes the xanax as needed and that she can wait until Dr. Terese Door comes back on Monday.

## 2020-01-05 NOTE — Telephone Encounter (Signed)
Please see if she has enough of this to cover until Dr McLean-Scocuzza is back. Please also see how often she is taking this.

## 2020-01-07 ENCOUNTER — Ambulatory Visit (INDEPENDENT_AMBULATORY_CARE_PROVIDER_SITE_OTHER): Payer: BC Managed Care – PPO | Admitting: Psychology

## 2020-01-07 DIAGNOSIS — F411 Generalized anxiety disorder: Secondary | ICD-10-CM

## 2020-01-07 NOTE — Progress Notes (Signed)
[place]  [phone] [phone]) [phone]  ID: Carly [name] OB: [birth year]  MR#: [id number]  UMP#:[id number]  Patient Care Team: McLean-Scocuzza, Nino Glow, MD as PCP - General (Internal Medicine) Lloyd Huger, MD as Consulting Physician (Oncology)   CHIEF COMPLAINT: ITP.  INTERVAL HISTORY: Patient returns to clinic today for repeat laboratory work, further evaluation, and continuation of Nplate.  She continues to feel well and remains asymptomatic. She does not complain of any easy bleeding or bruising.  She has no neurologic complaints.  She denies any recent fevers or illnesses.  She has a good appetite and denies weight loss.  She denies any chest pain, shortness of breath, cough, or hemoptysis. She denies any nausea, vomiting, constipation, or diarrhea.  She has no urinary complaints.  Patient offers no specific complaints today.  REVIEW OF SYSTEMS:   Review of Systems  Constitutional: Negative.  Negative for fever, malaise/fatigue and weight loss.  Respiratory: Negative.  Negative for cough, hemoptysis and shortness of breath.   Cardiovascular: Negative.  Negative for chest pain and leg swelling.  Gastrointestinal: Negative.  Negative for abdominal pain, blood in stool and melena.  Genitourinary: Negative.  Negative for hematuria.  Musculoskeletal: Negative.  Negative for back pain.  Skin: Negative.  Negative for rash.  Neurological: Negative.  Negative for dizziness, focal weakness, weakness and headaches.  Endo/Heme/Allergies: Negative.  Does not bruise/bleed easily.  Psychiatric/Behavioral: Negative.  The patient is not nervous/anxious.     As per HPI. Otherwise, a complete review of systems is negative.  PAST MEDICAL HISTORY: Past Medical History:  Diagnosis Date  . Anxiety   . Chicken pox   . Depression   . Elevated testosterone level in female   . Emphysema of lung (HCC)    bronchitis  . Frequent headaches   . Hay fever   .  Hypertension     PAST SURGICAL HISTORY: Past Surgical History:  Procedure Laterality Date  . HYSTEROSCOPY WITH NOVASURE N/A 02/21/2018   Procedure: HYSTEROSCOPY WITH NOVASURE, POLYPECTOMY;  Surgeon: Benjaman Kindler, MD;  Location: [facility] ORS;  Service: Gynecology;  Laterality: N/A;  . TUBAL LIGATION  1997  . TUBAL LIGATION      FAMILY HISTORY: Family History  Problem Relation Age of Onset  . Hyperlipidemia Mother   . Heart disease Mother   . Stroke Mother   . Depression Mother   . Mental illness Mother   . Heart attack Mother 27  . Hyperlipidemia Father   . Depression Father   . Mental illness Father   . Diabetes Paternal Grandfather   . Cancer Cousin     ADVANCED DIRECTIVES (Y/N):  N  HEALTH MAINTENANCE: Social History   Tobacco Use  . Smoking status: Current Every Day Smoker    Packs/day: 0.50    Years: 28.00    Pack years: 14.00    Types: Cigarettes  . Smokeless tobacco: Never Used  Vaping Use  . Vaping Use: Never used  Substance Use Topics  . Alcohol use: Yes    Alcohol/week: 8.0 standard drinks    Types: 8 Standard drinks or equivalent per week    Comment: Beer (Can)   . Drug use: No    Comment: CBD oil     Colonoscopy:  PAP:  Bone density:  Lipid panel:  Allergies  Allergen Reactions  . Meloxicam Nausea Only    Current Outpatient Medications  Medication Sig Dispense Refill  . Acetaminophen (TYLENOL EXTRA STRENGTH PO) Take by mouth.    Marland Kitchen  albuterol (PROVENTIL) (2.5 MG/3ML) 0.083% nebulizer solution Take 3 mLs (2.5 mg total) by nebulization every 6 (six) hours as needed for wheezing or shortness of breath. 360 mL 11  . amLODipine (NORVASC) 10 MG tablet Take 1 tablet (10 mg total) by mouth daily. 90 tablet 3  . budesonide-formoterol (SYMBICORT) 160-4.5 MCG/ACT inhaler Inhale 2 puffs into the lungs 2 (two) times daily. Rinse mouth 1 Inhaler 11  . carvedilol (COREG) 25 MG tablet Take 1 tablet (25 mg total) by mouth 2 (two) times daily with a meal. 180  tablet 3  . cetirizine (ZYRTEC) 10 MG tablet Take 1 tablet (10 mg total) by mouth daily. 90 tablet 3  . Cholecalciferol 1.25 MG (50000 UT) capsule Take 1 capsule (50,000 Units total) by mouth every 30 (thirty) days. 1x per month note this 13 capsule 0  . cyanocobalamin (,VITAMIN B-12,) 1000 MCG/ML injection 1 mL 1x per week x 4 weeks then every 30 days 8 mL 3  . diclofenac Sodium (VOLTAREN) 1 % GEL Apply 2-4 g topically 4 (four) times daily. 2 grams upper body qid prn and 4 gram lower body qid prn 150 g 11  . dicyclomine (BENTYL) 10 MG capsule Take 1 capsule (10 mg total) by mouth 3 (three) times daily as needed for spasms. Before food 90 capsule 2  . escitalopram (LEXAPRO) 20 MG tablet Take 1 tablet (20 mg total) by mouth daily. 90 tablet 3  . fluticasone (FLONASE) 50 MCG/ACT nasal spray Place 2 sprays into both nostrils daily. 16 g 11  . hydrocortisone (ANUSOL-HC) 25 MG suppository Place 1 suppository (25 mg total) rectally 2 (two) times daily. 12 suppository 0  . mirabegron ER (MYRBETRIQ) 25 MG TB24 tablet Take 1 tablet (25 mg total) by mouth daily. 90 tablet 3  . nicotine polacrilex (NICORETTE) 4 MG gum Take 1 each (4 mg total) by mouth as needed for smoking cessation. Chew 1 piece every 1-2 hours max 24 pieces per day x 1-6 weeks then week 7-9 every 2-4 hours and week 10-12 every 4-8 hours 300 tablet 5  . ondansetron (ZOFRAN) 4 MG tablet Take 1 tablet (4 mg total) by mouth every 8 (eight) hours as needed for nausea or vomiting. 40 tablet 2  . spironolactone (ALDACTONE) 25 MG tablet Take 1 tablet (25 mg total) by mouth daily. In am 90 tablet 3  . Syringe/Needle, Disp, 25G X 1-1/2" 1 ML MISC 1 Device by Does not apply route as directed. 30 each 11  . traZODone (DESYREL) 50 MG tablet Take 0.5-1 tablets (25-50 mg total) by mouth at bedtime as needed for sleep. 90 tablet 3  . ALPRAZolam (XANAX) 0.25 MG tablet Take 1 tablet (0.25 mg total) by mouth daily as needed for anxiety. 30 tablet 2   No  current facility-administered medications for this visit.    OBJECTIVE: Vitals:   01/11/20 1509  BP: (!) 141/84  Pulse: 85  Resp: 20  Temp: 98 F (36.7 C)  SpO2: 99%     Body mass index is 33.44 kg/m.    ECOG FS:0 - Asymptomatic  General: Well-developed, well-nourished, no acute distress. Eyes: Pink conjunctiva, anicteric sclera. HEENT: Normocephalic, moist mucous membranes. Lungs: No audible wheezing or coughing. Heart: Regular rate and rhythm. Abdomen: Soft, nontender, no obvious distention. Musculoskeletal: No edema, cyanosis, or clubbing. Neuro: Alert, answering all questions appropriately. Cranial nerves grossly intact. Skin: No rashes or petechiae noted. Psych: Normal affect.   LAB RESULTS:  Lab Results  Component Value Date  NA 141 06/10/2019   K 3.6 06/10/2019   CL 104 06/10/2019   CO2 27 06/10/2019   GLUCOSE 95 06/10/2019   BUN 4 (L) 06/10/2019   CREATININE 0.60 06/10/2019   CALCIUM 8.9 06/10/2019   PROT 6.4 06/10/2019   ALBUMIN 3.8 06/10/2019   AST 34 06/10/2019   ALT 21 06/10/2019   ALKPHOS 73 06/10/2019   BILITOT 0.7 06/10/2019   GFRNONAA >60 12/08/2018   GFRAA >60 12/08/2018    Lab Results  Component Value Date   WBC 9.4 01/11/2020   NEUTROABS 5.5 01/11/2020   HGB 15.6 (H) 01/11/2020   HCT 45.2 01/11/2020   MCV 98.7 01/11/2020   PLT 92 (L) 01/11/2020     STUDIES: No results found.  ASSESSMENT: ITP  PLAN:    1.  ITP: Confirmed with normal bone marrow biopsy and normal FISH and cytogenetics on July 22, 2019.  Patient has a positive ANA which is likely clinically insignificant.  The remainder of her laboratory work is either negative or within normal limits. CT scan on March 04, 2017 did not reveal splenomegaly.  Despite 1 week of prednisone at 1 mg/kg dose, patient had no appreciable change in her platelet count.  Rituxan was denied by insurance.  Patient has had weekly Nplate 1 mcg/kg with mild improvement of her platelet count to  approximately 100.  Today's result is 92.  Will increase dose to 2 mcg/kg.  Hold treatment if platelet count increases above 200.  Return to clinic weekly for laboratory work and Nplate if needed.  Patient will then return to clinic in 4 weeks for further evaluation and continuation of treatment if necessary. 2.  Positive ANA: Likely clinically insignificant.  Referral was previously sent to rheumatology.  I spent a total of 30 minutes reviewing chart data, face-to-face evaluation with the patient, counseling and coordination of care as detailed above.   Patient expressed understanding and was in agreement with this plan. She also understands that She can call clinic at any time with any questions, concerns, or complaints.    Lloyd Huger, MD   01/12/2020 7:06 AM

## 2020-01-11 ENCOUNTER — Inpatient Hospital Stay: Payer: BC Managed Care – PPO

## 2020-01-11 ENCOUNTER — Inpatient Hospital Stay (HOSPITAL_BASED_OUTPATIENT_CLINIC_OR_DEPARTMENT_OTHER): Payer: BC Managed Care – PPO | Admitting: Oncology

## 2020-01-11 ENCOUNTER — Encounter: Payer: Self-pay | Admitting: Oncology

## 2020-01-11 ENCOUNTER — Other Ambulatory Visit: Payer: Self-pay

## 2020-01-11 ENCOUNTER — Other Ambulatory Visit: Payer: Self-pay | Admitting: Internal Medicine

## 2020-01-11 VITALS — BP 141/84 | HR 85 | Temp 98.0°F | Resp 20 | Ht 63.0 in | Wt 188.8 lb

## 2020-01-11 DIAGNOSIS — D693 Immune thrombocytopenic purpura: Secondary | ICD-10-CM | POA: Diagnosis not present

## 2020-01-11 DIAGNOSIS — D696 Thrombocytopenia, unspecified: Secondary | ICD-10-CM

## 2020-01-11 DIAGNOSIS — F419 Anxiety disorder, unspecified: Secondary | ICD-10-CM

## 2020-01-11 DIAGNOSIS — F329 Major depressive disorder, single episode, unspecified: Secondary | ICD-10-CM

## 2020-01-11 LAB — CBC WITH DIFFERENTIAL/PLATELET
Abs Immature Granulocytes: 0.02 10*3/uL (ref 0.00–0.07)
Basophils Absolute: 0.1 10*3/uL (ref 0.0–0.1)
Basophils Relative: 1 %
Eosinophils Absolute: 0.2 10*3/uL (ref 0.0–0.5)
Eosinophils Relative: 2 %
HCT: 45.2 % (ref 36.0–46.0)
Hemoglobin: 15.6 g/dL — ABNORMAL HIGH (ref 12.0–15.0)
Immature Granulocytes: 0 %
Lymphocytes Relative: 29 %
Lymphs Abs: 2.7 10*3/uL (ref 0.7–4.0)
MCH: 34.1 pg — ABNORMAL HIGH (ref 26.0–34.0)
MCHC: 34.5 g/dL (ref 30.0–36.0)
MCV: 98.7 fL (ref 80.0–100.0)
Monocytes Absolute: 0.9 10*3/uL (ref 0.1–1.0)
Monocytes Relative: 10 %
Neutro Abs: 5.5 10*3/uL (ref 1.7–7.7)
Neutrophils Relative %: 58 %
Platelets: 92 10*3/uL — ABNORMAL LOW (ref 150–400)
RBC: 4.58 MIL/uL (ref 3.87–5.11)
RDW: 12.1 % (ref 11.5–15.5)
WBC: 9.4 10*3/uL (ref 4.0–10.5)
nRBC: 0 % (ref 0.0–0.2)

## 2020-01-11 MED ORDER — ALPRAZOLAM 0.25 MG PO TABS: 0.2500 mg | ORAL_TABLET | Freq: Every day | ORAL | 2 refills | Status: DC | PRN

## 2020-01-11 MED ORDER — ROMIPLOSTIM 125 MCG ~~LOC~~ SOLR
1.0000 ug/kg | Freq: Once | SUBCUTANEOUS | Status: AC
  Administered 2020-01-11: 85 ug via SUBCUTANEOUS
  Filled 2020-01-11: qty 0.17

## 2020-01-18 DIAGNOSIS — S96912A Strain of unspecified muscle and tendon at ankle and foot level, left foot, initial encounter: Secondary | ICD-10-CM | POA: Diagnosis not present

## 2020-01-22 ENCOUNTER — Inpatient Hospital Stay: Payer: BC Managed Care – PPO | Attending: Oncology

## 2020-01-22 ENCOUNTER — Inpatient Hospital Stay: Payer: BC Managed Care – PPO

## 2020-01-22 ENCOUNTER — Other Ambulatory Visit: Payer: Self-pay

## 2020-01-22 DIAGNOSIS — D693 Immune thrombocytopenic purpura: Secondary | ICD-10-CM | POA: Insufficient documentation

## 2020-01-22 DIAGNOSIS — D696 Thrombocytopenia, unspecified: Secondary | ICD-10-CM

## 2020-01-22 LAB — CBC WITH DIFFERENTIAL/PLATELET
Abs Immature Granulocytes: 0.02 10*3/uL (ref 0.00–0.07)
Basophils Absolute: 0.1 10*3/uL (ref 0.0–0.1)
Basophils Relative: 1 %
Eosinophils Absolute: 0.2 10*3/uL (ref 0.0–0.5)
Eosinophils Relative: 3 %
HCT: 46 % (ref 36.0–46.0)
Hemoglobin: 15.9 g/dL — ABNORMAL HIGH (ref 12.0–15.0)
Immature Granulocytes: 0 %
Lymphocytes Relative: 31 %
Lymphs Abs: 2.4 10*3/uL (ref 0.7–4.0)
MCH: 33.6 pg (ref 26.0–34.0)
MCHC: 34.6 g/dL (ref 30.0–36.0)
MCV: 97.3 fL (ref 80.0–100.0)
Monocytes Absolute: 0.9 10*3/uL (ref 0.1–1.0)
Monocytes Relative: 11 %
Neutro Abs: 4.3 10*3/uL (ref 1.7–7.7)
Neutrophils Relative %: 54 %
Platelets: 87 10*3/uL — ABNORMAL LOW (ref 150–400)
RBC: 4.73 MIL/uL (ref 3.87–5.11)
RDW: 12.2 % (ref 11.5–15.5)
WBC: 7.9 10*3/uL (ref 4.0–10.5)
nRBC: 0 % (ref 0.0–0.2)

## 2020-01-22 MED ORDER — ROMIPLOSTIM 250 MCG ~~LOC~~ SOLR
2.0000 ug/kg | Freq: Once | SUBCUTANEOUS | Status: AC
  Administered 2020-01-22: 170 ug via SUBCUTANEOUS
  Filled 2020-01-22: qty 0.34

## 2020-01-29 ENCOUNTER — Inpatient Hospital Stay: Payer: BC Managed Care – PPO

## 2020-01-29 ENCOUNTER — Other Ambulatory Visit: Payer: Self-pay

## 2020-01-29 DIAGNOSIS — D696 Thrombocytopenia, unspecified: Secondary | ICD-10-CM

## 2020-01-29 DIAGNOSIS — D693 Immune thrombocytopenic purpura: Secondary | ICD-10-CM | POA: Diagnosis not present

## 2020-01-29 LAB — CBC WITH DIFFERENTIAL/PLATELET
Abs Immature Granulocytes: 0.01 10*3/uL (ref 0.00–0.07)
Basophils Absolute: 0 10*3/uL (ref 0.0–0.1)
Basophils Relative: 1 %
Eosinophils Absolute: 0.2 10*3/uL (ref 0.0–0.5)
Eosinophils Relative: 2 %
HCT: 45.9 % (ref 36.0–46.0)
Hemoglobin: 15.9 g/dL — ABNORMAL HIGH (ref 12.0–15.0)
Immature Granulocytes: 0 %
Lymphocytes Relative: 30 %
Lymphs Abs: 2.2 10*3/uL (ref 0.7–4.0)
MCH: 33.6 pg (ref 26.0–34.0)
MCHC: 34.6 g/dL (ref 30.0–36.0)
MCV: 97 fL (ref 80.0–100.0)
Monocytes Absolute: 0.8 10*3/uL (ref 0.1–1.0)
Monocytes Relative: 10 %
Neutro Abs: 4.3 10*3/uL (ref 1.7–7.7)
Neutrophils Relative %: 57 %
Platelets: 163 10*3/uL (ref 150–400)
RBC: 4.73 MIL/uL (ref 3.87–5.11)
RDW: 12.2 % (ref 11.5–15.5)
WBC: 7.5 10*3/uL (ref 4.0–10.5)
nRBC: 0 % (ref 0.0–0.2)

## 2020-01-29 MED ORDER — ROMIPLOSTIM 250 MCG ~~LOC~~ SOLR
170.0000 ug | Freq: Once | SUBCUTANEOUS | Status: AC
  Administered 2020-01-29: 170 ug via SUBCUTANEOUS
  Filled 2020-01-29: qty 0.34

## 2020-02-01 ENCOUNTER — Telehealth: Payer: BC Managed Care – PPO | Admitting: Emergency Medicine

## 2020-02-01 DIAGNOSIS — R300 Dysuria: Secondary | ICD-10-CM | POA: Diagnosis not present

## 2020-02-01 MED ORDER — CEPHALEXIN 500 MG PO CAPS: 500.0000 mg | ORAL_CAPSULE | Freq: Two times a day (BID) | ORAL | 0 refills | Status: DC

## 2020-02-01 NOTE — Progress Notes (Signed)
We are sorry that you are not feeling well.  Here is how we plan to help!  Based on what you shared with me it looks like you most likely have a simple urinary tract infection.  A UTI (Urinary Tract Infection) is a bacterial infection of the bladder.  Most cases of urinary tract infections are simple to treat but a key part of your care is to encourage you to drink plenty of fluids and watch your symptoms carefully.  I have prescribed Keflex 500 mg twice a day for 7 days.  Your symptoms should gradually improve. Call us if the burning in your urine worsens, you develop worsening fever, back pain or pelvic pain or if your symptoms do not resolve after completing the antibiotic.  Urinary tract infections can be prevented by drinking plenty of water to keep your body hydrated.  Also be sure when you wipe, wipe from front to back and don't hold it in!  If possible, empty your bladder every 4 hours.  Your e-visit answers were reviewed by a board certified advanced clinical practitioner to complete your personal care plan.  Depending on the condition, your plan could have included both over the counter or prescription medications.  If there is a problem please reply  once you have received a response from your provider.  Your safety is important to us.  If you have drug allergies check your prescription carefully.    You can use MyChart to ask questions about today's visit, request a non-urgent call back, or ask for a work or school excuse for 24 hours related to this e-Visit. If it has been greater than 24 hours you will need to follow up with your provider, or enter a new e-Visit to address those concerns.   You will get an e-mail in the next two days asking about your experience.  I hope that your e-visit has been valuable and will speed your recovery. Thank you for using e-visits.   **Please do not respond to this message unless you have follow up questions.** Greater than 5 but less than 10  minutes spent researching, coordinating, and implementing care for this patient today  

## 2020-02-05 ENCOUNTER — Inpatient Hospital Stay: Payer: BC Managed Care – PPO

## 2020-02-08 ENCOUNTER — Other Ambulatory Visit: Payer: Self-pay

## 2020-02-08 ENCOUNTER — Inpatient Hospital Stay: Payer: BC Managed Care – PPO

## 2020-02-08 VITALS — Wt 186.5 lb

## 2020-02-08 DIAGNOSIS — D693 Immune thrombocytopenic purpura: Secondary | ICD-10-CM | POA: Diagnosis not present

## 2020-02-08 DIAGNOSIS — D696 Thrombocytopenia, unspecified: Secondary | ICD-10-CM

## 2020-02-08 LAB — CBC WITH DIFFERENTIAL/PLATELET
Abs Immature Granulocytes: 0.04 10*3/uL (ref 0.00–0.07)
Basophils Absolute: 0.1 10*3/uL (ref 0.0–0.1)
Basophils Relative: 1 %
Eosinophils Absolute: 0.2 10*3/uL (ref 0.0–0.5)
Eosinophils Relative: 2 %
HCT: 43.8 % (ref 36.0–46.0)
Hemoglobin: 15.1 g/dL — ABNORMAL HIGH (ref 12.0–15.0)
Immature Granulocytes: 0 %
Lymphocytes Relative: 30 %
Lymphs Abs: 2.7 10*3/uL (ref 0.7–4.0)
MCH: 33.1 pg (ref 26.0–34.0)
MCHC: 34.5 g/dL (ref 30.0–36.0)
MCV: 96.1 fL (ref 80.0–100.0)
Monocytes Absolute: 0.8 10*3/uL (ref 0.1–1.0)
Monocytes Relative: 9 %
Neutro Abs: 5.2 10*3/uL (ref 1.7–7.7)
Neutrophils Relative %: 58 %
Platelets: 116 10*3/uL — ABNORMAL LOW (ref 150–400)
RBC: 4.56 MIL/uL (ref 3.87–5.11)
RDW: 12.1 % (ref 11.5–15.5)
WBC: 9 10*3/uL (ref 4.0–10.5)
nRBC: 0 % (ref 0.0–0.2)

## 2020-02-08 MED ORDER — ROMIPLOSTIM 125 MCG ~~LOC~~ SOLR
170.0000 ug | Freq: Once | SUBCUTANEOUS | Status: AC
  Administered 2020-02-08: 170 ug via SUBCUTANEOUS
  Filled 2020-02-08: qty 0.34

## 2020-02-10 ENCOUNTER — Ambulatory Visit (INDEPENDENT_AMBULATORY_CARE_PROVIDER_SITE_OTHER): Payer: BC Managed Care – PPO | Admitting: Psychology

## 2020-02-10 DIAGNOSIS — F411 Generalized anxiety disorder: Secondary | ICD-10-CM

## 2020-02-11 ENCOUNTER — Telehealth: Payer: BC Managed Care – PPO | Admitting: Emergency Medicine

## 2020-02-11 DIAGNOSIS — J441 Chronic obstructive pulmonary disease with (acute) exacerbation: Secondary | ICD-10-CM | POA: Diagnosis not present

## 2020-02-11 DIAGNOSIS — R05 Cough: Secondary | ICD-10-CM | POA: Diagnosis not present

## 2020-02-11 DIAGNOSIS — R059 Cough, unspecified: Secondary | ICD-10-CM

## 2020-02-11 MED ORDER — BENZONATATE 100 MG PO CAPS
100.0000 mg | ORAL_CAPSULE | Freq: Three times a day (TID) | ORAL | 0 refills | Status: DC | PRN
Start: 2020-02-11 — End: 2020-03-29

## 2020-02-11 MED ORDER — PREDNISONE 20 MG PO TABS: 40.0000 mg | ORAL_TABLET | Freq: Every day | ORAL | 0 refills | Status: AC

## 2020-02-11 MED ORDER — ALBUTEROL SULFATE (2.5 MG/3ML) 0.083% IN NEBU: 2.5000 mg | INHALATION_SOLUTION | Freq: Four times a day (QID) | RESPIRATORY_TRACT | 11 refills | Status: DC | PRN

## 2020-02-11 MED ORDER — DOXYCYCLINE HYCLATE 100 MG PO TABS
100.0000 mg | ORAL_TABLET | Freq: Two times a day (BID) | ORAL | 0 refills | Status: DC
Start: 2020-02-11 — End: 2020-03-01

## 2020-02-11 NOTE — Progress Notes (Signed)
Time spent 10 min   We are sorry that you are not feeling well.  Here is how we plan to help!  Based on your presentation I believe you most likely have A cough due to bacteria.  When patients have a fever and a productive cough with a change in color or increased sputum production, we are concerned about bacterial bronchitis.  If left untreated it can progress to pneumonia.  If your symptoms do not improve with your treatment plan it is important that you contact your provider.   I have prescribed Doxycycline 100 mg twice a day for 7 days     In addition you may use A prescription cough medication called Tessalon Perles 100mg . You may take 1-2 capsules every 8 hours as needed for your cough.  Prednisone can help with inflammation in the airways that can occur from medical conditions like asthma, COPD, smoking.  An albuterol inhaler can also help open up constricted airways that can cause cough.   From your responses in the eVisit questionnaire you describe inflammation in the upper respiratory tract which is causing a significant cough.  This is commonly called Bronchitis and has four common causes:    Allergies  Viral Infections  Acid Reflux  Bacterial Infection Allergies, viruses and acid reflux are treated by controlling symptoms or eliminating the cause. An example might be a cough caused by taking certain blood pressure medications. You stop the cough by changing the medication. Another example might be a cough caused by acid reflux. Controlling the reflux helps control the cough.  USE OF BRONCHODILATOR ("RESCUE") INHALERS: There is a risk from using your bronchodilator too frequently.  The risk is that over-reliance on a medication which only relaxes the muscles surrounding the breathing tubes can reduce the effectiveness of medications prescribed to reduce swelling and congestion of the tubes themselves.  Although you feel brief relief from the bronchodilator inhaler, your asthma may  actually be worsening with the tubes becoming more swollen and filled with mucus.  This can delay other crucial treatments, such as oral steroid medications. If you need to use a bronchodilator inhaler daily, several times per day, you should discuss this with your provider.  There are probably better treatments that could be used to keep your asthma under control.     HOME CARE . Only take medications as instructed by your medical team. . Complete the entire course of an antibiotic. . Drink plenty of fluids and get plenty of rest. . Avoid close contacts especially the very young and the elderly . Cover your mouth if you cough or cough into your sleeve. . Always remember to wash your hands . A steam or ultrasonic humidifier can help congestion.   GET HELP RIGHT AWAY IF: . You develop worsening fever. . You become short of breath . You cough up blood. . Your symptoms persist after you have completed your treatment plan MAKE SURE YOU   Understand these instructions.  Will watch your condition.  Will get help right away if you are not doing well or get worse.  Your e-visit answers were reviewed by a board certified advanced clinical practitioner to complete your personal care plan.  Depending on the condition, your plan could have included both over the counter or prescription medications. If there is a problem please reply  once you have received a response from your provider. Your safety is important to [place].  If you have drug allergies check your prescription carefully.  You can use MyChart to ask questions about today's visit, request a non-urgent call back, or ask for a work or school excuse for 24 hours related to this e-Visit. If it has been greater than 24 hours you will need to follow up with your provider, or enter a new e-Visit to address those concerns. You will get an e-mail in the next two days asking about your experience.  I hope that your e-visit has been valuable and will  speed your recovery. Thank you for using e-visits.

## 2020-02-12 ENCOUNTER — Inpatient Hospital Stay: Payer: BC Managed Care – PPO

## 2020-02-14 NOTE — Progress Notes (Deleted)
[facility]  Telephone:([phone] [phone]  ID: Carly [name] OB: [birth year]  MR#: [id number]  INO#:[id number]  Patient Care Team: McLean-Scocuzza, Nino Glow, MD as PCP - General (Internal Medicine) Lloyd Huger, MD as Consulting Physician (Oncology)   CHIEF COMPLAINT: ITP.  INTERVAL HISTORY: Patient returns to clinic today for repeat laboratory work, further evaluation, and continuation of Nplate.  She continues to feel well and remains asymptomatic. She does not complain of any easy bleeding or bruising.  She has no neurologic complaints.  She denies any recent fevers or illnesses.  She has a good appetite and denies weight loss.  She denies any chest pain, shortness of breath, cough, or hemoptysis. She denies any nausea, vomiting, constipation, or diarrhea.  She has no urinary complaints.  Patient offers no specific complaints today.  REVIEW OF SYSTEMS:   Review of Systems  Constitutional: Negative.  Negative for fever, malaise/fatigue and weight loss.  Respiratory: Negative.  Negative for cough, hemoptysis and shortness of breath.   Cardiovascular: Negative.  Negative for chest pain and leg swelling.  Gastrointestinal: Negative.  Negative for abdominal pain, blood in stool and melena.  Genitourinary: Negative.  Negative for hematuria.  Musculoskeletal: Negative.  Negative for back pain.  Skin: Negative.  Negative for rash.  Neurological: Negative.  Negative for dizziness, focal weakness, weakness and headaches.  Endo/Heme/Allergies: Negative.  Does not bruise/bleed easily.  Psychiatric/Behavioral: Negative.  The patient is not nervous/anxious.     As per HPI. Otherwise, a complete review of systems is negative.  PAST MEDICAL HISTORY: Past Medical History:  Diagnosis Date  . Anxiety   . Chicken pox   . Depression   . Elevated testosterone level in female   . Emphysema of lung (HCC)    bronchitis  . Frequent headaches   . Hay fever   .  Hypertension     PAST SURGICAL HISTORY: Past Surgical History:  Procedure Laterality Date  . HYSTEROSCOPY WITH NOVASURE N/A 02/21/2018   Procedure: HYSTEROSCOPY WITH NOVASURE, POLYPECTOMY;  Surgeon: Benjaman Kindler, MD;  Location: [facility] ORS;  Service: Gynecology;  Laterality: N/A;  . TUBAL LIGATION  1997  . TUBAL LIGATION      FAMILY HISTORY: Family History  Problem Relation Age of Onset  . Hyperlipidemia Mother   . Heart disease Mother   . Stroke Mother   . Depression Mother   . Mental illness Mother   . Heart attack Mother 69  . Hyperlipidemia Father   . Depression Father   . Mental illness Father   . Diabetes Paternal Grandfather   . Cancer Cousin     ADVANCED DIRECTIVES (Y/N):  N  HEALTH MAINTENANCE: Social History   Tobacco Use  . Smoking status: Current Every Day Smoker    Packs/day: 0.50    Years: 28.00    Pack years: 14.00    Types: Cigarettes  . Smokeless tobacco: Never Used  Vaping Use  . Vaping Use: Never used  Substance Use Topics  . Alcohol use: Yes    Alcohol/week: 8.0 standard drinks    Types: 8 Standard drinks or equivalent per week    Comment: Beer (Can)   . Drug use: No    Comment: CBD oil     Colonoscopy:  PAP:  Bone density:  Lipid panel:  Allergies  Allergen Reactions  . Meloxicam Nausea Only    Current Outpatient Medications  Medication Sig Dispense Refill  . Acetaminophen (TYLENOL EXTRA STRENGTH PO) Take by mouth.    Marland Kitchen  albuterol (PROVENTIL) (2.5 MG/3ML) 0.083% nebulizer solution Take 3 mLs (2.5 mg total) by nebulization every 6 (six) hours as needed for wheezing or shortness of breath. 360 mL 11  . ALPRAZolam (XANAX) 0.25 MG tablet Take 1 tablet (0.25 mg total) by mouth daily as needed for anxiety. 30 tablet 2  . amLODipine (NORVASC) 10 MG tablet Take 1 tablet (10 mg total) by mouth daily. 90 tablet 3  . benzonatate (TESSALON PERLES) 100 MG capsule Take 1 capsule (100 mg total) by mouth 3 (three) times daily as needed for cough.  FOR DISRUPTIVE DAY TIME COUGH 20 capsule 0  . budesonide-formoterol (SYMBICORT) 160-4.5 MCG/ACT inhaler Inhale 2 puffs into the lungs 2 (two) times daily. Rinse mouth 1 Inhaler 11  . carvedilol (COREG) 25 MG tablet Take 1 tablet (25 mg total) by mouth 2 (two) times daily with a meal. 180 tablet 3  . cetirizine (ZYRTEC) 10 MG tablet Take 1 tablet (10 mg total) by mouth daily. 90 tablet 3  . Cholecalciferol 1.25 MG (50000 UT) capsule Take 1 capsule (50,000 Units total) by mouth every 30 (thirty) days. 1x per month note this 13 capsule 0  . cyanocobalamin (,VITAMIN B-12,) 1000 MCG/ML injection 1 mL 1x per week x 4 weeks then every 30 days 8 mL 3  . diclofenac Sodium (VOLTAREN) 1 % GEL Apply 2-4 g topically 4 (four) times daily. 2 grams upper body qid prn and 4 gram lower body qid prn 150 g 11  . dicyclomine (BENTYL) 10 MG capsule Take 1 capsule (10 mg total) by mouth 3 (three) times daily as needed for spasms. Before food 90 capsule 2  . doxycycline (VIBRA-TABS) 100 MG tablet Take 1 tablet (100 mg total) by mouth 2 (two) times daily. 20 tablet 0  . escitalopram (LEXAPRO) 20 MG tablet Take 1 tablet (20 mg total) by mouth daily. 90 tablet 3  . fluticasone (FLONASE) 50 MCG/ACT nasal spray Place 2 sprays into both nostrils daily. 16 g 11  . hydrocortisone (ANUSOL-HC) 25 MG suppository Place 1 suppository (25 mg total) rectally 2 (two) times daily. 12 suppository 0  . mirabegron ER (MYRBETRIQ) 25 MG TB24 tablet Take 1 tablet (25 mg total) by mouth daily. 90 tablet 3  . nicotine polacrilex (NICORETTE) 4 MG gum Take 1 each (4 mg total) by mouth as needed for smoking cessation. Chew 1 piece every 1-2 hours max 24 pieces per day x 1-6 weeks then week 7-9 every 2-4 hours and week 10-12 every 4-8 hours 300 tablet 5  . ondansetron (ZOFRAN) 4 MG tablet Take 1 tablet (4 mg total) by mouth every 8 (eight) hours as needed for nausea or vomiting. 40 tablet 2  . predniSONE (DELTASONE) 20 MG tablet Take 2 tablets (40 mg  total) by mouth daily with breakfast for 5 days. 10 tablet 0  . spironolactone (ALDACTONE) 25 MG tablet Take 1 tablet (25 mg total) by mouth daily. In am 90 tablet 3  . Syringe/Needle, Disp, 25G X 1-1/2" 1 ML MISC 1 Device by Does not apply route as directed. 30 each 11  . traZODone (DESYREL) 50 MG tablet Take 0.5-1 tablets (25-50 mg total) by mouth at bedtime as needed for sleep. 90 tablet 3   No current facility-administered medications for this visit.    OBJECTIVE: There were no vitals filed for this visit.   There is no height or weight on file to calculate BMI.    ECOG FS:0 - Asymptomatic  General: Well-developed, well-nourished, no acute  distress. Eyes: Pink conjunctiva, anicteric sclera. HEENT: Normocephalic, moist mucous membranes. Lungs: No audible wheezing or coughing. Heart: Regular rate and rhythm. Abdomen: Soft, nontender, no obvious distention. Musculoskeletal: No edema, cyanosis, or clubbing. Neuro: Alert, answering all questions appropriately. Cranial nerves grossly intact. Skin: No rashes or petechiae noted. Psych: Normal affect.   LAB RESULTS:  Lab Results  Component Value Date   NA 141 06/10/2019   K 3.6 06/10/2019   CL 104 06/10/2019   CO2 27 06/10/2019   GLUCOSE 95 06/10/2019   BUN 4 (L) 06/10/2019   CREATININE 0.60 06/10/2019   CALCIUM 8.9 06/10/2019   PROT 6.4 06/10/2019   ALBUMIN 3.8 06/10/2019   AST 34 06/10/2019   ALT 21 06/10/2019   ALKPHOS 73 06/10/2019   BILITOT 0.7 06/10/2019   GFRNONAA >60 12/08/2018   GFRAA >60 12/08/2018    Lab Results  Component Value Date   WBC 9.0 02/08/2020   NEUTROABS 5.2 02/08/2020   HGB 15.1 (H) 02/08/2020   HCT 43.8 02/08/2020   MCV 96.1 02/08/2020   PLT 116 (L) 02/08/2020     STUDIES: No results found.  ASSESSMENT: ITP  PLAN:    1.  ITP: Confirmed with normal bone marrow biopsy and normal FISH and cytogenetics on July 22, 2019.  Patient has a positive ANA which is likely clinically  insignificant.  The remainder of her laboratory work is either negative or within normal limits. CT scan on March 04, 2017 did not reveal splenomegaly.  Despite 1 week of prednisone at 1 mg/kg dose, patient had no appreciable change in her platelet count.  Rituxan was denied by insurance.  Patient has had weekly Nplate 1 mcg/kg with mild improvement of her platelet count to approximately 100.  Today's result is 92.  Will increase dose to 2 mcg/kg.  Hold treatment if platelet count increases above 200.  Return to clinic weekly for laboratory work and Nplate if needed.  Patient will then return to clinic in 4 weeks for further evaluation and continuation of treatment if necessary. 2.  Positive ANA: Likely clinically insignificant.  Referral was previously sent to rheumatology.  I spent a total of 30 minutes reviewing chart data, face-to-face evaluation with the patient, counseling and coordination of care as detailed above.   Patient expressed understanding and was in agreement with this plan. She also understands that She can call clinic at any time with any questions, concerns, or complaints.    Lloyd Huger, MD   02/14/2020 9:15 AM

## 2020-02-15 ENCOUNTER — Inpatient Hospital Stay: Payer: BC Managed Care – PPO | Admitting: Oncology

## 2020-02-15 ENCOUNTER — Inpatient Hospital Stay: Payer: BC Managed Care – PPO

## 2020-02-15 DIAGNOSIS — J441 Chronic obstructive pulmonary disease with (acute) exacerbation: Secondary | ICD-10-CM | POA: Diagnosis not present

## 2020-02-17 ENCOUNTER — Ambulatory Visit: Payer: BC Managed Care – PPO | Admitting: Psychology

## 2020-02-17 DIAGNOSIS — R7402 Elevation of levels of lactic acid dehydrogenase (LDH): Secondary | ICD-10-CM | POA: Diagnosis not present

## 2020-02-17 DIAGNOSIS — M791 Myalgia, unspecified site: Secondary | ICD-10-CM | POA: Diagnosis not present

## 2020-02-17 DIAGNOSIS — B348 Other viral infections of unspecified site: Secondary | ICD-10-CM | POA: Diagnosis not present

## 2020-02-17 DIAGNOSIS — J441 Chronic obstructive pulmonary disease with (acute) exacerbation: Secondary | ICD-10-CM | POA: Diagnosis not present

## 2020-02-17 DIAGNOSIS — I10 Essential (primary) hypertension: Secondary | ICD-10-CM | POA: Diagnosis not present

## 2020-02-17 DIAGNOSIS — R0602 Shortness of breath: Secondary | ICD-10-CM | POA: Diagnosis not present

## 2020-02-17 DIAGNOSIS — Z7951 Long term (current) use of inhaled steroids: Secondary | ICD-10-CM | POA: Diagnosis not present

## 2020-02-17 DIAGNOSIS — E872 Acidosis: Secondary | ICD-10-CM | POA: Diagnosis not present

## 2020-02-17 DIAGNOSIS — T486X5A Adverse effect of antiasthmatics, initial encounter: Secondary | ICD-10-CM | POA: Diagnosis not present

## 2020-02-17 DIAGNOSIS — Z79899 Other long term (current) drug therapy: Secondary | ICD-10-CM | POA: Diagnosis not present

## 2020-02-17 DIAGNOSIS — B9789 Other viral agents as the cause of diseases classified elsewhere: Secondary | ICD-10-CM | POA: Diagnosis not present

## 2020-02-17 DIAGNOSIS — F419 Anxiety disorder, unspecified: Secondary | ICD-10-CM | POA: Diagnosis not present

## 2020-02-17 DIAGNOSIS — J45901 Unspecified asthma with (acute) exacerbation: Secondary | ICD-10-CM | POA: Diagnosis not present

## 2020-02-17 DIAGNOSIS — R0603 Acute respiratory distress: Secondary | ICD-10-CM | POA: Diagnosis not present

## 2020-02-17 DIAGNOSIS — R000 Tachycardia, unspecified: Secondary | ICD-10-CM | POA: Diagnosis not present

## 2020-02-17 DIAGNOSIS — Z720 Tobacco use: Secondary | ICD-10-CM | POA: Diagnosis not present

## 2020-02-17 DIAGNOSIS — J45909 Unspecified asthma, uncomplicated: Secondary | ICD-10-CM | POA: Diagnosis not present

## 2020-02-17 DIAGNOSIS — R918 Other nonspecific abnormal finding of lung field: Secondary | ICD-10-CM | POA: Diagnosis not present

## 2020-02-17 DIAGNOSIS — Z20822 Contact with and (suspected) exposure to covid-19: Secondary | ICD-10-CM | POA: Diagnosis not present

## 2020-02-17 DIAGNOSIS — E876 Hypokalemia: Secondary | ICD-10-CM | POA: Diagnosis not present

## 2020-02-17 DIAGNOSIS — F17210 Nicotine dependence, cigarettes, uncomplicated: Secondary | ICD-10-CM | POA: Diagnosis not present

## 2020-02-17 DIAGNOSIS — D693 Immune thrombocytopenic purpura: Secondary | ICD-10-CM | POA: Diagnosis not present

## 2020-02-17 DIAGNOSIS — E873 Alkalosis: Secondary | ICD-10-CM | POA: Diagnosis not present

## 2020-02-19 ENCOUNTER — Telehealth: Payer: Self-pay | Admitting: Oncology

## 2020-02-19 NOTE — Telephone Encounter (Signed)
Called and spoke to pt per Woodfin Ganja. Was advised to get shot at [facility] and See Woodfin Ganja 1 week from scheduled 8/12. Pt rescheduled to 8/19.

## 2020-02-19 NOTE — Progress Notes (Deleted)
[name]  Telephone:(336) [phone] Fax:(336) [phone]  ID: Carly [name] OB: 26-Jan-1971  MR#: [id number]  TGY#:[id number]  Patient Care Team: McLean-Scocuzza, Nino Glow, MD as PCP - General (Internal Medicine) Lloyd Huger, MD as Consulting Physician (Oncology)   CHIEF COMPLAINT: ITP.  INTERVAL HISTORY: Patient returns to clinic today for repeat laboratory work, further evaluation, and continuation of Nplate.  She continues to feel well and remains asymptomatic. She does not complain of any easy bleeding or bruising.  She has no neurologic complaints.  She denies any recent fevers or illnesses.  She has a good appetite and denies weight loss.  She denies any chest pain, shortness of breath, cough, or hemoptysis. She denies any nausea, vomiting, constipation, or diarrhea.  She has no urinary complaints.  Patient offers no specific complaints today.  REVIEW OF SYSTEMS:   Review of Systems  Constitutional: Negative.  Negative for fever, malaise/fatigue and weight loss.  Respiratory: Negative.  Negative for cough, hemoptysis and shortness of breath.   Cardiovascular: Negative.  Negative for chest pain and leg swelling.  Gastrointestinal: Negative.  Negative for abdominal pain, blood in stool and melena.  Genitourinary: Negative.  Negative for hematuria.  Musculoskeletal: Negative.  Negative for back pain.  Skin: Negative.  Negative for rash.  Neurological: Negative.  Negative for dizziness, focal weakness, weakness and headaches.  Endo/Heme/Allergies: Negative.  Does not bruise/bleed easily.  Psychiatric/Behavioral: Negative.  The patient is not nervous/anxious.     As per HPI. Otherwise, a complete review of systems is negative.  PAST MEDICAL HISTORY: Past Medical History:  Diagnosis Date  . Anxiety   . Chicken pox   . Depression   . Elevated testosterone level in female   . Emphysema of lung (HCC)    bronchitis  . Frequent headaches   . Hay fever   .  Hypertension     PAST SURGICAL HISTORY: Past Surgical History:  Procedure Laterality Date  . HYSTEROSCOPY WITH NOVASURE N/A 02/21/2018   Procedure: HYSTEROSCOPY WITH NOVASURE, POLYPECTOMY;  Surgeon: Benjaman Kindler, MD;  Location: [facility] ORS;  Service: Gynecology;  Laterality: N/A;  . TUBAL LIGATION  1997  . TUBAL LIGATION      FAMILY HISTORY: Family History  Problem Relation Age of Onset  . Hyperlipidemia Mother   . Heart disease Mother   . Stroke Mother   . Depression Mother   . Mental illness Mother   . Heart attack Mother 33  . Hyperlipidemia Father   . Depression Father   . Mental illness Father   . Diabetes Paternal Grandfather   . Cancer Cousin     ADVANCED DIRECTIVES (Y/N):  N  HEALTH MAINTENANCE: Social History   Tobacco Use  . Smoking status: Current Every Day Smoker    Packs/day: 0.50    Years: 28.00    Pack years: 14.00    Types: Cigarettes  . Smokeless tobacco: Never Used  Vaping Use  . Vaping Use: Never used  Substance Use Topics  . Alcohol use: Yes    Alcohol/week: 8.0 standard drinks    Types: 8 Standard drinks or equivalent per week    Comment: Beer (Can)   . Drug use: No    Comment: CBD oil     Colonoscopy:  PAP:  Bone density:  Lipid panel:  Allergies  Allergen Reactions  . Meloxicam Nausea Only    Current Outpatient Medications  Medication Sig Dispense Refill  . Acetaminophen (TYLENOL EXTRA STRENGTH PO) Take by mouth.    Marland Kitchen  albuterol (PROVENTIL) (2.5 MG/3ML) 0.083% nebulizer solution Take 3 mLs (2.5 mg total) by nebulization every 6 (six) hours as needed for wheezing or shortness of breath. 360 mL 11  . ALPRAZolam (XANAX) 0.25 MG tablet Take 1 tablet (0.25 mg total) by mouth daily as needed for anxiety. 30 tablet 2  . amLODipine (NORVASC) 10 MG tablet Take 1 tablet (10 mg total) by mouth daily. 90 tablet 3  . benzonatate (TESSALON PERLES) 100 MG capsule Take 1 capsule (100 mg total) by mouth 3 (three) times daily as needed for cough.  FOR DISRUPTIVE DAY TIME COUGH 20 capsule 0  . budesonide-formoterol (SYMBICORT) 160-4.5 MCG/ACT inhaler Inhale 2 puffs into the lungs 2 (two) times daily. Rinse mouth 1 Inhaler 11  . carvedilol (COREG) 25 MG tablet Take 1 tablet (25 mg total) by mouth 2 (two) times daily with a meal. 180 tablet 3  . cetirizine (ZYRTEC) 10 MG tablet Take 1 tablet (10 mg total) by mouth daily. 90 tablet 3  . Cholecalciferol 1.25 MG (50000 UT) capsule Take 1 capsule (50,000 Units total) by mouth every 30 (thirty) days. 1x per month note this 13 capsule 0  . cyanocobalamin (,VITAMIN B-12,) 1000 MCG/ML injection 1 mL 1x per week x 4 weeks then every 30 days 8 mL 3  . diclofenac Sodium (VOLTAREN) 1 % GEL Apply 2-4 g topically 4 (four) times daily. 2 grams upper body qid prn and 4 gram lower body qid prn 150 g 11  . dicyclomine (BENTYL) 10 MG capsule Take 1 capsule (10 mg total) by mouth 3 (three) times daily as needed for spasms. Before food 90 capsule 2  . doxycycline (VIBRA-TABS) 100 MG tablet Take 1 tablet (100 mg total) by mouth 2 (two) times daily. 20 tablet 0  . escitalopram (LEXAPRO) 20 MG tablet Take 1 tablet (20 mg total) by mouth daily. 90 tablet 3  . fluticasone (FLONASE) 50 MCG/ACT nasal spray Place 2 sprays into both nostrils daily. 16 g 11  . hydrocortisone (ANUSOL-HC) 25 MG suppository Place 1 suppository (25 mg total) rectally 2 (two) times daily. 12 suppository 0  . mirabegron ER (MYRBETRIQ) 25 MG TB24 tablet Take 1 tablet (25 mg total) by mouth daily. 90 tablet 3  . nicotine polacrilex (NICORETTE) 4 MG gum Take 1 each (4 mg total) by mouth as needed for smoking cessation. Chew 1 piece every 1-2 hours max 24 pieces per day x 1-6 weeks then week 7-9 every 2-4 hours and week 10-12 every 4-8 hours 300 tablet 5  . ondansetron (ZOFRAN) 4 MG tablet Take 1 tablet (4 mg total) by mouth every 8 (eight) hours as needed for nausea or vomiting. 40 tablet 2  . spironolactone (ALDACTONE) 25 MG tablet Take 1 tablet (25 mg  total) by mouth daily. In am 90 tablet 3  . Syringe/Needle, Disp, 25G X 1-1/2" 1 ML MISC 1 Device by Does not apply route as directed. 30 each 11  . traZODone (DESYREL) 50 MG tablet Take 0.5-1 tablets (25-50 mg total) by mouth at bedtime as needed for sleep. 90 tablet 3   No current facility-administered medications for this visit.    OBJECTIVE: There were no vitals filed for this visit.   There is no height or weight on file to calculate BMI.    ECOG FS:0 - Asymptomatic  General: Well-developed, well-nourished, no acute distress. Eyes: Pink conjunctiva, anicteric sclera. HEENT: Normocephalic, moist mucous membranes. Lungs: No audible wheezing or coughing. Heart: Regular rate and rhythm. Abdomen: Soft,  nontender, no obvious distention. Musculoskeletal: No edema, cyanosis, or clubbing. Neuro: Alert, answering all questions appropriately. Cranial nerves grossly intact. Skin: No rashes or petechiae noted. Psych: Normal affect.   LAB RESULTS:  Lab Results  Component Value Date   NA 141 06/10/2019   K 3.6 06/10/2019   CL 104 06/10/2019   CO2 27 06/10/2019   GLUCOSE 95 06/10/2019   BUN 4 (L) 06/10/2019   CREATININE 0.60 06/10/2019   CALCIUM 8.9 06/10/2019   PROT 6.4 06/10/2019   ALBUMIN 3.8 06/10/2019   AST 34 06/10/2019   ALT 21 06/10/2019   ALKPHOS 73 06/10/2019   BILITOT 0.7 06/10/2019   GFRNONAA >60 12/08/2018   GFRAA >60 12/08/2018    Lab Results  Component Value Date   WBC 9.0 02/08/2020   NEUTROABS 5.2 02/08/2020   HGB 15.1 (H) 02/08/2020   HCT 43.8 02/08/2020   MCV 96.1 02/08/2020   PLT 116 (L) 02/08/2020     STUDIES: No results found.  ASSESSMENT: ITP  PLAN:    1.  ITP: Confirmed with normal bone marrow biopsy and normal FISH and cytogenetics on July 22, 2019.  Patient has a positive ANA which is likely clinically insignificant.  The remainder of her laboratory work is either negative or within normal limits. CT scan on March 04, 2017 did not reveal  splenomegaly.  Despite 1 week of prednisone at 1 mg/kg dose, patient had no appreciable change in her platelet count.  Rituxan was denied by insurance.  Patient has had weekly Nplate 1 mcg/kg with mild improvement of her platelet count to approximately 100.  Today's result is 92.  Will increase dose to 2 mcg/kg.  Hold treatment if platelet count increases above 200.  Return to clinic weekly for laboratory work and Nplate if needed.  Patient will then return to clinic in 4 weeks for further evaluation and continuation of treatment if necessary. 2.  Positive ANA: Likely clinically insignificant.  Referral was previously sent to rheumatology.  I spent a total of 30 minutes reviewing chart data, face-to-face evaluation with the patient, counseling and coordination of care as detailed above.   Patient expressed understanding and was in agreement with this plan. She also understands that She can call clinic at any time with any questions, concerns, or complaints.    Lloyd Huger, MD   02/19/2020 6:51 AM

## 2020-02-22 ENCOUNTER — Telehealth: Payer: Self-pay | Admitting: Internal Medicine

## 2020-02-22 NOTE — Telephone Encounter (Signed)
Pt called and wanted to know if something could be called in for a yeast infection

## 2020-02-23 ENCOUNTER — Other Ambulatory Visit: Payer: Self-pay | Admitting: Internal Medicine

## 2020-02-23 DIAGNOSIS — B3731 Acute candidiasis of vulva and vagina: Secondary | ICD-10-CM

## 2020-02-23 MED ORDER — FLUCONAZOLE 150 MG PO TABS: 150.0000 mg | ORAL_TABLET | Freq: Once | ORAL | 0 refills | Status: AC

## 2020-02-23 NOTE — Telephone Encounter (Signed)
Patient informed and verbalized understanding

## 2020-02-23 NOTE — Telephone Encounter (Signed)
Advise diflucan sent in we charge my chart fees of telephone consults in future for medications outside of appt

## 2020-02-25 ENCOUNTER — Inpatient Hospital Stay: Payer: BC Managed Care – PPO

## 2020-02-25 ENCOUNTER — Inpatient Hospital Stay: Payer: BC Managed Care – PPO | Admitting: Oncology

## 2020-02-26 ENCOUNTER — Encounter: Payer: Self-pay | Admitting: Internal Medicine

## 2020-02-26 ENCOUNTER — Telehealth (INDEPENDENT_AMBULATORY_CARE_PROVIDER_SITE_OTHER): Payer: BC Managed Care – PPO | Admitting: Internal Medicine

## 2020-02-26 VITALS — BP 145/89 | Ht 63.0 in | Wt 186.0 lb

## 2020-02-26 DIAGNOSIS — D696 Thrombocytopenia, unspecified: Secondary | ICD-10-CM | POA: Diagnosis not present

## 2020-02-26 DIAGNOSIS — B348 Other viral infections of unspecified site: Secondary | ICD-10-CM

## 2020-02-26 DIAGNOSIS — J45901 Unspecified asthma with (acute) exacerbation: Secondary | ICD-10-CM

## 2020-02-26 DIAGNOSIS — Z720 Tobacco use: Secondary | ICD-10-CM

## 2020-02-26 DIAGNOSIS — J441 Chronic obstructive pulmonary disease with (acute) exacerbation: Secondary | ICD-10-CM

## 2020-02-26 MED ORDER — SPIRIVA RESPIMAT 1.25 MCG/ACT IN AERS: 2.0000 | INHALATION_SPRAY | Freq: Every day | RESPIRATORY_TRACT | 11 refills | Status: DC

## 2020-02-26 NOTE — Progress Notes (Signed)
Patient was having difficulty breathing and was admitted to [facility].

## 2020-02-26 NOTE — Progress Notes (Signed)
[place]  [phone]) [phone] [phone]) [phone]  ID: Carly [name] OB: 12-29-1970  MR#: [id number]  DXA#:[id number]  Patient Care Team: McLean-Scocuzza, Nino Glow, MD as PCP - General (Internal Medicine) Lloyd Huger, MD as Consulting Physician (Oncology)   CHIEF COMPLAINT: ITP.  INTERVAL HISTORY: Patient returns to clinic today for repeat laboratory work, further evaluation, and continuation of Nplate.  She has increased weakness and fatigue, but attributes this to her recent admission to [facility].  She otherwise feels well.  She does not complain of any easy bleeding or bruising.  She has no neurologic complaints.  She denies any recent fevers or illnesses.  She has a good appetite and denies weight loss.  She denies any chest pain, shortness of breath, cough, or hemoptysis. She denies any nausea, vomiting, constipation, or diarrhea.  She has no urinary complaints.  Patient offers no further specific complaints today.  REVIEW OF SYSTEMS:   Review of Systems  Constitutional: Positive for malaise/fatigue. Negative for fever and weight loss.  Respiratory: Negative.  Negative for cough, hemoptysis and shortness of breath.   Cardiovascular: Negative.  Negative for chest pain and leg swelling.  Gastrointestinal: Negative.  Negative for abdominal pain, blood in stool and melena.  Genitourinary: Negative.  Negative for hematuria.  Musculoskeletal: Negative.  Negative for back pain.  Skin: Negative.  Negative for rash.  Neurological: Positive for weakness. Negative for dizziness, focal weakness and headaches.  Endo/Heme/Allergies: Negative.  Does not bruise/bleed easily.  Psychiatric/Behavioral: Negative.  The patient is not nervous/anxious.     As per HPI. Otherwise, a complete review of systems is negative.  PAST MEDICAL HISTORY: Past Medical History:  Diagnosis Date  . Anxiety   . Chicken pox   . Depression   . Elevated testosterone level in female   .  Emphysema of lung (HCC)    bronchitis  . Frequent headaches   . Hay fever   . Hypertension     PAST SURGICAL HISTORY: Past Surgical History:  Procedure Laterality Date  . HYSTEROSCOPY WITH NOVASURE N/A 02/21/2018   Procedure: HYSTEROSCOPY WITH NOVASURE, POLYPECTOMY;  Surgeon: Benjaman Kindler, MD;  Location: [facility] ORS;  Service: Gynecology;  Laterality: N/A;  . TUBAL LIGATION  1997  . TUBAL LIGATION      FAMILY HISTORY: Family History  Problem Relation Age of Onset  . Hyperlipidemia Mother   . Heart disease Mother   . Stroke Mother   . Depression Mother   . Mental illness Mother   . Heart attack Mother 21  . Hyperlipidemia Father   . Depression Father   . Mental illness Father   . Diabetes Paternal Grandfather   . Cancer Cousin     ADVANCED DIRECTIVES (Y/N):  N  HEALTH MAINTENANCE: Social History   Tobacco Use  . Smoking status: Current Every Day Smoker    Packs/day: 0.50    Years: 28.00    Pack years: 14.00    Types: Cigarettes  . Smokeless tobacco: Never Used  Vaping Use  . Vaping Use: Never used  Substance Use Topics  . Alcohol use: Yes    Alcohol/week: 8.0 standard drinks    Types: 8 Standard drinks or equivalent per week    Comment: Beer (Can)   . Drug use: No    Comment: CBD oil     Colonoscopy:  PAP:  Bone density:  Lipid panel:  Allergies  Allergen Reactions  . Meloxicam Nausea Only    Current Outpatient Medications  Medication Sig  Dispense Refill  . Acetaminophen (TYLENOL EXTRA STRENGTH PO) Take by mouth.    Marland Kitchen albuterol (PROVENTIL) (2.5 MG/3ML) 0.083% nebulizer solution Take 3 mLs (2.5 mg total) by nebulization every 6 (six) hours as needed for wheezing or shortness of breath. 360 mL 11  . albuterol (VENTOLIN HFA) 108 (90 Base) MCG/ACT inhaler Inhale into the lungs every 6 (six) hours as needed for wheezing or shortness of breath.    . ALPRAZolam (XANAX) 0.25 MG tablet Take 1 tablet (0.25 mg total) by mouth daily as needed for anxiety. 30  tablet 2  . amLODipine (NORVASC) 10 MG tablet Take 1 tablet (10 mg total) by mouth daily. 90 tablet 3  . benzonatate (TESSALON PERLES) 100 MG capsule Take 1 capsule (100 mg total) by mouth 3 (three) times daily as needed for cough. FOR DISRUPTIVE DAY TIME COUGH 20 capsule 0  . budesonide-formoterol (SYMBICORT) 160-4.5 MCG/ACT inhaler Inhale 2 puffs into the lungs 2 (two) times daily. Rinse mouth 1 Inhaler 11  . carvedilol (COREG) 25 MG tablet Take 1 tablet (25 mg total) by mouth 2 (two) times daily with a meal. 180 tablet 3  . cetirizine (ZYRTEC) 10 MG tablet Take 1 tablet (10 mg total) by mouth daily. 90 tablet 3  . Cholecalciferol 1.25 MG (50000 UT) capsule Take 1 capsule (50,000 Units total) by mouth every 30 (thirty) days. 1x per month note this 13 capsule 0  . cyanocobalamin (,VITAMIN B-12,) 1000 MCG/ML injection 1 mL 1x per week x 4 weeks then every 30 days 8 mL 3  . diclofenac Sodium (VOLTAREN) 1 % GEL Apply 2-4 g topically 4 (four) times daily. 2 grams upper body qid prn and 4 gram lower body qid prn 150 g 11  . dicyclomine (BENTYL) 10 MG capsule Take 1 capsule (10 mg total) by mouth 3 (three) times daily as needed for spasms. Before food 90 capsule 2  . escitalopram (LEXAPRO) 20 MG tablet Take 1 tablet (20 mg total) by mouth daily. 90 tablet 3  . fluticasone (FLONASE) 50 MCG/ACT nasal spray Place 2 sprays into both nostrils daily. 16 g 11  . mirabegron ER (MYRBETRIQ) 25 MG TB24 tablet Take 1 tablet (25 mg total) by mouth daily. 90 tablet 3  . nicotine polacrilex (NICORETTE) 4 MG gum Take 1 each (4 mg total) by mouth as needed for smoking cessation. Chew 1 piece every 1-2 hours max 24 pieces per day x 1-6 weeks then week 7-9 every 2-4 hours and week 10-12 every 4-8 hours 300 tablet 5  . ondansetron (ZOFRAN) 4 MG tablet Take 1 tablet (4 mg total) by mouth every 8 (eight) hours as needed for nausea or vomiting. 40 tablet 2  . spironolactone (ALDACTONE) 25 MG tablet Take 1 tablet (25 mg total) by  mouth daily. In am 90 tablet 3  . Syringe/Needle, Disp, 25G X 1-1/2" 1 ML MISC 1 Device by Does not apply route as directed. 30 each 11  . Tiotropium Bromide Monohydrate (SPIRIVA RESPIMAT) 1.25 MCG/ACT AERS Inhale 2 puffs into the lungs daily. 4 g 11  . traZODone (DESYREL) 50 MG tablet Take 0.5-1 tablets (25-50 mg total) by mouth at bedtime as needed for sleep. 90 tablet 3   No current facility-administered medications for this visit.    OBJECTIVE: Vitals:   03/02/20 1009  BP: (!) 144/92  Pulse: 87  Resp: 16  Temp: 98 F (36.7 C)  SpO2: 99%     Body mass index is 32.91 kg/m.  ECOG FS:0 - Asymptomatic  General: Well-developed, well-nourished, no acute distress. Eyes: Pink conjunctiva, anicteric sclera. HEENT: Normocephalic, moist mucous membranes. Lungs: No audible wheezing or coughing. Heart: Regular rate and rhythm. Abdomen: Soft, nontender, no obvious distention. Musculoskeletal: No edema, cyanosis, or clubbing. Neuro: Alert, answering all questions appropriately. Cranial nerves grossly intact. Skin: No rashes or petechiae noted. Psych: Normal affect.  LAB RESULTS:  Lab Results  Component Value Date   NA 141 06/10/2019   K 3.6 06/10/2019   CL 104 06/10/2019   CO2 27 06/10/2019   GLUCOSE 95 06/10/2019   BUN 4 (L) 06/10/2019   CREATININE 0.60 06/10/2019   CALCIUM 8.9 06/10/2019   PROT 6.4 06/10/2019   ALBUMIN 3.8 06/10/2019   AST 34 06/10/2019   ALT 21 06/10/2019   ALKPHOS 73 06/10/2019   BILITOT 0.7 06/10/2019   GFRNONAA >60 12/08/2018   GFRAA >60 12/08/2018    Lab Results  Component Value Date   WBC 10.8 (H) 03/02/2020   NEUTROABS 6.5 03/02/2020   HGB 15.4 (H) 03/02/2020   HCT 45.5 03/02/2020   MCV 98.9 03/02/2020   PLT 147 (L) 03/02/2020     STUDIES: No results found.  ASSESSMENT: ITP  PLAN:    1.  ITP: Confirmed with normal bone marrow biopsy and normal FISH and cytogenetics on July 22, 2019.  Patient has a positive ANA which is likely  clinically insignificant.  The remainder of her laboratory work was either negative or within normal limits. CT scan on March 04, 2017 did not reveal splenomegaly.  Despite 1 week of prednisone at 1 mg/kg dose, patient had no appreciable change in her platelet count.  Rituxan was denied by insurance.  Patient received her most recent injection of Nplate at [facility] on February 19, 2020.  Hold treatment today.  Patient return to clinic on March 18, 2020 or 4 weeks after her last injection for further evaluation and continuation of Nplate if needed.  Patient's current dose is 2 mcg/kg.   2.  Positive ANA: Likely clinically insignificant.  Referral was previously sent to rheumatology.  I spent a total of 30 minutes reviewing chart data, face-to-face evaluation with the patient, counseling and coordination of care as detailed above.   Patient expressed understanding and was in agreement with this plan. She also understands that She can call clinic at any time with any questions, concerns, or complaints.    Lloyd Huger, MD   03/02/2020 11:11 AM

## 2020-02-26 NOTE — Progress Notes (Signed)
Virtual Visit via Video Note  I connected with Carly [name]  on 02/26/20 at  8:15 AM EDT by a video enabled telemedicine application and verified that I am speaking with the correct person using two identifiers.  Location patient: home Location provider:work or home office Persons participating in the virtual visit: patient, provider  I discussed the limitations of evaluation and management by telemedicine and the availability of in person appointments. The patient expressed understanding and agreed to proceed.   HPI: 02/17/20 seen [facility] for COPD/asthma exacerbation 2/2 parainfluenza 3 still feeling tired and weak but at work today. Symbicort, neb albuterol inhalers not working alone. She was given rocephin, pulmcort neb zpack in the ED [facility] and on steroids currently until tomorrow  She has cut back on smoking and smoking less   HTN BP elevated today on norvasc 10. Coreg 25 bid and spironolactone 25 mg qd    plts improved to 82K f/u Dr. Grayland Ormond and needs FMLA due to work will fill out for PCP but asking h/o and lung to given statements as well for intermittently leave  ROS: See pertinent positives and negatives per HPI.  Past Medical History:  Diagnosis Date  . Anxiety   . Chicken pox   . Depression   . Elevated testosterone level in female   . Emphysema of lung (HCC)    bronchitis  . Frequent headaches   . Hay fever   . Hypertension     Past Surgical History:  Procedure Laterality Date  . HYSTEROSCOPY WITH NOVASURE N/A 02/21/2018   Procedure: HYSTEROSCOPY WITH NOVASURE, POLYPECTOMY;  Surgeon: Benjaman Kindler, MD;  Location: [facility] ORS;  Service: Gynecology;  Laterality: N/A;  . TUBAL LIGATION  1997  . TUBAL LIGATION      Family History  Problem Relation Age of Onset  . Hyperlipidemia Mother   . Heart disease Mother   . Stroke Mother   . Depression Mother   . Mental illness Mother   . Heart attack Mother 42  . Hyperlipidemia Father   . Depression Father   . Mental  illness Father   . Diabetes Paternal Grandfather   . Cancer Cousin     SOCIAL HX: married   Current Outpatient Medications:  .  Acetaminophen (TYLENOL EXTRA STRENGTH PO), Take by mouth., Disp: , Rfl:  .  albuterol (PROVENTIL) (2.5 MG/3ML) 0.083% nebulizer solution, Take 3 mLs (2.5 mg total) by nebulization every 6 (six) hours as needed for wheezing or shortness of breath., Disp: 360 mL, Rfl: 11 .  albuterol (VENTOLIN HFA) 108 (90 Base) MCG/ACT inhaler, Inhale into the lungs every 6 (six) hours as needed for wheezing or shortness of breath., Disp: , Rfl:  .  ALPRAZolam (XANAX) 0.25 MG tablet, Take 1 tablet (0.25 mg total) by mouth daily as needed for anxiety., Disp: 30 tablet, Rfl: 2 .  amLODipine (NORVASC) 10 MG tablet, Take 1 tablet (10 mg total) by mouth daily., Disp: 90 tablet, Rfl: 3 .  benzonatate (TESSALON PERLES) 100 MG capsule, Take 1 capsule (100 mg total) by mouth 3 (three) times daily as needed for cough. FOR DISRUPTIVE DAY TIME COUGH, Disp: 20 capsule, Rfl: 0 .  budesonide-formoterol (SYMBICORT) 160-4.5 MCG/ACT inhaler, Inhale 2 puffs into the lungs 2 (two) times daily. Rinse mouth, Disp: 1 Inhaler, Rfl: 11 .  carvedilol (COREG) 25 MG tablet, Take 1 tablet (25 mg total) by mouth 2 (two) times daily with a meal., Disp: 180 tablet, Rfl: 3 .  cetirizine (ZYRTEC) 10 MG tablet, Take  1 tablet (10 mg total) by mouth daily., Disp: 90 tablet, Rfl: 3 .  Cholecalciferol 1.25 MG (50000 UT) capsule, Take 1 capsule (50,000 Units total) by mouth every 30 (thirty) days. 1x per month note this, Disp: 13 capsule, Rfl: 0 .  cyanocobalamin (,VITAMIN B-12,) 1000 MCG/ML injection, 1 mL 1x per week x 4 weeks then every 30 days, Disp: 8 mL, Rfl: 3 .  diclofenac Sodium (VOLTAREN) 1 % GEL, Apply 2-4 g topically 4 (four) times daily. 2 grams upper body qid prn and 4 gram lower body qid prn, Disp: 150 g, Rfl: 11 .  dicyclomine (BENTYL) 10 MG capsule, Take 1 capsule (10 mg total) by mouth 3 (three) times daily  as needed for spasms. Before food, Disp: 90 capsule, Rfl: 2 .  escitalopram (LEXAPRO) 20 MG tablet, Take 1 tablet (20 mg total) by mouth daily., Disp: 90 tablet, Rfl: 3 .  fluticasone (FLONASE) 50 MCG/ACT nasal spray, Place 2 sprays into both nostrils daily., Disp: 16 g, Rfl: 11 .  mirabegron ER (MYRBETRIQ) 25 MG TB24 tablet, Take 1 tablet (25 mg total) by mouth daily., Disp: 90 tablet, Rfl: 3 .  nicotine polacrilex (NICORETTE) 4 MG gum, Take 1 each (4 mg total) by mouth as needed for smoking cessation. Chew 1 piece every 1-2 hours max 24 pieces per day x 1-6 weeks then week 7-9 every 2-4 hours and week 10-12 every 4-8 hours, Disp: 300 tablet, Rfl: 5 .  ondansetron (ZOFRAN) 4 MG tablet, Take 1 tablet (4 mg total) by mouth every 8 (eight) hours as needed for nausea or vomiting., Disp: 40 tablet, Rfl: 2 .  spironolactone (ALDACTONE) 25 MG tablet, Take 1 tablet (25 mg total) by mouth daily. In am, Disp: 90 tablet, Rfl: 3 .  Syringe/Needle, Disp, 25G X 1-1/2" 1 ML MISC, 1 Device by Does not apply route as directed., Disp: 30 each, Rfl: 11 .  traZODone (DESYREL) 50 MG tablet, Take 0.5-1 tablets (25-50 mg total) by mouth at bedtime as needed for sleep., Disp: 90 tablet, Rfl: 3 .  doxycycline (VIBRA-TABS) 100 MG tablet, Take 1 tablet (100 mg total) by mouth 2 (two) times daily. (Patient not taking: Reported on 02/26/2020), Disp: 20 tablet, Rfl: 0 .  hydrocortisone (ANUSOL-HC) 25 MG suppository, Place 1 suppository (25 mg total) rectally 2 (two) times daily. (Patient not taking: Reported on 02/26/2020), Disp: 12 suppository, Rfl: 0 .  Tiotropium Bromide Monohydrate (SPIRIVA RESPIMAT) 1.25 MCG/ACT AERS, Inhale 2 puffs into the lungs daily., Disp: 4 g, Rfl: 11  EXAM:  VITALS per patient if applicable:  GENERAL: alert, oriented, appears well and in no acute distress  HEENT: atraumatic, conjunttiva clear, no obvious abnormalities on inspection of external nose and ears  NECK: normal movements of the head and  neck  LUNGS: on inspection no signs of respiratory distress, breathing rate appears normal, no obvious gross SOB, gasping or wheezing  CV: no obvious cyanosis  MS: moves all visible extremities without noticeable abnormality  PSYCH/NEURO: pleasant and cooperative, no obvious depression or anxiety, speech and thought processing grossly intact  ASSESSMENT AND PLAN:  Discussed the following assessment and plan:  COPD exacerbation (HCC)+/-asthma exacerbation with parainflu 3 - Plan: Tiotropium Bromide Monohydrate (SPIRIVA RESPIMAT) 1.25 MCG/ACT AERS, Ambulatory referral to Pulmonology Symbicort, albuterol, duoneb cont  rec smoking cessation   Thrombocytopenia (HCC) F/u h/o   -we discussed possible serious and likely etiologies, options for evaluation and workup, limitations of telemedicine visit vs in person visit, treatment, treatment risks and  precautions. Pt prefers to treat via telemedicine empirically rather then risking or undertaking an in person visit at this moment. Patient agrees to seek prompt in person care if worsening, new symptoms arise, or if is not improving with treatment.   I discussed the assessment and treatment plan with the patient. The patient was provided an opportunity to ask questions and all were answered. The patient agreed with the plan and demonstrated an understanding of the instructions.   The patient was advised to call back or seek an in-person evaluation if the symptoms worsen or if the condition fails to improve as anticipated.  Time spent 20 min Delorise Jackson, MD

## 2020-03-01 ENCOUNTER — Encounter: Payer: Self-pay | Admitting: Oncology

## 2020-03-01 NOTE — Progress Notes (Signed)
Patient called for pre screening. She denied any pain or concerns at this time.

## 2020-03-02 ENCOUNTER — Inpatient Hospital Stay: Payer: BC Managed Care – PPO

## 2020-03-02 ENCOUNTER — Other Ambulatory Visit: Payer: Self-pay

## 2020-03-02 ENCOUNTER — Inpatient Hospital Stay: Payer: BC Managed Care – PPO | Attending: Oncology | Admitting: Oncology

## 2020-03-02 VITALS — BP 144/92 | HR 87 | Temp 98.0°F | Resp 16 | Wt 185.8 lb

## 2020-03-02 DIAGNOSIS — R531 Weakness: Secondary | ICD-10-CM | POA: Diagnosis not present

## 2020-03-02 DIAGNOSIS — F17210 Nicotine dependence, cigarettes, uncomplicated: Secondary | ICD-10-CM | POA: Diagnosis not present

## 2020-03-02 DIAGNOSIS — D696 Thrombocytopenia, unspecified: Secondary | ICD-10-CM

## 2020-03-02 DIAGNOSIS — J439 Emphysema, unspecified: Secondary | ICD-10-CM | POA: Diagnosis not present

## 2020-03-02 DIAGNOSIS — D693 Immune thrombocytopenic purpura: Secondary | ICD-10-CM | POA: Diagnosis not present

## 2020-03-02 DIAGNOSIS — Z79899 Other long term (current) drug therapy: Secondary | ICD-10-CM | POA: Insufficient documentation

## 2020-03-02 DIAGNOSIS — I10 Essential (primary) hypertension: Secondary | ICD-10-CM | POA: Insufficient documentation

## 2020-03-02 LAB — CBC WITH DIFFERENTIAL/PLATELET
Abs Immature Granulocytes: 0.06 10*3/uL (ref 0.00–0.07)
Basophils Absolute: 0.1 10*3/uL (ref 0.0–0.1)
Basophils Relative: 1 %
Eosinophils Absolute: 0.2 10*3/uL (ref 0.0–0.5)
Eosinophils Relative: 2 %
HCT: 45.5 % (ref 36.0–46.0)
Hemoglobin: 15.4 g/dL — ABNORMAL HIGH (ref 12.0–15.0)
Immature Granulocytes: 1 %
Lymphocytes Relative: 27 %
Lymphs Abs: 3 10*3/uL (ref 0.7–4.0)
MCH: 33.5 pg (ref 26.0–34.0)
MCHC: 33.8 g/dL (ref 30.0–36.0)
MCV: 98.9 fL (ref 80.0–100.0)
Monocytes Absolute: 1 10*3/uL (ref 0.1–1.0)
Monocytes Relative: 9 %
Neutro Abs: 6.5 10*3/uL (ref 1.7–7.7)
Neutrophils Relative %: 60 %
Platelets: 147 10*3/uL — ABNORMAL LOW (ref 150–400)
RBC: 4.6 MIL/uL (ref 3.87–5.11)
RDW: 13 % (ref 11.5–15.5)
WBC: 10.8 10*3/uL — ABNORMAL HIGH (ref 4.0–10.5)
nRBC: 0 % (ref 0.0–0.2)

## 2020-03-04 ENCOUNTER — Ambulatory Visit (INDEPENDENT_AMBULATORY_CARE_PROVIDER_SITE_OTHER): Payer: BC Managed Care – PPO | Admitting: Psychology

## 2020-03-04 ENCOUNTER — Encounter: Payer: Self-pay | Admitting: Internal Medicine

## 2020-03-04 DIAGNOSIS — F411 Generalized anxiety disorder: Secondary | ICD-10-CM | POA: Diagnosis not present

## 2020-03-08 ENCOUNTER — Ambulatory Visit (INDEPENDENT_AMBULATORY_CARE_PROVIDER_SITE_OTHER): Payer: BC Managed Care – PPO | Admitting: Psychology

## 2020-03-08 DIAGNOSIS — F411 Generalized anxiety disorder: Secondary | ICD-10-CM

## 2020-03-11 ENCOUNTER — Telehealth: Payer: Self-pay | Admitting: Internal Medicine

## 2020-03-11 NOTE — Telephone Encounter (Signed)
Left message to return call. FMLA paperwork has been completed for patient. Copy sent to scan and copy placed upfront for patient to pick up.   Mychart message sent to inform the patient as well.

## 2020-03-12 NOTE — Progress Notes (Signed)
[place]  [phone] [phone]) [phone]  ID: Carly [name] OB: [birth year]  MR#: [id number]  ELF#:[id number]  Patient Care Team: McLean-Scocuzza, Nino Glow, MD as PCP - General (Internal Medicine) Lloyd Huger, MD as Consulting Physician (Oncology)  I connected with Carly [name] on 03/18/20 at 10:45 AM EDT by video enabled telemedicine visit and verified that I am speaking with the correct person using two identifiers.   I discussed the limitations, risks, security and privacy concerns of performing an evaluation and management service by telemedicine and the availability of in-person appointments. I also discussed with the patient that there may be a patient responsible charge related to this service. The patient expressed understanding and agreed to proceed.   Other persons participating in the visit and their role in the encounter: Patient, MD.  Patient's location: Clinic. Provider's location: Home.  CHIEF COMPLAINT: ITP.  INTERVAL HISTORY: Patient agreed to video assisted telemedicine visit for further evaluation and continuation of Nplate.  Her weakness and fatigue have resolved and she feels back to her baseline.  She does not complain of any easy bleeding or bruising.  She has no neurologic complaints.  She denies any recent fevers or illnesses.  She has a good appetite and denies weight loss.  She denies any chest pain, shortness of breath, cough, or hemoptysis. She denies any nausea, vomiting, constipation, or diarrhea.  She has no urinary complaints.  Patient offers no specific complaints today.  REVIEW OF SYSTEMS:   Review of Systems  Constitutional: Negative.  Negative for fever, malaise/fatigue and weight loss.  Respiratory: Negative.  Negative for cough, hemoptysis and shortness of breath.   Cardiovascular: Negative.  Negative for chest pain and leg swelling.  Gastrointestinal: Negative.  Negative for abdominal pain, blood in stool  and melena.  Genitourinary: Negative.  Negative for hematuria.  Musculoskeletal: Negative.  Negative for back pain.  Skin: Negative.  Negative for rash.  Neurological: Negative.  Negative for dizziness, focal weakness, weakness and headaches.  Endo/Heme/Allergies: Negative.  Does not bruise/bleed easily.  Psychiatric/Behavioral: Negative.  The patient is not nervous/anxious.     As per HPI. Otherwise, a complete review of systems is negative.  PAST MEDICAL HISTORY: Past Medical History:  Diagnosis Date  . Anxiety   . Chicken pox   . Depression   . Elevated testosterone level in female   . Emphysema of lung (HCC)    bronchitis  . Frequent headaches   . Hay fever   . Hypertension     PAST SURGICAL HISTORY: Past Surgical History:  Procedure Laterality Date  . HYSTEROSCOPY WITH NOVASURE N/A 02/21/2018   Procedure: HYSTEROSCOPY WITH NOVASURE, POLYPECTOMY;  Surgeon: Benjaman Kindler, MD;  Location: [facility] ORS;  Service: Gynecology;  Laterality: N/A;  . TUBAL LIGATION  1997  . TUBAL LIGATION      FAMILY HISTORY: Family History  Problem Relation Age of Onset  . Hyperlipidemia Mother   . Heart disease Mother   . Stroke Mother   . Depression Mother   . Mental illness Mother   . Heart attack Mother 47  . Hyperlipidemia Father   . Depression Father   . Mental illness Father   . Diabetes Paternal Grandfather   . Cancer Cousin     ADVANCED DIRECTIVES (Y/N):  N  HEALTH MAINTENANCE: Social History   Tobacco Use  . Smoking status: Current Every Day Smoker    Packs/day: 0.50    Years: 28.00    Pack years: 14.00  Types: Cigarettes  . Smokeless tobacco: Never Used  Vaping Use  . Vaping Use: Never used  Substance Use Topics  . Alcohol use: Yes    Alcohol/week: 8.0 standard drinks    Types: 8 Standard drinks or equivalent per week    Comment: Beer (Can)   . Drug use: No    Comment: CBD oil     Colonoscopy:  PAP:  Bone density:  Lipid panel:  Allergies  Allergen  Reactions  . Meloxicam Nausea Only    Current Outpatient Medications  Medication Sig Dispense Refill  . Acetaminophen (TYLENOL EXTRA STRENGTH PO) Take by mouth.    Marland Kitchen albuterol (PROVENTIL) (2.5 MG/3ML) 0.083% nebulizer solution Take 3 mLs (2.5 mg total) by nebulization every 6 (six) hours as needed for wheezing or shortness of breath. 360 mL 11  . albuterol (VENTOLIN HFA) 108 (90 Base) MCG/ACT inhaler Inhale into the lungs every 6 (six) hours as needed for wheezing or shortness of breath.    . ALPRAZolam (XANAX) 0.25 MG tablet Take 1 tablet (0.25 mg total) by mouth daily as needed for anxiety. 30 tablet 2  . amLODipine (NORVASC) 10 MG tablet Take 1 tablet (10 mg total) by mouth daily. 90 tablet 3  . benzonatate (TESSALON PERLES) 100 MG capsule Take 1 capsule (100 mg total) by mouth 3 (three) times daily as needed for cough. FOR DISRUPTIVE DAY TIME COUGH 20 capsule 0  . budesonide-formoterol (SYMBICORT) 160-4.5 MCG/ACT inhaler Inhale 2 puffs into the lungs 2 (two) times daily. Rinse mouth 1 Inhaler 11  . carvedilol (COREG) 25 MG tablet Take 1 tablet (25 mg total) by mouth 2 (two) times daily with a meal. 180 tablet 3  . cetirizine (ZYRTEC) 10 MG tablet Take 1 tablet (10 mg total) by mouth daily. 90 tablet 3  . Cholecalciferol 1.25 MG (50000 UT) capsule Take 1 capsule (50,000 Units total) by mouth every 30 (thirty) days. 1x per month note this 13 capsule 0  . cyanocobalamin (,VITAMIN B-12,) 1000 MCG/ML injection 1 mL 1x per week x 4 weeks then every 30 days 8 mL 3  . diclofenac Sodium (VOLTAREN) 1 % GEL Apply 2-4 g topically 4 (four) times daily. 2 grams upper body qid prn and 4 gram lower body qid prn 150 g 11  . dicyclomine (BENTYL) 10 MG capsule Take 1 capsule (10 mg total) by mouth 3 (three) times daily as needed for spasms. Before food 90 capsule 2  . escitalopram (LEXAPRO) 20 MG tablet Take 1 tablet (20 mg total) by mouth daily. 90 tablet 3  . fluticasone (FLONASE) 50 MCG/ACT nasal spray Place  2 sprays into both nostrils daily. 16 g 11  . mirabegron ER (MYRBETRIQ) 25 MG TB24 tablet Take 1 tablet (25 mg total) by mouth daily. 90 tablet 3  . nicotine polacrilex (NICORETTE) 4 MG gum Take 1 each (4 mg total) by mouth as needed for smoking cessation. Chew 1 piece every 1-2 hours max 24 pieces per day x 1-6 weeks then week 7-9 every 2-4 hours and week 10-12 every 4-8 hours 300 tablet 5  . ondansetron (ZOFRAN) 4 MG tablet Take 1 tablet (4 mg total) by mouth every 8 (eight) hours as needed for nausea or vomiting. 40 tablet 2  . spironolactone (ALDACTONE) 25 MG tablet Take 1 tablet (25 mg total) by mouth daily. In am 90 tablet 3  . Syringe/Needle, Disp, 25G X 1-1/2" 1 ML MISC 1 Device by Does not apply route as directed. 30 each 11  .  Tiotropium Bromide Monohydrate (SPIRIVA RESPIMAT) 1.25 MCG/ACT AERS Inhale 2 puffs into the lungs daily. 4 g 11  . traZODone (DESYREL) 50 MG tablet Take 0.5-1 tablets (25-50 mg total) by mouth at bedtime as needed for sleep. 90 tablet 3   No current facility-administered medications for this visit.   Facility-Administered Medications Ordered in Other Visits  Medication Dose Route Frequency Provider Last Rate Last Admin  . romiPLOStim (NPLATE) injection 170 mcg  2 mcg/kg Subcutaneous Once Lloyd Huger, MD        OBJECTIVE: Vitals:   03/18/20 1036  BP: 135/77  Pulse: 85  Resp: 20  Temp: 99.1 F (37.3 C)  SpO2: 100%     Body mass index is 33.37 kg/m.    ECOG FS:0 - Asymptomatic  General: Well-developed, well-nourished, no acute distress. HEENT: Normocephalic. Neuro: Alert, answering all questions appropriately. Cranial nerves grossly intact. Psych: Normal affect.   LAB RESULTS:  Lab Results  Component Value Date   NA 141 06/10/2019   K 3.6 06/10/2019   CL 104 06/10/2019   CO2 27 06/10/2019   GLUCOSE 95 06/10/2019   BUN 4 (L) 06/10/2019   CREATININE 0.60 06/10/2019   CALCIUM 8.9 06/10/2019   PROT 6.4 06/10/2019   ALBUMIN 3.8  06/10/2019   AST 34 06/10/2019   ALT 21 06/10/2019   ALKPHOS 73 06/10/2019   BILITOT 0.7 06/10/2019   GFRNONAA >60 12/08/2018   GFRAA >60 12/08/2018    Lab Results  Component Value Date   WBC 9.2 03/18/2020   NEUTROABS 5.6 03/18/2020   HGB 15.1 (H) 03/18/2020   HCT 42.7 03/18/2020   MCV 96.2 03/18/2020   PLT 45 (L) 03/18/2020     STUDIES: No results found.  ASSESSMENT: ITP  PLAN:    1.  ITP: Confirmed with normal bone marrow biopsy and normal FISH and cytogenetics on July 22, 2019.  Patient has a positive ANA which is likely clinically insignificant.  The remainder of her laboratory work was either negative or within normal limits. CT scan on March 04, 2017 did not reveal splenomegaly.  Despite 1 week of prednisone at 1 mg/kg dose, patient had no appreciable change in her platelet count.  Rituxan was denied by insurance.  Patient received her most recent injection of Nplate at [facility] on February 19, 2020.  Her platelet count has dropped to 45 today, therefore will proceed with 2 mcg/kg Nplate today.  Return to clinic in 2 weeks for laboratory work and consideration of Nplate.  Patient will then return to clinic in 4 weeks for further evaluation and continuation of treatment.  Will consider increasing dose at next clinic visit.    2.  Positive ANA: Likely clinically insignificant.  Referral was previously sent to rheumatology.  I provided 30 minutes of face-to-face video visit time during this encounter which included chart review, counseling, and coordination of care as documented above.   Patient expressed understanding and was in agreement with this plan. She also understands that She can call clinic at any time with any questions, concerns, or complaints.    Lloyd Huger, MD   03/18/2020 10:55 AM

## 2020-03-17 ENCOUNTER — Encounter: Payer: Self-pay | Admitting: Oncology

## 2020-03-18 ENCOUNTER — Inpatient Hospital Stay (HOSPITAL_BASED_OUTPATIENT_CLINIC_OR_DEPARTMENT_OTHER): Payer: BC Managed Care – PPO | Admitting: Oncology

## 2020-03-18 ENCOUNTER — Encounter: Payer: Self-pay | Admitting: Oncology

## 2020-03-18 ENCOUNTER — Inpatient Hospital Stay: Payer: BC Managed Care – PPO | Attending: Oncology

## 2020-03-18 ENCOUNTER — Other Ambulatory Visit: Payer: Self-pay

## 2020-03-18 ENCOUNTER — Inpatient Hospital Stay: Payer: BC Managed Care – PPO

## 2020-03-18 VITALS — BP 135/77 | HR 85 | Temp 99.1°F | Resp 20 | Wt 188.4 lb

## 2020-03-18 DIAGNOSIS — D696 Thrombocytopenia, unspecified: Secondary | ICD-10-CM

## 2020-03-18 DIAGNOSIS — D693 Immune thrombocytopenic purpura: Secondary | ICD-10-CM

## 2020-03-18 LAB — CBC WITH DIFFERENTIAL/PLATELET
Abs Immature Granulocytes: 0.03 10*3/uL (ref 0.00–0.07)
Basophils Absolute: 0.1 10*3/uL (ref 0.0–0.1)
Basophils Relative: 1 %
Eosinophils Absolute: 0.3 10*3/uL (ref 0.0–0.5)
Eosinophils Relative: 3 %
HCT: 42.7 % (ref 36.0–46.0)
Hemoglobin: 15.1 g/dL — ABNORMAL HIGH (ref 12.0–15.0)
Immature Granulocytes: 0 %
Lymphocytes Relative: 28 %
Lymphs Abs: 2.5 10*3/uL (ref 0.7–4.0)
MCH: 34 pg (ref 26.0–34.0)
MCHC: 35.4 g/dL (ref 30.0–36.0)
MCV: 96.2 fL (ref 80.0–100.0)
Monocytes Absolute: 0.8 10*3/uL (ref 0.1–1.0)
Monocytes Relative: 8 %
Neutro Abs: 5.6 10*3/uL (ref 1.7–7.7)
Neutrophils Relative %: 60 %
Platelets: 45 10*3/uL — ABNORMAL LOW (ref 150–400)
RBC: 4.44 MIL/uL (ref 3.87–5.11)
RDW: 12.7 % (ref 11.5–15.5)
WBC: 9.2 10*3/uL (ref 4.0–10.5)
nRBC: 0 % (ref 0.0–0.2)

## 2020-03-18 MED ORDER — ROMIPLOSTIM 250 MCG ~~LOC~~ SOLR
2.0000 ug/kg | Freq: Once | SUBCUTANEOUS | Status: AC
  Administered 2020-03-18: 170 ug via SUBCUTANEOUS
  Filled 2020-03-18: qty 0.34

## 2020-03-21 ENCOUNTER — Ambulatory Visit: Payer: BC Managed Care – PPO | Admitting: Psychology

## 2020-03-25 DIAGNOSIS — M7661 Achilles tendinitis, right leg: Secondary | ICD-10-CM | POA: Diagnosis not present

## 2020-03-29 ENCOUNTER — Telehealth (INDEPENDENT_AMBULATORY_CARE_PROVIDER_SITE_OTHER): Payer: BC Managed Care – PPO | Admitting: Internal Medicine

## 2020-03-29 ENCOUNTER — Other Ambulatory Visit: Payer: Self-pay

## 2020-03-29 ENCOUNTER — Encounter: Payer: Self-pay | Admitting: Internal Medicine

## 2020-03-29 VITALS — BP 120/72 | HR 94 | Ht 63.0 in | Wt 186.0 lb

## 2020-03-29 DIAGNOSIS — J441 Chronic obstructive pulmonary disease with (acute) exacerbation: Secondary | ICD-10-CM | POA: Diagnosis not present

## 2020-03-29 DIAGNOSIS — M7661 Achilles tendinitis, right leg: Secondary | ICD-10-CM

## 2020-03-29 DIAGNOSIS — M6788 Other specified disorders of synovium and tendon, other site: Secondary | ICD-10-CM

## 2020-03-29 DIAGNOSIS — M7751 Other enthesopathy of right foot: Secondary | ICD-10-CM

## 2020-03-29 DIAGNOSIS — Z1231 Encounter for screening mammogram for malignant neoplasm of breast: Secondary | ICD-10-CM

## 2020-03-29 DIAGNOSIS — D693 Immune thrombocytopenic purpura: Secondary | ICD-10-CM

## 2020-03-29 DIAGNOSIS — R42 Dizziness and giddiness: Secondary | ICD-10-CM | POA: Insufficient documentation

## 2020-03-29 MED ORDER — AZITHROMYCIN 250 MG PO TABS: ORAL_TABLET | ORAL | 0 refills | Status: DC

## 2020-03-29 MED ORDER — PREDNISONE 20 MG PO TABS: 40.0000 mg | ORAL_TABLET | Freq: Every day | ORAL | 0 refills | Status: DC

## 2020-03-29 NOTE — Progress Notes (Signed)
Telephone Note  I connected with Carly [name]  on 03/29/20 at  3:20 PM EDT telephone and verified that I am speaking with the correct person using two identifiers.  Location patient: home Location provider:work or home office Persons participating in the virtual visit: patient, provider  I discussed the limitations of evaluation and management by telemedicine and the availability of in person appointments. The patient expressed understanding and agreed to proceed.   HPI: 1. ITP plts 46 f/u 9/17 h/o for labs to f/u she is seeing dots on her skin  2. Of note son + covid 03/28/20 but she has not been around him in 1 month  3. Copd/asthma/allergies flare 02/17/20 [facility] Today c/w cough O2 mid to upper 90s (94% today) using symbicort/spiriva and still coughing with recent admission [facility] 02/17/20 and [facility] appt 8/2/21visit  She has cut back smoking to 1/2 the amouth  4. C/o dizziness with standing not sure if drinking enough water but eats a lot of ice. Dizziness worse x 1 year and having 1x per week or 1x every other week. Room is not spinning at times she feels something in her chest. Declines cards for now. She wonders if at times she is dizzy anxiety is elevated as well  5. Achilles tendon right foot and bone spur in [facility] since 03/25/20    ROS: See pertinent positives and negatives per HPI.  Past Medical History:  Diagnosis Date  . Anxiety   . Chicken pox   . Depression   . Elevated testosterone level in female   . Emphysema of lung (HCC)    bronchitis  . Frequent headaches   . Hay fever   . Hypertension     Past Surgical History:  Procedure Laterality Date  . HYSTEROSCOPY WITH NOVASURE N/A 02/21/2018   Procedure: HYSTEROSCOPY WITH NOVASURE, POLYPECTOMY;  Surgeon: Benjaman Kindler, MD;  Location: [facility] OR[facility];  Service: Gynecology;  Laterality: N/A;  . TUBAL LIGATION  1997  . TUBAL LIGATION      Family History  Problem Relation Age of Onset  . Hyperlipidemia Mother   .  Heart disease Mother   . Stroke Mother   . Depression Mother   . Mental illness Mother   . Heart attack Mother 4  . Hyperlipidemia Father   . Depression Father   . Mental illness Father   . Diabetes Paternal Grandfather   . Cancer Cousin     SOCIAL HX: married   Current Outpatient Medications:  .  Acetaminophen (TYLENOL EXTRA STRENGTH PO), Take by mouth., Disp: , Rfl:  .  albuterol (PROVENTIL) (2.5 MG/3ML) 0.083% nebulizer solution, Take 3 mLs (2.5 mg total) by nebulization every 6 (six) hours as needed for wheezing or shortness of breath., Disp: 360 mL, Rfl: 11 .  albuterol (VENTOLIN HFA) 108 (90 Base) MCG/ACT inhaler, Inhale into the lungs every 6 (six) hours as needed for wheezing or shortness of breath., Disp: , Rfl:  .  ALPRAZolam (XANAX) 0.25 MG tablet, Take 1 tablet (0.25 mg total) by mouth daily as needed for anxiety., Disp: 30 tablet, Rfl: 2 .  amLODipine (NORVASC) 10 MG tablet, Take 1 tablet (10 mg total) by mouth daily., Disp: 90 tablet, Rfl: 3 .  budesonide-formoterol (SYMBICORT) 160-4.5 MCG/ACT inhaler, Inhale 2 puffs into the lungs 2 (two) times daily. Rinse mouth, Disp: 1 Inhaler, Rfl: 11 .  carvedilol (COREG) 25 MG tablet, Take 1 tablet (25 mg total) by mouth 2 (two) times daily with a meal., Disp:  180 tablet, Rfl: 3 .  cetirizine (ZYRTEC) 10 MG tablet, Take 1 tablet (10 mg total) by mouth daily., Disp: 90 tablet, Rfl: 3 .  Cholecalciferol 1.25 MG (50000 UT) capsule, Take 1 capsule (50,000 Units total) by mouth every 30 (thirty) days. 1x per month note this, Disp: 13 capsule, Rfl: 0 .  cyanocobalamin (,VITAMIN B-12,) 1000 MCG/ML injection, 1 mL 1x per week x 4 weeks then every 30 days, Disp: 8 mL, Rfl: 3 .  diclofenac Sodium (VOLTAREN) 1 % GEL, Apply 2-4 g topically 4 (four) times daily. 2 grams upper body qid prn and 4 gram lower body qid prn, Disp: 150 g, Rfl: 11 .  dicyclomine (BENTYL) 10 MG capsule, Take 1 capsule (10 mg total) by mouth 3 (three) times daily as needed  for spasms. Before food, Disp: 90 capsule, Rfl: 2 .  escitalopram (LEXAPRO) 20 MG tablet, Take 1 tablet (20 mg total) by mouth daily., Disp: 90 tablet, Rfl: 3 .  fluticasone (FLONASE) 50 MCG/ACT nasal spray, Place 2 sprays into both nostrils daily., Disp: 16 g, Rfl: 11 .  mirabegron ER (MYRBETRIQ) 25 MG TB24 tablet, Take 1 tablet (25 mg total) by mouth daily., Disp: 90 tablet, Rfl: 3 .  nicotine polacrilex (NICORETTE) 4 MG gum, Take 1 each (4 mg total) by mouth as needed for smoking cessation. Chew 1 piece every 1-2 hours max 24 pieces per day x 1-6 weeks then week 7-9 every 2-4 hours and week 10-12 every 4-8 hours, Disp: 300 tablet, Rfl: 5 .  ondansetron (ZOFRAN) 4 MG tablet, Take 1 tablet (4 mg total) by mouth every 8 (eight) hours as needed for nausea or vomiting., Disp: 40 tablet, Rfl: 2 .  spironolactone (ALDACTONE) 25 MG tablet, Take 1 tablet (25 mg total) by mouth daily. In am, Disp: 90 tablet, Rfl: 3 .  Syringe/Needle, Disp, 25G X 1-1/2" 1 ML MISC, 1 Device by Does not apply route as directed., Disp: 30 each, Rfl: 11 .  Tiotropium Bromide Monohydrate (SPIRIVA RESPIMAT) 1.25 MCG/ACT AERS, Inhale 2 puffs into the lungs daily., Disp: 4 g, Rfl: 11 .  traZODone (DESYREL) 50 MG tablet, Take 0.5-1 tablets (25-50 mg total) by mouth at bedtime as needed for sleep., Disp: 90 tablet, Rfl: 3 .  azithromycin (ZITHROMAX) 250 MG tablet, 2 pills day 1 and 1 pill day 2-5, Disp: 6 tablet, Rfl: 0 .  predniSONE (DELTASONE) 20 MG tablet, Take 2 tablets (40 mg total) by mouth daily with breakfast., Disp: 14 tablet, Rfl: 0  EXAM:  VITALS per patient if applicable:  GENERAL: alert, oriented, appears well and in no acute distress  PSYCH/NEURO: pleasant and cooperative, no obvious depression or anxiety, speech and thought processing grossly intact  ASSESSMENT AND PLAN:  Discussed the following assessment and plan:  COPD exacerbation ([place]) with h/o asthma - Plan: azithromycin (ZITHROMAX) 250 MG tablet,  predniSONE (DELTASONE) 40 MG tablet qd x 1 week F/u pulm 04/28/20 calling qd for cancellations  rec smoking cessation   Dizziness - Plan: CT Head Wo Contrast Consider cards for zio declines for now   Chronic ITP (idiopathic thrombocytopenia) ([place]) 04/01/20 h/o lab upcoming and f/u Dr. Grayland Ormond   Achilles tendonosis Bone spur of right foot Tendonitis, Achilles, right F/u [facility]   HM Flu shotutd tdap utd due in 2026 covid 19 vaccine consider in futurewill need to be approved Dr. Grayland Ormond due to chronic ITP  Pap overdue sch Dr. Cresenciano Lick to Dr. Langston Reusing ablation and cervical polyps -she will call  [facility] Dr. Leonides Schanz in [place] and get this scheduled due for pap  mammo1/4/21neg ordered 2022   Colonoscopy consider in future [facility] female[facility] low plts 1st consider in future   Skinreferred webb avederm appt sch'ed or had  -we discussed possible serious and likely etiologies, options for evaluation and workup, limitations of telemedicine visit vs in person visit, treatment, treatment risks and precautions. Pt prefers to treat via telemedicine empirically rather then risking or undertaking an in person visit at this moment.  Work/School slipped offered: Advised to seek prompt follow up telemedicine visit or in person care if worsening, new symptoms arise, or if is not improving with treatment. Did let her know that I only do telemedicine on Tuesdays and Thursdays for [place] and advised follow up visit with PCP or [facility] if needs follow up or if any further questions arise to avoid any delays.   I discussed the assessment and treatment plan with the patient. The patient was provided an opportunity to ask questions and all were answered. The patient agreed with the plan and demonstrated an understanding of the instructions.   The patient was advised to call back or seek an in-person evaluation if the symptoms worsen or if the condition fails to improve  as anticipated.  Time spent 30 min Delorise Jackson, MD

## 2020-04-01 ENCOUNTER — Other Ambulatory Visit: Payer: Self-pay

## 2020-04-01 ENCOUNTER — Inpatient Hospital Stay: Payer: BC Managed Care – PPO

## 2020-04-01 ENCOUNTER — Other Ambulatory Visit: Payer: Self-pay | Admitting: *Deleted

## 2020-04-01 VITALS — BP 133/84 | HR 83

## 2020-04-01 DIAGNOSIS — D693 Immune thrombocytopenic purpura: Secondary | ICD-10-CM | POA: Diagnosis not present

## 2020-04-01 LAB — CBC WITH DIFFERENTIAL/PLATELET
Abs Immature Granulocytes: 0.02 10*3/uL (ref 0.00–0.07)
Basophils Absolute: 0.1 10*3/uL (ref 0.0–0.1)
Basophils Relative: 1 %
Eosinophils Absolute: 0.2 10*3/uL (ref 0.0–0.5)
Eosinophils Relative: 3 %
HCT: 39.6 % (ref 36.0–46.0)
Hemoglobin: 13.7 g/dL (ref 12.0–15.0)
Immature Granulocytes: 0 %
Lymphocytes Relative: 25 %
Lymphs Abs: 2 10*3/uL (ref 0.7–4.0)
MCH: 34 pg (ref 26.0–34.0)
MCHC: 34.6 g/dL (ref 30.0–36.0)
MCV: 98.3 fL (ref 80.0–100.0)
Monocytes Absolute: 0.8 10*3/uL (ref 0.1–1.0)
Monocytes Relative: 10 %
Neutro Abs: 4.8 10*3/uL (ref 1.7–7.7)
Neutrophils Relative %: 61 %
Platelets: 91 10*3/uL — ABNORMAL LOW (ref 150–400)
RBC: 4.03 MIL/uL (ref 3.87–5.11)
RDW: 13.9 % (ref 11.5–15.5)
Smear Review: DECREASED
WBC: 7.9 10*3/uL (ref 4.0–10.5)
nRBC: 0 % (ref 0.0–0.2)

## 2020-04-01 MED ORDER — ROMIPLOSTIM 250 MCG ~~LOC~~ SOLR
2.0000 ug/kg | Freq: Once | SUBCUTANEOUS | Status: AC
  Administered 2020-04-01: 170 ug via SUBCUTANEOUS
  Filled 2020-04-01: qty 0.34

## 2020-04-04 DIAGNOSIS — M722 Plantar fascial fibromatosis: Secondary | ICD-10-CM | POA: Diagnosis not present

## 2020-04-07 ENCOUNTER — Other Ambulatory Visit: Payer: Self-pay

## 2020-04-07 ENCOUNTER — Encounter: Payer: Self-pay | Admitting: Obstetrics and Gynecology

## 2020-04-07 ENCOUNTER — Inpatient Hospital Stay: Admission: RE | Admit: 2020-04-07 | Payer: BC Managed Care – PPO | Source: Ambulatory Visit

## 2020-04-07 ENCOUNTER — Encounter
Admission: RE | Disposition: A | Discharge status: Home / Self Care | Payer: Self-pay | Source: Home / Self Care | Attending: Obstetrics and Gynecology

## 2020-04-07 ENCOUNTER — Ambulatory Visit: Payer: BC Managed Care – PPO | Admitting: Anesthesiology

## 2020-04-07 ENCOUNTER — Observation Stay
Admission: RE | Admit: 2020-04-07 | Discharge: 2020-04-09 | Disposition: A | Discharge status: Home / Self Care | Payer: BC Managed Care – PPO | Attending: Obstetrics and Gynecology | Admitting: Obstetrics and Gynecology

## 2020-04-07 DIAGNOSIS — Z01818 Encounter for other preprocedural examination: Secondary | ICD-10-CM | POA: Diagnosis not present

## 2020-04-07 DIAGNOSIS — F17210 Nicotine dependence, cigarettes, uncomplicated: Secondary | ICD-10-CM | POA: Diagnosis not present

## 2020-04-07 DIAGNOSIS — X58XXXA Exposure to other specified factors, initial encounter: Secondary | ICD-10-CM | POA: Insufficient documentation

## 2020-04-07 DIAGNOSIS — Z79899 Other long term (current) drug therapy: Secondary | ICD-10-CM | POA: Insufficient documentation

## 2020-04-07 DIAGNOSIS — R52 Pain, unspecified: Secondary | ICD-10-CM | POA: Diagnosis not present

## 2020-04-07 DIAGNOSIS — N898 Other specified noninflammatory disorders of vagina: Secondary | ICD-10-CM | POA: Diagnosis not present

## 2020-04-07 DIAGNOSIS — D693 Immune thrombocytopenic purpura: Secondary | ICD-10-CM

## 2020-04-07 DIAGNOSIS — S3023XA Contusion of vagina and vulva, initial encounter: Secondary | ICD-10-CM | POA: Diagnosis not present

## 2020-04-07 DIAGNOSIS — S300XXA Contusion of lower back and pelvis, initial encounter: Secondary | ICD-10-CM | POA: Diagnosis not present

## 2020-04-07 DIAGNOSIS — S3095XA Unspecified superficial injury of vagina and vulva, initial encounter: Secondary | ICD-10-CM | POA: Diagnosis not present

## 2020-04-07 DIAGNOSIS — M160 Bilateral primary osteoarthritis of hip: Secondary | ICD-10-CM | POA: Diagnosis not present

## 2020-04-07 DIAGNOSIS — N9089 Other specified noninflammatory disorders of vulva and perineum: Secondary | ICD-10-CM | POA: Diagnosis not present

## 2020-04-07 DIAGNOSIS — I10 Essential (primary) hypertension: Secondary | ICD-10-CM | POA: Diagnosis not present

## 2020-04-07 DIAGNOSIS — S3993XA Unspecified injury of pelvis, initial encounter: Secondary | ICD-10-CM | POA: Diagnosis not present

## 2020-04-07 HISTORY — PX: HEMATOMA EVACUATION: SHX5118

## 2020-04-07 LAB — CBC WITH DIFFERENTIAL/PLATELET
Abs Immature Granulocytes: 0.08 10*3/uL — ABNORMAL HIGH (ref 0.00–0.07)
Basophils Absolute: 0 10*3/uL (ref 0.0–0.1)
Basophils Relative: 0 %
Eosinophils Absolute: 0 10*3/uL (ref 0.0–0.5)
Eosinophils Relative: 0 %
HCT: 40.8 % (ref 36.0–46.0)
Hemoglobin: 13.9 g/dL (ref 12.0–15.0)
Immature Granulocytes: 1 %
Lymphocytes Relative: 16 %
Lymphs Abs: 1.4 10*3/uL (ref 0.7–4.0)
MCH: 34 pg (ref 26.0–34.0)
MCHC: 34.1 g/dL (ref 30.0–36.0)
MCV: 99.8 fL (ref 80.0–100.0)
Monocytes Absolute: 0.7 10*3/uL (ref 0.1–1.0)
Monocytes Relative: 8 %
Neutro Abs: 6.6 10*3/uL (ref 1.7–7.7)
Neutrophils Relative %: 75 %
Platelets: 99 10*3/uL — ABNORMAL LOW (ref 150–400)
RBC: 4.09 MIL/uL (ref 3.87–5.11)
RDW: 14.2 % (ref 11.5–15.5)
WBC: 8.9 10*3/uL (ref 4.0–10.5)
nRBC: 0 % (ref 0.0–0.2)

## 2020-04-07 LAB — COMPREHENSIVE METABOLIC PANEL
ALT: 27 U/L (ref 0–44)
AST: 52 U/L — ABNORMAL HIGH (ref 15–41)
Albumin: 3.8 g/dL (ref 3.5–5.0)
Alkaline Phosphatase: 58 U/L (ref 38–126)
Anion gap: 14 (ref 5–15)
BUN: 6 mg/dL (ref 6–20)
CO2: 20 mmol/L — ABNORMAL LOW (ref 22–32)
Calcium: 8.7 mg/dL — ABNORMAL LOW (ref 8.9–10.3)
Chloride: 103 mmol/L (ref 98–111)
Creatinine, Ser: 0.5 mg/dL (ref 0.44–1.00)
GFR calc Af Amer: >60 mL/min (ref >60)
GFR calc non Af Amer: >60 mL/min (ref >60)
Glucose, Bld: 126 mg/dL — ABNORMAL HIGH (ref 70–99)
Potassium: 4 mmol/L (ref 3.5–5.1)
Sodium: 137 mmol/L (ref 135–145)
Total Bilirubin: 0.9 mg/dL (ref 0.3–1.2)
Total Protein: 6.7 g/dL (ref 6.5–8.1)

## 2020-04-07 LAB — POCT PREGNANCY, URINE: Preg Test, Ur: NEGATIVE

## 2020-04-07 SURGERY — EVACUATION HEMATOMA
Anesthesia: General | Site: Vagina

## 2020-04-07 MED ORDER — DICYCLOMINE HCL 10 MG PO CAPS
10.0000 mg | ORAL_CAPSULE | Freq: Three times a day (TID) | ORAL | Status: DC | PRN
  Filled 2020-04-07: qty 1

## 2020-04-07 MED ORDER — MIDAZOLAM HCL 2 MG/2ML IJ SOLN
INTRAMUSCULAR | Status: DC | PRN
  Administered 2020-04-07: 2 mg via INTRAVENOUS

## 2020-04-07 MED ORDER — FENTANYL CITRATE (PF) 100 MCG/2ML IJ SOLN
INTRAMUSCULAR | Status: AC
  Filled 2020-04-07: qty 2

## 2020-04-07 MED ORDER — ONDANSETRON HCL 4 MG/2ML IJ SOLN
INTRAMUSCULAR | Status: DC | PRN
  Administered 2020-04-07: 4 mg via INTRAVENOUS

## 2020-04-07 MED ORDER — PROPOFOL 10 MG/ML IV BOLUS
INTRAVENOUS | Status: AC
  Filled 2020-04-07: qty 20

## 2020-04-07 MED ORDER — DEXAMETHASONE SODIUM PHOSPHATE 10 MG/ML IJ SOLN
INTRAMUSCULAR | Status: AC
  Filled 2020-04-07: qty 1

## 2020-04-07 MED ORDER — ONDANSETRON HCL 4 MG/2ML IJ SOLN
4.0000 mg | Freq: Four times a day (QID) | INTRAMUSCULAR | Status: DC | PRN
  Administered 2020-04-07: 4 mg via INTRAVENOUS
  Filled 2020-04-07: qty 2

## 2020-04-07 MED ORDER — MIRABEGRON ER 25 MG PO TB24
25.0000 mg | ORAL_TABLET | Freq: Every day | ORAL | Status: DC
  Administered 2020-04-08 – 2020-04-09 (×2): 25 mg via ORAL
  Filled 2020-04-07 (×3): qty 1

## 2020-04-07 MED ORDER — ALUM & MAG HYDROXIDE-SIMETH 200-200-20 MG/5ML PO SUSP: 30.0000 mL | ORAL | Status: DC | PRN

## 2020-04-07 MED ORDER — ALBUTEROL SULFATE HFA 108 (90 BASE) MCG/ACT IN AERS
INHALATION_SPRAY | RESPIRATORY_TRACT | Status: AC
  Filled 2020-04-07: qty 6.7

## 2020-04-07 MED ORDER — ONDANSETRON HCL 4 MG PO TABS: 4.0000 mg | ORAL_TABLET | Freq: Three times a day (TID) | ORAL | Status: DC | PRN

## 2020-04-07 MED ORDER — DEXMEDETOMIDINE (PRECEDEX) IN NS 20 MCG/5ML (4 MCG/ML) IV SYRINGE
PREFILLED_SYRINGE | INTRAVENOUS | Status: AC
  Filled 2020-04-07: qty 5

## 2020-04-07 MED ORDER — DEXMEDETOMIDINE (PRECEDEX) IN NS 20 MCG/5ML (4 MCG/ML) IV SYRINGE
PREFILLED_SYRINGE | INTRAVENOUS | Status: DC | PRN
  Administered 2020-04-07: 8 ug via INTRAVENOUS
  Administered 2020-04-07: 12 ug via INTRAVENOUS

## 2020-04-07 MED ORDER — FENTANYL CITRATE (PF) 100 MCG/2ML IJ SOLN: 50.0000 ug | Freq: Once | INTRAMUSCULAR | Status: AC

## 2020-04-07 MED ORDER — ALBUTEROL SULFATE HFA 108 (90 BASE) MCG/ACT IN AERS
INHALATION_SPRAY | RESPIRATORY_TRACT | Status: DC | PRN
  Administered 2020-04-07: 3 via RESPIRATORY_TRACT

## 2020-04-07 MED ORDER — LACTATED RINGERS IV SOLN: INTRAVENOUS | Status: DC

## 2020-04-07 MED ORDER — FENTANYL CITRATE (PF) 100 MCG/2ML IJ SOLN: 25.0000 ug | INTRAMUSCULAR | Status: DC | PRN

## 2020-04-07 MED ORDER — ALBUTEROL SULFATE (2.5 MG/3ML) 0.083% IN NEBU: 2.5000 mg | INHALATION_SOLUTION | Freq: Four times a day (QID) | RESPIRATORY_TRACT | Status: DC | PRN

## 2020-04-07 MED ORDER — TRANEXAMIC ACID 1000 MG/10ML IV SOLN
INTRAVENOUS | Status: AC
  Filled 2020-04-07: qty 10

## 2020-04-07 MED ORDER — NICOTINE POLACRILEX 2 MG MT GUM
4.0000 mg | CHEWING_GUM | OROMUCOSAL | Status: DC | PRN
  Filled 2020-04-07: qty 2

## 2020-04-07 MED ORDER — ONDANSETRON HCL 4 MG/2ML IJ SOLN: 4.0000 mg | Freq: Once | INTRAMUSCULAR | Status: DC | PRN

## 2020-04-07 MED ORDER — FENTANYL CITRATE (PF) 100 MCG/2ML IJ SOLN
INTRAMUSCULAR | Status: AC
  Administered 2020-04-07: 50 ug via INTRAVENOUS
  Filled 2020-04-07: qty 2

## 2020-04-07 MED ORDER — DEXAMETHASONE SODIUM PHOSPHATE 10 MG/ML IJ SOLN
INTRAMUSCULAR | Status: DC | PRN
  Administered 2020-04-07: 5 mg via INTRAVENOUS

## 2020-04-07 MED ORDER — TRANEXAMIC ACID 1000 MG/10ML IV SOLN
1.5000 mg/kg/h | INTRAVENOUS | Status: AC
  Administered 2020-04-07: 29.621 mg/kg/h via INTRAVENOUS
  Filled 2020-04-07: qty 25

## 2020-04-07 MED ORDER — LIDOCAINE HCL (PF) 2 % IJ SOLN
INTRAMUSCULAR | Status: AC
  Filled 2020-04-07: qty 5

## 2020-04-07 MED ORDER — TIOTROPIUM BROMIDE MONOHYDRATE 18 MCG IN CAPS
18.0000 ug | ORAL_CAPSULE | Freq: Every day | RESPIRATORY_TRACT | Status: DC
  Administered 2020-04-09: 18 ug via RESPIRATORY_TRACT
  Filled 2020-04-07: qty 5

## 2020-04-07 MED ORDER — MENTHOL 3 MG MT LOZG
1.0000 | LOZENGE | OROMUCOSAL | Status: DC | PRN
  Filled 2020-04-07: qty 9

## 2020-04-07 MED ORDER — "SYRINGE/NEEDLE (DISP) 25G X 1-1/2"" 1 ML MISC": 1.0000 | Status: DC

## 2020-04-07 MED ORDER — TRAZODONE HCL 50 MG PO TABS
25.0000 mg | ORAL_TABLET | Freq: Every evening | ORAL | Status: DC | PRN
  Filled 2020-04-07: qty 1

## 2020-04-07 MED ORDER — FENTANYL CITRATE (PF) 100 MCG/2ML IJ SOLN
INTRAMUSCULAR | Status: DC | PRN
Start: 2020-04-07 — End: 2020-04-07
  Administered 2020-04-07 (×3): 50 ug via INTRAVENOUS
  Administered 2020-04-07: 100 ug via INTRAVENOUS
  Administered 2020-04-07: 50 ug via INTRAVENOUS

## 2020-04-07 MED ORDER — TIOTROPIUM BROMIDE MONOHYDRATE 1.25 MCG/ACT IN AERS: 2.0000 | INHALATION_SPRAY | Freq: Every day | RESPIRATORY_TRACT | Status: DC

## 2020-04-07 MED ORDER — CHLORHEXIDINE GLUCONATE 0.12 % MT SOLN
15.0000 mL | Freq: Once | OROMUCOSAL | Status: AC
  Administered 2020-04-07: 15 mL via OROMUCOSAL

## 2020-04-07 MED ORDER — LORATADINE 10 MG PO TABS
10.0000 mg | ORAL_TABLET | Freq: Every day | ORAL | Status: DC
  Administered 2020-04-08 – 2020-04-09 (×2): 10 mg via ORAL
  Filled 2020-04-07 (×2): qty 1

## 2020-04-07 MED ORDER — HYDROCODONE-ACETAMINOPHEN 5-325 MG PO TABS
1.0000 | ORAL_TABLET | ORAL | Status: DC | PRN
  Administered 2020-04-07 – 2020-04-09 (×10): 2 via ORAL
  Filled 2020-04-07 (×10): qty 2

## 2020-04-07 MED ORDER — CHLORHEXIDINE GLUCONATE 0.12 % MT SOLN
OROMUCOSAL | Status: AC
  Filled 2020-04-07: qty 15

## 2020-04-07 MED ORDER — BUPIVACAINE LIPOSOME 1.3 % IJ SUSP
INTRAMUSCULAR | Status: AC
  Filled 2020-04-07: qty 20

## 2020-04-07 MED ORDER — HYDROMORPHONE HCL 1 MG/ML IJ SOLN: 0.2000 mg | INTRAMUSCULAR | Status: DC | PRN

## 2020-04-07 MED ORDER — MOMETASONE FURO-FORMOTEROL FUM 200-5 MCG/ACT IN AERO
2.0000 | INHALATION_SPRAY | Freq: Two times a day (BID) | RESPIRATORY_TRACT | Status: DC
  Administered 2020-04-08 – 2020-04-09 (×2): 2 via RESPIRATORY_TRACT
  Filled 2020-04-07: qty 8.8

## 2020-04-07 MED ORDER — ACETAMINOPHEN 10 MG/ML IV SOLN
INTRAVENOUS | Status: DC | PRN
  Administered 2020-04-07: 1000 mg via INTRAVENOUS

## 2020-04-07 MED ORDER — ORAL CARE MOUTH RINSE: 15.0000 mL | Freq: Once | OROMUCOSAL | Status: AC

## 2020-04-07 MED ORDER — CARVEDILOL 25 MG PO TABS
25.0000 mg | ORAL_TABLET | Freq: Two times a day (BID) | ORAL | Status: DC
  Administered 2020-04-08 – 2020-04-09 (×2): 25 mg via ORAL
  Filled 2020-04-07 (×4): qty 1

## 2020-04-07 MED ORDER — ONDANSETRON HCL 4 MG/2ML IJ SOLN
INTRAMUSCULAR | Status: AC
  Filled 2020-04-07: qty 2

## 2020-04-07 MED ORDER — ALPRAZOLAM 0.5 MG PO TABS: 0.2500 mg | ORAL_TABLET | Freq: Every day | ORAL | Status: DC | PRN

## 2020-04-07 MED ORDER — AMLODIPINE BESYLATE 10 MG PO TABS
10.0000 mg | ORAL_TABLET | Freq: Every day | ORAL | Status: DC
  Administered 2020-04-08 – 2020-04-09 (×2): 10 mg via ORAL
  Filled 2020-04-07 (×2): qty 1

## 2020-04-07 MED ORDER — MIDAZOLAM HCL 2 MG/2ML IJ SOLN
INTRAMUSCULAR | Status: AC
  Filled 2020-04-07: qty 2

## 2020-04-07 MED ORDER — ACETAMINOPHEN 10 MG/ML IV SOLN
INTRAVENOUS | Status: AC
  Filled 2020-04-07: qty 100

## 2020-04-07 MED ORDER — DOCUSATE SODIUM 100 MG PO CAPS
100.0000 mg | ORAL_CAPSULE | Freq: Two times a day (BID) | ORAL | Status: DC
  Administered 2020-04-07 – 2020-04-09 (×3): 100 mg via ORAL
  Filled 2020-04-07 (×3): qty 1

## 2020-04-07 MED ORDER — PHENYLEPHRINE HCL (PRESSORS) 10 MG/ML IV SOLN
INTRAVENOUS | Status: DC | PRN
  Administered 2020-04-07: 100 ug via INTRAVENOUS

## 2020-04-07 MED ORDER — ONDANSETRON HCL 4 MG PO TABS: 4.0000 mg | ORAL_TABLET | Freq: Four times a day (QID) | ORAL | Status: DC | PRN

## 2020-04-07 MED ORDER — SPIRONOLACTONE 25 MG PO TABS
25.0000 mg | ORAL_TABLET | Freq: Every day | ORAL | Status: DC
  Administered 2020-04-08 – 2020-04-09 (×2): 25 mg via ORAL
  Filled 2020-04-07 (×2): qty 1

## 2020-04-07 MED ORDER — CEFAZOLIN SODIUM 1 G IJ SOLR
INTRAMUSCULAR | Status: AC
  Filled 2020-04-07: qty 20

## 2020-04-07 MED ORDER — SIMETHICONE 80 MG PO CHEW: 80.0000 mg | CHEWABLE_TABLET | Freq: Four times a day (QID) | ORAL | Status: DC | PRN

## 2020-04-07 MED ORDER — BUPIVACAINE-EPINEPHRINE (PF) 0.5% -1:200000 IJ SOLN
INTRAMUSCULAR | Status: AC
  Filled 2020-04-07: qty 30

## 2020-04-07 MED ORDER — BUPIVACAINE LIPOSOME 1.3 % IJ SUSP: INTRAMUSCULAR | Status: DC | PRN

## 2020-04-07 MED ORDER — NICOTINE 21 MG/24HR TD PT24
21.0000 mg | MEDICATED_PATCH | Freq: Every day | TRANSDERMAL | Status: DC
  Administered 2020-04-07 – 2020-04-08 (×2): 21 mg via TRANSDERMAL
  Filled 2020-04-07 (×3): qty 1

## 2020-04-07 MED ORDER — LIDOCAINE HCL (CARDIAC) PF 100 MG/5ML IV SOSY
PREFILLED_SYRINGE | INTRAVENOUS | Status: DC | PRN
  Administered 2020-04-07: 50 mg via INTRAVENOUS

## 2020-04-07 MED ORDER — ALBUTEROL SULFATE HFA 108 (90 BASE) MCG/ACT IN AERS: 1.0000 | INHALATION_SPRAY | Freq: Four times a day (QID) | RESPIRATORY_TRACT | Status: DC | PRN

## 2020-04-07 MED ORDER — FLUTICASONE PROPIONATE 50 MCG/ACT NA SUSP
2.0000 | Freq: Every day | NASAL | Status: DC
  Filled 2020-04-07: qty 16

## 2020-04-07 MED ORDER — CEFAZOLIN SODIUM-DEXTROSE 2-3 GM-%(50ML) IV SOLR
INTRAVENOUS | Status: DC | PRN
  Administered 2020-04-07: 2 g via INTRAVENOUS

## 2020-04-07 MED ORDER — ESCITALOPRAM OXALATE 10 MG PO TABS
20.0000 mg | ORAL_TABLET | Freq: Every day | ORAL | Status: DC
  Administered 2020-04-08 – 2020-04-09 (×2): 20 mg via ORAL
  Filled 2020-04-07 (×2): qty 2

## 2020-04-07 MED ORDER — PROPOFOL 10 MG/ML IV BOLUS
INTRAVENOUS | Status: DC | PRN
  Administered 2020-04-07: 50 mg via INTRAVENOUS
  Administered 2020-04-07: 100 mg via INTRAVENOUS
  Administered 2020-04-07: 150 mg via INTRAVENOUS
  Administered 2020-04-07: 100 mg via INTRAVENOUS

## 2020-04-07 SURGICAL SUPPLY — 36 items
BLADE SURG 11 STRL SS SAFETY (MISCELLANEOUS) ×3 IMPLANT
BLADE SURG 15 STRL LF DISP TIS (BLADE) ×1 IMPLANT
BLADE SURG 15 STRL SS (BLADE) ×3
CANISTER SUCT 1200ML W/VALVE (MISCELLANEOUS) ×3 IMPLANT
CATH ROBINSON RED A/P 16FR (CATHETERS) ×3 IMPLANT
COVER WAND RF STERILE (DRAPES) ×3 IMPLANT
DRAPE PERI LITHO V/GYN (MISCELLANEOUS) ×3 IMPLANT
DRAPE UNDER BUTTOCK W/FLU (DRAPES) ×3 IMPLANT
DRSG GAUZE FLUFF 36X18 (GAUZE/BANDAGES/DRESSINGS) ×3 IMPLANT
GAUZE PACKING IODOFORM 1/2 (PACKING) ×3 IMPLANT
GLOVE BIO SURGEON STRL SZ7 (GLOVE) ×3 IMPLANT
GLOVE INDICATOR 7.5 STRL GRN (GLOVE) ×3 IMPLANT
GOWN STRL REUS W/ TWL LRG LVL3 (GOWN DISPOSABLE) ×2 IMPLANT
GOWN STRL REUS W/TWL LRG LVL3 (GOWN DISPOSABLE) ×6
KIT TURNOVER CYSTO (KITS) ×3 IMPLANT
MAT ABSORB  FLUID 56X50 GRAY (MISCELLANEOUS) ×3
MAT ABSORB FLUID 56X50 GRAY (MISCELLANEOUS) ×1 IMPLANT
NEEDLE HYPO 22GX1.5 SAFETY (NEEDLE) ×3 IMPLANT
NS IRRIG 500ML POUR BTL (IV SOLUTION) ×3 IMPLANT
PACK BASIN MINOR (MISCELLANEOUS) ×3 IMPLANT
PAD ABD DERMACEA PRESS 5X9 (GAUZE/BANDAGES/DRESSINGS) ×6 IMPLANT
PAD OB MATERNITY 4.3X12.25 (PERSONAL CARE ITEMS) ×3 IMPLANT
PAD PREP 24X41 OB/GYN DISP (PERSONAL CARE ITEMS) ×3 IMPLANT
SOL PREP PVP 2OZ (MISCELLANEOUS) ×6
SOLUTION PREP PVP 2OZ (MISCELLANEOUS) ×2 IMPLANT
SUT CHROMIC 3 0 SH 27 (SUTURE) ×3 IMPLANT
SUT MNCRL 3-0 UNDYED SH (SUTURE) ×1 IMPLANT
SUT MON AB 2-0 CT1 36 (SUTURE) ×3 IMPLANT
SUT MONOCRYL 3-0 UNDYED (SUTURE) ×3
SUT VIC AB 2-0 SH 27 (SUTURE) ×9
SUT VIC AB 2-0 SH 27XBRD (SUTURE) ×3 IMPLANT
SYR 10ML LL (SYRINGE) ×3 IMPLANT
SYR 30ML LL (SYRINGE) ×3 IMPLANT
TAPE GLOW AND TELL VASCULAR (MISCELLANEOUS) ×3 IMPLANT
TOWEL OR 17X26 4PK STRL BLUE (TOWEL DISPOSABLE) ×3 IMPLANT
TRAY FOLEY SLVR 16FR LF STAT (SET/KITS/TRAYS/PACK) ×3 IMPLANT

## 2020-04-07 NOTE — H&P (Signed)
History and Physical   SERVICE: Gynecology   Patient Name: Carly [name] Patient MRN:   [id number]  CC: vulvar pain  HPI: Carly [name] is a 49 y.o. F presenting to urgent care [place] walk in after straddle injury to left vulva last night at 6pm. Hx of ITP, recent plt infusions. Plts today 99. No vaginal complaints. Able to urinate but external os is fairly obstructed. Is getting larger. Pain is very significant.   Review of Systems: positives in bold GEN:   fevers, chills, weight changes, appetite changes, fatigue, night sweats HEENT:  HA, vision changes, hearing loss, congestion, rhinorrhea, sinus pressure, dysphagia CV:   CP, palpitations PULM:  SOB, cough GI:  Vulvar pain, urinary hesitancy.  N/V/D/C GU:  dysuria, urgency, frequency MSK:  arthralgias, myalgias, back pain, swelling SKIN:  rashes, color changes, pallor NEURO:  numbness, weakness, tingling, seizures, dizziness, tremors PSYCH:  depression, anxiety, behavioral problems, confusion  HEME/LYMPH:  easy bruising or bleeding ENDO:  heat/cold intolerance  Past Obstetrical History: OB History   No obstetric history on file.     Past Gynecologic History: No LMP recorded. Patient has had an ablation.   Past Medical History: Past Medical History:  Diagnosis Date  . Anxiety   . Chicken pox   . Depression   . Elevated testosterone level in female   . Emphysema of lung (HCC)    bronchitis  . Frequent headaches   . Hay fever   . Hypertension     Past Surgical History:   Past Surgical History:  Procedure Laterality Date  . HYSTEROSCOPY WITH NOVASURE N/A 02/21/2018   Procedure: HYSTEROSCOPY WITH NOVASURE, POLYPECTOMY;  Surgeon: Benjaman Kindler, MD;  Location: [facility] ORS;  Service: Gynecology;  Laterality: N/A;  . TUBAL LIGATION  1997  . TUBAL LIGATION      Family History:  family history includes Cancer in her cousin; Depression in her father and mother; Diabetes in her paternal grandfather; Heart attack (age of  onset: 11) in her mother; Heart disease in her mother; Hyperlipidemia in her father and mother; Mental illness in her father and mother; Stroke in her mother.  Social History:  Social History   Socioeconomic History  . Marital status: Married    Spouse name: Not on file  . Number of children: Not on file  . Years of education: Not on file  . Highest education level: Not on file  Occupational History  . Not on file  Tobacco Use  . Smoking status: Current Every Day Smoker    Packs/day: 0.50    Years: 28.00    Pack years: 14.00    Types: Cigarettes  . Smokeless tobacco: Never Used  Vaping Use  . Vaping Use: Never used  Substance and Sexual Activity  . Alcohol use: Yes    Alcohol/week: 8.0 standard drinks    Types: 8 Standard drinks or equivalent per week    Comment: Beer (Can)   . Drug use: No    Comment: CBD oil  . Sexual activity: Yes    Partners: Male  Other Topics Concern  . Not on file  Social History Narrative   1 Year college    Works in a [place] [employer]   Married to [place]    Enjoys coloring    Pets: Dog lives inside   Children: 76 Son 26 Son   Social Determinants of Radio broadcast assistant Strain:   . Difficulty of Paying Living Expenses: Not on file  Food Insecurity:   . Worried About Charity fundraiser in the Last Year: Not on file  . Ran Out of Food in the Last Year: Not on file  Transportation Needs:   . Lack of Transportation (Medical): Not on file  . Lack of Transportation (Non-Medical): Not on file  Physical Activity:   . Days of Exercise per Week: Not on file  . Minutes of Exercise per Session: Not on file  Stress:   . Feeling of Stress : Not on file  Social Connections:   . Frequency of Communication with Friends and Family: Not on file  . Frequency of Social Gatherings with Friends and Family: Not on file  . Attends Religious Services: Not on file  . Active Member of Clubs or Organizations: Not on file  . Attends English as a second language teacher Meetings: Not on file  . Marital Status: Not on file  Intimate Partner Violence:   . Fear of Current or Ex-Partner: Not on file  . Emotionally Abused: Not on file  . Physically Abused: Not on file  . Sexually Abused: Not on file    Home Medications:  Medications reconciled in EPIC  No current facility-administered medications on file prior to encounter.   Current Outpatient Medications on File Prior to Encounter  Medication Sig Dispense Refill  . Acetaminophen (TYLENOL EXTRA STRENGTH PO) Take by mouth.    Marland Kitchen albuterol (PROVENTIL) (2.5 MG/3ML) 0.083% nebulizer solution Take 3 mLs (2.5 mg total) by nebulization every 6 (six) hours as needed for wheezing or shortness of breath. 360 mL 11  . albuterol (VENTOLIN HFA) 108 (90 Base) MCG/ACT inhaler Inhale into the lungs every 6 (six) hours as needed for wheezing or shortness of breath.    . ALPRAZolam (XANAX) 0.25 MG tablet Take 1 tablet (0.25 mg total) by mouth daily as needed for anxiety. 30 tablet 2  . amLODipine (NORVASC) 10 MG tablet Take 1 tablet (10 mg total) by mouth daily. 90 tablet 3  . azithromycin (ZITHROMAX) 250 MG tablet 2 pills day 1 and 1 pill day 2-5 6 tablet 0  . carvedilol (COREG) 25 MG tablet Take 1 tablet (25 mg total) by mouth 2 (two) times daily with a meal. 180 tablet 3  . cetirizine (ZYRTEC) 10 MG tablet Take 1 tablet (10 mg total) by mouth daily. 90 tablet 3  . Cholecalciferol 1.25 MG (50000 UT) capsule Take 1 capsule (50,000 Units total) by mouth every 30 (thirty) days. 1x per month note this 13 capsule 0  . cyanocobalamin (,VITAMIN B-12,) 1000 MCG/ML injection 1 mL 1x per week x 4 weeks then every 30 days 8 mL 3  . diclofenac Sodium (VOLTAREN) 1 % GEL Apply 2-4 g topically 4 (four) times daily. 2 grams upper body qid prn and 4 gram lower body qid prn 150 g 11  . dicyclomine (BENTYL) 10 MG capsule Take 1 capsule (10 mg total) by mouth 3 (three) times daily as needed for spasms. Before food 90 capsule 2  .  escitalopram (LEXAPRO) 20 MG tablet Take 1 tablet (20 mg total) by mouth daily. 90 tablet 3  . fluticasone (FLONASE) 50 MCG/ACT nasal spray Place 2 sprays into both nostrils daily. 16 g 11  . mirabegron ER (MYRBETRIQ) 25 MG TB24 tablet Take 1 tablet (25 mg total) by mouth daily. 90 tablet 3  . nicotine polacrilex (NICORETTE) 4 MG gum Take 1 each (4 mg total) by mouth as needed for smoking cessation. Chew 1 piece every 1-2 hours  max 24 pieces per day x 1-6 weeks then week 7-9 every 2-4 hours and week 10-12 every 4-8 hours 300 tablet 5  . ondansetron (ZOFRAN) 4 MG tablet Take 1 tablet (4 mg total) by mouth every 8 (eight) hours as needed for nausea or vomiting. 40 tablet 2  . spironolactone (ALDACTONE) 25 MG tablet Take 1 tablet (25 mg total) by mouth daily. In am 90 tablet 3  . Syringe/Needle, Disp, 25G X 1-1/2" 1 ML MISC 1 Device by Does not apply route as directed. 30 each 11  . traZODone (DESYREL) 50 MG tablet Take 0.5-1 tablets (25-50 mg total) by mouth at bedtime as needed for sleep. 90 tablet 3  . budesonide-formoterol (SYMBICORT) 160-4.5 MCG/ACT inhaler Inhale 2 puffs into the lungs 2 (two) times daily. Rinse mouth (Patient not taking: Reported on 04/07/2020) 1 Inhaler 11  . predniSONE (DELTASONE) 20 MG tablet Take 2 tablets (40 mg total) by mouth daily with breakfast. 14 tablet 0  . Tiotropium Bromide Monohydrate (SPIRIVA RESPIMAT) 1.25 MCG/ACT AERS Inhale 2 puffs into the lungs daily. (Patient not taking: Reported on 04/07/2020) 4 g 11    Allergies:  Allergies  Allergen Reactions  . Meloxicam Nausea Only    Physical Exam:  Temp:  [98.6 F (37 C)] 98.6 F (37 C) (09/23 1413) Pulse Rate:  [98] 98 (09/23 1528) Resp:  [18-20] 18 (09/23 1528) BP: (138-144)/(84-97) 144/84 (09/23 1528) SpO2:  [100 %] 100 % (09/23 1528) Weight:  [84.4 kg] 84.4 kg (09/23 1413)   General Appearance:  Well developed, well nourished, no acute distress, alert and oriented x3 HEENT:  Normocephalic atraumatic,  extraocular movements intact, moist mucous membranes Cardiovascular:  Normal S1/S2, regular rate and rhythm, no murmurs Pulmonary:  clear to auscultation, no wheezes, rales or rhonchi, symmetric air entry, good air exchange Abdomen:  Bowel sounds present, soft, nontender, nondistended, no abnormal masses, no epigastric pain Extremities:  Full range of motion, no pedal edema, 2+ distal pulses, no tenderness Skin:  normal coloration and turgor, no rashes, no suspicious skin lesions noted  Neurologic:  Cranial nerves 2-12 grossly intact, normal muscle tone, strength 5/5 all four extremities Psychiatric:  Normal mood and affect, appropriate, no AH/VH Pelvic:  NEFG, extensive left vulvar hematoma measuring 25cm superior to inferior and 15cm laterally. Crosses the midline. Unable to stand.   Labs/Studies:   CBC and Coags:  Lab Results  Component Value Date   WBC 8.9 04/07/2020   NEUTOPHILPCT 75 04/07/2020   EOSPCT 0 04/07/2020   BASOPCT 0 04/07/2020   LYMPHOPCT 16 04/07/2020   HGB 13.9 04/07/2020   HCT 40.8 04/07/2020   MCV 99.8 04/07/2020   PLT 99 (L) 04/07/2020   INR 0.9 07/22/2019   CMP:  Lab Results  Component Value Date   NA 137 04/07/2020   K 4.0 04/07/2020   CL 103 04/07/2020   CO2 20 (L) 04/07/2020   BUN 6 04/07/2020   CREATININE 0.50 04/07/2020   CREATININE 0.60 06/10/2019   CREATININE 0.58 12/08/2018   GLU 75 10/22/2012   PROT 6.7 04/07/2020   BILITOT 0.9 04/07/2020   ALT 27 04/07/2020   AST 52 (H) 04/07/2020   ALKPHOS 58 04/07/2020    Other Imaging: No results found.   Assessment / Plan:   MALEY VENEZIA is a 49 y.o. F who presents with expanding vulvar hematoma and low platelets  1. After discussion of r/b/a (including pressure for tamponade) will go to the OR for evacuation of hematoma. Aware of  possible need for blood transfusion, plts. Plan for evacuation then pressure and ice with close followup.   Will see in office tomorrow and then in 3 days.

## 2020-04-07 NOTE — Anesthesia Preprocedure Evaluation (Addendum)
Anesthesia Evaluation  Patient identified by MRN, date of birth, ID band Patient awake    Reviewed: Allergy & Precautions, NPO status , Patient's Chart, lab work & pertinent test results  History of Anesthesia Complications Negative for: history of anesthetic complications  Airway Mallampati: II  TM Distance: >3 FB Neck ROM: limited    Dental  (+) Dental Advidsory Given, Missing, Poor Dentition, Chipped   Pulmonary neg shortness of breath, pneumonia, resolved, COPD, neg recent URI, Current Smoker,    Pulmonary exam normal        Cardiovascular hypertension, (-) angina(-) Past MI and (-) Cardiac Stents Normal cardiovascular exam(-) dysrhythmias (-) Valvular Problems/Murmurs     Neuro/Psych  Headaches, neg Seizures PSYCHIATRIC DISORDERS Anxiety Depression  Neuromuscular disease    GI/Hepatic negative GI ROS, Neg liver ROS, neg GERD  ,  Endo/Other  negative endocrine ROS  Renal/GU negative Renal ROS  negative genitourinary   Musculoskeletal  (+) Arthritis ,   Abdominal Normal abdominal exam  (+)   Peds negative pediatric ROS (+)  Hematology negative hematology ROS (+)   Anesthesia Other Findings Past Medical History: No date: Anxiety No date: Chicken pox No date: Depression No date: Elevated testosterone level in female No date: Emphysema of lung (HCC)     Comment:  bronchitis No date: Frequent headaches No date: Hay fever No date: Hypertension   Reproductive/Obstetrics                            Anesthesia Physical  Anesthesia Plan  ASA: II  Anesthesia Plan: General   Post-op Pain Management:    Induction: Intravenous  PONV Risk Score and Plan: 2 and Ondansetron, Dexamethasone, Midazolam and Treatment may vary due to age or medical condition  Airway Management Planned: LMA  Additional Equipment:   Intra-op Plan:   Post-operative Plan: Extubation in OR  Informed Consent:  I have reviewed the patients History and Physical, chart, labs and discussed the procedure including the risks, benefits and alternatives for the proposed anesthesia with the patient or authorized representative who has indicated his/her understanding and acceptance.     Dental advisory given  Plan Discussed with: CRNA and Surgeon  Anesthesia Plan Comments: (Patient consented for risks of anesthesia including but not limited to:  - adverse reactions to medications - damage to eyes, teeth, lips or other oral mucosa - nerve damage due to positioning  - sore throat or hoarseness - Damage to heart, brain, nerves, lungs, other parts of body or loss of life  Patient voiced understanding.)       Anesthesia Quick Evaluation

## 2020-04-07 NOTE — Op Note (Signed)
  Vulvar Surgery Procedure Note  Date of procedure: 04/07/2020   Pre-operative Diagnosis: Left vulvar posttraumatic hematoma, hx of ITP  Post-operative Diagnosis: same  Procedure:  - Exam under anesthesia - Vulvar hematoma evacuation - vulvar pressure dressing placed - cold compress to the vulva -exparel instilled  Surgeon: Benjaman Kindler, MD  Assistant(s):  CST  Anesthesia: General LMA anesthesia  Anesthesiologist:  Anesthesiologist: Piscitello, Precious Haws, MD CRNA: Lerry Liner, CRNA  Estimated Blood Loss:  150         Drains: foley placed, 2 iodoform dressings placed         Total IV Fluids: 1125ml  Urine Output: 154ml         Specimens: None         Complications:  None; patient tolerated the procedure well.         Disposition: PACU - hemodynamically stable.         Condition: stable  Findings: Left-sided vulvar hematoma that extended across the midline and to the lateral edge of the anus; edema encompassed both the left labia minora and majora and right clitoral hood with ecchymosis extending into the mons, but did not extend into the vagina; normal perineum and vagina. The swelling closed her introitus, and a foley was placed ppx at the end of the case, with the plan to remove it in the morning.  Indications: Significant pain, expanding hematoma with low platelets   Procedure Details  The patient was taken to Operating Room, identified as the correct patient and the procedure verified as an exam under anesthesia and vulvar hematoma evacuation and drainage. A formal Time Out was held with all team members present and in agreement.  After induction of anesthesia, the patient was draped and prepped in the usual sterile manner. Surgical pictures were taken. Exam under anesthesia revealed the above findings. 3 stab wounds made at the areas of extravasation of blood throught the skin, and small loculations were broken up with a tonsil until the fluid ran clear  or as bright red blood, and a stab incision was used laterally to be certain that all areas were drained. The ulcerated area was covered with a bandage and the other two incisions allowed to drain. No drains placed. All three areas were packed and pressure held for 10 minutes. 41ml of exparel +51ml of 0.5% bupivacaine instilled along pudendal nerve and along edges of hematoma. Foley cath placed.  Instrument, sponge, and needle counts were correct at the conclusion of the case.   Overnight monitoring for pain control with po Norco and iv rescue as needed, with plan to remove foley and packing in the morning. I anticipate pressure will be needed to control bleeding at that time.  The patient tolerated the procedure well and was transferred to the recovery room in stable condition.   Benjaman Kindler, MD 04/07/2020

## 2020-04-07 NOTE — OR Nursing (Signed)
**  IODOFORM PACKING X 3 IN LEFT VULVA

## 2020-04-07 NOTE — Anesthesia Postprocedure Evaluation (Signed)
Anesthesia Post Note  Patient: Carly [name]  Procedure(s) Performed: EVACUATION HEMATOMA  AND APPLICATION OF PRESSURE DRESSING (N/A Vagina )  Patient location during evaluation: PACU Anesthesia Type: General Level of consciousness: awake and alert Pain management: pain level controlled Vital Signs Assessment: post-procedure vital signs reviewed and stable Respiratory status: spontaneous breathing, nonlabored ventilation, respiratory function stable and patient connected to nasal cannula oxygen Cardiovascular status: blood pressure returned to baseline and stable Postop Assessment: no apparent nausea or vomiting Anesthetic complications: no   No complications documented.   Last Vitals:  Vitals:   04/07/20 1918 04/07/20 1934  BP: (!) 144/89 (!) 141/82  Pulse: 92 91  Resp: 14 17  Temp:    SpO2: 94% 98%    Last Pain:  Vitals:   04/07/20 1934  PainSc: 0-No pain                 Precious Haws Helmi Hechavarria

## 2020-04-07 NOTE — Transfer of Care (Signed)
Immediate Anesthesia Transfer of Care Note  Patient: Carly [name]  Procedure(s) Performed: EVACUATION HEMATOMA  AND APPLICATION OF PRESSURE DRESSING (N/A Vagina )  Patient Location: PACU  Anesthesia Type:General  Level of Consciousness: drowsy  Airway & Oxygen Therapy: Patient Spontanous Breathing and Patient connected to face mask oxygen  Post-op Assessment: Report given to RN  Post vital signs: stable  Last Vitals:  Vitals Value Taken Time  BP 113/100 04/07/20 1848  Temp    Pulse 96 04/07/20 1850  Resp 11 04/07/20 1850  SpO2 100 % 04/07/20 1850  Vitals shown include unvalidated device data.  Last Pain:  Vitals:   04/07/20 1530  PainSc: 5          Complications: No complications documented.

## 2020-04-07 NOTE — Anesthesia Procedure Notes (Signed)
Procedure Name: LMA Insertion Date/Time: 04/07/2020 5:26 PM Performed by: Lerry Liner, CRNA Pre-anesthesia Checklist: Patient identified, Emergency Drugs available, Suction available and Patient being monitored Patient Re-evaluated:Patient Re-evaluated prior to induction Oxygen Delivery Method: Circle system utilized Preoxygenation: Pre-oxygenation with 100% oxygen Induction Type: IV induction Ventilation: Mask ventilation without difficulty LMA: LMA inserted LMA Size: 3.5 Number of attempts: 1 Tube secured with: Tape Dental Injury: Teeth and Oropharynx as per pre-operative assessment

## 2020-04-08 ENCOUNTER — Encounter: Payer: Self-pay | Admitting: Obstetrics and Gynecology

## 2020-04-08 DIAGNOSIS — F17210 Nicotine dependence, cigarettes, uncomplicated: Secondary | ICD-10-CM | POA: Diagnosis not present

## 2020-04-08 DIAGNOSIS — X58XXXA Exposure to other specified factors, initial encounter: Secondary | ICD-10-CM | POA: Diagnosis not present

## 2020-04-08 DIAGNOSIS — D693 Immune thrombocytopenic purpura: Secondary | ICD-10-CM | POA: Diagnosis not present

## 2020-04-08 DIAGNOSIS — Z79899 Other long term (current) drug therapy: Secondary | ICD-10-CM | POA: Diagnosis not present

## 2020-04-08 DIAGNOSIS — S3095XA Unspecified superficial injury of vagina and vulva, initial encounter: Secondary | ICD-10-CM | POA: Diagnosis not present

## 2020-04-08 DIAGNOSIS — S3023XA Contusion of vagina and vulva, initial encounter: Secondary | ICD-10-CM | POA: Diagnosis not present

## 2020-04-08 DIAGNOSIS — I10 Essential (primary) hypertension: Secondary | ICD-10-CM | POA: Diagnosis not present

## 2020-04-08 LAB — CBC
HCT: 35.8 % — ABNORMAL LOW (ref 36.0–46.0)
Hemoglobin: 12.2 g/dL (ref 12.0–15.0)
MCH: 34.6 pg — ABNORMAL HIGH (ref 26.0–34.0)
MCHC: 34.1 g/dL (ref 30.0–36.0)
MCV: 101.4 fL — ABNORMAL HIGH (ref 80.0–100.0)
Platelets: 65 10*3/uL — ABNORMAL LOW (ref 150–400)
RBC: 3.53 MIL/uL — ABNORMAL LOW (ref 3.87–5.11)
RDW: 14.5 % (ref 11.5–15.5)
WBC: 10 10*3/uL (ref 4.0–10.5)
nRBC: 0 % (ref 0.0–0.2)

## 2020-04-08 LAB — BASIC METABOLIC PANEL
Anion gap: 11 (ref 5–15)
BUN: 6 mg/dL (ref 6–20)
CO2: 25 mmol/L (ref 22–32)
Calcium: 8.5 mg/dL — ABNORMAL LOW (ref 8.9–10.3)
Chloride: 104 mmol/L (ref 98–111)
Creatinine, Ser: 0.77 mg/dL (ref 0.44–1.00)
GFR calc Af Amer: >60 mL/min (ref >60)
GFR calc non Af Amer: >60 mL/min (ref >60)
Glucose, Bld: 131 mg/dL — ABNORMAL HIGH (ref 70–99)
Potassium: 4.6 mmol/L (ref 3.5–5.1)
Sodium: 140 mmol/L (ref 135–145)

## 2020-04-08 MED ORDER — SODIUM CHLORIDE 0.9% IV SOLUTION: Freq: Once | INTRAVENOUS | Status: AC

## 2020-04-08 MED ORDER — ROMIPLOSTIM INJECTION 500 MCG
3.0000 ug/kg | Freq: Once | SUBCUTANEOUS | Status: DC
  Filled 2020-04-08: qty 0.51

## 2020-04-08 MED ORDER — ROMIPLOSTIM 250 MCG ~~LOC~~ SOLR
250.0000 ug | Freq: Once | SUBCUTANEOUS | Status: AC
  Administered 2020-04-08: 250 ug via SUBCUTANEOUS
  Filled 2020-04-08: qty 0.5

## 2020-04-08 MED ORDER — DIPHENHYDRAMINE HCL 25 MG PO CAPS
50.0000 mg | ORAL_CAPSULE | Freq: Once | ORAL | Status: AC
  Administered 2020-04-08: 50 mg via ORAL

## 2020-04-08 MED ORDER — NICOTINE 21 MG/24HR TD PT24
21.0000 mg | MEDICATED_PATCH | Freq: Every day | TRANSDERMAL | 0 refills | Status: DC
Start: 2020-04-08 — End: 2020-10-29

## 2020-04-08 MED ORDER — HYDROCODONE-ACETAMINOPHEN 5-325 MG PO TABS: 1.0000 | ORAL_TABLET | ORAL | 0 refills | Status: DC | PRN

## 2020-04-08 MED ORDER — RHO D IMMUNE GLOBULIN 1500 UNIT/2ML IJ SOSY
300.0000 ug | PREFILLED_SYRINGE | Freq: Once | INTRAMUSCULAR | Status: AC
  Administered 2020-04-08: 300 ug via INTRAVENOUS
  Filled 2020-04-08: qty 2

## 2020-04-08 MED ORDER — DIPHENHYDRAMINE HCL 25 MG PO CAPS
ORAL_CAPSULE | ORAL | Status: AC
  Filled 2020-04-08: qty 2

## 2020-04-08 NOTE — Progress Notes (Signed)
Foley removed at 0730 am. Packing removed from left vulva by Dr. Leafy Ro 0900. Eccymosis edge marked at 0900. NPLATE injection given 1052 per recommendation by Dr. Grayland Ormond.  Pt to received platelet infusion. Spoke with Simona Huh in blood bank. Pt is RH-, in house platelets RH+ so pt will need rhogam with infusion. Dr. Leafy Ro notified and stated to proceed with blood banks recommendations. Dennis in blood bank updated.   F/u appointment with Dr. Leafy Ro 9/27 @ 1145 am to check bruising & platelets.  Pt updated on current plan of care.

## 2020-04-08 NOTE — Progress Notes (Signed)
PT seen and evaluated. Ecchymosis on mons extending minimally above line drawn at 9am. She received plt transfusion (1 unit) and Nplate today. Will monitor for bleeding response overnight with plan for d/c in the morning.  Repeat plt count in morning.

## 2020-04-08 NOTE — Discharge Instructions (Signed)
Pick up pain medicine from pharmacy  Please go to appointment with Dr. Leafy Ro Monday @ 1145 am. will check your bruising and platelet levels.  Keep Friday appointment at Dr. Gary Fleet office.  Monitor bruising levels at home and call if concenred  Please remember:   Keep your bottom ELEVATED, to keep gravity from pulling more blood into your vulva.  Please keep your vulva under compression as you can. There is a device called V2, which is made for this purpose. You can google it. It is about $50. Also you can use tight underwear and a pad to push on your vulva and discourage more bleeding.  Please keep ice on that area for the next several days to discourage active bleeding too. You can get a flexible ice pack, or use frozen peas, or freeze water mixed with rubbing alcohol on a Maxi pad.  Avoid ibuprofen and naproxen (these are NSAIDs) for the next couple of weeks, since they can interfere with platelets working.

## 2020-04-08 NOTE — Progress Notes (Signed)
Platelet infusion started at 1304. Called out at 1350 c/o generalized itching. No rash noted.  Dr. Leafy Ro notified, see orders.

## 2020-04-08 NOTE — Discharge Summary (Signed)
2 Days Post-Op       Procedure(s): EVACUATION HEMATOMA  AND APPLICATION OF PRESSURE DRESSING (N/A) Subjective: The patient is doing well.  No nausea or vomiting. Pain is adequately controlled. Able to ambulate to bathroom.   Objective: Vital signs in last 24 hours: Temp:  [97.8 F (36.6 C)-98.5 F (36.9 C)] 98.3 F (36.8 C) (09/25 0800) Pulse Rate:  [69-80] 73 (09/25 0801) Resp:  [17-18] 18 (09/25 0800) BP: (109-135)/(59-81) 115/74 (09/25 0801) SpO2:  [94 %-99 %] 97 % (09/25 0801)  Intake/Output  Intake/Output Summary (Last 24 hours) at 04/09/2020 0934 Last data filed at 04/08/2020 2200 Gross per 24 hour  Intake 1340.2 ml  Output 850 ml  Net 490.2 ml    Physical Exam:  General: Alert and oriented. CV: RRR Lungs: Clear bilaterally. GI: Soft, Nondistended. Incisions: Clean and dry. Mons bruising: bruising noted over mons pubis with deep purple discoloration.  Mild ecchymosis noted extending past demarcation line.   Extremities: Nontender, no erythema, no edema.  Lab Results: Recent Labs    04/07/20 1411 04/08/20 0537 04/09/20 0709  HGB 13.9 12.2 12.5  HCT 40.8 35.8* 38.6  WBC 8.9 10.0 9.1  PLT 99* 65* 80*                 Results for orders placed or performed during the hospital encounter of 04/07/20 (from the past 24 hour(s))  CBC     Status: Abnormal   Collection Time: 04/09/20  7:09 AM  Result Value Ref Range   WBC 9.1 4.0 - 10.5 K/uL   RBC 3.72 (L) 3.87 - 5.11 MIL/uL   Hemoglobin 12.5 12.0 - 15.0 g/dL   HCT 38.6 36 - 46 %   MCV 103.8 (H) 80.0 - 100.0 fL   MCH 33.6 26.0 - 34.0 pg   MCHC 32.4 30.0 - 36.0 g/dL   RDW 14.4 11.5 - 15.5 %   Platelets 80 (L) 150 - 400 K/uL   nRBC 0.0 0.0 - 0.2 %    Assessment/Plan: 2 Days Post-Op       Procedure(s): EVACUATION HEMATOMA  AND APPLICATION OF PRESSURE DRESSING (N/A)   Postop: Stable. No s/s/infection.  Vulvar ecchymosis: stable from line drawn last night at 6pm.   Idiopathic thrombocytopenia: s/p 1 unit  plt transfusion, plts stable this morning. S/p Nplate yesterday per hem-onc. Plt unit was Rh positive and pt is Rh neg, so was given rhogam per Blood Bank recommendations.   Plan discussed with Dr. Leafy Ro.    Discharge home with instructions for  - continue pressure on vulva and ice to area - continue norco as needed, currently q4-6 hrs. Hold NSAIDs. Script sent to pharmacy - continue nicotine patch as outpatient, as patient is trying to quit - recommend V2 vulvar compression device. $50 online   Benjaman Kindler, MD   LOS: 0 days   Minda Meo 04/09/2020, 9:34 AM   Drinda Butts, CNM Certified Nurse Midwife [facility]

## 2020-04-08 NOTE — Progress Notes (Signed)
1 Day Post-Op       Procedure(s): EVACUATION HEMATOMA  AND APPLICATION OF PRESSURE DRESSING (N/A) Subjective: The patient is doing well.  No nausea or vomiting. Pain is adequately controlled.  Objective: Vital signs in last 24 hours: Temp:  [97.6 F (36.4 C)-98.6 F (37 C)] 98.2 F (36.8 C) (09/24 0815) Pulse Rate:  [79-98] 79 (09/24 0815) Resp:  [13-20] 16 (09/24 0815) BP: (121-153)/(71-97) 134/76 (09/24 0815) SpO2:  [93 %-100 %] 97 % (09/24 0815) Weight:  [84.4 kg] 84.4 kg (09/23 1413)  Intake/Output  Intake/Output Summary (Last 24 hours) at 04/08/2020 0922 Last data filed at 04/08/2020 0430 Gross per 24 hour  Intake 2781.43 ml  Output 1625 ml  Net 1156.43 ml    Physical Exam:  General: Alert and oriented. CV: RRR Lungs: Clear bilaterally. GI: Soft, Nondistended. Incisions: Clean and dry. Urine: Clear, Foley in place Extremities: Nontender, no erythema, no edema.  Lab Results: Recent Labs    04/07/20 1411 04/08/20 0537  HGB 13.9 12.2  HCT 40.8 35.8*  WBC 8.9 10.0  PLT 99* 65*                 Results for orders placed or performed during the hospital encounter of 04/07/20 (from the past 24 hour(s))  Pregnancy, urine POC     Status: None   Collection Time: 04/07/20  2:05 PM  Result Value Ref Range   Preg Test, Ur NEGATIVE NEGATIVE  Comprehensive metabolic panel per protocol     Status: Abnormal   Collection Time: 04/07/20  2:11 PM  Result Value Ref Range   Sodium 137 135 - 145 mmol/L   Potassium 4.0 3.5 - 5.1 mmol/L   Chloride 103 98 - 111 mmol/L   CO2 20 (L) 22 - 32 mmol/L   Glucose, Bld 126 (H) 70 - 99 mg/dL   BUN 6 6 - 20 mg/dL   Creatinine, Ser 0.50 0.44 - 1.00 mg/dL   Calcium 8.7 (L) 8.9 - 10.3 mg/dL   Total Protein 6.7 6.5 - 8.1 g/dL   Albumin 3.8 3.5 - 5.0 g/dL   AST 52 (H) 15 - 41 U/L   ALT 27 0 - 44 U/L   Alkaline Phosphatase 58 38 - 126 U/L   Total Bilirubin 0.9 0.3 - 1.2 mg/dL   GFR calc non Af Amer >60 >60 mL/min   GFR calc Af Amer >60  >60 mL/min   Anion gap 14 5 - 15  CBC with Differential/Platelet     Status: Abnormal   Collection Time: 04/07/20  2:11 PM  Result Value Ref Range   WBC 8.9 4.0 - 10.5 K/uL   RBC 4.09 3.87 - 5.11 MIL/uL   Hemoglobin 13.9 12.0 - 15.0 g/dL   HCT 40.8 36 - 46 %   MCV 99.8 80.0 - 100.0 fL   MCH 34.0 26.0 - 34.0 pg   MCHC 34.1 30.0 - 36.0 g/dL   RDW 14.2 11.5 - 15.5 %   Platelets 99 (L) 150 - 400 K/uL   nRBC 0.0 0.0 - 0.2 %   Neutrophils Relative % 75 %   Neutro Abs 6.6 1.7 - 7.7 K/uL   Lymphocytes Relative 16 %   Lymphs Abs 1.4 0.7 - 4.0 K/uL   Monocytes Relative 8 %   Monocytes Absolute 0.7 0 - 1 K/uL   Eosinophils Relative 0 %   Eosinophils Absolute 0.0 0 - 0 K/uL   Basophils Relative 0 %   Basophils Absolute 0.0 0 -  0 K/uL   Immature Granulocytes 1 %   Abs Immature Granulocytes 0.08 (H) 0.00 - 0.07 K/uL  CBC     Status: Abnormal   Collection Time: 04/08/20  5:37 AM  Result Value Ref Range   WBC 10.0 4.0 - 10.5 K/uL   RBC 3.53 (L) 3.87 - 5.11 MIL/uL   Hemoglobin 12.2 12.0 - 15.0 g/dL   HCT 35.8 (L) 36 - 46 %   MCV 101.4 (H) 80.0 - 100.0 fL   MCH 34.6 (H) 26.0 - 34.0 pg   MCHC 34.1 30.0 - 36.0 g/dL   RDW 14.5 11.5 - 15.5 %   Platelets 65 (L) 150 - 400 K/uL   nRBC 0.0 0.0 - 0.2 %  Basic metabolic panel     Status: Abnormal   Collection Time: 04/08/20  5:37 AM  Result Value Ref Range   Sodium 140 135 - 145 mmol/L   Potassium 4.6 3.5 - 5.1 mmol/L   Chloride 104 98 - 111 mmol/L   CO2 25 22 - 32 mmol/L   Glucose, Bld 131 (H) 70 - 99 mg/dL   BUN 6 6 - 20 mg/dL   Creatinine, Ser 0.77 0.44 - 1.00 mg/dL   Calcium 8.5 (L) 8.9 - 10.3 mg/dL   GFR calc non Af Amer >60 >60 mL/min   GFR calc Af Amer >60 >60 mL/min   Anion gap 11 5 - 15    Assessment/Plan: 1 Day Post-Op       Procedure(s): EVACUATION HEMATOMA  AND APPLICATION OF PRESSURE DRESSING (N/A)  -currently stable. Plts 65 today, down from 99 on admission, prior to surgery. After discussion with her hematologist, we  will transfuse platelets 1 unit and give Nplate infusion today, with possible plan for discharge this afternoon. eccymosis edge is marked as of 9am this morning.  -postop: stable. Ecchymosis has extended to complete mons, but is not hard, hot or concerning. Packing removed with bleeding noted, Foley removed. Due to void.  - continue pressure on vulva and ice to area - continue norco as needed, currently q4-6 hrs. Hold NSAIDs - continue nicotine patch - recommend V2 vulvar compression device. $50 online   - will f/u with me and platelet count on Monday, in 72 hrs - will f/u with Dr. Grayland Ormond Hem next Friday - will continue to monitor for now  Benjaman Kindler, MD   LOS: 0 days   Benjaman Kindler 04/08/2020, 9:22 AM

## 2020-04-08 NOTE — Progress Notes (Signed)
[place]  [phone]) [phone] [phone]) [phone]  ID: Lin Landsman OB: 1970-12-22  MR#: [id number]  OYD#:[id number]  Patient Care Team: McLean-Scocuzza, Nino Glow, MD as PCP - General (Internal Medicine) Lloyd Huger, MD as Consulting Physician (Oncology)   CHIEF COMPLAINT: ITP.  INTERVAL HISTORY: Patient returns to clinic today for repeat laboratory work, further evaluation, and consideration of additional Nplate.  Patient is still visibly uncomfortable from her recent injury resulting in a labial laceration with significant bleed.  Patient required admission to the hospital and received Nplate as well as platelet transfusion at that time.  She denies any further bleeding.  She has no neurologic complaints.  She denies any recent fevers or illnesses.  She has a good appetite and denies weight loss.  She denies any chest pain, shortness of breath, cough, or hemoptysis. She denies any nausea, vomiting, constipation, or diarrhea.  She has no urinary complaints.  Patient offers no further specific complaints today.  REVIEW OF SYSTEMS:   Review of Systems  Constitutional: Negative.  Negative for fever, malaise/fatigue and weight loss.  Respiratory: Negative.  Negative for cough, hemoptysis and shortness of breath.   Cardiovascular: Negative.  Negative for chest pain and leg swelling.  Gastrointestinal: Negative.  Negative for abdominal pain, blood in stool and melena.  Genitourinary: Negative.  Negative for hematuria.  Musculoskeletal: Negative.  Negative for back pain.  Skin: Negative.  Negative for rash.  Neurological: Negative.  Negative for dizziness, focal weakness, weakness and headaches.  Endo/Heme/Allergies: Negative.  Does not bruise/bleed easily.  Psychiatric/Behavioral: Negative.  The patient is not nervous/anxious.     As per HPI. Otherwise, a complete review of systems is negative.  PAST MEDICAL HISTORY: Past Medical History:  Diagnosis Date     Anxiety    Chicken pox    Depression    Elevated testosterone level in female    Emphysema of lung ([place])    bronchitis   Frequent headaches    Hay fever    Hypertension     PAST SURGICAL HISTORY: Past Surgical History:  Procedure Laterality Date   HEMATOMA EVACUATION N/A 04/07/2020   Procedure: EVACUATION HEMATOMA  AND APPLICATION OF PRESSURE DRESSING;  Surgeon: Benjaman Kindler, MD;  Location: [facility] ORS;  Service: Gynecology;  Laterality: N/A;   HYSTEROSCOPY WITH NOVASURE N/A 02/21/2018   Procedure: HYSTEROSCOPY WITH NOVASURE, POLYPECTOMY;  Surgeon: Benjaman Kindler, MD;  Location: [facility] ORS;  Service: Gynecology;  Laterality: N/A;   TUBAL LIGATION  1997   TUBAL LIGATION      FAMILY HISTORY: Family History  Problem Relation Age of Onset   Hyperlipidemia Mother    Heart disease Mother    Stroke Mother    Depression Mother    Mental illness Mother    Heart attack Mother 75   Hyperlipidemia Father    Depression Father    Mental illness Father    Diabetes Paternal Grandfather    Cancer Cousin     ADVANCED DIRECTIVES (Y/N):  N  HEALTH MAINTENANCE: Social History   Tobacco Use   Smoking status: Current Every Day Smoker    Packs/day: 0.50    Years: 28.00    Pack years: 14.00    Types: Cigarettes   Smokeless tobacco: Never Used  Scientific laboratory technician Use: Never used  Substance Use Topics   Alcohol use: Yes    Alcohol/week: 8.0 standard drinks    Types: 8 Standard drinks or equivalent per week    Comment: Beer (Can)  Drug use: No    Comment: CBD oil     Colonoscopy:  PAP:  Bone density:  Lipid panel:  Allergies  Allergen Reactions   Meloxicam Nausea Only    Current Outpatient Medications  Medication Sig Dispense Refill   albuterol (PROVENTIL) (2.5 MG/3ML) 0.083% nebulizer solution Take 3 mLs (2.5 mg total) by nebulization every 6 (six) hours as needed for wheezing or shortness of breath. 360 mL 11   albuterol (VENTOLIN  HFA) 108 (90 Base) MCG/ACT inhaler Inhale into the lungs every 6 (six) hours as needed for wheezing or shortness of breath.     ALPRAZolam (XANAX) 0.25 MG tablet Take 1 tablet (0.25 mg total) by mouth daily as needed for anxiety. 30 tablet 2   amLODipine (NORVASC) 10 MG tablet Take 1 tablet (10 mg total) by mouth daily. 90 tablet 3   azithromycin (ZITHROMAX) 250 MG tablet 2 pills day 1 and 1 pill day 2-5 6 tablet 0   carvedilol (COREG) 25 MG tablet Take 1 tablet (25 mg total) by mouth 2 (two) times daily with a meal. 180 tablet 3   cetirizine (ZYRTEC) 10 MG tablet Take 1 tablet (10 mg total) by mouth daily. 90 tablet 3   Cholecalciferol 1.25 MG (50000 UT) capsule Take 1 capsule (50,000 Units total) by mouth every 30 (thirty) days. 1x per month note this 13 capsule 0   cyanocobalamin (,VITAMIN B-12,) 1000 MCG/ML injection 1 mL 1x per week x 4 weeks then every 30 days 8 mL 3   cyclobenzaprine (FLEXERIL) 10 MG tablet Take 1 tablet (10 mg total) by mouth 3 (three) times daily as needed for muscle spasms. 30 tablet 0   diclofenac Sodium (VOLTAREN) 1 % GEL Apply 2-4 g topically 4 (four) times daily. 2 grams upper body qid prn and 4 gram lower body qid prn 150 g 11   dicyclomine (BENTYL) 10 MG capsule Take 1 capsule (10 mg total) by mouth 3 (three) times daily as needed for spasms. Before food 90 capsule 2   escitalopram (LEXAPRO) 20 MG tablet Take 1 tablet (20 mg total) by mouth daily. 90 tablet 3   fluticasone (FLONASE) 50 MCG/ACT nasal spray Place 2 sprays into both nostrils daily. 16 g 11   HYDROcodone-acetaminophen (NORCO/VICODIN) 5-325 MG tablet Take 1-2 tablets by mouth every 4 (four) hours as needed for moderate pain. 30 tablet 0   mirabegron ER (MYRBETRIQ) 25 MG TB24 tablet Take 1 tablet (25 mg total) by mouth daily. 90 tablet 3   nicotine (NICODERM CQ - DOSED IN MG/24 HOURS) 21 mg/24hr patch Place 1 patch (21 mg total) onto the skin daily. 28 patch 0   nicotine polacrilex  (NICORETTE) 4 MG gum Take 1 each (4 mg total) by mouth as needed for smoking cessation. Chew 1 piece every 1-2 hours max 24 pieces per day x 1-6 weeks then week 7-9 every 2-4 hours and week 10-12 every 4-8 hours 300 tablet 5   ondansetron (ZOFRAN) 4 MG tablet Take 1 tablet (4 mg total) by mouth every 8 (eight) hours as needed for nausea or vomiting. 40 tablet 2   predniSONE (DELTASONE) 20 MG tablet Take 2 tablets (40 mg total) by mouth daily with breakfast. 14 tablet 0   spironolactone (ALDACTONE) 25 MG tablet Take 1 tablet (25 mg total) by mouth daily. In am 90 tablet 3   Syringe/Needle, Disp, 25G X 1-1/2" 1 ML MISC 1 Device by Does not apply route as directed. 30 each 11  Tiotropium Bromide Monohydrate (SPIRIVA RESPIMAT) 1.25 MCG/ACT AERS Inhale 2 puffs into the lungs daily. 4 g 11   traZODone (DESYREL) 50 MG tablet Take 0.5-1 tablets (25-50 mg total) by mouth at bedtime as needed for sleep. 90 tablet 3   budesonide-formoterol (SYMBICORT) 160-4.5 MCG/ACT inhaler Inhale 2 puffs into the lungs 2 (two) times daily. Rinse mouth (Patient not taking: Reported on 04/07/2020) 1 Inhaler 11   No current facility-administered medications for this visit.    OBJECTIVE: Vitals:   04/15/20 1341  BP: 133/79  Pulse: (!) 102  Resp: 20  Temp: 98 F (36.7 C)  SpO2: 100%     Body mass index is 33.48 kg/m.    ECOG FS:0 - Asymptomatic  General: Well-developed, well-nourished, no acute distress. Eyes: Pink conjunctiva, anicteric sclera. HEENT: Normocephalic, moist mucous membranes. Lungs: No audible wheezing or coughing. Heart: Regular rate and rhythm. Abdomen: Soft, nontender, no obvious distention. Musculoskeletal: No edema, cyanosis, or clubbing. Neuro: Alert, answering all questions appropriately. Cranial nerves grossly intact. Skin: No rashes or petechiae noted. Psych: Normal affect.   LAB RESULTS:  Lab Results  Component Value Date   NA 140 04/08/2020   K 4.6 04/08/2020   CL 104  04/08/2020   CO2 25 04/08/2020   GLUCOSE 131 (H) 04/08/2020   BUN 6 04/08/2020   CREATININE 0.77 04/08/2020   CALCIUM 8.5 (L) 04/08/2020   PROT 6.7 04/07/2020   ALBUMIN 3.8 04/07/2020   AST 52 (H) 04/07/2020   ALT 27 04/07/2020   ALKPHOS 58 04/07/2020   BILITOT 0.9 04/07/2020   GFRNONAA >60 04/08/2020   GFRAA >60 04/08/2020    Lab Results  Component Value Date   WBC 15.2 (H) 04/15/2020   NEUTROABS 11.2 (H) 04/15/2020   HGB 14.9 04/15/2020   HCT 43.1 04/15/2020   MCV 98.9 04/15/2020   PLT 280 04/15/2020     STUDIES: No results found.  ASSESSMENT: ITP  PLAN:    1.  ITP: Confirmed with normal bone marrow biopsy and normal FISH and cytogenetics on July 22, 2019.  Patient has a positive ANA which is likely clinically insignificant.  The remainder of her laboratory work was either negative or within normal limits. CT scan on March 04, 2017 did not reveal splenomegaly.  Previously, patient received 1 week of prednisone at 1 mg/kg dose and had no appreciable change in her platelet count.  Rituxan was denied by insurance.  Patient received 3 mcg/kg of Nplate approximately 1 week ago while in the hospital for her labial laceration.  She responded extremely well and her platelets are now 280.  She does not require treatment today.  Return to clinic in 4 weeks for further evaluation and continuation of treatment if needed.   2.  Positive ANA: Likely clinically insignificant.  Referral was previously sent to rheumatology. 3.  Labial laceration: Continue follow-up with OB/GYN as scheduled.  I spent a total of 20 minutes reviewing chart data, face-to-face evaluation with the patient, counseling and coordination of care as detailed above.   Patient expressed understanding and was in agreement with this plan. She also understands that She can call clinic at any time with any questions, concerns, or complaints.    Lloyd Huger, MD   04/15/2020 2:25 PM

## 2020-04-09 DIAGNOSIS — S3095XA Unspecified superficial injury of vagina and vulva, initial encounter: Secondary | ICD-10-CM | POA: Diagnosis not present

## 2020-04-09 DIAGNOSIS — X58XXXA Exposure to other specified factors, initial encounter: Secondary | ICD-10-CM | POA: Diagnosis not present

## 2020-04-09 DIAGNOSIS — S3023XA Contusion of vagina and vulva, initial encounter: Secondary | ICD-10-CM | POA: Diagnosis not present

## 2020-04-09 DIAGNOSIS — I10 Essential (primary) hypertension: Secondary | ICD-10-CM | POA: Diagnosis not present

## 2020-04-09 DIAGNOSIS — Z79899 Other long term (current) drug therapy: Secondary | ICD-10-CM | POA: Diagnosis not present

## 2020-04-09 DIAGNOSIS — F17210 Nicotine dependence, cigarettes, uncomplicated: Secondary | ICD-10-CM | POA: Diagnosis not present

## 2020-04-09 LAB — RHOGAM INJECTION: Unit division: 0

## 2020-04-09 LAB — CBC
HCT: 38.6 % (ref 36.0–46.0)
Hemoglobin: 12.5 g/dL (ref 12.0–15.0)
MCH: 33.6 pg (ref 26.0–34.0)
MCHC: 32.4 g/dL (ref 30.0–36.0)
MCV: 103.8 fL — ABNORMAL HIGH (ref 80.0–100.0)
Platelets: 80 10*3/uL — ABNORMAL LOW (ref 150–400)
RBC: 3.72 MIL/uL — ABNORMAL LOW (ref 3.87–5.11)
RDW: 14.4 % (ref 11.5–15.5)
WBC: 9.1 10*3/uL (ref 4.0–10.5)
nRBC: 0 % (ref 0.0–0.2)

## 2020-04-09 LAB — BPAM PLATELET PHERESIS
Blood Product Expiration Date: 202109252359
ISSUE DATE / TIME: 202109241246
Unit Type and Rh: 7300

## 2020-04-09 LAB — PREPARE PLATELET PHERESIS: Unit division: 0

## 2020-04-09 NOTE — Progress Notes (Signed)
Patient discharged to home. Medications/Prescriptions and discharge instructions reviewed with patient who verbalized understanding. Pt discharged with family via W/C. Will schedule follow-up as instructed. 

## 2020-04-11 ENCOUNTER — Ambulatory Visit: Payer: BC Managed Care – PPO | Attending: Internal Medicine

## 2020-04-11 DIAGNOSIS — D693 Immune thrombocytopenic purpura: Secondary | ICD-10-CM | POA: Diagnosis not present

## 2020-04-11 DIAGNOSIS — S3023XD Contusion of vagina and vulva, subsequent encounter: Secondary | ICD-10-CM | POA: Diagnosis not present

## 2020-04-15 ENCOUNTER — Encounter: Payer: Self-pay | Admitting: Oncology

## 2020-04-15 ENCOUNTER — Telehealth: Payer: BC Managed Care – PPO | Admitting: Family

## 2020-04-15 ENCOUNTER — Inpatient Hospital Stay: Payer: BC Managed Care – PPO | Attending: Oncology

## 2020-04-15 ENCOUNTER — Other Ambulatory Visit: Payer: Self-pay

## 2020-04-15 ENCOUNTER — Inpatient Hospital Stay (HOSPITAL_BASED_OUTPATIENT_CLINIC_OR_DEPARTMENT_OTHER): Payer: BC Managed Care – PPO | Admitting: Oncology

## 2020-04-15 ENCOUNTER — Inpatient Hospital Stay: Payer: BC Managed Care – PPO

## 2020-04-15 VITALS — BP 133/79 | HR 102 | Temp 98.0°F | Resp 20 | Wt 189.0 lb

## 2020-04-15 DIAGNOSIS — F17210 Nicotine dependence, cigarettes, uncomplicated: Secondary | ICD-10-CM | POA: Insufficient documentation

## 2020-04-15 DIAGNOSIS — S3141XA Laceration without foreign body of vagina and vulva, initial encounter: Secondary | ICD-10-CM | POA: Diagnosis not present

## 2020-04-15 DIAGNOSIS — Z809 Family history of malignant neoplasm, unspecified: Secondary | ICD-10-CM | POA: Diagnosis not present

## 2020-04-15 DIAGNOSIS — X58XXXA Exposure to other specified factors, initial encounter: Secondary | ICD-10-CM | POA: Insufficient documentation

## 2020-04-15 DIAGNOSIS — M5440 Lumbago with sciatica, unspecified side: Secondary | ICD-10-CM | POA: Diagnosis not present

## 2020-04-15 DIAGNOSIS — R760 Raised antibody titer: Secondary | ICD-10-CM | POA: Insufficient documentation

## 2020-04-15 DIAGNOSIS — D693 Immune thrombocytopenic purpura: Secondary | ICD-10-CM

## 2020-04-15 LAB — CBC WITH DIFFERENTIAL/PLATELET
Abs Immature Granulocytes: 0.09 10*3/uL — ABNORMAL HIGH (ref 0.00–0.07)
Basophils Absolute: 0.1 10*3/uL (ref 0.0–0.1)
Basophils Relative: 1 %
Eosinophils Absolute: 0.3 10*3/uL (ref 0.0–0.5)
Eosinophils Relative: 2 %
HCT: 43.1 % (ref 36.0–46.0)
Hemoglobin: 14.9 g/dL (ref 12.0–15.0)
Immature Granulocytes: 1 %
Lymphocytes Relative: 15 %
Lymphs Abs: 2.3 10*3/uL (ref 0.7–4.0)
MCH: 34.2 pg — ABNORMAL HIGH (ref 26.0–34.0)
MCHC: 34.6 g/dL (ref 30.0–36.0)
MCV: 98.9 fL (ref 80.0–100.0)
Monocytes Absolute: 1.3 10*3/uL — ABNORMAL HIGH (ref 0.1–1.0)
Monocytes Relative: 8 %
Neutro Abs: 11.2 10*3/uL — ABNORMAL HIGH (ref 1.7–7.7)
Neutrophils Relative %: 73 %
Platelets: 280 10*3/uL (ref 150–400)
RBC: 4.36 MIL/uL (ref 3.87–5.11)
RDW: 14.1 % (ref 11.5–15.5)
Smear Review: ADEQUATE
WBC: 15.2 10*3/uL — ABNORMAL HIGH (ref 4.0–10.5)
nRBC: 0 % (ref 0.0–0.2)

## 2020-04-15 MED ORDER — CYCLOBENZAPRINE HCL 10 MG PO TABS: 10.0000 mg | ORAL_TABLET | Freq: Three times a day (TID) | ORAL | 0 refills | Status: DC | PRN

## 2020-04-15 NOTE — Progress Notes (Signed)
Patient stated that she is having pain in lower abdominal due her recent fall.

## 2020-04-15 NOTE — Progress Notes (Signed)
We are sorry that you are not feeling well.  Here is how we plan to help!  Based on what you have shared with me it looks like you mostly have acute back pain.  Acute back pain is defined as musculoskeletal pain that can resolve in 1-3 weeks with conservative treatment.  I have prescribed Flexeril 10 mg every eight hours as needed which is a muscle relaxer  Please keep in mind that muscle relaxer's can cause fatigue and should not be taken while at work or driving.  Back pain is very common.  The pain often gets better over time.  The cause of back pain is usually not dangerous.  Most people can learn to manage their back pain on their own.  Home Care  Stay active.  Start with short walks on flat ground if you can.  Try to walk farther each day.  Do not sit, drive or stand in one place for more than 30 minutes.  Do not stay in bed.  Do not avoid exercise or work.  Activity can help your back heal faster.  Be careful when you bend or lift an object.  Bend at your knees, keep the object close to you, and do not twist.  Sleep on a firm mattress.  Lie on your side, and bend your knees.  If you lie on your back, put a pillow under your knees.  Only take medicines as told by your doctor.  Put ice on the injured area.  Put ice in a plastic bag  Place a towel between your skin and the bag  Leave the ice on for 15-20 minutes, 3-4 times a day for the first 2-3 days. 210 After that, you can switch between ice and heat packs.  Ask your doctor about back exercises or massage.  Avoid feeling anxious or stressed.  Find good ways to deal with stress, such as exercise.  Get Help Right Way If:  Your pain does not go away with rest or medicine.  Your pain does not go away in 1 week.  You have new problems.  You do not feel well.  The pain spreads into your legs.  You cannot control when you poop (bowel movement) or pee (urinate)  You feel sick to your stomach (nauseous) or throw up  (vomit)  You have belly (abdominal) pain.  You feel like you may pass out (faint).  If you develop a fever.  Make Sure you:  Understand these instructions.  Will watch your condition  Will get help right away if you are not doing well or get worse.  Your e-visit answers were reviewed by a board certified advanced clinical practitioner to complete your personal care plan.  Depending on the condition, your plan could have included both over the counter or prescription medications.  If there is a problem please reply  once you have received a response from your provider.  Your safety is important to [place].  If you have drug allergies check your prescription carefully.    You can use MyChart to ask questions about today's visit, request a non-urgent call back, or ask for a work or school excuse for 24 hours related to this e-Visit. If it has been greater than 24 hours you will need to follow up with your provider, or enter a new e-Visit to address those concerns.  You will get an e-mail in the next two days asking about your experience.  I hope that your e-visit has been  valuable and will speed your recovery. Thank you for using e-visits.  Greater than 5 minutes, yet less than 10 minutes of time have been spent researching, coordinating, and implementing care for this patient today.  Thank you for the details you included in the comment boxes. Those details are very helpful in determining the best course of treatment for you and help [place] to provide the best care.

## 2020-04-18 ENCOUNTER — Other Ambulatory Visit: Payer: Self-pay | Admitting: Internal Medicine

## 2020-04-18 DIAGNOSIS — R109 Unspecified abdominal pain: Secondary | ICD-10-CM

## 2020-04-18 MED ORDER — DICYCLOMINE HCL 10 MG PO CAPS: 10.0000 mg | ORAL_CAPSULE | Freq: Three times a day (TID) | ORAL | 3 refills | Status: DC | PRN

## 2020-04-21 ENCOUNTER — Encounter: Payer: Self-pay | Admitting: Internal Medicine

## 2020-04-22 DIAGNOSIS — M7751 Other enthesopathy of right foot: Secondary | ICD-10-CM | POA: Diagnosis not present

## 2020-04-22 DIAGNOSIS — M722 Plantar fascial fibromatosis: Secondary | ICD-10-CM | POA: Diagnosis not present

## 2020-04-22 DIAGNOSIS — M7731 Calcaneal spur, right foot: Secondary | ICD-10-CM | POA: Diagnosis not present

## 2020-04-28 ENCOUNTER — Ambulatory Visit (INDEPENDENT_AMBULATORY_CARE_PROVIDER_SITE_OTHER): Payer: BC Managed Care – PPO | Admitting: Pulmonary Disease

## 2020-04-28 ENCOUNTER — Encounter: Payer: Self-pay | Admitting: Pulmonary Disease

## 2020-04-28 ENCOUNTER — Other Ambulatory Visit: Payer: Self-pay

## 2020-04-28 VITALS — BP 136/90 | HR 95 | Temp 96.4°F | Ht 62.5 in | Wt 188.8 lb

## 2020-04-28 DIAGNOSIS — J449 Chronic obstructive pulmonary disease, unspecified: Secondary | ICD-10-CM | POA: Diagnosis not present

## 2020-04-28 DIAGNOSIS — R768 Other specified abnormal immunological findings in serum: Secondary | ICD-10-CM

## 2020-04-28 DIAGNOSIS — F17210 Nicotine dependence, cigarettes, uncomplicated: Secondary | ICD-10-CM

## 2020-04-28 DIAGNOSIS — Z8619 Personal history of other infectious and parasitic diseases: Secondary | ICD-10-CM | POA: Diagnosis not present

## 2020-04-28 DIAGNOSIS — R0602 Shortness of breath: Secondary | ICD-10-CM

## 2020-04-28 MED ORDER — TRELEGY ELLIPTA 200-62.5-25 MCG/INH IN AEPB: 1.0000 | INHALATION_SPRAY | Freq: Every day | RESPIRATORY_TRACT | 11 refills | Status: DC

## 2020-04-28 NOTE — Patient Instructions (Signed)
We are ordering breathing tests.  We discussed today the use of patches to quit smoking.  Continue Trelegy, we have increased the level of anti-inflammatory and it 1 puff daily.  Do not use Symbicort and Spiriva since you are on Trelegy.  We will see you in follow-up in 3 months time call sooner should any new problems arise.

## 2020-04-28 NOTE — Progress Notes (Signed)
Subjective:    Patient ID: Carly [name], female    DOB: [birth year], 49 y.o.   MRN: [id number]  HPI Carly [name] is a 49 year old current smoker (1.5 PPD) who presents for evaluation of COPD.  The patient is kindly referred by Dr. Olivia Mackie Mclean-Scocuzza.  Patient notes that she has been told approximately a year and a half ago that she had COPD.  She does not recall having pulmonary function test performed.  She has never seen a pulmonologist previously.  In August of this year she was admitted at [facility] with parainfluenza 3.  She notes that for approximately 1-1/2 years she has had issues with dyspnea particularly when walking long distances and when walking in the heat.  She notes that albuterol inhaler does help.  She had been on Symbicort and Spiriva however was given a trial of Trelegy Ellipta which she found helpful.  She has run however out of the sample medication.  She is currently smoking 1.5 packs/day as noted above out of "boredom" she had an injury and has been out of work.  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  She has however insight with regards to need to discontinue smoking and is precontemplative in this regard.  She has some issues with mild orthopnea and occasionally wakes up breathless in the middle of the night.  She does not have any lower extremity edema and has not had any chest pain.  Does occasionally endorse heartburn.  She has not had any fevers, chills or sweats since her illness in August.  She had a chest x-ray August 2021 when she was admitted to [facility] that showed asymmetric airspace opacities.  It was recommended that this be followed to resolution.  She has not had reimaging since then.  She has recently diagnosed ITP and a positive ANA of questionable significance.   Review of Systems A 10 point review of systems was performed and it is as noted above otherwise negative.  Past Medical History:  Diagnosis Date  . Anxiety   . Chicken pox   . Depression   . Elevated  testosterone level in female   . Emphysema of lung (HCC)    bronchitis  . Frequent headaches   . Hay fever   . Hypertension    Past Surgical History:  Procedure Laterality Date  . HEMATOMA EVACUATION N/A 04/07/2020   Procedure: EVACUATION HEMATOMA  AND APPLICATION OF PRESSURE DRESSING;  Surgeon: Benjaman Kindler, MD;  Location: [facility] ORS;  Service: Gynecology;  Laterality: N/A;  . HYSTEROSCOPY WITH NOVASURE N/A 02/21/2018   Procedure: HYSTEROSCOPY WITH NOVASURE, POLYPECTOMY;  Surgeon: Benjaman Kindler, MD;  Location: [facility] ORS;  Service: Gynecology;  Laterality: N/A;  . TUBAL LIGATION  1997  . TUBAL LIGATION     Family History  Problem Relation Age of Onset  . Hyperlipidemia Mother   . Heart disease Mother   . Stroke Mother   . Depression Mother   . Mental illness Mother   . Heart attack Mother 25  . Hyperlipidemia Father   . Depression Father   . Mental illness Father   . Diabetes Paternal Grandfather   . Cancer Cousin    Social History   Tobacco Use  . Smoking status: Current Every Day Smoker    Packs/day: 1.50    Years: 28.00    Pack years: 42.00    Types: Cigarettes  . Smokeless tobacco: Never Used  Substance Use Topics  . Alcohol use: Yes    Alcohol/week: 8.0  standard drinks    Types: 8 Standard drinks or equivalent per week    Comment: Beer (Can)    Allergies  Allergen Reactions  . Meloxicam Nausea Only   Current Meds  Medication Sig  . albuterol (PROVENTIL) (2.5 MG/3ML) 0.083% nebulizer solution Take 3 mLs (2.5 mg total) by nebulization every 6 (six) hours as needed for wheezing or shortness of breath.  Marland Kitchen albuterol (VENTOLIN HFA) 108 (90 Base) MCG/ACT inhaler Inhale into the lungs every 6 (six) hours as needed for wheezing or shortness of breath.  . ALPRAZolam (XANAX) 0.25 MG tablet Take 1 tablet (0.25 mg total) by mouth daily as needed for anxiety.  Marland Kitchen amLODipine (NORVASC) 10 MG tablet Take 1 tablet (10 mg total) by mouth daily.  . carvedilol (COREG) 25 MG  tablet Take 1 tablet (25 mg total) by mouth 2 (two) times daily with a meal.  . cetirizine (ZYRTEC) 10 MG tablet Take 1 tablet (10 mg total) by mouth daily.  . Cholecalciferol 1.25 MG (50000 UT) capsule Take 1 capsule (50,000 Units total) by mouth every 30 (thirty) days. 1x per month note this  . cyanocobalamin (,VITAMIN B-12,) 1000 MCG/ML injection 1 mL 1x per week x 4 weeks then every 30 days  . cyclobenzaprine (FLEXERIL) 10 MG tablet Take 1 tablet (10 mg total) by mouth 3 (three) times daily as needed for muscle spasms.  . diclofenac Sodium (VOLTAREN) 1 % GEL Apply 2-4 g topically 4 (four) times daily. 2 grams upper body qid prn and 4 gram lower body qid prn  . dicyclomine (BENTYL) 10 MG capsule Take 1 capsule (10 mg total) by mouth 3 (three) times daily as needed for spasms. Before food  . escitalopram (LEXAPRO) 20 MG tablet Take 1 tablet (20 mg total) by mouth daily.  . fluticasone (FLONASE) 50 MCG/ACT nasal spray Place 2 sprays into both nostrils daily.  Marland Kitchen HYDROcodone-acetaminophen (NORCO/VICODIN) 5-325 MG tablet Take 1-2 tablets by mouth every 4 (four) hours as needed for moderate pain.  . mirabegron ER (MYRBETRIQ) 25 MG TB24 tablet Take 1 tablet (25 mg total) by mouth daily.  . nicotine (NICODERM CQ - DOSED IN MG/24 HOURS) 21 mg/24hr patch Place 1 patch (21 mg total) onto the skin daily.  . nicotine polacrilex (NICORETTE) 4 MG gum Take 1 each (4 mg total) by mouth as needed for smoking cessation. Chew 1 piece every 1-2 hours max 24 pieces per day x 1-6 weeks then week 7-9 every 2-4 hours and week 10-12 every 4-8 hours  . ondansetron (ZOFRAN) 4 MG tablet Take 1 tablet (4 mg total) by mouth every 8 (eight) hours as needed for nausea or vomiting.  Marland Kitchen spironolactone (ALDACTONE) 25 MG tablet Take 1 tablet (25 mg total) by mouth daily. In am  . Syringe/Needle, Disp, 25G X 1-1/2" 1 ML MISC 1 Device by Does not apply route as directed.  . traZODone (DESYREL) 50 MG tablet Take 0.5-1 tablets (25-50 mg  total) by mouth at bedtime as needed for sleep.   Immunization History  Administered Date(s) Administered  . Influenza,inj,Quad PF,6+ Mos 08/23/2014  . Influenza-Unspecified 04/16/2019  . Tdap 08/23/2014       Objective:   Physical Exam BP 136/90 (BP Location: Left Arm, Patient Position: Sitting, Cuff Size: Normal)   Pulse 95   Temp (!) 96.4 F (35.8 C) (Temporal)   Ht 5' 2.5" (1.588 m)   Wt 188 lb 12.8 oz (85.6 kg)   SpO2 99%   BMI 33.98 kg/m  GENERAL:  Awake, well-developed, no acute distress, fully ambulatory.  Overweight. HEAD: Normocephalic, atraumatic.  EYES: Pupils equal, round, reactive to light.  No scleral icterus.  MOUTH: Nose/mouth/throat not examined due to masking requirements for COVID 19. NECK: Supple. No thyromegaly. Trachea midline. No JVD.  No adenopathy. PULMONARY: Good air entry bilaterally.  Coarse breath sounds bilaterally no other adventitious sounds. CARDIOVASCULAR: S1 and S2. Regular rate and rhythm.  No rubs, murmurs or gallops heard. ABDOMEN: Benign.   MUSCULOSKELETAL: No joint deformity, no clubbing, no edema.  NEUROLOGIC: No focal deficit, speech is fluent, no gait disturbance. SKIN: Intact,warm,dry.  Ecchymoses and petechiae upper extremities. PSYCH: Normal mood and behavior.     Assessment & Plan:     ICD-10-CM   1. Asthma-COPD overlap syndrome (HCC)  J44.9    We will obtain PFTs Trelegy 200/62.5/25, 1 puff daily Follow-up 3 months  2. Shortness of breath  R06.02    Likely related to the above  3. History of parainfluenza  Z86.19    Chest x-ray 2 confirm no residual from August infection  4. Tobacco dependence due to cigarettes  F17.210    Discussed strategies to quit Referred to "Become an Ex" website Recommend start with nicotine replacement patches   Orders Placed This Encounter  Procedures  . DG Chest 2 View    Standing Status:   Future    Standing Expiration Date:   10/27/2020    Order Specific Question:   Reason for Exam  (SYMPTOM  OR DIAGNOSIS REQUIRED)    Answer:   sob    Order Specific Question:   Preferred imaging location?    Answer:   [facility]  . Pulmonary Function Test [facility] Only    Standing Status:   Future    Standing Expiration Date:   04/28/2021    Order Specific Question:   Full PFT: includes the following: basic spirometry, spirometry pre & post bronchodilator, diffusion capacity (DLCO), lung volumes    Answer:   Full PFT   Meds ordered this encounter  Medications  . Fluticasone-Umeclidin-Vilant (TRELEGY ELLIPTA) 200-62.5-25 MCG/INH AEPB    Sig: Inhale 1 puff into the lungs daily.    Dispense:  28 each    Refill:  11    Discussion:  Patient likely has asthma COPD overlap syndrome.  She appears to note marked improvement with Trelegy.  Will increase Trelegy Ellipta to 200/62.5/25, 1 inhalation daily.  Discontinuing Symbicort and Spiriva.  We will obtain PFTs and follow-up in 3 months time.  She is to contact us prior to that time should any new difficulties arise.  She did have parainfluenza 3 in August will need follow-up chest x-ray to ensure changes noted at that time have cleared.  This has been ordered.  Renold Don, MD [facility]   *This note was dictated using voice recognition software/Dragon.  Despite best efforts to proofread, errors can occur which can change the meaning.  Any change was purely unintentional.

## 2020-05-02 ENCOUNTER — Telehealth: Payer: Self-pay

## 2020-05-02 DIAGNOSIS — E538 Deficiency of other specified B group vitamins: Secondary | ICD-10-CM

## 2020-05-02 MED ORDER — CYANOCOBALAMIN 1000 MCG/ML IJ SOLN: 1000.0000 ug | INTRAMUSCULAR | 1 refills | Status: DC

## 2020-05-02 NOTE — Telephone Encounter (Signed)
Medication sent in to preferred pharmacy per protocol.   

## 2020-05-02 NOTE — Telephone Encounter (Signed)
Pt needs refill on cyanocobalamin (,VITAMIN B-12,) 1000 MCG/ML injection sent to [facility] in [place]

## 2020-05-03 ENCOUNTER — Other Ambulatory Visit: Payer: Self-pay

## 2020-05-03 DIAGNOSIS — R110 Nausea: Secondary | ICD-10-CM

## 2020-05-03 MED ORDER — ONDANSETRON HCL 4 MG PO TABS: 4.0000 mg | ORAL_TABLET | Freq: Three times a day (TID) | ORAL | 2 refills | Status: DC | PRN

## 2020-05-09 DIAGNOSIS — S3023XD Contusion of vagina and vulva, subsequent encounter: Secondary | ICD-10-CM | POA: Diagnosis not present

## 2020-05-09 DIAGNOSIS — N9089 Other specified noninflammatory disorders of vulva and perineum: Secondary | ICD-10-CM | POA: Diagnosis not present

## 2020-05-10 ENCOUNTER — Telehealth: Payer: Self-pay | Admitting: Internal Medicine

## 2020-05-10 ENCOUNTER — Telehealth: Payer: BC Managed Care – PPO | Admitting: Physician Assistant

## 2020-05-10 DIAGNOSIS — K219 Gastro-esophageal reflux disease without esophagitis: Secondary | ICD-10-CM | POA: Diagnosis not present

## 2020-05-10 MED ORDER — OMEPRAZOLE 20 MG PO CPDR: 20.0000 mg | DELAYED_RELEASE_CAPSULE | Freq: Every day | ORAL | 0 refills | Status: DC

## 2020-05-10 NOTE — Telephone Encounter (Signed)
Rejection Reason - Patient was No Show - unable to R/S at this time" [facility] PA said on May 10, 2020 3:18 PM  Voicemail is full- unable to leave message for patient to call back to schedule -Raquel Sarna" [facility] PA said 2 months ago  "Rejection Reason - Patient did not respond" [facility] PA said about 2 hours ago  I spoke with about referral I advised pt to call and sch that the ofc is trying to reach her to sch. Pt understood.

## 2020-05-10 NOTE — Progress Notes (Signed)
We are sorry that you are not feeling well.  Here is how we plan to help!  Based on what you shared with me it looks like you most likely have Gastroesophageal Reflux Disease (GERD)  Gastroesophageal reflux disease (GERD) happens when acid from your stomach flows up into the esophagus.  When acid comes in contact with the esophagus, the acid causes sorenss (inflammation) in the esophagus.  Over time, GERD may create small holes (ulcers) in the lining of the esophagus.  I have prescribed Omeprazole 20 mg one by mouth daily until you follow up with a provider.  Your symptoms should improve in the next day or two.  You can use antacids as needed until symptoms resolve.  Call us if your heartburn worsens, you have trouble swallowing, weight loss, spitting up blood or recurrent vomiting.  Home Care:  May include lifestyle changes such as weight loss, quitting smoking and alcohol consumption  Avoid foods and drinks that make your symptoms worse, such as:  Caffeine or alcoholic drinks  Chocolate  Peppermint or mint flavorings  Garlic and onions  Spicy foods  Citrus fruits, such as oranges, lemons, or limes  Tomato-based foods such as sauce, chili, salsa and pizza  Fried and fatty foods  Avoid lying down for 3 hours prior to your bedtime or prior to taking a nap  Eat small, frequent meals instead of a large meals  Wear loose-fitting clothing.  Do not wear anything tight around your waist that causes pressure on your stomach.  Raise the head of your bed 6 to 8 inches with wood blocks to help you sleep.  Extra pillows will not help.  Seek Help Right Away If:  You have pain in your arms, neck, jaw, teeth or back  Your pain increases or changes in intensity or duration  You develop nausea, vomiting or sweating (diaphoresis)  You develop shortness of breath or you faint  Your vomit is green, yellow, black or looks like coffee grounds or blood  Your stool is red, bloody or  black  These symptoms could be signs of other problems, such as heart disease, gastric bleeding or esophageal bleeding.  Make sure you :  Understand these instructions.  Will watch your condition.  Will get help right away if you are not doing well or get worse.  Your e-visit answers were reviewed by a board certified advanced clinical practitioner to complete your personal care plan.  Depending on the condition, your plan could have included both over the counter or prescription medications.  If there is a problem please reply  once you have received a response from your provider.  Your safety is important to [place].  If you have drug allergies check your prescription carefully.    You can use MyChart to ask questions about today's visit, request a non-urgent call back, or ask for a work or school excuse for 24 hours related to this e-Visit. If it has been greater than 24 hours you will need to follow up with your provider, or enter a new e-Visit to address those concerns.  You will get an e-mail in the next two days asking about your experience.  I hope that your e-visit has been valuable and will speed your recovery. Thank you for using e-visits.  Greater than 5 minutes, yet less than 10 minutes of time have been spent researching, coordinating, and implementing care for this patient today

## 2020-05-14 NOTE — Progress Notes (Signed)
[place]  [phone] [phone]) [phone]  ID: Lin Landsman OB: 02/18/1971  MR#: [id number]  YWV#:[id number]  Patient Care Team: McLean-Scocuzza, Nino Glow, MD as PCP - General (Internal Medicine) Lloyd Huger, MD as Consulting Physician (Oncology)   CHIEF COMPLAINT: ITP.  INTERVAL HISTORY: Patient returns to clinic today for repeat laboratory work, further evaluation, and continuation of Nplate. She currently feels well and is asymptomatic. She denies any bleeding. She has no neurologic complaints.  She denies any recent fevers or illnesses.  She has a good appetite and denies weight loss.  She denies any chest pain, shortness of breath, cough, or hemoptysis. She denies any nausea, vomiting, constipation, or diarrhea.  She has no urinary complaints. Patient offers no specific complaints today.  REVIEW OF SYSTEMS:   Review of Systems  Constitutional: Negative.  Negative for fever, malaise/fatigue and weight loss.  Respiratory: Negative.  Negative for cough, hemoptysis and shortness of breath.   Cardiovascular: Negative.  Negative for chest pain and leg swelling.  Gastrointestinal: Negative.  Negative for abdominal pain, blood in stool and melena.  Genitourinary: Negative.  Negative for hematuria.  Musculoskeletal: Negative.  Negative for back pain.  Skin: Negative.  Negative for rash.  Neurological: Negative.  Negative for dizziness, focal weakness, weakness and headaches.  Endo/Heme/Allergies: Negative.  Does not bruise/bleed easily.  Psychiatric/Behavioral: Negative.  The patient is not nervous/anxious.     As per HPI. Otherwise, a complete review of systems is negative.  PAST MEDICAL HISTORY: Past Medical History:  Diagnosis Date  . Anxiety   . Chicken pox   . Depression   . Elevated testosterone level in female   . Emphysema of lung (HCC)    bronchitis  . Frequent headaches   . Hay fever   . Hypertension   . Idiopathic  thrombocytopenic purpura (ITP) (HCC)   . Positive ANA (antinuclear antibody)     PAST SURGICAL HISTORY: Past Surgical History:  Procedure Laterality Date  . HEMATOMA EVACUATION N/A 04/07/2020   Procedure: EVACUATION HEMATOMA  AND APPLICATION OF PRESSURE DRESSING;  Surgeon: Benjaman Kindler, MD;  Location: [facility] ORS;  Service: Gynecology;  Laterality: N/A;  . HYSTEROSCOPY WITH NOVASURE N/A 02/21/2018   Procedure: HYSTEROSCOPY WITH NOVASURE, POLYPECTOMY;  Surgeon: Benjaman Kindler, MD;  Location: [facility] ORS;  Service: Gynecology;  Laterality: N/A;  . TUBAL LIGATION  1997  . TUBAL LIGATION      FAMILY HISTORY: Family History  Problem Relation Age of Onset  . Hyperlipidemia Mother   . Heart disease Mother   . Stroke Mother   . Depression Mother   . Mental illness Mother   . Heart attack Mother 46  . Hyperlipidemia Father   . Depression Father   . Mental illness Father   . Diabetes Paternal Grandfather   . Cancer Cousin     ADVANCED DIRECTIVES (Y/N):  N  HEALTH MAINTENANCE: Social History   Tobacco Use  . Smoking status: Current Every Day Smoker    Packs/day: 1.50    Years: 28.00    Pack years: 42.00    Types: Cigarettes  . Smokeless tobacco: Never Used  Vaping Use  . Vaping Use: Never used  Substance Use Topics  . Alcohol use: Yes    Alcohol/week: 8.0 standard drinks    Types: 8 Standard drinks or equivalent per week    Comment: Beer (Can)   . Drug use: No    Comment: CBD oil     Colonoscopy:  PAP:  Bone  density:  Lipid panel:  Allergies  Allergen Reactions  . Meloxicam Nausea Only    Current Outpatient Medications  Medication Sig Dispense Refill  . albuterol (PROVENTIL) (2.5 MG/3ML) 0.083% nebulizer solution Take 3 mLs (2.5 mg total) by nebulization every 6 (six) hours as needed for wheezing or shortness of breath. 360 mL 11  . albuterol (VENTOLIN HFA) 108 (90 Base) MCG/ACT inhaler Inhale into the lungs every 6 (six) hours as needed for wheezing or shortness  of breath.    . ALPRAZolam (XANAX) 0.25 MG tablet Take 1 tablet (0.25 mg total) by mouth daily as needed for anxiety. 30 tablet 2  . amLODipine (NORVASC) 10 MG tablet Take 1 tablet (10 mg total) by mouth daily. 90 tablet 3  . azithromycin (ZITHROMAX) 250 MG tablet 2 pills day 1 and 1 pill day 2-5 6 tablet 0  . budesonide-formoterol (SYMBICORT) 160-4.5 MCG/ACT inhaler Inhale 2 puffs into the lungs 2 (two) times daily. Rinse mouth 1 Inhaler 11  . carvedilol (COREG) 25 MG tablet Take 1 tablet (25 mg total) by mouth 2 (two) times daily with a meal. 180 tablet 3  . cetirizine (ZYRTEC) 10 MG tablet Take 1 tablet (10 mg total) by mouth daily. 90 tablet 3  . Cholecalciferol 1.25 MG (50000 UT) capsule Take 1 capsule (50,000 Units total) by mouth every 30 (thirty) days. 1x per month note this 13 capsule 0  . cyanocobalamin (,VITAMIN B-12,) 1000 MCG/ML injection Inject 1 mL (1,000 mcg total) into the muscle every 30 (thirty) days. 8 mL 1  . cyclobenzaprine (FLEXERIL) 10 MG tablet Take 1 tablet (10 mg total) by mouth 3 (three) times daily as needed for muscle spasms. 30 tablet 0  . diclofenac Sodium (VOLTAREN) 1 % GEL Apply 2-4 g topically 4 (four) times daily. 2 grams upper body qid prn and 4 gram lower body qid prn 150 g 11  . dicyclomine (BENTYL) 10 MG capsule Take 1 capsule (10 mg total) by mouth 3 (three) times daily as needed for spasms. Before food 270 capsule 3  . escitalopram (LEXAPRO) 20 MG tablet Take 1 tablet (20 mg total) by mouth daily. 90 tablet 3  . fluticasone (FLONASE) 50 MCG/ACT nasal spray Place 2 sprays into both nostrils daily. 16 g 11  . Fluticasone-Umeclidin-Vilant (TRELEGY ELLIPTA) 200-62.5-25 MCG/INH AEPB Inhale 1 puff into the lungs daily. 28 each 11  . HYDROcodone-acetaminophen (NORCO/VICODIN) 5-325 MG tablet Take 1-2 tablets by mouth every 4 (four) hours as needed for moderate pain. 30 tablet 0  . mirabegron ER (MYRBETRIQ) 25 MG TB24 tablet Take 1 tablet (25 mg total) by mouth daily.  90 tablet 3  . nicotine (NICODERM CQ - DOSED IN MG/24 HOURS) 21 mg/24hr patch Place 1 patch (21 mg total) onto the skin daily. 28 patch 0  . nicotine polacrilex (NICORETTE) 4 MG gum Take 1 each (4 mg total) by mouth as needed for smoking cessation. Chew 1 piece every 1-2 hours max 24 pieces per day x 1-6 weeks then week 7-9 every 2-4 hours and week 10-12 every 4-8 hours 300 tablet 5  . omeprazole (PRILOSEC) 20 MG capsule Take 1 capsule (20 mg total) by mouth daily. 30 capsule 0  . ondansetron (ZOFRAN) 4 MG tablet Take 1 tablet (4 mg total) by mouth every 8 (eight) hours as needed for nausea or vomiting. 40 tablet 2  . predniSONE (DELTASONE) 20 MG tablet Take 2 tablets (40 mg total) by mouth daily with breakfast. 14 tablet 0  . spironolactone (  ALDACTONE) 25 MG tablet Take 1 tablet (25 mg total) by mouth daily. In am 90 tablet 3  . Syringe/Needle, Disp, 25G X 1-1/2" 1 ML MISC 1 Device by Does not apply route as directed. 30 each 11  . Tiotropium Bromide Monohydrate (SPIRIVA RESPIMAT) 1.25 MCG/ACT AERS Inhale 2 puffs into the lungs daily. 4 g 11  . traZODone (DESYREL) 50 MG tablet Take 0.5-1 tablets (25-50 mg total) by mouth at bedtime as needed for sleep. 90 tablet 3   No current facility-administered medications for this visit.    OBJECTIVE: Vitals:   05/16/20 1333  BP: 128/86  Pulse: (!) 102  Resp: 20  Temp: 98.5 F (36.9 C)  SpO2: 98%     Body mass index is 33.98 kg/m.    ECOG FS:0 - Asymptomatic  General: Well-developed, well-nourished, no acute distress. Eyes: Pink conjunctiva, anicteric sclera. HEENT: Normocephalic, moist mucous membranes. Lungs: No audible wheezing or coughing. Heart: Regular rate and rhythm. Abdomen: Soft, nontender, no obvious distention. Musculoskeletal: No edema, cyanosis, or clubbing. Neuro: Alert, answering all questions appropriately. Cranial nerves grossly intact. Skin: No rashes or petechiae noted. Psych: Normal affect.   LAB RESULTS:  Lab  Results  Component Value Date   NA 140 04/08/2020   K 4.6 04/08/2020   CL 104 04/08/2020   CO2 25 04/08/2020   GLUCOSE 131 (H) 04/08/2020   BUN 6 04/08/2020   CREATININE 0.77 04/08/2020   CALCIUM 8.5 (L) 04/08/2020   PROT 6.7 04/07/2020   ALBUMIN 3.8 04/07/2020   AST 52 (H) 04/07/2020   ALT 27 04/07/2020   ALKPHOS 58 04/07/2020   BILITOT 0.9 04/07/2020   GFRNONAA >60 04/08/2020   GFRAA >60 04/08/2020    Lab Results  Component Value Date   WBC 7.8 05/16/2020   NEUTROABS 4.6 05/16/2020   HGB 15.2 (H) 05/16/2020   HCT 45.2 05/16/2020   MCV 102.0 (H) 05/16/2020   PLT 25 (LL) 05/16/2020     STUDIES: No results found.  ASSESSMENT: ITP  PLAN:    1.  ITP: Confirmed with normal bone marrow biopsy and normal FISH and cytogenetics on July 22, 2019.  Patient has a positive ANA which is likely clinically insignificant.  The remainder of her laboratory work was either negative or within normal limits. CT scan on March 04, 2017 did not reveal splenomegaly.  Previously, patient received 1 week of prednisone at 1 mg/kg dose and had no appreciable change in her platelet count.  Rituxan was denied by insurance.  Patient received 3 mcg/kg of Nplate 5 weeks ago increasing her platelet count to 280. It has now decreased to 25 therefore will proceed with treatment today. Return to clinic in 2 weeks for laboratory work and Nplate if her platelets fall below 100. Patient will then return to clinic in 4 weeks for repeat laboratory work, further evaluation, and continuation of treatment if needed.  2.  Positive ANA: Likely clinically insignificant.  Referral was previously sent to rheumatology. 3.  Labial laceration: Resolved.  I spent a total of 30 minutes reviewing chart data, face-to-face evaluation with the patient, counseling and coordination of care as detailed above.    Patient expressed understanding and was in agreement with this plan. She also understands that She can call clinic at  any time with any questions, concerns, or complaints.    Lloyd Huger, MD   05/17/2020 6:40 AM

## 2020-05-16 ENCOUNTER — Other Ambulatory Visit: Payer: Self-pay

## 2020-05-16 ENCOUNTER — Inpatient Hospital Stay (HOSPITAL_BASED_OUTPATIENT_CLINIC_OR_DEPARTMENT_OTHER): Payer: BC Managed Care – PPO | Admitting: Oncology

## 2020-05-16 ENCOUNTER — Inpatient Hospital Stay: Payer: BC Managed Care – PPO

## 2020-05-16 ENCOUNTER — Inpatient Hospital Stay: Payer: BC Managed Care – PPO | Attending: Oncology

## 2020-05-16 ENCOUNTER — Encounter: Payer: Self-pay | Admitting: Oncology

## 2020-05-16 VITALS — BP 128/86 | HR 102 | Temp 98.5°F | Resp 20 | Wt 188.8 lb

## 2020-05-16 DIAGNOSIS — D693 Immune thrombocytopenic purpura: Secondary | ICD-10-CM | POA: Diagnosis not present

## 2020-05-16 LAB — CBC WITH DIFFERENTIAL/PLATELET
Abs Immature Granulocytes: 0.03 10*3/uL (ref 0.00–0.07)
Basophils Absolute: 0.1 10*3/uL (ref 0.0–0.1)
Basophils Relative: 1 %
Eosinophils Absolute: 0.2 10*3/uL (ref 0.0–0.5)
Eosinophils Relative: 2 %
HCT: 45.2 % (ref 36.0–46.0)
Hemoglobin: 15.2 g/dL — ABNORMAL HIGH (ref 12.0–15.0)
Immature Granulocytes: 0 %
Lymphocytes Relative: 29 %
Lymphs Abs: 2.3 10*3/uL (ref 0.7–4.0)
MCH: 34.3 pg — ABNORMAL HIGH (ref 26.0–34.0)
MCHC: 33.6 g/dL (ref 30.0–36.0)
MCV: 102 fL — ABNORMAL HIGH (ref 80.0–100.0)
Monocytes Absolute: 0.7 10*3/uL (ref 0.1–1.0)
Monocytes Relative: 9 %
Neutro Abs: 4.6 10*3/uL (ref 1.7–7.7)
Neutrophils Relative %: 59 %
Platelets: 25 10*3/uL — CL (ref 150–400)
RBC: 4.43 MIL/uL (ref 3.87–5.11)
RDW: 13.9 % (ref 11.5–15.5)
WBC: 7.8 10*3/uL (ref 4.0–10.5)
nRBC: 0 % (ref 0.0–0.2)

## 2020-05-16 MED ORDER — ROMIPLOSTIM 250 MCG ~~LOC~~ SOLR
250.0000 ug | Freq: Once | SUBCUTANEOUS | Status: AC
  Administered 2020-05-16: 250 ug via SUBCUTANEOUS
  Filled 2020-05-16: qty 0.5

## 2020-05-16 NOTE — Progress Notes (Signed)
Plts 25. Per MD keep current dose at 53mcg/kg

## 2020-05-30 ENCOUNTER — Other Ambulatory Visit: Payer: Self-pay | Admitting: Internal Medicine

## 2020-05-30 ENCOUNTER — Inpatient Hospital Stay: Payer: BC Managed Care – PPO

## 2020-05-30 ENCOUNTER — Other Ambulatory Visit: Payer: Self-pay

## 2020-05-30 DIAGNOSIS — E559 Vitamin D deficiency, unspecified: Secondary | ICD-10-CM

## 2020-05-30 DIAGNOSIS — D693 Immune thrombocytopenic purpura: Secondary | ICD-10-CM

## 2020-05-30 DIAGNOSIS — F32A Depression, unspecified: Secondary | ICD-10-CM

## 2020-05-30 LAB — CBC WITH DIFFERENTIAL/PLATELET
Abs Immature Granulocytes: 0.05 10*3/uL (ref 0.00–0.07)
Basophils Absolute: 0.1 10*3/uL (ref 0.0–0.1)
Basophils Relative: 1 %
Eosinophils Absolute: 0.2 10*3/uL (ref 0.0–0.5)
Eosinophils Relative: 1 %
HCT: 45.8 % (ref 36.0–46.0)
Hemoglobin: 16.1 g/dL — ABNORMAL HIGH (ref 12.0–15.0)
Immature Granulocytes: 0 %
Lymphocytes Relative: 26 %
Lymphs Abs: 3.1 10*3/uL (ref 0.7–4.0)
MCH: 35.5 pg — ABNORMAL HIGH (ref 26.0–34.0)
MCHC: 35.2 g/dL (ref 30.0–36.0)
MCV: 100.9 fL — ABNORMAL HIGH (ref 80.0–100.0)
Monocytes Absolute: 0.9 10*3/uL (ref 0.1–1.0)
Monocytes Relative: 8 %
Neutro Abs: 7.7 10*3/uL (ref 1.7–7.7)
Neutrophils Relative %: 64 %
Platelets: 127 10*3/uL — ABNORMAL LOW (ref 150–400)
RBC: 4.54 MIL/uL (ref 3.87–5.11)
RDW: 15.6 % — ABNORMAL HIGH (ref 11.5–15.5)
WBC: 12.1 10*3/uL — ABNORMAL HIGH (ref 4.0–10.5)
nRBC: 0 % (ref 0.0–0.2)

## 2020-05-30 MED ORDER — ROMIPLOSTIM 250 MCG ~~LOC~~ SOLR
250.0000 ug | Freq: Once | SUBCUTANEOUS | Status: AC
  Administered 2020-05-30: 250 ug via SUBCUTANEOUS
  Filled 2020-05-30: qty 0.5

## 2020-05-30 MED ORDER — ALPRAZOLAM 0.25 MG PO TABS: 0.2500 mg | ORAL_TABLET | Freq: Every day | ORAL | 2 refills | Status: DC | PRN

## 2020-05-31 MED ORDER — CHOLECALCIFEROL 1.25 MG (50000 UT) PO CAPS: 50000.0000 [IU] | ORAL_CAPSULE | ORAL | 0 refills | Status: DC

## 2020-06-08 NOTE — Progress Notes (Deleted)
[place]  [phone]) [phone] Fax:(336) [phone]  ID: Carly [name] OB: 06-03-1971  MR#: [id number]  AOZ#:[id number]  Patient Care Team: McLean-Scocuzza, Carly Glow, MD as PCP - General (Internal Medicine) Carly Huger, MD as Consulting Physician (Oncology)   CHIEF COMPLAINT: ITP.  INTERVAL HISTORY: Patient returns to clinic today for repeat laboratory work, further evaluation, and continuation of Nplate. She currently feels well and is asymptomatic. She denies any bleeding. She has no neurologic complaints.  She denies any recent fevers or illnesses.  She has a good appetite and denies weight loss.  She denies any chest pain, shortness of breath, cough, or hemoptysis. She denies any nausea, vomiting, constipation, or diarrhea.  She has no urinary complaints. Patient offers no specific complaints today.  REVIEW OF SYSTEMS:   Review of Systems  Constitutional: Negative.  Negative for fever, malaise/fatigue and weight loss.  Respiratory: Negative.  Negative for cough, hemoptysis and shortness of breath.   Cardiovascular: Negative.  Negative for chest pain and leg swelling.  Gastrointestinal: Negative.  Negative for abdominal pain, blood in stool and melena.  Genitourinary: Negative.  Negative for hematuria.  Musculoskeletal: Negative.  Negative for back pain.  Skin: Negative.  Negative for rash.  Neurological: Negative.  Negative for dizziness, focal weakness, weakness and headaches.  Endo/Heme/Allergies: Negative.  Does not bruise/bleed easily.  Psychiatric/Behavioral: Negative.  The patient is not nervous/anxious.     As per HPI. Otherwise, a complete review of systems is negative.  PAST MEDICAL HISTORY: Past Medical History:  Diagnosis Date  . Anxiety   . Chicken pox   . Depression   . Elevated testosterone level in female   . Emphysema of lung (HCC)    bronchitis  . Frequent headaches   . Hay fever   . Hypertension   . Idiopathic  thrombocytopenic purpura (ITP) (HCC)   . Positive ANA (antinuclear antibody)     PAST SURGICAL HISTORY: Past Surgical History:  Procedure Laterality Date  . HEMATOMA EVACUATION N/A 04/07/2020   Procedure: EVACUATION HEMATOMA  AND APPLICATION OF PRESSURE DRESSING;  Surgeon: Carly Kindler, MD;  Location: [facility] ORS;  Service: Gynecology;  Laterality: N/A;  . HYSTEROSCOPY WITH NOVASURE N/A 02/21/2018   Procedure: HYSTEROSCOPY WITH NOVASURE, POLYPECTOMY;  Surgeon: Carly Kindler, MD;  Location: [facility] ORS;  Service: Gynecology;  Laterality: N/A;  . TUBAL LIGATION  1997  . TUBAL LIGATION      FAMILY HISTORY: Family History  Problem Relation Age of Onset  . Hyperlipidemia Mother   . Heart disease Mother   . Stroke Mother   . Depression Mother   . Mental illness Mother   . Heart attack Mother 41  . Hyperlipidemia Father   . Depression Father   . Mental illness Father   . Diabetes Paternal Grandfather   . Cancer Cousin     ADVANCED DIRECTIVES (Y/N):  N  HEALTH MAINTENANCE: Social History   Tobacco Use  . Smoking status: Current Every Day Smoker    Packs/day: 1.50    Years: 28.00    Pack years: 42.00    Types: Cigarettes  . Smokeless tobacco: Never Used  Vaping Use  . Vaping Use: Never used  Substance Use Topics  . Alcohol use: Yes    Alcohol/week: 8.0 standard drinks    Types: 8 Standard drinks or equivalent per week    Comment: Beer (Can)   . Drug use: No    Comment: CBD oil     Colonoscopy:  PAP:  Bone  density:  Lipid panel:  Allergies  Allergen Reactions  . Meloxicam Nausea Only    Current Outpatient Medications  Medication Sig Dispense Refill  . albuterol (PROVENTIL) (2.5 MG/3ML) 0.083% nebulizer solution Take 3 mLs (2.5 mg total) by nebulization every 6 (six) hours as needed for wheezing or shortness of breath. 360 mL 11  . albuterol (VENTOLIN HFA) 108 (90 Base) MCG/ACT inhaler Inhale into the lungs every 6 (six) hours as needed for wheezing or shortness  of breath.    . ALPRAZolam (XANAX) 0.25 MG tablet Take 1 tablet (0.25 mg total) by mouth daily as needed for anxiety. 30 tablet 2  . amLODipine (NORVASC) 10 MG tablet Take 1 tablet (10 mg total) by mouth daily. 90 tablet 3  . azithromycin (ZITHROMAX) 250 MG tablet 2 pills day 1 and 1 pill day 2-5 6 tablet 0  . budesonide-formoterol (SYMBICORT) 160-4.5 MCG/ACT inhaler Inhale 2 puffs into the lungs 2 (two) times daily. Rinse mouth 1 Inhaler 11  . carvedilol (COREG) 25 MG tablet Take 1 tablet (25 mg total) by mouth 2 (two) times daily with a meal. 180 tablet 3  . cetirizine (ZYRTEC) 10 MG tablet Take 1 tablet (10 mg total) by mouth daily. 90 tablet 3  . Cholecalciferol 1.25 MG (50000 UT) capsule Take 1 capsule (50,000 Units total) by mouth every 30 (thirty) days. 1x per month note this 13 capsule 0  . cyanocobalamin (,VITAMIN B-12,) 1000 MCG/ML injection Inject 1 mL (1,000 mcg total) into the muscle every 30 (thirty) days. 8 mL 1  . cyclobenzaprine (FLEXERIL) 10 MG tablet Take 1 tablet (10 mg total) by mouth 3 (three) times daily as needed for muscle spasms. 30 tablet 0  . diclofenac Sodium (VOLTAREN) 1 % GEL Apply 2-4 g topically 4 (four) times daily. 2 grams upper body qid prn and 4 gram lower body qid prn 150 g 11  . dicyclomine (BENTYL) 10 MG capsule Take 1 capsule (10 mg total) by mouth 3 (three) times daily as needed for spasms. Before food 270 capsule 3  . escitalopram (LEXAPRO) 20 MG tablet Take 1 tablet (20 mg total) by mouth daily. 90 tablet 3  . fluticasone (FLONASE) 50 MCG/ACT nasal spray Place 2 sprays into both nostrils daily. 16 g 11  . Fluticasone-Umeclidin-Vilant (TRELEGY ELLIPTA) 200-62.5-25 MCG/INH AEPB Inhale 1 puff into the lungs daily. 28 each 11  . HYDROcodone-acetaminophen (NORCO/VICODIN) 5-325 MG tablet Take 1-2 tablets by mouth every 4 (four) hours as needed for moderate pain. 30 tablet 0  . mirabegron ER (MYRBETRIQ) 25 MG TB24 tablet Take 1 tablet (25 mg total) by mouth daily.  90 tablet 3  . nicotine (NICODERM CQ - DOSED IN MG/24 HOURS) 21 mg/24hr patch Place 1 patch (21 mg total) onto the skin daily. 28 patch 0  . nicotine polacrilex (NICORETTE) 4 MG gum Take 1 each (4 mg total) by mouth as needed for smoking cessation. Chew 1 piece every 1-2 hours max 24 pieces per day x 1-6 weeks then week 7-9 every 2-4 hours and week 10-12 every 4-8 hours 300 tablet 5  . omeprazole (PRILOSEC) 20 MG capsule Take 1 capsule (20 mg total) by mouth daily. 30 capsule 0  . ondansetron (ZOFRAN) 4 MG tablet Take 1 tablet (4 mg total) by mouth every 8 (eight) hours as needed for nausea or vomiting. 40 tablet 2  . predniSONE (DELTASONE) 20 MG tablet Take 2 tablets (40 mg total) by mouth daily with breakfast. 14 tablet 0  . spironolactone (  ALDACTONE) 25 MG tablet Take 1 tablet (25 mg total) by mouth daily. In am 90 tablet 3  . Syringe/Needle, Disp, 25G X 1-1/2" 1 ML MISC 1 Device by Does not apply route as directed. 30 each 11  . Tiotropium Bromide Monohydrate (SPIRIVA RESPIMAT) 1.25 MCG/ACT AERS Inhale 2 puffs into the lungs daily. 4 g 11  . traZODone (DESYREL) 50 MG tablet Take 0.5-1 tablets (25-50 mg total) by mouth at bedtime as needed for sleep. 90 tablet 3   No current facility-administered medications for this visit.    OBJECTIVE: There were no vitals filed for this visit.   There is no height or weight on file to calculate BMI.    ECOG FS:0 - Asymptomatic  General: Well-developed, well-nourished, no acute distress. Eyes: Pink conjunctiva, anicteric sclera. HEENT: Normocephalic, moist mucous membranes. Lungs: No audible wheezing or coughing. Heart: Regular rate and rhythm. Abdomen: Soft, nontender, no obvious distention. Musculoskeletal: No edema, cyanosis, or clubbing. Neuro: Alert, answering all questions appropriately. Cranial nerves grossly intact. Skin: No rashes or petechiae noted. Psych: Normal affect.   LAB RESULTS:  Lab Results  Component Value Date   NA 140  04/08/2020   K 4.6 04/08/2020   CL 104 04/08/2020   CO2 25 04/08/2020   GLUCOSE 131 (H) 04/08/2020   BUN 6 04/08/2020   CREATININE 0.77 04/08/2020   CALCIUM 8.5 (L) 04/08/2020   PROT 6.7 04/07/2020   ALBUMIN 3.8 04/07/2020   AST 52 (H) 04/07/2020   ALT 27 04/07/2020   ALKPHOS 58 04/07/2020   BILITOT 0.9 04/07/2020   GFRNONAA >60 04/08/2020   GFRAA >60 04/08/2020    Lab Results  Component Value Date   WBC 12.1 (H) 05/30/2020   NEUTROABS 7.7 05/30/2020   HGB 16.1 (H) 05/30/2020   HCT 45.8 05/30/2020   MCV 100.9 (H) 05/30/2020   PLT 127 (L) 05/30/2020     STUDIES: No results found.  ASSESSMENT: ITP  PLAN:    1.  ITP: Confirmed with normal bone marrow biopsy and normal FISH and cytogenetics on July 22, 2019.  Patient has a positive ANA which is likely clinically insignificant.  The remainder of her laboratory work was either negative or within normal limits. CT scan on March 04, 2017 did not reveal splenomegaly.  Previously, patient received 1 week of prednisone at 1 mg/kg dose and had no appreciable change in her platelet count.  Rituxan was denied by insurance.  Patient received 3 mcg/kg of Nplate 5 weeks ago increasing her platelet count to 280. It has now decreased to 25 therefore will proceed with treatment today. Return to clinic in 2 weeks for laboratory work and Nplate if her platelets fall below 100. Patient will then return to clinic in 4 weeks for repeat laboratory work, further evaluation, and continuation of treatment if needed.  2.  Positive ANA: Likely clinically insignificant.  Referral was previously sent to rheumatology. 3.  Labial laceration: Resolved.  I spent a total of 30 minutes reviewing chart data, face-to-face evaluation with the patient, counseling and coordination of care as detailed above.    Patient expressed understanding and was in agreement with this plan. She also understands that She can call clinic at any time with any questions,  concerns, or complaints.    Carly Huger, MD   06/08/2020 10:33 AM

## 2020-06-13 ENCOUNTER — Inpatient Hospital Stay: Payer: BC Managed Care – PPO

## 2020-06-13 ENCOUNTER — Inpatient Hospital Stay: Payer: BC Managed Care – PPO | Admitting: Oncology

## 2020-06-13 DIAGNOSIS — D693 Immune thrombocytopenic purpura: Secondary | ICD-10-CM

## 2020-06-22 ENCOUNTER — Inpatient Hospital Stay: Payer: BC Managed Care – PPO

## 2020-06-22 ENCOUNTER — Encounter: Payer: Self-pay | Admitting: Oncology

## 2020-06-22 ENCOUNTER — Inpatient Hospital Stay: Payer: BC Managed Care – PPO | Attending: Oncology | Admitting: Oncology

## 2020-06-22 VITALS — BP 129/60 | HR 80 | Temp 99.3°F | Resp 18 | Wt 195.4 lb

## 2020-06-22 DIAGNOSIS — D693 Immune thrombocytopenic purpura: Secondary | ICD-10-CM

## 2020-06-22 LAB — CBC WITH DIFFERENTIAL/PLATELET
Abs Immature Granulocytes: 0.02 10*3/uL (ref 0.00–0.07)
Basophils Absolute: 0.1 10*3/uL (ref 0.0–0.1)
Basophils Relative: 1 %
Eosinophils Absolute: 0.2 10*3/uL (ref 0.0–0.5)
Eosinophils Relative: 2 %
HCT: 45 % (ref 36.0–46.0)
Hemoglobin: 15.5 g/dL — ABNORMAL HIGH (ref 12.0–15.0)
Immature Granulocytes: 0 %
Lymphocytes Relative: 22 %
Lymphs Abs: 1.9 10*3/uL (ref 0.7–4.0)
MCH: 35.3 pg — ABNORMAL HIGH (ref 26.0–34.0)
MCHC: 34.4 g/dL (ref 30.0–36.0)
MCV: 102.5 fL — ABNORMAL HIGH (ref 80.0–100.0)
Monocytes Absolute: 0.8 10*3/uL (ref 0.1–1.0)
Monocytes Relative: 10 %
Neutro Abs: 5.6 10*3/uL (ref 1.7–7.7)
Neutrophils Relative %: 65 %
Platelets: 45 10*3/uL — ABNORMAL LOW (ref 150–400)
RBC: 4.39 MIL/uL (ref 3.87–5.11)
RDW: 13.5 % (ref 11.5–15.5)
WBC: 8.6 10*3/uL (ref 4.0–10.5)
nRBC: 0 % (ref 0.0–0.2)

## 2020-06-22 MED ORDER — ROMIPLOSTIM INJECTION 500 MCG
3.0000 ug/kg | Freq: Once | SUBCUTANEOUS | Status: AC
  Administered 2020-06-22: 265 ug via SUBCUTANEOUS
  Filled 2020-06-22: qty 0.53

## 2020-06-22 NOTE — Progress Notes (Signed)
[place]  [phone]) [phone] Fax:(336) [phone]  ID: Carly [name] OB: [birth year]  MR#: [id number]  JGO#:[id number]  Patient Care Team: McLean-Scocuzza, Nino Glow, MD as PCP - General (Internal Medicine) Lloyd Huger, MD as Consulting Physician (Oncology)   CHIEF COMPLAINT: ITP.  INTERVAL HISTORY: Patient returns to clinic today for repeat laboratory work, further evaluation, and continuation of Nplate. She currently feels well and is asymptomatic. She denies any bleeding. She has no neurologic complaints.  She denies any recent fevers or illnesses.  She has a good appetite and denies weight loss.  She denies any chest pain, shortness of breath, cough, or hemoptysis. She denies any nausea, vomiting, constipation, or diarrhea.  She has no urinary complaints. Patient offers no specific complaints today.  REVIEW OF SYSTEMS:   Review of Systems  Constitutional: Negative.  Negative for fever, malaise/fatigue and weight loss.  Respiratory: Negative.  Negative for cough, hemoptysis and shortness of breath.   Cardiovascular: Negative.  Negative for chest pain and leg swelling.  Gastrointestinal: Negative.  Negative for abdominal pain, blood in stool and melena.  Genitourinary: Negative.  Negative for hematuria.  Musculoskeletal: Negative.  Negative for back pain.  Skin: Negative.  Negative for rash.  Neurological: Negative.  Negative for dizziness, focal weakness, weakness and headaches.  Endo/Heme/Allergies: Negative.  Does not bruise/bleed easily.  Psychiatric/Behavioral: Negative.  The patient is not nervous/anxious.     As per HPI. Otherwise, a complete review of systems is negative.  PAST MEDICAL HISTORY: Past Medical History:  Diagnosis Date  . Anxiety   . Chicken pox   . Depression   . Elevated testosterone level in female   . Emphysema of lung (HCC)    bronchitis  . Frequent headaches   . Hay fever   . Hypertension   . Idiopathic  thrombocytopenic purpura (ITP) (HCC)   . Positive ANA (antinuclear antibody)     PAST SURGICAL HISTORY: Past Surgical History:  Procedure Laterality Date  . HEMATOMA EVACUATION N/A 04/07/2020   Procedure: EVACUATION HEMATOMA  AND APPLICATION OF PRESSURE DRESSING;  Surgeon: Benjaman Kindler, MD;  Location: [facility] ORS;  Service: Gynecology;  Laterality: N/A;  . HYSTEROSCOPY WITH NOVASURE N/A 02/21/2018   Procedure: HYSTEROSCOPY WITH NOVASURE, POLYPECTOMY;  Surgeon: Benjaman Kindler, MD;  Location: [facility] ORS;  Service: Gynecology;  Laterality: N/A;  . TUBAL LIGATION  1997  . TUBAL LIGATION      FAMILY HISTORY: Family History  Problem Relation Age of Onset  . Hyperlipidemia Mother   . Heart disease Mother   . Stroke Mother   . Depression Mother   . Mental illness Mother   . Heart attack Mother 1  . Hyperlipidemia Father   . Depression Father   . Mental illness Father   . Diabetes Paternal Grandfather   . Cancer Cousin     ADVANCED DIRECTIVES (Y/N):  N  HEALTH MAINTENANCE: Social History   Tobacco Use  . Smoking status: Current Every Day Smoker    Packs/day: 1.50    Years: 28.00    Pack years: 42.00    Types: Cigarettes  . Smokeless tobacco: Never Used  Vaping Use  . Vaping Use: Never used  Substance Use Topics  . Alcohol use: Yes    Alcohol/week: 8.0 standard drinks    Types: 8 Standard drinks or equivalent per week    Comment: Beer (Can)   . Drug use: No    Comment: CBD oil     Colonoscopy:  PAP:  Bone  density:  Lipid panel:  Allergies  Allergen Reactions  . Meloxicam Nausea Only    Current Outpatient Medications  Medication Sig Dispense Refill  . albuterol (PROVENTIL) (2.5 MG/3ML) 0.083% nebulizer solution Take 3 mLs (2.5 mg total) by nebulization every 6 (six) hours as needed for wheezing or shortness of breath. 360 mL 11  . albuterol (VENTOLIN HFA) 108 (90 Base) MCG/ACT inhaler Inhale into the lungs every 6 (six) hours as needed for wheezing or shortness  of breath.    . ALPRAZolam (XANAX) 0.25 MG tablet Take 1 tablet (0.25 mg total) by mouth daily as needed for anxiety. 30 tablet 2  . amLODipine (NORVASC) 10 MG tablet Take 1 tablet (10 mg total) by mouth daily. 90 tablet 3  . budesonide-formoterol (SYMBICORT) 160-4.5 MCG/ACT inhaler Inhale 2 puffs into the lungs 2 (two) times daily. Rinse mouth 1 Inhaler 11  . carvedilol (COREG) 25 MG tablet Take 1 tablet (25 mg total) by mouth 2 (two) times daily with a meal. 180 tablet 3  . cetirizine (ZYRTEC) 10 MG tablet Take 1 tablet (10 mg total) by mouth daily. 90 tablet 3  . Cholecalciferol 1.25 MG (50000 UT) capsule Take 1 capsule (50,000 Units total) by mouth every 30 (thirty) days. 1x per month note this 13 capsule 0  . cyanocobalamin (,VITAMIN B-12,) 1000 MCG/ML injection Inject 1 mL (1,000 mcg total) into the muscle every 30 (thirty) days. 8 mL 1  . cyclobenzaprine (FLEXERIL) 10 MG tablet Take 1 tablet (10 mg total) by mouth 3 (three) times daily as needed for muscle spasms. 30 tablet 0  . diclofenac Sodium (VOLTAREN) 1 % GEL Apply 2-4 g topically 4 (four) times daily. 2 grams upper body qid prn and 4 gram lower body qid prn 150 g 11  . dicyclomine (BENTYL) 10 MG capsule Take 1 capsule (10 mg total) by mouth 3 (three) times daily as needed for spasms. Before food 270 capsule 3  . escitalopram (LEXAPRO) 20 MG tablet Take 1 tablet (20 mg total) by mouth daily. 90 tablet 3  . fluticasone (FLONASE) 50 MCG/ACT nasal spray Place 2 sprays into both nostrils daily. 16 g 11  . Fluticasone-Umeclidin-Vilant (TRELEGY ELLIPTA) 200-62.5-25 MCG/INH AEPB Inhale 1 puff into the lungs daily. 28 each 11  . mirabegron ER (MYRBETRIQ) 25 MG TB24 tablet Take 1 tablet (25 mg total) by mouth daily. 90 tablet 3  . nicotine polacrilex (NICORETTE) 4 MG gum Take 1 each (4 mg total) by mouth as needed for smoking cessation. Chew 1 piece every 1-2 hours max 24 pieces per day x 1-6 weeks then week 7-9 every 2-4 hours and week 10-12  every 4-8 hours 300 tablet 5  . omeprazole (PRILOSEC) 20 MG capsule Take 1 capsule (20 mg total) by mouth daily. 30 capsule 0  . ondansetron (ZOFRAN) 4 MG tablet Take 1 tablet (4 mg total) by mouth every 8 (eight) hours as needed for nausea or vomiting. 40 tablet 2  . spironolactone (ALDACTONE) 25 MG tablet Take 1 tablet (25 mg total) by mouth daily. In am 90 tablet 3  . Tiotropium Bromide Monohydrate (SPIRIVA RESPIMAT) 1.25 MCG/ACT AERS Inhale 2 puffs into the lungs daily. 4 g 11  . traZODone (DESYREL) 50 MG tablet Take 0.5-1 tablets (25-50 mg total) by mouth at bedtime as needed for sleep. 90 tablet 3  . HYDROcodone-acetaminophen (NORCO/VICODIN) 5-325 MG tablet Take 1-2 tablets by mouth every 4 (four) hours as needed for moderate pain. (Patient not taking: Reported on 06/22/2020) 30  tablet 0  . nicotine (NICODERM CQ - DOSED IN MG/24 HOURS) 21 mg/24hr patch Place 1 patch (21 mg total) onto the skin daily. (Patient not taking: Reported on 06/22/2020) 28 patch 0  . predniSONE (DELTASONE) 20 MG tablet Take 2 tablets (40 mg total) by mouth daily with breakfast. (Patient not taking: Reported on 06/22/2020) 14 tablet 0  . Syringe/Needle, Disp, 25G X 1-1/2" 1 ML MISC 1 Device by Does not apply route as directed. 30 each 11   No current facility-administered medications for this visit.    OBJECTIVE: Vitals:   06/22/20 1422  BP: 129/60  Pulse: 80  Resp: 18  Temp: 99.3 F (37.4 C)  SpO2: 98%     Body mass index is 35.17 kg/m.    ECOG FS:0 - Asymptomatic  General: Well-developed, well-nourished, no acute distress. Eyes: Pink conjunctiva, anicteric sclera. HEENT: Normocephalic, moist mucous membranes. Lungs: No audible wheezing or coughing. Heart: Regular rate and rhythm. Abdomen: Soft, nontender, no obvious distention. Musculoskeletal: No edema, cyanosis, or clubbing. Neuro: Alert, answering all questions appropriately. Cranial nerves grossly intact. Skin: No rashes or petechiae noted. Psych:  Normal affect.   LAB RESULTS:  Lab Results  Component Value Date   NA 140 04/08/2020   K 4.6 04/08/2020   CL 104 04/08/2020   CO2 25 04/08/2020   GLUCOSE 131 (H) 04/08/2020   BUN 6 04/08/2020   CREATININE 0.77 04/08/2020   CALCIUM 8.5 (L) 04/08/2020   PROT 6.7 04/07/2020   ALBUMIN 3.8 04/07/2020   AST 52 (H) 04/07/2020   ALT 27 04/07/2020   ALKPHOS 58 04/07/2020   BILITOT 0.9 04/07/2020   GFRNONAA >60 04/08/2020   GFRAA >60 04/08/2020    Lab Results  Component Value Date   WBC 8.6 06/22/2020   NEUTROABS 5.6 06/22/2020   HGB 15.5 (H) 06/22/2020   HCT 45.0 06/22/2020   MCV 102.5 (H) 06/22/2020   PLT 45 (L) 06/22/2020     STUDIES: No results found.  Oncology history: ITP: Confirmed with normal bone marrow biopsy and normal FISH and cytogenetics on July 22, 2019.  Patient has a positive ANA which is likely clinically insignificant.  The remainder of her laboratory work was either negative or within normal limits. CT scan on March 04, 2017 did not reveal splenomegaly.  Previously, patient received 1 week of prednisone at 1 mg/kg dose and had no appreciable change in her platelet count.  Rituxan was denied by insurance.  Patient received 3 mcg/kg of Nplate 5 weeks ago increasing her platelet count   ASSESSMENT: ITP  PLAN:    1.  ITP:  -Typically receives Nplate every 2 to 3 weeks based on platelet count.   -Most recent platelet count from 06/22/2020 is 45,000.   -She last received Nplate on 44/96/7591.   -Proceed with Nplate today. -RTC in 2 weeks for lab work and Nplate for a platelet count below 100.   -She will see a provider in approximately 5 weeks   Disposition: -RTC as scheduled in 2-1/2 weeks and again in 5 weeks to see a provider with lab work.  I spent a total of 30 minutes reviewing chart data, face-to-face evaluation with the patient, counseling and coordination of care as detailed above.  Patient expressed understanding and was in agreement with  this plan. She also understands that She can call clinic at any time with any questions, concerns, or complaints.    Jacquelin Hawking, NP   06/22/2020 2:42 PM

## 2020-06-24 ENCOUNTER — Telehealth: Payer: BC Managed Care – PPO | Admitting: Physician Assistant

## 2020-06-24 DIAGNOSIS — J329 Chronic sinusitis, unspecified: Secondary | ICD-10-CM

## 2020-06-24 DIAGNOSIS — J441 Chronic obstructive pulmonary disease with (acute) exacerbation: Secondary | ICD-10-CM | POA: Diagnosis not present

## 2020-06-24 DIAGNOSIS — R059 Cough, unspecified: Secondary | ICD-10-CM | POA: Diagnosis not present

## 2020-06-24 MED ORDER — PREDNISONE 20 MG PO TABS: 40.0000 mg | ORAL_TABLET | Freq: Every day | ORAL | 0 refills | Status: AC

## 2020-06-24 MED ORDER — BENZONATATE 100 MG PO CAPS: 100.0000 mg | ORAL_CAPSULE | Freq: Three times a day (TID) | ORAL | 0 refills | Status: DC | PRN

## 2020-06-24 MED ORDER — AMOXICILLIN-POT CLAVULANATE 875-125 MG PO TABS: 1.0000 | ORAL_TABLET | Freq: Two times a day (BID) | ORAL | 0 refills | Status: DC

## 2020-06-24 NOTE — Progress Notes (Signed)
We are sorry that you are not feeling well.  Here is how we plan to help!  Based on what you have shared with me it looks like you have sinusitis.  Sinusitis is inflammation and infection in the sinus cavities of the head.  Based on your presentation I believe you most likely have Acute Bacterial Sinusitis.  This is an infection caused by bacteria and is treated with antibiotics. I have prescribed Augmentin 875mg /125mg  one tablet twice daily with food, for 7 days. Ans Prednisone.    You may use an oral decongestant such as Mucinex D or if you have glaucoma or high blood pressure use plain Mucinex. Saline nasal spray help and can safely be used as often as needed for congestion.  If you develop worsening sinus pain, fever or notice severe headache and vision changes, or if symptoms are not better after completion of antibiotic, please schedule an appointment with a health care provider.    Sinus infections are not as easily transmitted as other respiratory infection, however we still recommend that you avoid close contact with loved ones, especially the very young and elderly.  Remember to wash your hands thoroughly throughout the day as this is the number one way to prevent the spread of infection!  Home Care:  Only take medications as instructed by your medical team.  Complete the entire course of an antibiotic.  Do not take these medications with alcohol.  A steam or ultrasonic humidifier can help congestion.  You can place a towel over your head and breathe in the steam from hot water coming from a faucet.  Avoid close contacts especially the very young and the elderly.  Cover your mouth when you cough or sneeze.  Always remember to wash your hands.  Get Help Right Away If:  You develop worsening fever or sinus pain.  You develop a severe head ache or visual changes.  Your symptoms persist after you have completed your treatment plan.  Make sure you  Understand these  instructions.  Will watch your condition.  Will get help right away if you are not doing well or get worse.  Your e-visit answers were reviewed by a board certified advanced clinical practitioner to complete your personal care plan.  Depending on the condition, your plan could have included both over the counter or prescription medications.  If there is a problem please reply  once you have received a response from your provider.  Your safety is important to [place].  If you have drug allergies check your prescription carefully.    You can use MyChart to ask questions about today's visit, request a non-urgent call back, or ask for a work or school excuse for 24 hours related to this e-Visit. If it has been greater than 24 hours you will need to follow up with your provider, or enter a new e-Visit to address those concerns.  You will get an e-mail in the next two days asking about your experience.  I hope that your e-visit has been valuable and will speed your recovery. Thank you for using e-visits.   Greater than 5 minutes, yet less than 10 minutes of time have been spent researching, coordinating and implementing care for this patient today.

## 2020-06-24 NOTE — Addendum Note (Signed)
Addended by: Dorise Hiss on: 06/24/2020 11:44 AM   Modules accepted: Orders

## 2020-06-27 MED ORDER — ALBUTEROL SULFATE HFA 108 (90 BASE) MCG/ACT IN AERS: 2.0000 | INHALATION_SPRAY | Freq: Four times a day (QID) | RESPIRATORY_TRACT | 2 refills | Status: DC | PRN

## 2020-06-27 MED ORDER — PREDNISONE 10 MG (21) PO TBPK: ORAL_TABLET | ORAL | 0 refills | Status: DC

## 2020-06-27 NOTE — Telephone Encounter (Signed)
Patient is aware of below recommendations. She voiced her understanding and had no further questions.  Rx for prednisone and Ventolin has been sent to preferred pharmacy. Okay to refill ventolin per Dr. Patsey Berthold verbally.  Nothing further needed.

## 2020-06-27 NOTE — Telephone Encounter (Signed)
She may use Mucinex (plain). Will send extra Pred taper.

## 2020-06-27 NOTE — Telephone Encounter (Addendum)
Spoke to patient via telephone.   Patient reports of chest tightness, non productive cough, increased sob with exertion and wheezing x5d. Patient had e-visit via mychart and was prescribed prednisone and amoxicillin. She will complete prednisone today and she has 3 days left of amoxicillin. She has had some relief with treatment, however she is concerned that she is not able to produce any sputum. Using albuterol Q4H and trelegy once daily.  Denied F/C/S.  she is not able to get covid vaccines. She has not been covid tested lately. She is requesting refill on albuterol HFA. This was not prescribed by our office originally. Is it okay to prescribe?  Not taking any OTC meds.   Dr. Patsey Berthold, please advise. Thanks

## 2020-06-29 ENCOUNTER — Telehealth: Payer: BC Managed Care – PPO | Admitting: Nurse Practitioner

## 2020-06-29 DIAGNOSIS — B3731 Acute candidiasis of vulva and vagina: Secondary | ICD-10-CM

## 2020-06-29 DIAGNOSIS — B373 Candidiasis of vulva and vagina: Secondary | ICD-10-CM | POA: Diagnosis not present

## 2020-06-29 MED ORDER — FLUCONAZOLE 150 MG PO TABS: 150.0000 mg | ORAL_TABLET | Freq: Once | ORAL | 0 refills | Status: AC

## 2020-06-29 NOTE — Progress Notes (Signed)

## 2020-07-01 ENCOUNTER — Telehealth (INDEPENDENT_AMBULATORY_CARE_PROVIDER_SITE_OTHER): Payer: BC Managed Care – PPO | Admitting: Internal Medicine

## 2020-07-01 ENCOUNTER — Encounter: Payer: Self-pay | Admitting: Internal Medicine

## 2020-07-01 VITALS — BP 122/73 | HR 97 | Ht 62.5 in | Wt 195.0 lb

## 2020-07-01 DIAGNOSIS — G4700 Insomnia, unspecified: Secondary | ICD-10-CM | POA: Diagnosis not present

## 2020-07-01 DIAGNOSIS — R739 Hyperglycemia, unspecified: Secondary | ICD-10-CM

## 2020-07-01 DIAGNOSIS — E559 Vitamin D deficiency, unspecified: Secondary | ICD-10-CM

## 2020-07-01 DIAGNOSIS — D693 Immune thrombocytopenic purpura: Secondary | ICD-10-CM | POA: Diagnosis not present

## 2020-07-01 DIAGNOSIS — K219 Gastro-esophageal reflux disease without esophagitis: Secondary | ICD-10-CM

## 2020-07-01 DIAGNOSIS — Z1329 Encounter for screening for other suspected endocrine disorder: Secondary | ICD-10-CM

## 2020-07-01 DIAGNOSIS — J441 Chronic obstructive pulmonary disease with (acute) exacerbation: Secondary | ICD-10-CM | POA: Diagnosis not present

## 2020-07-01 DIAGNOSIS — E785 Hyperlipidemia, unspecified: Secondary | ICD-10-CM

## 2020-07-01 DIAGNOSIS — Z13818 Encounter for screening for other digestive system disorders: Secondary | ICD-10-CM

## 2020-07-01 DIAGNOSIS — Z1389 Encounter for screening for other disorder: Secondary | ICD-10-CM

## 2020-07-01 DIAGNOSIS — F17210 Nicotine dependence, cigarettes, uncomplicated: Secondary | ICD-10-CM

## 2020-07-01 DIAGNOSIS — E538 Deficiency of other specified B group vitamins: Secondary | ICD-10-CM

## 2020-07-01 MED ORDER — NICOTINE 7 MG/24HR TD PT24
7.0000 mg | MEDICATED_PATCH | Freq: Every day | TRANSDERMAL | 0 refills | Status: DC
Start: 2020-07-01 — End: 2020-10-29

## 2020-07-01 MED ORDER — NICOTINE 14 MG/24HR TD PT24
14.0000 mg | MEDICATED_PATCH | Freq: Every day | TRANSDERMAL | 0 refills | Status: DC
Start: 2020-07-01 — End: 2020-10-29

## 2020-07-01 MED ORDER — LEVOFLOXACIN 750 MG PO TABS: 750.0000 mg | ORAL_TABLET | Freq: Every day | ORAL | 0 refills | Status: DC

## 2020-07-01 MED ORDER — OMEPRAZOLE 20 MG PO CPDR: 20.0000 mg | DELAYED_RELEASE_CAPSULE | Freq: Every day | ORAL | 3 refills | Status: DC

## 2020-07-01 MED ORDER — TRAZODONE HCL 50 MG PO TABS
25.0000 mg | ORAL_TABLET | Freq: Every evening | ORAL | 3 refills | Status: DC | PRN
Start: 2020-07-01 — End: 2021-06-26

## 2020-07-01 MED ORDER — PREDNISONE 20 MG PO TABS: 40.0000 mg | ORAL_TABLET | Freq: Every day | ORAL | 0 refills | Status: DC

## 2020-07-01 NOTE — Progress Notes (Signed)
Was placed on antibiotics with steroids due to COPD flare up. This did not help and oxygen has been running 91-93.

## 2020-07-01 NOTE — Progress Notes (Signed)
Telephone Note  I connected with Carly [name]  on 07/01/20 at  3:20 PM EST by telephone and verified that I am speaking with the correct person using two identifiers.  Location patient: home, [place] Location provider:work or home office Persons participating in the virtual visit: patient, provider  I discussed the limitations of evaluation and management by telemedicine and the availability of in person appointments. The patient expressed understanding and agreed to proceed.   HPI: 1.Copd exacerbation again and on trelegy, albuterol neb and albuterol inhaler O2 91-93 % on prednisone with 2 days left and given amoxicillin 12/13. Taking mucinex dm bid but still having sinus pressure which she feels has gone into her chest with cough with mucous. Tessalon perles helping but still not better. pfts need to be set up  Smoking 8 cigs/qd and requests nicotine patches as gum irritates her mouth  She reports she has missed work x 2 month srecently   2. S/p plt transfusion 03/2020 when she had vulvar evacuation of hematoma and platelets responded and now trending back down will f/u with h/o   3. gerd wants refill of prilosec 20 mg qd and trazadone for sleep   ROS: See pertinent positives and negatives per HPI.  Past Medical History:  Diagnosis Date   Anxiety    Chicken pox    Depression    Elevated testosterone level in female    Emphysema of lung (HCC)    bronchitis   Frequent headaches    Hay fever    Hypertension    Idiopathic thrombocytopenic purpura (ITP) (HCC)    Positive ANA (antinuclear antibody)     Past Surgical History:  Procedure Laterality Date   HEMATOMA EVACUATION N/A 04/07/2020   Procedure: EVACUATION HEMATOMA  AND APPLICATION OF PRESSURE DRESSING;  Surgeon: Benjaman Kindler, MD;  Location: [facility] OR[facility];  Service: Gynecology;  Laterality: N/A;   HYSTEROSCOPY WITH NOVASURE N/A 02/21/2018   Procedure: HYSTEROSCOPY WITH NOVASURE, POLYPECTOMY;  Surgeon: Benjaman Kindler,  MD;  Location: [facility] OR[facility];  Service: Gynecology;  Laterality: N/A;   TUBAL LIGATION  1997   TUBAL LIGATION       Current Outpatient Medications:    albuterol (PROVENTIL) (2.5 MG/3ML) 0.083% nebulizer solution, Take 3 mLs (2.5 mg total) by nebulization every 6 (six) hours as needed for wheezing or shortness of breath., Disp: 360 mL, Rfl: 11   albuterol (VENTOLIN HFA) 108 (90 Base) MCG/ACT inhaler, Inhale 2 puffs into the lungs every 6 (six) hours as needed for wheezing or shortness of breath., Disp: 18 g, Rfl: 2   ALPRAZolam (XANAX) 0.25 MG tablet, Take 1 tablet (0.25 mg total) by mouth daily as needed for anxiety., Disp: 30 tablet, Rfl: 2   amLODipine (NORVASC) 10 MG tablet, Take 1 tablet (10 mg total) by mouth daily., Disp: 90 tablet, Rfl: 3   benzonatate (TESSALON) 100 MG capsule, Take 1-2 capsules (100-200 mg total) by mouth 3 (three) times daily as needed for cough., Disp: 40 capsule, Rfl: 0   carvedilol (COREG) 25 MG tablet, Take 1 tablet (25 mg total) by mouth 2 (two) times daily with a meal., Disp: 180 tablet, Rfl: 3   cetirizine (ZYRTEC) 10 MG tablet, Take 1 tablet (10 mg total) by mouth daily., Disp: 90 tablet, Rfl: 3   Cholecalciferol 1.25 MG (50000 UT) capsule, Take 1 capsule (50,000 Units total) by mouth every 30 (thirty) days. 1x per month note this, Disp: 13 capsule, Rfl: 0   cyanocobalamin (,VITAMIN B-12,) 1000 MCG/ML injection, Inject 1 mL (  1,000 mcg total) into the muscle every 30 (thirty) days., Disp: 8 mL, Rfl: 1   diclofenac Sodium (VOLTAREN) 1 % GEL, Apply 2-4 g topically 4 (four) times daily. 2 grams upper body qid prn and 4 gram lower body qid prn, Disp: 150 g, Rfl: 11   dicyclomine (BENTYL) 10 MG capsule, Take 1 capsule (10 mg total) by mouth 3 (three) times daily as needed for spasms. Before food, Disp: 270 capsule, Rfl: 3   escitalopram (LEXAPRO) 20 MG tablet, Take 1 tablet (20 mg total) by mouth daily., Disp: 90 tablet, Rfl: 3   fluticasone (FLONASE) 50  MCG/ACT nasal spray, Place 2 sprays into both nostrils daily., Disp: 16 g, Rfl: 11   Fluticasone-Umeclidin-Vilant (TRELEGY ELLIPTA) 200-62.5-25 MCG/INH AEPB, Inhale 1 puff into the lungs daily., Disp: 28 each, Rfl: 11   mirabegron ER (MYRBETRIQ) 25 MG TB24 tablet, Take 1 tablet (25 mg total) by mouth daily., Disp: 90 tablet, Rfl: 3   nicotine (NICODERM CQ - DOSED IN MG/24 HOURS) 21 mg/24hr patch, Place 1 patch (21 mg total) onto the skin daily., Disp: 28 patch, Rfl: 0   ondansetron (ZOFRAN) 4 MG tablet, Take 1 tablet (4 mg total) by mouth every 8 (eight) hours as needed for nausea or vomiting., Disp: 40 tablet, Rfl: 2   predniSONE (STERAPRED UNI-PAK 21 TAB) 10 MG (21) TBPK tablet, Use as directed., Disp: 21 each, Rfl: 0   spironolactone (ALDACTONE) 25 MG tablet, Take 1 tablet (25 mg total) by mouth daily. In am, Disp: 90 tablet, Rfl: 3   Syringe/Needle, Disp, 25G X 1-1/2" 1 ML MISC, 1 Device by Does not apply route as directed., Disp: 30 each, Rfl: 11   cyclobenzaprine (FLEXERIL) 10 MG tablet, Take 1 tablet (10 mg total) by mouth 3 (three) times daily as needed for muscle spasms. (Patient not taking: Reported on 07/01/2020), Disp: 30 tablet, Rfl: 0   levofloxacin (LEVAQUIN) 750 MG tablet, Take 1 tablet (750 mg total) by mouth daily. In am, Disp: 5 tablet, Rfl: 0   nicotine (NICODERM CQ - DOSED IN MG/24 HOURS) 14 mg/24hr patch, Place 1 patch (14 mg total) onto the skin daily., Disp: 60 patch, Rfl: 0   nicotine (NICODERM CQ - DOSED IN MG/24 HR) 7 mg/24hr patch, Place 1 patch (7 mg total) onto the skin daily., Disp: 30 patch, Rfl: 0   nicotine polacrilex (NICORETTE) 4 MG gum, Take 1 each (4 mg total) by mouth as needed for smoking cessation. Chew 1 piece every 1-2 hours max 24 pieces per day x 1-6 weeks then week 7-9 every 2-4 hours and week 10-12 every 4-8 hours (Patient not taking: Reported on 07/01/2020), Disp: 300 tablet, Rfl: 5   omeprazole (PRILOSEC) 20 MG capsule, Take 1 capsule (20 mg  total) by mouth daily., Disp: 90 capsule, Rfl: 3   predniSONE (DELTASONE) 20 MG tablet, Take 2 tablets (40 mg total) by mouth daily with breakfast. In am, Disp: 14 tablet, Rfl: 0   traZODone (DESYREL) 50 MG tablet, Take 0.5-1 tablets (25-50 mg total) by mouth at bedtime as needed for sleep., Disp: 90 tablet, Rfl: 3  EXAM:  VITALS per patient if applicable:  GENERAL: alert, oriented, appears well and in no acute distress  PSYCH/NEURO: pleasant and cooperative, no obvious depression or anxiety, speech and thought processing grossly intact  ASSESSMENT AND PLAN:  Discussed the following assessment and plan:  COPD exacerbation ([place]) - Plan: predniSONE (DELTASONE) 40 MG tablet x 7 days, levofloxacin (LEVAQUIN) 750 MG tablet x  5 days Cigarette nicotine dependence without complication - Plan: nicotine (NICODERM CQ - DOSED IN MG/24 HOURS) 14 mg/24hr patch, nicotine (NICODERM CQ - DOSED IN MG/24 HR) 7 mg/24hr patch  Chronic ITP (idiopathic thrombocytopenia) (HCC) - Plan: Urinalysis, Routine w reflex microscopic Fu with h/o for  Treatment  S/p plt infusion 03/2020   Gastroesophageal reflux disease without esophagitis - Plan: omeprazole (PRILOSEC) 20 MG capsule qd   Insomnia, unspecified type - Plan: traZODone (DESYREL) 50 MG tablet  Hyperlipidemia, unspecified hyperlipidemia type - Plan: Lipid panel   HM Flu shotutd, not had 2021  tdap utd due in 2026 covid 19 vaccine consider in futurewill need to be approved Dr. Grayland Ormond due to chronic ITP  Pap overdue sch Dr. Cresenciano Lick to Dr. Langston Reusing ablation and cervical polyps -she will call [facility] ob/gyn Dr. Leonides Schanz in [place] and get this scheduled due for pap  mammo1/4/21neg ordered 2022 [place]   [facility] female[facility] plts 1st consider in futurevs cologaurd too risk to do colonoscopy with chronic ITP currently   Skinreferred [facility]? derm appt sch'ed or had  -we discussed  possible serious and likely etiologies, options for evaluation and workup, limitations of telemedicine visit vs in person visit, treatment, treatment risks and precautions.     I discussed the assessment and treatment plan with the patient. The patient was provided an opportunity to ask questions and all were answered. The patient agreed with the plan and demonstrated an understanding of the instructions.    Time 30 min Delorise Jackson, MD

## 2020-07-04 ENCOUNTER — Telehealth: Payer: Self-pay

## 2020-07-04 NOTE — Telephone Encounter (Signed)
Left vm to schedule fasting labs asap and 9m f/u with PCP

## 2020-07-13 ENCOUNTER — Inpatient Hospital Stay: Payer: BC Managed Care – PPO

## 2020-07-18 ENCOUNTER — Inpatient Hospital Stay: Payer: BC Managed Care – PPO

## 2020-07-18 ENCOUNTER — Inpatient Hospital Stay: Payer: BC Managed Care – PPO | Attending: Oncology

## 2020-07-18 ENCOUNTER — Other Ambulatory Visit: Payer: Self-pay

## 2020-07-18 VITALS — Wt 192.3 lb

## 2020-07-18 DIAGNOSIS — F17210 Nicotine dependence, cigarettes, uncomplicated: Secondary | ICD-10-CM | POA: Diagnosis not present

## 2020-07-18 DIAGNOSIS — D693 Immune thrombocytopenic purpura: Secondary | ICD-10-CM

## 2020-07-18 DIAGNOSIS — Z809 Family history of malignant neoplasm, unspecified: Secondary | ICD-10-CM | POA: Insufficient documentation

## 2020-07-18 DIAGNOSIS — Z79899 Other long term (current) drug therapy: Secondary | ICD-10-CM | POA: Insufficient documentation

## 2020-07-18 LAB — CBC WITH DIFFERENTIAL/PLATELET
Abs Immature Granulocytes: 0.01 10*3/uL (ref 0.00–0.07)
Basophils Absolute: 0 10*3/uL (ref 0.0–0.1)
Basophils Relative: 1 %
Eosinophils Absolute: 0.3 10*3/uL (ref 0.0–0.5)
Eosinophils Relative: 5 %
HCT: 49.4 % — ABNORMAL HIGH (ref 36.0–46.0)
Hemoglobin: 17 g/dL — ABNORMAL HIGH (ref 12.0–15.0)
Immature Granulocytes: 0 %
Lymphocytes Relative: 27 %
Lymphs Abs: 1.7 10*3/uL (ref 0.7–4.0)
MCH: 34.7 pg — ABNORMAL HIGH (ref 26.0–34.0)
MCHC: 34.4 g/dL (ref 30.0–36.0)
MCV: 100.8 fL — ABNORMAL HIGH (ref 80.0–100.0)
Monocytes Absolute: 0.5 10*3/uL (ref 0.1–1.0)
Monocytes Relative: 8 %
Neutro Abs: 3.6 10*3/uL (ref 1.7–7.7)
Neutrophils Relative %: 59 %
Platelets: 33 10*3/uL — ABNORMAL LOW (ref 150–400)
RBC: 4.9 MIL/uL (ref 3.87–5.11)
RDW: 13 % (ref 11.5–15.5)
WBC: 6.1 10*3/uL (ref 4.0–10.5)
nRBC: 0 % (ref 0.0–0.2)

## 2020-07-18 MED ORDER — ROMIPLOSTIM INJECTION 500 MCG
3.0000 ug/kg | Freq: Once | SUBCUTANEOUS | Status: AC
  Administered 2020-07-18: 260 ug via SUBCUTANEOUS
  Filled 2020-07-18: qty 0.52

## 2020-07-23 ENCOUNTER — Encounter: Payer: Self-pay | Admitting: Physician Assistant

## 2020-07-23 ENCOUNTER — Telehealth: Payer: BC Managed Care – PPO | Admitting: Physician Assistant

## 2020-07-23 DIAGNOSIS — B373 Candidiasis of vulva and vagina: Secondary | ICD-10-CM

## 2020-07-23 DIAGNOSIS — B3731 Acute candidiasis of vulva and vagina: Secondary | ICD-10-CM

## 2020-07-23 MED ORDER — FLUCONAZOLE 150 MG PO TABS: 150.0000 mg | ORAL_TABLET | Freq: Once | ORAL | 0 refills | Status: AC

## 2020-07-23 NOTE — Progress Notes (Signed)
[place]  [phone]) [phone] [phone]) [phone]  ID: Lin Landsman OB: [birth year]  MR#: [id number]  TDD#:[id number]  Patient Care Team: McLean-Scocuzza, Nino Glow, MD as PCP - General (Internal Medicine) Lloyd Huger, MD as Consulting Physician (Oncology)   CHIEF COMPLAINT: ITP.  INTERVAL HISTORY: Patient returns to clinic today for repeat laboratory work, further evaluation, and continuation of Nplate.  She continues to feel well and remains asymptomatic.  She denies any easy bleeding or bruising. She has no neurologic complaints.  She denies any recent fevers or illnesses.  She has a good appetite and denies weight loss.  She denies any chest pain, shortness of breath, cough, or hemoptysis. She denies any nausea, vomiting, constipation, or diarrhea.  She has no urinary complaints.  Patient feels at her baseline offers no specific complaints today.  REVIEW OF SYSTEMS:   Review of Systems  Constitutional: Negative.  Negative for fever, malaise/fatigue and weight loss.  Respiratory: Negative.  Negative for cough, hemoptysis and shortness of breath.   Cardiovascular: Negative.  Negative for chest pain and leg swelling.  Gastrointestinal: Negative.  Negative for abdominal pain, blood in stool and melena.  Genitourinary: Negative.  Negative for hematuria.  Musculoskeletal: Negative.  Negative for back pain.  Skin: Negative.  Negative for rash.  Neurological: Negative.  Negative for dizziness, focal weakness, weakness and headaches.  Endo/Heme/Allergies: Negative.  Does not bruise/bleed easily.  Psychiatric/Behavioral: Negative.  The patient is not nervous/anxious.     As per HPI. Otherwise, a complete review of systems is negative.  PAST MEDICAL HISTORY: Past Medical History:  Diagnosis Date  . Anxiety   . Chicken pox   . Depression   . Elevated testosterone level in female   . Emphysema of lung (HCC)    bronchitis  . Frequent headaches   . Hay  fever   . Hypertension   . Idiopathic thrombocytopenic purpura (ITP) (HCC)   . Positive ANA (antinuclear antibody)     PAST SURGICAL HISTORY: Past Surgical History:  Procedure Laterality Date  . HEMATOMA EVACUATION N/A 04/07/2020   Procedure: EVACUATION HEMATOMA  AND APPLICATION OF PRESSURE DRESSING;  Surgeon: Benjaman Kindler, MD;  Location: [facility] ORS;  Service: Gynecology;  Laterality: N/A;  . HYSTEROSCOPY WITH NOVASURE N/A 02/21/2018   Procedure: HYSTEROSCOPY WITH NOVASURE, POLYPECTOMY;  Surgeon: Benjaman Kindler, MD;  Location: [facility] ORS;  Service: Gynecology;  Laterality: N/A;  . TUBAL LIGATION  1997  . TUBAL LIGATION      FAMILY HISTORY: Family History  Problem Relation Age of Onset  . Hyperlipidemia Mother   . Heart disease Mother   . Stroke Mother   . Depression Mother   . Mental illness Mother   . Heart attack Mother 66  . Hyperlipidemia Father   . Depression Father   . Mental illness Father   . Diabetes Paternal Grandfather   . Cancer Cousin     ADVANCED DIRECTIVES (Y/N):  N  HEALTH MAINTENANCE: Social History   Tobacco Use  . Smoking status: Current Every Day Smoker    Packs/day: 1.50    Years: 28.00    Pack years: 42.00    Types: Cigarettes  . Smokeless tobacco: Never Used  Vaping Use  . Vaping Use: Never used  Substance Use Topics  . Alcohol use: Yes    Alcohol/week: 8.0 standard drinks    Types: 8 Standard drinks or equivalent per week    Comment: Beer (Can)   . Drug use: No    Comment:  CBD oil     Colonoscopy:  PAP:  Bone density:  Lipid panel:  Allergies  Allergen Reactions  . Meloxicam Nausea Only    Current Outpatient Medications  Medication Sig Dispense Refill  . albuterol (PROVENTIL) (2.5 MG/3ML) 0.083% nebulizer solution Take 3 mLs (2.5 mg total) by nebulization every 6 (six) hours as needed for wheezing or shortness of breath. 360 mL 11  . albuterol (VENTOLIN HFA) 108 (90 Base) MCG/ACT inhaler Inhale 2 puffs into the lungs every 6  (six) hours as needed for wheezing or shortness of breath. 18 g 2  . ALPRAZolam (XANAX) 0.25 MG tablet Take 1 tablet (0.25 mg total) by mouth daily as needed for anxiety. 30 tablet 2  . amLODipine (NORVASC) 10 MG tablet Take 1 tablet (10 mg total) by mouth daily. 90 tablet 3  . benzonatate (TESSALON) 100 MG capsule Take 1-2 capsules (100-200 mg total) by mouth 3 (three) times daily as needed for cough. 40 capsule 0  . carvedilol (COREG) 25 MG tablet Take 1 tablet (25 mg total) by mouth 2 (two) times daily with a meal. 180 tablet 3  . cetirizine (ZYRTEC) 10 MG tablet Take 1 tablet (10 mg total) by mouth daily. 90 tablet 3  . Cholecalciferol 1.25 MG (50000 UT) capsule Take 1 capsule (50,000 Units total) by mouth every 30 (thirty) days. 1x per month note this 13 capsule 0  . cyanocobalamin (,VITAMIN B-12,) 1000 MCG/ML injection Inject 1 mL (1,000 mcg total) into the muscle every 30 (thirty) days. 8 mL 1  . diclofenac Sodium (VOLTAREN) 1 % GEL Apply 2-4 g topically 4 (four) times daily. 2 grams upper body qid prn and 4 gram lower body qid prn 150 g 11  . dicyclomine (BENTYL) 10 MG capsule Take 1 capsule (10 mg total) by mouth 3 (three) times daily as needed for spasms. Before food 270 capsule 3  . escitalopram (LEXAPRO) 20 MG tablet Take 1 tablet (20 mg total) by mouth daily. 90 tablet 3  . fluticasone (FLONASE) 50 MCG/ACT nasal spray Place 2 sprays into both nostrils daily. 16 g 11  . Fluticasone-Umeclidin-Vilant (TRELEGY ELLIPTA) 200-62.5-25 MCG/INH AEPB Inhale 1 puff into the lungs daily. 28 each 11  . levofloxacin (LEVAQUIN) 750 MG tablet Take 1 tablet (750 mg total) by mouth daily. In am 5 tablet 0  . mirabegron ER (MYRBETRIQ) 25 MG TB24 tablet Take 1 tablet (25 mg total) by mouth daily. 90 tablet 3  . nicotine (NICODERM CQ - DOSED IN MG/24 HOURS) 14 mg/24hr patch Place 1 patch (14 mg total) onto the skin daily. 60 patch 0  . nicotine (NICODERM CQ - DOSED IN MG/24 HOURS) 21 mg/24hr patch Place 1  patch (21 mg total) onto the skin daily. 28 patch 0  . nicotine (NICODERM CQ - DOSED IN MG/24 HR) 7 mg/24hr patch Place 1 patch (7 mg total) onto the skin daily. 30 patch 0  . omeprazole (PRILOSEC) 20 MG capsule Take 1 capsule (20 mg total) by mouth daily. 90 capsule 3  . ondansetron (ZOFRAN) 4 MG tablet Take 1 tablet (4 mg total) by mouth every 8 (eight) hours as needed for nausea or vomiting. 40 tablet 2  . predniSONE (DELTASONE) 20 MG tablet Take 2 tablets (40 mg total) by mouth daily with breakfast. In am 14 tablet 0  . predniSONE (STERAPRED UNI-PAK 21 TAB) 10 MG (21) TBPK tablet Use as directed. 21 each 0  . spironolactone (ALDACTONE) 25 MG tablet Take 1 tablet (25 mg  total) by mouth daily. In am 90 tablet 3  . Syringe/Needle, Disp, 25G X 1-1/2" 1 ML MISC 1 Device by Does not apply route as directed. 30 each 11  . traZODone (DESYREL) 50 MG tablet Take 0.5-1 tablets (25-50 mg total) by mouth at bedtime as needed for sleep. 90 tablet 3  . cyclobenzaprine (FLEXERIL) 10 MG tablet Take 1 tablet (10 mg total) by mouth 3 (three) times daily as needed for muscle spasms. (Patient not taking: No sig reported) 30 tablet 0  . nicotine polacrilex (NICORETTE) 4 MG gum Take 1 each (4 mg total) by mouth as needed for smoking cessation. Chew 1 piece every 1-2 hours max 24 pieces per day x 1-6 weeks then week 7-9 every 2-4 hours and week 10-12 every 4-8 hours (Patient not taking: No sig reported) 300 tablet 5   No current facility-administered medications for this visit.    OBJECTIVE: Vitals:   07/27/20 1017  BP: 132/86  Pulse: 83  Resp: 20  Temp: 98.1 F (36.7 C)     Body mass index is 34.56 kg/m.    ECOG FS:0 - Asymptomatic  General: Well-developed, well-nourished, no acute distress. Eyes: Pink conjunctiva, anicteric sclera. HEENT: Normocephalic, moist mucous membranes. Lungs: No audible wheezing or coughing. Heart: Regular rate and rhythm. Abdomen: Soft, nontender, no obvious  distention. Musculoskeletal: No edema, cyanosis, or clubbing. Neuro: Alert, answering all questions appropriately. Cranial nerves grossly intact. Skin: No rashes or petechiae noted. Psych: Normal affect.    LAB RESULTS:  Lab Results  Component Value Date   NA 140 04/08/2020   K 4.6 04/08/2020   CL 104 04/08/2020   CO2 25 04/08/2020   GLUCOSE 131 (H) 04/08/2020   BUN 6 04/08/2020   CREATININE 0.77 04/08/2020   CALCIUM 8.5 (L) 04/08/2020   PROT 6.7 04/07/2020   ALBUMIN 3.8 04/07/2020   AST 52 (H) 04/07/2020   ALT 27 04/07/2020   ALKPHOS 58 04/07/2020   BILITOT 0.9 04/07/2020   GFRNONAA >60 04/08/2020   GFRAA >60 04/08/2020    Lab Results  Component Value Date   WBC 6.9 07/27/2020   NEUTROABS 3.8 07/27/2020   HGB 16.2 (H) 07/27/2020   HCT 47.7 (H) 07/27/2020   MCV 100.8 (H) 07/27/2020   PLT 287 07/27/2020     STUDIES: No results found.  ASSESSMENT: ITP  PLAN:    1.  ITP: Confirmed with normal bone marrow biopsy and normal FISH and cytogenetics on July 22, 2019.  Patient has a positive ANA which is likely clinically insignificant.  The remainder of her laboratory work was either negative or within normal limits. CT scan on March 04, 2017 did not reveal splenomegaly.  Previously, patient received 1 week of prednisone at 1 mg/kg dose and had no appreciable change in her platelet count.  Rituxan was denied by insurance.  Patient last received 3 mcg/kg of Nplate 9 days ago with her platelet count increasing from 33-287.  It appears she only requires treatment approximately every 4 weeks.  No treatment is needed today.  Return to clinic in 2 weeks for laboratory work and consideration of Nplate if her platelets are below 100 and then in 4 weeks for repeat laboratory work, further evaluation, and continuation of treatment if needed.   2.  Positive ANA: Likely clinically insignificant.  Referral was previously sent to rheumatology. 3.  Labial laceration: Resolved.  I spent  a total of 20 minutes reviewing chart data, face-to-face evaluation with the patient, counseling and coordination   of care as detailed above.   Patient expressed understanding and was in agreement with this plan. She also understands that She can call clinic at any time with any questions, concerns, or complaints.     J , MD   07/27/2020 2:03 PM     

## 2020-07-23 NOTE — Progress Notes (Signed)
We are sorry that you are not feeling well. Here is how we plan to help! Based on what you shared with me it looks like you: May have a yeast vaginosis  Vaginosis is an inflammation of the vagina that can result in discharge, itching and pain. The cause is usually a change in the normal balance of vaginal bacteria or an infection. Vaginosis can also result from reduced estrogen levels after menopause.  The most common causes of vaginosis are:   Bacterial vaginosis which results from an overgrowth of one on several organisms that are normally present in your vagina.   Yeast infections which are caused by a naturally occurring fungus called candida.   Vaginal atrophy (atrophic vaginosis) which results from the thinning of the vagina from reduced estrogen levels after menopause.   Trichomoniasis which is caused by a parasite and is commonly transmitted by sexual intercourse.  Factors that increase your risk of developing vaginosis include: Marland Kitchen Medications, such as antibiotics and steroids . Uncontrolled diabetes . Use of hygiene products such as bubble bath, vaginal spray or vaginal deodorant . Douching . Wearing damp or tight-fitting clothing . Using an intrauterine device (IUD) for birth control . Hormonal changes, such as those associated with pregnancy, birth control pills or menopause . Sexual activity . Having a sexually transmitted infection  Your treatment plan is A single Diflucan (fluconazole) 150mg  tablet once.  I have electronically sent this prescription into the pharmacy that you have chosen.  Ms. Mcnerney, it appears that you may have had similar symptoms approx 3 weeks ago. If current medication does not cure your symptoms, please consider a face to face visit with your doctor for further evaluation of your symptoms.   Be sure to take all of the medication as directed. Stop taking any medication if you develop a rash, tongue swelling or shortness of breath. Mothers who are  breast feeding should consider pumping and discarding their breast milk while on these antibiotics. However, there is no consensus that infant exposure at these doses would be harmful.  Remember that medication creams can weaken latex condoms. Marland Kitchen   HOME CARE:  Good hygiene may prevent some types of vaginosis from recurring and may relieve some symptoms:  . Avoid baths, hot tubs and whirlpool spas. Rinse soap from your outer genital area after a shower, and dry the area well to prevent irritation. Don't use scented or harsh soaps, such as those with deodorant or antibacterial action. Marland Kitchen Avoid irritants. These include scented tampons and pads. . Wipe from front to back after using the toilet. Doing so avoids spreading fecal bacteria to your vagina.  Other things that may help prevent vaginosis include:  Marland Kitchen Don't douche. Your vagina doesn't require cleansing other than normal bathing. Repetitive douching disrupts the normal organisms that reside in the vagina and can actually increase your risk of vaginal infection. Douching won't clear up a vaginal infection. . Use a latex condom. Both female and female latex condoms may help you avoid infections spread by sexual contact. . Wear cotton underwear. Also wear pantyhose with a cotton crotch. If you feel comfortable without it, skip wearing underwear to bed. Yeast thrives in [employer] Your symptoms should improve in the next day or two.  GET HELP RIGHT AWAY IF:  . You have pain in your lower abdomen ( pelvic area or over your ovaries) . You develop nausea or vomiting . You develop a fever . Your discharge changes or worsens . You have persistent  pain with intercourse . You develop shortness of breath, a rapid pulse, or you faint.  These symptoms could be signs of problems or infections that need to be evaluated by a medical provider now.  MAKE SURE YOU    Understand these instructions.  Will watch your condition.  Will get help  right away if you are not doing well or get worse.  Your e-visit answers were reviewed by a board certified advanced clinical practitioner to complete your personal care plan. Depending upon the condition, your plan could have included both over the counter or prescription medications. Please review your pharmacy choice to make sure that you have choses a pharmacy that is open for you to pick up any needed prescription, Your safety is important to [place]. If you have drug allergies check your prescription carefully.   You can use MyChart to ask questions about today's visit, request a non-urgent call back, or ask for a work or school excuse for 24 hours related to this e-Visit. If it has been greater than 24 hours you will need to follow up with your provider, or enter a new e-Visit to address those concerns. You will get a MyChart message within the next two days asking about your experience. I hope that your e-visit has been valuable and will speed your recovery. I spent 5-10 minutes on review and completion of this note- Lacy Duverney [facility]

## 2020-07-27 ENCOUNTER — Inpatient Hospital Stay: Payer: BC Managed Care – PPO

## 2020-07-27 ENCOUNTER — Other Ambulatory Visit: Payer: Self-pay

## 2020-07-27 ENCOUNTER — Encounter: Payer: Self-pay | Admitting: Oncology

## 2020-07-27 ENCOUNTER — Inpatient Hospital Stay (HOSPITAL_BASED_OUTPATIENT_CLINIC_OR_DEPARTMENT_OTHER): Payer: BC Managed Care – PPO | Admitting: Oncology

## 2020-07-27 VITALS — BP 132/86 | HR 83 | Temp 98.1°F | Resp 20 | Wt 192.0 lb

## 2020-07-27 DIAGNOSIS — Z809 Family history of malignant neoplasm, unspecified: Secondary | ICD-10-CM | POA: Diagnosis not present

## 2020-07-27 DIAGNOSIS — D693 Immune thrombocytopenic purpura: Secondary | ICD-10-CM

## 2020-07-27 DIAGNOSIS — F17210 Nicotine dependence, cigarettes, uncomplicated: Secondary | ICD-10-CM | POA: Diagnosis not present

## 2020-07-27 DIAGNOSIS — Z79899 Other long term (current) drug therapy: Secondary | ICD-10-CM | POA: Diagnosis not present

## 2020-07-27 LAB — CBC WITH DIFFERENTIAL/PLATELET
Abs Immature Granulocytes: 0.02 10*3/uL (ref 0.00–0.07)
Basophils Absolute: 0.1 10*3/uL (ref 0.0–0.1)
Basophils Relative: 1 %
Eosinophils Absolute: 0.3 10*3/uL (ref 0.0–0.5)
Eosinophils Relative: 4 %
HCT: 47.7 % — ABNORMAL HIGH (ref 36.0–46.0)
Hemoglobin: 16.2 g/dL — ABNORMAL HIGH (ref 12.0–15.0)
Immature Granulocytes: 0 %
Lymphocytes Relative: 27 %
Lymphs Abs: 1.9 10*3/uL (ref 0.7–4.0)
MCH: 34.2 pg — ABNORMAL HIGH (ref 26.0–34.0)
MCHC: 34 g/dL (ref 30.0–36.0)
MCV: 100.8 fL — ABNORMAL HIGH (ref 80.0–100.0)
Monocytes Absolute: 0.8 10*3/uL (ref 0.1–1.0)
Monocytes Relative: 12 %
Neutro Abs: 3.8 10*3/uL (ref 1.7–7.7)
Neutrophils Relative %: 56 %
Platelets: 287 10*3/uL (ref 150–400)
RBC: 4.73 MIL/uL (ref 3.87–5.11)
RDW: 12.9 % (ref 11.5–15.5)
WBC: 6.9 10*3/uL (ref 4.0–10.5)
nRBC: 0 % (ref 0.0–0.2)

## 2020-07-27 NOTE — Progress Notes (Signed)
Patient denies any concerns today.  

## 2020-08-10 ENCOUNTER — Ambulatory Visit: Payer: BC Managed Care – PPO | Admitting: Oncology

## 2020-08-10 ENCOUNTER — Inpatient Hospital Stay: Payer: BC Managed Care – PPO

## 2020-08-10 DIAGNOSIS — D693 Immune thrombocytopenic purpura: Secondary | ICD-10-CM

## 2020-08-10 DIAGNOSIS — F17210 Nicotine dependence, cigarettes, uncomplicated: Secondary | ICD-10-CM | POA: Diagnosis not present

## 2020-08-10 DIAGNOSIS — Z79899 Other long term (current) drug therapy: Secondary | ICD-10-CM | POA: Diagnosis not present

## 2020-08-10 DIAGNOSIS — Z809 Family history of malignant neoplasm, unspecified: Secondary | ICD-10-CM | POA: Diagnosis not present

## 2020-08-10 LAB — CBC WITH DIFFERENTIAL/PLATELET
Abs Immature Granulocytes: 0.02 10*3/uL (ref 0.00–0.07)
Basophils Absolute: 0.1 10*3/uL (ref 0.0–0.1)
Basophils Relative: 1 %
Eosinophils Absolute: 0.2 10*3/uL (ref 0.0–0.5)
Eosinophils Relative: 2 %
HCT: 48.1 % — ABNORMAL HIGH (ref 36.0–46.0)
Hemoglobin: 16.5 g/dL — ABNORMAL HIGH (ref 12.0–15.0)
Immature Granulocytes: 0 %
Lymphocytes Relative: 24 %
Lymphs Abs: 1.8 10*3/uL (ref 0.7–4.0)
MCH: 33.9 pg (ref 26.0–34.0)
MCHC: 34.3 g/dL (ref 30.0–36.0)
MCV: 98.8 fL (ref 80.0–100.0)
Monocytes Absolute: 0.7 10*3/uL (ref 0.1–1.0)
Monocytes Relative: 9 %
Neutro Abs: 4.9 10*3/uL (ref 1.7–7.7)
Neutrophils Relative %: 64 %
Platelets: 45 10*3/uL — ABNORMAL LOW (ref 150–400)
RBC: 4.87 MIL/uL (ref 3.87–5.11)
RDW: 12.6 % (ref 11.5–15.5)
Smear Review: DECREASED
WBC: 7.6 10*3/uL (ref 4.0–10.5)
nRBC: 0 % (ref 0.0–0.2)

## 2020-08-10 MED ORDER — ROMIPLOSTIM INJECTION 500 MCG
3.0000 ug/kg | Freq: Once | SUBCUTANEOUS | Status: AC
  Administered 2020-08-10: 260 ug via SUBCUTANEOUS
  Filled 2020-08-10: qty 0.52

## 2020-08-22 ENCOUNTER — Telehealth: Payer: Self-pay | Admitting: Oncology

## 2020-08-22 NOTE — Telephone Encounter (Signed)
Pt left vm requesting a call to reschedule an appt.  Routing to scheduler for follow-up.

## 2020-08-24 ENCOUNTER — Inpatient Hospital Stay: Payer: BC Managed Care – PPO | Admitting: Oncology

## 2020-08-24 ENCOUNTER — Inpatient Hospital Stay: Payer: BC Managed Care – PPO

## 2020-08-24 DIAGNOSIS — Z7189 Other specified counseling: Secondary | ICD-10-CM | POA: Diagnosis not present

## 2020-08-24 DIAGNOSIS — R6889 Other general symptoms and signs: Secondary | ICD-10-CM | POA: Diagnosis not present

## 2020-08-24 DIAGNOSIS — Z03818 Encounter for observation for suspected exposure to other biological agents ruled out: Secondary | ICD-10-CM | POA: Diagnosis not present

## 2020-08-24 DIAGNOSIS — U071 COVID-19: Secondary | ICD-10-CM | POA: Diagnosis not present

## 2020-09-02 ENCOUNTER — Inpatient Hospital Stay: Payer: BC Managed Care – PPO

## 2020-09-02 ENCOUNTER — Inpatient Hospital Stay: Payer: BC Managed Care – PPO | Attending: Oncology | Admitting: Oncology

## 2020-09-02 VITALS — BP 129/75 | HR 72 | Temp 98.8°F | Resp 16 | Wt 194.5 lb

## 2020-09-02 DIAGNOSIS — D693 Immune thrombocytopenic purpura: Secondary | ICD-10-CM

## 2020-09-02 DIAGNOSIS — Z79899 Other long term (current) drug therapy: Secondary | ICD-10-CM | POA: Diagnosis not present

## 2020-09-02 LAB — CBC WITH DIFFERENTIAL/PLATELET
Abs Immature Granulocytes: 0.02 10*3/uL (ref 0.00–0.07)
Basophils Absolute: 0 10*3/uL (ref 0.0–0.1)
Basophils Relative: 0 %
Eosinophils Absolute: 0.2 10*3/uL (ref 0.0–0.5)
Eosinophils Relative: 2 %
HCT: 47 % — ABNORMAL HIGH (ref 36.0–46.0)
Hemoglobin: 15.6 g/dL — ABNORMAL HIGH (ref 12.0–15.0)
Immature Granulocytes: 0 %
Lymphocytes Relative: 25 %
Lymphs Abs: 2 10*3/uL (ref 0.7–4.0)
MCH: 32.8 pg (ref 26.0–34.0)
MCHC: 33.2 g/dL (ref 30.0–36.0)
MCV: 98.7 fL (ref 80.0–100.0)
Monocytes Absolute: 0.8 10*3/uL (ref 0.1–1.0)
Monocytes Relative: 10 %
Neutro Abs: 5 10*3/uL (ref 1.7–7.7)
Neutrophils Relative %: 63 %
Platelets: 35 10*3/uL — ABNORMAL LOW (ref 150–400)
RBC: 4.76 MIL/uL (ref 3.87–5.11)
RDW: 13.4 % (ref 11.5–15.5)
WBC: 8 10*3/uL (ref 4.0–10.5)
nRBC: 0 % (ref 0.0–0.2)

## 2020-09-02 MED ORDER — ROMIPLOSTIM INJECTION 500 MCG
3.0000 ug/kg | Freq: Once | SUBCUTANEOUS | Status: AC
  Administered 2020-09-02: 265 ug via SUBCUTANEOUS
  Filled 2020-09-02: qty 0.53

## 2020-09-02 NOTE — Progress Notes (Signed)
[facility]  Telephone:(336) [phone] Fax:(336) [phone]  ID: Carly [name] OB: 1970-10-26  MR#: [id number]  NID#:[id number]  Patient Care Team: McLean-Scocuzza, Nino Glow, MD as PCP - General (Internal Medicine) Lloyd Huger, MD as Consulting Physician (Oncology)   CHIEF COMPLAINT: ITP.  INTERVAL HISTORY: Patient returns to clinic today for repeat laboratory work, further evaluation, and continuation of Nplate. She last received Nplate on 1/44/31.    She continues to feel well and remains asymptomatic.  She denies any easy bleeding or bruising. She has no neurologic complaints.  She denies any recent fevers or illnesses.  She has a good appetite and denies weight loss.  She denies any chest pain, shortness of breath, cough, or hemoptysis. She denies any nausea, vomiting, constipation, or diarrhea.  She has no urinary complaints.  Patient feels at her baseline offers no specific complaints today.  REVIEW OF SYSTEMS:   Review of Systems  Constitutional: Negative.  Negative for fever, malaise/fatigue and weight loss.  Respiratory: Negative.  Negative for cough, hemoptysis and shortness of breath.   Cardiovascular: Negative.  Negative for chest pain and leg swelling.  Gastrointestinal: Negative.  Negative for abdominal pain, blood in stool and melena.  Genitourinary: Negative.  Negative for hematuria.  Musculoskeletal: Negative.  Negative for back pain.  Skin: Negative.  Negative for rash.  Neurological: Negative.  Negative for dizziness, focal weakness, weakness and headaches.  Endo/Heme/Allergies: Negative.  Does not bruise/bleed easily.  Psychiatric/Behavioral: Negative.  The patient is not nervous/anxious.     As per HPI. Otherwise, a complete review of systems is negative.  PAST MEDICAL HISTORY: Past Medical History:  Diagnosis Date  . Anxiety   . Chicken pox   . Depression   . Elevated testosterone level in female   . Emphysema of lung (HCC)     bronchitis  . Frequent headaches   . Hay fever   . Hypertension   . Idiopathic thrombocytopenic purpura (ITP) (HCC)   . Positive ANA (antinuclear antibody)     PAST SURGICAL HISTORY: Past Surgical History:  Procedure Laterality Date  . HEMATOMA EVACUATION N/A 04/07/2020   Procedure: EVACUATION HEMATOMA  AND APPLICATION OF PRESSURE DRESSING;  Surgeon: Benjaman Kindler, MD;  Location: [facility] ORS;  Service: Gynecology;  Laterality: N/A;  . HYSTEROSCOPY WITH NOVASURE N/A 02/21/2018   Procedure: HYSTEROSCOPY WITH NOVASURE, POLYPECTOMY;  Surgeon: Benjaman Kindler, MD;  Location: [facility] ORS;  Service: Gynecology;  Laterality: N/A;  . TUBAL LIGATION  1997  . TUBAL LIGATION      FAMILY HISTORY: Family History  Problem Relation Age of Onset  . Hyperlipidemia Mother   . Heart disease Mother   . Stroke Mother   . Depression Mother   . Mental illness Mother   . Heart attack Mother 69  . Hyperlipidemia Father   . Depression Father   . Mental illness Father   . Diabetes Paternal Grandfather   . Cancer Cousin     ADVANCED DIRECTIVES (Y/N):  N  HEALTH MAINTENANCE: Social History   Tobacco Use  . Smoking status: Current Every Day Smoker    Packs/day: 1.50    Years: 28.00    Pack years: 42.00    Types: Cigarettes  . Smokeless tobacco: Never Used  Vaping Use  . Vaping Use: Never used  Substance Use Topics  . Alcohol use: Yes    Alcohol/week: 8.0 standard drinks    Types: 8 Standard drinks or equivalent per week    Comment: Beer (Can)   .  Drug use: No    Comment: CBD oil     Colonoscopy:  PAP:  Bone density:  Lipid panel:  Allergies  Allergen Reactions  . Meloxicam Nausea Only    Current Outpatient Medications  Medication Sig Dispense Refill  . albuterol (PROVENTIL) (2.5 MG/3ML) 0.083% nebulizer solution Take 3 mLs (2.5 mg total) by nebulization every 6 (six) hours as needed for wheezing or shortness of breath. 360 mL 11  . albuterol (VENTOLIN HFA) 108 (90 Base) MCG/ACT  inhaler Inhale 2 puffs into the lungs every 6 (six) hours as needed for wheezing or shortness of breath. 18 g 2  . ALPRAZolam (XANAX) 0.25 MG tablet Take 1 tablet (0.25 mg total) by mouth daily as needed for anxiety. 30 tablet 2  . amLODipine (NORVASC) 10 MG tablet Take 1 tablet (10 mg total) by mouth daily. 90 tablet 3  . benzonatate (TESSALON) 100 MG capsule Take 1-2 capsules (100-200 mg total) by mouth 3 (three) times daily as needed for cough. 40 capsule 0  . carvedilol (COREG) 25 MG tablet Take 1 tablet (25 mg total) by mouth 2 (two) times daily with a meal. 180 tablet 3  . cetirizine (ZYRTEC) 10 MG tablet Take 1 tablet (10 mg total) by mouth daily. 90 tablet 3  . Cholecalciferol 1.25 MG (50000 UT) capsule Take 1 capsule (50,000 Units total) by mouth every 30 (thirty) days. 1x per month note this 13 capsule 0  . cyanocobalamin (,VITAMIN B-12,) 1000 MCG/ML injection Inject 1 mL (1,000 mcg total) into the muscle every 30 (thirty) days. 8 mL 1  . cyclobenzaprine (FLEXERIL) 10 MG tablet Take 1 tablet (10 mg total) by mouth 3 (three) times daily as needed for muscle spasms. 30 tablet 0  . diclofenac Sodium (VOLTAREN) 1 % GEL Apply 2-4 g topically 4 (four) times daily. 2 grams upper body qid prn and 4 gram lower body qid prn 150 g 11  . dicyclomine (BENTYL) 10 MG capsule Take 1 capsule (10 mg total) by mouth 3 (three) times daily as needed for spasms. Before food 270 capsule 3  . escitalopram (LEXAPRO) 20 MG tablet Take 1 tablet (20 mg total) by mouth daily. 90 tablet 3  . fluticasone (FLONASE) 50 MCG/ACT nasal spray Place 2 sprays into both nostrils daily. 16 g 11  . Fluticasone-Umeclidin-Vilant (TRELEGY ELLIPTA) 200-62.5-25 MCG/INH AEPB Inhale 1 puff into the lungs daily. 28 each 11  . mirabegron ER (MYRBETRIQ) 25 MG TB24 tablet Take 1 tablet (25 mg total) by mouth daily. 90 tablet 3  . omeprazole (PRILOSEC) 20 MG capsule Take 1 capsule (20 mg total) by mouth daily. 90 capsule 3  . ondansetron  (ZOFRAN) 4 MG tablet Take 1 tablet (4 mg total) by mouth every 8 (eight) hours as needed for nausea or vomiting. 40 tablet 2  . spironolactone (ALDACTONE) 25 MG tablet Take 1 tablet (25 mg total) by mouth daily. In am 90 tablet 3  . Syringe/Needle, Disp, 25G X 1-1/2" 1 ML MISC 1 Device by Does not apply route as directed. 30 each 11  . traZODone (DESYREL) 50 MG tablet Take 0.5-1 tablets (25-50 mg total) by mouth at bedtime as needed for sleep. 90 tablet 3  . levofloxacin (LEVAQUIN) 750 MG tablet Take 1 tablet (750 mg total) by mouth daily. In am (Patient not taking: Reported on 09/02/2020) 5 tablet 0  . nicotine (NICODERM CQ - DOSED IN MG/24 HOURS) 14 mg/24hr patch Place 1 patch (14 mg total) onto the skin daily. (Patient  not taking: Reported on 09/02/2020) 60 patch 0  . nicotine (NICODERM CQ - DOSED IN MG/24 HOURS) 21 mg/24hr patch Place 1 patch (21 mg total) onto the skin daily. (Patient not taking: Reported on 09/02/2020) 28 patch 0  . nicotine (NICODERM CQ - DOSED IN MG/24 HR) 7 mg/24hr patch Place 1 patch (7 mg total) onto the skin daily. (Patient not taking: Reported on 09/02/2020) 30 patch 0  . nicotine polacrilex (NICORETTE) 4 MG gum Take 1 each (4 mg total) by mouth as needed for smoking cessation. Chew 1 piece every 1-2 hours max 24 pieces per day x 1-6 weeks then week 7-9 every 2-4 hours and week 10-12 every 4-8 hours (Patient not taking: No sig reported) 300 tablet 5  . predniSONE (DELTASONE) 20 MG tablet Take 2 tablets (40 mg total) by mouth daily with breakfast. In am (Patient not taking: Reported on 09/02/2020) 14 tablet 0  . predniSONE (STERAPRED UNI-PAK 21 TAB) 10 MG (21) TBPK tablet Use as directed. (Patient not taking: Reported on 09/02/2020) 21 each 0   No current facility-administered medications for this visit.   Facility-Administered Medications Ordered in Other Visits  Medication Dose Route Frequency Provider Last Rate Last Admin  . romiPLOStim (NPLATE) injection 265 mcg  3 mcg/kg  Subcutaneous Once Lloyd Huger, MD        OBJECTIVE: Vitals:   09/02/20 1436  BP: 129/75  Pulse: 72  Resp: 16  Temp: 98.8 F (37.1 C)  SpO2: 97%     Body mass index is 35.01 kg/m.    ECOG FS:0 - Asymptomatic  Physical Exam Constitutional:      General: Vital signs are normal.     Appearance: Normal appearance.  HENT:     Head: Normocephalic and atraumatic.  Eyes:     Pupils: Pupils are equal, round, and reactive to light.  Cardiovascular:     Rate and Rhythm: Normal rate and regular rhythm.     Heart sounds: Normal heart sounds. No murmur heard.   Pulmonary:     Effort: Pulmonary effort is normal.     Breath sounds: Normal breath sounds. No wheezing.  Abdominal:     General: Bowel sounds are normal. There is no distension.     Palpations: Abdomen is soft.     Tenderness: There is no abdominal tenderness.  Musculoskeletal:        General: No edema. Normal range of motion.     Cervical back: Normal range of motion.  Skin:    General: Skin is warm and dry.     Findings: No rash.  Neurological:     Mental Status: She is alert and oriented to person, place, and time.  Psychiatric:        Judgment: Judgment normal.       LAB RESULTS:  Lab Results  Component Value Date   NA 140 04/08/2020   K 4.6 04/08/2020   CL 104 04/08/2020   CO2 25 04/08/2020   GLUCOSE 131 (H) 04/08/2020   BUN 6 04/08/2020   CREATININE 0.77 04/08/2020   CALCIUM 8.5 (L) 04/08/2020   PROT 6.7 04/07/2020   ALBUMIN 3.8 04/07/2020   AST 52 (H) 04/07/2020   ALT 27 04/07/2020   ALKPHOS 58 04/07/2020   BILITOT 0.9 04/07/2020   GFRNONAA >60 04/08/2020   GFRAA >60 04/08/2020    Lab Results  Component Value Date   WBC 8.0 09/02/2020   NEUTROABS 5.0 09/02/2020   HGB 15.6 (H) 09/02/2020  HCT 47.0 (H) 09/02/2020   MCV 98.7 09/02/2020   PLT 35 (L) 09/02/2020     STUDIES: No results found.  ASSESSMENT: ITP  PLAN:    1.  ITP:  -Confirmed by bone marrow with normal FISH  and cytogenetics on 07/22/2019. -Patient had positive ANA which is likely clinically insignificant. -Remainder of lab work was either normal or within normal range. -CT scan from 03/04/2017 did not reveal splenomegaly. -Initially started on prednisone with no appreciable improvement. -Rituxan was denied by insurance. -Goal is to maintain platelet count above 100. -Labs from today show a platelet count of 35. -Proceed with Nplate today.  Disposition: -RTC in 3 weeks for repeat labs (CBC with differential) and possible Nplate.  Greater than 50% was spent in counseling and coordination of care with this patient including but not limited to discussion of the relevant topics above (See A&P) including, but not limited to diagnosis and management of acute and chronic medical conditions.    Patient expressed understanding and was in agreement with this plan. She also understands that She can call clinic at any time with any questions, concerns, or complaints.    Jacquelin Hawking, NP   09/02/2020 3:20 PM

## 2020-09-14 ENCOUNTER — Telehealth: Payer: BC Managed Care – PPO | Admitting: Nurse Practitioner

## 2020-09-14 DIAGNOSIS — M545 Low back pain, unspecified: Secondary | ICD-10-CM | POA: Diagnosis not present

## 2020-09-14 MED ORDER — NAPROXEN 500 MG PO TABS: 500.0000 mg | ORAL_TABLET | Freq: Two times a day (BID) | ORAL | 0 refills | Status: DC

## 2020-09-14 MED ORDER — CYCLOBENZAPRINE HCL 10 MG PO TABS: 10.0000 mg | ORAL_TABLET | Freq: Three times a day (TID) | ORAL | 0 refills | Status: DC | PRN

## 2020-09-14 NOTE — Progress Notes (Signed)

## 2020-09-23 ENCOUNTER — Encounter: Payer: Self-pay | Admitting: Oncology

## 2020-09-23 ENCOUNTER — Inpatient Hospital Stay: Payer: BC Managed Care – PPO

## 2020-09-23 ENCOUNTER — Inpatient Hospital Stay: Payer: BC Managed Care – PPO | Attending: Oncology | Admitting: Hematology and Oncology

## 2020-09-23 ENCOUNTER — Inpatient Hospital Stay (HOSPITAL_BASED_OUTPATIENT_CLINIC_OR_DEPARTMENT_OTHER): Payer: BC Managed Care – PPO | Admitting: Oncology

## 2020-09-23 VITALS — BP 113/81 | HR 77 | Temp 99.0°F | Wt 197.4 lb

## 2020-09-23 DIAGNOSIS — Z79899 Other long term (current) drug therapy: Secondary | ICD-10-CM | POA: Insufficient documentation

## 2020-09-23 DIAGNOSIS — D693 Immune thrombocytopenic purpura: Secondary | ICD-10-CM

## 2020-09-23 LAB — CBC WITH DIFFERENTIAL/PLATELET
Abs Immature Granulocytes: 0.02 10*3/uL (ref 0.00–0.07)
Basophils Absolute: 0.1 10*3/uL (ref 0.0–0.1)
Basophils Relative: 1 %
Eosinophils Absolute: 0.3 10*3/uL (ref 0.0–0.5)
Eosinophils Relative: 3 %
HCT: 43.6 % (ref 36.0–46.0)
Hemoglobin: 14.6 g/dL (ref 12.0–15.0)
Immature Granulocytes: 0 %
Lymphocytes Relative: 26 %
Lymphs Abs: 2.2 10*3/uL (ref 0.7–4.0)
MCH: 33.3 pg (ref 26.0–34.0)
MCHC: 33.5 g/dL (ref 30.0–36.0)
MCV: 99.3 fL (ref 80.0–100.0)
Monocytes Absolute: 0.7 10*3/uL (ref 0.1–1.0)
Monocytes Relative: 9 %
Neutro Abs: 5 10*3/uL (ref 1.7–7.7)
Neutrophils Relative %: 61 %
Platelets: 50 10*3/uL — ABNORMAL LOW (ref 150–400)
RBC: 4.39 MIL/uL (ref 3.87–5.11)
RDW: 14.2 % (ref 11.5–15.5)
WBC: 8.3 10*3/uL (ref 4.0–10.5)
nRBC: 0 % (ref 0.0–0.2)

## 2020-09-23 MED ORDER — TRAMADOL HCL 50 MG PO TABS: 50.0000 mg | ORAL_TABLET | Freq: Four times a day (QID) | ORAL | 0 refills | Status: DC | PRN

## 2020-09-23 MED ORDER — ROMIPLOSTIM INJECTION 500 MCG
3.0000 ug/kg | Freq: Once | SUBCUTANEOUS | Status: AC
  Administered 2020-09-23: 270 ug via SUBCUTANEOUS
  Filled 2020-09-23: qty 0.54

## 2020-09-23 NOTE — Progress Notes (Signed)
[name]  Telephone:(336) [phone] Fax:(336) [phone]  ID: Carly [name] OB: 1971-04-06  MR#: [id number]  ZSW#:[id number]  Patient Care Team: McLean-Scocuzza, Nino Glow, MD as PCP - General (Internal Medicine) Lloyd Huger, MD as Consulting Physician (Oncology)   CHIEF COMPLAINT: ITP.  INTERVAL HISTORY: Patient returns to clinic today for repeat laboratory work, further evaluation, and continuation of Nplate. She last received Nplate on 10/04/00.  She continues to feel well and remains asymptomatic.  Endorses some bilateral foot pain secondary to plantar fasciitis.  Has been taking 1600 mg ibuprofen daily with relief.  She denies any easy bleeding or bruising. She has no neurologic complaints.  She denies any recent fevers or illnesses.  She has a good appetite and denies weight loss.  She denies any chest pain, shortness of breath, cough, or hemoptysis. She denies any nausea, vomiting, constipation, or diarrhea.  She has no urinary complaints.  Patient feels at her baseline offers no specific complaints today.  REVIEW OF SYSTEMS:   Review of Systems  Constitutional: Negative.  Negative for fever, malaise/fatigue and weight loss.  Respiratory: Negative.  Negative for cough, hemoptysis and shortness of breath.   Cardiovascular: Negative.  Negative for chest pain and leg swelling.  Gastrointestinal: Negative.  Negative for abdominal pain, blood in stool and melena.  Genitourinary: Negative.  Negative for hematuria.  Musculoskeletal: Negative.  Negative for back pain.       Bilateral foot pain secondary to plantar fascitis  Skin: Negative.  Negative for rash.  Neurological: Negative.  Negative for dizziness, focal weakness, weakness and headaches.  Endo/Heme/Allergies: Negative.  Does not bruise/bleed easily.  Psychiatric/Behavioral: Negative.  The patient is not nervous/anxious.     As per HPI. Otherwise, a complete review of systems is negative.  PAST MEDICAL  HISTORY: Past Medical History:  Diagnosis Date  . Anxiety   . Chicken pox   . Depression   . Elevated testosterone level in female   . Emphysema of lung (HCC)    bronchitis  . Frequent headaches   . Hay fever   . Hypertension   . Idiopathic thrombocytopenic purpura (ITP) (HCC)   . Positive ANA (antinuclear antibody)     PAST SURGICAL HISTORY: Past Surgical History:  Procedure Laterality Date  . HEMATOMA EVACUATION N/A 04/07/2020   Procedure: EVACUATION HEMATOMA  AND APPLICATION OF PRESSURE DRESSING;  Surgeon: Benjaman Kindler, MD;  Location: [facility] ORS;  Service: Gynecology;  Laterality: N/A;  . HYSTEROSCOPY WITH NOVASURE N/A 02/21/2018   Procedure: HYSTEROSCOPY WITH NOVASURE, POLYPECTOMY;  Surgeon: Benjaman Kindler, MD;  Location: [facility] ORS;  Service: Gynecology;  Laterality: N/A;  . TUBAL LIGATION  1997  . TUBAL LIGATION      FAMILY HISTORY: Family History  Problem Relation Age of Onset  . Hyperlipidemia Mother   . Heart disease Mother   . Stroke Mother   . Depression Mother   . Mental illness Mother   . Heart attack Mother 44  . Hyperlipidemia Father   . Depression Father   . Mental illness Father   . Diabetes Paternal Grandfather   . Cancer Cousin     ADVANCED DIRECTIVES (Y/N):  N  HEALTH MAINTENANCE: Social History   Tobacco Use  . Smoking status: Current Every Day Smoker    Packs/day: 1.50    Years: 28.00    Pack years: 42.00    Types: Cigarettes  . Smokeless tobacco: Never Used  Vaping Use  . Vaping Use: Never used  Substance Use Topics  .  Alcohol use: Yes    Alcohol/week: 8.0 standard drinks    Types: 8 Standard drinks or equivalent per week    Comment: Beer (Can)   . Drug use: No    Comment: CBD oil     Colonoscopy:  PAP:  Bone density:  Lipid panel:  Allergies  Allergen Reactions  . Meloxicam Nausea Only    Current Outpatient Medications  Medication Sig Dispense Refill  . albuterol (PROVENTIL) (2.5 MG/3ML) 0.083% nebulizer solution  Take 3 mLs (2.5 mg total) by nebulization every 6 (six) hours as needed for wheezing or shortness of breath. 360 mL 11  . albuterol (VENTOLIN HFA) 108 (90 Base) MCG/ACT inhaler Inhale 2 puffs into the lungs every 6 (six) hours as needed for wheezing or shortness of breath. 18 g 2  . ALPRAZolam (XANAX) 0.25 MG tablet Take 1 tablet (0.25 mg total) by mouth daily as needed for anxiety. 30 tablet 2  . amLODipine (NORVASC) 10 MG tablet Take 1 tablet (10 mg total) by mouth daily. 90 tablet 3  . benzonatate (TESSALON) 100 MG capsule Take 1-2 capsules (100-200 mg total) by mouth 3 (three) times daily as needed for cough. 40 capsule 0  . carvedilol (COREG) 25 MG tablet Take 1 tablet (25 mg total) by mouth 2 (two) times daily with a meal. 180 tablet 3  . cetirizine (ZYRTEC) 10 MG tablet Take 1 tablet (10 mg total) by mouth daily. 90 tablet 3  . Cholecalciferol 1.25 MG (50000 UT) capsule Take 1 capsule (50,000 Units total) by mouth every 30 (thirty) days. 1x per month note this 13 capsule 0  . cyanocobalamin (,VITAMIN B-12,) 1000 MCG/ML injection Inject 1 mL (1,000 mcg total) into the muscle every 30 (thirty) days. 8 mL 1  . cyclobenzaprine (FLEXERIL) 10 MG tablet Take 1 tablet (10 mg total) by mouth 3 (three) times daily as needed for muscle spasms. 30 tablet 0  . diclofenac Sodium (VOLTAREN) 1 % GEL Apply 2-4 g topically 4 (four) times daily. 2 grams upper body qid prn and 4 gram lower body qid prn 150 g 11  . dicyclomine (BENTYL) 10 MG capsule Take 1 capsule (10 mg total) by mouth 3 (three) times daily as needed for spasms. Before food 270 capsule 3  . escitalopram (LEXAPRO) 20 MG tablet Take 1 tablet (20 mg total) by mouth daily. 90 tablet 3  . fluticasone (FLONASE) 50 MCG/ACT nasal spray Place 2 sprays into both nostrils daily. 16 g 11  . Fluticasone-Umeclidin-Vilant (TRELEGY ELLIPTA) 200-62.5-25 MCG/INH AEPB Inhale 1 puff into the lungs daily. 28 each 11  . levofloxacin (LEVAQUIN) 750 MG tablet Take 1  tablet (750 mg total) by mouth daily. In am (Patient not taking: Reported on 09/02/2020) 5 tablet 0  . mirabegron ER (MYRBETRIQ) 25 MG TB24 tablet Take 1 tablet (25 mg total) by mouth daily. 90 tablet 3  . naproxen (NAPROSYN) 500 MG tablet Take 1 tablet (500 mg total) by mouth 2 (two) times daily with a meal. 30 tablet 0  . nicotine (NICODERM CQ - DOSED IN MG/24 HOURS) 14 mg/24hr patch Place 1 patch (14 mg total) onto the skin daily. (Patient not taking: Reported on 09/02/2020) 60 patch 0  . nicotine (NICODERM CQ - DOSED IN MG/24 HOURS) 21 mg/24hr patch Place 1 patch (21 mg total) onto the skin daily. (Patient not taking: Reported on 09/02/2020) 28 patch 0  . nicotine (NICODERM CQ - DOSED IN MG/24 HR) 7 mg/24hr patch Place 1 patch (7 mg total)  onto the skin daily. (Patient not taking: Reported on 09/02/2020) 30 patch 0  . nicotine polacrilex (NICORETTE) 4 MG gum Take 1 each (4 mg total) by mouth as needed for smoking cessation. Chew 1 piece every 1-2 hours max 24 pieces per day x 1-6 weeks then week 7-9 every 2-4 hours and week 10-12 every 4-8 hours (Patient not taking: No sig reported) 300 tablet 5  . omeprazole (PRILOSEC) 20 MG capsule Take 1 capsule (20 mg total) by mouth daily. 90 capsule 3  . ondansetron (ZOFRAN) 4 MG tablet Take 1 tablet (4 mg total) by mouth every 8 (eight) hours as needed for nausea or vomiting. 40 tablet 2  . predniSONE (DELTASONE) 20 MG tablet Take 2 tablets (40 mg total) by mouth daily with breakfast. In am (Patient not taking: Reported on 09/02/2020) 14 tablet 0  . predniSONE (STERAPRED UNI-PAK 21 TAB) 10 MG (21) TBPK tablet Use as directed. (Patient not taking: Reported on 09/02/2020) 21 each 0  . spironolactone (ALDACTONE) 25 MG tablet Take 1 tablet (25 mg total) by mouth daily. In am 90 tablet 3  . Syringe/Needle, Disp, 25G X 1-1/2" 1 ML MISC 1 Device by Does not apply route as directed. 30 each 11  . traZODone (DESYREL) 50 MG tablet Take 0.5-1 tablets (25-50 mg total) by mouth  at bedtime as needed for sleep. 90 tablet 3   No current facility-administered medications for this visit.    OBJECTIVE: There were no vitals filed for this visit.   There is no height or weight on file to calculate BMI.    ECOG FS:0 - Asymptomatic  Physical Exam Constitutional:      Appearance: Normal appearance.  HENT:     Head: Normocephalic and atraumatic.  Eyes:     Pupils: Pupils are equal, round, and reactive to light.  Cardiovascular:     Rate and Rhythm: Normal rate and regular rhythm.     Heart sounds: Normal heart sounds. No murmur heard.   Pulmonary:     Effort: Pulmonary effort is normal.     Breath sounds: Normal breath sounds. No wheezing.  Abdominal:     General: Bowel sounds are normal. There is no distension.     Palpations: Abdomen is soft.     Tenderness: There is no abdominal tenderness.  Musculoskeletal:        General: Normal range of motion.     Cervical back: Normal range of motion.  Skin:    General: Skin is warm and dry.     Findings: No rash.  Neurological:     Mental Status: She is alert and oriented to person, place, and time.  Psychiatric:        Judgment: Judgment normal.       LAB RESULTS:  Lab Results  Component Value Date   NA 140 04/08/2020   K 4.6 04/08/2020   CL 104 04/08/2020   CO2 25 04/08/2020   GLUCOSE 131 (H) 04/08/2020   BUN 6 04/08/2020   CREATININE 0.77 04/08/2020   CALCIUM 8.5 (L) 04/08/2020   PROT 6.7 04/07/2020   ALBUMIN 3.8 04/07/2020   AST 52 (H) 04/07/2020   ALT 27 04/07/2020   ALKPHOS 58 04/07/2020   BILITOT 0.9 04/07/2020   GFRNONAA >60 04/08/2020   GFRAA >60 04/08/2020    Lab Results  Component Value Date   WBC 8.0 09/02/2020   NEUTROABS 5.0 09/02/2020   HGB 15.6 (H) 09/02/2020   HCT 47.0 (H) 09/02/2020  MCV 98.7 09/02/2020   PLT 35 (L) 09/02/2020     STUDIES: No results found.  ASSESSMENT: ITP  PLAN:    1.  ITP:  -Confirmed by bone marrow with normal FISH and cytogenetics on  07/22/2019. -Patient had positive ANA which is likely clinically insignificant. -Remainder of lab work was either normal or within normal range. -CT scan from 03/04/2017 did not reveal splenomegaly. -Initially started on prednisone with no appreciable improvement. -Rituxan was denied by insurance. -Goal is to maintain platelet count above 100. -Labs from today show a platelet count of 50. -Proceed with Nplate today.  2. Plantar Fascitis: -Currently taking ibuprofen 600 mg daily. -Recommend follow-up with PCP or Ortho. -Can trial tramadol 50 to 100 mg every 6-8 hours as needed (#30tabs).  Disposition: -RTC in 3 weeks for repeat labs (CBC with differential) and possible Nplate and in 6 weeks with labs, MD assessment and Nplate..   Greater than 50% was spent in counseling and coordination of care with this patient including but not limited to discussion of the relevant topics above (See A&P) including, but not limited to diagnosis and management of acute and chronic medical conditions.    Patient expressed understanding and was in agreement with this plan. She also understands that She can call clinic at any time with any questions, concerns, or complaints.    Jacquelin Hawking, NP   09/23/2020 1:43 PM

## 2020-10-14 ENCOUNTER — Inpatient Hospital Stay: Payer: BC Managed Care – PPO | Attending: Oncology

## 2020-10-14 ENCOUNTER — Inpatient Hospital Stay: Payer: BC Managed Care – PPO

## 2020-10-14 ENCOUNTER — Other Ambulatory Visit: Payer: Self-pay

## 2020-10-14 DIAGNOSIS — D693 Immune thrombocytopenic purpura: Secondary | ICD-10-CM | POA: Insufficient documentation

## 2020-10-14 DIAGNOSIS — Z79899 Other long term (current) drug therapy: Secondary | ICD-10-CM | POA: Diagnosis not present

## 2020-10-14 LAB — CBC WITH DIFFERENTIAL/PLATELET
Abs Immature Granulocytes: 0.03 10*3/uL (ref 0.00–0.07)
Basophils Absolute: 0.1 10*3/uL (ref 0.0–0.1)
Basophils Relative: 1 %
Eosinophils Absolute: 0.4 10*3/uL (ref 0.0–0.5)
Eosinophils Relative: 4 %
HCT: 45.2 % (ref 36.0–46.0)
Hemoglobin: 14.9 g/dL (ref 12.0–15.0)
Immature Granulocytes: 0 %
Lymphocytes Relative: 25 %
Lymphs Abs: 2.3 10*3/uL (ref 0.7–4.0)
MCH: 33 pg (ref 26.0–34.0)
MCHC: 33 g/dL (ref 30.0–36.0)
MCV: 100 fL (ref 80.0–100.0)
Monocytes Absolute: 0.8 10*3/uL (ref 0.1–1.0)
Monocytes Relative: 9 %
Neutro Abs: 5.6 10*3/uL (ref 1.7–7.7)
Neutrophils Relative %: 61 %
Platelets: 47 10*3/uL — ABNORMAL LOW (ref 150–400)
RBC: 4.52 MIL/uL (ref 3.87–5.11)
RDW: 14 % (ref 11.5–15.5)
Smear Review: NORMAL
WBC: 9.2 10*3/uL (ref 4.0–10.5)
nRBC: 0 % (ref 0.0–0.2)

## 2020-10-14 MED ORDER — ROMIPLOSTIM INJECTION 500 MCG
270.0000 ug | Freq: Once | SUBCUTANEOUS | Status: AC
  Administered 2020-10-14: 270 ug via SUBCUTANEOUS
  Filled 2020-10-14: qty 0.54

## 2020-10-17 ENCOUNTER — Other Ambulatory Visit: Payer: Self-pay | Admitting: Oncology

## 2020-10-17 ENCOUNTER — Encounter: Payer: Self-pay | Admitting: Oncology

## 2020-10-17 MED ORDER — TRAMADOL HCL 50 MG PO TABS: 50.0000 mg | ORAL_TABLET | Freq: Four times a day (QID) | ORAL | 0 refills | Status: DC | PRN

## 2020-10-17 NOTE — Progress Notes (Signed)
Re: Plantar Fascitis  Refilled tramadol. I have asked to reach out to PCP or ortho for further guidance for pain control given this is a chronic issue.   Faythe Casa, NP 10/17/2020 9:34 AM

## 2020-10-19 DIAGNOSIS — M722 Plantar fascial fibromatosis: Secondary | ICD-10-CM | POA: Diagnosis not present

## 2020-10-20 ENCOUNTER — Other Ambulatory Visit: Payer: Self-pay | Admitting: Internal Medicine

## 2020-10-20 DIAGNOSIS — F419 Anxiety disorder, unspecified: Secondary | ICD-10-CM

## 2020-10-29 ENCOUNTER — Telehealth: Payer: BC Managed Care – PPO | Admitting: Physician Assistant

## 2020-10-29 DIAGNOSIS — J441 Chronic obstructive pulmonary disease with (acute) exacerbation: Secondary | ICD-10-CM | POA: Diagnosis not present

## 2020-10-29 MED ORDER — PREDNISONE 10 MG (21) PO TBPK: ORAL_TABLET | ORAL | 0 refills | Status: DC

## 2020-10-29 MED ORDER — BENZONATATE 200 MG PO CAPS: 200.0000 mg | ORAL_CAPSULE | Freq: Three times a day (TID) | ORAL | 0 refills | Status: DC | PRN

## 2020-10-29 NOTE — Progress Notes (Signed)
We are sorry that you are not feeling well.  Here is how we plan to help!  Based on your presentation I believe you most likely have A cough due to allergies.  I recommend that you start the an over-the counter-allergy medication such as Claritin 10 mg or Zyrtec 10 mg daily.     In addition you may use A prescription cough medication called Tessalon Perles 100mg . You may take 1-2 capsules every 8 hours as needed for your cough.  Prednisone 10 mg daily for 6 days (see taper instructions below)  Day 1: Take 2 tablets with breakfast, 1 tablet with lunch, 1 tablet with supper, and 2 tablets at bedtime Day 2: Take 2 tablets at breakfast, 1 tablet with lunch, 1 tablet with supper, 1 tablet at bedtime Day 3: Take 1 tablet at breakfast, 1 tablet at lunch, 1 tablet at supper, 1 tablet at bedtime Day 4: Take 1 tablet with breakfast, 1 tablet with supper, 1 tablet at bedtime Day 5: Take 1 tablet with breakfast and 1 tablet at bedtime Day 6: Take 1 tablet with breakfast  From your responses in the eVisit questionnaire you describe inflammation in the upper respiratory tract which is causing a significant cough.  This is commonly called Bronchitis and has four common causes:    Allergies  Viral Infections  Acid Reflux  Bacterial Infection Allergies, viruses and acid reflux are treated by controlling symptoms or eliminating the cause. An example might be a cough caused by taking certain blood pressure medications. You stop the cough by changing the medication. Another example might be a cough caused by acid reflux. Controlling the reflux helps control the cough.  USE OF BRONCHODILATOR ("RESCUE") INHALERS: There is a risk from using your bronchodilator too frequently.  The risk is that over-reliance on a medication which only relaxes the muscles surrounding the breathing tubes can reduce the effectiveness of medications prescribed to reduce swelling and congestion of the tubes themselves.  Although you  feel brief relief from the bronchodilator inhaler, your asthma may actually be worsening with the tubes becoming more swollen and filled with mucus.  This can delay other crucial treatments, such as oral steroid medications. If you need to use a bronchodilator inhaler daily, several times per day, you should discuss this with your provider.  There are probably better treatments that could be used to keep your asthma under control.     HOME CARE . Only take medications as instructed by your medical team. . Complete the entire course of an antibiotic. . Drink plenty of fluids and get plenty of rest. . Avoid close contacts especially the very young and the elderly . Cover your mouth if you cough or cough into your sleeve. . Always remember to wash your hands . A steam or ultrasonic humidifier can help congestion.   GET HELP RIGHT AWAY IF: . You develop worsening fever. . You become short of breath . You cough up blood. . Your symptoms persist after you have completed your treatment plan MAKE SURE YOU   Understand these instructions.  Will watch your condition.  Will get help right away if you are not doing well or get worse.  Your e-visit answers were reviewed by a board certified advanced clinical practitioner to complete your personal care plan.  Depending on the condition, your plan could have included both over the counter or prescription medications. If there is a problem please reply  once you have received a response from your provider.  Your safety is important to [place].  If you have drug allergies check your prescription carefully.    You can use MyChart to ask questions about today's visit, request a non-urgent call back, or ask for a work or school excuse for 24 hours related to this e-Visit. If it has been greater than 24 hours you will need to follow up with your provider, or enter a new e-Visit to address those concerns. You will get an e-mail in the next two days asking about  your experience.  I hope that your e-visit has been valuable and will speed your recovery. Thank you for using e-visits.  I provided 6 minutes of non face-to-face time during this encounter for chart review and documentation.

## 2020-10-31 ENCOUNTER — Other Ambulatory Visit: Payer: Self-pay

## 2020-10-31 MED ORDER — ALBUTEROL SULFATE HFA 108 (90 BASE) MCG/ACT IN AERS: 2.0000 | INHALATION_SPRAY | Freq: Four times a day (QID) | RESPIRATORY_TRACT | 1 refills | Status: DC | PRN

## 2020-10-31 MED ORDER — AZITHROMYCIN 250 MG PO TABS: ORAL_TABLET | ORAL | 0 refills | Status: AC

## 2020-10-31 NOTE — Telephone Encounter (Signed)
Rx for proair has been sent to [facility] as requested by pharmacy.  Nothing further needed at this time.

## 2020-10-31 NOTE — Telephone Encounter (Signed)
Recommend Mucinex extra strength 1200 mg p.o. twice daily.  Continue prednisone.  May call in a Z-Pak.  Ultimately needs to give it time.  She also needs to make follow-up appointment that she did not follow-up as recommended in 3 months after her initial evaluation in October.

## 2020-10-31 NOTE — Telephone Encounter (Signed)
Dr. Gonzalez, please advise. Thanks 

## 2020-11-02 ENCOUNTER — Encounter: Payer: Self-pay | Admitting: Internal Medicine

## 2020-11-02 DIAGNOSIS — M722 Plantar fascial fibromatosis: Secondary | ICD-10-CM

## 2020-11-02 DIAGNOSIS — M170 Bilateral primary osteoarthritis of knee: Secondary | ICD-10-CM

## 2020-11-02 DIAGNOSIS — E538 Deficiency of other specified B group vitamins: Secondary | ICD-10-CM

## 2020-11-03 MED ORDER — DICLOFENAC SODIUM 1 % EX GEL: 2.0000 g | Freq: Four times a day (QID) | CUTANEOUS | 11 refills | Status: DC

## 2020-11-03 MED ORDER — "SYRINGE/NEEDLE (DISP) 25G X 1-1/2"" 1 ML MISC": 1.0000 | 11 refills | Status: DC

## 2020-11-04 ENCOUNTER — Inpatient Hospital Stay (HOSPITAL_BASED_OUTPATIENT_CLINIC_OR_DEPARTMENT_OTHER): Payer: BC Managed Care – PPO | Admitting: Oncology

## 2020-11-04 ENCOUNTER — Inpatient Hospital Stay: Payer: BC Managed Care – PPO

## 2020-11-04 ENCOUNTER — Encounter: Payer: Self-pay | Admitting: Oncology

## 2020-11-04 VITALS — BP 132/82 | HR 86 | Temp 97.8°F | Resp 20 | Wt 194.4 lb

## 2020-11-04 DIAGNOSIS — D693 Immune thrombocytopenic purpura: Secondary | ICD-10-CM

## 2020-11-04 DIAGNOSIS — Z79899 Other long term (current) drug therapy: Secondary | ICD-10-CM | POA: Diagnosis not present

## 2020-11-04 LAB — CBC WITH DIFFERENTIAL/PLATELET
Abs Immature Granulocytes: 0.11 10*3/uL — ABNORMAL HIGH (ref 0.00–0.07)
Basophils Absolute: 0 10*3/uL (ref 0.0–0.1)
Basophils Relative: 0 %
Eosinophils Absolute: 0 10*3/uL (ref 0.0–0.5)
Eosinophils Relative: 0 %
HCT: 47.7 % — ABNORMAL HIGH (ref 36.0–46.0)
Hemoglobin: 16.5 g/dL — ABNORMAL HIGH (ref 12.0–15.0)
Immature Granulocytes: 1 %
Lymphocytes Relative: 13 %
Lymphs Abs: 1.4 10*3/uL (ref 0.7–4.0)
MCH: 32.7 pg (ref 26.0–34.0)
MCHC: 34.6 g/dL (ref 30.0–36.0)
MCV: 94.6 fL (ref 80.0–100.0)
Monocytes Absolute: 0.6 10*3/uL (ref 0.1–1.0)
Monocytes Relative: 5 %
Neutro Abs: 9.1 10*3/uL — ABNORMAL HIGH (ref 1.7–7.7)
Neutrophils Relative %: 81 %
Platelets: 64 10*3/uL — ABNORMAL LOW (ref 150–400)
RBC: 5.04 MIL/uL (ref 3.87–5.11)
RDW: 13.4 % (ref 11.5–15.5)
Smear Review: DECREASED
WBC: 11.3 10*3/uL — ABNORMAL HIGH (ref 4.0–10.5)
nRBC: 0 % (ref 0.0–0.2)

## 2020-11-04 MED ORDER — ROMIPLOSTIM INJECTION 500 MCG
270.0000 ug | Freq: Once | SUBCUTANEOUS | Status: AC
  Administered 2020-11-04: 270 ug via SUBCUTANEOUS
  Filled 2020-11-04: qty 0.54

## 2020-11-04 NOTE — Progress Notes (Signed)
[place]  [phone] [phone]) [phone]  ID: Carly [name] OB: 09/07/1970  MR#: [id number]  BOF#:[id number]  Patient Care Team: McLean-Scocuzza, Nino Glow, MD as PCP - General (Internal Medicine) Lloyd Huger, MD as Consulting Physician (Oncology)   CHIEF COMPLAINT: ITP.  INTERVAL HISTORY: Patient returns to clinic today for repeat laboratory work, further evaluation, and continuation of Nplate. She last received Nplate on 01/19/81.  She continues to feel well and remains asymptomatic.  Endorses some bilateral foot pain secondary to plantar fasciitis but has f/u with foot doctor next week.  Has been taking tramadol 50 mg with relief. She denies any easy bleeding or bruising. She has no neurologic complaints.  She denies any recent fevers or illnesses.  She has a good appetite and denies weight loss.  She denies any chest pain, shortness of breath, cough, or hemoptysis. She denies any nausea, vomiting, constipation, or diarrhea.  She has no urinary complaints.  Patient feels at her baseline offers no specific complaints today.  REVIEW OF SYSTEMS:   Review of Systems  Constitutional: Negative.  Negative for fever, malaise/fatigue and weight loss.  Respiratory: Negative.  Negative for cough, hemoptysis and shortness of breath.   Cardiovascular: Negative.  Negative for chest pain and leg swelling.  Gastrointestinal: Negative.  Negative for abdominal pain, blood in stool and melena.  Genitourinary: Negative.  Negative for hematuria.  Musculoskeletal: Negative.  Negative for back pain.       Bilateral foot pain secondary to plantar fascitis  Skin: Negative.  Negative for rash.  Neurological: Negative.  Negative for dizziness, focal weakness, weakness and headaches.  Endo/Heme/Allergies: Negative.  Does not bruise/bleed easily.  Psychiatric/Behavioral: Negative.  The patient is not nervous/anxious.     As per HPI. Otherwise, a complete review of systems  is negative.  PAST MEDICAL HISTORY: Past Medical History:  Diagnosis Date  . Anxiety   . Chicken pox   . Depression   . Elevated testosterone level in female   . Emphysema of lung (HCC)    bronchitis  . Frequent headaches   . Hay fever   . Hypertension   . Idiopathic thrombocytopenic purpura (ITP) (HCC)   . Positive ANA (antinuclear antibody)     PAST SURGICAL HISTORY: Past Surgical History:  Procedure Laterality Date  . HEMATOMA EVACUATION N/A 04/07/2020   Procedure: EVACUATION HEMATOMA  AND APPLICATION OF PRESSURE DRESSING;  Surgeon: Benjaman Kindler, MD;  Location: [facility] ORS;  Service: Gynecology;  Laterality: N/A;  . HYSTEROSCOPY WITH NOVASURE N/A 02/21/2018   Procedure: HYSTEROSCOPY WITH NOVASURE, POLYPECTOMY;  Surgeon: Benjaman Kindler, MD;  Location: [facility] ORS;  Service: Gynecology;  Laterality: N/A;  . TUBAL LIGATION  1997  . TUBAL LIGATION      FAMILY HISTORY: Family History  Problem Relation Age of Onset  . Hyperlipidemia Mother   . Heart disease Mother   . Stroke Mother   . Depression Mother   . Mental illness Mother   . Heart attack Mother 28  . Hyperlipidemia Father   . Depression Father   . Mental illness Father   . Diabetes Paternal Grandfather   . Cancer Cousin     ADVANCED DIRECTIVES (Y/N):  N  HEALTH MAINTENANCE: Social History   Tobacco Use  . Smoking status: Current Every Day Smoker    Packs/day: 1.50    Years: 28.00    Pack years: 42.00    Types: Cigarettes  . Smokeless tobacco: Never Used  Vaping Use  . Vaping Use:  Never used  Substance Use Topics  . Alcohol use: Yes    Alcohol/week: 8.0 standard drinks    Types: 8 Standard drinks or equivalent per week    Comment: Beer (Can)   . Drug use: No    Comment: CBD oil     Colonoscopy:  PAP:  Bone density:  Lipid panel:  Allergies  Allergen Reactions  . Meloxicam Nausea Only    Current Outpatient Medications  Medication Sig Dispense Refill  . albuterol (PROAIR HFA) 108 (90  Base) MCG/ACT inhaler Inhale 2 puffs into the lungs every 6 (six) hours as needed for wheezing or shortness of breath. 18 g 1  . albuterol (PROVENTIL) (2.5 MG/3ML) 0.083% nebulizer solution Take 3 mLs (2.5 mg total) by nebulization every 6 (six) hours as needed for wheezing or shortness of breath. 360 mL 11  . ALPRAZolam (XANAX) 0.25 MG tablet TAKE 1 TABLET BY MOUTH DAILY AS NEEDED FOR ANXIETY 30 tablet 5  . amLODipine (NORVASC) 10 MG tablet Take 1 tablet (10 mg total) by mouth daily. 90 tablet 3  . azithromycin (ZITHROMAX) 250 MG tablet Take 2 tablets (500 mg) on  Day 1,  followed by 1 tablet (250 mg) once daily on Days 2 through 5. 6 each 0  . benzonatate (TESSALON) 200 MG capsule Take 1 capsule (200 mg total) by mouth 3 (three) times daily as needed for cough. 30 capsule 0  . carvedilol (COREG) 25 MG tablet Take 1 tablet (25 mg total) by mouth 2 (two) times daily with a meal. 180 tablet 3  . cetirizine (ZYRTEC) 10 MG tablet Take 1 tablet (10 mg total) by mouth daily. 90 tablet 3  . Cholecalciferol 1.25 MG (50000 UT) capsule Take 1 capsule (50,000 Units total) by mouth every 30 (thirty) days. 1x per month note this 13 capsule 0  . cyanocobalamin (,VITAMIN B-12,) 1000 MCG/ML injection Inject 1 mL (1,000 mcg total) into the muscle every 30 (thirty) days. 8 mL 1  . cyclobenzaprine (FLEXERIL) 10 MG tablet Take 1 tablet (10 mg total) by mouth 3 (three) times daily as needed for muscle spasms. 30 tablet 0  . diclofenac Sodium (VOLTAREN) 1 % GEL Apply 2-4 g topically 4 (four) times daily. 2 grams upper body qid prn and 4 gram lower body qid prn 150 g 11  . dicyclomine (BENTYL) 10 MG capsule Take 1 capsule (10 mg total) by mouth 3 (three) times daily as needed for spasms. Before food 270 capsule 3  . escitalopram (LEXAPRO) 20 MG tablet Take 1 tablet (20 mg total) by mouth daily. 90 tablet 3  . fluticasone (FLONASE) 50 MCG/ACT nasal spray Place 2 sprays into both nostrils daily. 16 g 11  .  Fluticasone-Umeclidin-Vilant (TRELEGY ELLIPTA) 200-62.5-25 MCG/INH AEPB Inhale 1 puff into the lungs daily. 28 each 11  . mirabegron ER (MYRBETRIQ) 25 MG TB24 tablet Take 1 tablet (25 mg total) by mouth daily. 90 tablet 3  . naproxen (NAPROSYN) 500 MG tablet Take 1 tablet (500 mg total) by mouth 2 (two) times daily with a meal. 30 tablet 0  . omeprazole (PRILOSEC) 20 MG capsule Take 1 capsule (20 mg total) by mouth daily. 90 capsule 3  . ondansetron (ZOFRAN) 4 MG tablet Take 1 tablet (4 mg total) by mouth every 8 (eight) hours as needed for nausea or vomiting. 40 tablet 2  . predniSONE (STERAPRED UNI-PAK 21 TAB) 10 MG (21) TBPK tablet 6 day taper; take as directed on package instruction 21 tablet 0  .  spironolactone (ALDACTONE) 25 MG tablet Take 1 tablet (25 mg total) by mouth daily. In am 90 tablet 3  . Syringe/Needle, Disp, 25G X 1-1/2" 1 ML MISC 1 Device by Does not apply route as directed. 30 each 11  . traMADol (ULTRAM) 50 MG tablet Take 1-2 tablets (50-100 mg total) by mouth every 6 (six) hours as needed. 60 tablet 0  . traZODone (DESYREL) 50 MG tablet Take 0.5-1 tablets (25-50 mg total) by mouth at bedtime as needed for sleep. 90 tablet 3   No current facility-administered medications for this visit.    OBJECTIVE: There were no vitals filed for this visit.   There is no height or weight on file to calculate BMI.    ECOG FS:0 - Asymptomatic  Physical Exam Constitutional:      Appearance: Normal appearance.  HENT:     Head: Normocephalic and atraumatic.  Eyes:     Pupils: Pupils are equal, round, and reactive to light.  Cardiovascular:     Rate and Rhythm: Normal rate and regular rhythm.     Heart sounds: Normal heart sounds. No murmur heard.   Pulmonary:     Effort: Pulmonary effort is normal.     Breath sounds: Normal breath sounds. No wheezing.  Abdominal:     General: Bowel sounds are normal. There is no distension.     Palpations: Abdomen is soft.     Tenderness: There is  no abdominal tenderness.  Musculoskeletal:        General: Normal range of motion.     Cervical back: Normal range of motion.  Skin:    General: Skin is warm and dry.     Findings: No rash.  Neurological:     Mental Status: She is alert and oriented to person, place, and time.  Psychiatric:        Judgment: Judgment normal.       LAB RESULTS:  Lab Results  Component Value Date   NA 140 04/08/2020   K 4.6 04/08/2020   CL 104 04/08/2020   CO2 25 04/08/2020   GLUCOSE 131 (H) 04/08/2020   BUN 6 04/08/2020   CREATININE 0.77 04/08/2020   CALCIUM 8.5 (L) 04/08/2020   PROT 6.7 04/07/2020   ALBUMIN 3.8 04/07/2020   AST 52 (H) 04/07/2020   ALT 27 04/07/2020   ALKPHOS 58 04/07/2020   BILITOT 0.9 04/07/2020   GFRNONAA >60 04/08/2020   GFRAA >60 04/08/2020    Lab Results  Component Value Date   WBC 9.2 10/14/2020   NEUTROABS 5.6 10/14/2020   HGB 14.9 10/14/2020   HCT 45.2 10/14/2020   MCV 100.0 10/14/2020   PLT 47 (L) 10/14/2020     STUDIES: No results found.  ASSESSMENT: ITP  PLAN:    1.  ITP:  -Confirmed by bone marrow with normal FISH and cytogenetics on 07/22/2019. -Patient had positive ANA which is likely clinically insignificant. -Remainder of lab work was either normal or within normal range. -CT scan from 03/04/2017 did not reveal splenomegaly. -Initially started on prednisone with no appreciable improvement. -Rituxan was denied by insurance. -Goal is to maintain platelet count above 100. -Labs from today show a platelet count of 64. -Proceed with Nplate today.  2. Plantar Fascitis: -Continue tramadol 50 to 100 mg every 6-8 hours as needed (#30tabs). --She is scheduled to see foot doctor next week.   Disposition: -RTC in 3 weeks for repeat labs (CBC with differential) and possible Nplate and in 6 weeks with labs,  MD assessment and Nplate..   Greater than 50% was spent in counseling and coordination of care with this patient including but not limited  to discussion of the relevant topics above (See A&P) including, but not limited to diagnosis and management of acute and chronic medical conditions.   Patient expressed understanding and was in agreement with this plan. She also understands that She can call clinic at any time with any questions, concerns, or complaints.    Jacquelin Hawking, NP   11/04/2020 11:03 AM

## 2020-11-04 NOTE — Progress Notes (Signed)
Patient states no concerns at the moment. 

## 2020-11-25 ENCOUNTER — Inpatient Hospital Stay: Payer: BC Managed Care – PPO

## 2020-11-25 ENCOUNTER — Inpatient Hospital Stay: Payer: BC Managed Care – PPO | Attending: Oncology

## 2020-11-25 DIAGNOSIS — Z79899 Other long term (current) drug therapy: Secondary | ICD-10-CM | POA: Insufficient documentation

## 2020-11-25 DIAGNOSIS — D693 Immune thrombocytopenic purpura: Secondary | ICD-10-CM

## 2020-11-25 LAB — CBC WITH DIFFERENTIAL/PLATELET
Abs Immature Granulocytes: 0.02 10*3/uL (ref 0.00–0.07)
Basophils Absolute: 0.1 10*3/uL (ref 0.0–0.1)
Basophils Relative: 1 %
Eosinophils Absolute: 0.3 10*3/uL (ref 0.0–0.5)
Eosinophils Relative: 4 %
HCT: 44.7 % (ref 36.0–46.0)
Hemoglobin: 15.3 g/dL — ABNORMAL HIGH (ref 12.0–15.0)
Immature Granulocytes: 0 %
Lymphocytes Relative: 32 %
Lymphs Abs: 2.5 10*3/uL (ref 0.7–4.0)
MCH: 33.1 pg (ref 26.0–34.0)
MCHC: 34.2 g/dL (ref 30.0–36.0)
MCV: 96.8 fL (ref 80.0–100.0)
Monocytes Absolute: 0.8 10*3/uL (ref 0.1–1.0)
Monocytes Relative: 10 %
Neutro Abs: 4.1 10*3/uL (ref 1.7–7.7)
Neutrophils Relative %: 53 %
Platelets: 33 10*3/uL — ABNORMAL LOW (ref 150–400)
RBC: 4.62 MIL/uL (ref 3.87–5.11)
RDW: 14 % (ref 11.5–15.5)
WBC: 7.7 10*3/uL (ref 4.0–10.5)
nRBC: 0 % (ref 0.0–0.2)

## 2020-11-25 MED ORDER — ROMIPLOSTIM INJECTION 500 MCG
270.0000 ug | Freq: Once | SUBCUTANEOUS | Status: AC
Start: 2020-11-25 — End: 2020-11-25
  Administered 2020-11-25: 270 ug via SUBCUTANEOUS
  Filled 2020-11-25: qty 0.54

## 2020-11-28 DIAGNOSIS — M722 Plantar fascial fibromatosis: Secondary | ICD-10-CM | POA: Diagnosis not present

## 2020-11-28 DIAGNOSIS — M7751 Other enthesopathy of right foot: Secondary | ICD-10-CM | POA: Diagnosis not present

## 2020-11-28 DIAGNOSIS — M79671 Pain in right foot: Secondary | ICD-10-CM | POA: Diagnosis not present

## 2020-11-28 DIAGNOSIS — M7731 Calcaneal spur, right foot: Secondary | ICD-10-CM | POA: Diagnosis not present

## 2020-12-07 ENCOUNTER — Encounter: Payer: Self-pay | Admitting: Oncology

## 2020-12-16 ENCOUNTER — Inpatient Hospital Stay (HOSPITAL_BASED_OUTPATIENT_CLINIC_OR_DEPARTMENT_OTHER): Payer: BC Managed Care – PPO | Admitting: Oncology

## 2020-12-16 ENCOUNTER — Inpatient Hospital Stay: Payer: BC Managed Care – PPO | Attending: Oncology

## 2020-12-16 ENCOUNTER — Encounter: Payer: Self-pay | Admitting: Oncology

## 2020-12-16 ENCOUNTER — Other Ambulatory Visit: Payer: Self-pay

## 2020-12-16 ENCOUNTER — Inpatient Hospital Stay: Payer: BC Managed Care – PPO

## 2020-12-16 VITALS — BP 121/81 | HR 80 | Temp 97.4°F | Resp 17 | Ht 63.0 in | Wt 190.0 lb

## 2020-12-16 DIAGNOSIS — D693 Immune thrombocytopenic purpura: Secondary | ICD-10-CM

## 2020-12-16 DIAGNOSIS — Z79899 Other long term (current) drug therapy: Secondary | ICD-10-CM | POA: Insufficient documentation

## 2020-12-16 LAB — CBC WITH DIFFERENTIAL/PLATELET
Abs Immature Granulocytes: 0.03 10*3/uL (ref 0.00–0.07)
Basophils Absolute: 0.1 10*3/uL (ref 0.0–0.1)
Basophils Relative: 1 %
Eosinophils Absolute: 0.3 10*3/uL (ref 0.0–0.5)
Eosinophils Relative: 3 %
HCT: 46.2 % — ABNORMAL HIGH (ref 36.0–46.0)
Hemoglobin: 15.8 g/dL — ABNORMAL HIGH (ref 12.0–15.0)
Immature Granulocytes: 0 %
Lymphocytes Relative: 27 %
Lymphs Abs: 2.7 10*3/uL (ref 0.7–4.0)
MCH: 32.8 pg (ref 26.0–34.0)
MCHC: 34.2 g/dL (ref 30.0–36.0)
MCV: 95.9 fL (ref 80.0–100.0)
Monocytes Absolute: 0.9 10*3/uL (ref 0.1–1.0)
Monocytes Relative: 9 %
Neutro Abs: 6 10*3/uL (ref 1.7–7.7)
Neutrophils Relative %: 60 %
Platelets: 40 10*3/uL — ABNORMAL LOW (ref 150–400)
RBC: 4.82 MIL/uL (ref 3.87–5.11)
RDW: 13.4 % (ref 11.5–15.5)
Smear Review: DECREASED
WBC: 10 10*3/uL (ref 4.0–10.5)
nRBC: 0 % (ref 0.0–0.2)

## 2020-12-16 MED ORDER — ROMIPLOSTIM INJECTION 500 MCG
3.0000 ug/kg | Freq: Once | SUBCUTANEOUS | Status: AC
Start: 2020-12-16 — End: 2020-12-16
  Administered 2020-12-16: 260 ug via SUBCUTANEOUS
  Filled 2020-12-16: qty 0.52

## 2020-12-16 NOTE — Patient Instructions (Signed)
Romiplostim injection What is this medicine? ROMIPLOSTIM (roe mi PLOE stim) helps your body make more platelets. This medicine is used to treat low platelets caused by chronic idiopathic thrombocytopenic purpura (ITP) or a bone marrow syndrome caused by radiation sickness. This medicine may be used for other purposes; ask your health care provider or pharmacist if you have questions. COMMON BRAND NAME(S): Nplate What should I tell my health care provider before I take this medicine? They need to know if you have any of these conditions:  blood clots  myelodysplastic syndrome  an unusual or allergic reaction to romiplostim, mannitol, other medicines, foods, dyes, or preservatives  pregnant or trying to get pregnant  breast-feeding How should I use this medicine? This medicine is injected under the skin. It is given by a health care provider in a hospital or clinic setting. A special MedGuide will be given to you before each treatment. Be sure to read this information carefully each time. Talk to your health care provider about the use of this medicine in children. While it may be prescribed for children as young as newborns for selected conditions, precautions do apply. Overdosage: If you think you have taken too much of this medicine contact a poison control center or emergency room at once. NOTE: This medicine is only for you. Do not share this medicine with others. What if I miss a dose? Keep appointments for follow-up doses. It is important not to miss your dose. Call your health care provider if you are unable to keep an appointment. What may interact with this medicine? Interactions are not expected. This list may not describe all possible interactions. Give your health care provider a list of all the medicines, herbs, non-prescription drugs, or dietary supplements you use. Also tell them if you smoke, drink alcohol, or use illegal drugs. Some items may interact with your  medicine. What should I watch for while using this medicine? Visit your health care provider for regular checks on your progress. You may need blood work done while you are taking this medicine. Your condition will be monitored carefully while you are receiving this medicine. It is important not to miss any appointments. What side effects may I notice from receiving this medicine? Side effects that you should report to your doctor or health care professional as soon as possible:  allergic reactions (skin rash, itching or hives; swelling of the face, lips, or tongue)  bleeding (bloody or black, tarry stools; red or dark brown urine; spitting up blood or brown material that looks like coffee grounds; red spots on the skin; unusual bruising or bleeding from the eyes, gums, or nose)  blood clot (chest pain; shortness of breath; pain, swelling, or warmth in the leg)  stroke (changes in vision; confusion; trouble speaking or understanding; severe headaches; sudden numbness or weakness of the face, arm or leg; trouble walking; dizziness; loss of balance or coordination) Side effects that usually do not require medical attention (report to your doctor or health care professional if they continue or are bothersome):  diarrhea  dizziness  headache  joint pain  muscle pain  stomach pain  trouble sleeping This list may not describe all possible side effects. Call your doctor for medical advice about side effects. You may report side effects to FDA at 1-800-FDA-1088. Where should I keep my medicine? This medicine is given in a hospital or clinic. It will not be stored at home. NOTE: This sheet is a summary. It may not cover all possible   information. If you have questions about this medicine, talk to your doctor, pharmacist, or health care provider.  2021 [employer]vier/Gold Standard (2019-08-17 10:28:13)  

## 2020-12-16 NOTE — Progress Notes (Signed)
[facility]  [phone] [phone]  ID: [name] OB: 11-May-1971  MR#: [id number]  EVO#:[id number]  Patient Care Team: McLean-Scocuzza, Nino Glow, MD as PCP - General (Internal Medicine) Lloyd Huger, MD as Consulting Physician (Oncology)   CHIEF COMPLAINT: ITP.  INTERVAL HISTORY: Patient returns to clinic today for repeat laboratory work, further evaluation, and continuation of Nplate. She last received Nplate on 2/99/3.  Platelet count was 33,000.  She continues to feel well and remains asymptomatic.  She was seen by podiatry (11/28/2020) for plantar fasciitis and received a cortisone injection with complete resolution of her pain.   REVIEW OF SYSTEMS:   Review of Systems  Constitutional: Negative.  Negative for chills, fever, malaise/fatigue and weight loss.  HENT: Negative for congestion, ear pain and tinnitus.   Eyes: Negative.  Negative for blurred vision and double vision.  Respiratory: Negative.  Negative for cough, sputum production and shortness of breath.   Cardiovascular: Negative.  Negative for chest pain, palpitations and leg swelling.  Gastrointestinal: Negative.  Negative for abdominal pain, constipation, diarrhea, nausea and vomiting.  Genitourinary: Negative for dysuria, frequency and urgency.  Musculoskeletal: Negative for back pain and falls.  Skin: Negative.  Negative for rash.  Neurological: Negative.  Negative for weakness and headaches.  Endo/Heme/Allergies: Negative.  Does not bruise/bleed easily.  Psychiatric/Behavioral: Negative.  Negative for depression. The patient is not nervous/anxious and does not have insomnia.     As per HPI. Otherwise, a complete review of systems is negative.  PAST MEDICAL HISTORY: Past Medical History:  Diagnosis Date  . Anxiety   . Chicken pox   . Depression   . Elevated testosterone level in female   . Emphysema of lung (HCC)    bronchitis  . Frequent headaches   . Hay  fever   . Hypertension   . Idiopathic thrombocytopenic purpura (ITP) (HCC)   . Positive ANA (antinuclear antibody)     PAST SURGICAL HISTORY: Past Surgical History:  Procedure Laterality Date  . HEMATOMA EVACUATION N/A 04/07/2020   Procedure: EVACUATION HEMATOMA  AND APPLICATION OF PRESSURE DRESSING;  Surgeon: Benjaman Kindler, MD;  Location: [facility] ORS;  Service: Gynecology;  Laterality: N/A;  . HYSTEROSCOPY WITH NOVASURE N/A 02/21/2018   Procedure: HYSTEROSCOPY WITH NOVASURE, POLYPECTOMY;  Surgeon: Benjaman Kindler, MD;  Location: [facility] ORS;  Service: Gynecology;  Laterality: N/A;  . TUBAL LIGATION  1997  . TUBAL LIGATION      FAMILY HISTORY: Family History  Problem Relation Age of Onset  . Hyperlipidemia Mother   . Heart disease Mother   . Stroke Mother   . Depression Mother   . Mental illness Mother   . Heart attack Mother 81  . Hyperlipidemia Father   . Depression Father   . Mental illness Father   . Diabetes Paternal Grandfather   . Cancer Cousin     ADVANCED DIRECTIVES (Y/N):  N  HEALTH MAINTENANCE: Social History   Tobacco Use  . Smoking status: Current Every Day Smoker    Packs/day: 1.50    Years: 28.00    Pack years: 42.00    Types: Cigarettes  . Smokeless tobacco: Never Used  Vaping Use  . Vaping Use: Never used  Substance Use Topics  . Alcohol use: Yes    Alcohol/week: 8.0 standard drinks    Types: 8 Standard drinks or equivalent per week    Comment: Beer (Can)   . Drug use: No    Comment: CBD oil  Colonoscopy:  PAP:  Bone density:  Lipid panel:  Allergies  Allergen Reactions  . Meloxicam Nausea Only    Current Outpatient Medications  Medication Sig Dispense Refill  . albuterol (PROAIR HFA) 108 (90 Base) MCG/ACT inhaler Inhale 2 puffs into the lungs every 6 (six) hours as needed for wheezing or shortness of breath. 18 g 1  . albuterol (PROVENTIL) (2.5 MG/3ML) 0.083% nebulizer solution Take 3 mLs (2.5 mg total) by nebulization every 6 (six)  hours as needed for wheezing or shortness of breath. 360 mL 11  . ALPRAZolam (XANAX) 0.25 MG tablet TAKE 1 TABLET BY MOUTH DAILY AS NEEDED FOR ANXIETY 30 tablet 5  . amLODipine (NORVASC) 10 MG tablet Take 1 tablet (10 mg total) by mouth daily. 90 tablet 3  . benzonatate (TESSALON) 200 MG capsule Take 1 capsule (200 mg total) by mouth 3 (three) times daily as needed for cough. 30 capsule 0  . carvedilol (COREG) 25 MG tablet Take 1 tablet (25 mg total) by mouth 2 (two) times daily with a meal. 180 tablet 3  . cetirizine (ZYRTEC) 10 MG tablet Take 1 tablet (10 mg total) by mouth daily. 90 tablet 3  . Cholecalciferol 1.25 MG (50000 UT) capsule Take 1 capsule (50,000 Units total) by mouth every 30 (thirty) days. 1x per month note this 13 capsule 0  . cyanocobalamin (,VITAMIN B-12,) 1000 MCG/ML injection Inject 1 mL (1,000 mcg total) into the muscle every 30 (thirty) days. 8 mL 1  . cyclobenzaprine (FLEXERIL) 10 MG tablet Take 1 tablet (10 mg total) by mouth 3 (three) times daily as needed for muscle spasms. 30 tablet 0  . diclofenac Sodium (VOLTAREN) 1 % GEL Apply 2-4 g topically 4 (four) times daily. 2 grams upper body qid prn and 4 gram lower body qid prn 150 g 11  . dicyclomine (BENTYL) 10 MG capsule Take 1 capsule (10 mg total) by mouth 3 (three) times daily as needed for spasms. Before food 270 capsule 3  . escitalopram (LEXAPRO) 20 MG tablet Take 1 tablet (20 mg total) by mouth daily. 90 tablet 3  . fluticasone (FLONASE) 50 MCG/ACT nasal spray Place 2 sprays into both nostrils daily. 16 g 11  . Fluticasone-Umeclidin-Vilant (TRELEGY ELLIPTA) 200-62.5-25 MCG/INH AEPB Inhale 1 puff into the lungs daily. 28 each 11  . mirabegron ER (MYRBETRIQ) 25 MG TB24 tablet Take 1 tablet (25 mg total) by mouth daily. 90 tablet 3  . naproxen (NAPROSYN) 500 MG tablet Take 1 tablet (500 mg total) by mouth 2 (two) times daily with a meal. 30 tablet 0  . omeprazole (PRILOSEC) 20 MG capsule Take 1 capsule (20 mg total) by  mouth daily. 90 capsule 3  . ondansetron (ZOFRAN) 4 MG tablet Take 1 tablet (4 mg total) by mouth every 8 (eight) hours as needed for nausea or vomiting. 40 tablet 2  . predniSONE (STERAPRED UNI-PAK 21 TAB) 10 MG (21) TBPK tablet 6 day taper; take as directed on package instruction 21 tablet 0  . spironolactone (ALDACTONE) 25 MG tablet Take 1 tablet (25 mg total) by mouth daily. In am 90 tablet 3  . Syringe/Needle, Disp, 25G X 1-1/2" 1 ML MISC 1 Device by Does not apply route as directed. 30 each 11  . traMADol (ULTRAM) 50 MG tablet Take 1-2 tablets (50-100 mg total) by mouth every 6 (six) hours as needed. 60 tablet 0  . traZODone (DESYREL) 50 MG tablet Take 0.5-1 tablets (25-50 mg total) by mouth at bedtime as  needed for sleep. 90 tablet 3   No current facility-administered medications for this visit.    OBJECTIVE: There were no vitals filed for this visit.   There is no height or weight on file to calculate BMI.    ECOG FS:0 - Asymptomatic  Physical Exam Constitutional:      Appearance: Normal appearance.  HENT:     Head: Normocephalic and atraumatic.  Eyes:     Pupils: Pupils are equal, round, and reactive to light.  Cardiovascular:     Rate and Rhythm: Normal rate and regular rhythm.     Heart sounds: Normal heart sounds. No murmur heard.   Pulmonary:     Effort: Pulmonary effort is normal.     Breath sounds: Normal breath sounds. No wheezing.  Abdominal:     General: Bowel sounds are normal. There is no distension.     Palpations: Abdomen is soft.     Tenderness: There is no abdominal tenderness.  Musculoskeletal:        General: Normal range of motion.     Cervical back: Normal range of motion.  Skin:    General: Skin is warm and dry.     Findings: No rash.  Neurological:     Mental Status: She is alert and oriented to person, place, and time.  Psychiatric:        Judgment: Judgment normal.       LAB RESULTS:  Lab Results  Component Value Date   NA 140  04/08/2020   K 4.6 04/08/2020   CL 104 04/08/2020   CO2 25 04/08/2020   GLUCOSE 131 (H) 04/08/2020   BUN 6 04/08/2020   CREATININE 0.77 04/08/2020   CALCIUM 8.5 (L) 04/08/2020   PROT 6.7 04/07/2020   ALBUMIN 3.8 04/07/2020   AST 52 (H) 04/07/2020   ALT 27 04/07/2020   ALKPHOS 58 04/07/2020   BILITOT 0.9 04/07/2020   GFRNONAA >60 04/08/2020   GFRAA >60 04/08/2020    Lab Results  Component Value Date   WBC 10.0 12/16/2020   NEUTROABS PENDING 12/16/2020   HGB 15.8 (H) 12/16/2020   HCT 46.2 (H) 12/16/2020   MCV 95.9 12/16/2020   PLT 40 (L) 12/16/2020     STUDIES: No results found.  ASSESSMENT: ITP  PLAN:    1.  ITP:  -Confirmed by bone marrow with normal FISH and cytogenetics on 07/22/2019. -Patient had positive ANA which is likely clinically insignificant. -Remainder of lab work was either normal or within normal range. -CT scan from 03/04/2017 did not reveal splenomegaly. -Initially started on prednisone with no appreciable improvement. -Rituxan was denied by insurance. -Goal is to maintain platelet count above 100. -Labs from today show a platelet count of 40. -Proceed with Nplate today.  2. Plantar Fascitis: -Received a cortisone injection on 11/28/2020 by Dr. Caryl Comes. -Symptoms have resolved.  Disposition: Nplate today. RTC in 3 weeks for repeat labs and Nplate.  RTC in 6 weeks for repeat labs, MD assessment and Nplate.  Greater than 50% was spent in counseling and coordination of care with this patient including but not limited to discussion of the relevant topics above (See A&P) including, but not limited to diagnosis and management of acute and chronic medical conditions.   Patient expressed understanding and was in agreement with this plan. She also understands that She can call clinic at any time with any questions, concerns, or complaints.    Jacquelin Hawking, NP   12/16/2020 1:01 PM

## 2020-12-29 ENCOUNTER — Telehealth: Payer: BC Managed Care – PPO | Admitting: Physician Assistant

## 2020-12-29 DIAGNOSIS — R110 Nausea: Secondary | ICD-10-CM

## 2020-12-29 MED ORDER — ONDANSETRON 4 MG PO TBDP: 4.0000 mg | ORAL_TABLET | Freq: Three times a day (TID) | ORAL | 0 refills | Status: DC | PRN

## 2020-12-29 NOTE — Progress Notes (Signed)
I have spent 5 minutes in review of e-visit questionnaire, review and updating patient chart, medical decision making and response to patient.   Traves Majchrzak Cody Deklen Popelka, PA-C    

## 2020-12-29 NOTE — Progress Notes (Signed)
We are sorry that you are not feeling well. Here is how we plan to help!  Based on what you have shared with me it looks like you have a nausea secondary to constipation and likely exacerbated by history of migraines.   I have prescribed a medication that will help alleviate your symptoms and allow you to stay hydrated:  Zofran 4 mg 1 tablet every 8 hours as needed for nausea and vomiting  I encourage you to increase hydration and the amount of fiber in your diet.  Start a daily probiotic (Align, Culturelle, Digestive Advantage, etc.). If no bowel movement within 24 hours, take 2 Tbs of Milk of Magnesia in a 4 oz glass of warmed prune juice every 2-3 days to help promote bowel movement. If no results within 24 hours, then repeat above regimen, adding a Dulcolax stool softener to regimen. If this does not promote a bowel movement, you need in-office evaluation.   HOME CARE: Drink clear liquids.  This is very important! Dehydration (the lack of fluid) can lead to a serious complication.  Start off with 1 tablespoon every 5 minutes for 8 hours. You may begin eating bland foods after 8 hours without vomiting.  Start with saltine crackers, white bread, rice, mashed potatoes, applesauce. After 48 hours on a bland diet, you may resume a normal diet. Try to go to sleep.  Sleep often empties the stomach and relieves the need to vomit.  GET HELP RIGHT AWAY IF:  Your symptoms do not improve or worsen within 2 days after treatment. You have a fever for over 3 days. You cannot keep down fluids after trying the medication.  MAKE SURE YOU:  Understand these instructions. Will watch your condition. Will get help right away if you are not doing well or get worse.   Thank you for choosing an e-visit. Your e-visit answers were reviewed by a board certified advanced clinical practitioner to complete your personal care plan. Depending upon the condition, your plan could have included both over the counter  or prescription medications. Please review your pharmacy choice. Be sure that the pharmacy you have chosen is open so that you can pick up your prescription now.  If there is a problem you may message your provider in [place] to have the prescription routed to another pharmacy. Your safety is important to [place]. If you have drug allergies check your prescription carefully.  For the next 24 hours, you can use MyChart to ask questions about today's visit, request a non-urgent call back, or ask for a work or school excuse from your e-visit provider. You will get an e-mail in the next two days asking about your experience. I hope that your e-visit has been valuable and will speed your recovery.

## 2021-01-03 ENCOUNTER — Other Ambulatory Visit: Payer: Self-pay

## 2021-01-03 ENCOUNTER — Other Ambulatory Visit: Payer: Self-pay | Admitting: Internal Medicine

## 2021-01-03 ENCOUNTER — Encounter: Payer: Self-pay | Admitting: Internal Medicine

## 2021-01-03 DIAGNOSIS — F32A Depression, unspecified: Secondary | ICD-10-CM

## 2021-01-03 DIAGNOSIS — F419 Anxiety disorder, unspecified: Secondary | ICD-10-CM

## 2021-01-03 DIAGNOSIS — I10 Essential (primary) hypertension: Secondary | ICD-10-CM

## 2021-01-03 MED ORDER — CARVEDILOL 25 MG PO TABS: 25.0000 mg | ORAL_TABLET | Freq: Two times a day (BID) | ORAL | 3 refills | Status: DC

## 2021-01-03 MED ORDER — AMLODIPINE BESYLATE 10 MG PO TABS: 10.0000 mg | ORAL_TABLET | Freq: Every day | ORAL | 3 refills | Status: DC

## 2021-01-06 ENCOUNTER — Inpatient Hospital Stay: Payer: BC Managed Care – PPO

## 2021-01-06 ENCOUNTER — Encounter: Payer: Self-pay | Admitting: Oncology

## 2021-01-06 ENCOUNTER — Other Ambulatory Visit: Payer: Self-pay

## 2021-01-06 ENCOUNTER — Encounter: Payer: Self-pay | Admitting: Hematology and Oncology

## 2021-01-06 DIAGNOSIS — D693 Immune thrombocytopenic purpura: Secondary | ICD-10-CM

## 2021-01-06 DIAGNOSIS — Z79899 Other long term (current) drug therapy: Secondary | ICD-10-CM | POA: Diagnosis not present

## 2021-01-06 LAB — CBC WITH DIFFERENTIAL/PLATELET
Abs Immature Granulocytes: 0.02 10*3/uL (ref 0.00–0.07)
Basophils Absolute: 0 10*3/uL (ref 0.0–0.1)
Basophils Relative: 0 %
Eosinophils Absolute: 0.2 10*3/uL (ref 0.0–0.5)
Eosinophils Relative: 2 %
HCT: 43.4 % (ref 36.0–46.0)
Hemoglobin: 14.8 g/dL (ref 12.0–15.0)
Immature Granulocytes: 0 %
Lymphocytes Relative: 20 %
Lymphs Abs: 2.2 10*3/uL (ref 0.7–4.0)
MCH: 33.1 pg (ref 26.0–34.0)
MCHC: 34.1 g/dL (ref 30.0–36.0)
MCV: 97.1 fL (ref 80.0–100.0)
Monocytes Absolute: 0.9 10*3/uL (ref 0.1–1.0)
Monocytes Relative: 8 %
Neutro Abs: 7.6 10*3/uL (ref 1.7–7.7)
Neutrophils Relative %: 70 %
Platelets: 43 10*3/uL — ABNORMAL LOW (ref 150–400)
RBC: 4.47 MIL/uL (ref 3.87–5.11)
RDW: 13.9 % (ref 11.5–15.5)
WBC: 10.9 10*3/uL — ABNORMAL HIGH (ref 4.0–10.5)
nRBC: 0 % (ref 0.0–0.2)

## 2021-01-06 MED ORDER — ROMIPLOSTIM INJECTION 500 MCG
260.0000 ug | Freq: Once | SUBCUTANEOUS | Status: AC
  Administered 2021-01-06: 260 ug via SUBCUTANEOUS
  Filled 2021-01-06: qty 0.52

## 2021-01-08 ENCOUNTER — Telehealth: Payer: BC Managed Care – PPO | Admitting: Emergency Medicine

## 2021-01-08 DIAGNOSIS — J069 Acute upper respiratory infection, unspecified: Secondary | ICD-10-CM

## 2021-01-08 MED ORDER — BENZONATATE 100 MG PO CAPS: 100.0000 mg | ORAL_CAPSULE | Freq: Two times a day (BID) | ORAL | 0 refills | Status: DC | PRN

## 2021-01-08 MED ORDER — FLUTICASONE PROPIONATE 50 MCG/ACT NA SUSP: 2.0000 | Freq: Every day | NASAL | 0 refills | Status: DC

## 2021-01-08 MED ORDER — ALBUTEROL SULFATE HFA 108 (90 BASE) MCG/ACT IN AERS: 2.0000 | INHALATION_SPRAY | Freq: Four times a day (QID) | RESPIRATORY_TRACT | 0 refills | Status: DC | PRN

## 2021-01-08 NOTE — Progress Notes (Signed)
E-Visit for Corona Virus Screening  Your current symptoms could be consistent with the coronavirus.  Many health care providers can now test patients at their office but not all are.  [name] has multiple testing sites. For information on our COVID testing locations and hours go to [email or web address]  We are enrolling you in our MyChart Home Monitoring for COVID19 . Daily you will receive a questionnaire within the MyChart website. Our COVID 19 response team will be monitoring your responses daily.  Testing Information: The COVID-19 Community Testing sites are testing BY APPOINTMENT ONLY.  You can schedule online at [email or web address]  If you do not have access to a smart phone or computer you may call [phone] for an appointment.   Additional testing sites in the Community:  For [facility] Testing sites in [place]  https://www.[facility]vs.com/minuteclinic/covid-19-testing  For [facility] testing sites in   https://covid19.ncdhhs.gov/about-covid-19/testing/find-my-testing-place/pop-testing-sites  For [facility] https://[place].gov[place]/human-services/health-department/coronavirus-covid-19-info/covid-19-testing  For [place] testing in [place] and [place] https://www[place].gov[place]/human-services/health-department/coronavirus-covid-19-info/covid-19-testing  For [facility] testing in [place]   https://[facility].care/covidtesting  For  more information about community testing call [phone]   Please quarantine yourself while awaiting your test results. Please stay home for a minimum of 10 days from the first day of illness with improving symptoms and you have had 24 hours of no fever (without the use of Tylenol (Acetaminophen) Motrin (Ibuprofen) or any fever reducing medication).  Also - Do not get tested prior to returning to work because once you have had a positive test the test can stay positive for  more than a month in some cases.   You should wear a mask or cloth face covering over your nose and mouth if you must be around other people or animals, including pets (even at home). Try to stay at least 6 feet away from other people. This will protect the people around you.  Please continue good preventive care measures, including:  frequent hand-washing, avoid touching your face, cover coughs/sneezes, stay out of crowds and keep a 6 foot distance from others.  COVID-19 is a respiratory illness with symptoms that are similar to the flu. Symptoms are typically mild to moderate, but there have been cases of severe illness and death due to the virus.   The following symptoms may appear 2-14 days after exposure: Fever Cough Shortness of breath or difficulty breathing Chills Repeated shaking with chills Muscle pain Headache Sore throat New loss of taste or smell Fatigue Congestion or runny nose Nausea or vomiting Diarrhea  Go to the nearest hospital ED for assessment if fever/cough/breathlessness are severe or illness seems like a threat to life.  It is vitally important that if you feel that you have an infection such as this virus or any other virus that you stay home and away from places where you may spread it to others.  You should avoid contact with people age 65 and older.   You can use medication such as prescription cough medication called Tessalon Perles 100 mg. You may take 1-2 capsules every 8 hours as needed for cough,  prescription inhaler called Albuterol MDI 90 mcg /actuation 2 puffs every 4 hours as needed for shortness of breath, wheezing, cough, and prescription for Fluticasone nasal spray 2 sprays in each nostril one time per day  You may also take acetaminophen (Tylenol) as needed for fever.  Reduce your risk of any infection by using the same precautions used for avoiding the common cold or flu:  Wash your hands often   with soap and warm water for at least 20 seconds.  If  soap and water are not readily available, use an alcohol-based hand sanitizer with at least 60% alcohol.  If coughing or sneezing, cover your mouth and nose by coughing or sneezing into the elbow areas of your shirt or coat, into a tissue or into your sleeve (not your hands). Avoid shaking hands with others and consider head nods or verbal greetings only. Avoid touching your eyes, nose, or mouth with unwashed hands.  Avoid close contact with people who are sick. Avoid places or events with large numbers of people in one location, like concerts or sporting events. Carefully consider travel plans you have or are making. If you are planning any travel outside or inside the [place], visit the CDC's Travelers' Health webpage for the latest health notices. If you have some symptoms but not all symptoms, continue to monitor at home and seek medical attention if your symptoms worsen. If you are having a medical emergency, call 911.  HOME CARE Only take medications as instructed by your medical team. Drink plenty of fluids and get plenty of rest. A steam or ultrasonic humidifier can help if you have congestion.   GET HELP RIGHT AWAY IF YOU HAVE EMERGENCY WARNING SIGNS** FOR COVID-19. If you or someone is showing any of these signs seek emergency medical care immediately. Call 911 or proceed to your closest emergency facility if: You develop worsening high fever. Trouble breathing Bluish lips or face Persistent pain or pressure in the chest New confusion Inability to wake or stay awake You cough up blood. Your symptoms become more severe  **This list is not all possible symptoms. Contact your medical provider for any symptoms that are sever or concerning to you.  MAKE SURE YOU  Understand these instructions. Will watch your condition. Will get help right away if you are not doing well or get worse.  Your e-visit answers were reviewed by a board certified advanced clinical practitioner to complete  your personal care plan.  Depending on the condition, your plan could have included both over the counter or prescription medications.  If there is a problem please reply once you have received a response from your provider.  Your safety is important to [place].  If you have drug allergies check your prescription carefully.    You can use MyChart to ask questions about today's visit, request a non-urgent call back, or ask for a work or school excuse for 24 hours related to this e-Visit. If it has been greater than 24 hours you will need to follow up with your provider, or enter a new e-Visit to address those concerns. You will get an e-mail in the next two days asking about your experience.  I hope that your e-visit has been valuable and will speed your recovery. Thank you for using e-visits.  Approximately 5 minutes was spent documenting and reviewing patient's chart.

## 2021-01-09 ENCOUNTER — Telehealth: Payer: BC Managed Care – PPO | Admitting: Physician Assistant

## 2021-01-09 DIAGNOSIS — B9689 Other specified bacterial agents as the cause of diseases classified elsewhere: Secondary | ICD-10-CM

## 2021-01-09 DIAGNOSIS — J329 Chronic sinusitis, unspecified: Secondary | ICD-10-CM | POA: Diagnosis not present

## 2021-01-09 MED ORDER — AMOXICILLIN-POT CLAVULANATE 875-125 MG PO TABS: 1.0000 | ORAL_TABLET | Freq: Two times a day (BID) | ORAL | 0 refills | Status: DC

## 2021-01-09 NOTE — Telephone Encounter (Signed)
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 She was prescribed antibiotics.   Informed the Patient that should her symptoms worsen or not resolve with completion of antibiotic, to call back in to our office. Patient verbalized understanding

## 2021-01-09 NOTE — Telephone Encounter (Signed)
Please advise 

## 2021-01-09 NOTE — Patient Instructions (Signed)
Carly [name], thank you for joining Mar Daring, PA-C for today's virtual visit.  While this provider is not your primary care provider (PCP), if your PCP is located in our provider database this encounter information will be shared with them immediately following your visit.  Consent: (Patient) Carly [name] provided verbal consent for this virtual visit at the beginning of the encounter.  Current Medications:  Current Outpatient Medications:    amoxicillin-clavulanate (AUGMENTIN) 875-125 MG tablet, Take 1 tablet by mouth 2 (two) times daily., Disp: 20 tablet, Rfl: 0   albuterol (VENTOLIN HFA) 108 (90 Base) MCG/ACT inhaler, Inhale 2 puffs into the lungs every 6 (six) hours as needed for wheezing or shortness of breath (cough)., Disp: 1 each, Rfl: 0   ALPRAZolam (XANAX) 0.25 MG tablet, TAKE 1 TABLET BY MOUTH DAILY AS NEEDED FOR ANXIETY, Disp: 30 tablet, Rfl: 5   amLODipine (NORVASC) 10 MG tablet, Take 1 tablet (10 mg total) by mouth daily., Disp: 90 tablet, Rfl: 3   benzonatate (TESSALON) 100 MG capsule, Take 1-2 capsules (100-200 mg total) by mouth 2 (two) times daily as needed for cough., Disp: 20 capsule, Rfl: 0   carvedilol (COREG) 25 MG tablet, Take 1 tablet (25 mg total) by mouth 2 (two) times daily with a meal., Disp: 180 tablet, Rfl: 3   cetirizine (ZYRTEC) 10 MG tablet, Take 1 tablet (10 mg total) by mouth daily., Disp: 90 tablet, Rfl: 3   Cholecalciferol 1.25 MG (50000 UT) capsule, Take 1 capsule (50,000 Units total) by mouth every 30 (thirty) days. 1x per month note this, Disp: 13 capsule, Rfl: 0   cyanocobalamin (,VITAMIN B-12,) 1000 MCG/ML injection, Inject 1 mL (1,000 mcg total) into the muscle every 30 (thirty) days., Disp: 8 mL, Rfl: 1   cyclobenzaprine (FLEXERIL) 10 MG tablet, Take 1 tablet (10 mg total) by mouth 3 (three) times daily as needed for muscle spasms., Disp: 30 tablet, Rfl: 0   diclofenac Sodium (VOLTAREN) 1 % GEL, Apply 2-4 g topically 4 (four) times  daily. 2 grams upper body qid prn and 4 gram lower body qid prn, Disp: 150 g, Rfl: 11   dicyclomine (BENTYL) 10 MG capsule, Take 1 capsule (10 mg total) by mouth 3 (three) times daily as needed for spasms. Before food, Disp: 270 capsule, Rfl: 3   escitalopram (LEXAPRO) 20 MG tablet, TAKE 1 TABLET BY MOUTH EVERY DAY, Disp: 90 tablet, Rfl: 3   fluticasone (FLONASE) 50 MCG/ACT nasal spray, Place 2 sprays into both nostrils daily., Disp: 16 g, Rfl: 0   Fluticasone-Umeclidin-Vilant (TRELEGY ELLIPTA) 200-62.5-25 MCG/INH AEPB, Inhale 1 puff into the lungs daily., Disp: 28 each, Rfl: 11   mirabegron ER (MYRBETRIQ) 25 MG TB24 tablet, Take 1 tablet (25 mg total) by mouth daily., Disp: 90 tablet, Rfl: 3   naproxen (NAPROSYN) 500 MG tablet, Take 1 tablet (500 mg total) by mouth 2 (two) times daily with a meal., Disp: 30 tablet, Rfl: 0   omeprazole (PRILOSEC) 20 MG capsule, Take 1 capsule (20 mg total) by mouth daily., Disp: 90 capsule, Rfl: 3   ondansetron (ZOFRAN ODT) 4 MG disintegrating tablet, Take 1 tablet (4 mg total) by mouth every 8 (eight) hours as needed for nausea or vomiting., Disp: 20 tablet, Rfl: 0   spironolactone (ALDACTONE) 25 MG tablet, Take 1 tablet (25 mg total) by mouth daily. In am, Disp: 90 tablet, Rfl: 3   Syringe/Needle, Disp, 25G X 1-1/2" 1 ML MISC, 1 Device by Does not apply route  as directed., Disp: 30 each, Rfl: 11   traMADol (ULTRAM) 50 MG tablet, Take 1-2 tablets (50-100 mg total) by mouth every 6 (six) hours as needed., Disp: 60 tablet, Rfl: 0   traZODone (DESYREL) 50 MG tablet, Take 0.5-1 tablets (25-50 mg total) by mouth at bedtime as needed for sleep., Disp: 90 tablet, Rfl: 3   Medications ordered in this encounter:  Meds ordered this encounter  Medications   amoxicillin-clavulanate (AUGMENTIN) 875-125 MG tablet    Sig: Take 1 tablet by mouth 2 (two) times daily.    Dispense:  20 tablet    Refill:  0    Order Specific Question:   Supervising Provider    Answer:   Sabra Heck,  BRIAN [[id number]]     *If you need refills on other medications prior to your next appointment, please contact your pharmacy*  Follow-Up: Call back or seek an in-person evaluation if the symptoms worsen or if the condition fails to improve as anticipated.   If you have been instructed to have an in-person evaluation today at a local Urgent Care facility, please use the link below. It will take you to a list of all of our available [facility], including address, phone number and hours of operation. Please do not delay care.  [facility]  If you or a family member do not have a primary care provider, use the link below to schedule a visit and establish care. When you choose a [facility] primary care physician or advanced practice provider, you gain a long-term partner in health. Find a Primary Care Provider  Learn more about [facility]'s in-office and virtual care options: [facility]   Sinusitis, Adult Sinusitis is inflammation of your sinuses. Sinuses are hollow spaces in the bones around your face. Your sinuses are located: Around your eyes. In the middle of your forehead. Behind your nose. In your cheekbones. Mucus normally drains out of your sinuses. When your nasal tissues become inflamed or swollen, mucus can become trapped or blocked. This allows bacteria, viruses, and fungi to grow, which leads to infection. Most infections of thesinuses are caused by a virus. Sinusitis can develop quickly. It can last for up to 4 weeks (acute) or for more than 12 weeks (chronic). Sinusitis often develops after a cold. What are the causes? This condition is caused by anything that creates swelling in the sinuses or stops mucus from draining. This includes: Allergies. Asthma. Infection from bacteria or viruses. Deformities or blockages in your nose or sinuses. Abnormal growths in the nose (nasal polyps). Pollutants, such as chemicals or irritants in the  air. Infection from fungi (rare). What increases the risk? You are more likely to develop this condition if you: Have a weak body defense system (immune system). Do a lot of swimming or diving. Overuse nasal sprays. Smoke. What are the signs or symptoms? The main symptoms of this condition are pain and a feeling of pressure around the affected sinuses. Other symptoms include: Stuffy nose or congestion. Thick drainage from your nose. Swelling and warmth over the affected sinuses. Headache. Upper toothache. A cough that may get worse at night. Extra mucus that collects in the throat or the back of the nose (postnasal drip). Decreased sense of smell and taste. Fatigue. A fever. Sore throat. Bad breath. How is this diagnosed? This condition is diagnosed based on: Your symptoms. Your medical history. A physical exam. Tests to find out if your condition is acute  or chronic. This may include: Checking your nose for nasal polyps. Viewing your sinuses using a device that has a light (endoscope). Testing for allergies or bacteria. Imaging tests, such as an MRI or CT scan. In rare cases, a bone biopsy may be done to rule out more serious types offungal sinus disease. How is this treated? Treatment for sinusitis depends on the cause and whether your condition is chronic or acute. If caused by a virus, your symptoms should go away on their own within 10 days. You may be given medicines to relieve symptoms. They include: Medicines that shrink swollen nasal passages (topical intranasal decongestants). Medicines that treat allergies (antihistamines). A spray that eases inflammation of the nostrils (topical intranasal corticosteroids). Rinses that help get rid of thick mucus in your nose (nasal saline washes). If caused by bacteria, your health care provider may recommend waiting to see if your symptoms improve. Most bacterial infections will get better without antibiotic medicine. You may be  given antibiotics if you have: A severe infection. A weak immune system. If caused by narrow nasal passages or nasal polyps, you may need to have surgery. Follow these instructions at home: Medicines Take, use, or apply over-the-counter and prescription medicines only as told by your health care provider. These may include nasal sprays. If you were prescribed an antibiotic medicine, take it as told by your health care provider. Do not stop taking the antibiotic even if you start to feel better. Hydrate and humidify  Drink enough fluid to keep your urine pale yellow. Staying hydrated will help to thin your mucus. Use a cool mist humidifier to keep the humidity level in your home above 50%. Inhale steam for 10-15 minutes, 3-4 times a day, or as told by your health care provider. You can do this in the bathroom while a hot shower is running. Limit your exposure to cool or dry air.  Rest Rest as much as possible. Sleep with your head raised (elevated). Make sure you get enough sleep each night. General instructions  Apply a warm, moist washcloth to your face 3-4 times a day or as told by your health care provider. This will help with discomfort. Wash your hands often with soap and water to reduce your exposure to germs. If soap and water are not available, use hand sanitizer. Do not smoke. Avoid being around people who are smoking (secondhand smoke). Keep all follow-up visits as told by your health care provider. This is important.  Contact a health care provider if: You have a fever. Your symptoms get worse. Your symptoms do not improve within 10 days. Get help right away if: You have a severe headache. You have persistent vomiting. You have severe pain or swelling around your face or eyes. You have vision problems. You develop confusion. Your neck is stiff. You have trouble breathing. Summary Sinusitis is soreness and inflammation of your sinuses. Sinuses are hollow spaces in the  bones around your face. This condition is caused by nasal tissues that become inflamed or swollen. The swelling traps or blocks the flow of mucus. This allows bacteria, viruses, and fungi to grow, which leads to infection. If you were prescribed an antibiotic medicine, take it as told by your health care provider. Do not stop taking the antibiotic even if you start to feel better. Keep all follow-up visits as told by your health care provider. This is important. This information is not intended to replace advice given to you by your health care provider. Make  sure you discuss any questions you have with your healthcare provider. Document Revised: 12/02/2017 Document Reviewed: 12/02/2017 Elsevier Patient Education  2022 [employer].

## 2021-01-09 NOTE — Progress Notes (Signed)
[name], Carly [name] are scheduled for a virtual visit with your provider today.    Just as we do with appointments in the office, we must obtain your consent to participate.  Your consent will be active for this visit and any virtual visit you may have with one of our providers in the next 365 days.    If you have a MyChart account, I can also send a copy of this consent to you electronically.  All virtual visits are billed to your insurance company just like a traditional visit in the office.  As this is a virtual visit, video technology does not allow for your provider to perform a traditional examination.  This may limit your provider's ability to fully assess your condition.  If your provider identifies any concerns that need to be evaluated in person or the need to arrange testing such as labs, EKG, etc, we will make arrangements to do so.    Although advances in technology are sophisticated, we cannot ensure that it will always work on either your end or our end.  If the connection with a video visit is poor, we may have to switch to a telephone visit.  With either a video or telephone visit, we are not always able to ensure that we have a secure connection.   I need to obtain your verbal consent now.   Are you willing to proceed with your visit today?   Carly [name] has provided verbal consent on 01/09/2021 for a virtual visit (video or telephone).   Carly Daring, PA-C 01/09/2021  1:51 PM  Virtual Visit Consent   Carly [name], you are scheduled for a virtual visit with a [facility] provider today.     Just as with appointments in the office, your consent must be obtained to participate.  Your consent will be active for this visit and any virtual visit you may have with one of our providers in the next 365 days.     If you have a MyChart account, a copy of this consent can be sent to you electronically.  All virtual visits are billed to your insurance company just like a traditional  visit in the office.    As this is a virtual visit, video technology does not allow for your provider to perform a traditional examination.  This may limit your provider's ability to fully assess your condition.  If your provider identifies any concerns that need to be evaluated in person or the need to arrange testing (such as labs, EKG, etc.), we will make arrangements to do so.     Although advances in technology are sophisticated, we cannot ensure that it will always work on either your end or our end.  If the connection with a video visit is poor, the visit may have to be switched to a telephone visit.  With either a video or telephone visit, we are not always able to ensure that we have a secure connection.     I need to obtain your verbal consent now.   Are you willing to proceed with your visit today?    Carly [name] has provided verbal consent on 01/09/2021 for a virtual visit (video or telephone).   Carly Daring, PA-C   Date: 01/09/2021 1:51 PM   Virtual Visit via Video Note   Carly [name], connected [email or web address] ([id number], [email or web address]) on 01/09/21 at  1:45 PM EDT by a video-enabled telemedicine application and verified that I am  speaking with the correct person using two identifiers.  Location: Patient: Virtual Visit Location Patient: Home Provider: Virtual Visit Location Provider: Home Office   I discussed the limitations of evaluation and management by telemedicine and the availability of in person appointments. The patient expressed understanding and agreed to proceed.    History of Present Illness: Carly [name] is a 50 y.o. who identifies as a female who was assigned female at birth, and is being seen today for headaches/Sinus symptoms. Patient did an evisit yesterday for same symptoms and was recommended to be covid tested At home covid test has been negative x 2 HPI: URI  The current episode started in the past 7 days. The problem has been gradually  worsening. There has been no fever. Associated symptoms include congestion, coughing, headaches, a plugged ear sensation (left), rhinorrhea, sinus pain, sneezing and a sore throat (scratchy from sinus drainage). Pertinent negatives include no ear pain, nausea or vomiting. She has tried decongestant, antihistamine, sleep, increased fluids, NSAIDs and acetaminophen for the symptoms. The treatment provided no relief.   Problems:  Patient Active Problem List   Diagnosis Date Noted   Gastroesophageal reflux disease without esophagitis 07/01/2020   Hyperlipidemia 07/01/2020   Posttraumatic vulvar hematoma 04/07/2020   Dizziness 03/29/2020   Fatty liver 12/24/2019   Angiolipoma of left kidney 12/24/2019   Chronic ITP (idiopathic thrombocytopenia) (HCC) 12/24/2019   Chronic ITP (idiopathic thrombocytopenic purpura) (HCC) 12/02/2019   Idiopathic thrombocytopenic purpura (ITP) (HCC) 11/10/2019   Verruca vulgaris 09/29/2019   Plantar fasciitis, bilateral 09/29/2019   Osteoarthritis of both knees 09/29/2019   Overactive bladder 07/14/2019   B12 deficiency 06/10/2019   Vitamin D deficiency 06/10/2019   Headache 01/30/2019   Elevated LFTs 01/30/2019   Abnormal uterine bleeding (AUB) 08/26/2018   Depression, major, single episode, moderate ([place]) 08/04/2018   Iron deficiency 09/02/2017   Menorrhagia with regular cycle 10/22/2016   Insomnia 08/15/2016   Nocturia 06/18/2016   Thrombocytopenia ([facility]) 02/12/2016   COPD exacerbation ([place]) 01/31/2016   Carpal tunnel syndrome 01/20/2016   Lateral epicondylitis 01/20/2016   Obesity 01/04/2016   Palpitations 12/15/2015   Myalgia 10/31/2015   Fatigue 10/31/2015   Mixed incontinence 03/09/2015   COPD (chronic obstructive pulmonary disease) ([facility]) 01/13/2015   Acute bronchitis 11/15/2014   Female hirsutism 09/22/2014   Elevated testosterone level in female 09/22/2014   Generalized anxiety disorder 08/23/2014   Tobacco use disorder 08/23/2014   Obesity  (BMI 30-39.9) 08/23/2014   HTN (hypertension) 08/23/2014    Allergies:  Allergies  Allergen Reactions   Meloxicam Nausea Only   Medications:  Current Outpatient Medications:    amoxicillin-clavulanate (AUGMENTIN) 875-125 MG tablet, Take 1 tablet by mouth 2 (two) times daily., Disp: 20 tablet, Rfl: 0   albuterol (VENTOLIN HFA) 108 (90 Base) MCG/ACT inhaler, Inhale 2 puffs into the lungs every 6 (six) hours as needed for wheezing or shortness of breath (cough)., Disp: 1 each, Rfl: 0   ALPRAZolam (XANAX) 0.25 MG tablet, TAKE 1 TABLET BY MOUTH DAILY AS NEEDED FOR ANXIETY, Disp: 30 tablet, Rfl: 5   amLODipine (NORVASC) 10 MG tablet, Take 1 tablet (10 mg total) by mouth daily., Disp: 90 tablet, Rfl: 3   benzonatate (TESSALON) 100 MG capsule, Take 1-2 capsules (100-200 mg total) by mouth 2 (two) times daily as needed for cough., Disp: 20 capsule, Rfl: 0   carvedilol (COREG) 25 MG tablet, Take 1 tablet (25 mg total) by mouth 2 (two) times daily with a meal., Disp: 180 tablet,  Rfl: 3   cetirizine (ZYRTEC) 10 MG tablet, Take 1 tablet (10 mg total) by mouth daily., Disp: 90 tablet, Rfl: 3   Cholecalciferol 1.25 MG (50000 UT) capsule, Take 1 capsule (50,000 Units total) by mouth every 30 (thirty) days. 1x per month note this, Disp: 13 capsule, Rfl: 0   cyanocobalamin (,VITAMIN B-12,) 1000 MCG/ML injection, Inject 1 mL (1,000 mcg total) into the muscle every 30 (thirty) days., Disp: 8 mL, Rfl: 1   cyclobenzaprine (FLEXERIL) 10 MG tablet, Take 1 tablet (10 mg total) by mouth 3 (three) times daily as needed for muscle spasms., Disp: 30 tablet, Rfl: 0   diclofenac Sodium (VOLTAREN) 1 % GEL, Apply 2-4 g topically 4 (four) times daily. 2 grams upper body qid prn and 4 gram lower body qid prn, Disp: 150 g, Rfl: 11   dicyclomine (BENTYL) 10 MG capsule, Take 1 capsule (10 mg total) by mouth 3 (three) times daily as needed for spasms. Before food, Disp: 270 capsule, Rfl: 3   escitalopram (LEXAPRO) 20 MG tablet, TAKE  1 TABLET BY MOUTH EVERY DAY, Disp: 90 tablet, Rfl: 3   fluticasone (FLONASE) 50 MCG/ACT nasal spray, Place 2 sprays into both nostrils daily., Disp: 16 g, Rfl: 0   Fluticasone-Umeclidin-Vilant (TRELEGY ELLIPTA) 200-62.5-25 MCG/INH AEPB, Inhale 1 puff into the lungs daily., Disp: 28 each, Rfl: 11   mirabegron ER (MYRBETRIQ) 25 MG TB24 tablet, Take 1 tablet (25 mg total) by mouth daily., Disp: 90 tablet, Rfl: 3   naproxen (NAPROSYN) 500 MG tablet, Take 1 tablet (500 mg total) by mouth 2 (two) times daily with a meal., Disp: 30 tablet, Rfl: 0   omeprazole (PRILOSEC) 20 MG capsule, Take 1 capsule (20 mg total) by mouth daily., Disp: 90 capsule, Rfl: 3   ondansetron (ZOFRAN ODT) 4 MG disintegrating tablet, Take 1 tablet (4 mg total) by mouth every 8 (eight) hours as needed for nausea or vomiting., Disp: 20 tablet, Rfl: 0   spironolactone (ALDACTONE) 25 MG tablet, Take 1 tablet (25 mg total) by mouth daily. In am, Disp: 90 tablet, Rfl: 3   Syringe/Needle, Disp, 25G X 1-1/2" 1 ML MISC, 1 Device by Does not apply route as directed., Disp: 30 each, Rfl: 11   traMADol (ULTRAM) 50 MG tablet, Take 1-2 tablets (50-100 mg total) by mouth every 6 (six) hours as needed., Disp: 60 tablet, Rfl: 0   traZODone (DESYREL) 50 MG tablet, Take 0.5-1 tablets (25-50 mg total) by mouth at bedtime as needed for sleep., Disp: 90 tablet, Rfl: 3  Observations/Objective: Patient is well-developed, well-nourished in no acute distress.  Resting comfortably at home.  Head is normocephalic, atraumatic.  No labored breathing.  Reports tenderness to left maxillary sinus and mostly left sided congestion Speech is clear and coherent with logical content.  Patient is alert and oriented at baseline.    Assessment and Plan: 1. Bacterial sinusitis - amoxicillin-clavulanate (AUGMENTIN) 875-125 MG tablet; Take 1 tablet by mouth 2 (two) times daily.  Dispense: 20 tablet; Refill: 0 - Worsening symptoms that have not responded to OTC  medications.  - Will give augmentin as above  - Continue allergy medications.  - Stay well hydrated and get plenty of rest.  - Call if no symptom improvement or if symptoms worsen.   Follow Up Instructions: I discussed the assessment and treatment plan with the patient. The patient was provided an opportunity to ask questions and all were answered. The patient agreed with the plan and demonstrated an understanding of the  instructions.  A copy of instructions were sent to the patient via MyChart.  The patient was advised to call back or seek an in-person evaluation if the symptoms worsen or if the condition fails to improve as anticipated.  Time:  I spent 13 minutes with the patient via telehealth technology discussing the above problems/concerns.    Carly Daring, PA-C

## 2021-01-12 ENCOUNTER — Telehealth (INDEPENDENT_AMBULATORY_CARE_PROVIDER_SITE_OTHER): Payer: BC Managed Care – PPO | Admitting: Internal Medicine

## 2021-01-12 DIAGNOSIS — D696 Thrombocytopenia, unspecified: Secondary | ICD-10-CM

## 2021-01-12 DIAGNOSIS — R519 Headache, unspecified: Secondary | ICD-10-CM

## 2021-01-12 NOTE — Telephone Encounter (Signed)
Is she still having headaches? If so platelets are <50 thousand and we need to do head imaging ct scan of brain to make sure no spontaneous bleeding in brain  Also what has her BP been?   Did she get seen at urgent care  If desired is she agreeable to my chart fee to order and work up?

## 2021-01-13 NOTE — Telephone Encounter (Signed)
Left message to return call 

## 2021-01-13 NOTE — Telephone Encounter (Signed)
Pt returned your cal

## 2021-01-13 NOTE — Telephone Encounter (Signed)
PT called to return the missed call 

## 2021-01-18 DIAGNOSIS — M7731 Calcaneal spur, right foot: Secondary | ICD-10-CM | POA: Diagnosis not present

## 2021-01-18 DIAGNOSIS — S99921A Unspecified injury of right foot, initial encounter: Secondary | ICD-10-CM | POA: Diagnosis not present

## 2021-01-19 NOTE — Telephone Encounter (Signed)
Patient has seen message on mychart with no response. Left message to return call. Mychart message resent

## 2021-01-20 NOTE — Telephone Encounter (Signed)
For your information  

## 2021-01-20 NOTE — Telephone Encounter (Signed)
Patient is agreeable to CT Scan

## 2021-01-23 DIAGNOSIS — S96911A Strain of unspecified muscle and tendon at ankle and foot level, right foot, initial encounter: Secondary | ICD-10-CM | POA: Diagnosis not present

## 2021-01-23 DIAGNOSIS — M65871 Other synovitis and tenosynovitis, right ankle and foot: Secondary | ICD-10-CM | POA: Diagnosis not present

## 2021-01-25 ENCOUNTER — Telehealth: Payer: Self-pay | Admitting: Internal Medicine

## 2021-01-25 NOTE — Telephone Encounter (Signed)
Lft pt vm to call ofc for a recent copy of ins. Please ask pt if she can upload ins card. thanks

## 2021-01-25 NOTE — Telephone Encounter (Signed)
A/P  H/a and low plts r/o ICH vs sinusitis   CT head   Dr. Olivia Mackie MCLean-Scocuzza  Time spent 5-10 agreeable to fee    Patient is agreeable to CT Scan       Travaglini, Carly [name] to Carly [name], CMA       7:49 AM Yes   Carly [name], CMA to Carly [name], Carly [name]       7:45 AM Good morning,   Does this mean you are agreeable to Dr Olivia Mackie McLean-Scocuzza ordering the CT scan?  Last read by Carly [name] at 7:49 AM on 01/20/2021.  Carly [name], CMA to Me      7:13 AM Note For your information     January 19, 2021  Carly [name], Carly [name] [name] to Carly [name], Carly [name]       12:46 PM Headache was due to sinus BP 116/78 and yes tomorrow up   Carly [name], CMA      11:50 AM Note Patient has seen message on mychart with no response. Left message to return call. Mychart message resent       Carly [name], CMA to Carly [name], Carly [name]       11:50 AM Good morning,   Dr Olivia Mackie McLean-Scocuzza was wanting to know the following:   Is she still having headaches? If so platelets are less than 50 thousand and we need to do head imaging ct scan of brain to make sure no spontaneous bleeding in brain.   Also what has her BP been?   Did she get seen at urgent care   If desired is she agreeable to my chart fee to order and work up?   Last read by Carly [name] at 7:49 AM on 01/20/2021

## 2021-01-25 NOTE — Addendum Note (Signed)
Addended by: Orland Mustard on: 01/25/2021 09:33 AM   Modules accepted: Orders, Level of Service

## 2021-01-26 ENCOUNTER — Encounter: Payer: Self-pay | Admitting: Internal Medicine

## 2021-01-26 ENCOUNTER — Telehealth: Payer: Self-pay | Admitting: Internal Medicine

## 2021-01-26 NOTE — Telephone Encounter (Signed)
Lft pt vm to call ofc to confirm the ins card was uploaded. thanks

## 2021-01-26 NOTE — Telephone Encounter (Signed)
Please advise 

## 2021-01-26 NOTE — Telephone Encounter (Signed)
Pt returned your call and I updated insurance and guarantor information and it should be correct now. Please contact with any other questions.

## 2021-01-30 ENCOUNTER — Other Ambulatory Visit: Payer: Self-pay

## 2021-01-30 ENCOUNTER — Inpatient Hospital Stay: Payer: BC Managed Care – PPO

## 2021-01-30 ENCOUNTER — Inpatient Hospital Stay (HOSPITAL_BASED_OUTPATIENT_CLINIC_OR_DEPARTMENT_OTHER): Payer: BC Managed Care – PPO | Admitting: Oncology

## 2021-01-30 ENCOUNTER — Encounter: Payer: Self-pay | Admitting: Oncology

## 2021-01-30 ENCOUNTER — Inpatient Hospital Stay: Payer: BC Managed Care – PPO | Attending: Oncology

## 2021-01-30 VITALS — BP 147/105 | HR 84 | Temp 98.5°F | Resp 16 | Wt 190.0 lb

## 2021-01-30 DIAGNOSIS — D693 Immune thrombocytopenic purpura: Secondary | ICD-10-CM

## 2021-01-30 DIAGNOSIS — Z79899 Other long term (current) drug therapy: Secondary | ICD-10-CM | POA: Insufficient documentation

## 2021-01-30 DIAGNOSIS — S99921D Unspecified injury of right foot, subsequent encounter: Secondary | ICD-10-CM

## 2021-01-30 LAB — CBC WITH DIFFERENTIAL/PLATELET
Abs Immature Granulocytes: 0.05 10*3/uL (ref 0.00–0.07)
Basophils Absolute: 0.1 10*3/uL (ref 0.0–0.1)
Basophils Relative: 0 %
Eosinophils Absolute: 0.2 10*3/uL (ref 0.0–0.5)
Eosinophils Relative: 1 %
HCT: 47.4 % — ABNORMAL HIGH (ref 36.0–46.0)
Hemoglobin: 16.1 g/dL — ABNORMAL HIGH (ref 12.0–15.0)
Immature Granulocytes: 0 %
Lymphocytes Relative: 18 %
Lymphs Abs: 2.4 10*3/uL (ref 0.7–4.0)
MCH: 33.3 pg (ref 26.0–34.0)
MCHC: 34 g/dL (ref 30.0–36.0)
MCV: 98.1 fL (ref 80.0–100.0)
Monocytes Absolute: 1.2 10*3/uL — ABNORMAL HIGH (ref 0.1–1.0)
Monocytes Relative: 9 %
Neutro Abs: 9.7 10*3/uL — ABNORMAL HIGH (ref 1.7–7.7)
Neutrophils Relative %: 72 %
Platelets: 23 10*3/uL — CL (ref 150–400)
RBC: 4.83 MIL/uL (ref 3.87–5.11)
RDW: 13.5 % (ref 11.5–15.5)
WBC: 13.5 10*3/uL — ABNORMAL HIGH (ref 4.0–10.5)
nRBC: 0 % (ref 0.0–0.2)

## 2021-01-30 MED ORDER — TRAMADOL HCL 50 MG PO TABS: 50.0000 mg | ORAL_TABLET | Freq: Four times a day (QID) | ORAL | 0 refills | Status: DC | PRN

## 2021-01-30 MED ORDER — ROMIPLOSTIM INJECTION 500 MCG
3.0000 ug/kg | Freq: Once | SUBCUTANEOUS | Status: AC
  Administered 2021-01-30: 260 ug via SUBCUTANEOUS
  Filled 2021-01-30: qty 0.52

## 2021-01-30 NOTE — Progress Notes (Signed)
Patient tolerated Nplate injection well today, no concerns voiced. Patient discharged, stable.

## 2021-01-30 NOTE — Progress Notes (Signed)
[facility]  [phone]) [phone] [phone]) [phone]  ID: Carly [name] OB: 03-20-1971  MR#: [id number]  NUU#:[id number]  Patient Care Team: McLean-Scocuzza, Nino Glow, MD as PCP - General (Internal Medicine) Lloyd Huger, MD as Consulting Physician (Oncology)   CHIEF COMPLAINT: ITP.  INTERVAL HISTORY: Patient returns to clinic today for repeat laboratory work, further evaluation, and continuation of Nplate. She last received Nplate on 3/47/42.  Platelet count was 43,000.  In the interim, she injured her right foot while exercising barefoot with a loop.  She is currently wearing a boot.  Reports fairly significant pain in her right foot.  She is taking 1 Tylenol every 2 hours.  She is worried she is taking too much.  She is followed by podiatry for plantar fasciitis and recently saw him for injury.  X-rays were negative.   REVIEW OF SYSTEMS:   Review of Systems  Constitutional: Negative.  Negative for chills, fever, malaise/fatigue and weight loss.  HENT:  Negative for congestion, ear pain and tinnitus.   Eyes: Negative.  Negative for blurred vision and double vision.  Respiratory: Negative.  Negative for cough, sputum production and shortness of breath.   Cardiovascular: Negative.  Negative for chest pain, palpitations and leg swelling.  Gastrointestinal: Negative.  Negative for abdominal pain, constipation, diarrhea, nausea and vomiting.  Genitourinary:  Negative for dysuria, frequency and urgency.  Musculoskeletal:  Negative for back pain and falls.       Right foot pain  Skin: Negative.  Negative for rash.  Neurological: Negative.  Negative for weakness and headaches.  Endo/Heme/Allergies: Negative.  Does not bruise/bleed easily.  Psychiatric/Behavioral: Negative.  Negative for depression. The patient is not nervous/anxious and does not have insomnia.    As per HPI. Otherwise, a complete review of systems is negative.  PAST MEDICAL HISTORY: Past  Medical History:  Diagnosis Date   Anxiety    Chicken pox    Depression    Elevated testosterone level in female    Emphysema of lung ([place])    bronchitis   Frequent headaches    Hay fever    Hypertension    Idiopathic thrombocytopenic purpura (ITP) (HCC)    Positive ANA (antinuclear antibody)     PAST SURGICAL HISTORY: Past Surgical History:  Procedure Laterality Date   HEMATOMA EVACUATION N/A 04/07/2020   Procedure: EVACUATION HEMATOMA  AND APPLICATION OF PRESSURE DRESSING;  Surgeon: Benjaman Kindler, MD;  Location: [facility] ORS;  Service: Gynecology;  Laterality: N/A;   HYSTEROSCOPY WITH NOVASURE N/A 02/21/2018   Procedure: HYSTEROSCOPY WITH NOVASURE, POLYPECTOMY;  Surgeon: Benjaman Kindler, MD;  Location: [facility] ORS;  Service: Gynecology;  Laterality: N/A;   TUBAL LIGATION  1997   TUBAL LIGATION      FAMILY HISTORY: Family History  Problem Relation Age of Onset   Hyperlipidemia Mother    Heart disease Mother    Stroke Mother    Depression Mother    Mental illness Mother    Heart attack Mother 68   Hyperlipidemia Father    Depression Father    Mental illness Father    Diabetes Paternal Grandfather    Cancer Cousin     ADVANCED DIRECTIVES (Y/N):  N  HEALTH MAINTENANCE: Social History   Tobacco Use   Smoking status: Every Day    Packs/day: 1.50    Years: 28.00    Pack years: 42.00    Types: Cigarettes   Smokeless tobacco: Never  Vaping Use   Vaping Use: Never used  Substance  Use Topics   Alcohol use: Yes    Alcohol/week: 8.0 standard drinks    Types: 8 Standard drinks or equivalent per week    Comment: Beer (Can)    Drug use: No    Comment: CBD oil     Colonoscopy:  PAP:  Bone density:  Lipid panel:  Allergies  Allergen Reactions   Meloxicam Nausea Only    Current Outpatient Medications  Medication Sig Dispense Refill   acetaminophen (TYLENOL) 500 MG tablet Take 500 mg by mouth every 6 (six) hours as needed.     albuterol (VENTOLIN HFA) 108 (90  Base) MCG/ACT inhaler Inhale 2 puffs into the lungs every 6 (six) hours as needed for wheezing or shortness of breath (cough). 1 each 0   ALPRAZolam (XANAX) 0.25 MG tablet TAKE 1 TABLET BY MOUTH DAILY AS NEEDED FOR ANXIETY 30 tablet 5   amLODipine (NORVASC) 10 MG tablet Take 1 tablet (10 mg total) by mouth daily. 90 tablet 3   amoxicillin-clavulanate (AUGMENTIN) 875-125 MG tablet Take 1 tablet by mouth 2 (two) times daily. 20 tablet 0   benzonatate (TESSALON) 100 MG capsule Take 1-2 capsules (100-200 mg total) by mouth 2 (two) times daily as needed for cough. 20 capsule 0   carvedilol (COREG) 25 MG tablet Take 1 tablet (25 mg total) by mouth 2 (two) times daily with a meal. 180 tablet 3   cetirizine (ZYRTEC) 10 MG tablet Take 1 tablet (10 mg total) by mouth daily. 90 tablet 3   Cholecalciferol 1.25 MG (50000 UT) capsule Take 1 capsule (50,000 Units total) by mouth every 30 (thirty) days. 1x per month note this 13 capsule 0   cyanocobalamin (,VITAMIN B-12,) 1000 MCG/ML injection Inject 1 mL (1,000 mcg total) into the muscle every 30 (thirty) days. 8 mL 1   cyclobenzaprine (FLEXERIL) 10 MG tablet Take 1 tablet (10 mg total) by mouth 3 (three) times daily as needed for muscle spasms. 30 tablet 0   diclofenac Sodium (VOLTAREN) 1 % GEL Apply 2-4 g topically 4 (four) times daily. 2 grams upper body qid prn and 4 gram lower body qid prn 150 g 11   dicyclomine (BENTYL) 10 MG capsule Take 1 capsule (10 mg total) by mouth 3 (three) times daily as needed for spasms. Before food 270 capsule 3   escitalopram (LEXAPRO) 20 MG tablet TAKE 1 TABLET BY MOUTH EVERY DAY 90 tablet 3   fluticasone (FLONASE) 50 MCG/ACT nasal spray Place 2 sprays into both nostrils daily. 16 g 0   Fluticasone-Umeclidin-Vilant (TRELEGY ELLIPTA) 200-62.5-25 MCG/INH AEPB Inhale 1 puff into the lungs daily. 28 each 11   ibuprofen (ADVIL) 600 MG tablet Take 600 mg by mouth every 6 (six) hours as needed.     mirabegron ER (MYRBETRIQ) 25 MG TB24  tablet Take 1 tablet (25 mg total) by mouth daily. 90 tablet 3   naproxen (NAPROSYN) 500 MG tablet Take 1 tablet (500 mg total) by mouth 2 (two) times daily with a meal. 30 tablet 0   omeprazole (PRILOSEC) 20 MG capsule Take 1 capsule (20 mg total) by mouth daily. 90 capsule 3   ondansetron (ZOFRAN ODT) 4 MG disintegrating tablet Take 1 tablet (4 mg total) by mouth every 8 (eight) hours as needed for nausea or vomiting. 20 tablet 0   spironolactone (ALDACTONE) 25 MG tablet Take 1 tablet (25 mg total) by mouth daily. In am 90 tablet 3   Syringe/Needle, Disp, 25G X 1-1/2" 1 ML MISC 1 Device by  Does not apply route as directed. 30 each 11   traZODone (DESYREL) 50 MG tablet Take 0.5-1 tablets (25-50 mg total) by mouth at bedtime as needed for sleep. 90 tablet 3   traMADol (ULTRAM) 50 MG tablet Take 1-2 tablets (50-100 mg total) by mouth every 6 (six) hours as needed. 60 tablet 0   No current facility-administered medications for this visit.   Facility-Administered Medications Ordered in Other Visits  Medication Dose Route Frequency Provider Last Rate Last Admin   romiPLOStim (NPLATE) injection 260 mcg  3 mcg/kg Subcutaneous Once Lloyd Huger, MD        OBJECTIVE: Vitals:   01/30/21 1403  BP: (!) 147/105  Pulse: 84  Resp: 16  Temp: 98.5 F (36.9 C)  SpO2: 99%     Body mass index is 33.66 kg/m.    ECOG FS:0 - Asymptomatic  Physical Exam Constitutional:      Appearance: Normal appearance.  HENT:     Head: Normocephalic and atraumatic.  Eyes:     Pupils: Pupils are equal, round, and reactive to light.  Cardiovascular:     Rate and Rhythm: Normal rate and regular rhythm.     Heart sounds: Normal heart sounds. No murmur heard. Pulmonary:     Effort: Pulmonary effort is normal.     Breath sounds: Normal breath sounds. No wheezing.  Abdominal:     General: Bowel sounds are normal. There is no distension.     Palpations: Abdomen is soft.     Tenderness: There is no abdominal  tenderness.  Musculoskeletal:        General: Normal range of motion.     Cervical back: Normal range of motion.  Skin:    General: Skin is warm and dry.     Findings: No rash.  Neurological:     Mental Status: She is alert and oriented to person, place, and time.  Psychiatric:        Judgment: Judgment normal.      LAB RESULTS:  Lab Results  Component Value Date   NA 140 04/08/2020   K 4.6 04/08/2020   CL 104 04/08/2020   CO2 25 04/08/2020   GLUCOSE 131 (H) 04/08/2020   BUN 6 04/08/2020   CREATININE 0.77 04/08/2020   CALCIUM 8.5 (L) 04/08/2020   PROT 6.7 04/07/2020   ALBUMIN 3.8 04/07/2020   AST 52 (H) 04/07/2020   ALT 27 04/07/2020   ALKPHOS 58 04/07/2020   BILITOT 0.9 04/07/2020   GFRNONAA >60 04/08/2020   GFRAA >60 04/08/2020    Lab Results  Component Value Date   WBC 13.5 (H) 01/30/2021   NEUTROABS 9.7 (H) 01/30/2021   HGB 16.1 (H) 01/30/2021   HCT 47.4 (H) 01/30/2021   MCV 98.1 01/30/2021   PLT 23 (LL) 01/30/2021     STUDIES: No results found.  ASSESSMENT: ITP  PLAN:    1.  ITP:  -Confirmed by bone marrow with normal FISH and cytogenetics on 07/22/2019. -Patient had positive ANA which is likely clinically insignificant. -Remainder of lab work was either normal or within normal range. -CT scan from 03/04/2017 did not reveal splenomegaly. -Initially started on prednisone with no appreciable improvement. -Rituxan was denied by insurance. -Goal is to maintain platelet count above 100. -Labs from today show a platelet count of 23. -Proceed with Nplate today. -Given steady decline of her platelet counts, will touch base with Dr. Grayland Ormond regarding switching treatment to Rituxan if approved by insurance.  Patient has applied for  a grant that was approved after she had started Nplate.   2.  Right foot injury: -Followed by podiatry Dr. Caryl Comes -Secondary to exercising. -Currently taking 1 500 mg Tylenol every 2 hours. -Work-up included x-ray which was  normal-thought to be strain of right foot or tenosynovitis.  -Patient would like to try tramadol 1 to 2 tablets every 6 hours for pain.   Disposition: Nplate today. RTC in 3 weeks for repeat labs and Nplate.  RTC in 6 weeks for repeat labs, MD assessment and Nplate.  Greater than 50% was spent in counseling and coordination of care with this patient including but not limited to discussion of the relevant topics above (See A&P) including, but not limited to diagnosis and management of acute and chronic medical conditions.   Patient expressed understanding and was in agreement with this plan. She also understands that She can call clinic at any time with any questions, concerns, or complaints.    Jacquelin Hawking, NP   01/30/2021 2:54 PM

## 2021-01-30 NOTE — Progress Notes (Signed)
Patient here for oncology follow-up appointment, expresses concerns of tylenol intake and foot pain

## 2021-02-09 ENCOUNTER — Ambulatory Visit
Admission: RE | Admit: 2021-02-09 | Discharge: 2021-02-09 | Disposition: A | Discharge status: Home / Self Care | Payer: BC Managed Care – PPO | Source: Ambulatory Visit | Attending: Internal Medicine | Admitting: Internal Medicine

## 2021-02-09 ENCOUNTER — Other Ambulatory Visit: Payer: Self-pay

## 2021-02-09 DIAGNOSIS — S0990XA Unspecified injury of head, initial encounter: Secondary | ICD-10-CM | POA: Diagnosis not present

## 2021-02-09 DIAGNOSIS — R519 Headache, unspecified: Secondary | ICD-10-CM | POA: Diagnosis not present

## 2021-02-09 DIAGNOSIS — D696 Thrombocytopenia, unspecified: Secondary | ICD-10-CM | POA: Diagnosis not present

## 2021-02-14 ENCOUNTER — Ambulatory Visit: Payer: BC Managed Care – PPO

## 2021-02-14 ENCOUNTER — Telehealth: Payer: BC Managed Care – PPO | Admitting: Physician Assistant

## 2021-02-14 ENCOUNTER — Other Ambulatory Visit: Payer: BC Managed Care – PPO

## 2021-02-14 DIAGNOSIS — S96911A Strain of unspecified muscle and tendon at ankle and foot level, right foot, initial encounter: Secondary | ICD-10-CM | POA: Diagnosis not present

## 2021-02-14 DIAGNOSIS — M65871 Other synovitis and tenosynovitis, right ankle and foot: Secondary | ICD-10-CM | POA: Diagnosis not present

## 2021-02-14 DIAGNOSIS — M549 Dorsalgia, unspecified: Secondary | ICD-10-CM | POA: Diagnosis not present

## 2021-02-14 MED ORDER — CYCLOBENZAPRINE HCL 10 MG PO TABS
10.0000 mg | ORAL_TABLET | Freq: Three times a day (TID) | ORAL | 0 refills | Status: DC | PRN
Start: 2021-02-14 — End: 2021-07-13

## 2021-02-14 MED ORDER — NAPROXEN 500 MG PO TABS
500.0000 mg | ORAL_TABLET | Freq: Two times a day (BID) | ORAL | 0 refills | Status: DC
Start: 2021-02-14 — End: 2021-07-13

## 2021-02-14 NOTE — Progress Notes (Signed)
I have spent 5 minutes in review of e-visit questionnaire, review and updating patient chart, medical decision making and response to patient.   Mcdaniel Ohms Cody Zaylei Mullane, PA-C    

## 2021-02-14 NOTE — Progress Notes (Signed)

## 2021-02-21 ENCOUNTER — Other Ambulatory Visit: Payer: Self-pay

## 2021-02-21 ENCOUNTER — Inpatient Hospital Stay: Payer: BC Managed Care – PPO | Attending: Oncology

## 2021-02-21 ENCOUNTER — Inpatient Hospital Stay: Payer: BC Managed Care – PPO

## 2021-02-21 VITALS — Wt 185.0 lb

## 2021-02-21 DIAGNOSIS — D693 Immune thrombocytopenic purpura: Secondary | ICD-10-CM

## 2021-02-21 DIAGNOSIS — Z79899 Other long term (current) drug therapy: Secondary | ICD-10-CM | POA: Insufficient documentation

## 2021-02-21 LAB — CBC WITH DIFFERENTIAL/PLATELET
Abs Immature Granulocytes: 0.02 10*3/uL (ref 0.00–0.07)
Basophils Absolute: 0.1 10*3/uL (ref 0.0–0.1)
Basophils Relative: 1 %
Eosinophils Absolute: 0.3 10*3/uL (ref 0.0–0.5)
Eosinophils Relative: 4 %
HCT: 45.7 % (ref 36.0–46.0)
Hemoglobin: 15.6 g/dL — ABNORMAL HIGH (ref 12.0–15.0)
Immature Granulocytes: 0 %
Lymphocytes Relative: 32 %
Lymphs Abs: 2.5 10*3/uL (ref 0.7–4.0)
MCH: 34.2 pg — ABNORMAL HIGH (ref 26.0–34.0)
MCHC: 34.1 g/dL (ref 30.0–36.0)
MCV: 100.2 fL — ABNORMAL HIGH (ref 80.0–100.0)
Monocytes Absolute: 0.9 10*3/uL (ref 0.1–1.0)
Monocytes Relative: 11 %
Neutro Abs: 4.2 10*3/uL (ref 1.7–7.7)
Neutrophils Relative %: 52 %
Platelets: 40 10*3/uL — ABNORMAL LOW (ref 150–400)
RBC: 4.56 MIL/uL (ref 3.87–5.11)
RDW: 13.2 % (ref 11.5–15.5)
WBC: 8 10*3/uL (ref 4.0–10.5)
nRBC: 0 % (ref 0.0–0.2)

## 2021-02-21 MED ORDER — ROMIPLOSTIM 250 MCG ~~LOC~~ SOLR
3.0000 ug/kg | Freq: Once | SUBCUTANEOUS | Status: AC
  Administered 2021-02-21: 250 ug via SUBCUTANEOUS
  Filled 2021-02-21: qty 0.5

## 2021-02-28 DIAGNOSIS — M79671 Pain in right foot: Secondary | ICD-10-CM | POA: Diagnosis not present

## 2021-02-28 DIAGNOSIS — M65871 Other synovitis and tenosynovitis, right ankle and foot: Secondary | ICD-10-CM | POA: Diagnosis not present

## 2021-03-06 ENCOUNTER — Ambulatory Visit: Payer: BC Managed Care – PPO | Admitting: Oncology

## 2021-03-06 ENCOUNTER — Other Ambulatory Visit: Payer: BC Managed Care – PPO

## 2021-03-06 ENCOUNTER — Ambulatory Visit: Payer: BC Managed Care – PPO

## 2021-03-14 ENCOUNTER — Inpatient Hospital Stay: Payer: BC Managed Care – PPO

## 2021-03-14 ENCOUNTER — Inpatient Hospital Stay: Payer: BC Managed Care – PPO | Admitting: Oncology

## 2021-03-15 ENCOUNTER — Other Ambulatory Visit: Payer: Self-pay | Admitting: Internal Medicine

## 2021-03-15 DIAGNOSIS — J441 Chronic obstructive pulmonary disease with (acute) exacerbation: Secondary | ICD-10-CM

## 2021-03-15 DIAGNOSIS — J45901 Unspecified asthma with (acute) exacerbation: Secondary | ICD-10-CM

## 2021-03-21 ENCOUNTER — Inpatient Hospital Stay: Payer: BC Managed Care – PPO

## 2021-03-21 ENCOUNTER — Encounter: Payer: Self-pay | Admitting: Oncology

## 2021-03-21 ENCOUNTER — Inpatient Hospital Stay: Payer: BC Managed Care – PPO | Attending: Oncology | Admitting: Oncology

## 2021-03-21 ENCOUNTER — Other Ambulatory Visit: Payer: Self-pay

## 2021-03-21 VITALS — BP 114/70 | HR 76 | Temp 98.4°F | Wt 197.0 lb

## 2021-03-21 DIAGNOSIS — D693 Immune thrombocytopenic purpura: Secondary | ICD-10-CM | POA: Diagnosis not present

## 2021-03-21 LAB — CBC WITH DIFFERENTIAL/PLATELET
Abs Immature Granulocytes: 0.02 10*3/uL (ref 0.00–0.07)
Basophils Absolute: 0.1 10*3/uL (ref 0.0–0.1)
Basophils Relative: 1 %
Eosinophils Absolute: 0.2 10*3/uL (ref 0.0–0.5)
Eosinophils Relative: 2 %
HCT: 45.1 % (ref 36.0–46.0)
Hemoglobin: 15.6 g/dL — ABNORMAL HIGH (ref 12.0–15.0)
Immature Granulocytes: 0 %
Lymphocytes Relative: 25 %
Lymphs Abs: 2.2 10*3/uL (ref 0.7–4.0)
MCH: 34.6 pg — ABNORMAL HIGH (ref 26.0–34.0)
MCHC: 34.6 g/dL (ref 30.0–36.0)
MCV: 100 fL (ref 80.0–100.0)
Monocytes Absolute: 0.8 10*3/uL (ref 0.1–1.0)
Monocytes Relative: 9 %
Neutro Abs: 5.5 10*3/uL (ref 1.7–7.7)
Neutrophils Relative %: 63 %
Platelets: 34 10*3/uL — ABNORMAL LOW (ref 150–400)
RBC: 4.51 MIL/uL (ref 3.87–5.11)
RDW: 12.7 % (ref 11.5–15.5)
WBC: 8.8 10*3/uL (ref 4.0–10.5)
nRBC: 0 % (ref 0.0–0.2)

## 2021-03-21 MED ORDER — ROMIPLOSTIM INJECTION 500 MCG: 3.0000 ug/kg | Freq: Once | SUBCUTANEOUS | Status: DC

## 2021-03-21 MED ORDER — ROMIPLOSTIM INJECTION 500 MCG
3.0000 ug/kg | Freq: Once | SUBCUTANEOUS | Status: AC
  Administered 2021-03-21: 270 ug via SUBCUTANEOUS
  Filled 2021-03-21: qty 0.54

## 2021-03-21 NOTE — Progress Notes (Signed)
Denies any visible bleeding. Bruising is normal. Chronic headaches. Had a CT sca which was normal.

## 2021-03-21 NOTE — Progress Notes (Signed)
[facility]  Telephone:(336) [phone] Fax:(336) [phone]  ID: Carly [name] OB: 1970-12-09  MR#: [id number]  [id number]  Patient Care Team: McLean-Scocuzza, Nino Glow, MD as PCP - General (Internal Medicine) Lloyd Huger, MD as Consulting Physician (Oncology)   CHIEF COMPLAINT: ITP.  INTERVAL HISTORY: Patient returns to clinic today for repeat laboratory work, further evaluation, and continuation of Nplate.  She receives Nplate approximately every 3 weeks.  She last received Nplate on 579FGE.  Platelet count has been fluctuating between 20-65,000 over the past 6 to 8 months.  Since her last visit, she was evaluated by family medicine for acute back pain.  Symptoms have improved.  Today, states she is overall feeling well.  Does report 4 to 5 weeks of decline in her appetite and intermittent nausea.  She has been taking Zofran as needed.  She will follow-up with PCP in the next couple weeks.  Her right foot pain has improved and she is no longer wearing a boot.  Reported low back pain several weeks ago which has improved since discontinuing the boot.   REVIEW OF SYSTEMS:   Review of Systems  Constitutional: Negative.  Negative for chills, fever, malaise/fatigue and weight loss.  HENT:  Negative for congestion, ear pain and tinnitus.   Eyes: Negative.  Negative for blurred vision and double vision.  Respiratory: Negative.  Negative for cough, sputum production and shortness of breath.   Cardiovascular: Negative.  Negative for chest pain, palpitations and leg swelling.  Gastrointestinal: Negative.  Negative for abdominal pain, constipation, diarrhea, nausea and vomiting.  Genitourinary:  Negative for dysuria, frequency and urgency.  Musculoskeletal:  Negative for back pain and falls.  Skin: Negative.  Negative for rash.  Neurological: Negative.  Negative for weakness and headaches.  Endo/Heme/Allergies: Negative.  Does not bruise/bleed easily.   Psychiatric/Behavioral: Negative.  Negative for depression. The patient is not nervous/anxious and does not have insomnia.    As per HPI. Otherwise, a complete review of systems is negative.  PAST MEDICAL HISTORY: Past Medical History:  Diagnosis Date   Anxiety    Chicken pox    Depression    Elevated testosterone level in female    Emphysema of lung ([place])    bronchitis   Frequent headaches    Hay fever    Hypertension    Idiopathic thrombocytopenic purpura (ITP) ([facility])    Positive ANA (antinuclear antibody)     PAST SURGICAL HISTORY: Past Surgical History:  Procedure Laterality Date   HEMATOMA EVACUATION N/A 04/07/2020   Procedure: EVACUATION HEMATOMA  AND APPLICATION OF PRESSURE DRESSING;  Surgeon: Benjaman Kindler, MD;  Location: [facility] ORS;  Service: Gynecology;  Laterality: N/A;   HYSTEROSCOPY WITH NOVASURE N/A 02/21/2018   Procedure: HYSTEROSCOPY WITH NOVASURE, POLYPECTOMY;  Surgeon: Benjaman Kindler, MD;  Location: [facility] ORS;  Service: Gynecology;  Laterality: N/A;   TUBAL LIGATION  1997   TUBAL LIGATION      FAMILY HISTORY: Family History  Problem Relation Age of Onset   Hyperlipidemia Mother    Heart disease Mother    Stroke Mother    Depression Mother    Mental illness Mother    Heart attack Mother 22   Hyperlipidemia Father    Depression Father    Mental illness Father    Diabetes Paternal Grandfather    Cancer Cousin     ADVANCED DIRECTIVES (Y/N):  N  HEALTH MAINTENANCE: Social History   Tobacco Use   Smoking status: Every Day    Packs/day:  1.50    Years: 28.00    Pack years: 42.00    Types: Cigarettes   Smokeless tobacco: Never  Vaping Use   Vaping Use: Never used  Substance Use Topics   Alcohol use: Yes    Alcohol/week: 8.0 standard drinks    Types: 8 Standard drinks or equivalent per week    Comment: Beer (Can)    Drug use: No    Comment: CBD oil     Colonoscopy:  PAP:  Bone density:  Lipid panel:  Allergies  Allergen Reactions    Meloxicam Nausea Only    Current Outpatient Medications  Medication Sig Dispense Refill   acetaminophen (TYLENOL) 500 MG tablet Take 500 mg by mouth every 6 (six) hours as needed.     albuterol (VENTOLIN HFA) 108 (90 Base) MCG/ACT inhaler Inhale 2 puffs into the lungs every 6 (six) hours as needed for wheezing or shortness of breath (cough). 1 each 0   ALPRAZolam (XANAX) 0.25 MG tablet TAKE 1 TABLET BY MOUTH DAILY AS NEEDED FOR ANXIETY 30 tablet 5   amLODipine (NORVASC) 10 MG tablet Take 1 tablet (10 mg total) by mouth daily. 90 tablet 3   carvedilol (COREG) 25 MG tablet Take 1 tablet (25 mg total) by mouth 2 (two) times daily with a meal. 180 tablet 3   cetirizine (ZYRTEC) 10 MG tablet Take 1 tablet (10 mg total) by mouth daily. 90 tablet 3   Cholecalciferol 1.25 MG (50000 UT) capsule Take 1 capsule (50,000 Units total) by mouth every 30 (thirty) days. 1x per month note this 13 capsule 0   cyanocobalamin (,VITAMIN B-12,) 1000 MCG/ML injection Inject 1 mL (1,000 mcg total) into the muscle every 30 (thirty) days. 8 mL 1   cyclobenzaprine (FLEXERIL) 10 MG tablet Take 1 tablet (10 mg total) by mouth 3 (three) times daily as needed for muscle spasms. 15 tablet 0   diclofenac Sodium (VOLTAREN) 1 % GEL Apply 2-4 g topically 4 (four) times daily. 2 grams upper body qid prn and 4 gram lower body qid prn 150 g 11   dicyclomine (BENTYL) 10 MG capsule Take 1 capsule (10 mg total) by mouth 3 (three) times daily as needed for spasms. Before food 270 capsule 3   escitalopram (LEXAPRO) 20 MG tablet TAKE 1 TABLET BY MOUTH EVERY DAY 90 tablet 3   fluticasone (FLONASE) 50 MCG/ACT nasal spray Place 2 sprays into both nostrils daily. 16 g 0   Fluticasone-Umeclidin-Vilant (TRELEGY ELLIPTA) 200-62.5-25 MCG/INH AEPB Inhale 1 puff into the lungs daily. 28 each 11   ibuprofen (ADVIL) 600 MG tablet Take 600 mg by mouth every 6 (six) hours as needed.     mirabegron ER (MYRBETRIQ) 25 MG TB24 tablet Take 1 tablet (25 mg  total) by mouth daily. 90 tablet 3   naproxen (NAPROSYN) 500 MG tablet Take 1 tablet (500 mg total) by mouth 2 (two) times daily with a meal. 20 tablet 0   omeprazole (PRILOSEC) 20 MG capsule Take 1 capsule (20 mg total) by mouth daily. 90 capsule 3   ondansetron (ZOFRAN ODT) 4 MG disintegrating tablet Take 1 tablet (4 mg total) by mouth every 8 (eight) hours as needed for nausea or vomiting. 20 tablet 0   spironolactone (ALDACTONE) 25 MG tablet Take 1 tablet (25 mg total) by mouth daily. In am 90 tablet 3   Syringe/Needle, Disp, 25G X 1-1/2" 1 ML MISC 1 Device by Does not apply route as directed. 30 each 11   traMADol (  ULTRAM) 50 MG tablet Take 1-2 tablets (50-100 mg total) by mouth every 6 (six) hours as needed. 60 tablet 0   traZODone (DESYREL) 50 MG tablet Take 0.5-1 tablets (25-50 mg total) by mouth at bedtime as needed for sleep. 90 tablet 3   No current facility-administered medications for this visit.   Facility-Administered Medications Ordered in Other Visits  Medication Dose Route Frequency Provider Last Rate Last Admin   romiPLOStim (NPLATE) injection 270 mcg  3 mcg/kg Subcutaneous Once Lloyd Huger, MD        OBJECTIVE: Vitals:   03/21/21 1321  BP: 114/70  Pulse: 76  Temp: 98.4 F (36.9 C)  SpO2: 97%     Body mass index is 34.9 kg/m.    ECOG FS:0 - Asymptomatic  Physical Exam Constitutional:      Appearance: Normal appearance. She is obese.  HENT:     Head: Normocephalic and atraumatic.  Eyes:     Pupils: Pupils are equal, round, and reactive to light.  Cardiovascular:     Rate and Rhythm: Normal rate and regular rhythm.     Heart sounds: Normal heart sounds. No murmur heard. Pulmonary:     Effort: Pulmonary effort is normal.     Breath sounds: Normal breath sounds. No wheezing.  Abdominal:     General: Bowel sounds are normal. There is no distension.     Palpations: Abdomen is soft.     Tenderness: There is no abdominal tenderness.  Musculoskeletal:         General: Normal range of motion.     Cervical back: Normal range of motion.  Skin:    General: Skin is warm and dry.     Findings: No rash.  Neurological:     Mental Status: She is alert and oriented to person, place, and time.  Psychiatric:        Judgment: Judgment normal.      LAB RESULTS:  Lab Results  Component Value Date   NA 140 04/08/2020   K 4.6 04/08/2020   CL 104 04/08/2020   CO2 25 04/08/2020   GLUCOSE 131 (H) 04/08/2020   BUN 6 04/08/2020   CREATININE 0.77 04/08/2020   CALCIUM 8.5 (L) 04/08/2020   PROT 6.7 04/07/2020   ALBUMIN 3.8 04/07/2020   AST 52 (H) 04/07/2020   ALT 27 04/07/2020   ALKPHOS 58 04/07/2020   BILITOT 0.9 04/07/2020   GFRNONAA >60 04/08/2020   GFRAA >60 04/08/2020    Lab Results  Component Value Date   WBC 8.8 03/21/2021   NEUTROABS 5.5 03/21/2021   HGB 15.6 (H) 03/21/2021   HCT 45.1 03/21/2021   MCV 100.0 03/21/2021   PLT 34 (L) 03/21/2021     STUDIES: No results found.  ASSESSMENT: ITP  PLAN:    1.  ITP- Confirmed by bone marrow with normal FISH and cytogenetics on 07/22/2019.  She had a positive ANA which is likely clinically insignificant.  The remainder of her lab work stated normal within normal range.  CT scan from 03/04/2017 did not reveal splenomegaly.  Initially started on prednisone with no appreciable improvement.  Rituxan was denied by insurance and she was started on Nplate.  Goal was to maintain a platelet count above 100.  Labs from today show platelet count of 40,000.  Proceed with Nplate.  May need to consider switching treatment to Rituxan in the future should platelet counts continue to decline.    2.  Right foot injury- Continues to improve.  She is followed by podiatry Dr. Caryl Comes.  3.  Low back pain- Secondary to boot she was wearing for her right foot pain.  Since she stopped wearing, low back pain has resolved.  4.  Change in appetite- Reports some nausea especially with food intake.  Weight is  stable.  She will follow-up with PCP in the next couple weeks.  Continue to monitor.  Disposition: Proceed with Nplate today.  Return to clinic every 3 weeks for repeat labs and Nplate.  Return to clinic in 12 weeks for lab work, MD assessment and possible Nplate.  I spent 25 minutes dedicated to the care of this patient (face-to-face and non-face-to-face) on the date of the encounter to include what is described in the assessment and plan.  Patient expressed understanding and was in agreement with this plan. She also understands that She can call clinic at any time with any questions, concerns, or complaints.    Jacquelin Hawking, NP   03/21/2021 1:38 PM

## 2021-03-22 ENCOUNTER — Telehealth: Payer: BC Managed Care – PPO | Admitting: Family Medicine

## 2021-03-22 DIAGNOSIS — R1030 Lower abdominal pain, unspecified: Secondary | ICD-10-CM

## 2021-03-22 DIAGNOSIS — R197 Diarrhea, unspecified: Secondary | ICD-10-CM | POA: Diagnosis not present

## 2021-03-22 NOTE — Patient Instructions (Addendum)
I appreciate the opportunity to provide you with care for your health and wellness.  I hope you feel better soon!  Would benefit from a colonoscopy if you have not had one Brat diet, bentyl use, imodium as needed- but limited to prevent constipation Advised no alcohol during this time as well   Please continue to practice social distancing to keep you, your family, and our community safe.  If you must go out, please wear a mask and practice good handwashing.  Have a wonderful day. With Gratitude, Cherly Beach, DNP, AGNP-BC   Criss Rosales Diet A bland diet consists of foods that are often soft and do not have a lot of fat, fiber, or extra seasonings. Foods without fat, fiber, or seasoning are easier for the body to digest. They are also less likely to irritate your mouth, throat, stomach, and other parts of your digestive system. A bland diet is sometimes called a BRAT diet. What is my plan? Your health care provider or food and nutrition specialist (dietitian) may recommend specific changes to your diet to prevent symptoms or to treat your symptoms. These changes may include: Eating small meals often. Cooking food until it is soft enough to chew easily. Chewing your food well. Drinking fluids slowly. Not eating foods that are very spicy, sour, or fatty. Not eating citrus fruits, such as oranges and grapefruit. What do I need to know about this diet? Eat a variety of foods from the bland diet food list. Do not follow a bland diet longer than needed. Ask your health care provider whether you should take vitamins or supplements. What foods can I eat? Grains Hot cereals, such as cream of wheat. Rice. Bread, crackers, or tortillas made from refined white flour. Vegetables Canned or cooked vegetables. Mashed or boiled potatoes. Fruits Bananas. Applesauce. Other types of cooked or canned fruit with the skin and seeds removed, such as canned peaches or pears. Meats and other proteins Scrambled  eggs. Creamy peanut butter or other nut butters. Lean, well-cooked meats, such as chicken or fish. Tofu. Soups or broths. Dairy Low-fat dairy products, such as milk, cottage cheese, or yogurt. Beverages Water. Herbal tea. Apple juice. Fats and oils Mild salad dressings. Canola or olive oil. Sweets and desserts Pudding. Custard. Fruit gelatin. Ice cream. The items listed above may not be a complete list of recommended foods and beverages. Contact a dietitian for more options. What foods are not recommended? Grains Whole grain breads and cereals. Vegetables Raw vegetables. Fruits Raw fruits, especially citrus, berries, or dried fruits. Dairy Whole fat dairy foods. Beverages Caffeinated drinks. Alcohol. Seasonings and condiments Strongly flavored seasonings or condiments. Hot sauce. Salsa. Other foods Spicy foods. Fried foods. Sour foods, such as pickled or fermented foods. Foods with high sugar content. Foods high in fiber. The items listed above may not be a complete list of foods and beverages to avoid. Contact a dietitian for more information. Summary A bland diet consists of foods that are often soft and do not have a lot of fat, fiber, or extra seasonings. Foods without fat, fiber, or seasoning are easier for the body to digest. Check with your health care provider to see how long you should follow this diet plan. It is not meant to be followed for long periods. This information is not intended to replace advice given to you by your health care provider. Make sure you discuss any questions you have with your health care provider. Document Revised: 07/31/2017 Document Reviewed: 07/31/2017 Elsevier Patient Education  [place].

## 2021-03-22 NOTE — Progress Notes (Signed)
Carly [name] are scheduled for a virtual visit with your provider today.    Just as we do with appointments in the office, we must obtain your consent to participate.  Your consent will be active for this visit and any virtual visit you may have with one of our providers in the next 365 days.    If you have a MyChart account, I can also send a copy of this consent to you electronically.  All virtual visits are billed to your insurance company just like a traditional visit in the office.  As this is a virtual visit, video technology does not allow for your provider to perform a traditional examination.  This may limit your provider's ability to fully assess your condition.  If your provider identifies any concerns that need to be evaluated in person or the need to arrange testing such as labs, EKG, etc, we will make arrangements to do so.    Although advances in technology are sophisticated, we cannot ensure that it will always work on either your end or our end.  If the connection with a video visit is poor, we may have to switch to a telephone visit.  With either a video or telephone visit, we are not always able to ensure that we have a secure connection.   I need to obtain your verbal consent now.   Are you willing to proceed with your visit today?   Carly [name] has provided verbal consent on 03/22/2021 for a virtual visit (video or telephone).    Carly Mayo, NP 03/22/2021  3:13 PM   Date:  03/22/2021   ID:  Carly [name], DOB [birth year], MRN [id number]  Patient Location: Home Provider Location: Home Office   Participants: Patient and Provider for Visit and Wrap up  Method of visit: Video  Location of Patient: Home Location of Provider: Home Office Consent was obtain for visit over the video. Services rendered by provider: Visit was performed via video  A video enabled telemedicine application was used and I verified that I am speaking with the correct person using two  identifiers.  PCP:  McLean-Scocuzza, Nino Glow, MD   Chief Complaint:  diarrhea  History of Present Illness:    Carly [name] is a 50 y.o. female with history as stated below. Presents video telehealth for an acute care visit due to upset stomach. She reports she feels like she is bloated, not empty when going to the bathroom.  No blood in stool. Strains at times- cramping (which is in the lower abdomen) and will try to push to finish the BM. Does not know if she has hemorrhoids. Stool color is reported as muddy water when having diarrhea, and when it is more solid a dark brown. Stool floats and is greasy. Appetite is reduced. Stools usually occur within one hour post meal.   Denies having fevers, chills, shortness of breath, cough, chest pain, ear pain, sore throat or exposure to covid or other sick contacts. Modifying factors include: pepto- limited relief, and cramping medication "bentyl" from when she had food poisoning. No other aggravating or relieving factors-eating triggers it, drinking beef broth and thinks that helps some.    No other c/o.   Past Medical, Surgical, Social History, Allergies, and Medications have been Reviewed.  Past Medical History:  Diagnosis Date   Anxiety    Chicken pox    Depression    Elevated testosterone level in female    Emphysema of lung ([place])  bronchitis   Frequent headaches    Hay fever    Hypertension    Idiopathic thrombocytopenic purpura (ITP) ([facility])    Positive ANA (antinuclear antibody)     No outpatient medications have been marked as taking for the 03/22/21 encounter (Appointment) with [place].     Allergies:   Meloxicam   ROS See HPI for history of present illness.  Physical Exam Constitutional:      Appearance: Normal appearance.  HENT:     Head: Normocephalic and atraumatic.     Nose: Nose normal.  Eyes:     Conjunctiva/sclera: Conjunctivae normal.  Cardiovascular:     Pulses: Normal pulses.   Musculoskeletal:        General: Normal range of motion.     Cervical back: Normal range of motion.  Neurological:     Mental Status: She is alert and oriented to person, place, and time.              A&P 1. Diarrhea, unspecified type S&S - not bloody that she can see, greasy at times and post meal usually. Gallbladder might need to be r/o Limited upper abdominal pain-Diff Dx could be pancreatitis. Additionally- pressure in rectum- hemorrhoid, colon issues including but not limited to diverticulosis should be r/o as well   Would benefit from a colonoscopy if she has not had one Brat diet, bentyl use, imodium as needed- but limited reviewed Advised no alcohol during this time as well   2. Lower abdominal pain Additionally- pressure in rectum- hemorrhoid, colon issues including but not limited to diverticulosis should be r/o as well     Reviewed side effects, risks and benefits of medication.   Patient acknowledged agreement and understanding of the plan.   I discussed the assessment and treatment plan with the patient. The patient was provided an opportunity to ask questions and all were answered. The patient agreed with the plan and demonstrated an understanding of the instructions.   The patient was advised to call back or seek an in-person evaluation if the symptoms worsen or if the condition fails to improve as anticipated.   The above assessment and management plan was discussed with the patient. The patient verbalized understanding of and has agreed to the management plan. Patient is aware to call the clinic if symptoms persist or worsen. Patient is aware when to return to the clinic for a follow-up visit. Patient educated on when it is appropriate to go to the emergency department.   Time:   Today, I have spent 21 minutes with the patient with telehealth technology discussing the above problems, reviewing the chart, previous notes, medications and orders.   Medication  Changes: No orders of the defined types were placed in this encounter.    Disposition:  Follow up PCP as scheduled in the next month Signed, Carly Mayo, NP  03/22/2021 3:13 PM

## 2021-03-31 ENCOUNTER — Other Ambulatory Visit: Payer: Self-pay | Admitting: Internal Medicine

## 2021-03-31 DIAGNOSIS — J45901 Unspecified asthma with (acute) exacerbation: Secondary | ICD-10-CM

## 2021-03-31 DIAGNOSIS — J441 Chronic obstructive pulmonary disease with (acute) exacerbation: Secondary | ICD-10-CM

## 2021-04-08 ENCOUNTER — Encounter (HOSPITAL_BASED_OUTPATIENT_CLINIC_OR_DEPARTMENT_OTHER): Payer: Self-pay | Admitting: Emergency Medicine

## 2021-04-08 ENCOUNTER — Emergency Department (HOSPITAL_BASED_OUTPATIENT_CLINIC_OR_DEPARTMENT_OTHER)
Admission: EM | Admit: 2021-04-08 | Discharge: 2021-04-08 | Disposition: A | Discharge status: Home / Self Care | Payer: BC Managed Care – PPO | Attending: Emergency Medicine | Admitting: Emergency Medicine

## 2021-04-08 ENCOUNTER — Emergency Department (HOSPITAL_BASED_OUTPATIENT_CLINIC_OR_DEPARTMENT_OTHER): Payer: BC Managed Care – PPO

## 2021-04-08 ENCOUNTER — Other Ambulatory Visit: Payer: Self-pay

## 2021-04-08 DIAGNOSIS — N83202 Unspecified ovarian cyst, left side: Secondary | ICD-10-CM | POA: Diagnosis not present

## 2021-04-08 DIAGNOSIS — Z79899 Other long term (current) drug therapy: Secondary | ICD-10-CM | POA: Insufficient documentation

## 2021-04-08 DIAGNOSIS — R1012 Left upper quadrant pain: Secondary | ICD-10-CM

## 2021-04-08 DIAGNOSIS — E876 Hypokalemia: Secondary | ICD-10-CM

## 2021-04-08 DIAGNOSIS — N83201 Unspecified ovarian cyst, right side: Secondary | ICD-10-CM

## 2021-04-08 DIAGNOSIS — R1030 Lower abdominal pain, unspecified: Secondary | ICD-10-CM | POA: Diagnosis not present

## 2021-04-08 DIAGNOSIS — D1771 Benign lipomatous neoplasm of kidney: Secondary | ICD-10-CM | POA: Insufficient documentation

## 2021-04-08 DIAGNOSIS — R109 Unspecified abdominal pain: Secondary | ICD-10-CM | POA: Diagnosis not present

## 2021-04-08 DIAGNOSIS — I10 Essential (primary) hypertension: Secondary | ICD-10-CM | POA: Diagnosis not present

## 2021-04-08 DIAGNOSIS — J449 Chronic obstructive pulmonary disease, unspecified: Secondary | ICD-10-CM | POA: Diagnosis not present

## 2021-04-08 DIAGNOSIS — K219 Gastro-esophageal reflux disease without esophagitis: Secondary | ICD-10-CM | POA: Insufficient documentation

## 2021-04-08 DIAGNOSIS — M549 Dorsalgia, unspecified: Secondary | ICD-10-CM | POA: Diagnosis not present

## 2021-04-08 DIAGNOSIS — F17210 Nicotine dependence, cigarettes, uncomplicated: Secondary | ICD-10-CM | POA: Insufficient documentation

## 2021-04-08 DIAGNOSIS — Z7952 Long term (current) use of systemic steroids: Secondary | ICD-10-CM | POA: Diagnosis not present

## 2021-04-08 DIAGNOSIS — R9431 Abnormal electrocardiogram [ECG] [EKG]: Secondary | ICD-10-CM | POA: Diagnosis not present

## 2021-04-08 DIAGNOSIS — R1032 Left lower quadrant pain: Secondary | ICD-10-CM | POA: Diagnosis not present

## 2021-04-08 LAB — CBC WITH DIFFERENTIAL/PLATELET
Abs Immature Granulocytes: 0.02 10*3/uL (ref 0.00–0.07)
Basophils Absolute: 0.1 10*3/uL (ref 0.0–0.1)
Basophils Relative: 1 %
Eosinophils Absolute: 0.2 10*3/uL (ref 0.0–0.5)
Eosinophils Relative: 3 %
HCT: 44.8 % (ref 36.0–46.0)
Hemoglobin: 15.3 g/dL — ABNORMAL HIGH (ref 12.0–15.0)
Immature Granulocytes: 0 %
Lymphocytes Relative: 25 %
Lymphs Abs: 1.9 10*3/uL (ref 0.7–4.0)
MCH: 33.8 pg (ref 26.0–34.0)
MCHC: 34.2 g/dL (ref 30.0–36.0)
MCV: 99.1 fL (ref 80.0–100.0)
Monocytes Absolute: 0.7 10*3/uL (ref 0.1–1.0)
Monocytes Relative: 10 %
Neutro Abs: 4.7 10*3/uL (ref 1.7–7.7)
Neutrophils Relative %: 61 %
Platelets: 44 10*3/uL — ABNORMAL LOW (ref 150–400)
RBC: 4.52 MIL/uL (ref 3.87–5.11)
RDW: 12.7 % (ref 11.5–15.5)
WBC: 7.6 10*3/uL (ref 4.0–10.5)
nRBC: 0 % (ref 0.0–0.2)

## 2021-04-08 LAB — COMPREHENSIVE METABOLIC PANEL
ALT: 23 U/L (ref 0–44)
AST: 51 U/L — ABNORMAL HIGH (ref 15–41)
Albumin: 3.8 g/dL (ref 3.5–5.0)
Alkaline Phosphatase: 70 U/L (ref 38–126)
Anion gap: 11 (ref 5–15)
BUN: <5 mg/dL — ABNORMAL LOW (ref 6–20)
CO2: 25 mmol/L (ref 22–32)
Calcium: 8.9 mg/dL (ref 8.9–10.3)
Chloride: 104 mmol/L (ref 98–111)
Creatinine, Ser: 0.49 mg/dL (ref 0.44–1.00)
GFR, Estimated: >60 mL/min (ref >60)
Glucose, Bld: 99 mg/dL (ref 70–99)
Potassium: 3 mmol/L — ABNORMAL LOW (ref 3.5–5.1)
Sodium: 140 mmol/L (ref 135–145)
Total Bilirubin: 0.6 mg/dL (ref 0.3–1.2)
Total Protein: 6.3 g/dL — ABNORMAL LOW (ref 6.5–8.1)

## 2021-04-08 LAB — LIPASE, BLOOD: Lipase: 23 U/L (ref 11–51)

## 2021-04-08 MED ORDER — IOHEXOL 350 MG/ML SOLN
80.0000 mL | Freq: Once | INTRAVENOUS | Status: AC | PRN
  Administered 2021-04-08: 80 mL via INTRAVENOUS

## 2021-04-08 MED ORDER — MORPHINE SULFATE (PF) 4 MG/ML IV SOLN
4.0000 mg | Freq: Once | INTRAVENOUS | Status: AC
  Administered 2021-04-08: 4 mg via INTRAVENOUS
  Filled 2021-04-08: qty 1

## 2021-04-08 MED ORDER — ONDANSETRON HCL 4 MG/2ML IJ SOLN
4.0000 mg | Freq: Once | INTRAMUSCULAR | Status: AC
Start: 2021-04-08 — End: 2021-04-08
  Administered 2021-04-08: 4 mg via INTRAVENOUS
  Filled 2021-04-08: qty 2

## 2021-04-08 MED ORDER — POTASSIUM CHLORIDE 10 MEQ/100ML IV SOLN
10.0000 meq | Freq: Once | INTRAVENOUS | Status: DC
  Administered 2021-04-08: 10 meq via INTRAVENOUS
  Filled 2021-04-08: qty 100

## 2021-04-08 MED ORDER — POTASSIUM CHLORIDE CRYS ER 20 MEQ PO TBCR
40.0000 meq | EXTENDED_RELEASE_TABLET | Freq: Once | ORAL | Status: AC
  Administered 2021-04-08: 40 meq via ORAL
  Filled 2021-04-08: qty 2

## 2021-04-08 MED ORDER — POTASSIUM CHLORIDE ER 10 MEQ PO TBCR: 10.0000 meq | EXTENDED_RELEASE_TABLET | Freq: Every day | ORAL | 0 refills | Status: DC

## 2021-04-08 NOTE — ED Triage Notes (Addendum)
Pt c/o left sided back pain that radiates to left abdominal area onset at midnight with nausea. Pt seen at [facility] and was told to come to emergency room. Pt denies change in diet or others with illness.

## 2021-04-08 NOTE — ED Provider Notes (Signed)
[facility] EMERGENCY DEPT Provider Note   CSN: [id number] Arrival date & time: 04/08/21  1353     History Chief Complaint  Patient presents with   Abdominal Pain    Carly [name] is a 50 y.o. female with Pmhx HTN, chronic ITP, COPD who presents to the ED today with complaint of gradual onset, constant, worsening, left flank pain radiating throughout upper abdomen that began around 2 AM this morning. Pt also complains of nausea.  She went to [place] [facility] earlier today and had a urinalysis done which did not show any acute findings.  She states that she was advised to go to the ED however stated that she was feeling slightly better and decided not to come initially.  She went out to eat for lunch however began having worsening pain afterwards prompting ED visit at this time.  She states she last had a normal bowel movement earlier this morning.  She states that she has chronic diarrhea that is no worse than normal for her.  She denies any vomiting.  No fevers.  Past surgical history includes tubal ligation.  She denies any history of kidney stones.  She denies any heavy EtOH use.  She denies any heavy NSAID use.  No recent sick contacts.  The history is provided by the patient and medical records.      Past Medical History:  Diagnosis Date   Anxiety    Chicken pox    Depression    Elevated testosterone level in female    Emphysema of lung (HCC)    bronchitis   Frequent headaches    Hay fever    Hypertension    Idiopathic thrombocytopenic purpura (ITP) (HCC)    Positive ANA (antinuclear antibody)     Patient Active Problem List   Diagnosis Date Noted   Gastroesophageal reflux disease without esophagitis 07/01/2020   Hyperlipidemia 07/01/2020   Posttraumatic vulvar hematoma 04/07/2020   Dizziness 03/29/2020   Fatty liver 12/24/2019   Angiolipoma of left kidney 12/24/2019   Chronic ITP (idiopathic thrombocytopenia) (HCC) 12/24/2019   Chronic ITP (idiopathic  thrombocytopenic purpura) (HCC) 12/02/2019   Idiopathic thrombocytopenic purpura (ITP) (HCC) 11/10/2019   Verruca vulgaris 09/29/2019   Plantar fasciitis, bilateral 09/29/2019   Osteoarthritis of both knees 09/29/2019   Overactive bladder 07/14/2019   B12 deficiency 06/10/2019   Vitamin D deficiency 06/10/2019   Headache 01/30/2019   Elevated LFTs 01/30/2019   Abnormal uterine bleeding (AUB) 08/26/2018   Depression, major, single episode, moderate ([place]) 08/04/2018   Iron deficiency 09/02/2017   Menorrhagia with regular cycle 10/22/2016   Insomnia 08/15/2016   Nocturia 06/18/2016   Thrombocytopenia ([place]) 02/12/2016   COPD exacerbation ([place]) 01/31/2016   Carpal tunnel syndrome 01/20/2016   Lateral epicondylitis 01/20/2016   Obesity 01/04/2016   Palpitations 12/15/2015   Myalgia 10/31/2015   Fatigue 10/31/2015   Mixed incontinence 03/09/2015   COPD (chronic obstructive pulmonary disease) ([place]) 01/13/2015   Acute bronchitis 11/15/2014   Female hirsutism 09/22/2014   Elevated testosterone level in female 09/22/2014   Generalized anxiety disorder 08/23/2014   Tobacco use disorder 08/23/2014   Obesity (BMI 30-39.9) 08/23/2014   HTN (hypertension) 08/23/2014    Past Surgical History:  Procedure Laterality Date   HEMATOMA EVACUATION N/A 04/07/2020   Procedure: EVACUATION HEMATOMA  AND APPLICATION OF PRESSURE DRESSING;  Surgeon: Benjaman Kindler, MD;  Location: [facility] ORS;  Service: Gynecology;  Laterality: N/A;   HYSTEROSCOPY WITH NOVASURE N/A 02/21/2018   Procedure: HYSTEROSCOPY WITH NOVASURE, POLYPECTOMY;  Surgeon: Benjaman Kindler, MD;  Location: [facility] ORS;  Service: Gynecology;  Laterality: N/A;   TUBAL LIGATION  1997   TUBAL LIGATION       OB History   No obstetric history on file.     Family History  Problem Relation Age of Onset   Hyperlipidemia Mother    Heart disease Mother    Stroke Mother    Depression Mother    Mental illness Mother    Heart attack Mother 28    Hyperlipidemia Father    Depression Father    Mental illness Father    Diabetes Paternal Grandfather    Cancer Cousin     Social History   Tobacco Use   Smoking status: Every Day    Packs/day: 1.50    Years: 28.00    Pack years: 42.00    Types: Cigarettes   Smokeless tobacco: Never  Vaping Use   Vaping Use: Never used  Substance Use Topics   Alcohol use: Yes    Alcohol/week: 8.0 standard drinks    Types: 8 Standard drinks or equivalent per week    Comment: Beer (Can)    Drug use: No    Comment: CBD oil    Home Medications Prior to Admission medications   Medication Sig Start Date End Date Taking? Authorizing Provider  albuterol (VENTOLIN HFA) 108 (90 Base) MCG/ACT inhaler Inhale 2 puffs into the lungs every 6 (six) hours as needed for wheezing or shortness of breath (cough). 01/08/21  Yes Phelps, Erin O, PA-C  ALPRAZolam (XANAX) 0.25 MG tablet TAKE 1 TABLET BY MOUTH DAILY AS NEEDED FOR ANXIETY 10/20/20  Yes McLean-Scocuzza, Nino Glow, MD  carvedilol (COREG) 25 MG tablet Take 1 tablet (25 mg total) by mouth 2 (two) times daily with a meal. 01/03/21  Yes McLean-Scocuzza, Nino Glow, MD  Cholecalciferol 1.25 MG (50000 UT) capsule Take 1 capsule (50,000 Units total) by mouth every 30 (thirty) days. 1x per month note this 05/31/20  Yes McLean-Scocuzza, Nino Glow, MD  cyanocobalamin (,VITAMIN B-12,) 1000 MCG/ML injection Inject 1 mL (1,000 mcg total) into the muscle every 30 (thirty) days. 05/02/20  Yes McLean-Scocuzza, Nino Glow, MD  cyclobenzaprine (FLEXERIL) 10 MG tablet Take 1 tablet (10 mg total) by mouth 3 (three) times daily as needed for muscle spasms. 02/14/21  Yes Brunetta Jeans, PA-C  diclofenac Sodium (VOLTAREN) 1 % GEL Apply 2-4 g topically 4 (four) times daily. 2 grams upper body qid prn and 4 gram lower body qid prn 11/03/20  Yes McLean-Scocuzza, Nino Glow, MD  dicyclomine (BENTYL) 10 MG capsule Take 1 capsule (10 mg total) by mouth 3 (three) times daily as needed for spasms. Before  food 04/18/20  Yes McLean-Scocuzza, Nino Glow, MD  escitalopram (LEXAPRO) 20 MG tablet TAKE 1 TABLET BY MOUTH EVERY DAY 01/03/21  Yes McLean-Scocuzza, Nino Glow, MD  fluticasone (FLONASE) 50 MCG/ACT nasal spray Place 2 sprays into both nostrils daily. 01/08/21  Yes Phelps, Bronwen Betters, PA-C  Fluticasone-Umeclidin-Vilant (TRELEGY ELLIPTA) 200-62.5-25 MCG/INH AEPB Inhale 1 puff into the lungs daily. 04/28/20  Yes Tyler Pita, MD  naproxen (NAPROSYN) 500 MG tablet Take 1 tablet (500 mg total) by mouth 2 (two) times daily with a meal. 02/14/21  Yes Brunetta Jeans, PA-C  omeprazole (PRILOSEC) 20 MG capsule Take 1 capsule (20 mg total) by mouth daily. 07/01/20  Yes McLean-Scocuzza, Nino Glow, MD  potassium chloride (KLOR-CON) 10 MEQ tablet Take 1 tablet (10 mEq total) by mouth daily for 5 days. 04/08/21  04/13/21 Yes Denishia Citro, PA-C  traMADol (ULTRAM) 50 MG tablet Take 1-2 tablets (50-100 mg total) by mouth every 6 (six) hours as needed. 01/30/21  Yes Jacquelin Hawking, NP  traZODone (DESYREL) 50 MG tablet Take 0.5-1 tablets (25-50 mg total) by mouth at bedtime as needed for sleep. 07/01/20  Yes McLean-Scocuzza, Nino Glow, MD  acetaminophen (TYLENOL) 500 MG tablet Take 500 mg by mouth every 6 (six) hours as needed.    [provider]  amLODipine (NORVASC) 10 MG tablet Take 1 tablet (10 mg total) by mouth daily. 01/03/21   McLean-Scocuzza, Nino Glow, MD  cetirizine (ZYRTEC) 10 MG tablet Take 1 tablet (10 mg total) by mouth daily. 09/29/19   McLean-Scocuzza, Nino Glow, MD  ibuprofen (ADVIL) 600 MG tablet Take 600 mg by mouth every 6 (six) hours as needed.    [provider]  mirabegron ER (MYRBETRIQ) 25 MG TB24 tablet Take 1 tablet (25 mg total) by mouth daily. 05/22/19   McLean-Scocuzza, Nino Glow, MD  ondansetron (ZOFRAN ODT) 4 MG disintegrating tablet Take 1 tablet (4 mg total) by mouth every 8 (eight) hours as needed for nausea or vomiting. 12/29/20   Brunetta Jeans, PA-C  spironolactone (ALDACTONE)  25 MG tablet Take 1 tablet (25 mg total) by mouth daily. In am 09/29/19   McLean-Scocuzza, Nino Glow, MD  Syringe/Needle, Disp, 25G X 1-1/2" 1 ML MISC 1 Device by Does not apply route as directed. 11/03/20   McLean-Scocuzza, Nino Glow, MD    Allergies    Meloxicam  Review of Systems   Review of Systems  Constitutional:  Negative for chills and fever.  Respiratory:  Negative for shortness of breath.   Cardiovascular:  Negative for chest pain.  Gastrointestinal:  Positive for abdominal pain, diarrhea (chronic, no worse than normal) and nausea. Negative for constipation and vomiting.  Genitourinary:  Positive for flank pain. Negative for dysuria and frequency.  All other systems reviewed and are negative.  Physical Exam Updated Vital Signs BP (!) 165/84   Pulse 89   Temp 98.2 F (36.8 C)   Resp 18   Ht 5\' 2"  (1.575 m)   Wt 88.5 kg   SpO2 96%   BMI 35.67 kg/m   Physical Exam Vitals and nursing note reviewed.  Constitutional:      Appearance: She is obese. She is not ill-appearing or diaphoretic.  HENT:     Head: Normocephalic and atraumatic.  Eyes:     Conjunctiva/sclera: Conjunctivae normal.  Cardiovascular:     Rate and Rhythm: Normal rate and regular rhythm.     Heart sounds: Normal heart sounds.  Pulmonary:     Effort: Pulmonary effort is normal.     Breath sounds: Normal breath sounds. No wheezing or rales.  Abdominal:     General: Abdomen is flat.     Palpations: Abdomen is soft.     Tenderness: There is abdominal tenderness in the right upper quadrant, epigastric area and left upper quadrant. There is left CVA tenderness. There is no guarding or rebound.  Musculoskeletal:     Cervical back: Neck supple.  Skin:    General: Skin is warm and dry.  Neurological:     Mental Status: She is alert.    ED Results / Procedures / Treatments   Labs (all labs ordered are listed, but only abnormal results are displayed) Labs Reviewed  COMPREHENSIVE METABOLIC PANEL -  Abnormal; Notable for the following components:      Result Value  Potassium 3.0 (*)    BUN <5 (*)    Total Protein 6.3 (*)    AST 51 (*)    All other components within normal limits  CBC WITH DIFFERENTIAL/PLATELET - Abnormal; Notable for the following components:   Hemoglobin 15.3 (*)    Platelets 44 (*)    All other components within normal limits  LIPASE, BLOOD    EKG EKG Interpretation  Date/Time:  Saturday April 08 2021 14:25:07 EDT Ventricular Rate:  82 PR Interval:  119 QRS Duration: 89 QT Interval:  395 QTC Calculation: 462 R Axis:   71 Text Interpretation: Sinus rhythm Borderline short PR interval Consider right atrial enlargement Confirmed by Octaviano Glow [phone]) on 04/08/2021 2:32:56 PM  Radiology CT Abdomen Pelvis W Contrast  Result Date: 04/08/2021 CLINICAL DATA:  Nonlocalized acute abdominal pain. Left-sided back pain that radiates to left abdominal area. EXAM: CT ABDOMEN AND PELVIS WITH CONTRAST TECHNIQUE: Multidetector CT imaging of the abdomen and pelvis was performed using the standard protocol following bolus administration of intravenous contrast. CONTRAST:  63mL OMNIPAQUE IOHEXOL 350 MG/ML SOLN COMPARISON:  None. FINDINGS: Lower chest: Bilateral lower lobe subsegmental atelectasis. Hepatobiliary: The liver is enlarged measuring up to 20 cm. No focal liver abnormality. No gallstones, gallbladder wall thickening, or pericholecystic fluid. No biliary dilatation. Pancreas: No focal lesion. Normal pancreatic contour. No surrounding inflammatory changes. No main pancreatic ductal dilatation. Spleen: Normal in size without focal abnormality. Adrenals/Urinary Tract: No adrenal nodule bilaterally. Bilateral kidneys enhance symmetrically. Fat density lesions subcentimeter in size within left kidney likely represents a angiomyolipoma. No hydronephrosis. No hydroureter. The urinary bladder is unremarkable. Stomach/Bowel: Stomach is within normal limits. No evidence of  bowel wall thickening or dilatation. Appendix appears normal. Vascular/Lymphatic: No abdominal aorta or iliac aneurysm. Mild atherosclerotic plaque of the aorta and its branches. No abdominal, pelvic, or inguinal lymphadenopathy. Reproductive: There is a 3.6 cm right ovarian simple appearing cystic lesion. Vague hyperdense lesion within the uterine walls suggestive of uterine fibroids. Otherwise uterus and bilateral adnexa are unremarkable. Other: No intraperitoneal free fluid. No intraperitoneal free gas. No organized fluid collection. Musculoskeletal: No abdominal wall hernia or abnormality. No suspicious lytic or blastic osseous lesions. No acute displaced fracture. Multilevel degenerative changes of the spine. IMPRESSION: 1. Subcentimeter fat density lesion within left kidney likely representing a angiomyolipoma. 2. A 3.6 cm right ovarian simple appearing cystic lesion. If patient premenopausal : No follow-up imaging recommended. Note: This recommendation does not apply to premenarchal patients and to those with increased risk (genetic, family history, elevated tumor markers or other high-risk factors) of ovarian cancer. Reference: JACR 2020 Feb; 17(2):248-254. If patient postmenopausal: Recommend follow-up US in 6-12 months. Note: This recommendation does not apply to premenarchal patients and to those with increased risk (genetic, family history, elevated tumor markers or other high-risk factors) of ovarian cancer. Reference: JACR 2020 Feb; 17(2):248-254. 3. Question intramural uterine fibroids. 4. Hepatomegaly. 5.  Aortic Atherosclerosis (ICD10-I70.0). Electronically Signed   By: Iven Finn M.D.   On: 04/08/2021 16:50    Procedures Procedures   Medications Ordered in ED Medications  ondansetron (ZOFRAN) injection 4 mg (4 mg Intravenous Given 04/08/21 1451)  morphine 4 MG/ML injection 4 mg (4 mg Intravenous Given 04/08/21 1450)  iohexol (OMNIPAQUE) 350 MG/ML injection 80 mL (80 mLs Intravenous  Contrast Given 04/08/21 1633)  potassium chloride SA (KLOR-CON) CR tablet 40 mEq (40 mEq Oral Given 04/08/21 1703)    ED Course  I have reviewed the triage vital signs and the  nursing notes.  Pertinent labs & imaging results that were available during my care of the patient were reviewed by me and considered in my medical decision making (see chart for details).  Clinical Course as of 04/08/21 1704  Sat Apr 08, 2021  1624 Platelets(!): 44 Chronic  [MV]  1624 Potassium(!): 3.0 IV potassium ordered initially however pt states she is unable to tolerate IV potassium; will switch to oral pending CT scan read.  [MV]    Clinical Course User Index [MV] Eustaquio Maize, PA-C   MDM Rules/Calculators/A&P                           50 year old female presents to the ED today with left-sided flank pain radiating into her upper abdomen that began around 2 AM this morning with associated nausea.  Had urinalysis done earlier today which was negative.  On arrival to the ED today patient is afebrile, nontachycardic and nontachypneic.  She does appear uncomfortable on exam.  She is noted to have left CVA tenderness as well as positive left upper quadrant, epigastric, right upper quadrant tenderness palpation.  Question kidney stone versus pancreatitis versus GERD versus peptic ulcer disease versus gallbladder etiology.  We will plan for labs including CBC, CMP, lipase.  Do not feel we need to repeat urinalysis at this time, see below for results earlier today.  We will plan for pain medication and antiemetics.  Depending on lab work will consider right upper quadrant ultrasound versus CT scan  Component Value Ref Range Test Method Analysis Time Performed At Pathologist Signature  Color Yellow Yellow, Violet, Light Violet, Dark Violet   04/08/2021 9:53 AM EDT [place], Other   04/08/2021 9:53 AM EDT [place] - LAB    Specific Gravity <=1.005 1.000 - 1.030    04/08/2021 9:53 AM EDT [place] - LAB    pH, Urine 6.0 5.0 - 8.0   04/08/2021 9:53 AM EDT [place] - LAB    Protein, Urinalysis Negative Negative, Trace mg/dL   04/08/2021 9:53 AM EDT [place] - LAB    Glucose, Urinalysis Negative Negative mg/dL   04/08/2021 9:53 AM EDT [place] - LAB    Ketones, Urinalysis Negative Negative mg/dL   04/08/2021 9:53 AM EDT [place] - LAB    Blood, Urinalysis Negative Negative   04/08/2021 9:53 AM EDT [place] - LAB    Nitrite, Urinalysis Negative Negative   04/08/2021 9:53 AM EDT [place] - LAB    Leukocyte Esterase, Urinalysis Negative Negative   04/08/2021 9:53 AM EDT [place] - LAB    White Blood Cells, Urinalysis None Seen None Seen, 0-3 /hpf   04/08/2021 9:53 AM EDT [place] - LAB    Red Blood Cells, Urinalysis None Seen None Seen, 0-3 /hpf   04/08/2021 9:53 AM EDT [place] - LAB    Bacteria, Urinalysis Rare (A) None Seen /hpf   04/08/2021 9:53 AM EDT [place] - LAB    Squamous Epithelial Cells, Urinalysis Rare Rare, Few, None Seen /hpf   04/08/2021 9:53 AM EDT [place] - LAB     CBC without leukocytosis. Hgb stable. Platelets 44; chronic ITP. Appears improved from previous. No active bleeding.  CMP with potassium 3.0; will replete. AST slightly elevated at 51 however compared to previous it is unchanged.  Remainder of Lfts unremarkable.  Lipase 23.   Will plan for CT A/P at this time for further evaluation of pain given no obvious LFT findings and pain starting in the left flank.   CT: IMPRESSION:  1. Subcentimeter fat density lesion within left kidney likely  representing a angiomyolipoma.  2. A 3.6 cm right ovarian simple appearing cystic lesion. If patient  premenopausal : No follow-up imaging recommended. Note: This  recommendation does not apply to premenarchal patients and to those  with increased risk  (genetic, family history, elevated tumor markers  or other high-risk factors) of ovarian cancer. Reference: JACR 2020  Feb; 17(2):248-254. If patient postmenopausal: Recommend follow-up  US in 6-12 months. Note: This recommendation does not apply to  premenarchal patients and to those with increased risk (genetic,  family history, elevated tumor markers or other high-risk factors)  of ovarian cancer. Reference: JACR 2020 Feb; 17(2):248-254.  3. Question intramural uterine fibroids.  4. Hepatomegaly.  5.  Aortic Atherosclerosis (ICD10-I70.0).   On repeat eval pt is resting comfortably. She reports improvement in pain. Will have pt follow up with PCP regarding ED visit today. Do not feel she requires further workup at this time for her left kidney cyst. Discussed case with attending physician Dr. Alvino Chapel who agrees. Question PUD at this time? Pt takes 20 mg omeprazole daily. Have encouraged increase to 40 mg - she just picked up a new prescription and will take double the dose instead of a new Rx which I think is reasonable. Will also discharge with oral potassium. Pt advised to have potassium level rechecked in 1-2 weeks. She is in agreement with plan and stable for discharge home.   This note was prepared using Dragon voice recognition software and may include unintentional dictation errors due to the inherent limitations of voice recognition software.   Final Clinical Impression(s) / ED Diagnoses Final diagnoses:  Left upper quadrant abdominal pain  Angiomyolipoma of left kidney  Cyst of right ovary  Hypokalemia    Rx / DC Orders ED Discharge Orders          Ordered    potassium chloride (KLOR-CON) 10 MEQ tablet  Daily        04/08/21 1703             Discharge Instructions      Please pick up potassium supplements and take as prescribed over the next week. You will need to have your potassium level rechecked in 1-2 weeks by your PCP.   I would recommend increasing your  Omeprazole from 20 mg daily to 40 mg daily given your pain may be related to an ulcer vs increased acid in your stomach.   Follow up with your PCP this week for further eval of your pain. Return to the ED IMMEDIATELY for any new/worsening symptoms       Eustaquio Maize, Hershal Coria 04/08/21 1704    Davonna Belling, MD 04/08/21 [phone]

## 2021-04-08 NOTE — Discharge Instructions (Addendum)
Please pick up potassium supplements and take as prescribed over the next week. You will need to have your potassium level rechecked in 1-2 weeks by your PCP.   I would recommend increasing your Omeprazole from 20 mg daily to 40 mg daily given your pain may be related to an ulcer vs increased acid in your stomach.   Follow up with your PCP this week for further eval of your pain. Return to the ED IMMEDIATELY for any new/worsening symptoms

## 2021-04-08 NOTE — ED Notes (Signed)
Pt unable to tolerate potassium IV, provider aware.

## 2021-04-08 NOTE — ED Notes (Signed)
Pt dc home . Stated understanding of dc instructions.

## 2021-04-10 ENCOUNTER — Encounter: Payer: Self-pay | Admitting: Internal Medicine

## 2021-04-11 ENCOUNTER — Inpatient Hospital Stay: Payer: BC Managed Care – PPO

## 2021-04-11 DIAGNOSIS — D693 Immune thrombocytopenic purpura: Secondary | ICD-10-CM

## 2021-04-11 LAB — CBC WITH DIFFERENTIAL/PLATELET
Abs Immature Granulocytes: 0.02 10*3/uL (ref 0.00–0.07)
Basophils Absolute: 0.1 10*3/uL (ref 0.0–0.1)
Basophils Relative: 1 %
Eosinophils Absolute: 0.3 10*3/uL (ref 0.0–0.5)
Eosinophils Relative: 3 %
HCT: 45.2 % (ref 36.0–46.0)
Hemoglobin: 15.3 g/dL — ABNORMAL HIGH (ref 12.0–15.0)
Immature Granulocytes: 0 %
Lymphocytes Relative: 27 %
Lymphs Abs: 2.2 10*3/uL (ref 0.7–4.0)
MCH: 34 pg (ref 26.0–34.0)
MCHC: 33.8 g/dL (ref 30.0–36.0)
MCV: 100.4 fL — ABNORMAL HIGH (ref 80.0–100.0)
Monocytes Absolute: 0.7 10*3/uL (ref 0.1–1.0)
Monocytes Relative: 8 %
Neutro Abs: 5.1 10*3/uL (ref 1.7–7.7)
Neutrophils Relative %: 61 %
Platelets: 37 10*3/uL — ABNORMAL LOW (ref 150–400)
RBC: 4.5 MIL/uL (ref 3.87–5.11)
RDW: 12.3 % (ref 11.5–15.5)
Smear Review: DECREASED
WBC: 8.5 10*3/uL (ref 4.0–10.5)
nRBC: 0 % (ref 0.0–0.2)

## 2021-04-11 MED ORDER — ROMIPLOSTIM INJECTION 500 MCG
3.0000 ug/kg | Freq: Once | SUBCUTANEOUS | Status: AC
  Administered 2021-04-11: 265 ug via SUBCUTANEOUS
  Filled 2021-04-11: qty 0.53

## 2021-04-17 ENCOUNTER — Telehealth: Payer: BC Managed Care – PPO | Admitting: Family Medicine

## 2021-04-17 DIAGNOSIS — H9202 Otalgia, left ear: Secondary | ICD-10-CM

## 2021-04-17 MED ORDER — AMOXICILLIN-POT CLAVULANATE 875-125 MG PO TABS: 1.0000 | ORAL_TABLET | Freq: Two times a day (BID) | ORAL | 0 refills | Status: AC

## 2021-04-17 NOTE — Progress Notes (Signed)
E Visit for Swimmer's Ear  We are sorry that you are not feeling well. Here is how we plan to help!  Based on what you have told me you may have a bacterial infection. I have prescribe dan oral antibiotic: Augmentin to take twice daily by mouth for 5 days.  In certain cases swimmer's ear may progress to a more serious bacterial infection of the middle or inner ear.  If you have a fever 102 and up and significantly worsening symptoms, this could indicate a more serious infection moving to the middle/inner and needs face to face evaluation in an office by a provider.  Your symptoms should improve over the next 3 days and should resolve in about 7 days.  HOME CARE:  Wash your hands frequently. Do not place the tip of the bottle on your ear or touch it with your fingers. You can take Acetominophen 650 mg every 4-6 hours as needed for pain.  If pain is severe or moderate, you can apply a heating pad (set on low) or hot water bottle (wrapped in a towel) to outer ear for 20 minutes.  This will also increase drainage. Avoid ear plugs Do not use Q-tips After showers, help the water run out by tilting your head to one side.  GET HELP RIGHT AWAY IF:  Fever is over 102.2 degrees. You develop progressive ear pain or hearing loss. Ear symptoms persist longer than 3 days after treatment.  MAKE SURE YOU:  Understand these instructions. Will watch your condition. Will get help right away if you are not doing well or get worse.  TO PREVENT SWIMMER'S EAR: Use a bathing cap or custom fitted swim molds to keep your ears dry. Towel off after swimming to dry your ears. Tilt your head or pull your earlobes to allow the water to escape your ear canal. If there is still water in your ears, consider using a hairdryer on the lowest setting.   Thank you for choosing an e-visit.  Your e-visit answers were reviewed by a board certified advanced clinical practitioner to complete your personal care plan.  Depending upon the condition, your plan could have included both over the counter or prescription medications.  Please review your pharmacy choice. Make sure the pharmacy is open so you can pick up prescription now. If there is a problem, you may contact your provider through [employer] and have the prescription routed to another pharmacy.  Your safety is important to [place]. If you have drug allergies check your prescription carefully.   For the next 24 hours you can use MyChart to ask questions about today's visit, request a non-urgent call back, or ask for a work or school excuse. You will get an email in the next two days asking about your experience. I hope that your e-visit has been valuable and will speed your recovery.   I provided 5 minutes of non face-to-face time during this encounter for chart review, medication and order placement, as well as and documentation.

## 2021-04-20 ENCOUNTER — Ambulatory Visit: Payer: BC Managed Care – PPO | Admitting: Pulmonary Disease

## 2021-04-27 ENCOUNTER — Telehealth: Payer: BC Managed Care – PPO | Admitting: Physician Assistant

## 2021-04-27 DIAGNOSIS — J069 Acute upper respiratory infection, unspecified: Secondary | ICD-10-CM

## 2021-04-27 MED ORDER — PREDNISONE 20 MG PO TABS: 40.0000 mg | ORAL_TABLET | Freq: Every day | ORAL | 0 refills | Status: DC

## 2021-04-27 MED ORDER — BENZONATATE 100 MG PO CAPS
100.0000 mg | ORAL_CAPSULE | Freq: Three times a day (TID) | ORAL | 0 refills | Status: DC | PRN
Start: 2021-04-27 — End: 2021-05-31

## 2021-04-27 MED ORDER — FLUTICASONE PROPIONATE 50 MCG/ACT NA SUSP: 2.0000 | Freq: Every day | NASAL | 0 refills | Status: DC

## 2021-04-27 NOTE — Progress Notes (Signed)
E-Visit for Upper Respiratory Infection   We are sorry you are not feeling well.  Here is how we plan to help!  Based on what you have shared with me, it looks like you may have a viral upper respiratory infection.  Upper respiratory infections are caused by a large number of viruses; however, rhinovirus is the most common cause.   Symptoms vary from person to person, with common symptoms including sore throat, cough, fatigue or lack of energy and feeling of general discomfort.  A low-grade fever of up to 100.4 may present, but is often uncommon.  Symptoms vary however, and are closely related to a person's age or underlying illnesses.  The most common symptoms associated with an upper respiratory infection are nasal discharge or congestion, cough, sneezing, headache and pressure in the ears and face.  These symptoms usually persist for about 3 to 10 days, but can last up to 2 weeks.  It is important to know that upper respiratory infections do not cause serious illness or complications in most cases.    Upper respiratory infections can be transmitted from person to person, with the most common method of transmission being a person's hands.  The virus is able to live on the skin and can infect other persons for up to 2 hours after direct contact.  Also, these can be transmitted when someone coughs or sneezes; thus, it is important to cover the mouth to reduce this risk.  To keep the spread of the illness at [place], good hand hygiene is very important.  This is an infection that is most likely caused by a virus. There are no specific treatments other than to help you with the symptoms until the infection runs its course.  We are sorry you are not feeling well.  Here is how we plan to help!   For nasal congestion, you may use an oral decongestants such as Mucinex D or if you have glaucoma or high blood pressure use plain Mucinex.  Saline nasal spray or nasal drops can help and can safely be used as often as  needed for congestion.  For your congestion, I have prescribed Fluticasone nasal spray one spray in each nostril twice a day  If you do not have a history of heart disease, hypertension, diabetes or thyroid disease, prostate/bladder issues or glaucoma, you may also use Sudafed to treat nasal congestion.  It is highly recommended that you consult with a pharmacist or your primary care physician to ensure this medication is safe for you to take.     If you have a cough, you may use cough suppressants such as Delsym and Robitussin.  If you have glaucoma or high blood pressure, you can also use Coricidin HBP.   For cough I have prescribed for you A prescription cough medication called Tessalon Perles 100 mg. You may take 1-2 capsules every 8 hours as needed for cough  Giving COPD and risk for exacerbation, I will add-on a prednisone burst for 5 days to take as directed.   If you have a sore or scratchy throat, use a saltwater gargle-  to  teaspoon of salt dissolved in a 4-ounce to 8-ounce glass of warm water.  Gargle the solution for approximately 15-30 seconds and then spit.  It is important not to swallow the solution.  You can also use throat lozenges/cough drops and Chloraseptic spray to help with throat pain or discomfort.  Warm or cold liquids can also be helpful in relieving throat  pain.  For headache, pain or general discomfort, you can use Ibuprofen or Tylenol as directed.   Some authorities believe that zinc sprays or the use of Echinacea may shorten the course of your symptoms.   HOME CARE Only take medications as instructed by your medical team. Be sure to drink plenty of fluids. Water is fine as well as fruit juices, sodas and electrolyte beverages. You may want to stay away from caffeine or alcohol. If you are nauseated, try taking small sips of liquids. How do you know if you are getting enough fluid? Your urine should be a pale yellow or almost colorless. Get rest. Taking a steamy  shower or using a humidifier may help nasal congestion and ease sore throat pain. You can place a towel over your head and breathe in the steam from hot water coming from a faucet. Using a saline nasal spray works much the same way. Cough drops, hard candies and sore throat lozenges may ease your cough. Avoid close contacts especially the very young and the elderly Cover your mouth if you cough or sneeze Always remember to wash your hands.   GET HELP RIGHT AWAY IF: You develop worsening fever. If your symptoms do not improve within 10 days You develop yellow or green discharge from your nose over 3 days. You have coughing fits You develop a severe head ache or visual changes. You develop shortness of breath, difficulty breathing or start having chest pain Your symptoms persist after you have completed your treatment plan  MAKE SURE YOU  Understand these instructions. Will watch your condition. Will get help right away if you are not doing well or get worse.  Thank you for choosing an e-visit.  Your e-visit answers were reviewed by a board certified advanced clinical practitioner to complete your personal care plan. Depending upon the condition, your plan could have included both over the counter or prescription medications.  Please review your pharmacy choice. Make sure the pharmacy is open so you can pick up prescription now. If there is a problem, you may contact your provider through [employer] and have the prescription routed to another pharmacy.  Your safety is important to [place]. If you have drug allergies check your prescription carefully.   For the next 24 hours you can use MyChart to ask questions about today's visit, request a non-urgent call back, or ask for a work or school excuse. You will get an email in the next two days asking about your experience. I hope that your e-visit has been valuable and will speed your recovery.

## 2021-04-27 NOTE — Progress Notes (Signed)
I have spent 5 minutes in review of e-visit questionnaire, review and updating patient chart, medical decision making and response to patient.   Quetzally Callas Cody Leldon Steege, PA-C    

## 2021-05-01 ENCOUNTER — Other Ambulatory Visit: Payer: Self-pay | Admitting: Internal Medicine

## 2021-05-01 DIAGNOSIS — F419 Anxiety disorder, unspecified: Secondary | ICD-10-CM

## 2021-05-01 DIAGNOSIS — R109 Unspecified abdominal pain: Secondary | ICD-10-CM

## 2021-05-01 DIAGNOSIS — F32A Anxiety disorder, unspecified: Secondary | ICD-10-CM

## 2021-05-02 ENCOUNTER — Other Ambulatory Visit: Payer: Self-pay

## 2021-05-02 ENCOUNTER — Inpatient Hospital Stay: Payer: BC Managed Care – PPO | Attending: Oncology

## 2021-05-02 ENCOUNTER — Inpatient Hospital Stay: Payer: BC Managed Care – PPO

## 2021-05-02 VITALS — Wt 195.0 lb

## 2021-05-02 DIAGNOSIS — D693 Immune thrombocytopenic purpura: Secondary | ICD-10-CM | POA: Diagnosis not present

## 2021-05-02 LAB — CBC WITH DIFFERENTIAL/PLATELET
Abs Immature Granulocytes: 0.01 10*3/uL (ref 0.00–0.07)
Basophils Absolute: 0.1 10*3/uL (ref 0.0–0.1)
Basophils Relative: 1 %
Eosinophils Absolute: 0.2 10*3/uL (ref 0.0–0.5)
Eosinophils Relative: 3 %
HCT: 47.4 % — ABNORMAL HIGH (ref 36.0–46.0)
Hemoglobin: 16 g/dL — ABNORMAL HIGH (ref 12.0–15.0)
Immature Granulocytes: 0 %
Lymphocytes Relative: 29 %
Lymphs Abs: 2.6 10*3/uL (ref 0.7–4.0)
MCH: 33.7 pg (ref 26.0–34.0)
MCHC: 33.8 g/dL (ref 30.0–36.0)
MCV: 99.8 fL (ref 80.0–100.0)
Monocytes Absolute: 0.8 10*3/uL (ref 0.1–1.0)
Monocytes Relative: 9 %
Neutro Abs: 5.3 10*3/uL (ref 1.7–7.7)
Neutrophils Relative %: 58 %
Platelets: 56 10*3/uL — ABNORMAL LOW (ref 150–400)
RBC: 4.75 MIL/uL (ref 3.87–5.11)
RDW: 11.9 % (ref 11.5–15.5)
WBC: 9 10*3/uL (ref 4.0–10.5)
nRBC: 0 % (ref 0.0–0.2)

## 2021-05-02 MED ORDER — ROMIPLOSTIM INJECTION 500 MCG
3.0000 ug/kg | Freq: Once | SUBCUTANEOUS | Status: AC
  Administered 2021-05-02: 265 ug via SUBCUTANEOUS
  Filled 2021-05-02: qty 0.53

## 2021-05-03 ENCOUNTER — Telehealth (INDEPENDENT_AMBULATORY_CARE_PROVIDER_SITE_OTHER): Payer: BC Managed Care – PPO | Admitting: Internal Medicine

## 2021-05-03 ENCOUNTER — Encounter: Payer: Self-pay | Admitting: Internal Medicine

## 2021-05-03 VITALS — BP 112/73 | Ht 62.01 in | Wt 195.0 lb

## 2021-05-03 DIAGNOSIS — M898X9 Other specified disorders of bone, unspecified site: Secondary | ICD-10-CM

## 2021-05-03 DIAGNOSIS — Z113 Encounter for screening for infections with a predominantly sexual mode of transmission: Secondary | ICD-10-CM | POA: Diagnosis not present

## 2021-05-03 DIAGNOSIS — M2550 Pain in unspecified joint: Secondary | ICD-10-CM

## 2021-05-03 DIAGNOSIS — Z13818 Encounter for screening for other digestive system disorders: Secondary | ICD-10-CM | POA: Diagnosis not present

## 2021-05-03 DIAGNOSIS — R053 Chronic cough: Secondary | ICD-10-CM | POA: Diagnosis not present

## 2021-05-03 DIAGNOSIS — M7918 Myalgia, other site: Secondary | ICD-10-CM

## 2021-05-03 DIAGNOSIS — M791 Myalgia, unspecified site: Secondary | ICD-10-CM

## 2021-05-03 DIAGNOSIS — R748 Abnormal levels of other serum enzymes: Secondary | ICD-10-CM

## 2021-05-03 DIAGNOSIS — R5383 Other fatigue: Secondary | ICD-10-CM

## 2021-05-03 DIAGNOSIS — D696 Thrombocytopenia, unspecified: Secondary | ICD-10-CM

## 2021-05-03 DIAGNOSIS — E538 Deficiency of other specified B group vitamins: Secondary | ICD-10-CM

## 2021-05-03 DIAGNOSIS — E610 Copper deficiency: Secondary | ICD-10-CM

## 2021-05-03 MED ORDER — TRAMADOL HCL 50 MG PO TABS: 50.0000 mg | ORAL_TABLET | Freq: Two times a day (BID) | ORAL | 0 refills | Status: DC | PRN

## 2021-05-03 NOTE — Progress Notes (Deleted)
Chief Complaint  Patient presents with   Generalized Body Aches    Pt c/o fatigue and body aches x5-24months. Pt was given Tramadol from hematologist   HPI ROS Past Medical History:  Diagnosis Date   Anxiety    Chicken pox    Depression    Elevated testosterone level in female    Emphysema of lung ([place])    bronchitis   Frequent headaches    Hay fever    Hypertension    Idiopathic thrombocytopenic purpura (ITP) (HCC)    Positive ANA (antinuclear antibody)    Past Surgical History:  Procedure Laterality Date   HEMATOMA EVACUATION N/A 04/07/2020   Procedure: EVACUATION HEMATOMA  AND APPLICATION OF PRESSURE DRESSING;  Surgeon: Benjaman Kindler, MD;  Location: [facility] ORS;  Service: Gynecology;  Laterality: N/A;   HYSTEROSCOPY WITH NOVASURE N/A 02/21/2018   Procedure: HYSTEROSCOPY WITH NOVASURE, POLYPECTOMY;  Surgeon: Benjaman Kindler, MD;  Location: [facility] ORS;  Service: Gynecology;  Laterality: N/A;   TUBAL LIGATION  1997   TUBAL LIGATION     Family History  Problem Relation Age of Onset   Hyperlipidemia Mother    Heart disease Mother    Stroke Mother    Depression Mother    Mental illness Mother    Heart attack Mother 77   Hyperlipidemia Father    Depression Father    Mental illness Father    Diabetes Paternal Grandfather    Cancer Cousin    Social History   Socioeconomic History   Marital status: Married    Spouse name: Not on file   Number of children: Not on file   Years of education: Not on file   Highest education level: Not on file  Occupational History   Not on file  Tobacco Use   Smoking status: Every Day    Packs/day: 1.50    Years: 28.00    Pack years: 42.00    Types: Cigarettes   Smokeless tobacco: Never  Vaping Use   Vaping Use: Never used  Substance and Sexual Activity   Alcohol use: Yes    Alcohol/week: 8.0 standard drinks    Types: 8 Standard drinks or equivalent per week    Comment: Beer (Can)    Drug use: No    Comment: CBD oil   Sexual  activity: Yes    Partners: Male  Other Topics Concern   Not on file  Social History Narrative   44 Year college    Works in a [employer]   Married to [place]    Enjoys coloring    Pets: Dog lives inside   Children: 20 Son 54 Son   Social Determinants of Radio broadcast assistant Strain: Not on file  Food Insecurity: Not on file  Transportation Needs: Not on file  Physical Activity: Not on file  Stress: Not on file  Social Connections: Not on file  Intimate Partner Violence: Not on file   Current Meds  Medication Sig   acetaminophen (TYLENOL) 500 MG tablet Take 500 mg by mouth every 6 (six) hours as needed.   albuterol (VENTOLIN HFA) 108 (90 Base) MCG/ACT inhaler Inhale 2 puffs into the lungs every 6 (six) hours as needed for wheezing or shortness of breath (cough).   ALPRAZolam (XANAX) 0.25 MG tablet TAKE 1 TABLET BY MOUTH EVERY DAY AS NEEDED FOR ANXIETY   amLODipine (NORVASC) 10 MG tablet Take 1 tablet (10 mg total) by mouth daily.   benzonatate (TESSALON) 100 MG  capsule Take 1 capsule (100 mg total) by mouth 3 (three) times daily as needed for cough.   carvedilol (COREG) 25 MG tablet Take 1 tablet (25 mg total) by mouth 2 (two) times daily with a meal.   cetirizine (ZYRTEC) 10 MG tablet Take 1 tablet (10 mg total) by mouth daily.   Cholecalciferol 1.25 MG (50000 UT) capsule Take 1 capsule (50,000 Units total) by mouth every 30 (thirty) days. 1x per month note this   cyanocobalamin (,VITAMIN B-12,) 1000 MCG/ML injection Inject 1 mL (1,000 mcg total) into the muscle every 30 (thirty) days.   diclofenac Sodium (VOLTAREN) 1 % GEL Apply 2-4 g topically 4 (four) times daily. 2 grams upper body qid prn and 4 gram lower body qid prn   dicyclomine (BENTYL) 10 MG capsule TAKE 1 CAPSULE (10 MG TOTAL) BY MOUTH 3 (THREE) TIMES DAILY AS NEEDED FOR SPASMS. BEFORE FOOD   escitalopram (LEXAPRO) 20 MG tablet TAKE 1 TABLET BY MOUTH EVERY DAY   fluticasone (FLONASE) 50 MCG/ACT  nasal spray Place 2 sprays into both nostrils daily.   Fluticasone-Umeclidin-Vilant (TRELEGY ELLIPTA) 200-62.5-25 MCG/INH AEPB Inhale 1 puff into the lungs daily.   ibuprofen (ADVIL) 600 MG tablet Take 600 mg by mouth every 6 (six) hours as needed.   naproxen (NAPROSYN) 500 MG tablet Take 1 tablet (500 mg total) by mouth 2 (two) times daily with a meal.   omeprazole (PRILOSEC) 20 MG capsule Take 1 capsule (20 mg total) by mouth daily.   ondansetron (ZOFRAN ODT) 4 MG disintegrating tablet Take 1 tablet (4 mg total) by mouth every 8 (eight) hours as needed for nausea or vomiting.   Syringe/Needle, Disp, 25G X 1-1/2" 1 ML MISC 1 Device by Does not apply route as directed.   traMADol (ULTRAM) 50 MG tablet Take 1-2 tablets (50-100 mg total) by mouth every 6 (six) hours as needed.   traZODone (DESYREL) 50 MG tablet Take 0.5-1 tablets (25-50 mg total) by mouth at bedtime as needed for sleep.   Allergies  Allergen Reactions   Meloxicam Nausea Only   Recent Results (from the past 2160 hour(s))  CBC with Differential/Platelet     Status: Abnormal   Collection Time: 02/21/21 12:59 PM  Result Value Ref Range   WBC 8.0 4.0 - 10.5 K/uL   RBC 4.56 3.87 - 5.11 MIL/uL   Hemoglobin 15.6 (H) 12.0 - 15.0 g/dL   HCT 45.7 36.0 - 46.0 %   MCV 100.2 (H) 80.0 - 100.0 fL   MCH 34.2 (H) 26.0 - 34.0 pg   MCHC 34.1 30.0 - 36.0 g/dL   RDW 13.2 11.5 - 15.5 %   Platelets 40 (L) 150 - 400 K/uL    Comment: SPECIMEN CHECKED FOR CLOTS Immature Platelet Fraction may be clinically indicated, consider ordering this additional test [id number]    nRBC 0.0 0.0 - 0.2 %   Neutrophils Relative % 52 %   Neutro Abs 4.2 1.7 - 7.7 K/uL   Lymphocytes Relative 32 %   Lymphs Abs 2.5 0.7 - 4.0 K/uL   Monocytes Relative 11 %   Monocytes Absolute 0.9 0.1 - 1.0 K/uL   Eosinophils Relative 4 %   Eosinophils Absolute 0.3 0.0 - 0.5 K/uL   Basophils Relative 1 %   Basophils Absolute 0.1 0.0 - 0.1 K/uL   Immature Granulocytes 0 %    Abs Immature Granulocytes 0.02 0.00 - 0.07 K/uL    Comment: Performed at [facility], [address] [place], [place]  [address]  CBC with Differential/Platelet     Status: Abnormal   Collection Time: 03/21/21  1:01 PM  Result Value Ref Range   WBC 8.8 4.0 - 10.5 K/uL   RBC 4.51 3.87 - 5.11 MIL/uL   Hemoglobin 15.6 (H) 12.0 - 15.0 g/dL   HCT 45.1 36.0 - 46.0 %   MCV 100.0 80.0 - 100.0 fL   MCH 34.6 (H) 26.0 - 34.0 pg   MCHC 34.6 30.0 - 36.0 g/dL   RDW 12.7 11.5 - 15.5 %   Platelets 34 (L) 150 - 400 K/uL    Comment: SPECIMEN CHECKED FOR CLOTS Immature Platelet Fraction may be clinically indicated, consider ordering this additional test [id number]    nRBC 0.0 0.0 - 0.2 %   Neutrophils Relative % 63 %   Neutro Abs 5.5 1.7 - 7.7 K/uL   Lymphocytes Relative 25 %   Lymphs Abs 2.2 0.7 - 4.0 K/uL   Monocytes Relative 9 %   Monocytes Absolute 0.8 0.1 - 1.0 K/uL   Eosinophils Relative 2 %   Eosinophils Absolute 0.2 0.0 - 0.5 K/uL   Basophils Relative 1 %   Basophils Absolute 0.1 0.0 - 0.1 K/uL   Immature Granulocytes 0 %   Abs Immature Granulocytes 0.02 0.00 - 0.07 K/uL    Comment: Performed at [facility], [address] [place], [place] [address]  Comprehensive metabolic panel     Status: Abnormal   Collection Time: 04/08/21  2:21 PM  Result Value Ref Range   Sodium 140 135 - 145 mmol/L   Potassium 3.0 (L) 3.5 - 5.1 mmol/L   Chloride 104 98 - 111 mmol/L   CO2 25 22 - 32 mmol/L   Glucose, Bld 99 70 - 99 mg/dL    Comment: Glucose reference range applies only to samples taken after fasting for at least 8 hours.   BUN <5 (L) 6 - 20 mg/dL   Creatinine, Ser 0.49 0.44 - 1.00 mg/dL   Calcium 8.9 8.9 - 10.3 mg/dL   Total Protein 6.3 (L) 6.5 - 8.1 g/dL   Albumin 3.8 3.5 - 5.0 g/dL   AST 51 (H) 15 - 41 U/L   ALT 23 0 - 44 U/L   Alkaline Phosphatase 70 38 - 126 U/L   Total Bilirubin 0.6 0.3 - 1.2 mg/dL   GFR, Estimated >60 >60 mL/min    Comment: (NOTE) Calculated  using the CKD-EPI Creatinine Equation (2021)    Anion gap 11 5 - 15    Comment: Performed at [facility], [place], [place] [address]  Lipase, blood     Status: None   Collection Time: 04/08/21  2:21 PM  Result Value Ref Range   Lipase 23 11 - 51 U/L    Comment: Performed at [facility], [address], [place],  [address]  CBC with Differential     Status: Abnormal   Collection Time: 04/08/21  2:21 PM  Result Value Ref Range   WBC 7.6 4.0 - 10.5 K/uL   RBC 4.52 3.87 - 5.11 MIL/uL   Hemoglobin 15.3 (H) 12.0 - 15.0 g/dL   HCT 44.8 36.0 - 46.0 %   MCV 99.1 80.0 - 100.0 fL   MCH 33.8 26.0 - 34.0 pg   MCHC 34.2 30.0 - 36.0 g/dL   RDW 12.7 11.5 - 15.5 %   Platelets 44 (L) 150 - 400 K/uL    Comment: SPECIMEN CHECKED FOR CLOTS Immature Platelet Fraction may  be clinically indicated, consider ordering this additional test [id number] REPEATED TO VERIFY PLATELET COUNT CONFIRMED BY SMEAR    nRBC 0.0 0.0 - 0.2 %   Neutrophils Relative % 61 %   Neutro Abs 4.7 1.7 - 7.7 K/uL   Lymphocytes Relative 25 %   Lymphs Abs 1.9 0.7 - 4.0 K/uL   Monocytes Relative 10 %   Monocytes Absolute 0.7 0.1 - 1.0 K/uL   Eosinophils Relative 3 %   Eosinophils Absolute 0.2 0.0 - 0.5 K/uL   Basophils Relative 1 %   Basophils Absolute 0.1 0.0 - 0.1 K/uL   Immature Granulocytes 0 %   Abs Immature Granulocytes 0.02 0.00 - 0.07 K/uL   Giant PLTs PRESENT     Comment: Performed at [facility], [place], [place] [address]  CBC with Differential/Platelet     Status: Abnormal   Collection Time: 04/11/21  1:47 PM  Result Value Ref Range   WBC 8.5 4.0 - 10.5 K/uL   RBC 4.50 3.87 - 5.11 MIL/uL   Hemoglobin 15.3 (H) 12.0 - 15.0 g/dL   HCT 45.2 36.0 - 46.0 %   MCV 100.4 (H) 80.0 - 100.0 fL   MCH 34.0 26.0 - 34.0 pg   MCHC 33.8 30.0 - 36.0 g/dL   RDW 12.3 11.5 - 15.5 %   Platelets 37 (L) 150 - 400 K/uL    Comment:  Immature Platelet Fraction may be clinically indicated, consider ordering this additional test [id number]    nRBC 0.0 0.0 - 0.2 %   Neutrophils Relative % 61 %   Neutro Abs 5.1 1.7 - 7.7 K/uL   Lymphocytes Relative 27 %   Lymphs Abs 2.2 0.7 - 4.0 K/uL   Monocytes Relative 8 %   Monocytes Absolute 0.7 0.1 - 1.0 K/uL   Eosinophils Relative 3 %   Eosinophils Absolute 0.3 0.0 - 0.5 K/uL   Basophils Relative 1 %   Basophils Absolute 0.1 0.0 - 0.1 K/uL   WBC Morphology DIFF CONFIRMED BY MANUAL    RBC Morphology UNREMARKABLE    Smear Review PLATELETS APPEAR DECREASED     Comment: PLATELETS VARY IN SIZE   Immature Granulocytes 0 %   Abs Immature Granulocytes 0.02 0.00 - 0.07 K/uL    Comment: Performed at [facility], [address] [place], [place] [address]  CBC with Differential/Platelet     Status: Abnormal   Collection Time: 05/02/21 12:53 PM  Result Value Ref Range   WBC 9.0 4.0 - 10.5 K/uL   RBC 4.75 3.87 - 5.11 MIL/uL   Hemoglobin 16.0 (H) 12.0 - 15.0 g/dL   HCT 47.4 (H) 36.0 - 46.0 %   MCV 99.8 80.0 - 100.0 fL   MCH 33.7 26.0 - 34.0 pg   MCHC 33.8 30.0 - 36.0 g/dL   RDW 11.9 11.5 - 15.5 %   Platelets 56 (L) 150 - 400 K/uL   nRBC 0.0 0.0 - 0.2 %   Neutrophils Relative % 58 %   Neutro Abs 5.3 1.7 - 7.7 K/uL   Lymphocytes Relative 29 %   Lymphs Abs 2.6 0.7 - 4.0 K/uL   Monocytes Relative 9 %   Monocytes Absolute 0.8 0.1 - 1.0 K/uL   Eosinophils Relative 3 %   Eosinophils Absolute 0.2 0.0 - 0.5 K/uL   Basophils Relative 1 %   Basophils Absolute 0.1 0.0 - 0.1 K/uL   Immature Granulocytes 0 %   Abs Immature Granulocytes 0.01 0.00 - 0.07  K/uL    Comment: Performed at [facility], [address] [place], [place] [address]   Objective  Body mass index is 35.66 kg/m. Wt Readings from Last 3 Encounters:  05/03/21 195 lb (88.5 kg)  05/02/21 195 lb (88.5 kg)  04/08/21 195 lb (88.5 kg)   Temp Readings from Last 3 Encounters:  04/08/21 98.2 F (36.8 C)  (Oral)  03/21/21 98.4 F (36.9 C) (Oral)  01/30/21 98.5 F (36.9 C)   BP Readings from Last 3 Encounters:  05/03/21 112/73  04/08/21 (!) 142/83  03/21/21 114/70   Pulse Readings from Last 3 Encounters:  04/08/21 95  03/21/21 76  01/30/21 84    Physical Exam  Assessment  Plan  No diagnosis found.  HM Flu shot utd, not had 2021  tdap utd due in 2026  covid 19 vaccine consider in future will need to be approved Dr. Grayland Ormond due to chronic ITP   Pap overdue sch Dr. Smitty Knudsen change to Dr. WArd s/p ablation and cervical polyps  -she will call [facility] ob[facility]gyn Dr. Leonides Schanz in [place] and get this scheduled due for pap    mammo 07/20/19  neg ordered 2022 [place]    Colonoscopy consider in future [facility] female address low plts 1st   consider in future vs cologaurd too risk to do colonoscopy with chronic ITP currently    Skin referred [address] ? derm appt sch'ed or had    Provider: Dr. Olivia Mackie McLean-Scocuzza-Internal Medicine

## 2021-05-04 ENCOUNTER — Encounter: Payer: Self-pay | Admitting: Internal Medicine

## 2021-05-04 DIAGNOSIS — Z13818 Encounter for screening for other digestive system disorders: Secondary | ICD-10-CM | POA: Diagnosis not present

## 2021-05-04 DIAGNOSIS — D696 Thrombocytopenia, unspecified: Secondary | ICD-10-CM | POA: Diagnosis not present

## 2021-05-04 DIAGNOSIS — E538 Deficiency of other specified B group vitamins: Secondary | ICD-10-CM | POA: Diagnosis not present

## 2021-05-04 DIAGNOSIS — Z113 Encounter for screening for infections with a predominantly sexual mode of transmission: Secondary | ICD-10-CM | POA: Diagnosis not present

## 2021-05-04 NOTE — Telephone Encounter (Signed)
Placed on your desk. Please advise since Patient was seen yesterday virtually. Patient states she will need the FMLA paperwork to be able to attend the appointments she was referred to

## 2021-05-04 NOTE — Progress Notes (Addendum)
Virtual Visit via Video Note  I connected with Carly [name]  on 04/1921 at  [facility] EDT by a video enabled telemedicine application and verified that I am speaking with the correct person using two identifiers.  Location patient: home, [place] Location provider:work or home office Persons participating in the virtual visit: patient, provider  I discussed the limitations of evaluation and management by telemedicine and the availability of in person appointments. The patient expressed understanding and agreed to proceed.   HPI:  Acute telemedicine visit for : F/u  1. Fatigue, body aches esp legs aching and feet c/o recent dx arthritis in feet and plantar fascitis tramadol 50 mg qd to bid helps when working 8-10 hr shifts and currently out of work due to her sx's  2. Chronic thrombocytopenia f/u Dr. Grayland Ormond in [place] despite tx infusions her platelets as of 05/02/21 still 56K and have trended lower than this  She stopped nsaid use and only drinks etoh 2x per month  3. Chronic smoker with persistent cough  ROS: See pertinent positives and negatives per HPI.  Past Medical History:  Diagnosis Date   Anxiety    Chicken pox    Depression    Elevated testosterone level in female    Emphysema of lung (HCC)    bronchitis   Frequent headaches    Hay fever    Hypertension    Idiopathic thrombocytopenic purpura (ITP) (HCC)    Positive ANA (antinuclear antibody)     Past Surgical History:  Procedure Laterality Date   HEMATOMA EVACUATION N/A 04/07/2020   Procedure: EVACUATION HEMATOMA  AND APPLICATION OF PRESSURE DRESSING;  Surgeon: Benjaman Kindler, MD;  Location: [facility] OR[facility];  Service: Gynecology;  Laterality: N/A;   HYSTEROSCOPY WITH NOVASURE N/A 02/21/2018   Procedure: HYSTEROSCOPY WITH NOVASURE, POLYPECTOMY;  Surgeon: Benjaman Kindler, MD;  Location: [facility] OR[facility];  Service: Gynecology;  Laterality: N/A;   TUBAL LIGATION  1997   TUBAL LIGATION       Current Outpatient Medications:     acetaminophen (TYLENOL) 500 MG tablet, Take 500 mg by mouth every 6 (six) hours as needed., Disp: , Rfl:    albuterol (VENTOLIN HFA) 108 (90 Base) MCG/ACT inhaler, Inhale 2 puffs into the lungs every 6 (six) hours as needed for wheezing or shortness of breath (cough)., Disp: 1 each, Rfl: 0   ALPRAZolam (XANAX) 0.25 MG tablet, TAKE 1 TABLET BY MOUTH EVERY DAY AS NEEDED FOR ANXIETY, Disp: 30 tablet, Rfl: 0   amLODipine (NORVASC) 10 MG tablet, Take 1 tablet (10 mg total) by mouth daily., Disp: 90 tablet, Rfl: 3   benzonatate (TESSALON) 100 MG capsule, Take 1 capsule (100 mg total) by mouth 3 (three) times daily as needed for cough., Disp: 30 capsule, Rfl: 0   carvedilol (COREG) 25 MG tablet, Take 1 tablet (25 mg total) by mouth 2 (two) times daily with a meal., Disp: 180 tablet, Rfl: 3   cetirizine (ZYRTEC) 10 MG tablet, Take 1 tablet (10 mg total) by mouth daily., Disp: 90 tablet, Rfl: 3   Cholecalciferol 1.25 MG (50000 UT) capsule, Take 1 capsule (50,000 Units total) by mouth every 30 (thirty) days. 1x per month note this, Disp: 13 capsule, Rfl: 0   cyanocobalamin (,VITAMIN B-12,) 1000 MCG/ML injection, Inject 1 mL (1,000 mcg total) into the muscle every 30 (thirty) days., Disp: 8 mL, Rfl: 1   diclofenac Sodium (VOLTAREN) 1 % GEL, Apply 2-4 g topically 4 (four) times daily. 2 grams upper body qid prn and 4 gram  lower body qid prn, Disp: 150 g, Rfl: 11   dicyclomine (BENTYL) 10 MG capsule, TAKE 1 CAPSULE (10 MG TOTAL) BY MOUTH 3 (THREE) TIMES DAILY AS NEEDED FOR SPASMS. BEFORE FOOD, Disp: 90 capsule, Rfl: 11   escitalopram (LEXAPRO) 20 MG tablet, TAKE 1 TABLET BY MOUTH EVERY DAY, Disp: 90 tablet, Rfl: 3   fluticasone (FLONASE) 50 MCG/ACT nasal spray, Place 2 sprays into both nostrils daily., Disp: 16 g, Rfl: 0   Fluticasone-Umeclidin-Vilant (TRELEGY ELLIPTA) 200-62.5-25 MCG/INH AEPB, Inhale 1 puff into the lungs daily., Disp: 28 each, Rfl: 11   ibuprofen (ADVIL) 600 MG tablet, Take 600 mg by mouth every  6 (six) hours as needed., Disp: , Rfl:    naproxen (NAPROSYN) 500 MG tablet, Take 1 tablet (500 mg total) by mouth 2 (two) times daily with a meal., Disp: 20 tablet, Rfl: 0   omeprazole (PRILOSEC) 20 MG capsule, Take 1 capsule (20 mg total) by mouth daily., Disp: 90 capsule, Rfl: 3   ondansetron (ZOFRAN ODT) 4 MG disintegrating tablet, Take 1 tablet (4 mg total) by mouth every 8 (eight) hours as needed for nausea or vomiting., Disp: 20 tablet, Rfl: 0   Syringe/Needle, Disp, 25G X 1-1/2" 1 ML MISC, 1 Device by Does not apply route as directed., Disp: 30 each, Rfl: 11   traZODone (DESYREL) 50 MG tablet, Take 0.5-1 tablets (25-50 mg total) by mouth at bedtime as needed for sleep., Disp: 90 tablet, Rfl: 3   cyclobenzaprine (FLEXERIL) 10 MG tablet, Take 1 tablet (10 mg total) by mouth 3 (three) times daily as needed for muscle spasms. (Patient not taking: Reported on 05/03/2021), Disp: 15 tablet, Rfl: 0   mirabegron ER (MYRBETRIQ) 25 MG TB24 tablet, Take 1 tablet (25 mg total) by mouth daily. (Patient not taking: Reported on 05/03/2021), Disp: 90 tablet, Rfl: 3   ondansetron (ZOFRAN) 4 MG tablet, Take by mouth. (Patient not taking: Reported on 05/03/2021), Disp: , Rfl:    potassium chloride (KLOR-CON) 10 MEQ tablet, Take 1 tablet (10 mEq total) by mouth daily for 5 days., Disp: 5 tablet, Rfl: 0   predniSONE (DELTASONE) 20 MG tablet, Take 2 tablets (40 mg total) by mouth daily with breakfast. (Patient not taking: Reported on 05/03/2021), Disp: 10 tablet, Rfl: 0   spironolactone (ALDACTONE) 25 MG tablet, Take 1 tablet (25 mg total) by mouth daily. In am (Patient not taking: Reported on 05/03/2021), Disp: 90 tablet, Rfl: 3   traMADol (ULTRAM) 50 MG tablet, Take 1 tablet (50 mg total) by mouth every 12 (twelve) hours as needed., Disp: 60 tablet, Rfl: 0  EXAM:  VITALS per patient if applicable:  GENERAL: alert, oriented, appears well and in no acute distress  HEENT: atraumatic, conjunttiva clear, no  obvious abnormalities on inspection of external nose and ears  NECK: normal movements of the head and neck  LUNGS: on inspection no signs of respiratory distress, breathing rate appears normal, no obvious gross SOB, gasping or wheezing  CV: no obvious cyanosis  MS: moves all visible extremities without noticeable abnormality  PSYCH/NEURO: pleasant and cooperative, no obvious depression or anxiety, speech and thought processing grossly intact  ASSESSMENT AND PLAN:  Discussed the following assessment and plan:  Persistent cough - Plan: CT Chest Wo Contrast  Bone pain vs MSK pain - Plan: traMADol (ULTRAM) 50 MG tablet, Sedimentation rate, Antinuclear Antib (ANA), Rheumatoid Factor, Protein Electrophoresis, (serum), Protein Electrophoresis, Urine Rflx., CYCLIC CITRUL PEPTIDE ANTIBODY, IGG/IGA [facility] pain clinic denied referral and wants ortho to see  1st then may have to re-refer  [facility]erred [facility]   Venereal disease screening - Plan: Hepatitis C antibody, HIV antibody (with reflex) Encounter for screening for other digestive system disorders - Plan: Hepatitis C antibody, HIV antibody (with reflex)  Thrombocytopenia ([place]) - Plan: Hepatitis C antibody, HIV antibody (with reflex), Vitamin B12, Copper, Serum, Folate, Pathologist smear review, Ambulatory referral to Hematology / Oncology B12 deficiency - Plan: Vitamin B12 Copper deficiency - Plan: Copper, Serum Folate deficiency - Plan: Folate  Myalgia - Plan: Sedimentation rate, Antinuclear Antib (ANA), Rheumatoid Factor, CYCLIC [place]RUL PEPTIDE ANTIBODY, IGG/IGA, Comprehensive metabolic panel, Magnesium  Elevated liver enzymes - Plan: Comprehensive metabolic panel, Magnesium  Hypomagnesemia - Plan: Comprehensive metabolic panel, Magnesium  Fatigue, r/o malignancy sending to [facility] h/o to see if further w/u can be done with low plts pending CT chest   HM See last note  -we discussed possible serious and likely etiologies, options for  evaluation and workup, limitations of telemedicine visit vs in person visit, treatment, treatment risks and precautions. Pt is agreeable to treatment via telemedicine at this moment.     I discussed the assessment and treatment plan with the patient. The patient was provided an opportunity to ask questions and all were answered. The patient agreed with the plan and demonstrated an understanding of the instructions.    Time spent 20 minutes  Delorise Jackson, MD

## 2021-05-05 ENCOUNTER — Encounter: Payer: Self-pay | Admitting: Oncology

## 2021-05-06 ENCOUNTER — Encounter: Payer: Self-pay | Admitting: Oncology

## 2021-05-07 ENCOUNTER — Encounter: Payer: Self-pay | Admitting: Oncology

## 2021-05-08 LAB — PATHOLOGIST SMEAR REVIEW
Basophils Absolute: 0 10*3/uL (ref 0.0–0.2)
Basos: 0 %
EOS (ABSOLUTE): 0 10*3/uL (ref 0.0–0.4)
Eos: 0 %
Hematocrit: 46.3 % (ref 34.0–46.6)
Hemoglobin: 16.2 g/dL — ABNORMAL HIGH (ref 11.1–15.9)
Immature Grans (Abs): 0 10*3/uL (ref 0.0–0.1)
Immature Granulocytes: 0 %
Lymphocytes Absolute: 2 10*3/uL (ref 0.7–3.1)
Lymphs: 18 %
MCH: 34 pg — ABNORMAL HIGH (ref 26.6–33.0)
MCHC: 35 g/dL (ref 31.5–35.7)
MCV: 97 fL (ref 79–97)
Monocytes Absolute: 0.9 10*3/uL (ref 0.1–0.9)
Monocytes: 8 %
Neutrophils Absolute: 8.5 10*3/uL — ABNORMAL HIGH (ref 1.4–7.0)
Neutrophils: 74 %
Platelets: 47 10*3/uL — CL (ref 150–450)
RBC: 4.76 x10E6/uL (ref 3.77–5.28)
RDW: 11.7 % (ref 11.7–15.4)
WBC: 11.5 10*3/uL — ABNORMAL HIGH (ref 3.4–10.8)

## 2021-05-09 ENCOUNTER — Encounter: Payer: Self-pay | Admitting: Internal Medicine

## 2021-05-09 ENCOUNTER — Encounter: Payer: Self-pay | Admitting: Oncology

## 2021-05-09 LAB — HIV ANTIBODY (ROUTINE TESTING W REFLEX): HIV Screen 4th Generation wRfx: NONREACTIVE

## 2021-05-09 LAB — COMPREHENSIVE METABOLIC PANEL
ALT: 30 IU/L (ref 0–32)
AST: 41 IU/L — ABNORMAL HIGH (ref 0–40)
Albumin/Globulin Ratio: 1.8 (ref 1.2–2.2)
Albumin: 4.5 g/dL (ref 3.8–4.8)
Alkaline Phosphatase: 96 IU/L (ref 44–121)
BUN/Creatinine Ratio: 13 (ref 9–23)
BUN: 8 mg/dL (ref 6–24)
Bilirubin Total: 0.6 mg/dL (ref 0.0–1.2)
CO2: 22 mmol/L (ref 20–29)
Calcium: 9.1 mg/dL (ref 8.7–10.2)
Chloride: 101 mmol/L (ref 96–106)
Creatinine, Ser: 0.61 mg/dL (ref 0.57–1.00)
Globulin, Total: 2.5 g/dL (ref 1.5–4.5)
Glucose: 75 mg/dL (ref 70–99)
Potassium: 3.8 mmol/L (ref 3.5–5.2)
Sodium: 139 mmol/L (ref 134–144)
Total Protein: 7 g/dL (ref 6.0–8.5)
eGFR: 110 mL/min/{1.73_m2} (ref >59)

## 2021-05-09 LAB — PROTEIN ELECTROPHORESIS, SERUM
A/G Ratio: 1.3 (ref 0.7–1.7)
Albumin ELP: 3.9 g/dL (ref 2.9–4.4)
Alpha 1: 0.3 g/dL (ref 0.0–0.4)
Alpha 2: 0.7 g/dL (ref 0.4–1.0)
Beta: 1.3 g/dL (ref 0.7–1.3)
Gamma Globulin: 0.8 g/dL (ref 0.4–1.8)
Globulin, Total: 3.1 g/dL (ref 2.2–3.9)

## 2021-05-09 LAB — PROTEIN ELECTROPHORESIS, URINE REFLEX
Albumin ELP, Urine: 0 %
Alpha-1-Globulin, U: 0 %
Alpha-2-Globulin, U: 0 %
Beta Globulin, U: 0 %
Gamma Globulin, U: 0 %
Protein, Ur: <4 mg/dL

## 2021-05-09 LAB — CYCLIC CITRUL PEPTIDE ANTIBODY, IGG/IGA: Cyclic Citrullin Peptide Ab: 2 units (ref 0–19)

## 2021-05-09 LAB — HEPATITIS C ANTIBODY: Hep C Virus Ab: <0.1 s/co ratio (ref 0.0–0.9)

## 2021-05-09 LAB — COPPER, SERUM: Copper: 101 ug/dL (ref 80–158)

## 2021-05-09 LAB — SEDIMENTATION RATE: Sed Rate: 5 mm/hr (ref 0–32)

## 2021-05-09 LAB — RHEUMATOID FACTOR: Rheumatoid fact SerPl-aCnc: <10 IU/mL (ref <14.0)

## 2021-05-09 LAB — MAGNESIUM: Magnesium: 1.6 mg/dL (ref 1.6–2.3)

## 2021-05-09 LAB — ANA: Anti Nuclear Antibody (ANA): NEGATIVE

## 2021-05-09 LAB — FOLATE: Folate: 4.8 ng/mL (ref >3.0)

## 2021-05-09 LAB — VITAMIN B12: Vitamin B-12: 549 pg/mL (ref 232–1245)

## 2021-05-09 NOTE — Telephone Encounter (Signed)
Please advise, labs are in but not resulted as of yet

## 2021-05-10 NOTE — Telephone Encounter (Signed)
Please advise 

## 2021-05-11 MED ORDER — TRELEGY ELLIPTA 200-62.5-25 MCG/ACT IN AEPB: 1.0000 | INHALATION_SPRAY | Freq: Every day | RESPIRATORY_TRACT | 0 refills | Status: DC

## 2021-05-11 MED ORDER — ALBUTEROL SULFATE HFA 108 (90 BASE) MCG/ACT IN AERS: 2.0000 | INHALATION_SPRAY | Freq: Four times a day (QID) | RESPIRATORY_TRACT | 0 refills | Status: DC | PRN

## 2021-05-11 NOTE — Addendum Note (Signed)
Addended by: Orland Mustard on: 05/11/2021 07:28 AM   Modules accepted: Orders

## 2021-05-12 NOTE — Telephone Encounter (Signed)
Created in error

## 2021-05-12 NOTE — Addendum Note (Signed)
Addended by: Carlisle Cater on: 05/12/2021 09:53 AM   Modules accepted: Orders

## 2021-05-15 ENCOUNTER — Telehealth: Payer: BC Managed Care – PPO | Admitting: Family

## 2021-05-15 ENCOUNTER — Encounter: Payer: Self-pay | Admitting: Internal Medicine

## 2021-05-15 ENCOUNTER — Other Ambulatory Visit: Payer: Self-pay | Admitting: Internal Medicine

## 2021-05-15 DIAGNOSIS — R110 Nausea: Secondary | ICD-10-CM

## 2021-05-15 DIAGNOSIS — R399 Unspecified symptoms and signs involving the genitourinary system: Secondary | ICD-10-CM | POA: Diagnosis not present

## 2021-05-15 MED ORDER — CEPHALEXIN 500 MG PO CAPS: 500.0000 mg | ORAL_CAPSULE | Freq: Two times a day (BID) | ORAL | 0 refills | Status: DC

## 2021-05-15 NOTE — Progress Notes (Signed)

## 2021-05-16 NOTE — Telephone Encounter (Signed)
-----   Message from Delorise Jackson, MD sent at 05/15/2021 12:48 PM EDT ----- [facility] hematology/oncology [phone], [phone] Trying to move appt up sooner left message 10/27 or 05/12/21 and called again 05/15/21 and spoke with Barbaraann Share who will call me back   [facility] for now is referring to [facility] is pt ok with this? Since [facility] is so far behind in referrals?  Barbaraann Share is going to call me back though

## 2021-05-16 NOTE — Addendum Note (Signed)
Addended by: Orland Mustard on: 05/16/2021 08:19 AM   Modules accepted: Orders

## 2021-05-18 ENCOUNTER — Telehealth: Payer: Self-pay

## 2021-05-18 NOTE — Telephone Encounter (Addendum)
Called and LVM to patient in regards to upcoming COVID test on 11/7. Patient had a clear understanding nothing further needed. Routing to triage for follow up.

## 2021-05-18 NOTE — Telephone Encounter (Signed)
Noted by triage.  

## 2021-05-22 ENCOUNTER — Other Ambulatory Visit: Payer: Self-pay

## 2021-05-22 ENCOUNTER — Other Ambulatory Visit
Admission: RE | Admit: 2021-05-22 | Discharge: 2021-05-22 | Disposition: A | Discharge status: Home / Self Care | Payer: BC Managed Care – PPO | Source: Ambulatory Visit | Attending: Pulmonary Disease | Admitting: Pulmonary Disease

## 2021-05-22 DIAGNOSIS — Z01812 Encounter for preprocedural laboratory examination: Secondary | ICD-10-CM | POA: Diagnosis not present

## 2021-05-22 DIAGNOSIS — Z20822 Contact with and (suspected) exposure to covid-19: Secondary | ICD-10-CM | POA: Diagnosis not present

## 2021-05-23 ENCOUNTER — Ambulatory Visit: Payer: BC Managed Care – PPO | Attending: Pulmonary Disease

## 2021-05-23 ENCOUNTER — Inpatient Hospital Stay: Payer: BC Managed Care – PPO

## 2021-05-23 ENCOUNTER — Ambulatory Visit: Payer: BC Managed Care – PPO

## 2021-05-23 ENCOUNTER — Inpatient Hospital Stay: Payer: BC Managed Care – PPO | Attending: Oncology

## 2021-05-23 VITALS — Wt 195.0 lb

## 2021-05-23 DIAGNOSIS — Z7952 Long term (current) use of systemic steroids: Secondary | ICD-10-CM | POA: Diagnosis not present

## 2021-05-23 DIAGNOSIS — F17210 Nicotine dependence, cigarettes, uncomplicated: Secondary | ICD-10-CM | POA: Insufficient documentation

## 2021-05-23 DIAGNOSIS — I10 Essential (primary) hypertension: Secondary | ICD-10-CM | POA: Insufficient documentation

## 2021-05-23 DIAGNOSIS — J439 Emphysema, unspecified: Secondary | ICD-10-CM | POA: Insufficient documentation

## 2021-05-23 DIAGNOSIS — Z79899 Other long term (current) drug therapy: Secondary | ICD-10-CM | POA: Insufficient documentation

## 2021-05-23 DIAGNOSIS — R059 Cough, unspecified: Secondary | ICD-10-CM | POA: Insufficient documentation

## 2021-05-23 DIAGNOSIS — F32A Depression, unspecified: Secondary | ICD-10-CM | POA: Insufficient documentation

## 2021-05-23 DIAGNOSIS — D693 Immune thrombocytopenic purpura: Secondary | ICD-10-CM

## 2021-05-23 DIAGNOSIS — Z7951 Long term (current) use of inhaled steroids: Secondary | ICD-10-CM | POA: Diagnosis not present

## 2021-05-23 DIAGNOSIS — J449 Chronic obstructive pulmonary disease, unspecified: Secondary | ICD-10-CM | POA: Insufficient documentation

## 2021-05-23 DIAGNOSIS — F419 Anxiety disorder, unspecified: Secondary | ICD-10-CM | POA: Diagnosis not present

## 2021-05-23 DIAGNOSIS — R0602 Shortness of breath: Secondary | ICD-10-CM | POA: Diagnosis not present

## 2021-05-23 LAB — CBC WITH DIFFERENTIAL/PLATELET
Abs Immature Granulocytes: 0.02 10*3/uL (ref 0.00–0.07)
Basophils Absolute: 0.1 10*3/uL (ref 0.0–0.1)
Basophils Relative: 1 %
Eosinophils Absolute: 0.2 10*3/uL (ref 0.0–0.5)
Eosinophils Relative: 3 %
HCT: 45.3 % (ref 36.0–46.0)
Hemoglobin: 15.5 g/dL — ABNORMAL HIGH (ref 12.0–15.0)
Immature Granulocytes: 0 %
Lymphocytes Relative: 29 %
Lymphs Abs: 2.3 10*3/uL (ref 0.7–4.0)
MCH: 33.7 pg (ref 26.0–34.0)
MCHC: 34.2 g/dL (ref 30.0–36.0)
MCV: 98.5 fL (ref 80.0–100.0)
Monocytes Absolute: 0.8 10*3/uL (ref 0.1–1.0)
Monocytes Relative: 10 %
Neutro Abs: 4.5 10*3/uL (ref 1.7–7.7)
Neutrophils Relative %: 57 %
Platelets: 47 10*3/uL — ABNORMAL LOW (ref 150–400)
RBC: 4.6 MIL/uL (ref 3.87–5.11)
RDW: 12.4 % (ref 11.5–15.5)
WBC: 7.8 10*3/uL (ref 4.0–10.5)
nRBC: 0 % (ref 0.0–0.2)

## 2021-05-23 LAB — SARS CORONAVIRUS 2 (TAT 6-24 HRS): SARS Coronavirus 2: NEGATIVE

## 2021-05-23 MED ORDER — ALBUTEROL SULFATE (2.5 MG/3ML) 0.083% IN NEBU
2.5000 mg | INHALATION_SOLUTION | Freq: Once | RESPIRATORY_TRACT | Status: AC
  Administered 2021-05-23: 2.5 mg via RESPIRATORY_TRACT
  Filled 2021-05-23: qty 3

## 2021-05-23 MED ORDER — ROMIPLOSTIM INJECTION 500 MCG
3.0000 ug/kg | Freq: Once | SUBCUTANEOUS | Status: AC
  Administered 2021-05-23: 265 ug via SUBCUTANEOUS
  Filled 2021-05-23: qty 0.53

## 2021-05-29 ENCOUNTER — Other Ambulatory Visit: Payer: Self-pay | Admitting: Internal Medicine

## 2021-05-29 DIAGNOSIS — E538 Deficiency of other specified B group vitamins: Secondary | ICD-10-CM

## 2021-05-31 ENCOUNTER — Telehealth: Payer: BC Managed Care – PPO | Admitting: Physician Assistant

## 2021-05-31 DIAGNOSIS — R6889 Other general symptoms and signs: Secondary | ICD-10-CM

## 2021-05-31 MED ORDER — PREDNISONE 20 MG PO TABS
40.0000 mg | ORAL_TABLET | Freq: Every day | ORAL | 0 refills | Status: DC
Start: 2021-05-31 — End: 2021-06-24

## 2021-05-31 MED ORDER — OSELTAMIVIR PHOSPHATE 75 MG PO CAPS: 75.0000 mg | ORAL_CAPSULE | Freq: Two times a day (BID) | ORAL | 0 refills | Status: AC

## 2021-05-31 MED ORDER — BENZONATATE 100 MG PO CAPS: 100.0000 mg | ORAL_CAPSULE | Freq: Three times a day (TID) | ORAL | 0 refills | Status: DC | PRN

## 2021-05-31 NOTE — Progress Notes (Signed)
Virtual Visit Consent   Carly [name], you are scheduled for a virtual visit with a [place] provider today.     Just as with appointments in the office, your consent must be obtained to participate.  Your consent will be active for this visit and any virtual visit you may have with one of our providers in the next 365 days.     If you have a MyChart account, a copy of this consent can be sent to you electronically.  All virtual visits are billed to your insurance company just like a traditional visit in the office.    As this is a virtual visit, video technology does not allow for your provider to perform a traditional examination.  This may limit your provider's ability to fully assess your condition.  If your provider identifies any concerns that need to be evaluated in person or the need to arrange testing (such as labs, EKG, etc.), we will make arrangements to do so.     Although advances in technology are sophisticated, we cannot ensure that it will always work on either your end or our end.  If the connection with a video visit is poor, the visit may have to be switched to a telephone visit.  With either a video or telephone visit, we are not always able to ensure that we have a secure connection.     I need to obtain your verbal consent now.   Are you willing to proceed with your visit today?    Carly [name] has provided verbal consent on 05/31/2021 for a virtual visit (video or telephone).   Leeanne Rio, [place]   Date: 05/31/2021 4:10 PM   Virtual Visit via Video Note   I, Leeanne Rio, [name]  ([id number], [birth year]) on 05/31/21 at  4:00 PM EST by a video-enabled telemedicine application and verified that I am speaking with the correct person using two identifiers.  Location: Patient: Virtual Visit Location Patient: Home Provider: Virtual Visit Location Provider: Home Office   I discussed the limitations of evaluation and management by  telemedicine and the availability of in person appointments. The patient expressed understanding and agreed to proceed.    History of Present Illness: Carly [name] is a 50 y.o. who identifies as a female who was assigned female at birth, and is being seen today for URI symptoms starting abruptly yesterday evening with chills, aches, headache, fatigue, sore throat and nasal/head congestion. Also with history of COPD and noting some chest tightness above her baseline. Is taking her medications as directed. Had COVID test today which was negative. She has history of ITP and multiple other health issues that raise her risk of more complicated infections.  HPI: HPI  Problems:  Patient Active Problem List   Diagnosis Date Noted   Gastroesophageal reflux disease without esophagitis 07/01/2020   Hyperlipidemia 07/01/2020   Posttraumatic vulvar hematoma 04/07/2020   Dizziness 03/29/2020   Fatty liver 12/24/2019   Angiolipoma of left kidney 12/24/2019   Chronic ITP (idiopathic thrombocytopenia) (HCC) 12/24/2019   Chronic ITP (idiopathic thrombocytopenic purpura) (HCC) 12/02/2019   Idiopathic thrombocytopenic purpura (ITP) (HCC) 11/10/2019   Verruca vulgaris 09/29/2019   Plantar fasciitis, bilateral 09/29/2019   Osteoarthritis of both knees 09/29/2019   Overactive bladder 07/14/2019   B12 deficiency 06/10/2019   Vitamin D deficiency 06/10/2019   Headache 01/30/2019   Elevated LFTs 01/30/2019   Abnormal uterine bleeding (AUB) 08/26/2018   Depression, major,  single episode, moderate ([place]) 08/04/2018   Iron deficiency 09/02/2017   Menorrhagia with regular cycle 10/22/2016   Insomnia 08/15/2016   Nocturia 06/18/2016   Thrombocytopenia ([place]) 02/12/2016   COPD exacerbation ([place]) 01/31/2016   Carpal tunnel syndrome 01/20/2016   Lateral epicondylitis 01/20/2016   Obesity 01/04/2016   Palpitations 12/15/2015   Myalgia 10/31/2015   Fatigue 10/31/2015   Mixed incontinence 03/09/2015   COPD  (chronic obstructive pulmonary disease) ([place]) 01/13/2015   Acute bronchitis 11/15/2014   Female hirsutism 09/22/2014   Elevated testosterone level in female 09/22/2014   Generalized anxiety disorder 08/23/2014   Tobacco use disorder 08/23/2014   Obesity (BMI 30-39.9) 08/23/2014   HTN (hypertension) 08/23/2014    Allergies:  Allergies  Allergen Reactions   Meloxicam Nausea Only   Medications:  Current Outpatient Medications:    benzonatate (TESSALON) 100 MG capsule, Take 1 capsule (100 mg total) by mouth 3 (three) times daily as needed for cough., Disp: 30 capsule, Rfl: 0   oseltamivir (TAMIFLU) 75 MG capsule, Take 1 capsule (75 mg total) by mouth 2 (two) times daily for 5 days., Disp: 10 capsule, Rfl: 0   predniSONE (DELTASONE) 20 MG tablet, Take 2 tablets (40 mg total) by mouth daily with breakfast., Disp: 10 tablet, Rfl: 0   acetaminophen (TYLENOL) 500 MG tablet, Take 500 mg by mouth every 6 (six) hours as needed., Disp: , Rfl:    albuterol (VENTOLIN HFA) 108 (90 Base) MCG/ACT inhaler, Inhale 2 puffs into the lungs every 6 (six) hours as needed for wheezing or shortness of breath (cough)., Disp: 1 each, Rfl: 0   ALPRAZolam (XANAX) 0.25 MG tablet, TAKE 1 TABLET BY MOUTH EVERY DAY AS NEEDED FOR ANXIETY, Disp: 30 tablet, Rfl: 0   amLODipine (NORVASC) 10 MG tablet, Take 1 tablet (10 mg total) by mouth daily., Disp: 90 tablet, Rfl: 3   carvedilol (COREG) 25 MG tablet, Take 1 tablet (25 mg total) by mouth 2 (two) times daily with a meal., Disp: 180 tablet, Rfl: 3   cephALEXin (KEFLEX) 500 MG capsule, Take 1 capsule (500 mg total) by mouth 2 (two) times daily., Disp: 14 capsule, Rfl: 0   cetirizine (ZYRTEC) 10 MG tablet, Take 1 tablet (10 mg total) by mouth daily., Disp: 90 tablet, Rfl: 3   Cholecalciferol 1.25 MG (50000 UT) capsule, Take 1 capsule (50,000 Units total) by mouth every 30 (thirty) days. 1x per month note this, Disp: 13 capsule, Rfl: 0   cyanocobalamin (,VITAMIN B-12,) 1000 MCG/ML  injection, INJECT 1 ML (1,000 MCG TOTAL) INTO THE MUSCLE EVERY 30 DAYS., Disp: 1 mL, Rfl: 15   cyclobenzaprine (FLEXERIL) 10 MG tablet, Take 1 tablet (10 mg total) by mouth 3 (three) times daily as needed for muscle spasms. (Patient not taking: Reported on 05/03/2021), Disp: 15 tablet, Rfl: 0   diclofenac Sodium (VOLTAREN) 1 % GEL, Apply 2-4 g topically 4 (four) times daily. 2 grams upper body qid prn and 4 gram lower body qid prn, Disp: 150 g, Rfl: 11   dicyclomine (BENTYL) 10 MG capsule, TAKE 1 CAPSULE (10 MG TOTAL) BY MOUTH 3 (THREE) TIMES DAILY AS NEEDED FOR SPASMS. BEFORE FOOD, Disp: 90 capsule, Rfl: 11   escitalopram (LEXAPRO) 20 MG tablet, TAKE 1 TABLET BY MOUTH EVERY DAY, Disp: 90 tablet, Rfl: 3   Fluticasone-Umeclidin-Vilant (TRELEGY ELLIPTA) 200-62.5-25 MCG/ACT AEPB, Inhale 1 puff into the lungs daily., Disp: 60 each, Rfl: 0   Fluticasone-Umeclidin-Vilant (TRELEGY ELLIPTA) 200-62.5-25 MCG/INH AEPB, Inhale 1 puff into the lungs daily.,  Disp: 28 each, Rfl: 11   ibuprofen (ADVIL) 600 MG tablet, Take 600 mg by mouth every 6 (six) hours as needed., Disp: , Rfl:    mirabegron ER (MYRBETRIQ) 25 MG TB24 tablet, Take 1 tablet (25 mg total) by mouth daily. (Patient not taking: Reported on 05/03/2021), Disp: 90 tablet, Rfl: 3   naproxen (NAPROSYN) 500 MG tablet, Take 1 tablet (500 mg total) by mouth 2 (two) times daily with a meal., Disp: 20 tablet, Rfl: 0   omeprazole (PRILOSEC) 20 MG capsule, Take 1 capsule (20 mg total) by mouth daily., Disp: 90 capsule, Rfl: 3   ondansetron (ZOFRAN ODT) 4 MG disintegrating tablet, Take 1 tablet (4 mg total) by mouth every 8 (eight) hours as needed for nausea or vomiting., Disp: 20 tablet, Rfl: 0   ondansetron (ZOFRAN) 4 MG tablet, TAKE 1 TABLET BY MOUTH EVERY 8 HOURS AS NEEDED FOR NAUSEA AND VOMITING, Disp: 10 tablet, Rfl: 11   potassium chloride (KLOR-CON) 10 MEQ tablet, Take 1 tablet (10 mEq total) by mouth daily for 5 days., Disp: 5 tablet, Rfl: 0    spironolactone (ALDACTONE) 25 MG tablet, Take 1 tablet (25 mg total) by mouth daily. In am (Patient not taking: Reported on 05/03/2021), Disp: 90 tablet, Rfl: 3   Syringe/Needle, Disp, 25G X 1-1/2" 1 ML MISC, 1 Device by Does not apply route as directed., Disp: 30 each, Rfl: 11   traMADol (ULTRAM) 50 MG tablet, Take 1 tablet (50 mg total) by mouth every 12 (twelve) hours as needed., Disp: 60 tablet, Rfl: 0   traZODone (DESYREL) 50 MG tablet, Take 0.5-1 tablets (25-50 mg total) by mouth at bedtime as needed for sleep., Disp: 90 tablet, Rfl: 3  Observations/Objective: Patient is well-developed, well-nourished in no acute distress.  Resting comfortably at home.  Head is normocephalic, atraumatic.  No labored breathing.  Speech is clear and coherent with logical content.  Patient is alert and oriented at baseline.    Assessment and Plan: 1. Flu-like symptoms - benzonatate (TESSALON) 100 MG capsule; Take 1 capsule (100 mg total) by mouth 3 (three) times daily as needed for cough.  Dispense: 30 capsule; Refill: 0 - oseltamivir (TAMIFLU) 75 MG capsule; Take 1 capsule (75 mg total) by mouth 2 (two) times daily for 5 days.  Dispense: 10 capsule; Refill: 0 - predniSONE (DELTASONE) 20 MG tablet; Take 2 tablets (40 mg total) by mouth daily with breakfast.  Dispense: 10 tablet; Refill: 0 Start Tamiflu BID x 5 days. Supportive measures, OTC medications and Vitamin recommendations reviewed. Tessalon per orders. She is to continue her chronic COPD medications. I have placed a burt of prednisone on file at pharmacy for her to take as directed if needed for any worsening chest tightness/wheezing despite her maintenance and rescue inhalers. Work note written. Strict ER precautions reviewed with patient.   Follow Up Instructions: I discussed the assessment and treatment plan with the patient. The patient was provided an opportunity to ask questions and all were answered. The patient agreed with the plan and  demonstrated an understanding of the instructions.  A copy of instructions were sent to the patient via MyChart unless otherwise noted below.   The patient was advised to call back or seek an in-person evaluation if the symptoms worsen or if the condition fails to improve as anticipated.  Time:  I spent 10 minutes with the patient via telehealth technology discussing the above problems/concerns.    Leeanne Rio, PA-C

## 2021-05-31 NOTE — Patient Instructions (Signed)
Carly [name], thank you for joining Leeanne Rio, PA-C for today's virtual visit.  While this provider is not your primary care provider (PCP), if your PCP is located in our provider database this encounter information will be shared with them immediately following your visit.  Consent: (Patient) Carly [name] provided verbal consent for this virtual visit at the beginning of the encounter.  Current Medications:  Current Outpatient Medications:    acetaminophen (TYLENOL) 500 MG tablet, Take 500 mg by mouth every 6 (six) hours as needed., Disp: , Rfl:    albuterol (VENTOLIN HFA) 108 (90 Base) MCG/ACT inhaler, Inhale 2 puffs into the lungs every 6 (six) hours as needed for wheezing or shortness of breath (cough)., Disp: 1 each, Rfl: 0   ALPRAZolam (XANAX) 0.25 MG tablet, TAKE 1 TABLET BY MOUTH EVERY DAY AS NEEDED FOR ANXIETY, Disp: 30 tablet, Rfl: 0   amLODipine (NORVASC) 10 MG tablet, Take 1 tablet (10 mg total) by mouth daily., Disp: 90 tablet, Rfl: 3   benzonatate (TESSALON) 100 MG capsule, Take 1 capsule (100 mg total) by mouth 3 (three) times daily as needed for cough., Disp: 30 capsule, Rfl: 0   carvedilol (COREG) 25 MG tablet, Take 1 tablet (25 mg total) by mouth 2 (two) times daily with a meal., Disp: 180 tablet, Rfl: 3   cephALEXin (KEFLEX) 500 MG capsule, Take 1 capsule (500 mg total) by mouth 2 (two) times daily., Disp: 14 capsule, Rfl: 0   cetirizine (ZYRTEC) 10 MG tablet, Take 1 tablet (10 mg total) by mouth daily., Disp: 90 tablet, Rfl: 3   Cholecalciferol 1.25 MG (50000 UT) capsule, Take 1 capsule (50,000 Units total) by mouth every 30 (thirty) days. 1x per month note this, Disp: 13 capsule, Rfl: 0   cyanocobalamin (,VITAMIN B-12,) 1000 MCG/ML injection, INJECT 1 ML (1,000 MCG TOTAL) INTO THE MUSCLE EVERY 30 DAYS., Disp: 1 mL, Rfl: 15   cyclobenzaprine (FLEXERIL) 10 MG tablet, Take 1 tablet (10 mg total) by mouth 3 (three) times daily as needed for muscle spasms. (Patient  not taking: Reported on 05/03/2021), Disp: 15 tablet, Rfl: 0   diclofenac Sodium (VOLTAREN) 1 % GEL, Apply 2-4 g topically 4 (four) times daily. 2 grams upper body qid prn and 4 gram lower body qid prn, Disp: 150 g, Rfl: 11   dicyclomine (BENTYL) 10 MG capsule, TAKE 1 CAPSULE (10 MG TOTAL) BY MOUTH 3 (THREE) TIMES DAILY AS NEEDED FOR SPASMS. BEFORE FOOD, Disp: 90 capsule, Rfl: 11   escitalopram (LEXAPRO) 20 MG tablet, TAKE 1 TABLET BY MOUTH EVERY DAY, Disp: 90 tablet, Rfl: 3   fluticasone (FLONASE) 50 MCG/ACT nasal spray, Place 2 sprays into both nostrils daily., Disp: 16 g, Rfl: 0   Fluticasone-Umeclidin-Vilant (TRELEGY ELLIPTA) 200-62.5-25 MCG/ACT AEPB, Inhale 1 puff into the lungs daily., Disp: 60 each, Rfl: 0   Fluticasone-Umeclidin-Vilant (TRELEGY ELLIPTA) 200-62.5-25 MCG/INH AEPB, Inhale 1 puff into the lungs daily., Disp: 28 each, Rfl: 11   ibuprofen (ADVIL) 600 MG tablet, Take 600 mg by mouth every 6 (six) hours as needed., Disp: , Rfl:    mirabegron ER (MYRBETRIQ) 25 MG TB24 tablet, Take 1 tablet (25 mg total) by mouth daily. (Patient not taking: Reported on 05/03/2021), Disp: 90 tablet, Rfl: 3   naproxen (NAPROSYN) 500 MG tablet, Take 1 tablet (500 mg total) by mouth 2 (two) times daily with a meal., Disp: 20 tablet, Rfl: 0   omeprazole (PRILOSEC) 20 MG capsule, Take 1 capsule (20 mg total) by  mouth daily., Disp: 90 capsule, Rfl: 3   ondansetron (ZOFRAN ODT) 4 MG disintegrating tablet, Take 1 tablet (4 mg total) by mouth every 8 (eight) hours as needed for nausea or vomiting., Disp: 20 tablet, Rfl: 0   ondansetron (ZOFRAN) 4 MG tablet, TAKE 1 TABLET BY MOUTH EVERY 8 HOURS AS NEEDED FOR NAUSEA AND VOMITING, Disp: 10 tablet, Rfl: 11   potassium chloride (KLOR-CON) 10 MEQ tablet, Take 1 tablet (10 mEq total) by mouth daily for 5 days., Disp: 5 tablet, Rfl: 0   predniSONE (DELTASONE) 20 MG tablet, Take 2 tablets (40 mg total) by mouth daily with breakfast. (Patient not taking: Reported on  05/03/2021), Disp: 10 tablet, Rfl: 0   spironolactone (ALDACTONE) 25 MG tablet, Take 1 tablet (25 mg total) by mouth daily. In am (Patient not taking: Reported on 05/03/2021), Disp: 90 tablet, Rfl: 3   Syringe/Needle, Disp, 25G X 1-1/2" 1 ML MISC, 1 Device by Does not apply route as directed., Disp: 30 each, Rfl: 11   traMADol (ULTRAM) 50 MG tablet, Take 1 tablet (50 mg total) by mouth every 12 (twelve) hours as needed., Disp: 60 tablet, Rfl: 0   traZODone (DESYREL) 50 MG tablet, Take 0.5-1 tablets (25-50 mg total) by mouth at bedtime as needed for sleep., Disp: 90 tablet, Rfl: 3   Medications ordered in this encounter:  No orders of the defined types were placed in this encounter.    *If you need refills on other medications prior to your next appointment, please contact your pharmacy*  Follow-Up: Call back or seek an in-person evaluation if the symptoms worsen or if the condition fails to improve as anticipated.  Other Instructions Please keep well-hydrated and get plenty of rest.  Continue the OTC medications you have been taking. Start the Tamiflu as directed along with the Tessalon for cough. If you note any worsening of chest tightness despite use of your COPD medications, start the prednisone and take as directed.   If symptoms worsen or any chest pain or significant shortness of breath, you need an ER evaluation.    If you have been instructed to have an in-person evaluation today at a local Urgent Care facility, please use the link below. It will take you to a list of all of our available [facility], including address, phone number and hours of operation. Please do not delay care.  [facility]  If you or a family member do not have a primary care provider, use the link below to schedule a visit and establish care. When you choose a [facility] primary care physician or advanced practice provider, you gain a long-term partner in health. Find a Primary  Care Provider  Learn more about [facility]'s in-office and virtual care options: [facility]

## 2021-06-06 ENCOUNTER — Encounter: Payer: Self-pay | Admitting: Pulmonary Disease

## 2021-06-06 ENCOUNTER — Other Ambulatory Visit: Payer: Self-pay

## 2021-06-06 ENCOUNTER — Ambulatory Visit (INDEPENDENT_AMBULATORY_CARE_PROVIDER_SITE_OTHER): Payer: BC Managed Care – PPO | Admitting: Pulmonary Disease

## 2021-06-06 VITALS — BP 120/80 | HR 67 | Temp 97.0°F | Ht 62.0 in | Wt 193.4 lb

## 2021-06-06 DIAGNOSIS — R0602 Shortness of breath: Secondary | ICD-10-CM | POA: Diagnosis not present

## 2021-06-06 DIAGNOSIS — J449 Chronic obstructive pulmonary disease, unspecified: Secondary | ICD-10-CM

## 2021-06-06 DIAGNOSIS — F17210 Nicotine dependence, cigarettes, uncomplicated: Secondary | ICD-10-CM

## 2021-06-06 DIAGNOSIS — R768 Other specified abnormal immunological findings in serum: Secondary | ICD-10-CM

## 2021-06-06 MED ORDER — BREZTRI AEROSPHERE 160-9-4.8 MCG/ACT IN AERO: 2.0000 | INHALATION_SPRAY | Freq: Two times a day (BID) | RESPIRATORY_TRACT | 0 refills | Status: DC

## 2021-06-06 NOTE — Progress Notes (Signed)
Subjective:    Patient ID: Carly [name], female    DOB: 02-27-1971, 50 y.o.   MRN: [id number] Chief Complaint  Patient presents with   Follow-up    Asthma COPD-  coughing, wheezing, sob     HPI The patient is a 50 year old current smoker (1 PPD) who presents for follow-up on the issue of COPD.  She was initially seen here on 28 April 2020 but failed to follow up as recommended.  She presents today because of issues with persistent coughing, wheezing and shortness of breath.  She has tenacious sputum production that does not appear purulent.  No hemoptysis.  No fevers, chills or sweats.  She had PFTs performed on 8 November with results as below.  She has mild to moderate COPD with asthmatic component.  She has been on Trelegy 200, 1 puff daily however notes that this initially worked well for her but now does not seem to last the full day.  She has been struggling with quitting smoking and wanted to know about the previously recommended website [email or web address] [facility]).   She does not endorse any orthopnea or paroxysmal nocturnal dyspnea.  No recent issues with gastroesophageal reflux.  No lower extremity edema.  With regards to her cough Tessalon helps.  DATA 05/23/2021 PFTs: FEV1 1.60 L or 60% predicted, FVC 2.32 L or 69% predicted, FEV1/FVC 69%.  Lung volumes show hyperinflation and air trapping there is a small airways component with improvement postbronchodilator.  Diffusion capacity is mildly reduced.  Review of Systems A 10 point review of systems was performed and it is as noted above otherwise negative.  Patient Active Problem List   Diagnosis Date Noted   Gastroesophageal reflux disease without esophagitis 07/01/2020   Hyperlipidemia 07/01/2020   Posttraumatic vulvar hematoma 04/07/2020   Dizziness 03/29/2020   Fatty liver 12/24/2019   Angiolipoma of left kidney 12/24/2019   Chronic ITP (idiopathic thrombocytopenia) (HCC) 12/24/2019   Chronic ITP (idiopathic  thrombocytopenic purpura) (HCC) 12/02/2019   Idiopathic thrombocytopenic purpura (ITP) (HCC) 11/10/2019   Verruca vulgaris 09/29/2019   Plantar fasciitis, bilateral 09/29/2019   Osteoarthritis of both knees 09/29/2019   Overactive bladder 07/14/2019   B12 deficiency 06/10/2019   Vitamin D deficiency 06/10/2019   Headache 01/30/2019   Elevated LFTs 01/30/2019   Abnormal uterine bleeding (AUB) 08/26/2018   Depression, major, single episode, moderate ([place]) 08/04/2018   Iron deficiency 09/02/2017   Menorrhagia with regular cycle 10/22/2016   Insomnia 08/15/2016   Nocturia 06/18/2016   Thrombocytopenia ([place]) 02/12/2016   COPD exacerbation ([place]) 01/31/2016   Carpal tunnel syndrome 01/20/2016   Lateral epicondylitis 01/20/2016   Obesity 01/04/2016   Palpitations 12/15/2015   Myalgia 10/31/2015   Fatigue 10/31/2015   Mixed incontinence 03/09/2015   COPD (chronic obstructive pulmonary disease) ([place]) 01/13/2015   Acute bronchitis 11/15/2014   Female hirsutism 09/22/2014   Elevated testosterone level in female 09/22/2014   Generalized anxiety disorder 08/23/2014   Tobacco use disorder 08/23/2014   Obesity (BMI 30-39.9) 08/23/2014   HTN (hypertension) 08/23/2014   Social History   Tobacco Use   Smoking status: Every Day    Packs/day: 1.50    Years: 28.00    Pack years: 42.00    Types: Cigarettes   Smokeless tobacco: Never  Substance Use Topics   Alcohol use: Yes    Alcohol/week: 8.0 standard drinks    Types: 8 Standard drinks or equivalent per week    Comment: Beer (Can)    Allergies  Allergen  Reactions   Meloxicam Nausea Only   Current Meds  Medication Sig   acetaminophen (TYLENOL) 500 MG tablet Take 500 mg by mouth every 6 (six) hours as needed.   albuterol (VENTOLIN HFA) 108 (90 Base) MCG/ACT inhaler Inhale 2 puffs into the lungs every 6 (six) hours as needed for wheezing or shortness of breath (cough).   amLODipine (NORVASC) 10 MG tablet Take 1 tablet (10 mg total) by  mouth daily.   benzonatate (TESSALON) 100 MG capsule Take 1 capsule (100 mg total) by mouth 3 (three) times daily as needed for cough.    carvedilol (COREG) 25 MG tablet Take 1 tablet (25 mg total) by mouth 2 (two) times daily with a meal.   cephALEXin (KEFLEX) 500 MG capsule Take 1 capsule (500 mg total) by mouth 2 (two) times daily. (Patient not taking: Reported on 06/13/2021)   cetirizine (ZYRTEC) 10 MG tablet Take 1 tablet (10 mg total) by mouth daily.   cyanocobalamin (,VITAMIN B-12,) 1000 MCG/ML injection INJECT 1 ML (1,000 MCG TOTAL) INTO THE MUSCLE EVERY 30 DAYS.   cyclobenzaprine (FLEXERIL) 10 MG tablet Take 1 tablet (10 mg total) by mouth 3 (three) times daily as needed for muscle spasms.   diclofenac Sodium (VOLTAREN) 1 % GEL Apply 2-4 g topically 4 (four) times daily. 2 grams upper body qid prn and 4 gram lower body qid prn   dicyclomine (BENTYL) 10 MG capsule TAKE 1 CAPSULE (10 MG TOTAL) BY MOUTH 3 (THREE) TIMES DAILY AS NEEDED FOR SPASMS. BEFORE FOOD   escitalopram (LEXAPRO) 20 MG tablet TAKE 1 TABLET BY MOUTH EVERY DAY   ibuprofen (ADVIL) 600 MG tablet Take 600 mg by mouth every 6 (six) hours as needed.   mirabegron ER (MYRBETRIQ) 25 MG TB24 tablet Take 1 tablet (25 mg total) by mouth daily.   naproxen (NAPROSYN) 500 MG tablet Take 1 tablet (500 mg total) by mouth 2 (two) times daily with a meal.   omeprazole (PRILOSEC) 20 MG capsule Take 1 capsule (20 mg total) by mouth daily.   ondansetron (ZOFRAN ODT) 4 MG disintegrating tablet Take 1 tablet (4 mg total) by mouth every 8 (eight) hours as needed for nausea or vomiting.   ondansetron (ZOFRAN) 4 MG tablet TAKE 1 TABLET BY MOUTH EVERY 8 HOURS AS NEEDED FOR NAUSEA AND VOMITING   predniSONE (DELTASONE) 20 MG tablet Take 2 tablets (40 mg total) by mouth daily with breakfast. (Patient not taking: Reported on 06/13/2021)   spironolactone (ALDACTONE) 25 MG tablet Take 1 tablet (25 mg total) by mouth daily. In am   Syringe/Needle, Disp, 25G  X 1-1/2" 1 ML MISC 1 Device by Does not apply route as directed.   traMADol (ULTRAM) 50 MG tablet Take 1 tablet (50 mg total) by mouth every 12 (twelve) hours as needed.   traZODone (DESYREL) 50 MG tablet Take 0.5-1 tablets (25-50 mg total) by mouth at bedtime as needed for sleep.   [DISCONTINUED] ALPRAZolam (XANAX) 0.25 MG tablet TAKE 1 TABLET BY MOUTH EVERY DAY AS NEEDED FOR ANXIETY   [DISCONTINUED] Budeson-Glycopyrrol-Formoterol (BREZTRI AEROSPHERE) 160-9-4.8 MCG/ACT AERO Inhale 2 puffs into the lungs in the morning and at bedtime.   [DISCONTINUED] Cholecalciferol 1.25 MG (50000 UT) capsule Take 1 capsule (50,000 Units total) by mouth every 30 (thirty) days. 1x per month note this   [DISCONTINUED] Fluticasone-Umeclidin-Vilant (TRELEGY ELLIPTA) 200-62.5-25 MCG/ACT AEPB Inhale 1 puff into the lungs daily.   [DISCONTINUED] Fluticasone-Umeclidin-Vilant (TRELEGY ELLIPTA) 200-62.5-25 MCG/INH AEPB Inhale 1 puff into the lungs daily.  Immunization History  Administered Date(s) Administered   Influenza,inj,Quad PF,6+ Mos 08/23/2014   Influenza-Unspecified 04/16/2019, 04/26/2021   Tdap 08/23/2014       Objective:   Physical Exam BP 120/80 (BP Location: Left Arm, Patient Position: Sitting, Cuff Size: Normal)   Pulse 67   Temp (!) 97 F (36.1 C) (Oral)   Ht 5\' 2"  (1.575 m)   Wt 193 lb 6.4 oz (87.7 kg)   SpO2 97%   BMI 35.37 kg/m   GENERAL: Awake, well-developed, no acute distress, fully ambulatory.  Overweight. HEAD: Normocephalic, atraumatic.  EYES: Pupils equal, round, reactive to light.  No scleral icterus.  MOUTH: Nose/mouth/throat not examined due to masking requirements for COVID 19. NECK: Supple. No thyromegaly. Trachea midline. No JVD.  No adenopathy. PULMONARY: Good air entry bilaterally.  Coarse breath sounds bilaterally no other adventitious sounds. CARDIOVASCULAR: S1 and S2. Regular rate and rhythm.  No rubs, murmurs or gallops heard. ABDOMEN: Benign.   MUSCULOSKELETAL: No  joint deformity, no clubbing, no edema.  NEUROLOGIC: No focal deficit, speech is fluent, no gait disturbance. SKIN: Intact,warm,dry.  Ecchymoses and petechiae upper extremities. PSYCH: Normal mood and behavior.      Assessment & Plan:     ICD-10-CM   1. Asthma-COPD overlap syndrome ([place])  J44.9    Feels that Trelegy not working as well Switch to [employer] 2 puffs twice a day Samples provided for the patient Needs to quit smoking    2. Shortness of breath  R06.02    Poorly compensated COPD Breztri as above Quit smoking    3. Positive ANA (antinuclear antibody) of questionable significance  R76.8    Previously noted No overt signs/symptoms of lupus Adds complexity to her management    4. Tobacco dependence due to cigarettes  F17.210    Needs to quit smoking Counseled regards to quitting Provided information for [email or web address] website     Meds ordered this encounter  Medications   Budeson-Glycopyrrol-Formoterol (BREZTRI AEROSPHERE) 160-9-4.8 MCG/ACT AERO    Sig: Inhale 2 puffs into the lungs in the morning and at bedtime.    Dispense:  5.9 g    Refill:  0    Order Specific Question:   Lot Number?    Answer:   [id number]    Order Specific Question:   Expiration Date?    Answer:   12/15/2023    Order Specific Question:   Manufacturer?    Answer:   [employer] [[id number]]    Order Specific Question:   Quantity    Answer:   1   Patient was advised that she needs to quit smoking this should help with her symptoms of cough and increased sputum production.  She has been given as sample of Breztri however ultimately agement of her symptoms is with discontinuation of all tobacco products.  We will see her in follow-up in 2 to 3 months time she is to contact us sooner should any new problems arise.  Renold Don, MD Advanced Bronchoscopy PCCM [facility]    *This note was dictated using voice recognition software/Dragon.  Despite best efforts to proofread, errors  can occur which can change the meaning.  Any change was purely unintentional.

## 2021-06-06 NOTE — Patient Instructions (Addendum)
Please make an effort to quit smoking.  We have given you the information on the website called become an [email or web address].  We are giving you a trial of Breztri 2 puffs twice a day please let us know how this does for you.  We have provided you a coupon so if we call in the prescription you can use this to reduce your co-pay.   DO NOT USE TRELEGY WHILE USING THE BREZTRI.   We will see him in follow-up in 2 to 3 months time call sooner should any new problems arise.

## 2021-06-12 ENCOUNTER — Other Ambulatory Visit: Payer: Self-pay | Admitting: Internal Medicine

## 2021-06-12 DIAGNOSIS — M898X9 Other specified disorders of bone, unspecified site: Secondary | ICD-10-CM

## 2021-06-12 NOTE — Progress Notes (Signed)
[place]  [phone] [phone]  ID: Carly [name] OB: 10-Jan-1971  MR#: [id number]  AOZ#:[id number]  Patient Care Team: McLean-Scocuzza, Nino Glow, MD as PCP - General (Internal Medicine) Lloyd Huger, MD as Consulting Physician (Oncology)   CHIEF COMPLAINT: ITP.  INTERVAL HISTORY: Patient returns to clinic today for repeat laboratory work, further evaluation, and continuation of Nplate.  She continues to feel well and remains asymptomatic. She denies any easy bleeding or bruising. She has no neurologic complaints.  She denies any recent fevers or illnesses.  She has a good appetite and denies weight loss.  She denies any chest pain, shortness of breath, cough, or hemoptysis. She denies any nausea, vomiting, constipation, or diarrhea.  She has no urinary complaints.  Patient offers no specific complaints today.  REVIEW OF SYSTEMS:   Review of Systems  Constitutional: Negative.  Negative for fever, malaise/fatigue and weight loss.  Respiratory: Negative.  Negative for cough, hemoptysis and shortness of breath.   Cardiovascular: Negative.  Negative for chest pain and leg swelling.  Gastrointestinal: Negative.  Negative for abdominal pain, blood in stool and melena.  Genitourinary: Negative.  Negative for hematuria.  Musculoskeletal: Negative.  Negative for back pain.  Skin: Negative.  Negative for rash.  Neurological: Negative.  Negative for dizziness, focal weakness, weakness and headaches.  Endo/Heme/Allergies: Negative.  Does not bruise/bleed easily.  Psychiatric/Behavioral: Negative.  The patient is not nervous/anxious.    As per HPI. Otherwise, a complete review of systems is negative.  PAST MEDICAL HISTORY: Past Medical History:  Diagnosis Date   Anxiety    Chicken pox    Depression    Elevated testosterone level in female    Emphysema of lung ([place])    bronchitis   Frequent headaches    Hay fever    Hypertension     Idiopathic thrombocytopenic purpura (ITP) (HCC)    Positive ANA (antinuclear antibody)     PAST SURGICAL HISTORY: Past Surgical History:  Procedure Laterality Date   HEMATOMA EVACUATION N/A 04/07/2020   Procedure: EVACUATION HEMATOMA  AND APPLICATION OF PRESSURE DRESSING;  Surgeon: Benjaman Kindler, MD;  Location: [facility] ORS;  Service: Gynecology;  Laterality: N/A;   HYSTEROSCOPY WITH NOVASURE N/A 02/21/2018   Procedure: HYSTEROSCOPY WITH NOVASURE, POLYPECTOMY;  Surgeon: Benjaman Kindler, MD;  Location: [facility] ORS;  Service: Gynecology;  Laterality: N/A;   TUBAL LIGATION  1997   TUBAL LIGATION      FAMILY HISTORY: Family History  Problem Relation Age of Onset   Hyperlipidemia Mother    Heart disease Mother    Stroke Mother    Depression Mother    Mental illness Mother    Heart attack Mother 87   Hyperlipidemia Father    Depression Father    Mental illness Father    Diabetes Paternal Grandfather    Cancer Cousin     ADVANCED DIRECTIVES (Y/N):  N  HEALTH MAINTENANCE: Social History   Tobacco Use   Smoking status: Every Day    Packs/day: 1.50    Years: 28.00    Pack years: 42.00    Types: Cigarettes   Smokeless tobacco: Never  Vaping Use   Vaping Use: Never used  Substance Use Topics   Alcohol use: Yes    Alcohol/week: 8.0 standard drinks    Types: 8 Standard drinks or equivalent per week    Comment: Beer (Can)    Drug use: No    Comment: CBD oil     Colonoscopy:  PAP:  Bone density:  Lipid panel:  Allergies  Allergen Reactions   Meloxicam Nausea Only    Current Outpatient Medications  Medication Sig Dispense Refill   acetaminophen (TYLENOL) 500 MG tablet Take 500 mg by mouth every 6 (six) hours as needed.     albuterol (VENTOLIN HFA) 108 (90 Base) MCG/ACT inhaler Inhale 2 puffs into the lungs every 6 (six) hours as needed for wheezing or shortness of breath (cough). 1 each 0   amLODipine (NORVASC) 10 MG tablet Take 1 tablet (10 mg total) by mouth daily. 90  tablet 3   Budeson-Glycopyrrol-Formoterol (BREZTRI AEROSPHERE) 160-9-4.8 MCG/ACT AERO Inhale 2 puffs into the lungs in the morning and at bedtime. 5.9 g 0   carvedilol (COREG) 25 MG tablet Take 1 tablet (25 mg total) by mouth 2 (two) times daily with a meal. 180 tablet 3   cetirizine (ZYRTEC) 10 MG tablet Take 1 tablet (10 mg total) by mouth daily. 90 tablet 3   cyanocobalamin (,VITAMIN B-12,) 1000 MCG/ML injection INJECT 1 ML (1,000 MCG TOTAL) INTO THE MUSCLE EVERY 30 DAYS. 1 mL 15   cyclobenzaprine (FLEXERIL) 10 MG tablet Take 1 tablet (10 mg total) by mouth 3 (three) times daily as needed for muscle spasms. 15 tablet 0   diclofenac Sodium (VOLTAREN) 1 % GEL Apply 2-4 g topically 4 (four) times daily. 2 grams upper body qid prn and 4 gram lower body qid prn 150 g 11   dicyclomine (BENTYL) 10 MG capsule TAKE 1 CAPSULE (10 MG TOTAL) BY MOUTH 3 (THREE) TIMES DAILY AS NEEDED FOR SPASMS. BEFORE FOOD 90 capsule 11   escitalopram (LEXAPRO) 20 MG tablet TAKE 1 TABLET BY MOUTH EVERY DAY 90 tablet 3   ibuprofen (ADVIL) 600 MG tablet Take 600 mg by mouth every 6 (six) hours as needed.     mirabegron ER (MYRBETRIQ) 25 MG TB24 tablet Take 1 tablet (25 mg total) by mouth daily. 90 tablet 3   naproxen (NAPROSYN) 500 MG tablet Take 1 tablet (500 mg total) by mouth 2 (two) times daily with a meal. 20 tablet 0   omeprazole (PRILOSEC) 20 MG capsule Take 1 capsule (20 mg total) by mouth daily. 90 capsule 3   ondansetron (ZOFRAN ODT) 4 MG disintegrating tablet Take 1 tablet (4 mg total) by mouth every 8 (eight) hours as needed for nausea or vomiting. 20 tablet 0   ondansetron (ZOFRAN) 4 MG tablet TAKE 1 TABLET BY MOUTH EVERY 8 HOURS AS NEEDED FOR NAUSEA AND VOMITING 10 tablet 11   spironolactone (ALDACTONE) 25 MG tablet Take 1 tablet (25 mg total) by mouth daily. In am 90 tablet 3   Syringe/Needle, Disp, 25G X 1-1/2" 1 ML MISC 1 Device by Does not apply route as directed. 30 each 11   traMADol (ULTRAM) 50 MG tablet  Take 1 tablet (50 mg total) by mouth every 12 (twelve) hours as needed. 60 tablet 0   traZODone (DESYREL) 50 MG tablet Take 0.5-1 tablets (25-50 mg total) by mouth at bedtime as needed for sleep. 90 tablet 3   ALPRAZolam (XANAX) 0.25 MG tablet Take 1 tablet (0.25 mg total) by mouth daily as needed for anxiety. 30 tablet 2   benzonatate (TESSALON) 100 MG capsule Take 1 capsule (100 mg total) by mouth 3 (three) times daily as needed for cough. (Patient not taking: Reported on 06/13/2021) 30 capsule 0   cephALEXin (KEFLEX) 500 MG capsule Take 1 capsule (500 mg total) by mouth 2 (two) times daily. (Patient not taking:  Reported on 06/13/2021) 14 capsule 0   Cholecalciferol 1.25 MG (50000 UT) capsule Take 1 capsule (50,000 Units total) by mouth every 30 (thirty) days. 1x per month note this 13 capsule 0   potassium chloride (KLOR-CON) 10 MEQ tablet Take 1 tablet (10 mEq total) by mouth daily for 5 days. 5 tablet 0   predniSONE (DELTASONE) 20 MG tablet Take 2 tablets (40 mg total) by mouth daily with breakfast. (Patient not taking: Reported on 06/13/2021) 10 tablet 0   No current facility-administered medications for this visit.    OBJECTIVE: Vitals:   06/13/21 1302  BP: (!) 147/84  Pulse: 79  Resp: 18  SpO2: 99%     Body mass index is 35.12 kg/m.    ECOG FS:0 - Asymptomatic  General: Well-developed, well-nourished, no acute distress. Eyes: Pink conjunctiva, anicteric sclera. HEENT: Normocephalic, moist mucous membranes. Lungs: No audible wheezing or coughing. Heart: Regular rate and rhythm. Abdomen: Soft, nontender, no obvious distention. Musculoskeletal: No edema, cyanosis, or clubbing. Neuro: Alert, answering all questions appropriately. Cranial nerves grossly intact. Skin: No rashes or petechiae noted. Psych: Normal affect.   LAB RESULTS:  Lab Results  Component Value Date   NA 139 05/04/2021   K 3.8 05/04/2021   CL 101 05/04/2021   CO2 22 05/04/2021   GLUCOSE 75 05/04/2021    BUN 8 05/04/2021   CREATININE 0.61 05/04/2021   CALCIUM 9.1 05/04/2021   PROT 7.0 05/04/2021   ALBUMIN 4.5 05/04/2021   AST 41 (H) 05/04/2021   ALT 30 05/04/2021   ALKPHOS 96 05/04/2021   BILITOT 0.6 05/04/2021   GFRNONAA >60 04/08/2021   GFRAA >60 04/08/2020    Lab Results  Component Value Date   WBC 9.8 06/13/2021   NEUTROABS 5.8 06/13/2021   HGB 15.8 (H) 06/13/2021   HCT 45.9 06/13/2021   MCV 97.2 06/13/2021   PLT 52 (L) 06/13/2021     STUDIES: Pulmonary Function Test [facility] Only  Result Date: 05/24/2021 Spirometry Data Is Acceptable and Reproducible Severe Obstructive Airways Disease with Significant BD response +air trapping(Increased RV) Consider Outpatient Pulmonary Consultation if needed Clinical Correlation Advised   ASSESSMENT: ITP  PLAN:    1.  ITP: Confirmed with normal bone marrow biopsy and normal FISH and cytogenetics on July 22, 2019.  Patient had a positive ANA which is likely clinically insignificant.  The remainder of her laboratory work was either negative or within normal limits. CT scan on March 04, 2017 did not reveal splenomegaly.  Previously, patient received 1 week of prednisone at 1 mg/kg dose and had no appreciable change in her platelet count.  Rituxan was denied by insurance.  Previously, patient received 3 mcg/kg of Nplate with her platelet count increasing from 33-287.  It appears she only requires treatment approximately every 3 weeks.  Return to clinic in 3, 6, and 9 weeks for laboratory work and Nplate only if her platelet count remains below 100.  Patient will then return to clinic in 12 weeks for repeat laboratory work, further evaluation, and continuation of treatment if needed.   2.  History of positive ANA: Likely clinically insignificant.  Referral was previously sent to rheumatology. 3.  Labial laceration: Resolved.  I spent a total of 30 minutes reviewing chart data, face-to-face evaluation with the patient, counseling and coordination  of care as detailed above.    Patient expressed understanding and was in agreement with this plan. She also understands that She can call clinic at any time with any questions,  concerns, or complaints.    Lloyd Huger, MD   06/13/2021 1:44 PM

## 2021-06-13 ENCOUNTER — Other Ambulatory Visit: Payer: Self-pay | Admitting: Internal Medicine

## 2021-06-13 ENCOUNTER — Other Ambulatory Visit: Payer: Self-pay

## 2021-06-13 ENCOUNTER — Inpatient Hospital Stay: Payer: BC Managed Care – PPO

## 2021-06-13 ENCOUNTER — Inpatient Hospital Stay (HOSPITAL_BASED_OUTPATIENT_CLINIC_OR_DEPARTMENT_OTHER): Payer: BC Managed Care – PPO | Admitting: Oncology

## 2021-06-13 ENCOUNTER — Encounter: Payer: Self-pay | Admitting: Pulmonary Disease

## 2021-06-13 VITALS — BP 147/84 | HR 79 | Resp 18 | Wt 192.0 lb

## 2021-06-13 DIAGNOSIS — F17200 Nicotine dependence, unspecified, uncomplicated: Secondary | ICD-10-CM

## 2021-06-13 DIAGNOSIS — F32A Depression, unspecified: Secondary | ICD-10-CM | POA: Diagnosis not present

## 2021-06-13 DIAGNOSIS — Z7952 Long term (current) use of systemic steroids: Secondary | ICD-10-CM | POA: Diagnosis not present

## 2021-06-13 DIAGNOSIS — D693 Immune thrombocytopenic purpura: Secondary | ICD-10-CM

## 2021-06-13 DIAGNOSIS — J439 Emphysema, unspecified: Secondary | ICD-10-CM | POA: Diagnosis not present

## 2021-06-13 DIAGNOSIS — Z79899 Other long term (current) drug therapy: Secondary | ICD-10-CM | POA: Diagnosis not present

## 2021-06-13 DIAGNOSIS — E559 Vitamin D deficiency, unspecified: Secondary | ICD-10-CM

## 2021-06-13 DIAGNOSIS — F419 Anxiety disorder, unspecified: Secondary | ICD-10-CM | POA: Diagnosis not present

## 2021-06-13 DIAGNOSIS — F17210 Nicotine dependence, cigarettes, uncomplicated: Secondary | ICD-10-CM | POA: Diagnosis not present

## 2021-06-13 DIAGNOSIS — I10 Essential (primary) hypertension: Secondary | ICD-10-CM | POA: Diagnosis not present

## 2021-06-13 LAB — CBC WITH DIFFERENTIAL/PLATELET
Abs Immature Granulocytes: 0.04 10*3/uL (ref 0.00–0.07)
Basophils Absolute: 0.1 10*3/uL (ref 0.0–0.1)
Basophils Relative: 1 %
Eosinophils Absolute: 0.2 10*3/uL (ref 0.0–0.5)
Eosinophils Relative: 2 %
HCT: 45.9 % (ref 36.0–46.0)
Hemoglobin: 15.8 g/dL — ABNORMAL HIGH (ref 12.0–15.0)
Immature Granulocytes: 0 %
Lymphocytes Relative: 28 %
Lymphs Abs: 2.8 10*3/uL (ref 0.7–4.0)
MCH: 33.5 pg (ref 26.0–34.0)
MCHC: 34.4 g/dL (ref 30.0–36.0)
MCV: 97.2 fL (ref 80.0–100.0)
Monocytes Absolute: 0.9 10*3/uL (ref 0.1–1.0)
Monocytes Relative: 10 %
Neutro Abs: 5.8 10*3/uL (ref 1.7–7.7)
Neutrophils Relative %: 59 %
Platelets: 52 10*3/uL — ABNORMAL LOW (ref 150–400)
RBC: 4.72 MIL/uL (ref 3.87–5.11)
RDW: 12.2 % (ref 11.5–15.5)
WBC: 9.8 10*3/uL (ref 4.0–10.5)
nRBC: 0 % (ref 0.0–0.2)

## 2021-06-13 MED ORDER — ROMIPLOSTIM INJECTION 500 MCG
265.0000 ug | Freq: Once | SUBCUTANEOUS | Status: AC
  Administered 2021-06-13: 265 ug via SUBCUTANEOUS
  Filled 2021-06-13: qty 0.53

## 2021-06-13 MED ORDER — ALPRAZOLAM 0.25 MG PO TABS: 0.2500 mg | ORAL_TABLET | Freq: Every day | ORAL | 2 refills | Status: DC | PRN

## 2021-06-13 MED ORDER — BREZTRI AEROSPHERE 160-9-4.8 MCG/ACT IN AERO: 2.0000 | INHALATION_SPRAY | Freq: Two times a day (BID) | RESPIRATORY_TRACT | 5 refills | Status: DC

## 2021-06-13 MED ORDER — CHOLECALCIFEROL 1.25 MG (50000 UT) PO CAPS: 50000.0000 [IU] | ORAL_CAPSULE | ORAL | 0 refills | Status: DC

## 2021-06-13 MED ORDER — BENZONATATE 100 MG PO CAPS: 100.0000 mg | ORAL_CAPSULE | Freq: Three times a day (TID) | ORAL | 0 refills | Status: DC | PRN

## 2021-06-13 NOTE — Progress Notes (Signed)
Patient has questions about her ANA blood test results.

## 2021-06-13 NOTE — Telephone Encounter (Signed)
Did pt sign pain contract  Is she still in pain and where

## 2021-06-13 NOTE — Telephone Encounter (Signed)
Okay for Tessalon 100 mg 3 times daily as needed for cough.  This had been prescribed to her previously by primary care.

## 2021-06-13 NOTE — Telephone Encounter (Signed)
Call Patient to inquire about continued pain

## 2021-06-13 NOTE — Telephone Encounter (Signed)
RX Refill: xanax Last Seen: 05-03-21 Last Ordered: 05-02-21 Next Appt: NA

## 2021-06-13 NOTE — Telephone Encounter (Signed)
Patient is requesting a Rx for Tessalon for dry cough.   Dr. Patsey Berthold, please advise. Thanks

## 2021-06-13 NOTE — Telephone Encounter (Signed)
RX Refill: Tramadol Last Seen: 05-03-21 Last Ordered: 05-03-21 Next Appt: NA

## 2021-06-14 ENCOUNTER — Ambulatory Visit
Admission: RE | Admit: 2021-06-14 | Discharge: 2021-06-14 | Disposition: A | Discharge status: Home / Self Care | Payer: BC Managed Care – PPO | Source: Ambulatory Visit | Attending: Internal Medicine | Admitting: Internal Medicine

## 2021-06-14 DIAGNOSIS — J449 Chronic obstructive pulmonary disease, unspecified: Secondary | ICD-10-CM | POA: Diagnosis not present

## 2021-06-14 DIAGNOSIS — R053 Chronic cough: Secondary | ICD-10-CM | POA: Insufficient documentation

## 2021-06-14 DIAGNOSIS — J189 Pneumonia, unspecified organism: Secondary | ICD-10-CM | POA: Diagnosis not present

## 2021-06-14 DIAGNOSIS — R911 Solitary pulmonary nodule: Secondary | ICD-10-CM | POA: Diagnosis not present

## 2021-06-14 DIAGNOSIS — J439 Emphysema, unspecified: Secondary | ICD-10-CM | POA: Diagnosis not present

## 2021-06-16 ENCOUNTER — Encounter: Payer: Self-pay | Admitting: Pulmonary Disease

## 2021-06-16 MED ORDER — NICOTINE 10 MG IN INHA: 1.0000 | RESPIRATORY_TRACT | 1 refills | Status: DC | PRN

## 2021-06-16 NOTE — Telephone Encounter (Signed)
Pt states she has tried the patches and due to where she works they tend to come off. She is interested in a 1 month supply of Nicotrol to see if she likes it.

## 2021-06-16 NOTE — Telephone Encounter (Signed)
Dr Patsey Berthold, please advise:   I am trying to quit smoking and found out that my insurance completely covers nicotine replacement therapy is there a way I can get a prescription for that also did my ct scan show up anything

## 2021-06-16 NOTE — Telephone Encounter (Signed)
Would you like me to ask patient to come in & sign? Please advise?

## 2021-06-16 NOTE — Telephone Encounter (Signed)
I reviewed the CT scan that was ordered by Dr. Aundra Dubin.  What we see on the CT scan are very mild changes that are consistent with what we call smokers bronchiolitis.  The treatment for this condition is to quit smoking as continued smoking can then lead to this condition worsening and causing fibrosis.  Right now however the findings are mild.  There are various modes of nicotine replacement therapy that she can have a prescription for the most common one is patches.  There is also a Nicotrol inhaler as well.  She can tell us what she prefers and will be glad to send a prescription for that.

## 2021-06-19 ENCOUNTER — Other Ambulatory Visit: Payer: Self-pay | Admitting: Internal Medicine

## 2021-06-19 DIAGNOSIS — M898X9 Other specified disorders of bone, unspecified site: Secondary | ICD-10-CM

## 2021-06-19 MED ORDER — TRAMADOL HCL 50 MG PO TABS: 50.0000 mg | ORAL_TABLET | Freq: Two times a day (BID) | ORAL | 0 refills | Status: DC | PRN

## 2021-06-19 NOTE — Telephone Encounter (Signed)
Patient informed and verbalized understanding.  States she has hematology appointment sometime in March 2023.

## 2021-06-19 NOTE — Addendum Note (Signed)
Addended by: Orland Mustard on: 06/19/2021 11:17 AM   Modules accepted: Orders

## 2021-06-19 NOTE — Telephone Encounter (Signed)
Inform pt  Sent temporary supply of tramadol but I dont have a cause for her pain so I cant continue to fill beyond this fill  Referral  placed pain clinic for further evaluation of her pain  Also does she have appt at [facility] hematology yet? For low platelets?

## 2021-06-24 ENCOUNTER — Telehealth: Payer: BC Managed Care – PPO | Admitting: Nurse Practitioner

## 2021-06-24 DIAGNOSIS — L239 Allergic contact dermatitis, unspecified cause: Secondary | ICD-10-CM

## 2021-06-24 DIAGNOSIS — R6889 Other general symptoms and signs: Secondary | ICD-10-CM | POA: Diagnosis not present

## 2021-06-24 MED ORDER — PREDNISONE 10 MG PO TABS: 10.0000 mg | ORAL_TABLET | Freq: Every day | ORAL | 0 refills | Status: AC

## 2021-06-24 NOTE — Progress Notes (Signed)
I have spent 5 minutes in review of e-visit questionnaire, review and updating patient chart, medical decision making and response to patient.  ° °Kayleena Eke W Jodye Scali, NP ° °  °

## 2021-06-24 NOTE — Progress Notes (Signed)
E Visit for Rash  We are sorry that you are not feeling well. Here is how we plan to help!  Based on what you shared with me it looks like you have contact dermatitis.  Contact dermatitis is a skin rash caused by something that touches the skin and causes irritation or inflammation.  Your skin may be red, swollen, dry, cracked, and itch.  The rash should go away in a few days but can last a few weeks.  If you get a rash, it's important to figure out what caused it so the irritant can be avoided in the future.  Today I have prescribed for you: Prednisone 10 mg daily for 6 days   HOME CARE:  Take cool showers and avoid direct sunlight. Apply cool compress or wet dressings. Take a bath in an oatmeal bath.  Sprinkle content of one Aveeno packet under running faucet with comfortably warm water.  Bathe for 15-20 minutes, 1-2 times daily.  Pat dry with a towel. Do not rub the rash. Use hydrocortisone cream. Take an antihistamine like Benadryl for widespread rashes that itch.  The adult dose of Benadryl is 25-50 mg by mouth 4 times daily. Caution:  This type of medication may cause sleepiness.  Do not drink alcohol, drive, or operate dangerous machinery while taking antihistamines.  Do not take these medications if you have prostate enlargement.  Read package instructions thoroughly on all medications that you take.  GET HELP RIGHT AWAY IF:  Symptoms don't go away after treatment. Severe itching that persists. If you rash spreads or swells. If you rash begins to smell. If it blisters and opens or develops a yellow-brown crust. You develop a fever. You have a sore throat. You become short of breath.  MAKE SURE YOU:  Understand these instructions. Will watch your condition. Will get help right away if you are not doing well or get worse.  Thank you for choosing an e-visit.  Your e-visit answers were reviewed by a board certified advanced clinical practitioner to complete your personal care  plan. Depending upon the condition, your plan could have included both over the counter or prescription medications.  Please review your pharmacy choice. Make sure the pharmacy is open so you can pick up prescription now. If there is a problem, you may contact your provider through [employer] and have the prescription routed to another pharmacy.  Your safety is important to [place]. If you have drug allergies check your prescription carefully.   For the next 24 hours you can use MyChart to ask questions about today's visit, request a non-urgent call back, or ask for a work or school excuse. You will get an email in the next two days asking about your experience. I hope that your e-visit has been valuable and will speed your recovery.

## 2021-06-25 ENCOUNTER — Other Ambulatory Visit: Payer: Self-pay | Admitting: Internal Medicine

## 2021-06-25 DIAGNOSIS — K219 Gastro-esophageal reflux disease without esophagitis: Secondary | ICD-10-CM

## 2021-06-25 DIAGNOSIS — G4700 Insomnia, unspecified: Secondary | ICD-10-CM

## 2021-06-28 ENCOUNTER — Telehealth: Payer: Self-pay | Admitting: Internal Medicine

## 2021-06-28 NOTE — Telephone Encounter (Signed)
Pain clinic denied referral inform pt they request pt see ortho 1st referred [facility]   Inform please

## 2021-06-28 NOTE — Addendum Note (Signed)
Addended by: Orland Mustard on: 06/28/2021 10:47 AM   Modules accepted: Orders

## 2021-06-29 ENCOUNTER — Other Ambulatory Visit: Payer: Self-pay | Admitting: Pulmonary Disease

## 2021-06-30 ENCOUNTER — Other Ambulatory Visit: Payer: Self-pay

## 2021-06-30 ENCOUNTER — Telehealth (INDEPENDENT_AMBULATORY_CARE_PROVIDER_SITE_OTHER): Payer: BC Managed Care – PPO | Admitting: Internal Medicine

## 2021-06-30 ENCOUNTER — Encounter: Payer: Self-pay | Admitting: Internal Medicine

## 2021-06-30 VITALS — Ht 62.0 in | Wt 192.0 lb

## 2021-06-30 DIAGNOSIS — M25562 Pain in left knee: Secondary | ICD-10-CM

## 2021-06-30 DIAGNOSIS — M25561 Pain in right knee: Secondary | ICD-10-CM | POA: Diagnosis not present

## 2021-06-30 DIAGNOSIS — A0472 Enterocolitis due to Clostridium difficile, not specified as recurrent: Secondary | ICD-10-CM

## 2021-06-30 DIAGNOSIS — M719 Bursopathy, unspecified: Secondary | ICD-10-CM

## 2021-06-30 DIAGNOSIS — Z1231 Encounter for screening mammogram for malignant neoplasm of breast: Secondary | ICD-10-CM

## 2021-06-30 DIAGNOSIS — R197 Diarrhea, unspecified: Secondary | ICD-10-CM

## 2021-06-30 DIAGNOSIS — G8929 Other chronic pain: Secondary | ICD-10-CM

## 2021-06-30 DIAGNOSIS — M722 Plantar fascial fibromatosis: Secondary | ICD-10-CM

## 2021-06-30 DIAGNOSIS — D693 Immune thrombocytopenic purpura: Secondary | ICD-10-CM

## 2021-06-30 DIAGNOSIS — M545 Low back pain, unspecified: Secondary | ICD-10-CM | POA: Diagnosis not present

## 2021-06-30 NOTE — Progress Notes (Signed)
Patient going 5-7 times a day. Feels like diarrhea but not getting it all out. Some days it is all fluid that gushes out and Patient can not hold it. Ongoing for a while now, off and on for 6 months. Stomach is bloated, hard, and food runs right through her.   Color of loose stools has been yellow.

## 2021-07-03 NOTE — Telephone Encounter (Signed)
Left message to return call 

## 2021-07-03 NOTE — Telephone Encounter (Signed)
Copy made and sent to scan. Called and informed Patient that this would be placed at the front desk for pick up. Patient verbalized understanding and states she will be by this afternoon to pick up.

## 2021-07-04 ENCOUNTER — Other Ambulatory Visit
Admission: RE | Admit: 2021-07-04 | Discharge: 2021-07-04 | Disposition: A | Discharge status: Home / Self Care | Payer: BC Managed Care – PPO | Source: Ambulatory Visit | Attending: Internal Medicine | Admitting: Internal Medicine

## 2021-07-04 ENCOUNTER — Inpatient Hospital Stay: Payer: BC Managed Care – PPO

## 2021-07-04 ENCOUNTER — Telehealth: Payer: Self-pay | Admitting: Internal Medicine

## 2021-07-04 ENCOUNTER — Encounter: Payer: Self-pay | Admitting: Internal Medicine

## 2021-07-04 ENCOUNTER — Other Ambulatory Visit: Payer: Self-pay | Admitting: Internal Medicine

## 2021-07-04 ENCOUNTER — Other Ambulatory Visit: Payer: Self-pay

## 2021-07-04 ENCOUNTER — Inpatient Hospital Stay: Payer: BC Managed Care – PPO | Attending: Oncology

## 2021-07-04 DIAGNOSIS — D693 Immune thrombocytopenic purpura: Secondary | ICD-10-CM | POA: Insufficient documentation

## 2021-07-04 DIAGNOSIS — A0472 Enterocolitis due to Clostridium difficile, not specified as recurrent: Secondary | ICD-10-CM

## 2021-07-04 DIAGNOSIS — R197 Diarrhea, unspecified: Secondary | ICD-10-CM | POA: Insufficient documentation

## 2021-07-04 LAB — CBC WITH DIFFERENTIAL/PLATELET
Abs Immature Granulocytes: 0.03 10*3/uL (ref 0.00–0.07)
Basophils Absolute: 0.1 10*3/uL (ref 0.0–0.1)
Basophils Relative: 1 %
Eosinophils Absolute: 0.3 10*3/uL (ref 0.0–0.5)
Eosinophils Relative: 3 %
HCT: 49 % — ABNORMAL HIGH (ref 36.0–46.0)
Hemoglobin: 16.6 g/dL — ABNORMAL HIGH (ref 12.0–15.0)
Immature Granulocytes: 0 %
Lymphocytes Relative: 24 %
Lymphs Abs: 2.5 10*3/uL (ref 0.7–4.0)
MCH: 33.3 pg (ref 26.0–34.0)
MCHC: 33.9 g/dL (ref 30.0–36.0)
MCV: 98.2 fL (ref 80.0–100.0)
Monocytes Absolute: 0.9 10*3/uL (ref 0.1–1.0)
Monocytes Relative: 9 %
Neutro Abs: 6.3 10*3/uL (ref 1.7–7.7)
Neutrophils Relative %: 63 %
Platelets: 38 10*3/uL — ABNORMAL LOW (ref 150–400)
RBC: 4.99 MIL/uL (ref 3.87–5.11)
RDW: 13.2 % (ref 11.5–15.5)
WBC: 10.1 10*3/uL (ref 4.0–10.5)
nRBC: 0 % (ref 0.0–0.2)

## 2021-07-04 LAB — C DIFFICILE QUICK SCREEN W PCR REFLEX
C Diff antigen: POSITIVE — AB
C Diff toxin: NEGATIVE

## 2021-07-04 LAB — CLOSTRIDIUM DIFFICILE BY PCR, REFLEXED: Toxigenic C. Difficile by PCR: POSITIVE — AB

## 2021-07-04 MED ORDER — ROMIPLOSTIM INJECTION 500 MCG
4.0000 ug/kg | Freq: Once | SUBCUTANEOUS | Status: AC
  Administered 2021-07-04: 14:00:00 350 ug via SUBCUTANEOUS
  Filled 2021-07-04: qty 0.5

## 2021-07-04 MED ORDER — VANCOMYCIN HCL 125 MG PO CAPS: 125.0000 mg | ORAL_CAPSULE | Freq: Four times a day (QID) | ORAL | 0 refills | Status: DC

## 2021-07-04 NOTE — Telephone Encounter (Signed)
Pt called in stating that she looked at [facility] for her results. Pt stated that her C Diff came back something wrong and she thinks that Dr. Olivia Mackie want to put her out of work for 7-10 day. Pt is concern. Pt requesting callback as soon as possible

## 2021-07-04 NOTE — Telephone Encounter (Signed)
Patient called back in 07/04/21 and was informed by front desk staff

## 2021-07-05 ENCOUNTER — Encounter: Payer: Self-pay | Admitting: Internal Medicine

## 2021-07-05 NOTE — Telephone Encounter (Signed)
Called and spoke to Patient 07/04/21. See result note

## 2021-07-07 ENCOUNTER — Encounter: Payer: Self-pay | Admitting: Internal Medicine

## 2021-07-07 NOTE — Progress Notes (Signed)
Telephone Note  I connected with Carly [name]  on 06/30/21 at 4 PM EST by telephone and verified that I am speaking with the correct person using two identifiers.  Location patient: home, [place] Location provider:work or home office Persons participating in the virtual visit: patient, provider  I discussed the limitations of evaluation and management by telemedicine and the availability of in person appointments. The patient expressed understanding and agreed to proceed.   HPI: diarrhea Patient going 5-7 times a day. Feels like diarrhea but not getting it all out. Some days it is all fluid that gushes out and Patient can not hold it. Ongoing for a while now, off and on for 6 months. Stomach is bloated, ab cramps, ab is hard, and food runs right through her.Color of loose stools has been yellow.  Around 1 am she has about 4-5 stools   2.Tobacco abuse stopped smoking recently and using nicotrol inhaler and helping 3. C/o msk pain low back pain, feet, knees and feet and plantar fasciitis in heels and h/o bursitis  -[facility] pt wants appt same day as h/o appt for itp this appt is 09/21/21 [facility] 8 or 8:30 am  4. Chronic itp getting platelet infusions Q3 weeks appt [facility] h/o 09/21/21 for 2nd opinion   ROS: See pertinent positives and negatives per HPI.  Past Medical History:  Diagnosis Date   Anxiety    C. difficile diarrhea    07/04/21   Chicken pox    Depression    Elevated testosterone level in female    Emphysema of lung (HCC)    bronchitis   Frequent headaches    Hay fever    Hypertension    Idiopathic thrombocytopenic purpura (ITP) (HCC)    Positive ANA (antinuclear antibody)     Past Surgical History:  Procedure Laterality Date   HEMATOMA EVACUATION N/A 04/07/2020   Procedure: EVACUATION HEMATOMA  AND APPLICATION OF PRESSURE DRESSING;  Surgeon: Benjaman Kindler, MD;  Location: [facility] OR[facility];  Service: Gynecology;  Laterality: N/A;   HYSTEROSCOPY WITH NOVASURE N/A 02/21/2018    Procedure: HYSTEROSCOPY WITH NOVASURE, POLYPECTOMY;  Surgeon: Benjaman Kindler, MD;  Location: [facility];  Service: Gynecology;  Laterality: N/A;   TUBAL LIGATION  1997   TUBAL LIGATION       Current Outpatient Medications:    albuterol (VENTOLIN HFA) 108 (90 Base) MCG/ACT inhaler, INHALE 2 PUFFS INTO THE LUNGS EVERY 6 HOURS AS NEEDED FOR WHEEZING OR SHORTNESS OF BREATH (COUGH), Disp: 6.7 each, Rfl: 3   ALPRAZolam (XANAX) 0.25 MG tablet, Take 1 tablet (0.25 mg total) by mouth daily as needed for anxiety., Disp: 30 tablet, Rfl: 2   amLODipine (NORVASC) 10 MG tablet, Take 1 tablet (10 mg total) by mouth daily., Disp: 90 tablet, Rfl: 3   benzonatate (TESSALON PERLES) 100 MG capsule, Take 1 capsule (100 mg total) by mouth 3 (three) times daily as needed for cough., Disp: 30 capsule, Rfl: 0   Budeson-Glycopyrrol-Formoterol (BREZTRI AEROSPHERE) 160-9-4.8 MCG/ACT AERO, Inhale 2 puffs into the lungs in the morning and at bedtime., Disp: 10.7 g, Rfl: 5   carvedilol (COREG) 25 MG tablet, Take 1 tablet (25 mg total) by mouth 2 (two) times daily with a meal., Disp: 180 tablet, Rfl: 3   cetirizine (ZYRTEC) 10 MG tablet, Take 1 tablet (10 mg total) by mouth daily., Disp: 90 tablet, Rfl: 3   Cholecalciferol 1.25 MG (50000 UT) capsule, Take 1 capsule (50,000 Units total) by mouth every 30 (thirty) days.  1x per month note this, Disp: 13 capsule, Rfl: 0   cyanocobalamin (,VITAMIN B-12,) 1000 MCG/ML injection, INJECT 1 ML (1,000 MCG TOTAL) INTO THE MUSCLE EVERY 30 DAYS., Disp: 1 mL, Rfl: 15   diclofenac Sodium (VOLTAREN) 1 % GEL, Apply 2-4 g topically 4 (four) times daily. 2 grams upper body qid prn and 4 gram lower body qid prn, Disp: 150 g, Rfl: 11   dicyclomine (BENTYL) 10 MG capsule, TAKE 1 CAPSULE (10 MG TOTAL) BY MOUTH 3 (THREE) TIMES DAILY AS NEEDED FOR SPASMS. BEFORE FOOD, Disp: 90 capsule, Rfl: 11   escitalopram (LEXAPRO) 20 MG tablet, TAKE 1 TABLET BY MOUTH EVERY DAY, Disp: 90 tablet, Rfl: 3   nicotine  (NICOTROL) 10 MG inhaler, Inhale 1 Cartridge (1 continuous puffing total) into the lungs as needed for smoking cessation (No more than 16 cartridges per day)., Disp: 36 each, Rfl: 1   omeprazole (PRILOSEC) 20 MG capsule, TAKE 1 CAPSULE BY MOUTH EVERY DAY, Disp: 90 capsule, Rfl: 3   ondansetron (ZOFRAN) 4 MG tablet, TAKE 1 TABLET BY MOUTH EVERY 8 HOURS AS NEEDED FOR NAUSEA AND VOMITING, Disp: 10 tablet, Rfl: 11   spironolactone (ALDACTONE) 25 MG tablet, Take 1 tablet (25 mg total) by mouth daily. In am, Disp: 90 tablet, Rfl: 3   Syringe/Needle, Disp, 25G X 1-1/2" 1 ML MISC, 1 Device by Does not apply route as directed., Disp: 30 each, Rfl: 11   traMADol (ULTRAM) 50 MG tablet, Take 1 tablet (50 mg total) by mouth every 12 (twelve) hours as needed., Disp: 60 tablet, Rfl: 0   traZODone (DESYREL) 50 MG tablet, TAKE 0.5-1 TABLETS BY MOUTH AT BEDTIME AS NEEDED FOR SLEEP., Disp: 90 tablet, Rfl: 3   acetaminophen (TYLENOL) 500 MG tablet, Take 500 mg by mouth every 6 (six) hours as needed. (Patient not taking: Reported on 06/30/2021), Disp: , Rfl:    cephALEXin (KEFLEX) 500 MG capsule, Take 1 capsule (500 mg total) by mouth 2 (two) times daily. (Patient not taking: Reported on 06/13/2021), Disp: 14 capsule, Rfl: 0   cyclobenzaprine (FLEXERIL) 10 MG tablet, Take 1 tablet (10 mg total) by mouth 3 (three) times daily as needed for muscle spasms. (Patient not taking: Reported on 06/30/2021), Disp: 15 tablet, Rfl: 0   ibuprofen (ADVIL) 600 MG tablet, Take 600 mg by mouth every 6 (six) hours as needed. (Patient not taking: Reported on 06/30/2021), Disp: , Rfl:    mirabegron ER (MYRBETRIQ) 25 MG TB24 tablet, Take 1 tablet (25 mg total) by mouth daily. (Patient not taking: Reported on 06/30/2021), Disp: 90 tablet, Rfl: 3   naproxen (NAPROSYN) 500 MG tablet, Take 1 tablet (500 mg total) by mouth 2 (two) times daily with a meal. (Patient not taking: Reported on 06/30/2021), Disp: 20 tablet, Rfl: 0   ondansetron (ZOFRAN  ODT) 4 MG disintegrating tablet, Take 1 tablet (4 mg total) by mouth every 8 (eight) hours as needed for nausea or vomiting. (Patient not taking: Reported on 06/30/2021), Disp: 20 tablet, Rfl: 0   potassium chloride (KLOR-CON) 10 MEQ tablet, Take 1 tablet (10 mEq total) by mouth daily for 5 days. (Patient not taking: Reported on 06/30/2021), Disp: 5 tablet, Rfl: 0   vancomycin (VANCOCIN) 125 MG capsule, Take 1 capsule (125 mg total) by mouth 4 (four) times daily. With food, Disp: 56 capsule, Rfl: 0  EXAM:  VITALS per patient if applicable:  GENERAL: alert, oriented, appears well and in no acute distress   PSYCH/NEURO: pleasant and cooperative, no obvious  depression or anxiety, speech and thought processing grossly intact  ASSESSMENT AND PLAN:  Discussed the following assessment and plan:  Diarrhea, unspecified type - Plan: C Difficile Quick Screen w PCR reflex C. difficile diarrhea + Vancomycin capsules 125 qid x10-14 days   Chronic low back pain, unspecified back pain laterality, unspecified whether sciatica present - Plan: Ambulatory referral to Orthopedic Surgery  Chronic pain of both knees - Plan: Ambulatory referral to Orthopedic Surgery  Plantar fasciitis, bilateral - Plan: Ambulatory referral to Orthopedic Surgery  Bursitis, unspecified site - Plan: Ambulatory referral to Orthopedic Surgery  Chronic ITP (idiopathic thrombocytopenia) (HCC) F/u 09/21/21 [facility] [place]   HM Flu shot utd 2022  tdap utd due in 2026  covid 19 vaccine consider in future will need to be approved Dr. Grayland Ormond due to chronic ITP   Pap overdue sch Dr. Smitty Knudsen change to Dr. WArd s/p ablation and cervical polyps  -she will call [facility] ob/gyn Dr. Leonides Schanz in [place] and get this scheduled due for pap    mammo 07/20/19  neg ordered 2022 [place]    Colonoscopy consider in future [facility] female address low plts 1st   consider in future vs cologaurd too risk to do colonoscopy with chronic ITP  currently    Skin referred [address] ? derm appt sch'ed or had    -we discussed possible serious and likely etiologies, options for evaluation and workup, limitations of telemedicine visit vs in person visit, treatment, treatment risks and precautions. Pt is agreeable to treatment via telemedicine at this moment.     I discussed the assessment and treatment plan with the patient. The patient was provided an opportunity to ask questions and all were answered. The patient agreed with the plan and demonstrated an understanding of the instructions.    Time spent 20 minutes  Delorise Jackson, MD

## 2021-07-11 ENCOUNTER — Other Ambulatory Visit: Payer: Self-pay | Admitting: Pulmonary Disease

## 2021-07-11 DIAGNOSIS — M545 Low back pain, unspecified: Secondary | ICD-10-CM | POA: Insufficient documentation

## 2021-07-11 DIAGNOSIS — M5416 Radiculopathy, lumbar region: Secondary | ICD-10-CM | POA: Insufficient documentation

## 2021-07-11 DIAGNOSIS — A0472 Enterocolitis due to Clostridium difficile, not specified as recurrent: Secondary | ICD-10-CM | POA: Diagnosis not present

## 2021-07-12 ENCOUNTER — Telehealth: Payer: Self-pay | Admitting: Pulmonary Disease

## 2021-07-12 LAB — CBC WITH DIFFERENTIAL/PLATELET
Basophils Absolute: 0.1 10*3/uL (ref 0.0–0.2)
Basos: 1 %
EOS (ABSOLUTE): 0.3 10*3/uL (ref 0.0–0.4)
Eos: 2 %
Hematocrit: 48.6 % — ABNORMAL HIGH (ref 34.0–46.6)
Hemoglobin: 16.6 g/dL — ABNORMAL HIGH (ref 11.1–15.9)
Immature Grans (Abs): 0 10*3/uL (ref 0.0–0.1)
Immature Granulocytes: 0 %
Lymphocytes Absolute: 2.9 10*3/uL (ref 0.7–3.1)
Lymphs: 26 %
MCH: 33.5 pg — ABNORMAL HIGH (ref 26.6–33.0)
MCHC: 34.2 g/dL (ref 31.5–35.7)
MCV: 98 fL — ABNORMAL HIGH (ref 79–97)
Monocytes Absolute: 1 10*3/uL — ABNORMAL HIGH (ref 0.1–0.9)
Monocytes: 9 %
Neutrophils Absolute: 6.8 10*3/uL (ref 1.4–7.0)
Neutrophils: 62 %
Platelets: 165 10*3/uL (ref 150–450)
RBC: 4.96 x10E6/uL (ref 3.77–5.28)
RDW: 12.5 % (ref 11.7–15.4)
WBC: 11 10*3/uL — ABNORMAL HIGH (ref 3.4–10.8)

## 2021-07-12 NOTE — Telephone Encounter (Signed)
Video visit scheduled for 07/13/2021 at 12:00.

## 2021-07-12 NOTE — Telephone Encounter (Signed)
Lm x1 for patient.   Please offer virtual visit with NP.

## 2021-07-13 ENCOUNTER — Encounter: Payer: Self-pay | Admitting: Nurse Practitioner

## 2021-07-13 ENCOUNTER — Other Ambulatory Visit: Payer: Self-pay | Admitting: Pulmonary Disease

## 2021-07-13 ENCOUNTER — Other Ambulatory Visit: Payer: Self-pay

## 2021-07-13 ENCOUNTER — Telehealth: Payer: BC Managed Care – PPO | Admitting: Physician Assistant

## 2021-07-13 ENCOUNTER — Telehealth (INDEPENDENT_AMBULATORY_CARE_PROVIDER_SITE_OTHER): Payer: BC Managed Care – PPO | Admitting: Nurse Practitioner

## 2021-07-13 DIAGNOSIS — J209 Acute bronchitis, unspecified: Secondary | ICD-10-CM | POA: Insufficient documentation

## 2021-07-13 DIAGNOSIS — M549 Dorsalgia, unspecified: Secondary | ICD-10-CM | POA: Diagnosis not present

## 2021-07-13 DIAGNOSIS — M545 Low back pain, unspecified: Secondary | ICD-10-CM

## 2021-07-13 DIAGNOSIS — J440 Chronic obstructive pulmonary disease with acute lower respiratory infection: Secondary | ICD-10-CM | POA: Diagnosis not present

## 2021-07-13 DIAGNOSIS — J069 Acute upper respiratory infection, unspecified: Secondary | ICD-10-CM | POA: Insufficient documentation

## 2021-07-13 DIAGNOSIS — F17210 Nicotine dependence, cigarettes, uncomplicated: Secondary | ICD-10-CM | POA: Diagnosis not present

## 2021-07-13 MED ORDER — PROMETHAZINE-DM 6.25-15 MG/5ML PO SYRP: 5.0000 mL | ORAL_SOLUTION | Freq: Four times a day (QID) | ORAL | 0 refills | Status: DC | PRN

## 2021-07-13 MED ORDER — CYCLOBENZAPRINE HCL 10 MG PO TABS: 10.0000 mg | ORAL_TABLET | Freq: Three times a day (TID) | ORAL | 0 refills | Status: DC | PRN

## 2021-07-13 MED ORDER — GUAIFENESIN ER 600 MG PO TB12: 600.0000 mg | ORAL_TABLET | Freq: Two times a day (BID) | ORAL | 0 refills | Status: DC

## 2021-07-13 MED ORDER — NAPROXEN 500 MG PO TABS: 500.0000 mg | ORAL_TABLET | Freq: Two times a day (BID) | ORAL | 0 refills | Status: DC

## 2021-07-13 MED ORDER — PREDNISONE 10 MG PO TABS: ORAL_TABLET | ORAL | 0 refills | Status: DC

## 2021-07-13 MED ORDER — FLUTICASONE PROPIONATE 50 MCG/ACT NA SUSP: 1.0000 | Freq: Every day | NASAL | 2 refills | Status: DC

## 2021-07-13 NOTE — Progress Notes (Signed)
I have spent 5 minutes in review of e-visit questionnaire, review and updating patient chart, medical decision making and response to patient.   Dung Salinger Cody Meer Reindl, PA-C    

## 2021-07-13 NOTE — Progress Notes (Signed)

## 2021-07-13 NOTE — Patient Instructions (Addendum)
-  Continue Breztri 2 puffs Twice daily, brush tongue and rinse mouth afterwards -Continue Albuterol inhaler 2 puffs every 6 hours as needed for shortness of breath or wheezing. Notify if symptoms persist despite rescue inhaler/neb use. -Continue Zyrtec 10 mg daily -Continue omeprazole 20 mg daily   -Prednisone taper pack. 4 tabs for 2 days, then 3 tabs for 2 days, 2 tabs for 2 days, then 1 tab for 2 days, then stop. Take in AM with food. -Flonase nasal spray 1-2 sprays each nostril daily for congestion -Mucinex 600 mg Twice daily for congestion -Promethazine-DM cough syrup 5 mL every 6 hours as needed for cough -Saline nasal spray 2-3 times a day  Notify if worsening breathlessness, cough, mucus production, fatigue, or wheezing occurs.  Maintain up to date vaccinations, including influenza, COVID, and pneumococcal.  Wash your hands often and avoid sick exposures.  Encouraged masking in crowds.  Smoking cessation strongly encouraged and discussed.  Avoid triggers, when possible.  Exercise, as tolerated. Notify if worsening symptoms upon exertion occur.    Follow up in 1 month with Dr. Patsey Berthold or APP. If symptoms do not improve or worsen, please contact office for sooner follow up or seek emergency care.

## 2021-07-13 NOTE — Assessment & Plan Note (Signed)
Increased nonproductive cough and SOB. Likely viral etiology. Currently on vancomycin for treatment of c diff. Will tx with prednisone taper pack. Promethazine/DM cough syrup for nighttime cough. Continue breztri Twice daily and albuterol as needed.  Patient Instructions  -Continue Breztri 2 puffs Twice daily, brush tongue and rinse mouth afterwards -Continue Albuterol inhaler 2 puffs every 6 hours as needed for shortness of breath or wheezing. Notify if symptoms persist despite rescue inhaler/neb use. -Continue Zyrtec 10 mg daily -Continue omeprazole 20 mg daily   -Prednisone taper pack. 4 tabs for 2 days, then 3 tabs for 2 days, 2 tabs for 2 days, then 1 tab for 2 days, then stop. Take in AM with food. -Flonase nasal spray 1-2 sprays each nostril daily for congestion -Mucinex 600 mg Twice daily for congestion -Promethazine-DM cough syrup 5 mL every 6 hours as needed for cough -Saline nasal spray 2-3 times a day  Notify if worsening breathlessness, cough, mucus production, fatigue, or wheezing occurs.  Maintain up to date vaccinations, including influenza, COVID, and pneumococcal.  Wash your hands often and avoid sick exposures.  Encouraged masking in crowds.  Smoking cessation strongly encouraged and discussed.  Avoid triggers, when possible.  Exercise, as tolerated. Notify if worsening symptoms upon exertion occur.    Follow up in 1 month with Dr. Patsey Berthold or APP. If symptoms do not improve or worsen, please contact office for sooner follow up or seek emergency care.

## 2021-07-13 NOTE — Progress Notes (Signed)
Patient ID: Carly [name], female     DOB: [birth year], 50 y.o.      MRN: [id number]  Chief Complaint  Patient presents with   Follow-up    Patient reports that she is having some wheezing and she has non productive cough,. X 3-4 days. She has increased shortness of breath with exertion.     Virtual Visit via Video Note  I connected with Carly [name] on 07/13/21 at 12:00 PM EST by a video enabled telemedicine application and verified that I am speaking with the correct person using two identifiers.  Location: Patient: Work Provider: Office   I discussed the limitations of evaluation and management by telemedicine and the availability of in person appointments. The patient expressed understanding and agreed to proceed.  History of Present Illness: 50 year old female, current everyday smoker followed for asthma COPD overlap syndrome.  She is a patient of Dr. Patsey [name] and was last seen on 06/06/2021 in office.  Past medical history significant for GERD, HLD, fatty liver, chronic ITP, osteoarthritis, overactive bladder, B12 deficiency, elevated LFTs, depression, obesity, fatigue, generalized anxiety disorder, hypertension.   06/06/2021: OV with Dr. Patsey [name].  Changed from Trelegy to [facility].  Advised to quit smoking with smoking cessation discussed.  Previously had a positive ANA which was of questionable significance no overt signs and symptoms of connective tissue disorder.  07/13/2021: Today - acute sick visit Patient presents today for reported increased cough and shortness of breath. Her symptoms started 3-4 days ago and have persisted since. She describes her cough as non-productive. Her shortness of breath is upon exertion and she has noticed an intermittent wheeze. She also has some URI symptoms including nasal congestion and clear rhinorrhea. She has not checked her oxygen level at home. She denies any fevers, chills, or recent sick exposures. She has not had any orthopnea, PND, chest  pain or lower extremity swelling. Her cough is worse at night and she has been taking tessalon perles, but they have not helped and so she has been waking frequently with coughing fits. She is using her albuterol inhaler 1-2 times a day. She continues on her Breztri inhaler twice a day and Zyrtec. She has not had any recent GERD symptoms and reports it's well controlled on omeprazole.   Allergies  Allergen Reactions   Meloxicam Nausea Only   Immunization History  Administered Date(s) Administered   Influenza,inj,Quad PF,6+ Mos 08/23/2014   Influenza-Unspecified 04/16/2019, 04/26/2021   Tdap 08/23/2014   Past Medical History:  Diagnosis Date   Anxiety    C. difficile diarrhea    07/04/21   Chicken pox    Depression    Elevated testosterone level in female    Emphysema of lung (HCC)    bronchitis   Frequent headaches    Hay fever    Hypertension    Idiopathic thrombocytopenic purpura (ITP) (HCC)    Positive ANA (antinuclear antibody)     Tobacco History: Social History   Tobacco Use  Smoking Status Some Days   Packs/day: 1.50   Years: 28.00   Pack years: 42.00   Types: Cigarettes  Smokeless Tobacco Never  Tobacco Comments   Patient reports 3-4 PPD.   Ready to quit: Not Answered Counseling given: Not Answered Tobacco comments: Patient reports 3-4 PPD.   Outpatient Medications Prior to Visit  Medication Sig Dispense Refill   acetaminophen (TYLENOL) 500 MG tablet Take 500 mg by mouth every 6 (six) hours as needed.     albuterol (VENTOLIN  HFA) 108 (90 Base) MCG/ACT inhaler INHALE 2 PUFFS INTO THE LUNGS EVERY 6 HOURS AS NEEDED FOR WHEEZING OR SHORTNESS OF BREATH (COUGH) 6.7 each 3   ALPRAZolam (XANAX) 0.25 MG tablet Take 1 tablet (0.25 mg total) by mouth daily as needed for anxiety. 30 tablet 2   amLODipine (NORVASC) 10 MG tablet Take 1 tablet (10 mg total) by mouth daily. 90 tablet 3   benzonatate (TESSALON PERLES) 100 MG capsule Take 1 capsule (100 mg total) by mouth 3  (three) times daily as needed for cough. 30 capsule 0   Budeson-Glycopyrrol-Formoterol (BREZTRI AEROSPHERE) 160-9-4.8 MCG/ACT AERO Inhale 2 puffs into the lungs in the morning and at bedtime. 10.7 g 5   carvedilol (COREG) 25 MG tablet Take 1 tablet (25 mg total) by mouth 2 (two) times daily with a meal. 180 tablet 3   cetirizine (ZYRTEC) 10 MG tablet Take 1 tablet (10 mg total) by mouth daily. 90 tablet 3   Cholecalciferol 1.25 MG (50000 UT) capsule Take 1 capsule (50,000 Units total) by mouth every 30 (thirty) days. 1x per month note this 13 capsule 0   cyanocobalamin (,VITAMIN B-12,) 1000 MCG/ML injection INJECT 1 ML (1,000 MCG TOTAL) INTO THE MUSCLE EVERY 30 DAYS. 1 mL 15   cyclobenzaprine (FLEXERIL) 10 MG tablet Take 1 tablet (10 mg total) by mouth 3 (three) times daily as needed for muscle spasms. 15 tablet 0   diclofenac Sodium (VOLTAREN) 1 % GEL Apply 2-4 g topically 4 (four) times daily. 2 grams upper body qid prn and 4 gram lower body qid prn 150 g 11   dicyclomine (BENTYL) 10 MG capsule TAKE 1 CAPSULE (10 MG TOTAL) BY MOUTH 3 (THREE) TIMES DAILY AS NEEDED FOR SPASMS. BEFORE FOOD 90 capsule 11   escitalopram (LEXAPRO) 20 MG tablet TAKE 1 TABLET BY MOUTH EVERY DAY 90 tablet 3   naproxen (NAPROSYN) 500 MG tablet Take 1 tablet (500 mg total) by mouth 2 (two) times daily with a meal. 20 tablet 0   nicotine (NICOTROL) 10 MG inhaler Inhale 1 Cartridge (1 continuous puffing total) into the lungs as needed for smoking cessation (No more than 16 cartridges per day). 36 each 1   omeprazole (PRILOSEC) 20 MG capsule TAKE 1 CAPSULE BY MOUTH EVERY DAY 90 capsule 3   ondansetron (ZOFRAN) 4 MG tablet TAKE 1 TABLET BY MOUTH EVERY 8 HOURS AS NEEDED FOR NAUSEA AND VOMITING 10 tablet 11   spironolactone (ALDACTONE) 25 MG tablet Take 1 tablet (25 mg total) by mouth daily. In am 90 tablet 3   Syringe/Needle, Disp, 25G X 1-1/2" 1 ML MISC 1 Device by Does not apply route as directed. 30 each 11   traMADol (ULTRAM)  50 MG tablet Take 1 tablet (50 mg total) by mouth every 12 (twelve) hours as needed. 60 tablet 0   traZODone (DESYREL) 50 MG tablet TAKE 0.5-1 TABLETS BY MOUTH AT BEDTIME AS NEEDED FOR SLEEP. 90 tablet 3   vancomycin (VANCOCIN) 125 MG capsule Take 1 capsule (125 mg total) by mouth 4 (four) times daily. With food 56 capsule 0   cephALEXin (KEFLEX) 500 MG capsule Take 1 capsule (500 mg total) by mouth 2 (two) times daily. (Patient not taking: Reported on 06/13/2021) 14 capsule 0   ibuprofen (ADVIL) 600 MG tablet Take 600 mg by mouth every 6 (six) hours as needed. (Patient not taking: Reported on 06/30/2021)     mirabegron ER (MYRBETRIQ) 25 MG TB24 tablet Take 1 tablet (25 mg total) by  mouth daily. (Patient not taking: Reported on 06/30/2021) 90 tablet 3   ondansetron (ZOFRAN ODT) 4 MG disintegrating tablet Take 1 tablet (4 mg total) by mouth every 8 (eight) hours as needed for nausea or vomiting. (Patient not taking: Reported on 06/30/2021) 20 tablet 0   potassium chloride (KLOR-CON) 10 MEQ tablet Take 1 tablet (10 mEq total) by mouth daily for 5 days. (Patient not taking: Reported on 06/30/2021) 5 tablet 0   No facility-administered medications prior to visit.     Review of Systems:   Constitutional: No weight loss or gain, night sweats, fevers, chills, fatigue, or lassitude. HEENT: No headaches, difficulty swallowing, tooth/dental problems, or sore throat. No sneezing, itching, ear ache. +nasal congestion, clear rhinorrhea CV:  No chest pain, orthopnea, PND, swelling in lower extremities, anasarca, dizziness, palpitations, syncope Resp: +shortness of breath with exertion; non-productive cough; wheeze. No excess mucus or change in color of mucus.  No hemoptysis. No chest wall deformity GI:  +diagnosed with c diff last week - currently being treated with vanc. No heartburn, indigestion, abdominal pain, nausea, vomiting, change in bowel habits, loss of appetite, bloody stools.  GU: No dysuria, change  in color of urine, urgency or frequency.  No flank pain, no hematuria  Skin: No rash, lesions, ulcerations MSK:  No joint pain or swelling.  No decreased range of motion.  No back pain. Neuro: No dizziness or lightheadedness.  Psych: No depression or anxiety. Mood stable.   Observations/Objective: Patient is well-developed in no acute distress; A&Ox3. Unlabored, regular breathing. Speech is clear and coherent with logical content. No cough noted during visit. Currently at her work place and able to perform her job duties as usual.   Assessment and Plan: Acute bronchitis with COPD ([place]) Increased nonproductive cough and SOB. Likely viral etiology. Currently on vancomycin for treatment of c diff. Will tx with prednisone taper pack. Promethazine/DM cough syrup for nighttime cough. Continue breztri Twice daily and albuterol as needed.  Patient Instructions  -Continue Breztri 2 puffs Twice daily, brush tongue and rinse mouth afterwards -Continue Albuterol inhaler 2 puffs every 6 hours as needed for shortness of breath or wheezing. Notify if symptoms persist despite rescue inhaler/neb use. -Continue Zyrtec 10 mg daily -Continue omeprazole 20 mg daily   -Prednisone taper pack. 4 tabs for 2 days, then 3 tabs for 2 days, 2 tabs for 2 days, then 1 tab for 2 days, then stop. Take in AM with food. -Flonase nasal spray 1-2 sprays each nostril daily for congestion -Mucinex 600 mg Twice daily for congestion -Promethazine-DM cough syrup 5 mL every 6 hours as needed for cough -Saline nasal spray 2-3 times a day  Notify if worsening breathlessness, cough, mucus production, fatigue, or wheezing occurs.  Maintain up to date vaccinations, including influenza, COVID, and pneumococcal.  Wash your hands often and avoid sick exposures.  Encouraged masking in crowds.  Smoking cessation strongly encouraged and discussed.  Avoid triggers, when possible.  Exercise, as tolerated. Notify if worsening symptoms upon  exertion occur.    Follow up in 1 month with Dr. Patsey [name] or APP. If symptoms do not improve or worsen, please contact office for sooner follow up or seek emergency care.   Tobacco use disorder Smoking cessation strongly advised. Has cut back and is doing the nicotine inhaler but continues to smoke a few cigarettes a day.  Upper respiratory infection Likely viral etiology. Supportive care measures. See above.    Follow Up Instructions: Follow up in one month with  Dr Carly [name]. If symptoms do not improve or worsen, please contact office for sooner follow up or seek emergency care.   I discussed the assessment and treatment plan with the patient. The patient was provided an opportunity to ask questions and all were answered. The patient agreed with the plan and demonstrated an understanding of the instructions.   The patient was advised to call back or seek an in-person evaluation if the symptoms worsen or if the condition fails to improve as anticipated.  I provided 21 minutes of non-face-to-face time during this encounter.   Clayton Bibles, NP

## 2021-07-13 NOTE — Assessment & Plan Note (Addendum)
Smoking cessation strongly advised. Has cut back and is doing the nicotine inhaler but continues to smoke a few cigarettes a day.

## 2021-07-13 NOTE — Assessment & Plan Note (Signed)
Likely viral etiology. Supportive care measures. See above.

## 2021-07-14 DIAGNOSIS — M545 Low back pain, unspecified: Secondary | ICD-10-CM | POA: Diagnosis not present

## 2021-07-15 ENCOUNTER — Telehealth: Payer: BC Managed Care – PPO | Admitting: Physician Assistant

## 2021-07-15 DIAGNOSIS — J441 Chronic obstructive pulmonary disease with (acute) exacerbation: Secondary | ICD-10-CM | POA: Diagnosis not present

## 2021-07-15 MED ORDER — ALBUTEROL SULFATE (2.5 MG/3ML) 0.083% IN NEBU: 2.5000 mg | INHALATION_SOLUTION | Freq: Four times a day (QID) | RESPIRATORY_TRACT | 1 refills | Status: DC | PRN

## 2021-07-15 NOTE — Patient Instructions (Signed)
Carly [name], thank you for joining Leeanne Rio, PA-C for today's virtual visit.  While this provider is not your primary care provider (PCP), if your PCP is located in our provider database this encounter information will be shared with them immediately following your visit.  Consent: (Patient) Carly [name] provided verbal consent for this virtual visit at the beginning of the encounter.  Current Medications:  Current Outpatient Medications:    albuterol (PROVENTIL) (2.5 MG/3ML) 0.083% nebulizer solution, Take 3 mLs (2.5 mg total) by nebulization every 6 (six) hours as needed for wheezing or shortness of breath., Disp: 150 mL, Rfl: 1   acetaminophen (TYLENOL) 500 MG tablet, Take 500 mg by mouth every 6 (six) hours as needed., Disp: , Rfl:    albuterol (VENTOLIN HFA) 108 (90 Base) MCG/ACT inhaler, INHALE 2 PUFFS INTO THE LUNGS EVERY 6 HOURS AS NEEDED FOR WHEEZING OR SHORTNESS OF BREATH (COUGH), Disp: 6.7 each, Rfl: 3   ALPRAZolam (XANAX) 0.25 MG tablet, Take 1 tablet (0.25 mg total) by mouth daily as needed for anxiety., Disp: 30 tablet, Rfl: 2   amLODipine (NORVASC) 10 MG tablet, Take 1 tablet (10 mg total) by mouth daily., Disp: 90 tablet, Rfl: 3   benzonatate (TESSALON PERLES) 100 MG capsule, Take 1 capsule (100 mg total) by mouth 3 (three) times daily as needed for cough., Disp: 30 capsule, Rfl: 0   Budeson-Glycopyrrol-Formoterol (BREZTRI AEROSPHERE) 160-9-4.8 MCG/ACT AERO, Inhale 2 puffs into the lungs in the morning and at bedtime., Disp: 10.7 g, Rfl: 5   carvedilol (COREG) 25 MG tablet, Take 1 tablet (25 mg total) by mouth 2 (two) times daily with a meal., Disp: 180 tablet, Rfl: 3   cetirizine (ZYRTEC) 10 MG tablet, Take 1 tablet (10 mg total) by mouth daily., Disp: 90 tablet, Rfl: 3   Cholecalciferol 1.25 MG (50000 UT) capsule, Take 1 capsule (50,000 Units total) by mouth every 30 (thirty) days. 1x per month note this, Disp: 13 capsule, Rfl: 0   cyanocobalamin (,VITAMIN B-12,)  1000 MCG/ML injection, INJECT 1 ML (1,000 MCG TOTAL) INTO THE MUSCLE EVERY 30 DAYS., Disp: 1 mL, Rfl: 15   cyclobenzaprine (FLEXERIL) 10 MG tablet, Take 1 tablet (10 mg total) by mouth 3 (three) times daily as needed for muscle spasms., Disp: 15 tablet, Rfl: 0   diclofenac Sodium (VOLTAREN) 1 % GEL, Apply 2-4 g topically 4 (four) times daily. 2 grams upper body qid prn and 4 gram lower body qid prn, Disp: 150 g, Rfl: 11   dicyclomine (BENTYL) 10 MG capsule, TAKE 1 CAPSULE (10 MG TOTAL) BY MOUTH 3 (THREE) TIMES DAILY AS NEEDED FOR SPASMS. BEFORE FOOD, Disp: 90 capsule, Rfl: 11   escitalopram (LEXAPRO) 20 MG tablet, TAKE 1 TABLET BY MOUTH EVERY DAY, Disp: 90 tablet, Rfl: 3   fluticasone (FLONASE) 50 MCG/ACT nasal spray, Place 1 spray into both nostrils daily., Disp: 18.2 mL, Rfl: 2   guaiFENesin (MUCINEX) 600 MG 12 hr tablet, Take 1 tablet (600 mg total) by mouth 2 (two) times daily., Disp: 60 tablet, Rfl: 0   naproxen (NAPROSYN) 500 MG tablet, Take 1 tablet (500 mg total) by mouth 2 (two) times daily with a meal., Disp: 20 tablet, Rfl: 0   nicotine (NICOTROL) 10 MG inhaler, Inhale 1 Cartridge (1 continuous puffing total) into the lungs as needed for smoking cessation (No more than 16 cartridges per day)., Disp: 36 each, Rfl: 1   omeprazole (PRILOSEC) 20 MG capsule, TAKE 1 CAPSULE BY MOUTH EVERY DAY,  Disp: 90 capsule, Rfl: 3   ondansetron (ZOFRAN) 4 MG tablet, TAKE 1 TABLET BY MOUTH EVERY 8 HOURS AS NEEDED FOR NAUSEA AND VOMITING, Disp: 10 tablet, Rfl: 11   predniSONE (DELTASONE) 10 MG tablet, 4 tabs for 2 days, then 3 tabs for 2 days, 2 tabs for 2 days, then 1 tab for 2 days, then stop, Disp: 20 tablet, Rfl: 0   promethazine-dextromethorphan (PROMETHAZINE-DM) 6.25-15 MG/5ML syrup, Take 5 mLs by mouth 4 (four) times daily as needed for cough., Disp: 118 mL, Rfl: 0   spironolactone (ALDACTONE) 25 MG tablet, Take 1 tablet (25 mg total) by mouth daily. In am, Disp: 90 tablet, Rfl: 3   Syringe/Needle, Disp,  25G X 1-1/2" 1 ML MISC, 1 Device by Does not apply route as directed., Disp: 30 each, Rfl: 11   traMADol (ULTRAM) 50 MG tablet, Take 1 tablet (50 mg total) by mouth every 12 (twelve) hours as needed., Disp: 60 tablet, Rfl: 0   traZODone (DESYREL) 50 MG tablet, TAKE 0.5-1 TABLETS BY MOUTH AT BEDTIME AS NEEDED FOR SLEEP., Disp: 90 tablet, Rfl: 3   vancomycin (VANCOCIN) 125 MG capsule, Take 1 capsule (125 mg total) by mouth 4 (four) times daily. With food, Disp: 56 capsule, Rfl: 0   Medications ordered in this encounter:  Meds ordered this encounter  Medications   albuterol (PROVENTIL) (2.5 MG/3ML) 0.083% nebulizer solution    Sig: Take 3 mLs (2.5 mg total) by nebulization every 6 (six) hours as needed for wheezing or shortness of breath.    Dispense:  150 mL    Refill:  1    Order Specific Question:   Supervising Provider    Answer:   Sabra Heck, BRIAN [[id number]]     *If you need refills on other medications prior to your next appointment, please contact your pharmacy*  Follow-Up: Call back or seek an in-person evaluation if the symptoms worsen or if the condition fails to improve as anticipated.  Other Instructions   If you have been instructed to have an in-person evaluation today at a local Urgent Care facility, please use the link below. It will take you to a list of all of our available [facility], including address, phone number and hours of operation. Please do not delay care.  [facility]  If you or a family member do not have a primary care provider, use the link below to schedule a visit and establish care. When you choose a [facility] primary care physician or advanced practice provider, you gain a long-term partner in health. Find a Primary Care Provider  Learn more about [facility]'s in-office and virtual care options: [facility]

## 2021-07-15 NOTE — Progress Notes (Signed)
Virtual Visit Consent   Carly [name], you are scheduled for a virtual visit with a [facility] provider today.     Just as with appointments in the office, your consent must be obtained to participate.  Your consent will be active for this visit and any virtual visit you may have with one of our providers in the next 365 days.     If you have a MyChart account, a copy of this consent can be sent to you electronically.  All virtual visits are billed to your insurance company just like a traditional visit in the office.    As this is a virtual visit, video technology does not allow for your provider to perform a traditional examination.  This may limit your provider's ability to fully assess your condition.  If your provider identifies any concerns that need to be evaluated in person or the need to arrange testing (such as labs, EKG, etc.), we will make arrangements to do so.     Although advances in technology are sophisticated, we cannot ensure that it will always work on either your end or our end.  If the connection with a video visit is poor, the visit may have to be switched to a telephone visit.  With either a video or telephone visit, we are not always able to ensure that we have a secure connection.     I need to obtain your verbal consent now.   Are you willing to proceed with your visit today?    [name] has provided verbal consent on 07/15/2021 for a virtual visit (video or telephone).   Carly [name], [place]   Date: 07/15/2021 2:41 PM   Virtual Visit via Video Note   I, Carly [name], [name]  ([id number], 08/18/1970) on 07/15/21 at  2:30 PM EST by a video-enabled telemedicine application and verified that I am speaking with the correct person using two identifiers.  Location: Patient: Virtual Visit Location Patient: Home Provider: Virtual Visit Location Provider: Home Office   I discussed the limitations of evaluation and management by  telemedicine and the availability of in person appointments. The patient expressed understanding and agreed to proceed.    History of Present Illness: Carly [name] is a 50 y.o. who identifies as a female who was assigned female at birth, and is being seen today for request of medication refill. Patient with history of COPD with recent exacerbation secondary to viral URI. Was evaluated by Pulmonology on 07/13/2021 and started on cough medication and steroid taper. Notes taking medications as directed with improvement but she is out of her albuterol nebulizer solution and she cannot get in touch with her specialist office due to the holiday. She notes O2 saturations at at least 95% RA. Denies any new or worsening symptoms.  HPI: HPI  Problems:  Patient Active Problem List   Diagnosis Date Noted   Acute bronchitis with COPD ([place]) 07/13/2021   Upper respiratory infection 07/13/2021   C. difficile diarrhea 07/04/2021   Gastroesophageal reflux disease without esophagitis 07/01/2020   Hyperlipidemia 07/01/2020   Posttraumatic vulvar hematoma 04/07/2020   Dizziness 03/29/2020   Fatty liver 12/24/2019   Angiolipoma of left kidney 12/24/2019   Chronic ITP (idiopathic thrombocytopenia) (HCC) 12/24/2019   Chronic ITP (idiopathic thrombocytopenic purpura) ([place]) 12/02/2019   Idiopathic thrombocytopenic purpura (ITP) ([place]) 11/10/2019   Verruca vulgaris 09/29/2019   Plantar fasciitis, bilateral 09/29/2019   Osteoarthritis of both knees 09/29/2019  Overactive bladder 07/14/2019   B12 deficiency 06/10/2019   Vitamin D deficiency 06/10/2019   Headache 01/30/2019   Elevated LFTs 01/30/2019   Abnormal uterine bleeding (AUB) 08/26/2018   Depression, major, single episode, moderate ([place]) 08/04/2018   Iron deficiency 09/02/2017   Menorrhagia with regular cycle 10/22/2016   Insomnia 08/15/2016   Nocturia 06/18/2016   Thrombocytopenia ([place]) 02/12/2016   COPD exacerbation ([place]) 01/31/2016   Carpal  tunnel syndrome 01/20/2016   Lateral epicondylitis 01/20/2016   Obesity 01/04/2016   Palpitations 12/15/2015   Myalgia 10/31/2015   Fatigue 10/31/2015   Mixed incontinence 03/09/2015   COPD (chronic obstructive pulmonary disease) ([place]) 01/13/2015   Acute bronchitis 11/15/2014   Female hirsutism 09/22/2014   Elevated testosterone level in female 09/22/2014   Generalized anxiety disorder 08/23/2014   Tobacco use disorder 08/23/2014   Obesity (BMI 30-39.9) 08/23/2014   HTN (hypertension) 08/23/2014    Allergies:  Allergies  Allergen Reactions   Meloxicam Nausea Only   Medications:  Current Outpatient Medications:    albuterol (PROVENTIL) (2.5 MG/3ML) 0.083% nebulizer solution, Take 3 mLs (2.5 mg total) by nebulization every 6 (six) hours as needed for wheezing or shortness of breath., Disp: 150 mL, Rfl: 1   acetaminophen (TYLENOL) 500 MG tablet, Take 500 mg by mouth every 6 (six) hours as needed., Disp: , Rfl:    albuterol (VENTOLIN HFA) 108 (90 Base) MCG/ACT inhaler, INHALE 2 PUFFS INTO THE LUNGS EVERY 6 HOURS AS NEEDED FOR WHEEZING OR SHORTNESS OF BREATH (COUGH), Disp: 6.7 each, Rfl: 3   ALPRAZolam (XANAX) 0.25 MG tablet, Take 1 tablet (0.25 mg total) by mouth daily as needed for anxiety., Disp: 30 tablet, Rfl: 2   amLODipine (NORVASC) 10 MG tablet, Take 1 tablet (10 mg total) by mouth daily., Disp: 90 tablet, Rfl: 3   benzonatate (TESSALON PERLES) 100 MG capsule, Take 1 capsule (100 mg total) by mouth 3 (three) times daily as needed for cough., Disp: 30 capsule, Rfl: 0   Budeson-Glycopyrrol-Formoterol (BREZTRI AEROSPHERE) 160-9-4.8 MCG/ACT AERO, Inhale 2 puffs into the lungs in the morning and at bedtime., Disp: 10.7 g, Rfl: 5   carvedilol (COREG) 25 MG tablet, Take 1 tablet (25 mg total) by mouth 2 (two) times daily with a meal., Disp: 180 tablet, Rfl: 3   cetirizine (ZYRTEC) 10 MG tablet, Take 1 tablet (10 mg total) by mouth daily., Disp: 90 tablet, Rfl: 3   Cholecalciferol 1.25 MG  (50000 UT) capsule, Take 1 capsule (50,000 Units total) by mouth every 30 (thirty) days. 1x per month note this, Disp: 13 capsule, Rfl: 0   cyanocobalamin (,VITAMIN B-12,) 1000 MCG/ML injection, INJECT 1 ML (1,000 MCG TOTAL) INTO THE MUSCLE EVERY 30 DAYS., Disp: 1 mL, Rfl: 15   cyclobenzaprine (FLEXERIL) 10 MG tablet, Take 1 tablet (10 mg total) by mouth 3 (three) times daily as needed for muscle spasms., Disp: 15 tablet, Rfl: 0   diclofenac Sodium (VOLTAREN) 1 % GEL, Apply 2-4 g topically 4 (four) times daily. 2 grams upper body qid prn and 4 gram lower body qid prn, Disp: 150 g, Rfl: 11   dicyclomine (BENTYL) 10 MG capsule, TAKE 1 CAPSULE (10 MG TOTAL) BY MOUTH 3 (THREE) TIMES DAILY AS NEEDED FOR SPASMS. BEFORE FOOD, Disp: 90 capsule, Rfl: 11   escitalopram (LEXAPRO) 20 MG tablet, TAKE 1 TABLET BY MOUTH EVERY DAY, Disp: 90 tablet, Rfl: 3   fluticasone (FLONASE) 50 MCG/ACT nasal spray, Place 1 spray into both nostrils daily., Disp: 18.2 mL, Rfl: 2  guaiFENesin (MUCINEX) 600 MG 12 hr tablet, Take 1 tablet (600 mg total) by mouth 2 (two) times daily., Disp: 60 tablet, Rfl: 0   naproxen (NAPROSYN) 500 MG tablet, Take 1 tablet (500 mg total) by mouth 2 (two) times daily with a meal., Disp: 20 tablet, Rfl: 0   nicotine (NICOTROL) 10 MG inhaler, Inhale 1 Cartridge (1 continuous puffing total) into the lungs as needed for smoking cessation (No more than 16 cartridges per day)., Disp: 36 each, Rfl: 1   omeprazole (PRILOSEC) 20 MG capsule, TAKE 1 CAPSULE BY MOUTH EVERY DAY, Disp: 90 capsule, Rfl: 3   ondansetron (ZOFRAN) 4 MG tablet, TAKE 1 TABLET BY MOUTH EVERY 8 HOURS AS NEEDED FOR NAUSEA AND VOMITING, Disp: 10 tablet, Rfl: 11   predniSONE (DELTASONE) 10 MG tablet, 4 tabs for 2 days, then 3 tabs for 2 days, 2 tabs for 2 days, then 1 tab for 2 days, then stop, Disp: 20 tablet, Rfl: 0   promethazine-dextromethorphan (PROMETHAZINE-DM) 6.25-15 MG/5ML syrup, Take 5 mLs by mouth 4 (four) times daily as needed for  cough., Disp: 118 mL, Rfl: 0   spironolactone (ALDACTONE) 25 MG tablet, Take 1 tablet (25 mg total) by mouth daily. In am, Disp: 90 tablet, Rfl: 3   Syringe/Needle, Disp, 25G X 1-1/2" 1 ML MISC, 1 Device by Does not apply route as directed., Disp: 30 each, Rfl: 11   traMADol (ULTRAM) 50 MG tablet, Take 1 tablet (50 mg total) by mouth every 12 (twelve) hours as needed., Disp: 60 tablet, Rfl: 0   traZODone (DESYREL) 50 MG tablet, TAKE 0.5-1 TABLETS BY MOUTH AT BEDTIME AS NEEDED FOR SLEEP., Disp: 90 tablet, Rfl: 3   vancomycin (VANCOCIN) 125 MG capsule, Take 1 capsule (125 mg total) by mouth 4 (four) times daily. With food, Disp: 56 capsule, Rfl: 0  Observations/Objective: Patient is well-developed, well-nourished in no acute distress.  Resting comfortably at home.  Head is normocephalic, atraumatic.  No labored breathing. Speech is clear and coherent with logical content.  Patient is alert and oriented at baseline.   Assessment and Plan: 1. COPD exacerbation (HCC) - albuterol (PROVENTIL) (2.5 MG/3ML) 0.083% nebulizer solution; Take 3 mLs (2.5 mg total) by nebulization every 6 (six) hours as needed for wheezing or shortness of breath.  Dispense: 150 mL; Refill: 1  She is improving overall. Will have her continue care plan from her Pulmonologist. Refill of her albuterol neb solution sent in. Strict follow-up precautions discussed.   Follow Up Instructions: I discussed the assessment and treatment plan with the patient. The patient was provided an opportunity to ask questions and all were answered. The patient agreed with the plan and demonstrated an understanding of the instructions.  A copy of instructions were sent to the patient via MyChart unless otherwise noted below.   The patient was advised to call back or seek an in-person evaluation if the symptoms worsen or if the condition fails to improve as anticipated.  Time:  I spent 7 minutes with the patient via telehealth technology  discussing the above problems/concerns.    Carly Rio, PA-C

## 2021-07-18 ENCOUNTER — Encounter: Payer: Self-pay | Admitting: Pulmonary Disease

## 2021-07-18 MED ORDER — MOLNUPIRAVIR 200 MG PO CAPS: 4.0000 | ORAL_CAPSULE | Freq: Two times a day (BID) | ORAL | 0 refills | Status: AC

## 2021-07-18 NOTE — Telephone Encounter (Signed)
We can call in molnupiravir 800 mg twice daily for 5 days.

## 2021-07-18 NOTE — Telephone Encounter (Signed)
Dr Patsey Berthold please advise:   Patient states she just tested positive for Covid on 07/17/2021 and would like to know if there is anything she can do?. Patient is currently using nebulizer, and Breztri inhaler,  albuterol inhaler.  Patient states she is SOB when up and walking, and has an extreme sore throat. She has been monitoring her oxygen sats which have been around 95%.   Patient states she is unable to be vaccinated do to her platelet count.

## 2021-07-18 NOTE — Telephone Encounter (Signed)
Per Dr Patsey Berthold pt should return to work after 5 days, and should also wear face mask upon going into work and while at work.   Message sent back to patient nothing further needed.

## 2021-07-18 NOTE — Telephone Encounter (Signed)
Patient is wanting to know when she can return to work. Not vaccinated against covid.

## 2021-07-19 ENCOUNTER — Other Ambulatory Visit: Payer: Self-pay | Admitting: *Deleted

## 2021-07-19 DIAGNOSIS — D693 Immune thrombocytopenic purpura: Secondary | ICD-10-CM

## 2021-07-20 ENCOUNTER — Encounter: Payer: Self-pay | Admitting: Oncology

## 2021-07-22 ENCOUNTER — Telehealth: Payer: BC Managed Care – PPO | Admitting: Nurse Practitioner

## 2021-07-22 DIAGNOSIS — U071 COVID-19: Secondary | ICD-10-CM

## 2021-07-22 NOTE — Progress Notes (Signed)
°  Based on what you shared with me, I feel your condition warrants further evaluation and I recommend that you be seen in a face to face visit.   NOTE: There will be NO CHARGE for this eVisit   If you are having a true medical emergency please call 911.      For an urgent face to face visit, [facility] has six urgent care centers for your convenience:     [facility] [place] Get Driving Directions [phone] [place], [place] [address]    [facility]) Get Driving Directions [phone] [place], [place] [address]  [facility]) Get Driving Directions [phone] [address] [place],  [place]  [address]  [facility] Get Driving Directions [phone] [address], [place], [place] [address]   [facility] Get Driving Directions  [phone] [address], [place] [address]   [facility] [place] Get Driving Directions [phone] [address], [place], [place] [address]  Your MyChart E-visit questionnaire answers were reviewed by a board certified advanced clinical practitioner to complete your personal care plan based on your specific symptoms.  Thank you for using e-Visits.

## 2021-07-24 ENCOUNTER — Telehealth: Payer: Self-pay | Admitting: Oncology

## 2021-07-24 ENCOUNTER — Other Ambulatory Visit: Payer: Self-pay

## 2021-07-24 MED ORDER — NICOTINE 10 MG IN INHA: 1.0000 | RESPIRATORY_TRACT | 1 refills | Status: DC | PRN

## 2021-07-24 NOTE — Telephone Encounter (Signed)
Nicotrol inhaler refilled  to preferred pharmacy, nothing further needed.

## 2021-07-24 NOTE — Telephone Encounter (Signed)
Pt called to reschedule her apppt for 1-10. She has Covid. Call back at [phone]

## 2021-07-24 NOTE — Telephone Encounter (Signed)
Called and LVM in regards to patients request for nicotrol, appears there is 1 refill left. Will try calling her again later.

## 2021-07-25 ENCOUNTER — Inpatient Hospital Stay: Payer: BC Managed Care – PPO

## 2021-07-26 NOTE — Progress Notes (Signed)
Agree with the details of the visit as noted by Marland Kitchen, NP.  Renold Don, MD Advanced Bronchoscopy PCCM [facility]

## 2021-07-27 ENCOUNTER — Encounter: Payer: Self-pay | Admitting: Oncology

## 2021-07-27 ENCOUNTER — Telehealth: Payer: BC Managed Care – PPO | Admitting: Physician Assistant

## 2021-07-27 DIAGNOSIS — J029 Acute pharyngitis, unspecified: Secondary | ICD-10-CM

## 2021-07-27 MED ORDER — LIDOCAINE VISCOUS HCL 2 % MT SOLN: OROMUCOSAL | 0 refills | Status: DC

## 2021-07-27 NOTE — Progress Notes (Signed)
E-Visit for Sore Throat  We are sorry that you are not feeling well.  Here is how we plan to help!  Your symptoms indicate a likely viral infection (Pharyngitis).   Pharyngitis is inflammation in the back of the throat which can cause a sore throat, scratchiness and sometimes difficulty swallowing.   Pharyngitis is typically caused by a respiratory virus and will just run its course.  Please keep in mind that your symptoms could last up to 10 days.  For throat pain, we recommend over the counter oral pain relief medications such as acetaminophen or aspirin, or anti-inflammatory medications such as ibuprofen or naproxen sodium.  Topical treatments such as oral throat lozenges or sprays may be used as needed.  I will prescribe Viscous Lidocaine for the sore throat.  Avoid close contact with loved ones, especially the very young and elderly.  Remember to wash your hands thoroughly throughout the day as this is the number one way to prevent the spread of infection and wipe down door knobs and counters with disinfectant.  These symptoms are quite consistent with the new strain of Covid Omnicron XBB1.5. It was noted on studies in [place] that sore throat and laryngitis (hoarse voice) being prominent symptoms from this strain.   For the laryngitis, continue warm liquids with honey and lemon and voice rest are the best treatments for that.   After careful review of your answers, I would not recommend an antibiotic for your condition.  Antibiotics should not be used to treat conditions that we suspect are caused by viruses like the virus that causes the common cold or flu. However, some people can have Strep with atypical symptoms. You may need formal testing in clinic or office to confirm if your symptoms continue or worsen.  Providers prescribe antibiotics to treat infections caused by bacteria. Antibiotics are very powerful in treating bacterial infections when they are used properly.  To maintain their  effectiveness, they should be used only when necessary.  Overuse of antibiotics has resulted in the development of super bugs that are resistant to treatment!    Home Care: Only take medications as instructed by your medical team. Do not drink alcohol while taking these medications. A steam or ultrasonic humidifier can help congestion.  You can place a towel over your head and breathe in the steam from hot water coming from a faucet. Avoid close contacts especially the very young and the elderly. Cover your mouth when you cough or sneeze. Always remember to wash your hands.  Get Help Right Away If: You develop worsening fever or throat pain. You develop a severe head ache or visual changes. Your symptoms persist after you have completed your treatment plan.  Make sure you Understand these instructions. Will watch your condition. Will get help right away if you are not doing well or get worse.   Thank you for choosing an e-visit.  Your e-visit answers were reviewed by a board certified advanced clinical practitioner to complete your personal care plan. Depending upon the condition, your plan could have included both over the counter or prescription medications.  Please review your pharmacy choice. Make sure the pharmacy is open so you can pick up prescription now. If there is a problem, you may contact your provider through [employer] and have the prescription routed to another pharmacy.  Your safety is important to [place]. If you have drug allergies check your prescription carefully.   For the next 24 hours you can use MyChart to  ask questions about today's visit, request a non-urgent call back, or ask for a work or school excuse. You will get an email in the next two days asking about your experience. I hope that your e-visit has been valuable and will speed your recovery.  I provided 5 minutes of non face-to-face time during this encounter for chart review and documentation.

## 2021-07-28 ENCOUNTER — Encounter: Payer: Self-pay | Admitting: Oncology

## 2021-07-31 NOTE — Telephone Encounter (Signed)
Called and spoke to patient about Dr Patsey Berthold Rec, patient had a clear understanding. Nothing further needed

## 2021-07-31 NOTE — Telephone Encounter (Signed)
Stay well-hydrated, can use ginger root tea to help loosen secretions.  Also can use extra strength Mucinex (plain, 1200 mg) 1 tablet twice a day.  Unfortunately nothing else that can be done she just needs to stay well-hydrated to help thin secretions.

## 2021-07-31 NOTE — Telephone Encounter (Signed)
Dr Patsey Berthold please advise :   Patient states she has lost her voice and that she is having trouble with coughing up her phlegm. Wants to know if there  is anything else she can do.

## 2021-08-09 ENCOUNTER — Telehealth: Payer: BC Managed Care – PPO | Admitting: Nurse Practitioner

## 2021-08-09 DIAGNOSIS — M545 Low back pain, unspecified: Secondary | ICD-10-CM | POA: Diagnosis not present

## 2021-08-09 MED ORDER — CYCLOBENZAPRINE HCL 10 MG PO TABS: 10.0000 mg | ORAL_TABLET | Freq: Three times a day (TID) | ORAL | 0 refills | Status: DC | PRN

## 2021-08-09 NOTE — Progress Notes (Signed)
We are sorry that you are not feeling well.  Here is how we plan to help!  Based on what you have shared with me it looks like you mostly have acute back pain.  Acute back pain is defined as musculoskeletal pain that can resolve in 1-3 weeks with conservative treatment.  I have prescribed Flexeril 10 mg every eight hours as needed which is a muscle relaxer  Some patients experience stomach irritation or in increased heartburn with anti-inflammatory drugs.  Please keep in mind that muscle relaxer's can cause fatigue and should not be taken while at work or driving.  Back pain is very common.  The pain often gets better over time.  The cause of back pain is usually not dangerous.  Most people can learn to manage their back pain on their own.  Home Care Stay active.  Start with short walks on flat ground if you can.  Try to walk farther each day. Do not sit, drive or stand in one place for more than 30 minutes.  Do not stay in bed. Do not avoid exercise or work.  Activity can help your back heal faster. Be careful when you bend or lift an object.  Bend at your knees, keep the object close to you, and do not twist. Sleep on a firm mattress.  Lie on your side, and bend your knees.  If you lie on your back, put a pillow under your knees. Only take medicines as told by your doctor. Put ice on the injured area. Put ice in a plastic bag Place a towel between your skin and the bag Leave the ice on for 15-20 minutes, 3-4 times a day for the first 2-3 days. 210 After that, you can switch between ice and heat packs. Ask your doctor about back exercises or massage. Avoid feeling anxious or stressed.  Find good ways to deal with stress, such as exercise.  Get Help Right Way If: Your pain does not go away with rest or medicine. Your pain does not go away in 1 week. You have new problems. You do not feel well. The pain spreads into your legs. You cannot control when you poop (bowel movement) or pee  (urinate) You feel sick to your stomach (nauseous) or throw up (vomit) You have belly (abdominal) pain. You feel like you may pass out (faint). If you develop a fever.  Make Sure you: Understand these instructions. Will watch your condition Will get help right away if you are not doing well or get worse.  Your e-visit answers were reviewed by a board certified advanced clinical practitioner to complete your personal care plan.  Depending on the condition, your plan could have included both over the counter or prescription medications.  If there is a problem please reply  once you have received a response from your provider.  Your safety is important to [place].  If you have drug allergies check your prescription carefully.    You can use MyChart to ask questions about todays visit, request a non-urgent call back, or ask for a work or school excuse for 24 hours related to this e-Visit. If it has been greater than 24 hours you will need to follow up with your provider, or enter a new e-Visit to address those concerns.  You will get an e-mail in the next two days asking about your experience.  I hope that your e-visit has been valuable and will speed your recovery. Thank you for using e-visits.  I spent approximately 7 minutes reviewing the patient's history, current symptoms and coordinating their plan of care today.    Meds ordered this encounter  Medications   cyclobenzaprine (FLEXERIL) 10 MG tablet    Sig: Take 1 tablet (10 mg total) by mouth 3 (three) times daily as needed for muscle spasms. Will cause drowsiness do not drive or operate machinery while using    Dispense:  15 tablet    Refill:  0

## 2021-08-11 DIAGNOSIS — M2569 Stiffness of other specified joint, not elsewhere classified: Secondary | ICD-10-CM | POA: Insufficient documentation

## 2021-08-15 ENCOUNTER — Inpatient Hospital Stay: Payer: BC Managed Care – PPO

## 2021-08-15 ENCOUNTER — Inpatient Hospital Stay: Payer: BC Managed Care – PPO | Attending: Oncology

## 2021-08-15 ENCOUNTER — Other Ambulatory Visit: Payer: Self-pay

## 2021-08-15 DIAGNOSIS — D693 Immune thrombocytopenic purpura: Secondary | ICD-10-CM

## 2021-08-15 LAB — CBC WITH DIFFERENTIAL/PLATELET
Abs Immature Granulocytes: 0.03 10*3/uL (ref 0.00–0.07)
Basophils Absolute: 0.1 10*3/uL (ref 0.0–0.1)
Basophils Relative: 1 %
Eosinophils Absolute: 0.3 10*3/uL (ref 0.0–0.5)
Eosinophils Relative: 4 %
HCT: 46.5 % — ABNORMAL HIGH (ref 36.0–46.0)
Hemoglobin: 15.9 g/dL — ABNORMAL HIGH (ref 12.0–15.0)
Immature Granulocytes: 0 %
Lymphocytes Relative: 31 %
Lymphs Abs: 2.5 10*3/uL (ref 0.7–4.0)
MCH: 33.4 pg (ref 26.0–34.0)
MCHC: 34.2 g/dL (ref 30.0–36.0)
MCV: 97.7 fL (ref 80.0–100.0)
Monocytes Absolute: 0.8 10*3/uL (ref 0.1–1.0)
Monocytes Relative: 9 %
Neutro Abs: 4.4 10*3/uL (ref 1.7–7.7)
Neutrophils Relative %: 55 %
Platelets: 52 10*3/uL — ABNORMAL LOW (ref 150–400)
RBC: 4.76 MIL/uL (ref 3.87–5.11)
RDW: 13.1 % (ref 11.5–15.5)
WBC: 8.1 10*3/uL (ref 4.0–10.5)
nRBC: 0 % (ref 0.0–0.2)

## 2021-08-15 MED ORDER — ROMIPLOSTIM INJECTION 500 MCG
350.0000 ug | Freq: Once | SUBCUTANEOUS | Status: AC
  Administered 2021-08-15: 350 ug via SUBCUTANEOUS
  Filled 2021-08-15: qty 0.5

## 2021-08-22 ENCOUNTER — Ambulatory Visit (INDEPENDENT_AMBULATORY_CARE_PROVIDER_SITE_OTHER): Payer: BC Managed Care – PPO | Admitting: Pulmonary Disease

## 2021-08-22 ENCOUNTER — Other Ambulatory Visit: Payer: Self-pay

## 2021-08-22 ENCOUNTER — Encounter: Payer: Self-pay | Admitting: Pulmonary Disease

## 2021-08-22 VITALS — BP 120/88 | HR 91 | Temp 97.1°F | Ht 62.0 in | Wt 196.6 lb

## 2021-08-22 DIAGNOSIS — D693 Immune thrombocytopenic purpura: Secondary | ICD-10-CM

## 2021-08-22 DIAGNOSIS — F17210 Nicotine dependence, cigarettes, uncomplicated: Secondary | ICD-10-CM

## 2021-08-22 DIAGNOSIS — J4489 Other specified chronic obstructive pulmonary disease: Secondary | ICD-10-CM

## 2021-08-22 DIAGNOSIS — J449 Chronic obstructive pulmonary disease, unspecified: Secondary | ICD-10-CM | POA: Diagnosis not present

## 2021-08-22 NOTE — Patient Instructions (Addendum)
Keep working on quitting smoking, you are doing a good job.  Continue using Breztri a day and albuterol as needed.  We will see you in follow-up in 2 to 3 months time call sooner should any new problems arise.

## 2021-08-22 NOTE — Progress Notes (Signed)
Subjective:    Patient ID: Carly [name], female    DOB: 1971/04/11, 51 y.o.   MRN: [id number] Chief Complaint  Patient presents with   Follow-up    HPI The patient is a 51 year old current smoker (1 PPD) who presents for follow-up on the issue of COPD.  She was initially seen here on 06 June 2021, at that time she noted that Trelegy did not help her as it initially had due to not lasting the whole day.  She was switched to [facility] 2 puffs twice a day.  She has been on Breztri she has noted improvement on her shortness of breath.  She has chronic morning cough productive of whitish to clear sputum.  No hemoptysis.  No fevers, chills or sweats.  She had PFTs performed on 8 November 22 with results as below.  She has mild to moderate COPD with asthmatic component. She has been working on quitting smoking and Nicotrol inhaler which she likes.  She feels this is helping her with her smoking cessation.    She does not endorse any orthopnea or paroxysmal nocturnal dyspnea.  No recent issues with gastroesophageal reflux.  No lower extremity edema.  Cough is overall better.  She has chronic ITP followed by hematology/oncology.  No recent flares with this.  She has multiple ecchymotic areas in her body due to this.  DATA 05/23/2021 PFTs: FEV1 1.60 L or 60% predicted, FVC 2.32 L or 69% predicted, FEV1/FVC 69%.  Lung volumes show hyperinflation and air trapping there is a small airways component with improvement postbronchodilator.  Diffusion capacity is mildly reduced.  Review of Systems A 10 point review of systems was performed and it is as noted above otherwise negative.  Patient Active Problem List   Diagnosis Date Noted   Acute bronchitis with COPD ([place]) 07/13/2021   Upper respiratory infection 07/13/2021   C. difficile diarrhea 07/04/2021   Gastroesophageal reflux disease without esophagitis 07/01/2020   Hyperlipidemia 07/01/2020   Posttraumatic vulvar hematoma 04/07/2020   Dizziness  03/29/2020   Fatty liver 12/24/2019   Angiolipoma of left kidney 12/24/2019   Chronic ITP (idiopathic thrombocytopenia) (HCC) 12/24/2019   Chronic ITP (idiopathic thrombocytopenic purpura) (HCC) 12/02/2019   Idiopathic thrombocytopenic purpura (ITP) ([place]) 11/10/2019   Verruca vulgaris 09/29/2019   Plantar fasciitis, bilateral 09/29/2019   Osteoarthritis of both knees 09/29/2019   Overactive bladder 07/14/2019   B12 deficiency 06/10/2019   Vitamin D deficiency 06/10/2019   Headache 01/30/2019   Elevated LFTs 01/30/2019   Abnormal uterine bleeding (AUB) 08/26/2018   Depression, major, single episode, moderate ([place]) 08/04/2018   Iron deficiency 09/02/2017   Menorrhagia with regular cycle 10/22/2016   Insomnia 08/15/2016   Nocturia 06/18/2016   Thrombocytopenia ([place]) 02/12/2016   COPD exacerbation ([place]) 01/31/2016   Carpal tunnel syndrome 01/20/2016   Lateral epicondylitis 01/20/2016   Obesity 01/04/2016   Palpitations 12/15/2015   Myalgia 10/31/2015   Fatigue 10/31/2015   Mixed incontinence 03/09/2015   COPD (chronic obstructive pulmonary disease) ([place]) 01/13/2015   Acute bronchitis 11/15/2014   Female hirsutism 09/22/2014   Elevated testosterone level in female 09/22/2014   Generalized anxiety disorder 08/23/2014   Tobacco use disorder 08/23/2014   Obesity (BMI 30-39.9) 08/23/2014   HTN (hypertension) 08/23/2014   Social History   Tobacco Use   Smoking status: Some Days    Packs/day: 1.50    Years: 28.00    Pack years: 42.00    Types: Cigarettes   Smokeless tobacco: Never  Tobacco comments:    Patient reports 3-4 PPD.  Substance Use Topics   Alcohol use: Yes    Alcohol/week: 8.0 standard drinks    Types: 8 Standard drinks or equivalent per week    Comment: Beer (Can)    Allergies  Allergen Reactions   Meloxicam Nausea Only   Current Meds  Medication Sig   acetaminophen (TYLENOL) 500 MG tablet Take 500 mg by mouth every 6 (six) hours as needed.   albuterol  (PROVENTIL) (2.5 MG/3ML) 0.083% nebulizer solution Take 3 mLs (2.5 mg total) by nebulization every 6 (six) hours as needed for wheezing or shortness of breath.   albuterol (VENTOLIN HFA) 108 (90 Base) MCG/ACT inhaler INHALE 2 PUFFS INTO THE LUNGS EVERY 6 HOURS AS NEEDED FOR WHEEZING OR SHORTNESS OF BREATH (COUGH)   ALPRAZolam (XANAX) 0.25 MG tablet Take 1 tablet (0.25 mg total) by mouth daily as needed for anxiety.   amLODipine (NORVASC) 10 MG tablet Take 1 tablet (10 mg total) by mouth daily.   benzonatate (TESSALON PERLES) 100 MG capsule Take 1 capsule (100 mg total) by mouth 3 (three) times daily as needed for cough.   Budeson-Glycopyrrol-Formoterol (BREZTRI AEROSPHERE) 160-9-4.8 MCG/ACT AERO Inhale 2 puffs into the lungs in the morning and at bedtime.   carvedilol (COREG) 25 MG tablet Take 1 tablet (25 mg total) by mouth 2 (two) times daily with a meal.   cetirizine (ZYRTEC) 10 MG tablet Take 1 tablet (10 mg total) by mouth daily.   Cholecalciferol 1.25 MG (50000 UT) capsule Take 1 capsule (50,000 Units total) by mouth every 30 (thirty) days. 1x per month note this   cyanocobalamin (,VITAMIN B-12,) 1000 MCG/ML injection INJECT 1 ML (1,000 MCG TOTAL) INTO THE MUSCLE EVERY 30 DAYS.   cyclobenzaprine (FLEXERIL) 10 MG tablet Take 1 tablet (10 mg total) by mouth 3 (three) times daily as needed for muscle spasms. Will cause drowsiness do not drive or operate machinery while using   diclofenac Sodium (VOLTAREN) 1 % GEL Apply 2-4 g topically 4 (four) times daily. 2 grams upper body qid prn and 4 gram lower body qid prn   dicyclomine (BENTYL) 10 MG capsule TAKE 1 CAPSULE (10 MG TOTAL) BY MOUTH 3 (THREE) TIMES DAILY AS NEEDED FOR SPASMS. BEFORE FOOD   escitalopram (LEXAPRO) 20 MG tablet TAKE 1 TABLET BY MOUTH EVERY DAY   fluticasone (FLONASE) 50 MCG/ACT nasal spray Place 1 spray into both nostrils daily.   guaiFENesin (MUCINEX) 600 MG 12 hr tablet Take 1 tablet (600 mg total) by mouth 2 (two) times daily.    lidocaine (XYLOCAINE) 2 % solution 78mL swish and swallow every 4 hours as needed for sore throat   nicotine (NICOTROL) 10 MG inhaler Inhale 1 Cartridge (1 continuous puffing total) into the lungs as needed for smoking cessation (No more than 16 cartridges per day).   omeprazole (PRILOSEC) 20 MG capsule TAKE 1 CAPSULE BY MOUTH EVERY DAY   ondansetron (ZOFRAN) 4 MG tablet TAKE 1 TABLET BY MOUTH EVERY 8 HOURS AS NEEDED FOR NAUSEA AND VOMITING   predniSONE (DELTASONE) 10 MG tablet 4 tabs for 2 days, then 3 tabs for 2 days, 2 tabs for 2 days, then 1 tab for 2 days, then stop   promethazine-dextromethorphan (PROMETHAZINE-DM) 6.25-15 MG/5ML syrup Take 5 mLs by mouth 4 (four) times daily as needed for cough.   spironolactone (ALDACTONE) 25 MG tablet Take 1 tablet (25 mg total) by mouth daily. In am   Syringe/Needle, Disp, 25G X 1-1/2" 1  ML MISC 1 Device by Does not apply route as directed.   traMADol (ULTRAM) 50 MG tablet Take 1 tablet (50 mg total) by mouth every 12 (twelve) hours as needed.   traZODone (DESYREL) 50 MG tablet TAKE 0.5-1 TABLETS BY MOUTH AT BEDTIME AS NEEDED FOR SLEEP.   vancomycin (VANCOCIN) 125 MG capsule Take 1 capsule (125 mg total) by mouth 4 (four) times daily. With food   Immunization History  Administered Date(s) Administered   Influenza,inj,Quad PF,6+ Mos 08/23/2014   Influenza-Unspecified 04/16/2019, 04/26/2021   Tdap 08/23/2014       Objective:   Physical Exam BP 120/88 (BP Location: Left Arm, Patient Position: Sitting, Cuff Size: Normal)   Pulse 91   Temp (!) 97.1 F (36.2 C) (Oral)   Ht 5\' 2"  (1.575 m)   Wt 196 lb 9.6 oz (89.2 kg)   SpO2 97%   BMI 35.96 kg/m  GENERAL: Awake, well-developed, no acute distress, fully ambulatory.  Overweight. HEAD: Normocephalic, atraumatic.  EYES: Pupils equal, round, reactive to light.  No scleral icterus.  MOUTH: Nose/mouth/throat not examined due to masking requirements for COVID 19. NECK: Supple. No thyromegaly. Trachea  midline. No JVD.  No adenopathy. PULMONARY: Good air entry bilaterally.  No adventitious sounds. CARDIOVASCULAR: S1 and S2. Regular rate and rhythm.  No rubs, murmurs or gallops heard. ABDOMEN: Benign.   MUSCULOSKELETAL: No joint deformity, no clubbing, no edema.  NEUROLOGIC: No focal deficit, speech is fluent, no gait disturbance. SKIN: Intact,warm,dry.  Ecchymoses and petechiae upper extremities. PSYCH: Normal mood and behavior.       Assessment & Plan:     ICD-10-CM   1. Asthma-COPD overlap syndrome ([place])  J44.9    Appears well compensated on Breztri 2 puffs twice a day Continue same    2. Chronic ITP (idiopathic thrombocytopenic purpura) (HCC)  D69.3    Follows with hematology/oncology This issue adds complexity to her my management    3. Tobacco dependence due to cigarettes  F17.210    Continue Nicotrol inhaler Counseled, total counseling time 3 to 5 minutes     Overall, Carly [name] appears to be doing well.  We will see her in follow-up in 2 to 3 months time she is to contact us prior to that time should any new difficulties arise.  She was commended today on her efforts of quitting smoking.  Renold Don, MD Advanced Bronchoscopy PCCM [facility]    *This note was dictated using voice recognition software/Dragon.  Despite best efforts to proofread, errors can occur which can change the meaning. Any transcriptional errors that result from this process are unintentional and may not be fully corrected at the time of dictation.

## 2021-09-04 NOTE — Progress Notes (Signed)
Carly [name]  [phone] [phone]  ID: Lin Landsman OB: 1971-02-05  MR#: [id number]  OTR#:[id number]  Patient Care Team: McLean-Scocuzza, Nino Glow, MD as PCP - General (Internal Medicine) Lloyd Huger, MD as Consulting Physician (Oncology)   CHIEF COMPLAINT: ITP.  INTERVAL HISTORY: Patient returns to clinic today for repeat laboratory work, further evaluation, and continuation of Nplate.  She continues to feel well and remains asymptomatic.  She denies any easy bleeding or bruising. She has no neurologic complaints.  She denies any recent fevers or illnesses.  She has a good appetite and denies weight loss.  She denies any chest pain, shortness of breath, cough, or hemoptysis. She denies any nausea, vomiting, constipation, or diarrhea.  She has no urinary complaints.  Patient offers no specific complaints today.  REVIEW OF SYSTEMS:   Review of Systems  Constitutional: Negative.  Negative for fever, malaise/fatigue and weight loss.  Respiratory: Negative.  Negative for cough, hemoptysis and shortness of breath.   Cardiovascular: Negative.  Negative for chest pain and leg swelling.  Gastrointestinal: Negative.  Negative for abdominal pain, blood in stool and melena.  Genitourinary: Negative.  Negative for hematuria.  Musculoskeletal: Negative.  Negative for back pain.  Skin: Negative.  Negative for rash.  Neurological: Negative.  Negative for dizziness, focal weakness, weakness and headaches.  Endo/Heme/Allergies: Negative.  Does not bruise/bleed easily.  Psychiatric/Behavioral: Negative.  The patient is not nervous/anxious.    As per HPI. Otherwise, a complete review of systems is negative.  PAST MEDICAL HISTORY: Past Medical History:  Diagnosis Date   Anxiety    C. difficile diarrhea    07/04/21   Chicken pox    Depression    Elevated testosterone level in female    Emphysema of lung (HCC)    bronchitis   Frequent headaches     Hay fever    Hypertension    Idiopathic thrombocytopenic purpura (ITP) (HCC)    Positive ANA (antinuclear antibody)     PAST SURGICAL HISTORY: Past Surgical History:  Procedure Laterality Date   HEMATOMA EVACUATION N/A 04/07/2020   Procedure: EVACUATION HEMATOMA  AND APPLICATION OF PRESSURE DRESSING;  Surgeon: Benjaman Kindler, MD;  Location: [facility] ORS;  Service: Gynecology;  Laterality: N/A;   HYSTEROSCOPY WITH NOVASURE N/A 02/21/2018   Procedure: HYSTEROSCOPY WITH NOVASURE, POLYPECTOMY;  Surgeon: Benjaman Kindler, MD;  Location: [facility] ORS;  Service: Gynecology;  Laterality: N/A;   TUBAL LIGATION  1997   TUBAL LIGATION      FAMILY HISTORY: Family History  Problem Relation Age of Onset   Hyperlipidemia Mother    Heart disease Mother    Stroke Mother    Depression Mother    Mental illness Mother    Heart attack Mother 46   Hyperlipidemia Father    Depression Father    Mental illness Father    Diabetes Paternal Grandfather    Cancer Cousin     ADVANCED DIRECTIVES (Y/N):  N  HEALTH MAINTENANCE: Social History   Tobacco Use   Smoking status: Some Days    Packs/day: 1.50    Years: 28.00    Pack years: 42.00    Types: Cigarettes   Smokeless tobacco: Never   Tobacco comments:    Patient reports 3-4 PPD.  Vaping Use   Vaping Use: Never used  Substance Use Topics   Alcohol use: Yes    Alcohol/week: 8.0 standard drinks    Types: 8 Standard drinks or equivalent per week    Comment:  Beer (Can)    Drug use: No    Comment: CBD oil     Colonoscopy:  PAP:  Bone density:  Lipid panel:  Allergies  Allergen Reactions   Meloxicam Nausea Only    Current Outpatient Medications  Medication Sig Dispense Refill   acetaminophen (TYLENOL) 500 MG tablet Take 500 mg by mouth every 6 (six) hours as needed.     albuterol (PROVENTIL) (2.5 MG/3ML) 0.083% nebulizer solution Take 3 mLs (2.5 mg total) by nebulization every 6 (six) hours as needed for wheezing or shortness of breath. 150  mL 1   albuterol (VENTOLIN HFA) 108 (90 Base) MCG/ACT inhaler INHALE 2 PUFFS INTO THE LUNGS EVERY 6 HOURS AS NEEDED FOR WHEEZING OR SHORTNESS OF BREATH (COUGH) 6.7 each 3   ALPRAZolam (XANAX) 0.25 MG tablet Take 1 tablet (0.25 mg total) by mouth daily as needed for anxiety. 30 tablet 2   amLODipine (NORVASC) 10 MG tablet Take 1 tablet (10 mg total) by mouth daily. 90 tablet 3   benzonatate (TESSALON PERLES) 100 MG capsule Take 1 capsule (100 mg total) by mouth 3 (three) times daily as needed for cough. 30 capsule 0   Budeson-Glycopyrrol-Formoterol (BREZTRI AEROSPHERE) 160-9-4.8 MCG/ACT AERO Inhale 2 puffs into the lungs in the morning and at bedtime. 10.7 g 5   carvedilol (COREG) 25 MG tablet Take 1 tablet (25 mg total) by mouth 2 (two) times daily with a meal. 180 tablet 3   cetirizine (ZYRTEC) 10 MG tablet Take 1 tablet (10 mg total) by mouth daily. 90 tablet 3   Cholecalciferol 1.25 MG (50000 UT) capsule Take 1 capsule (50,000 Units total) by mouth every 30 (thirty) days. 1x per month note this 13 capsule 0   cyanocobalamin (,VITAMIN B-12,) 1000 MCG/ML injection INJECT 1 ML (1,000 MCG TOTAL) INTO THE MUSCLE EVERY 30 DAYS. 1 mL 15   cyclobenzaprine (FLEXERIL) 10 MG tablet Take 1 tablet (10 mg total) by mouth 3 (three) times daily as needed for muscle spasms. Will cause drowsiness do not drive or operate machinery while using 15 tablet 0   diclofenac Sodium (VOLTAREN) 1 % GEL Apply 2-4 g topically 4 (four) times daily. 2 grams upper body qid prn and 4 gram lower body qid prn 150 g 11   dicyclomine (BENTYL) 10 MG capsule TAKE 1 CAPSULE (10 MG TOTAL) BY MOUTH 3 (THREE) TIMES DAILY AS NEEDED FOR SPASMS. BEFORE FOOD 90 capsule 11   escitalopram (LEXAPRO) 20 MG tablet TAKE 1 TABLET BY MOUTH EVERY DAY 90 tablet 3   fluticasone (FLONASE) 50 MCG/ACT nasal spray Place 1 spray into both nostrils daily. 18.2 mL 2   guaiFENesin (MUCINEX) 600 MG 12 hr tablet Take 1 tablet (600 mg total) by mouth 2 (two) times  daily. 60 tablet 0   lidocaine (XYLOCAINE) 2 % solution 43m swish and swallow every 4 hours as needed for sore throat 100 mL 0   nicotine (NICOTROL) 10 MG inhaler Inhale 1 Cartridge (1 continuous puffing total) into the lungs as needed for smoking cessation (No more than 16 cartridges per day). 36 each 1   omeprazole (PRILOSEC) 20 MG capsule TAKE 1 CAPSULE BY MOUTH EVERY DAY 90 capsule 3   ondansetron (ZOFRAN) 4 MG tablet TAKE 1 TABLET BY MOUTH EVERY 8 HOURS AS NEEDED FOR NAUSEA AND VOMITING 10 tablet 11   spironolactone (ALDACTONE) 25 MG tablet Take 1 tablet (25 mg total) by mouth daily. In am 90 tablet 3   Syringe/Needle, Disp, 25G X 1-1/2"  1 ML MISC 1 Device by Does not apply route as directed. 30 each 11   traMADol (ULTRAM) 50 MG tablet Take 1 tablet (50 mg total) by mouth every 12 (twelve) hours as needed. 60 tablet 0   traZODone (DESYREL) 50 MG tablet TAKE 0.5-1 TABLETS BY MOUTH AT BEDTIME AS NEEDED FOR SLEEP. 90 tablet 3   No current facility-administered medications for this visit.    OBJECTIVE: Vitals:   09/05/21 1412  BP: (!) 149/85  Pulse: 85  Resp: 20  Temp: (!) 97.3 F (36.3 C)  SpO2: 98%     Body mass index is 36.69 kg/m.    ECOG FS:0 - Asymptomatic  General: Well-developed, well-nourished, no acute distress. Eyes: Pink conjunctiva, anicteric sclera. HEENT: Normocephalic, moist mucous membranes. Lungs: No audible wheezing or coughing. Heart: Regular rate and rhythm. Abdomen: Soft, nontender, no obvious distention. Musculoskeletal: No edema, cyanosis, or clubbing. Neuro: Alert, answering all questions appropriately. Cranial nerves grossly intact. Skin: No rashes or petechiae noted. Psych: Normal affect.  LAB RESULTS:  Lab Results  Component Value Date   NA 139 05/04/2021   K 3.8 05/04/2021   CL 101 05/04/2021   CO2 22 05/04/2021   GLUCOSE 75 05/04/2021   BUN 8 05/04/2021   CREATININE 0.61 05/04/2021   CALCIUM 9.1 05/04/2021   PROT 7.0 05/04/2021   ALBUMIN  4.5 05/04/2021   AST 41 (H) 05/04/2021   ALT 30 05/04/2021   ALKPHOS 96 05/04/2021   BILITOT 0.6 05/04/2021   GFRNONAA >60 04/08/2021   GFRAA >60 04/08/2020    Lab Results  Component Value Date   WBC 9.3 09/05/2021   NEUTROABS 5.6 09/05/2021   HGB 15.6 (H) 09/05/2021   HCT 46.5 (H) 09/05/2021   MCV 98.9 09/05/2021   PLT 41 (L) 09/05/2021     STUDIES: No results found.  ASSESSMENT: ITP  PLAN:    1.  ITP: Confirmed with normal bone marrow biopsy and normal FISH and cytogenetics on July 22, 2019.  Patient had a positive ANA which is likely clinically insignificant.  The remainder of her laboratory work was either negative or within normal limits. CT scan on March 04, 2017 did not reveal splenomegaly.  Previously, patient received 1 week of prednisone at 1 mg/kg dose and had no appreciable change in her platelet count.  Rituxan was denied by insurance.  Previously, patient received 3 mcg/kg of Nplate with her platelet count increasing from 33-287.  Plan to increase her dose to 4 mcg/kg to obtain more durable results and less frequent injections.  Patient will return to clinic in 4 and 8 weeks for laboratory work and Nplate only if her platelet count remains below 100.  Patient then return to clinic in 12 weeks with repeat laboratory, further evaluation, and continuation of treatment.   2.  History of positive ANA: Likely clinically insignificant.  Referral was previously sent to rheumatology.  I spent a total of 30 minutes reviewing chart data, face-to-face evaluation with the patient, counseling and coordination of care as detailed above.   Patient expressed understanding and was in agreement with this plan. She also understands that She can call clinic at any time with any questions, concerns, or complaints.    Lloyd Huger, MD   09/06/2021 9:41 AM

## 2021-09-05 ENCOUNTER — Inpatient Hospital Stay (HOSPITAL_BASED_OUTPATIENT_CLINIC_OR_DEPARTMENT_OTHER): Payer: BC Managed Care – PPO | Admitting: Oncology

## 2021-09-05 ENCOUNTER — Encounter: Payer: Self-pay | Admitting: Oncology

## 2021-09-05 ENCOUNTER — Inpatient Hospital Stay: Payer: BC Managed Care – PPO | Attending: Oncology

## 2021-09-05 ENCOUNTER — Other Ambulatory Visit: Payer: Self-pay

## 2021-09-05 ENCOUNTER — Inpatient Hospital Stay: Payer: BC Managed Care – PPO

## 2021-09-05 VITALS — BP 149/85 | HR 85 | Temp 97.3°F | Resp 20 | Wt 200.6 lb

## 2021-09-05 DIAGNOSIS — D693 Immune thrombocytopenic purpura: Secondary | ICD-10-CM | POA: Insufficient documentation

## 2021-09-05 LAB — CBC WITH DIFFERENTIAL/PLATELET
Abs Immature Granulocytes: 0.02 10*3/uL (ref 0.00–0.07)
Basophils Absolute: 0.1 10*3/uL (ref 0.0–0.1)
Basophils Relative: 1 %
Eosinophils Absolute: 0.3 10*3/uL (ref 0.0–0.5)
Eosinophils Relative: 3 %
HCT: 46.5 % — ABNORMAL HIGH (ref 36.0–46.0)
Hemoglobin: 15.6 g/dL — ABNORMAL HIGH (ref 12.0–15.0)
Immature Granulocytes: 0 %
Lymphocytes Relative: 27 %
Lymphs Abs: 2.5 10*3/uL (ref 0.7–4.0)
MCH: 33.2 pg (ref 26.0–34.0)
MCHC: 33.5 g/dL (ref 30.0–36.0)
MCV: 98.9 fL (ref 80.0–100.0)
Monocytes Absolute: 0.9 10*3/uL (ref 0.1–1.0)
Monocytes Relative: 10 %
Neutro Abs: 5.6 10*3/uL (ref 1.7–7.7)
Neutrophils Relative %: 59 %
Platelets: 41 10*3/uL — ABNORMAL LOW (ref 150–400)
RBC: 4.7 MIL/uL (ref 3.87–5.11)
RDW: 13 % (ref 11.5–15.5)
WBC: 9.3 10*3/uL (ref 4.0–10.5)
nRBC: 0 % (ref 0.0–0.2)

## 2021-09-05 MED ORDER — ROMIPLOSTIM INJECTION 500 MCG
350.0000 ug | Freq: Once | SUBCUTANEOUS | Status: AC
  Administered 2021-09-05: 350 ug via SUBCUTANEOUS
  Filled 2021-09-05: qty 0.7

## 2021-09-05 NOTE — Progress Notes (Signed)
Here for treatment of ITP. Denies any bleeding. Has a big bruise on abdomen from barely bumping into something. Bowels regular. Denies fatigue. Appetite is good.

## 2021-09-06 ENCOUNTER — Encounter: Payer: Self-pay | Admitting: Oncology

## 2021-09-14 ENCOUNTER — Ambulatory Visit (INDEPENDENT_AMBULATORY_CARE_PROVIDER_SITE_OTHER): Payer: BC Managed Care – PPO

## 2021-09-14 ENCOUNTER — Encounter: Payer: Self-pay | Admitting: Podiatry

## 2021-09-14 ENCOUNTER — Other Ambulatory Visit: Payer: Self-pay

## 2021-09-14 ENCOUNTER — Ambulatory Visit: Payer: BC Managed Care – PPO | Admitting: Podiatry

## 2021-09-14 DIAGNOSIS — M722 Plantar fascial fibromatosis: Secondary | ICD-10-CM | POA: Diagnosis not present

## 2021-09-14 NOTE — Progress Notes (Signed)
?Subjective:  ?Patient ID: Carly [name], female    DOB: 1970/12/09,  MRN: [id number] ? ?Chief Complaint  ?Patient presents with  ? Foot Pain  ?  Bilateral foot pain   ? ? ?51 y.o. female presents with the above complaint.  Patient presents with complaint bilateral heel pain right greater than left side.  Patient states painful to touch has progressed to gotten worse.  Hurts with ambulation.  She works about 10 to 12 hours on her feet all day.  She has not seen anyone else prior to seeing me.  Hurts with every step.  Pain scale is 8 out of 10.  Dull aching nature. ? ? ?Review of Systems: Negative except as noted in the HPI. Denies N/V/F/Ch. ? ?Past Medical History:  ?Diagnosis Date  ? Anxiety   ? C. difficile diarrhea   ? 07/04/21  ? Chicken pox   ? Depression   ? Elevated testosterone level in female   ? Emphysema of lung ([facility])   ? bronchitis  ? Frequent headaches   ? Hay fever   ? Hypertension   ? Idiopathic thrombocytopenic purpura (ITP) (HCC)   ? Positive ANA (antinuclear antibody)   ? ? ?Current Outpatient Medications:  ?  acetaminophen (TYLENOL) 500 MG tablet, Take 500 mg by mouth every 6 (six) hours as needed., Disp: , Rfl:  ?  albuterol (PROVENTIL) (2.5 MG/3ML) 0.083% nebulizer solution, Take 3 mLs (2.5 mg total) by nebulization every 6 (six) hours as needed for wheezing or shortness of breath., Disp: 150 mL, Rfl: 1 ?  albuterol (VENTOLIN HFA) 108 (90 Base) MCG/ACT inhaler, INHALE 2 PUFFS INTO THE LUNGS EVERY 6 HOURS AS NEEDED FOR WHEEZING OR SHORTNESS OF BREATH (COUGH), Disp: 6.7 each, Rfl: 3 ?  ALPRAZolam (XANAX) 0.25 MG tablet, Take 1 tablet (0.25 mg total) by mouth daily as needed for anxiety., Disp: 30 tablet, Rfl: 2 ?  amLODipine (NORVASC) 10 MG tablet, Take 1 tablet (10 mg total) by mouth daily., Disp: 90 tablet, Rfl: 3 ?  benzonatate (TESSALON PERLES) 100 MG capsule, Take 1 capsule (100 mg total) by mouth 3 (three) times daily as needed for cough., Disp: 30 capsule, Rfl: 0 ?   Budeson-Glycopyrrol-Formoterol (BREZTRI AEROSPHERE) 160-9-4.8 MCG/ACT AERO, Inhale 2 puffs into the lungs in the morning and at bedtime., Disp: 10.7 g, Rfl: 5 ?  carvedilol (COREG) 25 MG tablet, Take 1 tablet (25 mg total) by mouth 2 (two) times daily with a meal., Disp: 180 tablet, Rfl: 3 ?  cetirizine (ZYRTEC) 10 MG tablet, Take 1 tablet (10 mg total) by mouth daily., Disp: 90 tablet, Rfl: 3 ?  Cholecalciferol 1.25 MG (50000 UT) capsule, Take 1 capsule (50,000 Units total) by mouth every 30 (thirty) days. 1x per month note this, Disp: 13 capsule, Rfl: 0 ?  cyanocobalamin (,VITAMIN B-12,) 1000 MCG/ML injection, INJECT 1 ML (1,000 MCG TOTAL) INTO THE MUSCLE EVERY 30 DAYS., Disp: 1 mL, Rfl: 15 ?  cyclobenzaprine (FLEXERIL) 10 MG tablet, Take 1 tablet (10 mg total) by mouth 3 (three) times daily as needed for muscle spasms. Will cause drowsiness do not drive or operate machinery while using, Disp: 15 tablet, Rfl: 0 ?  diclofenac Sodium (VOLTAREN) 1 % GEL, Apply 2-4 g topically 4 (four) times daily. 2 grams upper body qid prn and 4 gram lower body qid prn, Disp: 150 g, Rfl: 11 ?  dicyclomine (BENTYL) 10 MG capsule, TAKE 1 CAPSULE (10 MG TOTAL) BY MOUTH 3 (THREE) TIMES DAILY AS NEEDED FOR  SPASMS. BEFORE FOOD, Disp: 90 capsule, Rfl: 11 ?  escitalopram (LEXAPRO) 20 MG tablet, TAKE 1 TABLET BY MOUTH EVERY DAY, Disp: 90 tablet, Rfl: 3 ?  fluticasone (FLONASE) 50 MCG/ACT nasal spray, Place 1 spray into both nostrils daily., Disp: 18.2 mL, Rfl: 2 ?  guaiFENesin (MUCINEX) 600 MG 12 hr tablet, Take 1 tablet (600 mg total) by mouth 2 (two) times daily., Disp: 60 tablet, Rfl: 0 ?  lidocaine (XYLOCAINE) 2 % solution, 37mL swish and swallow every 4 hours as needed for sore throat, Disp: 100 mL, Rfl: 0 ?  nicotine (NICOTROL) 10 MG inhaler, Inhale 1 Cartridge (1 continuous puffing total) into the lungs as needed for smoking cessation (No more than 16 cartridges per day)., Disp: 36 each, Rfl: 1 ?  omeprazole (PRILOSEC) 20 MG capsule,  TAKE 1 CAPSULE BY MOUTH EVERY DAY, Disp: 90 capsule, Rfl: 3 ?  ondansetron (ZOFRAN) 4 MG tablet, TAKE 1 TABLET BY MOUTH EVERY 8 HOURS AS NEEDED FOR NAUSEA AND VOMITING, Disp: 10 tablet, Rfl: 11 ?  spironolactone (ALDACTONE) 25 MG tablet, Take 1 tablet (25 mg total) by mouth daily. In am, Disp: 90 tablet, Rfl: 3 ?  Syringe/Needle, Disp, 25G X 1-1/2" 1 ML MISC, 1 Device by Does not apply route as directed., Disp: 30 each, Rfl: 11 ?  traMADol (ULTRAM) 50 MG tablet, Take 1 tablet (50 mg total) by mouth every 12 (twelve) hours as needed., Disp: 60 tablet, Rfl: 0 ?  traZODone (DESYREL) 50 MG tablet, TAKE 0.5-1 TABLETS BY MOUTH AT BEDTIME AS NEEDED FOR SLEEP., Disp: 90 tablet, Rfl: 3 ? ?Social History  ? ?Tobacco Use  ?Smoking Status Some Days  ? Packs/day: 1.50  ? Years: 28.00  ? Pack years: 42.00  ? Types: Cigarettes  ?Smokeless Tobacco Never  ?Tobacco Comments  ? Patient reports 3-4 PPD.  ? ? ?Allergies  ?Allergen Reactions  ? Meloxicam Nausea Only  ? ?Objective:  ?There were no vitals filed for this visit. ?There is no height or weight on file to calculate BMI. ?Constitutional Well developed. ?Well nourished.  ?Vascular Dorsalis pedis pulses palpable bilaterally. ?Posterior tibial pulses palpable bilaterally. ?Capillary refill normal to all digits.  ?No cyanosis or clubbing noted. ?Pedal hair growth normal.  ?Neurologic Normal speech. ?Oriented to person, place, and time. ?Epicritic sensation to light touch grossly present bilaterally.  ?Dermatologic Nails well groomed and normal in appearance. ?No open wounds. ?No skin lesions.  ?Orthopedic: Normal joint ROM without pain or crepitus bilaterally. ?No visible deformities. ?Tender to palpation at the calcaneal tuber bilaterally. ?No pain with calcaneal squeeze bilaterally. ?Ankle ROM diminished range of motion bilaterally. ?Silfverskiold Test: positive bilaterally.  ? ?Radiographs: Taken and reviewed. No acute fractures or dislocations. No evidence of stress fracture.   Plantar heel spur present. Posterior heel spur absent.  ? ?Assessment:  ? ?1. Plantar fasciitis of right foot   ?2. Plantar fasciitis of left foot   ? ?Plan:  ?Patient was evaluated and treated and all questions answered. ? ?Plantar Fasciitis, bilaterally ?- XR reviewed as above.  ?- Educated on icing and stretching. Instructions given.  ?- Injection delivered to the plantar fascia as below. ?- DME: Plantar fascial brace dispensed to support the medial longitudinal arch of the foot and offload pressure from the heel and prevent arch collapse during weightbearing ?- Pharmacologic management: None ? ?Procedure: Injection Tendon/Ligament ?Location: Bilateral plantar fascia at the glabrous junction; medial approach. ?Skin Prep: alcohol ?Injectate: 0.5 cc 0.5% marcaine plain, 0.5 cc of 1% Lidocaine,  0.5 cc kenalog 10. ?Disposition: Patient tolerated procedure well. Injection site dressed with a band-aid. ? ?No follow-ups on file. ?

## 2021-09-15 ENCOUNTER — Telehealth: Payer: BC Managed Care – PPO | Admitting: Physician Assistant

## 2021-09-15 DIAGNOSIS — R112 Nausea with vomiting, unspecified: Secondary | ICD-10-CM

## 2021-09-15 DIAGNOSIS — R1084 Generalized abdominal pain: Secondary | ICD-10-CM

## 2021-09-15 DIAGNOSIS — K5900 Constipation, unspecified: Secondary | ICD-10-CM

## 2021-09-15 NOTE — Progress Notes (Signed)
Based on what you shared with me, I feel your condition warrants further evaluation as soon as possible at an Emergency department.  ? ?Your symptoms are worrisome for possible bowel obstruction and should be evaluated as soon as possible in person.  ?  ?NOTE: There will be NO CHARGE for this eVisit ?  ?If you are having a true medical emergency please call 911.   ?  ? ?Emergency [facility]  ?Get Driving Directions  ?[phone]  ?[address]  ?[place], [place] [address]  ?Open 24/7/365  ?  ?  ?[place] [facility] at [facility]  ?Get Driving Directions  ?[place]  ?[place], [place] [address]  ?Open 24/7/365  ?  ?Emergency [facility]  ?Get Driving Directions  ?[phone]  ?[address]  ?[place], [place] [address]  ?Open 24/7/365  ?  ?  ?[facility]  ?Get Driving Directions  ?[phone]  ?[address]  ?[place], [place] [address]  ?Open 24/7/365  ?  ?[place]  ?Emergency [place]  ?Get Driving Directions  ?[phone]  ?[address]  ?[place], [place] [address]  ?Open 24/7/365  ?  ?[place]  ?Emergency [place]  ?Get Driving Directions  ?[place]  ?[place], [place] [address]  ?Open 24/7/365  ?  ?[place]  ?Emergency [facility]  ?Get Driving Directions  ?[phone]  ?[name]  ?[place],  [address]  ?Open 24/7/365  ? ?I provided 5 minutes of non face-to-face time during this encounter for chart review and documentation.  ? ?

## 2021-09-19 ENCOUNTER — Telehealth: Payer: BC Managed Care – PPO | Admitting: Physician Assistant

## 2021-09-19 DIAGNOSIS — K5903 Drug induced constipation: Secondary | ICD-10-CM

## 2021-09-19 MED ORDER — LINACLOTIDE 145 MCG PO CAPS: 145.0000 ug | ORAL_CAPSULE | Freq: Every day | ORAL | 0 refills | Status: DC

## 2021-09-19 NOTE — Progress Notes (Signed)
Virtual Visit Consent   YAMARIS CUMMINGS, you are scheduled for a virtual visit with a [facility] provider today.     Just as with appointments in the office, your consent must be obtained to participate.  Your consent will be active for this visit and any virtual visit you may have with one of our providers in the next 365 days.     If you have a MyChart account, a copy of this consent can be sent to you electronically.  All virtual visits are billed to your insurance company just like a traditional visit in the office.    As this is a virtual visit, video technology does not allow for your provider to perform a traditional examination.  This may limit your provider's ability to fully assess your condition.  If your provider identifies any concerns that need to be evaluated in person or the need to arrange testing (such as labs, EKG, etc.), we will make arrangements to do so.     Although advances in technology are sophisticated, we cannot ensure that it will always work on either your end or our end.  If the connection with a video visit is poor, the visit may have to be switched to a telephone visit.  With either a video or telephone visit, we are not always able to ensure that we have a secure connection.     I need to obtain your verbal consent now.   Are you willing to proceed with your visit today?    Adaysha ROSHAWNA COLCLASURE has provided verbal consent on 09/19/2021 for a virtual visit (video or telephone).   Mar Daring, PA-C   Date: 09/19/2021 5:57 PM   Virtual Visit via Video Note   [name], connected with  EDWARD TREVINO  ([id number], [birth year]) on 09/19/21 at  5:45 PM EST by a video-enabled telemedicine application and verified that I am speaking with the correct person using two identifiers.  Location: Patient: Virtual Visit Location Patient: Home Provider: Virtual Visit Location Provider: Home Office   I discussed the limitations of evaluation and management by  telemedicine and the availability of in person appointments. The patient expressed understanding and agreed to proceed.    History of Present Illness: TAKAKO MINCKLER is a 51 y.o. who identifies as a female who was assigned female at birth, and is being seen today for constipation.  HPI: Constipation This is a new problem. The current episode started in the past 7 days (3-4 days ago). The problem has been gradually worsening since onset. Her stool frequency is 1 time per day (would go 3-4 times per day now maybe once per day, but not relieving). The stool is described as pencil thin and watery. The patient is not on a high fiber diet. She Does not exercise regularly. There has Been adequate water intake. Associated symptoms include bloating, flatus, hemorrhoids and nausea. Pertinent negatives include no abdominal pain, diarrhea, fecal incontinence, fever, hematochezia, melena, rectal pain or vomiting. Risk factors include recent illness (added vitamins). She has tried laxatives and fiber (dulcolax, miralax) for the symptoms. The treatment provided no relief.   Had Covid 19 in February and has been taking vitamins for it that may have triggered.  Problems:  Patient Active Problem List   Diagnosis Date Noted   Acute bronchitis with COPD ([place]) 07/13/2021   Upper respiratory infection 07/13/2021   C. difficile diarrhea 07/04/2021   Gastroesophageal reflux disease without esophagitis 07/01/2020   Hyperlipidemia  07/01/2020   Posttraumatic vulvar hematoma 04/07/2020   Dizziness 03/29/2020   Fatty liver 12/24/2019   Angiolipoma of left kidney 12/24/2019   Chronic ITP (idiopathic thrombocytopenia) (HCC) 12/24/2019   Chronic ITP (idiopathic thrombocytopenic purpura) (HCC) 12/02/2019   Idiopathic thrombocytopenic purpura (ITP) ([place]) 11/10/2019   Verruca vulgaris 09/29/2019   Plantar fasciitis, bilateral 09/29/2019   Osteoarthritis of both knees 09/29/2019   Overactive bladder 07/14/2019   B12  deficiency 06/10/2019   Vitamin D deficiency 06/10/2019   Headache 01/30/2019   Elevated LFTs 01/30/2019   Abnormal uterine bleeding (AUB) 08/26/2018   Depression, major, single episode, moderate ([place]) 08/04/2018   Iron deficiency 09/02/2017   Menorrhagia with regular cycle 10/22/2016   Insomnia 08/15/2016   Nocturia 06/18/2016   Thrombocytopenia ([facility]) 02/12/2016   COPD exacerbation ([place]) 01/31/2016   Carpal tunnel syndrome 01/20/2016   Lateral epicondylitis 01/20/2016   Obesity 01/04/2016   Palpitations 12/15/2015   Myalgia 10/31/2015   Fatigue 10/31/2015   Mixed incontinence 03/09/2015   COPD (chronic obstructive pulmonary disease) ([place]) 01/13/2015   Acute bronchitis 11/15/2014   Female hirsutism 09/22/2014   Elevated testosterone level in female 09/22/2014   Generalized anxiety disorder 08/23/2014   Tobacco use disorder 08/23/2014   Obesity (BMI 30-39.9) 08/23/2014   HTN (hypertension) 08/23/2014    Allergies:  Allergies  Allergen Reactions   Meloxicam Nausea Only   Medications:  Current Outpatient Medications:    linaclotide (LINZESS) 145 MCG CAPS capsule, Take 1 capsule (145 mcg total) by mouth daily before breakfast., Disp: 30 capsule, Rfl: 0   acetaminophen (TYLENOL) 500 MG tablet, Take 500 mg by mouth every 6 (six) hours as needed., Disp: , Rfl:    albuterol (PROVENTIL) (2.5 MG/3ML) 0.083% nebulizer solution, Take 3 mLs (2.5 mg total) by nebulization every 6 (six) hours as needed for wheezing or shortness of breath., Disp: 150 mL, Rfl: 1   albuterol (VENTOLIN HFA) 108 (90 Base) MCG/ACT inhaler, INHALE 2 PUFFS INTO THE LUNGS EVERY 6 HOURS AS NEEDED FOR WHEEZING OR SHORTNESS OF BREATH (COUGH), Disp: 6.7 each, Rfl: 3   ALPRAZolam (XANAX) 0.25 MG tablet, Take 1 tablet (0.25 mg total) by mouth daily as needed for anxiety., Disp: 30 tablet, Rfl: 2   amLODipine (NORVASC) 10 MG tablet, Take 1 tablet (10 mg total) by mouth daily., Disp: 90 tablet, Rfl: 3   benzonatate  (TESSALON PERLES) 100 MG capsule, Take 1 capsule (100 mg total) by mouth 3 (three) times daily as needed for cough., Disp: 30 capsule, Rfl: 0   Budeson-Glycopyrrol-Formoterol (BREZTRI AEROSPHERE) 160-9-4.8 MCG/ACT AERO, Inhale 2 puffs into the lungs in the morning and at bedtime., Disp: 10.7 g, Rfl: 5   carvedilol (COREG) 25 MG tablet, Take 1 tablet (25 mg total) by mouth 2 (two) times daily with a meal., Disp: 180 tablet, Rfl: 3   cetirizine (ZYRTEC) 10 MG tablet, Take 1 tablet (10 mg total) by mouth daily., Disp: 90 tablet, Rfl: 3   Cholecalciferol 1.25 MG (50000 UT) capsule, Take 1 capsule (50,000 Units total) by mouth every 30 (thirty) days. 1x per month note this, Disp: 13 capsule, Rfl: 0   cyanocobalamin (,VITAMIN B-12,) 1000 MCG/ML injection, INJECT 1 ML (1,000 MCG TOTAL) INTO THE MUSCLE EVERY 30 DAYS., Disp: 1 mL, Rfl: 15   cyclobenzaprine (FLEXERIL) 10 MG tablet, Take 1 tablet (10 mg total) by mouth 3 (three) times daily as needed for muscle spasms. Will cause drowsiness do not drive or operate machinery while using, Disp: 15 tablet, Rfl: 0  diclofenac Sodium (VOLTAREN) 1 % GEL, Apply 2-4 g topically 4 (four) times daily. 2 grams upper body qid prn and 4 gram lower body qid prn, Disp: 150 g, Rfl: 11   dicyclomine (BENTYL) 10 MG capsule, TAKE 1 CAPSULE (10 MG TOTAL) BY MOUTH 3 (THREE) TIMES DAILY AS NEEDED FOR SPASMS. BEFORE FOOD, Disp: 90 capsule, Rfl: 11   escitalopram (LEXAPRO) 20 MG tablet, TAKE 1 TABLET BY MOUTH EVERY DAY, Disp: 90 tablet, Rfl: 3   fluticasone (FLONASE) 50 MCG/ACT nasal spray, Place 1 spray into both nostrils daily., Disp: 18.2 mL, Rfl: 2   guaiFENesin (MUCINEX) 600 MG 12 hr tablet, Take 1 tablet (600 mg total) by mouth 2 (two) times daily., Disp: 60 tablet, Rfl: 0   lidocaine (XYLOCAINE) 2 % solution, 75m swish and swallow every 4 hours as needed for sore throat, Disp: 100 mL, Rfl: 0   nicotine (NICOTROL) 10 MG inhaler, Inhale 1 Cartridge (1 continuous puffing total) into  the lungs as needed for smoking cessation (No more than 16 cartridges per day)., Disp: 36 each, Rfl: 1   omeprazole (PRILOSEC) 20 MG capsule, TAKE 1 CAPSULE BY MOUTH EVERY DAY, Disp: 90 capsule, Rfl: 3   ondansetron (ZOFRAN) 4 MG tablet, TAKE 1 TABLET BY MOUTH EVERY 8 HOURS AS NEEDED FOR NAUSEA AND VOMITING, Disp: 10 tablet, Rfl: 11   spironolactone (ALDACTONE) 25 MG tablet, Take 1 tablet (25 mg total) by mouth daily. In am, Disp: 90 tablet, Rfl: 3   Syringe/Needle, Disp, 25G X 1-1/2" 1 ML MISC, 1 Device by Does not apply route as directed., Disp: 30 each, Rfl: 11   traMADol (ULTRAM) 50 MG tablet, Take 1 tablet (50 mg total) by mouth every 12 (twelve) hours as needed., Disp: 60 tablet, Rfl: 0   traZODone (DESYREL) 50 MG tablet, TAKE 0.5-1 TABLETS BY MOUTH AT BEDTIME AS NEEDED FOR SLEEP., Disp: 90 tablet, Rfl: 3  Observations/Objective: Patient is well-developed, well-nourished in no acute distress.  Resting comfortably at home.  Head is normocephalic, atraumatic.  No labored breathing.  Speech is clear and coherent with logical content.  Patient is alert and oriented at baseline.    Assessment and Plan: 1. Drug-induced constipation - linaclotide (LINZESS) 145 MCG CAPS capsule; Take 1 capsule (145 mcg total) by mouth daily before breakfast.  Dispense: 30 capsule; Refill: 0  - Has tried some OTC options without relief - No worrisome findings at this time for SBO - Will add Linzess for constipation - Should follow up with PCP or GI specialist if symptoms persist - Seek immediate care at local ER or UC if symptoms worsen in the meantime  Follow Up Instructions: I discussed the assessment and treatment plan with the patient. The patient was provided an opportunity to ask questions and all were answered. The patient agreed with the plan and demonstrated an understanding of the instructions.  A copy of instructions were sent to the patient via MyChart unless otherwise noted below.   The  patient was advised to call back or seek an in-person evaluation if the symptoms worsen or if the condition fails to improve as anticipated.  Time:  I spent 15 minutes with the patient via telehealth technology discussing the above problems/concerns.    JMar Daring PA-C

## 2021-09-19 NOTE — Patient Instructions (Signed)
Lin Landsman, thank you for joining Mar Daring, PA-C for today's virtual visit.  While this provider is not your primary care provider (PCP), if your PCP is located in our provider database this encounter information will be shared with them immediately following your visit.  Consent: (Patient) Lin Landsman provided verbal consent for this virtual visit at the beginning of the encounter.  Current Medications:  Current Outpatient Medications:    linaclotide (LINZESS) 145 MCG CAPS capsule, Take 1 capsule (145 mcg total) by mouth daily before breakfast., Disp: 30 capsule, Rfl: 0   acetaminophen (TYLENOL) 500 MG tablet, Take 500 mg by mouth every 6 (six) hours as needed., Disp: , Rfl:    albuterol (PROVENTIL) (2.5 MG/3ML) 0.083% nebulizer solution, Take 3 mLs (2.5 mg total) by nebulization every 6 (six) hours as needed for wheezing or shortness of breath., Disp: 150 mL, Rfl: 1   albuterol (VENTOLIN HFA) 108 (90 Base) MCG/ACT inhaler, INHALE 2 PUFFS INTO THE LUNGS EVERY 6 HOURS AS NEEDED FOR WHEEZING OR SHORTNESS OF BREATH (COUGH), Disp: 6.7 each, Rfl: 3   ALPRAZolam (XANAX) 0.25 MG tablet, Take 1 tablet (0.25 mg total) by mouth daily as needed for anxiety., Disp: 30 tablet, Rfl: 2   amLODipine (NORVASC) 10 MG tablet, Take 1 tablet (10 mg total) by mouth daily., Disp: 90 tablet, Rfl: 3   benzonatate (TESSALON PERLES) 100 MG capsule, Take 1 capsule (100 mg total) by mouth 3 (three) times daily as needed for cough., Disp: 30 capsule, Rfl: 0   Budeson-Glycopyrrol-Formoterol (BREZTRI AEROSPHERE) 160-9-4.8 MCG/ACT AERO, Inhale 2 puffs into the lungs in the morning and at bedtime., Disp: 10.7 g, Rfl: 5   carvedilol (COREG) 25 MG tablet, Take 1 tablet (25 mg total) by mouth 2 (two) times daily with a meal., Disp: 180 tablet, Rfl: 3   cetirizine (ZYRTEC) 10 MG tablet, Take 1 tablet (10 mg total) by mouth daily., Disp: 90 tablet, Rfl: 3   Cholecalciferol 1.25 MG (50000 UT) capsule, Take 1 capsule  (50,000 Units total) by mouth every 30 (thirty) days. 1x per month note this, Disp: 13 capsule, Rfl: 0   cyanocobalamin (,VITAMIN B-12,) 1000 MCG/ML injection, INJECT 1 ML (1,000 MCG TOTAL) INTO THE MUSCLE EVERY 30 DAYS., Disp: 1 mL, Rfl: 15   cyclobenzaprine (FLEXERIL) 10 MG tablet, Take 1 tablet (10 mg total) by mouth 3 (three) times daily as needed for muscle spasms. Will cause drowsiness do not drive or operate machinery while using, Disp: 15 tablet, Rfl: 0   diclofenac Sodium (VOLTAREN) 1 % GEL, Apply 2-4 g topically 4 (four) times daily. 2 grams upper body qid prn and 4 gram lower body qid prn, Disp: 150 g, Rfl: 11   dicyclomine (BENTYL) 10 MG capsule, TAKE 1 CAPSULE (10 MG TOTAL) BY MOUTH 3 (THREE) TIMES DAILY AS NEEDED FOR SPASMS. BEFORE FOOD, Disp: 90 capsule, Rfl: 11   escitalopram (LEXAPRO) 20 MG tablet, TAKE 1 TABLET BY MOUTH EVERY DAY, Disp: 90 tablet, Rfl: 3   fluticasone (FLONASE) 50 MCG/ACT nasal spray, Place 1 spray into both nostrils daily., Disp: 18.2 mL, Rfl: 2   guaiFENesin (MUCINEX) 600 MG 12 hr tablet, Take 1 tablet (600 mg total) by mouth 2 (two) times daily., Disp: 60 tablet, Rfl: 0   lidocaine (XYLOCAINE) 2 % solution, 70m swish and swallow every 4 hours as needed for sore throat, Disp: 100 mL, Rfl: 0   nicotine (NICOTROL) 10 MG inhaler, Inhale 1 Cartridge (1 continuous puffing total) into the  lungs as needed for smoking cessation (No more than 16 cartridges per day)., Disp: 36 each, Rfl: 1   omeprazole (PRILOSEC) 20 MG capsule, TAKE 1 CAPSULE BY MOUTH EVERY DAY, Disp: 90 capsule, Rfl: 3   ondansetron (ZOFRAN) 4 MG tablet, TAKE 1 TABLET BY MOUTH EVERY 8 HOURS AS NEEDED FOR NAUSEA AND VOMITING, Disp: 10 tablet, Rfl: 11   spironolactone (ALDACTONE) 25 MG tablet, Take 1 tablet (25 mg total) by mouth daily. In am, Disp: 90 tablet, Rfl: 3   Syringe/Needle, Disp, 25G X 1-1/2" 1 ML MISC, 1 Device by Does not apply route as directed., Disp: 30 each, Rfl: 11   traMADol (ULTRAM) 50 MG  tablet, Take 1 tablet (50 mg total) by mouth every 12 (twelve) hours as needed., Disp: 60 tablet, Rfl: 0   traZODone (DESYREL) 50 MG tablet, TAKE 0.5-1 TABLETS BY MOUTH AT BEDTIME AS NEEDED FOR SLEEP., Disp: 90 tablet, Rfl: 3   Medications ordered in this encounter:  Meds ordered this encounter  Medications   linaclotide (LINZESS) 145 MCG CAPS capsule    Sig: Take 1 capsule (145 mcg total) by mouth daily before breakfast.    Dispense:  30 capsule    Refill:  0    Order Specific Question:   Supervising Provider    Answer:   Noemi Chapel [[id number]]     *If you need refills on other medications prior to your next appointment, please contact your pharmacy*  Follow-Up: Call back or seek an in-person evaluation if the symptoms worsen or if the condition fails to improve as anticipated.  Other Instructions Constipation, Adult Constipation is when a person has fewer than three bowel movements in a week, has difficulty having a bowel movement, or has stools (feces) that are dry, hard, or larger than normal. Constipation may be caused by an underlying condition. It may become worse with age if a person takes certain medicines and does not take in enough fluids. Follow these instructions at home: Eating and drinking  Eat foods that have a lot of fiber, such as beans, whole grains, and fresh fruits and vegetables. Limit foods that are low in fiber and high in fat and processed sugars, such as fried or sweet foods. These include french fries, hamburgers, cookies, candies, and soda. Drink enough fluid to keep your urine pale yellow. General instructions Exercise regularly or as told by your health care provider. Try to do 150 minutes of moderate exercise each week. Use the bathroom when you have the urge to go. Do not hold it in. Take over-the-counter and prescription medicines only as told by your health care provider. This includes any fiber supplements. During bowel movements: Practice deep  breathing while relaxing the lower abdomen. Practice pelvic floor relaxation. Watch your condition for any changes. Let your health care provider know about them. Keep all follow-up visits as told by your health care provider. This is important. Contact a health care provider if: You have pain that gets worse. You have a fever. You do not have a bowel movement after 4 days. You vomit. You are not hungry or you lose weight. You are bleeding from the opening between the buttocks (anus). You have thin, pencil-like stools. Get help right away if: You have a fever and your symptoms suddenly get worse. You leak stool or have blood in your stool. Your abdomen is bloated. You have severe pain in your abdomen. You feel dizzy or you faint. Summary Constipation is when a person has fewer than  three bowel movements in a week, has difficulty having a bowel movement, or has stools (feces) that are dry, hard, or larger than normal. Eat foods that have a lot of fiber, such as beans, whole grains, and fresh fruits and vegetables. Drink enough fluid to keep your urine pale yellow. Take over-the-counter and prescription medicines only as told by your health care provider. This includes any fiber supplements. This information is not intended to replace advice given to you by your health care provider. Make sure you discuss any questions you have with your health care provider. Document Revised: 05/20/2019 Document Reviewed: 05/20/2019 Elsevier Patient Education  2022 [place] Oral Capsules What is this medication? LINACLOTIDE (lin a KLOE tide) treats irritable bowel syndrome (IBS) with constipation as the main problem. It is also used for relief of chronic constipation. This medicine may be used for other purposes; ask your health care provider or pharmacist if you have questions. COMMON BRAND NAME(S): Linzess What should I tell my care team before I take this  medication? They need to know if you have any of these conditions: diarrhea history of blockage in your bowels history of stool (fecal) impaction an unusual or allergic reaction to linaclotide, other medicines, foods, dyes, or preservatives pregnant or trying to get pregnant breast-feeding How should I use this medication? Take this medicine by mouth with water. Take it as directed on the prescription label at the same time every day. Do not cut, crush or chew this medicine. Swallow the capsules whole. Take it on an empty stomach, at least 30 minutes before your first meal of the day. Keep taking it unless your health care provider tells you to stop. A special MedGuide will be given to you by the pharmacist with each prescription and refill. Be sure to read this information carefully each time. Talk to your health care provider about the use of this medicine in children. It is not approved for use in children. Overdosage: If you think you have taken too much of this medicine contact a poison control center or emergency room at once. NOTE: This medicine is only for you. Do not share this medicine with others. What if I miss a dose? If you miss a dose, skip it. Take your next dose at the normal time. Do not take extra or 2 doses at the same time to make up for the missed dose. What may interact with this medication? Interactions have not been studied. This list may not describe all possible interactions. Give your health care provider a list of all the medicines, herbs, non-prescription drugs, or dietary supplements you use. Also tell them if you smoke, drink alcohol, or use illegal drugs. Some items may interact with your medicine. What should I watch for while using this medication? Visit your health care provider for regular checks on your progress. Tell your health care provider if your symptoms do not start to get better or if they get worse. Diarrhea is a common side effect of this medicine.  It often begins within 2 weeks of starting this medicine. Stop taking this medicine and call your doctor if you get severe diarrhea. Stop taking this medicine and call your doctor or go to the nearest hospital emergency room right away if you develop unusual or severe stomach pain, especially if you also have bright red, bloody stools or black stools that look like tar. What side effects may I notice from receiving this medication? Side effects that you  should report to your doctor or health care professional as soon as possible: allergic reactions (skin rash, itching or hives; swelling of the face, lips, or tongue) black, tarry stools bloody or watery diarrhea new or worsening stomach pain severe or prolonged diarrhea Side effects that usually do not require medical attention (report to your doctor or health care professional if they continue or are bothersome): bloating loose stools passing gas This list may not describe all possible side effects. Call your doctor for medical advice about side effects. You may report side effects to FDA at 1-800-FDA-1088. Where should I keep my medication? Keep out of the reach of children and pets. Store at room temperature at 25 degrees C (77 degrees F). Keep this medicine in the original container. Protect from moisture. Keep the container tightly closed. Do not throw out the packet in the container. It keeps the medicine dry. Get rid of any unused medicine after the expiration date. To get rid of medicines that are no longer needed or have expired: Take the medicine to a medicine take-back program. Check with your pharmacy or law enforcement to find a location. If you cannot return the medicine, check the label or package insert to see if the medicine should be thrown out in the garbage or flushed down the toilet. If you are not sure, ask your health care provider. If it is safe to put it in the trash, take the medicine out of the container. Mix the medicine  with cat litter, dirt, coffee grounds, or other unwanted substance. Seal the mixture in a bag or container. Put it in the trash. NOTE: This sheet is a summary. It may not cover all possible information. If you have questions about this medicine, talk to your doctor, pharmacist, or health care provider.  2022 Elsevier/Gold Standard (2020-07-29 00:00:00)    If you have been instructed to have an in-person evaluation today at a local Urgent Care facility, please use the link below. It will take you to a list of all of our available [facility], including address, phone number and hours of operation. Please do not delay care.  [facility]  If you or a family member do not have a primary care provider, use the link below to schedule a visit and establish care. When you choose a [facility] primary care physician or advanced practice provider, you gain a long-term partner in health. Find a Primary Care Provider  Learn more about 's in-office and virtual care options: [facility]

## 2021-09-20 ENCOUNTER — Other Ambulatory Visit: Payer: Self-pay | Admitting: Physician Assistant

## 2021-09-20 DIAGNOSIS — K5903 Drug induced constipation: Secondary | ICD-10-CM

## 2021-09-20 MED ORDER — TRULANCE 3 MG PO TABS: 3.0000 mg | ORAL_TABLET | Freq: Every day | ORAL | 0 refills | Status: DC

## 2021-09-20 NOTE — Progress Notes (Signed)
Linzess not covered. Trulance prescribed ?

## 2021-10-03 ENCOUNTER — Other Ambulatory Visit: Payer: Self-pay

## 2021-10-03 ENCOUNTER — Inpatient Hospital Stay: Payer: BC Managed Care – PPO

## 2021-10-03 ENCOUNTER — Inpatient Hospital Stay: Payer: BC Managed Care – PPO | Attending: Oncology

## 2021-10-03 DIAGNOSIS — D693 Immune thrombocytopenic purpura: Secondary | ICD-10-CM | POA: Insufficient documentation

## 2021-10-03 LAB — CBC WITH DIFFERENTIAL/PLATELET
Abs Immature Granulocytes: 0.03 10*3/uL (ref 0.00–0.07)
Basophils Absolute: 0.1 10*3/uL (ref 0.0–0.1)
Basophils Relative: 1 %
Eosinophils Absolute: 0.3 10*3/uL (ref 0.0–0.5)
Eosinophils Relative: 3 %
HCT: 44.4 % (ref 36.0–46.0)
Hemoglobin: 15.1 g/dL — ABNORMAL HIGH (ref 12.0–15.0)
Immature Granulocytes: 0 %
Lymphocytes Relative: 26 %
Lymphs Abs: 2.8 10*3/uL (ref 0.7–4.0)
MCH: 33.6 pg (ref 26.0–34.0)
MCHC: 34 g/dL (ref 30.0–36.0)
MCV: 98.7 fL (ref 80.0–100.0)
Monocytes Absolute: 0.9 10*3/uL (ref 0.1–1.0)
Monocytes Relative: 8 %
Neutro Abs: 6.7 10*3/uL (ref 1.7–7.7)
Neutrophils Relative %: 62 %
Platelets: 40 10*3/uL — ABNORMAL LOW (ref 150–400)
RBC: 4.5 MIL/uL (ref 3.87–5.11)
RDW: 12.1 % (ref 11.5–15.5)
WBC: 10.8 10*3/uL — ABNORMAL HIGH (ref 4.0–10.5)
nRBC: 0 % (ref 0.0–0.2)

## 2021-10-03 MED ORDER — ROMIPLOSTIM INJECTION 500 MCG
455.0000 ug | Freq: Once | SUBCUTANEOUS | Status: AC
  Administered 2021-10-03: 455 ug via SUBCUTANEOUS
  Filled 2021-10-03: qty 0.5

## 2021-10-03 NOTE — Telephone Encounter (Signed)
Not prescriber

## 2021-10-05 ENCOUNTER — Other Ambulatory Visit: Payer: Self-pay

## 2021-10-05 ENCOUNTER — Encounter: Payer: Self-pay | Admitting: Internal Medicine

## 2021-10-05 DIAGNOSIS — J309 Allergic rhinitis, unspecified: Secondary | ICD-10-CM

## 2021-10-05 MED ORDER — CETIRIZINE HCL 10 MG PO TABS: 10.0000 mg | ORAL_TABLET | Freq: Every day | ORAL | 3 refills | Status: DC

## 2021-10-10 ENCOUNTER — Telehealth: Payer: BC Managed Care – PPO | Admitting: Family Medicine

## 2021-10-10 ENCOUNTER — Encounter: Payer: Self-pay | Admitting: Pulmonary Disease

## 2021-10-10 DIAGNOSIS — J441 Chronic obstructive pulmonary disease with (acute) exacerbation: Secondary | ICD-10-CM

## 2021-10-10 MED ORDER — PROMETHAZINE-DM 6.25-15 MG/5ML PO SYRP: 2.5000 mL | ORAL_SOLUTION | Freq: Four times a day (QID) | ORAL | 0 refills | Status: DC | PRN

## 2021-10-10 MED ORDER — PREDNISONE 10 MG PO TABS: ORAL_TABLET | ORAL | 0 refills | Status: DC

## 2021-10-10 NOTE — Telephone Encounter (Signed)
Dr. Gonzalez, please advise. Thanks 

## 2021-10-10 NOTE — Patient Instructions (Signed)

## 2021-10-10 NOTE — Telephone Encounter (Signed)
I see that she had a virtual visit with the primary care nurse practitioner.  I have reviewed that note and her management was appropriate.  I suspect that possibly the allergen count is what is triggering her.  Follow the instructions as per the primary care nurse practitioner. ?

## 2021-10-10 NOTE — Progress Notes (Signed)
?Virtual Visit Consent  ? ?Lin Landsman, you are scheduled for a virtual visit with a [facility] provider today.   ?  ?Just as with appointments in the office, your consent must be obtained to participate.  Your consent will be active for this visit and any virtual visit you may have with one of our providers in the next 365 days.   ?  ?If you have a MyChart account, a copy of this consent can be sent to you electronically.  All virtual visits are billed to your insurance company just like a traditional visit in the office.   ? ?As this is a virtual visit, video technology does not allow for your provider to perform a traditional examination.  This may limit your provider's ability to fully assess your condition.  If your provider identifies any concerns that need to be evaluated in person or the need to arrange testing (such as labs, EKG, etc.), we will make arrangements to do so.   ?  ?Although advances in technology are sophisticated, we cannot ensure that it will always work on either your end or our end.  If the connection with a video visit is poor, the visit may have to be switched to a telephone visit.  With either a video or telephone visit, we are not always able to ensure that we have a secure connection.    ? ?I need to obtain your verbal consent now.   Are you willing to proceed with your visit today?  ?  ?MURREL FREET has provided verbal consent on 10/10/2021 for a virtual visit (video or telephone). ?  ?Perlie Mayo, NP  ? ?Date: 10/10/2021 1:28 PM ? ? ?Virtual Visit via Video Note  ? ?Chelsea Aus, connected with  YOLANDA HUFFSTETLER  ([id number], [birth year]) on 10/10/21 at  1:30 PM EDT by a video-enabled telemedicine application and verified that I am speaking with the correct person using two identifiers. ? ?Location: ?Patient: Virtual Visit Location Patient: Home ?Provider: Virtual Visit Location Provider: Home Office ?  ?I discussed the limitations of evaluation and management by telemedicine  and the availability of in person appointments. The patient expressed understanding and agreed to proceed.   ? ?History of Present Illness: ?Carly [name] is a 51 y.o. who identifies as a female who was assigned female at birth, and is being seen today for COPD exacerbation. Is taking her COPD medications as directed Started Mucinex last night. Reports no improvement. Has messaged her Pulmonary provider but has not heard back. Her sister past away last week and she said she started smoking a lot, and she knows she shouldn't but she couldn't help it with the stress. She has increased cough a with chest tightness. Symptoms have continued for 3-4 days now. Non productive cough. Shortness of breath with movement, some wheezing at times. No URI symptoms. Does not know oxygen levels. Cough is worse at night- perles are not helpful she reports.  ? ? ?Problems:  ?Patient Active Problem List  ? Diagnosis Date Noted  ? Acute bronchitis with COPD ([facility]) 07/13/2021  ? Upper respiratory infection 07/13/2021  ? C. difficile diarrhea 07/04/2021  ? Gastroesophageal reflux disease without esophagitis 07/01/2020  ? Hyperlipidemia 07/01/2020  ? Posttraumatic vulvar hematoma 04/07/2020  ? Dizziness 03/29/2020  ? Fatty liver 12/24/2019  ? Angiolipoma of left kidney 12/24/2019  ? Chronic ITP (idiopathic thrombocytopenia) (HCC) 12/24/2019  ? Chronic ITP (idiopathic thrombocytopenic purpura) (HCC) 12/02/2019  ? Idiopathic thrombocytopenic  purpura (ITP) ([place]) 11/10/2019  ? Verruca vulgaris 09/29/2019  ? Plantar fasciitis, bilateral 09/29/2019  ? Osteoarthritis of both knees 09/29/2019  ? Overactive bladder 07/14/2019  ? B12 deficiency 06/10/2019  ? Vitamin D deficiency 06/10/2019  ? Headache 01/30/2019  ? Elevated LFTs 01/30/2019  ? Abnormal uterine bleeding (AUB) 08/26/2018  ? Depression, major, single episode, moderate ([place]) 08/04/2018  ? Iron deficiency 09/02/2017  ? Menorrhagia with regular cycle 10/22/2016  ? Insomnia 08/15/2016  ?  Nocturia 06/18/2016  ? Thrombocytopenia ([place]) 02/12/2016  ? COPD exacerbation ([place]) 01/31/2016  ? Carpal tunnel syndrome 01/20/2016  ? Lateral epicondylitis 01/20/2016  ? Obesity 01/04/2016  ? Palpitations 12/15/2015  ? Myalgia 10/31/2015  ? Fatigue 10/31/2015  ? Mixed incontinence 03/09/2015  ? COPD (chronic obstructive pulmonary disease) ([place]) 01/13/2015  ? Acute bronchitis 11/15/2014  ? Female hirsutism 09/22/2014  ? Elevated testosterone level in female 09/22/2014  ? Generalized anxiety disorder 08/23/2014  ? Tobacco use disorder 08/23/2014  ? Obesity (BMI 30-39.9) 08/23/2014  ? HTN (hypertension) 08/23/2014  ?  ?Allergies:  ?Allergies  ?Allergen Reactions  ? Meloxicam Nausea Only  ? ?Medications:  ?Current Outpatient Medications:  ?  acetaminophen (TYLENOL) 500 MG tablet, Take 500 mg by mouth every 6 (six) hours as needed., Disp: , Rfl:  ?  albuterol (PROVENTIL) (2.5 MG/3ML) 0.083% nebulizer solution, Take 3 mLs (2.5 mg total) by nebulization every 6 (six) hours as needed for wheezing or shortness of breath., Disp: 150 mL, Rfl: 1 ?  albuterol (VENTOLIN HFA) 108 (90 Base) MCG/ACT inhaler, INHALE 2 PUFFS INTO THE LUNGS EVERY 6 HOURS AS NEEDED FOR WHEEZING OR SHORTNESS OF BREATH (COUGH), Disp: 6.7 each, Rfl: 3 ?  ALPRAZolam (XANAX) 0.25 MG tablet, Take 1 tablet (0.25 mg total) by mouth daily as needed for anxiety., Disp: 30 tablet, Rfl: 2 ?  amLODipine (NORVASC) 10 MG tablet, Take 1 tablet (10 mg total) by mouth daily., Disp: 90 tablet, Rfl: 3 ?  benzonatate (TESSALON PERLES) 100 MG capsule, Take 1 capsule (100 mg total) by mouth 3 (three) times daily as needed for cough., Disp: 30 capsule, Rfl: 0 ?  Budeson-Glycopyrrol-Formoterol (BREZTRI AEROSPHERE) 160-9-4.8 MCG/ACT AERO, Inhale 2 puffs into the lungs in the morning and at bedtime., Disp: 10.7 g, Rfl: 5 ?  carvedilol (COREG) 25 MG tablet, Take 1 tablet (25 mg total) by mouth 2 (two) times daily with a meal., Disp: 180 tablet, Rfl: 3 ?  cetirizine (ZYRTEC) 10 MG  tablet, Take 1 tablet (10 mg total) by mouth daily., Disp: 90 tablet, Rfl: 3 ?  Cholecalciferol 1.25 MG (50000 UT) capsule, Take 1 capsule (50,000 Units total) by mouth every 30 (thirty) days. 1x per month note this, Disp: 13 capsule, Rfl: 0 ?  cyanocobalamin (,VITAMIN B-12,) 1000 MCG/ML injection, INJECT 1 ML (1,000 MCG TOTAL) INTO THE MUSCLE EVERY 30 DAYS., Disp: 1 mL, Rfl: 15 ?  cyclobenzaprine (FLEXERIL) 10 MG tablet, Take 1 tablet (10 mg total) by mouth 3 (three) times daily as needed for muscle spasms. Will cause drowsiness do not drive or operate machinery while using, Disp: 15 tablet, Rfl: 0 ?  diclofenac Sodium (VOLTAREN) 1 % GEL, Apply 2-4 g topically 4 (four) times daily. 2 grams upper body qid prn and 4 gram lower body qid prn, Disp: 150 g, Rfl: 11 ?  dicyclomine (BENTYL) 10 MG capsule, TAKE 1 CAPSULE (10 MG TOTAL) BY MOUTH 3 (THREE) TIMES DAILY AS NEEDED FOR SPASMS. BEFORE FOOD, Disp: 90 capsule, Rfl: 11 ?  escitalopram (LEXAPRO)  20 MG tablet, TAKE 1 TABLET BY MOUTH EVERY DAY, Disp: 90 tablet, Rfl: 3 ?  fluticasone (FLONASE) 50 MCG/ACT nasal spray, Place 1 spray into both nostrils daily., Disp: 18.2 mL, Rfl: 2 ?  guaiFENesin (MUCINEX) 600 MG 12 hr tablet, Take 1 tablet (600 mg total) by mouth 2 (two) times daily., Disp: 60 tablet, Rfl: 0 ?  lidocaine (XYLOCAINE) 2 % solution, 23m swish and swallow every 4 hours as needed for sore throat, Disp: 100 mL, Rfl: 0 ?  nicotine (NICOTROL) 10 MG inhaler, Inhale 1 Cartridge (1 continuous puffing total) into the lungs as needed for smoking cessation (No more than 16 cartridges per day)., Disp: 36 each, Rfl: 1 ?  omeprazole (PRILOSEC) 20 MG capsule, TAKE 1 CAPSULE BY MOUTH EVERY DAY, Disp: 90 capsule, Rfl: 3 ?  ondansetron (ZOFRAN) 4 MG tablet, TAKE 1 TABLET BY MOUTH EVERY 8 HOURS AS NEEDED FOR NAUSEA AND VOMITING, Disp: 10 tablet, Rfl: 11 ?  Plecanatide (TRULANCE) 3 MG TABS, Take 3 mg by mouth daily., Disp: 30 tablet, Rfl: 0 ?  spironolactone (ALDACTONE) 25 MG  tablet, Take 1 tablet (25 mg total) by mouth daily. In am, Disp: 90 tablet, Rfl: 3 ?  Syringe/Needle, Disp, 25G X 1-1/2" 1 ML MISC, 1 Device by Does not apply route as directed., Disp: 30 each, Rfl: 11 ?

## 2021-10-16 ENCOUNTER — Encounter: Payer: Self-pay | Admitting: Internal Medicine

## 2021-10-16 NOTE — Telephone Encounter (Signed)
Please advise on referral for grief counseling  ?

## 2021-10-17 ENCOUNTER — Ambulatory Visit: Payer: BC Managed Care – PPO | Admitting: Podiatry

## 2021-10-31 ENCOUNTER — Inpatient Hospital Stay: Payer: BC Managed Care – PPO | Attending: Oncology

## 2021-10-31 ENCOUNTER — Inpatient Hospital Stay: Payer: BC Managed Care – PPO

## 2021-10-31 VITALS — Wt 198.0 lb

## 2021-10-31 DIAGNOSIS — D693 Immune thrombocytopenic purpura: Secondary | ICD-10-CM | POA: Insufficient documentation

## 2021-10-31 LAB — CBC WITH DIFFERENTIAL/PLATELET
Abs Immature Granulocytes: 0.03 10*3/uL (ref 0.00–0.07)
Basophils Absolute: 0.1 10*3/uL (ref 0.0–0.1)
Basophils Relative: 1 %
Eosinophils Absolute: 0.2 10*3/uL (ref 0.0–0.5)
Eosinophils Relative: 2 %
HCT: 45.7 % (ref 36.0–46.0)
Hemoglobin: 15.3 g/dL — ABNORMAL HIGH (ref 12.0–15.0)
Immature Granulocytes: 0 %
Lymphocytes Relative: 23 %
Lymphs Abs: 2.6 10*3/uL (ref 0.7–4.0)
MCH: 33.6 pg (ref 26.0–34.0)
MCHC: 33.5 g/dL (ref 30.0–36.0)
MCV: 100.2 fL — ABNORMAL HIGH (ref 80.0–100.0)
Monocytes Absolute: 1 10*3/uL (ref 0.1–1.0)
Monocytes Relative: 8 %
Neutro Abs: 7.5 10*3/uL (ref 1.7–7.7)
Neutrophils Relative %: 66 %
Platelets: 46 10*3/uL — ABNORMAL LOW (ref 150–400)
RBC: 4.56 MIL/uL (ref 3.87–5.11)
RDW: 12.6 % (ref 11.5–15.5)
Smear Review: DECREASED
WBC: 11.3 10*3/uL — ABNORMAL HIGH (ref 4.0–10.5)
nRBC: 0 % (ref 0.0–0.2)

## 2021-10-31 MED ORDER — ROMIPLOSTIM INJECTION 500 MCG
4.0000 ug/kg | Freq: Once | SUBCUTANEOUS | Status: AC
  Administered 2021-10-31: 360 ug via SUBCUTANEOUS
  Filled 2021-10-31: qty 0.72

## 2021-11-08 ENCOUNTER — Encounter: Payer: Self-pay | Admitting: Internal Medicine

## 2021-11-08 ENCOUNTER — Ambulatory Visit
Admission: RE | Admit: 2021-11-08 | Discharge: 2021-11-08 | Disposition: A | Discharge status: Home / Self Care | Payer: BC Managed Care – PPO | Source: Ambulatory Visit | Attending: Internal Medicine | Admitting: Internal Medicine

## 2021-11-08 ENCOUNTER — Ambulatory Visit (INDEPENDENT_AMBULATORY_CARE_PROVIDER_SITE_OTHER): Payer: BC Managed Care – PPO | Admitting: Internal Medicine

## 2021-11-08 VITALS — BP 130/80 | HR 86 | Temp 98.1°F | Resp 14 | Ht 62.0 in | Wt 203.4 lb

## 2021-11-08 DIAGNOSIS — G8929 Other chronic pain: Secondary | ICD-10-CM | POA: Diagnosis not present

## 2021-11-08 DIAGNOSIS — M25562 Pain in left knee: Secondary | ICD-10-CM | POA: Diagnosis not present

## 2021-11-08 DIAGNOSIS — M79605 Pain in left leg: Secondary | ICD-10-CM | POA: Diagnosis not present

## 2021-11-08 DIAGNOSIS — M25561 Pain in right knee: Secondary | ICD-10-CM | POA: Insufficient documentation

## 2021-11-08 NOTE — Addendum Note (Signed)
Addended by: Orland Mustard on: 11/08/2021 01:30 PM ? ? Modules accepted: Orders ? ?

## 2021-11-08 NOTE — Telephone Encounter (Signed)
LMTCB to see fi I could get her scheduled for an appointment.  ?

## 2021-11-08 NOTE — Progress Notes (Signed)
Chief Complaint  ?Patient presents with  ? Acute Visit  ?  Calf pain in both legs, ongoing for wks. Worse on L leg mostly under knee c/o throbbing sensation, and hurts to walk. Also having tingling in foot.  ? ?F/u  ?1. C/o left posterior knee calf pain L>right woke up this am and worse throbbing hurt to walk 6/10 pain currently with standing Monday-Saturday at work worse and 3-4/10 at times at rest tried biofreeze w/o much relief  ?No injury  ?Has foot tingling that comes and goes at well  ? ? ?Review of Systems  ?Constitutional:  Negative for weight loss.  ?HENT:  Negative for hearing loss.   ?Eyes:  Negative for blurred vision.  ?Respiratory:  Negative for shortness of breath.   ?Cardiovascular:  Negative for chest pain.  ?Gastrointestinal:  Negative for abdominal pain and blood in stool.  ?Genitourinary:  Negative for dysuria.  ?Musculoskeletal:  Positive for joint pain. Negative for falls.  ?Skin:  Negative for rash.  ?Neurological:  Negative for headaches.  ?Psychiatric/Behavioral:  Negative for depression.   ?Past Medical History:  ?Diagnosis Date  ? Anxiety   ? C. difficile diarrhea   ? 07/04/21  ? Chicken pox   ? Depression   ? Elevated testosterone level in female   ? Emphysema of lung ([place])   ? bronchitis  ? Frequent headaches   ? Hay fever   ? Hypertension   ? Idiopathic thrombocytopenic purpura (ITP) (HCC)   ? Positive ANA (antinuclear antibody)   ? ?Past Surgical History:  ?Procedure Laterality Date  ? HEMATOMA EVACUATION N/A 04/07/2020  ? Procedure: EVACUATION HEMATOMA  AND APPLICATION OF PRESSURE DRESSING;  Surgeon: Benjaman Kindler, MD;  Location: [facility] ORS;  Service: Gynecology;  Laterality: N/A;  ? HYSTEROSCOPY WITH NOVASURE N/A 02/21/2018  ? Procedure: HYSTEROSCOPY WITH NOVASURE, POLYPECTOMY;  Surgeon: Benjaman Kindler, MD;  Location: [facility] ORS;  Service: Gynecology;  Laterality: N/A;  ? TUBAL LIGATION  1997  ? TUBAL LIGATION    ? ?Family History  ?Problem Relation Age of Onset  ? Hyperlipidemia  Mother   ? Heart disease Mother   ? Stroke Mother   ? Depression Mother   ? Mental illness Mother   ? Heart attack Mother 30  ? Hyperlipidemia Father   ? Depression Father   ? Mental illness Father   ? Diabetes Paternal Grandfather   ? Cancer Cousin   ? ?Social History  ? ?Socioeconomic History  ? Marital status: Married  ?  Spouse name: Not on file  ? Number of children: Not on file  ? Years of education: Not on file  ? Highest education level: Not on file  ?Occupational History  ? Not on file  ?Tobacco Use  ? Smoking status: Some Days  ?  Packs/day: 1.50  ?  Years: 28.00  ?  Pack years: 42.00  ?  Types: Cigarettes  ? Smokeless tobacco: Never  ? Tobacco comments:  ?  Patient reports 3-4 PPD.  ?Vaping Use  ? Vaping Use: Never used  ?Substance and Sexual Activity  ? Alcohol use: Yes  ?  Alcohol/week: 8.0 standard drinks  ?  Types: 8 Standard drinks or equivalent per week  ?  Comment: Beer (Can)   ? Drug use: No  ?  Comment: CBD oil  ? Sexual activity: Yes  ?  Partners: Male  ?Other Topics Concern  ? Not on file  ?Social History Narrative  ? 1 Year college   ? Works  in a Warehouse- [employer]  ? Married to [place]   ? Enjoys coloring   ? Pets: Dog lives inside  ? Children: 38 Son 30 Son  ? ?Social Determinants of Health  ? ?Financial Resource Strain: Not on file  ?Food Insecurity: Not on file  ?Transportation Needs: Not on file  ?Physical Activity: Not on file  ?Stress: Not on file  ?Social Connections: Not on file  ?Intimate Partner Violence: Not on file  ? ?Current Meds  ?Medication Sig  ? acetaminophen (TYLENOL) 500 MG tablet Take 500 mg by mouth every 6 (six) hours as needed.  ? albuterol (PROVENTIL) (2.5 MG/3ML) 0.083% nebulizer solution Take 3 mLs (2.5 mg total) by nebulization every 6 (six) hours as needed for wheezing or shortness of breath.  ? albuterol (VENTOLIN HFA) 108 (90 Base) MCG/ACT inhaler INHALE 2 PUFFS INTO THE LUNGS EVERY 6 HOURS AS NEEDED FOR WHEEZING OR SHORTNESS OF BREATH (COUGH)  ?  ALPRAZolam (XANAX) 0.25 MG tablet Take 1 tablet (0.25 mg total) by mouth daily as needed for anxiety.  ? amLODipine (NORVASC) 10 MG tablet Take 1 tablet (10 mg total) by mouth daily.  ? benzonatate (TESSALON PERLES) 100 MG capsule Take 1 capsule (100 mg total) by mouth 3 (three) times daily as needed for cough.  ? Budeson-Glycopyrrol-Formoterol (BREZTRI AEROSPHERE) 160-9-4.8 MCG/ACT AERO Inhale 2 puffs into the lungs in the morning and at bedtime.  ? carvedilol (COREG) 25 MG tablet Take 1 tablet (25 mg total) by mouth 2 (two) times daily with a meal.  ? cetirizine (ZYRTEC) 10 MG tablet Take 1 tablet (10 mg total) by mouth daily.  ? Cholecalciferol 1.25 MG (50000 UT) capsule Take 1 capsule (50,000 Units total) by mouth every 30 (thirty) days. 1x per month note this  ? cyanocobalamin (,VITAMIN B-12,) 1000 MCG/ML injection INJECT 1 ML (1,000 MCG TOTAL) INTO THE MUSCLE EVERY 30 DAYS.  ? diclofenac Sodium (VOLTAREN) 1 % GEL Apply 2-4 g topically 4 (four) times daily. 2 grams upper body qid prn and 4 gram lower body qid prn  ? dicyclomine (BENTYL) 10 MG capsule TAKE 1 CAPSULE (10 MG TOTAL) BY MOUTH 3 (THREE) TIMES DAILY AS NEEDED FOR SPASMS. BEFORE FOOD  ? escitalopram (LEXAPRO) 20 MG tablet TAKE 1 TABLET BY MOUTH EVERY DAY  ? fluticasone (FLONASE) 50 MCG/ACT nasal spray Place 1 spray into both nostrils daily.  ? guaiFENesin (MUCINEX) 600 MG 12 hr tablet Take 1 tablet (600 mg total) by mouth 2 (two) times daily.  ? nicotine (NICOTROL) 10 MG inhaler Inhale 1 Cartridge (1 continuous puffing total) into the lungs as needed for smoking cessation (No more than 16 cartridges per day).  ? omeprazole (PRILOSEC) 20 MG capsule TAKE 1 CAPSULE BY MOUTH EVERY DAY  ? ondansetron (ZOFRAN) 4 MG tablet TAKE 1 TABLET BY MOUTH EVERY 8 HOURS AS NEEDED FOR NAUSEA AND VOMITING  ? spironolactone (ALDACTONE) 25 MG tablet Take 1 tablet (25 mg total) by mouth daily. In am  ? Syringe/Needle, Disp, 25G X 1-1/2" 1 ML MISC 1 Device by Does not apply  route as directed.  ? traZODone (DESYREL) 50 MG tablet TAKE 0.5-1 TABLETS BY MOUTH AT BEDTIME AS NEEDED FOR SLEEP.  ? ?Allergies  ?Allergen Reactions  ? Meloxicam Nausea Only  ? ?Recent Results (from the past 2160 hour(s))  ?CBC with Differential     Status: Abnormal  ? Collection Time: 08/15/21  1:45 PM  ?Result Value Ref Range  ? WBC 8.1 4.0 -  10.5 K/uL  ? RBC 4.76 3.87 - 5.11 MIL/uL  ? Hemoglobin 15.9 (H) 12.0 - 15.0 g/dL  ? HCT 46.5 (H) 36.0 - 46.0 %  ? MCV 97.7 80.0 - 100.0 fL  ? MCH 33.4 26.0 - 34.0 pg  ? MCHC 34.2 30.0 - 36.0 g/dL  ? RDW 13.1 11.5 - 15.5 %  ? Platelets 52 (L) 150 - 400 K/uL  ?  Comment: SPECIMEN CHECKED FOR CLOTS ?Immature Platelet Fraction may be ?clinically indicated, consider ?ordering this additional test ?[id number] ?  ? nRBC 0.0 0.0 - 0.2 %  ? Neutrophils Relative % 55 %  ? Neutro Abs 4.4 1.7 - 7.7 K/uL  ? Lymphocytes Relative 31 %  ? Lymphs Abs 2.5 0.7 - 4.0 K/uL  ? Monocytes Relative 9 %  ? Monocytes Absolute 0.8 0.1 - 1.0 K/uL  ? Eosinophils Relative 4 %  ? Eosinophils Absolute 0.3 0.0 - 0.5 K/uL  ? Basophils Relative 1 %  ? Basophils Absolute 0.1 0.0 - 0.1 K/uL  ? Immature Granulocytes 0 %  ? Abs Immature Granulocytes 0.03 0.00 - 0.07 K/uL  ?  Comment: Performed at [facility], [address], [place], [place] [address]  ?CBC with Differential     Status: Abnormal  ? Collection Time: 09/05/21  1:53 PM  ?Result Value Ref Range  ? WBC 9.3 4.0 - 10.5 K/uL  ? RBC 4.70 3.87 - 5.11 MIL/uL  ? Hemoglobin 15.6 (H) 12.0 - 15.0 g/dL  ? HCT 46.5 (H) 36.0 - 46.0 %  ? MCV 98.9 80.0 - 100.0 fL  ? MCH 33.2 26.0 - 34.0 pg  ? MCHC 33.5 30.0 - 36.0 g/dL  ? RDW 13.0 11.5 - 15.5 %  ? Platelets 41 (L) 150 - 400 K/uL  ?  Comment: Immature Platelet Fraction may be ?clinically indicated, consider ?ordering this additional test ?[id number] ?  ? nRBC 0.0 0.0 - 0.2 %  ? Neutrophils Relative % 59 %  ? Neutro Abs 5.6 1.7 - 7.7 K/uL  ? Lymphocytes Relative 27 %  ? Lymphs Abs 2.5 0.7 - 4.0 K/uL  ? Monocytes  Relative 10 %  ? Monocytes Absolute 0.9 0.1 - 1.0 K/uL  ? Eosinophils Relative 3 %  ? Eosinophils Absolute 0.3 0.0 - 0.5 K/uL  ? Basophils Relative 1 %  ? Basophils Absolute 0.1 0.0 - 0.1 K/uL  ? Immature Granu

## 2021-11-08 NOTE — Patient Instructions (Signed)
Aspercream with lidocaine or voltaren gel over the counter ? ?

## 2021-11-09 ENCOUNTER — Ambulatory Visit: Payer: BC Managed Care – PPO | Admitting: Pulmonary Disease

## 2021-11-20 ENCOUNTER — Other Ambulatory Visit: Payer: Self-pay | Admitting: Internal Medicine

## 2021-11-20 DIAGNOSIS — F419 Anxiety disorder, unspecified: Secondary | ICD-10-CM

## 2021-11-20 MED ORDER — ALPRAZOLAM 0.25 MG PO TABS: 0.2500 mg | ORAL_TABLET | Freq: Every day | ORAL | 2 refills | Status: DC | PRN

## 2021-11-23 ENCOUNTER — Telehealth: Payer: BC Managed Care – PPO | Admitting: Physician Assistant

## 2021-11-23 DIAGNOSIS — J302 Other seasonal allergic rhinitis: Secondary | ICD-10-CM | POA: Diagnosis not present

## 2021-11-23 DIAGNOSIS — R058 Other specified cough: Secondary | ICD-10-CM

## 2021-11-23 MED ORDER — BENZONATATE 100 MG PO CAPS: 100.0000 mg | ORAL_CAPSULE | Freq: Three times a day (TID) | ORAL | 0 refills | Status: DC | PRN

## 2021-11-23 MED ORDER — AZELASTINE HCL 0.05 % OP SOLN: 1.0000 [drp] | Freq: Two times a day (BID) | OPHTHALMIC | 0 refills | Status: DC

## 2021-11-23 MED ORDER — LEVOCETIRIZINE DIHYDROCHLORIDE 5 MG PO TABS: 5.0000 mg | ORAL_TABLET | Freq: Every evening | ORAL | 0 refills | Status: DC

## 2021-11-23 NOTE — Progress Notes (Signed)
E visit for Allergic Rhinitis ?We are sorry that you are not feeling well.  Here is how we plan to help! ? ?Based on what you have shared with me it looks like you have Allergic Rhinitis.  Rhinitis is when a reaction occurs that causes nasal congestion, runny nose, sneezing, and itching.  Most types of rhinitis are caused by an inflammation and are associated with symptoms in the eyes ears or throat. ?There are several types of rhinitis.  The most common are acute rhinitis, which is usually caused by a viral illness, allergic or seasonal rhinitis, and nonallergic or year-round rhinitis.  Nasal allergies occur certain times of the year.  Allergic rhinitis is caused when allergens in the air trigger the release of histamine in the body.  Histamine causes itching, swelling, and fluid to build up in the fragile linings of the nasal passages, sinuses and eyelids.  An itchy nose and clear discharge are common. ? ?I recommend the following: ?Stop the Claritin. I have sent in a script for Xyzal to take once daily.  ?Continue the Nasacort spray but do a saline nasal rinse before use.  ?I have sent in an allergy eye drop, Optivar, to use as directed. ?I have also sent in a cough medication, Tessalon to calm the symptom of cough while other medicines are building in your system.  ? ?HOME CARE: ? ?You can use an over-the-counter saline nasal spray as needed ?Avoid areas where there is heavy dust, mites, or molds ?Stay indoors on windy days during the pollen season ?Keep windows closed in home, at least in bedroom; use air conditioner. ?Use high-efficiency house air filter ?Keep windows closed in car, turn [facility] on re-circulate ?Avoid playing out with dog during pollen season ? ?GET HELP RIGHT AWAY IF: ? ?If your symptoms do not improve within 10 days ?You become short of breath ?You develop yellow or green discharge from your nose for over 3 days ?You have coughing fits ? ?MAKE SURE YOU: ? ?Understand these instructions ?Will  watch your condition ?Will get help right away if you are not doing well or get worse ? ?Thank you for choosing an e-visit. ?Your e-visit answers were reviewed by a board certified advanced clinical practitioner to complete your personal care plan. Depending upon the condition, your plan could have included both over the counter or prescription medications. ?Please review your pharmacy choice. Be sure that the pharmacy you have chosen is open so that you can pick up your prescription now.  If there is a problem you may message your provider in [place] to have the prescription routed to another pharmacy. ?Your safety is important to [place]. If you have drug allergies check your prescription carefully.  ?For the next 24 hours, you can use MyChart to ask questions about today?s visit, request a non-urgent call back, or ask for a work or school excuse from your e-visit provider. ?You will get an email in the next two days asking about your experience. I hope that your e-visit has been valuable and will speed your recovery. ? ? ? ? ? ? ? ?

## 2021-11-23 NOTE — Progress Notes (Signed)
I have spent 5 minutes in review of e-visit questionnaire, review and updating patient chart, medical decision making and response to patient.   Perri Aragones Cody Ariday Brinker, PA-C    

## 2021-11-24 ENCOUNTER — Encounter: Payer: Self-pay | Admitting: Adult Health

## 2021-11-24 ENCOUNTER — Ambulatory Visit (INDEPENDENT_AMBULATORY_CARE_PROVIDER_SITE_OTHER): Payer: BC Managed Care – PPO | Admitting: Adult Health

## 2021-11-24 DIAGNOSIS — F17210 Nicotine dependence, cigarettes, uncomplicated: Secondary | ICD-10-CM

## 2021-11-24 DIAGNOSIS — J309 Allergic rhinitis, unspecified: Secondary | ICD-10-CM | POA: Diagnosis not present

## 2021-11-24 DIAGNOSIS — J449 Chronic obstructive pulmonary disease, unspecified: Secondary | ICD-10-CM | POA: Diagnosis not present

## 2021-11-24 DIAGNOSIS — F17200 Nicotine dependence, unspecified, uncomplicated: Secondary | ICD-10-CM

## 2021-11-24 MED ORDER — BREZTRI AEROSPHERE 160-9-4.8 MCG/ACT IN AERO: 2.0000 | INHALATION_SPRAY | Freq: Two times a day (BID) | RESPIRATORY_TRACT | 0 refills | Status: DC

## 2021-11-24 NOTE — Progress Notes (Signed)
? ?'@Patient'$  ID: Carly [name], female    DOB: Dec 21, 1970, 51 y.o.   MRN: [id number] ? ?Chief Complaint  ?Patient presents with  ? Follow-up  ? ? ?Referring provider: ?McLean-Scocuzza, Olivia Mackie * ? ?HPI: ?51 year old female active smoker followed for COPD with asthma ?Medical history significant for fatty liver, chronic ITP ? ?TEST/EVENTS :  ?05/23/2021 PFTs: FEV1 1.60 L or 60% predicted, FVC 2.32 L or 69% predicted, FEV1/FVC 69%.  Lung volumes show hyperinflation and air trapping there is a small airways component with improvement postbronchodilator.  Diffusion capacity is mildly reduced. ? ?CT chest June 14, 2021 mild emphysema, 3 mm right middle lobe nodule, 2 mm right upper lobe nodule mild diffuse groundglass opacity and micronodularity likely related to respiratory bronchiolitis/respiratory bronchiolitis interstitial lung disease ? ?11/24/2021 Follow up : COPD with asthma. AR  ?Patient presents for 106-monthfollow-up.  Patient has underlying moderate COPD with asthma.  She continues to smoke.  We discussed smoking cessation.  She does have a Nicotrol inhaler that she uses. She is working on quitting smoking . Long discussion regarding quitting .  ?Patient remains on Breztri inhaler twice daily.  She says overall breathing is doing okay.  She denies any flare of cough or wheezing. Does have a daily cough that is minimally productive, no discolored mucus.  ?Complains on nasal congestion and drainage. Worse with increased pollen outside. Says zyrtec does not work.  ?She denies any hemoptysis or chest pain .  ?We discussed the lung cancer screening program.  She is open to referral. ?Influenza vaccine is up-to-date.  We discussed COVID vaccine -unable to take due to chronic ITP .  ?Works fBiochemist, clinical. Sedentary lifestyle . Does ADLS and house work. Drives and does her own shopping .  ? ? ? ? ? ?Allergies  ?Allergen Reactions  ? Meloxicam Nausea Only  ? ? ?Immunization History  ?Administered Date(s) Administered  ?  Influenza,inj,Quad PF,6+ Mos 08/23/2014  ? Influenza-Unspecified 04/16/2019, 04/26/2021  ? Tdap 08/23/2014  ? ? ?Past Medical History:  ?Diagnosis Date  ? Anxiety   ? C. difficile diarrhea   ? 07/04/21  ? Chicken pox   ? Depression   ? Elevated testosterone level in female   ? [place]ma of lung [place]   ? bronchitis  ? Frequent headaches   ? Hay fever   ? Hypertension   ? Idiopathic thrombocytopenic purpura (ITP) (HCC)   ? Positive ANA (antinuclear antibody)   ? ? ?Tobacco History: ?Social History  ? ?Tobacco Use  ?Smoking Status Every Day  ? Packs/day: 1.50  ? Years: 28.00  ? Pack years: 42.00  ? Types: Cigarettes  ?Smokeless Tobacco Never  ?Tobacco Comments  ? 0.75 PPD 11/24/2021  ? ?Ready to quit: No ?Counseling given: Yes ?Tobacco comments: 0.75 PPD 11/24/2021 ? ? ?Outpatient Medications Prior to Visit  ?Medication Sig Dispense Refill  ? acetaminophen (TYLENOL) 500 MG tablet Take 500 mg by mouth every 6 (six) hours as needed.    ? albuterol (PROVENTIL) (2.5 MG/3ML) 0.083% nebulizer solution Take 3 mLs (2.5 mg total) by nebulization every 6 (six) hours as needed for wheezing or shortness of breath. 150 mL 1  ? albuterol (VENTOLIN HFA) 108 (90 Base) MCG/ACT inhaler INHALE 2 PUFFS INTO THE LUNGS EVERY 6 HOURS AS NEEDED FOR WHEEZING OR SHORTNESS OF BREATH (COUGH) 6.7 each 3  ? ALPRAZolam (XANAX) 0.25 MG tablet Take 1 tablet (0.25 mg total) by mouth daily as needed for anxiety. 30 tablet 2  ? amLODipine (NORVASC)  10 MG tablet Take 1 tablet (10 mg total) by mouth daily. 90 tablet 3  ? azelastine (OPTIVAR) 0.05 % ophthalmic solution Place 1 drop into both eyes 2 (two) times daily. 6 mL 0  ? benzonatate (TESSALON) 100 MG capsule Take 1 capsule (100 mg total) by mouth 3 (three) times daily as needed for cough. 30 capsule 0  ? Budeson-Glycopyrrol-Formoterol (BREZTRI AEROSPHERE) 160-9-4.8 MCG/ACT AERO Inhale 2 puffs into the lungs in the morning and at bedtime. 10.7 g 5  ? carvedilol (COREG) 25 MG tablet Take 1 tablet (25 mg  total) by mouth 2 (two) times daily with a meal. 180 tablet 3  ? Cholecalciferol 1.25 MG (50000 UT) capsule Take 1 capsule (50,000 Units total) by mouth every 30 (thirty) days. 1x per month note this 13 capsule 0  ? cyanocobalamin (,VITAMIN B-12,) 1000 MCG/ML injection INJECT 1 ML (1,000 MCG TOTAL) INTO THE MUSCLE EVERY 30 DAYS. 1 mL 15  ? cyclobenzaprine (FLEXERIL) 10 MG tablet Take 1 tablet (10 mg total) by mouth 3 (three) times daily as needed for muscle spasms. Will cause drowsiness do not drive or operate machinery while using 15 tablet 0  ? diclofenac Sodium (VOLTAREN) 1 % GEL Apply 2-4 g topically 4 (four) times daily. 2 grams upper body qid prn and 4 gram lower body qid prn 150 g 11  ? dicyclomine (BENTYL) 10 MG capsule TAKE 1 CAPSULE (10 MG TOTAL) BY MOUTH 3 (THREE) TIMES DAILY AS NEEDED FOR SPASMS. BEFORE FOOD 90 capsule 11  ? escitalopram (LEXAPRO) 20 MG tablet TAKE 1 TABLET BY MOUTH EVERY DAY 90 tablet 3  ? fluticasone (FLONASE) 50 MCG/ACT nasal spray Place 1 spray into both nostrils daily. 18.2 mL 2  ? guaiFENesin (MUCINEX) 600 MG 12 hr tablet Take 1 tablet (600 mg total) by mouth 2 (two) times daily. 60 tablet 0  ? levocetirizine (XYZAL) 5 MG tablet Take 1 tablet (5 mg total) by mouth every evening. 30 tablet 0  ? lidocaine (XYLOCAINE) 2 % solution 83m swish and swallow every 4 hours as needed for sore throat 100 mL 0  ? nicotine (NICOTROL) 10 MG inhaler Inhale 1 Cartridge (1 continuous puffing total) into the lungs as needed for smoking cessation (No more than 16 cartridges per day). 36 each 1  ? omeprazole (PRILOSEC) 20 MG capsule TAKE 1 CAPSULE BY MOUTH EVERY DAY 90 capsule 3  ? ondansetron (ZOFRAN) 4 MG tablet TAKE 1 TABLET BY MOUTH EVERY 8 HOURS AS NEEDED FOR NAUSEA AND VOMITING 10 tablet 11  ? Plecanatide (TRULANCE) 3 MG TABS Take 3 mg by mouth daily. 30 tablet 0  ? spironolactone (ALDACTONE) 25 MG tablet Take 1 tablet (25 mg total) by mouth daily. In am 90 tablet 3  ? Syringe/Needle, Disp, 25G X  1-1/2" 1 ML MISC 1 Device by Does not apply route as directed. 30 each 11  ? traMADol (ULTRAM) 50 MG tablet Take 1 tablet (50 mg total) by mouth every 12 (twelve) hours as needed. 60 tablet 0  ? traZODone (DESYREL) 50 MG tablet TAKE 0.5-1 TABLETS BY MOUTH AT BEDTIME AS NEEDED FOR SLEEP. 90 tablet 3  ? predniSONE (DELTASONE) 10 MG tablet 4 tabs for 2 days, then 3 tabs for 2 days, 2 tabs for 2 days, and 1 tab for 2 days. 20 tablet 0  ? promethazine-dextromethorphan (PROMETHAZINE-DM) 6.25-15 MG/5ML syrup Take 2.5 mLs by mouth 4 (four) times daily as needed for cough. (Patient not taking: Reported on 11/24/2021) 118 mL 0  ? ?  No facility-administered medications prior to visit.  ? ? ? ?Review of Systems:  ? ?Constitutional:   No  weight loss, night sweats,  Fevers, chills, ?+ fatigue, or  lassitude. ? ?HEENT:   No headaches,  Difficulty swallowing,  Tooth/dental problems, or  Sore throat,  ?              No sneezing, itching, ear ache, ?+ nasal congestion, post nasal drip,  ? ?CV:  No chest pain,  Orthopnea, PND, swelling in lower extremities, anasarca, dizziness, palpitations, syncope.  ? ?GI  No heartburn, indigestion, abdominal pain, nausea, vomiting, diarrhea, change in bowel habits, loss of appetite, bloody stools.  ? ?Resp:   No chest wall deformity ? ?Skin: no rash or lesions. ? ?GU: no dysuria, change in color of urine, no urgency or frequency.  No flank pain, no hematuria  ? ?MS:  No joint pain or swelling.  No decreased range of motion.  No back pain. ? ? ? ?Physical Exam ? ?BP 124/76 (BP Location: Left Arm, Cuff Size: Large)   Pulse 80   Temp 97.7 ?F (36.5 ?C) (Temporal)   Ht '5\' 2"'$  (1.575 m)   Wt 200 lb (90.7 kg)   SpO2 95%   BMI 36.58 kg/m?  ? ?GEN: A/Ox3; pleasant , NAD, well nourished  ?  ?HEENT:  [place]/AT, NOSE-clear drainage  THROAT-clear, no lesions, no postnasal drip or exudate noted.  ? ?NECK:  Supple w/ fair ROM; no JVD; normal carotid impulses w/o bruits; no thyromegaly or nodules palpated; no  lymphadenopathy.   ? ?RESP  few trace rhonchi  no accessory muscle use, no dullness to percussion ? ?CARD:  RRR, no m/r/g, no peripheral edema, pulses intact, no cyanosis or clubbing. ? ?GI:   Soft & nt; nml bowel

## 2021-11-24 NOTE — Patient Instructions (Addendum)
-  Continue Breztri 2 puffs Twice daily, brush tongue and rinse mouth afterwards ?-Continue Albuterol inhaler 2 puffs every 6 hours as needed for shortness of breath or wheezing. Notify if symptoms persist despite rescue inhaler/neb use. ?Start Allegra '180mg'$  daily  ?Start saline nasal spray Twice daily .  ?Add chlorpheniramine 4 mg At bedtime  As needed  drainage . (Chlor tabs )  ?Flonase 2 puffs daily .  ?-Continue omeprazole 20 mg daily  ?-Activity as tolerated ?-Work on quitting smoking ?-Check to see if pneumonia vaccine is okay to take .  ?Refer to lung cancer screening program.  ?-Follow-up with Dr. Patsey Berthold in 3-4 months and As needed   ?-Please contact office for sooner follow up if symptoms do not improve or worsen or seek emergency care  ? ?

## 2021-11-24 NOTE — Addendum Note (Signed)
Addended by: Claudette Head A on: 11/24/2021 12:20 PM ? ? Modules accepted: Orders ? ?

## 2021-11-24 NOTE — Assessment & Plan Note (Signed)
Mild flare.  Add in [place].  May try chlor tabs 4 mg 1 at bedtime as needed.  Advised of sedating effects.  Continue on Flonase.  Add in saline nasal rinses. ?Smoking cessation ? ?Plan  ?Patient Instructions  ?-Continue Breztri 2 puffs Twice daily, brush tongue and rinse mouth afterwards ?-Continue Albuterol inhaler 2 puffs every 6 hours as needed for shortness of breath or wheezing. Notify if symptoms persist despite rescue inhaler/neb use. ?Start Allegra '180mg'$  daily  ?Start saline nasal spray Twice daily .  ?Add chlorpheniramine 4 mg At bedtime  As needed  drainage . (Chlor tabs )  ?Flonase 2 puffs daily .  ?-Continue omeprazole 20 mg daily  ?-Activity as tolerated ?-Work on quitting smoking ?-Check to see if pneumonia vaccine is okay to take .  ?Refer to lung cancer screening program.  ?-Follow-up with Dr. Patsey Berthold in 3-4 months and As needed   ?-Please contact office for sooner follow up if symptoms do not improve or worsen or seek emergency care  ? ?  ? ?

## 2021-11-24 NOTE — Assessment & Plan Note (Signed)
Moderate COPD with asthma- CT chest showed some respiratory bronchiolitis November 2022.  We discussed extensively smoking cessation.  Referral to the lung cancer screening program will continue on maintenance regimen with triple therapy. ?Influenza vaccine is up-to-date.  She is unable to take the COVID vaccine due to her chronic ITP.  Recommend pneumonia vaccine pain.  Patient says she will check with her hematologist to make sure this is okay. ?Activity as tolerated. ? ?Plan  ?Patient Instructions  ?-Continue Breztri 2 puffs Twice daily, brush tongue and rinse mouth afterwards ?-Continue Albuterol inhaler 2 puffs every 6 hours as needed for shortness of breath or wheezing. Notify if symptoms persist despite rescue inhaler/neb use. ?Start Allegra '180mg'$  daily  ?Start saline nasal spray Twice daily .  ?Add chlorpheniramine 4 mg At bedtime  As needed  drainage . (Chlor tabs )  ?Flonase 2 puffs daily .  ?-Continue omeprazole 20 mg daily  ?-Activity as tolerated ?-Work on quitting smoking ?-Check to see if pneumonia vaccine is okay to take .  ?Refer to lung cancer screening program.  ?-Follow-up with Dr. Patsey Berthold in 3-4 months and As needed   ?-Please contact office for sooner follow up if symptoms do not improve or worsen or seek emergency care  ? ?  ? ?

## 2021-11-24 NOTE — Assessment & Plan Note (Signed)
Smoking cessation  

## 2021-11-26 NOTE — Progress Notes (Signed)
[place]  [phone] [phone]) [phone]  ID: Carly [name] OB: 09/16/1970  MR#: [id number]  KTG#:[id number]  Patient Care Team: McLean-Scocuzza, Nino Glow, MD as PCP - General (Internal Medicine) Lloyd Huger, MD as Consulting Physician (Oncology)   CHIEF COMPLAINT: ITP.  INTERVAL HISTORY: Patient returns to clinic today for repeat laboratory work, further evaluation, and continuation of Nplate.  She continues to feel well and remains asymptomatic. She denies any easy bleeding or bruising. She has no neurologic complaints.  She denies any recent fevers or illnesses.  She has a good appetite and denies weight loss.  She denies any chest pain, shortness of breath, cough, or hemoptysis. She denies any nausea, vomiting, constipation, or diarrhea.  She has no urinary complaints.  Patient offers no specific complaints today.  REVIEW OF SYSTEMS:   Review of Systems  Constitutional: Negative.  Negative for fever, malaise/fatigue and weight loss.  Respiratory: Negative.  Negative for cough, hemoptysis and shortness of breath.   Cardiovascular: Negative.  Negative for chest pain and leg swelling.  Gastrointestinal: Negative.  Negative for abdominal pain, blood in stool and melena.  Genitourinary: Negative.  Negative for hematuria.  Musculoskeletal: Negative.  Negative for back pain.  Skin: Negative.  Negative for rash.  Neurological: Negative.  Negative for dizziness, focal weakness, weakness and headaches.  Endo/Heme/Allergies: Negative.  Does not bruise/bleed easily.  Psychiatric/Behavioral: Negative.  The patient is not nervous/anxious.    As per HPI. Otherwise, a complete review of systems is negative.  PAST MEDICAL HISTORY: Past Medical History:  Diagnosis Date   Anxiety    C. difficile diarrhea    07/04/21   Chicken pox    Depression    Elevated testosterone level in female    Emphysema of lung (HCC)    bronchitis   Frequent headaches     Hay fever    Hypertension    Idiopathic thrombocytopenic purpura (ITP) (HCC)    Positive ANA (antinuclear antibody)     PAST SURGICAL HISTORY: Past Surgical History:  Procedure Laterality Date   HEMATOMA EVACUATION N/A 04/07/2020   Procedure: EVACUATION HEMATOMA  AND APPLICATION OF PRESSURE DRESSING;  Surgeon: Benjaman Kindler, MD;  Location: [facility];  Service: Gynecology;  Laterality: N/A;   HYSTEROSCOPY WITH NOVASURE N/A 02/21/2018   Procedure: HYSTEROSCOPY WITH NOVASURE, POLYPECTOMY;  Surgeon: Benjaman Kindler, MD;  Location: [facility];  Service: Gynecology;  Laterality: N/A;   TUBAL LIGATION  1997   TUBAL LIGATION      FAMILY HISTORY: Family History  Problem Relation Age of Onset   Hyperlipidemia Mother    Heart disease Mother    Stroke Mother    Depression Mother    Mental illness Mother    Heart attack Mother 44   Hyperlipidemia Father    Depression Father    Mental illness Father    Diabetes Paternal Grandfather    Cancer Cousin     ADVANCED DIRECTIVES (Y/N):  N  HEALTH MAINTENANCE: Social History   Tobacco Use   Smoking status: Every Day    Packs/day: 1.50    Years: 28.00    Pack years: 42.00    Types: Cigarettes   Smokeless tobacco: Never   Tobacco comments:    0.75 PPD 11/24/2021  Vaping Use   Vaping Use: Never used  Substance Use Topics   Alcohol use: Yes    Alcohol/week: 8.0 standard drinks    Types: 8 Standard drinks or equivalent per week    Comment: Beer (Can)  Drug use: No    Comment: CBD oil     Colonoscopy:  PAP:  Bone density:  Lipid panel:  Allergies  Allergen Reactions   Meloxicam Nausea Only    Current Outpatient Medications  Medication Sig Dispense Refill   acetaminophen (TYLENOL) 500 MG tablet Take 500 mg by mouth every 6 (six) hours as needed.     albuterol (PROVENTIL) (2.5 MG/3ML) 0.083% nebulizer solution Take 3 mLs (2.5 mg total) by nebulization every 6 (six) hours as needed for wheezing or shortness of breath. 150 mL 1    albuterol (VENTOLIN HFA) 108 (90 Base) MCG/ACT inhaler INHALE 2 PUFFS INTO THE LUNGS EVERY 6 HOURS AS NEEDED FOR WHEEZING OR SHORTNESS OF BREATH (COUGH) 6.7 each 3   ALPRAZolam (XANAX) 0.25 MG tablet Take 1 tablet (0.25 mg total) by mouth daily as needed for anxiety. 30 tablet 2   amLODipine (NORVASC) 10 MG tablet Take 1 tablet (10 mg total) by mouth daily. 90 tablet 3   azelastine (OPTIVAR) 0.05 % ophthalmic solution Place 1 drop into both eyes 2 (two) times daily. 6 mL 0   benzonatate (TESSALON) 100 MG capsule Take 1 capsule (100 mg total) by mouth 3 (three) times daily as needed for cough. 30 capsule 0   Budeson-Glycopyrrol-Formoterol (BREZTRI AEROSPHERE) 160-9-4.8 MCG/ACT AERO Inhale 2 puffs into the lungs in the morning and at bedtime. 5.9 g 0   carvedilol (COREG) 25 MG tablet Take 1 tablet (25 mg total) by mouth 2 (two) times daily with a meal. 180 tablet 3   Cholecalciferol 1.25 MG (50000 UT) capsule Take 1 capsule (50,000 Units total) by mouth every 30 (thirty) days. 1x per month note this 13 capsule 0   cyanocobalamin (,VITAMIN B-12,) 1000 MCG/ML injection INJECT 1 ML (1,000 MCG TOTAL) INTO THE MUSCLE EVERY 30 DAYS. 1 mL 15   cyclobenzaprine (FLEXERIL) 10 MG tablet Take 1 tablet (10 mg total) by mouth 3 (three) times daily as needed for muscle spasms. Will cause drowsiness do not drive or operate machinery while using 15 tablet 0   diclofenac Sodium (VOLTAREN) 1 % GEL Apply 2-4 g topically 4 (four) times daily. 2 grams upper body qid prn and 4 gram lower body qid prn 150 g 11   dicyclomine (BENTYL) 10 MG capsule TAKE 1 CAPSULE (10 MG TOTAL) BY MOUTH 3 (THREE) TIMES DAILY AS NEEDED FOR SPASMS. BEFORE FOOD 90 capsule 11   escitalopram (LEXAPRO) 20 MG tablet TAKE 1 TABLET BY MOUTH EVERY DAY 90 tablet 3   fluticasone (FLONASE) 50 MCG/ACT nasal spray Place 1 spray into both nostrils daily. 18.2 mL 2   levocetirizine (XYZAL) 5 MG tablet Take 1 tablet (5 mg total) by mouth every evening. 30 tablet  0   nicotine (NICOTROL) 10 MG inhaler Inhale 1 Cartridge (1 continuous puffing total) into the lungs as needed for smoking cessation (No more than 16 cartridges per day). 36 each 1   omeprazole (PRILOSEC) 20 MG capsule TAKE 1 CAPSULE BY MOUTH EVERY DAY 90 capsule 3   ondansetron (ZOFRAN) 4 MG tablet TAKE 1 TABLET BY MOUTH EVERY 8 HOURS AS NEEDED FOR NAUSEA AND VOMITING 10 tablet 11   spironolactone (ALDACTONE) 25 MG tablet Take 1 tablet (25 mg total) by mouth daily. In am 90 tablet 3   Syringe/Needle, Disp, 25G X 1-1/2" 1 ML MISC 1 Device by Does not apply route as directed. 30 each 11   traMADol (ULTRAM) 50 MG tablet Take 1 tablet (50 mg total) by mouth  every 12 (twelve) hours as needed. 60 tablet 0   traZODone (DESYREL) 50 MG tablet TAKE 0.5-1 TABLETS BY MOUTH AT BEDTIME AS NEEDED FOR SLEEP. 90 tablet 3   No current facility-administered medications for this visit.    OBJECTIVE: Vitals:   11/28/21 1351 11/28/21 1352  BP:  (!) 144/89  Pulse:  74  Resp: 18   Temp: (!) 97.4 F (36.3 C)      Body mass index is 36.8 kg/m.    ECOG FS:0 - Asymptomatic  General: Well-developed, well-nourished, no acute distress. Eyes: Pink conjunctiva, anicteric sclera. HEENT: Normocephalic, moist mucous membranes. Lungs: No audible wheezing or coughing. Heart: Regular rate and rhythm. Abdomen: Soft, nontender, no obvious distention. Musculoskeletal: No edema, cyanosis, or clubbing. Neuro: Alert, answering all questions appropriately. Cranial nerves grossly intact. Skin: No rashes or petechiae noted. Psych: Normal affect.  LAB RESULTS:  Lab Results  Component Value Date   NA 139 05/04/2021   K 3.8 05/04/2021   CL 101 05/04/2021   CO2 22 05/04/2021   GLUCOSE 75 05/04/2021   BUN 8 05/04/2021   CREATININE 0.61 05/04/2021   CALCIUM 9.1 05/04/2021   PROT 7.0 05/04/2021   ALBUMIN 4.5 05/04/2021   AST 41 (H) 05/04/2021   ALT 30 05/04/2021   ALKPHOS 96 05/04/2021   BILITOT 0.6 05/04/2021    GFRNONAA >60 04/08/2021   GFRAA >60 04/08/2020    Lab Results  Component Value Date   WBC 10.5 11/28/2021   NEUTROABS 6.5 11/28/2021   HGB 15.1 (H) 11/28/2021   HCT 44.8 11/28/2021   MCV 98.0 11/28/2021   PLT 41 (L) 11/28/2021     STUDIES: US Venous Img Lower Unilateral Left  Result Date: 11/08/2021 CLINICAL DATA:  left posterior knee pain r/o bakers cyst vs other EXAM: LEFT LOWER EXTREMITY VENOUS DOPPLER ULTRASOUND TECHNIQUE: Gray-scale sonography with compression, as well as color and duplex ultrasound, were performed to evaluate the deep venous system(s) from the level of the common femoral vein through the popliteal and proximal calf veins. COMPARISON:  None. FINDINGS: VENOUS Normal compressibility of the common femoral, superficial femoral, and popliteal veins, as well as the visualized calf veins. Visualized portions of profunda femoral vein and great saphenous vein unremarkable. No filling defects to suggest DVT on grayscale or color Doppler imaging. Doppler waveforms show normal direction of venous flow, normal respiratory plasticity and response to augmentation. Limited views of the contralateral common femoral vein are unremarkable. IMPRESSION: No evidence of DVT in the left lower extremity. Electronically Signed   By: Margaretha Sheffield M.D.   On: 11/08/2021 15:09   DG Knee Complete 4 Views Left  Result Date: 11/09/2021 CLINICAL DATA:  Bilateral knee pain. EXAM: LEFT KNEE - COMPLETE 4+ VIEW COMPARISON:  None. FINDINGS: No evidence of an acute fracture or dislocation. Mild medial and lateral chondrocalcinosis is noted. A small joint effusion is noted. IMPRESSION: 1. No evidence of an acute osseous abnormality. 2. Mild chondrocalcinosis. 3. Small joint effusion. Electronically Signed   By: Virgina Norfolk M.D.   On: 11/09/2021 15:51   DG Knee Complete 4 Views Right  Result Date: 11/09/2021 CLINICAL DATA:  Bilateral knee pain. EXAM: RIGHT KNEE - COMPLETE 4+ VIEW COMPARISON:  None.  FINDINGS: No evidence of an acute fracture or dislocation. Mild to moderate severity medial and lateral chondrocalcinosis is noted. A very small joint effusion is seen. IMPRESSION: 1. No acute osseous abnormality. 2. Chondrocalcinosis. 3. Very small joint effusion. Electronically Signed   By: Virgina Norfolk  M.D.   On: 11/09/2021 15:53    ASSESSMENT: ITP  PLAN:    1.  ITP: Confirmed with normal bone marrow biopsy and normal FISH and cytogenetics on July 22, 2019.  Patient had a positive ANA which is likely clinically insignificant.  The remainder of her laboratory work was either negative or within normal limits. CT scan on March 04, 2017 did not reveal splenomegaly.  Previously, patient received 1 week of prednisone at 1 mg/kg dose and had no appreciable change in her platelet count.  Rituxan was denied by insurance.  Previously, patient received 3 mcg/kg of Nplate with her platelet count increasing from 33-287.  Patient's platelet count is essentially stable at 41 today.  We will once again increase her dose to 5 mcg/kg in order to hopefully achieve less MD visits and injections.  Patient will now come back to the clinic in 6 weeks for repeat laboratory work and additional treatment.  Return to clinic in 12 weeks for laboratory work, further evaluation, and continuation of treatment.   2.  History of positive ANA: Likely clinically insignificant.  Referral was previously sent to rheumatology.  I spent a total of 30 minutes reviewing chart data, face-to-face evaluation with the patient, counseling and coordination of care as detailed above.    Patient expressed understanding and was in agreement with this plan. She also understands that She can call clinic at any time with any questions, concerns, or complaints.    Lloyd Huger, MD   11/30/2021 10:26 AM

## 2021-11-28 ENCOUNTER — Inpatient Hospital Stay: Payer: BC Managed Care – PPO | Attending: Oncology

## 2021-11-28 ENCOUNTER — Inpatient Hospital Stay (HOSPITAL_BASED_OUTPATIENT_CLINIC_OR_DEPARTMENT_OTHER): Payer: BC Managed Care – PPO | Admitting: Oncology

## 2021-11-28 ENCOUNTER — Inpatient Hospital Stay: Payer: BC Managed Care – PPO

## 2021-11-28 VITALS — BP 144/89 | HR 74 | Temp 97.4°F | Resp 18 | Wt 201.2 lb

## 2021-11-28 DIAGNOSIS — D693 Immune thrombocytopenic purpura: Secondary | ICD-10-CM | POA: Diagnosis present

## 2021-11-28 DIAGNOSIS — F17210 Nicotine dependence, cigarettes, uncomplicated: Secondary | ICD-10-CM | POA: Diagnosis not present

## 2021-11-28 LAB — CBC WITH DIFFERENTIAL/PLATELET
Abs Immature Granulocytes: 0 10*3/uL (ref 0.00–0.07)
Band Neutrophils: 0 %
Basophils Absolute: 0.1 10*3/uL (ref 0.0–0.1)
Basophils Relative: 1 %
Blasts: 0 %
Eosinophils Absolute: 0.3 10*3/uL (ref 0.0–0.5)
Eosinophils Relative: 3 %
HCT: 44.8 % (ref 36.0–46.0)
Hemoglobin: 15.1 g/dL — ABNORMAL HIGH (ref 12.0–15.0)
Lymphocytes Relative: 27 %
Lymphs Abs: 2.8 10*3/uL (ref 0.7–4.0)
MCH: 33 pg (ref 26.0–34.0)
MCHC: 33.7 g/dL (ref 30.0–36.0)
MCV: 98 fL (ref 80.0–100.0)
Metamyelocytes Relative: 0 %
Monocytes Absolute: 0.8 10*3/uL (ref 0.1–1.0)
Monocytes Relative: 8 %
Myelocytes: 0 %
Neutro Abs: 6.5 10*3/uL (ref 1.7–7.7)
Neutrophils Relative %: 61 %
Other: 0 %
Platelets: 41 10*3/uL — ABNORMAL LOW (ref 150–400)
Promyelocytes Relative: 0 %
RBC: 4.57 MIL/uL (ref 3.87–5.11)
RDW: 12.7 % (ref 11.5–15.5)
WBC: 10.5 10*3/uL (ref 4.0–10.5)
nRBC: 0 % (ref 0.0–0.2)
nRBC: 0 /100 WBC

## 2021-11-28 MED ORDER — ROMIPLOSTIM INJECTION 500 MCG: 5.0000 ug/kg | Freq: Once | SUBCUTANEOUS | Status: DC

## 2021-11-28 NOTE — Progress Notes (Signed)
Patient denies any concerns today.  

## 2021-11-30 ENCOUNTER — Inpatient Hospital Stay: Payer: BC Managed Care – PPO

## 2021-11-30 ENCOUNTER — Encounter: Payer: Self-pay | Admitting: Oncology

## 2021-11-30 DIAGNOSIS — D693 Immune thrombocytopenic purpura: Secondary | ICD-10-CM

## 2021-11-30 MED ORDER — ROMIPLOSTIM INJECTION 500 MCG
5.0000 ug/kg | Freq: Once | SUBCUTANEOUS | Status: AC
  Administered 2021-11-30: 455 ug via SUBCUTANEOUS
  Filled 2021-11-30: qty 0.91

## 2021-12-12 ENCOUNTER — Ambulatory Visit (INDEPENDENT_AMBULATORY_CARE_PROVIDER_SITE_OTHER): Payer: BC Managed Care – PPO | Admitting: Podiatry

## 2021-12-12 ENCOUNTER — Encounter: Payer: Self-pay | Admitting: Podiatry

## 2021-12-12 DIAGNOSIS — Q666 Other congenital valgus deformities of feet: Secondary | ICD-10-CM

## 2021-12-12 DIAGNOSIS — M722 Plantar fascial fibromatosis: Secondary | ICD-10-CM

## 2021-12-12 NOTE — Progress Notes (Signed)
Subjective:  Patient ID: Carly [name], female    DOB: [birth year],  MRN: [id number]  Chief Complaint  Patient presents with   Plantar Fasciitis    51 y.o. female presents with the above complaint.  Patient presents for follow-up bilateral Planter fasciitis right greater than left side.  She states the first injection helped considerably however she was not able to follow-up due to family death.  She states that she is now here to follow-up her pain has come back with a vengeance.  She wanted to get it evaluated.  She denies any other acute issues.   Review of Systems: Negative except as noted in the HPI. Denies N/V/F/Ch.  Past Medical History:  Diagnosis Date   Anxiety    C. difficile diarrhea    07/04/21   Chicken pox    Depression    Elevated testosterone level in female    Emphysema of lung (HCC)    bronchitis   Frequent headaches    Hay fever    Hypertension    Idiopathic thrombocytopenic purpura (ITP) (HCC)    Positive ANA (antinuclear antibody)     Current Outpatient Medications:    acetaminophen (TYLENOL) 500 MG tablet, Take 500 mg by mouth every 6 (six) hours as needed., Disp: , Rfl:    albuterol (PROVENTIL) (2.5 MG/3ML) 0.083% nebulizer solution, Take 3 mLs (2.5 mg total) by nebulization every 6 (six) hours as needed for wheezing or shortness of breath., Disp: 150 mL, Rfl: 1   albuterol (VENTOLIN HFA) 108 (90 Base) MCG/ACT inhaler, INHALE 2 PUFFS INTO THE LUNGS EVERY 6 HOURS AS NEEDED FOR WHEEZING OR SHORTNESS OF BREATH (COUGH), Disp: 6.7 each, Rfl: 3   ALPRAZolam (XANAX) 0.25 MG tablet, Take 1 tablet (0.25 mg total) by mouth daily as needed for anxiety., Disp: 30 tablet, Rfl: 2   amLODipine (NORVASC) 10 MG tablet, Take 1 tablet (10 mg total) by mouth daily., Disp: 90 tablet, Rfl: 3   azelastine (OPTIVAR) 0.05 % ophthalmic solution, Place 1 drop into both eyes 2 (two) times daily., Disp: 6 mL, Rfl: 0   benzonatate (TESSALON) 100 MG capsule, Take 1 capsule (100 mg total)  by mouth 3 (three) times daily as needed for cough., Disp: 30 capsule, Rfl: 0   Budeson-Glycopyrrol-Formoterol (BREZTRI AEROSPHERE) 160-9-4.8 MCG/ACT AERO, Inhale 2 puffs into the lungs in the morning and at bedtime., Disp: 5.9 g, Rfl: 0   carvedilol (COREG) 25 MG tablet, Take 1 tablet (25 mg total) by mouth 2 (two) times daily with a meal., Disp: 180 tablet, Rfl: 3   Cholecalciferol 1.25 MG (50000 UT) capsule, Take 1 capsule (50,000 Units total) by mouth every 30 (thirty) days. 1x per month note this, Disp: 13 capsule, Rfl: 0   cyanocobalamin (,VITAMIN B-12,) 1000 MCG/ML injection, INJECT 1 ML (1,000 MCG TOTAL) INTO THE MUSCLE EVERY 30 DAYS., Disp: 1 mL, Rfl: 15   cyclobenzaprine (FLEXERIL) 10 MG tablet, Take 1 tablet (10 mg total) by mouth 3 (three) times daily as needed for muscle spasms. Will cause drowsiness do not drive or operate machinery while using, Disp: 15 tablet, Rfl: 0   diclofenac Sodium (VOLTAREN) 1 % GEL, Apply 2-4 g topically 4 (four) times daily. 2 grams upper body qid prn and 4 gram lower body qid prn, Disp: 150 g, Rfl: 11   dicyclomine (BENTYL) 10 MG capsule, TAKE 1 CAPSULE (10 MG TOTAL) BY MOUTH 3 (THREE) TIMES DAILY AS NEEDED FOR SPASMS. BEFORE FOOD, Disp: 90 capsule, Rfl: 11   escitalopram (  LEXAPRO) 20 MG tablet, TAKE 1 TABLET BY MOUTH EVERY DAY, Disp: 90 tablet, Rfl: 3   fluticasone (FLONASE) 50 MCG/ACT nasal spray, Place 1 spray into both nostrils daily., Disp: 18.2 mL, Rfl: 2   levocetirizine (XYZAL) 5 MG tablet, Take 1 tablet (5 mg total) by mouth every evening., Disp: 30 tablet, Rfl: 0   nicotine (NICOTROL) 10 MG inhaler, Inhale 1 Cartridge (1 continuous puffing total) into the lungs as needed for smoking cessation (No more than 16 cartridges per day)., Disp: 36 each, Rfl: 1   omeprazole (PRILOSEC) 20 MG capsule, TAKE 1 CAPSULE BY MOUTH EVERY DAY, Disp: 90 capsule, Rfl: 3   ondansetron (ZOFRAN) 4 MG tablet, TAKE 1 TABLET BY MOUTH EVERY 8 HOURS AS NEEDED FOR NAUSEA AND  VOMITING, Disp: 10 tablet, Rfl: 11   spironolactone (ALDACTONE) 25 MG tablet, Take 1 tablet (25 mg total) by mouth daily. In am, Disp: 90 tablet, Rfl: 3   Syringe/Needle, Disp, 25G X 1-1/2" 1 ML MISC, 1 Device by Does not apply route as directed., Disp: 30 each, Rfl: 11   traMADol (ULTRAM) 50 MG tablet, Take 1 tablet (50 mg total) by mouth every 12 (twelve) hours as needed., Disp: 60 tablet, Rfl: 0   traZODone (DESYREL) 50 MG tablet, TAKE 0.5-1 TABLETS BY MOUTH AT BEDTIME AS NEEDED FOR SLEEP., Disp: 90 tablet, Rfl: 3  Social History   Tobacco Use  Smoking Status Every Day   Packs/day: 1.50   Years: 28.00   Pack years: 42.00   Types: Cigarettes  Smokeless Tobacco Never  Tobacco Comments   0.75 PPD 11/24/2021    Allergies  Allergen Reactions   Meloxicam Nausea Only   Objective:  There were no vitals filed for this visit. There is no height or weight on file to calculate BMI. Constitutional Well developed. Well nourished.  Vascular Dorsalis pedis pulses palpable bilaterally. Posterior tibial pulses palpable bilaterally. Capillary refill normal to all digits.  No cyanosis or clubbing noted. Pedal hair growth normal.  Neurologic Normal speech. Oriented to person, place, and time. Epicritic sensation to light touch grossly present bilaterally.  Dermatologic Nails well groomed and normal in appearance. No open wounds. No skin lesions.  Orthopedic: Normal joint ROM without pain or crepitus bilaterally. No visible deformities. Tender to palpation at the calcaneal tuber bilaterally. No pain with calcaneal squeeze bilaterally. Ankle ROM diminished range of motion bilaterally. Silfverskiold Test: positive bilaterally.   Radiographs: Taken and reviewed. No acute fractures or dislocations. No evidence of stress fracture.  Plantar heel spur present. Posterior heel spur absent.   Assessment:   1. Plantar fasciitis of right foot   2. Plantar fasciitis of left foot   3. Pes  planovalgus     Plan:  Patient was evaluated and treated and all questions answered.  Plantar Fasciitis, bilaterally - XR reviewed as above.  - Educated on icing and stretching. Instructions given.  - injection delivered to the plantar fascia as below. - DME: Plantar fascial brace dispensed to support the medial longitudinal arch of the foot and offload pressure from the heel and prevent arch collapse during weightbearing - Pharmacologic management: None  Pes planovalgus -I explained to patient the etiology of pes planovalgus and relationship with Planter fasciitis and various treatment options were discussed.  Given patient foot structure in the setting of Planter fasciitis I believe patient will benefit from custom-made orthotics to help control the hindfoot motion support the arch of the foot and take the stress away from plantar fascial.  Patient agrees with the plan like to proceed with orthotics -Patient was casted for orthotics   Procedure: Injection Tendon/Ligament Location: Bilateral plantar fascia at the glabrous junction; medial approach. Skin Prep: alcohol Injectate: 0.5 cc 0.5% marcaine plain, 0.5 cc of 1% Lidocaine, 0.5 cc kenalog 10. Disposition: Patient tolerated procedure well. Injection site dressed with a band-aid.  No follow-ups on file.

## 2021-12-18 ENCOUNTER — Other Ambulatory Visit: Payer: Self-pay | Admitting: Pulmonary Disease

## 2021-12-20 NOTE — Progress Notes (Signed)
Agree with the details of the visit as noted by Tammy Parrett, NP.  C. Laura Ingra Rother, MD [facility] 

## 2021-12-21 ENCOUNTER — Other Ambulatory Visit: Payer: Self-pay | Admitting: Nurse Practitioner

## 2021-12-21 DIAGNOSIS — J069 Acute upper respiratory infection, unspecified: Secondary | ICD-10-CM

## 2022-01-09 ENCOUNTER — Inpatient Hospital Stay: Payer: BC Managed Care – PPO | Attending: Oncology

## 2022-01-09 ENCOUNTER — Inpatient Hospital Stay: Payer: BC Managed Care – PPO

## 2022-01-09 ENCOUNTER — Ambulatory Visit (INDEPENDENT_AMBULATORY_CARE_PROVIDER_SITE_OTHER): Payer: BC Managed Care – PPO | Admitting: Podiatry

## 2022-01-09 VITALS — Wt 201.0 lb

## 2022-01-09 DIAGNOSIS — D693 Immune thrombocytopenic purpura: Secondary | ICD-10-CM | POA: Diagnosis present

## 2022-01-09 DIAGNOSIS — M722 Plantar fascial fibromatosis: Secondary | ICD-10-CM | POA: Diagnosis not present

## 2022-01-09 DIAGNOSIS — Q666 Other congenital valgus deformities of feet: Secondary | ICD-10-CM | POA: Diagnosis not present

## 2022-01-09 LAB — CBC WITH DIFFERENTIAL/PLATELET
Abs Immature Granulocytes: 0.02 10*3/uL (ref 0.00–0.07)
Basophils Absolute: 0 10*3/uL (ref 0.0–0.1)
Basophils Relative: 0 %
Eosinophils Absolute: 0.2 10*3/uL (ref 0.0–0.5)
Eosinophils Relative: 2 %
HCT: 49.4 % — ABNORMAL HIGH (ref 36.0–46.0)
Hemoglobin: 16.4 g/dL — ABNORMAL HIGH (ref 12.0–15.0)
Immature Granulocytes: 0 %
Lymphocytes Relative: 14 %
Lymphs Abs: 1.3 10*3/uL (ref 0.7–4.0)
MCH: 33.3 pg (ref 26.0–34.0)
MCHC: 33.2 g/dL (ref 30.0–36.0)
MCV: 100.4 fL — ABNORMAL HIGH (ref 80.0–100.0)
Monocytes Absolute: 0.8 10*3/uL (ref 0.1–1.0)
Monocytes Relative: 8 %
Neutro Abs: 7 10*3/uL (ref 1.7–7.7)
Neutrophils Relative %: 76 %
Platelets: 49 10*3/uL — ABNORMAL LOW (ref 150–400)
RBC: 4.92 MIL/uL (ref 3.87–5.11)
RDW: 12.9 % (ref 11.5–15.5)
WBC: 9.2 10*3/uL (ref 4.0–10.5)
nRBC: 0 % (ref 0.0–0.2)

## 2022-01-09 MED ORDER — ROMIPLOSTIM INJECTION 500 MCG
5.0000 ug/kg | Freq: Once | SUBCUTANEOUS | Status: AC
  Administered 2022-01-09: 455 ug via SUBCUTANEOUS
  Filled 2022-01-09: qty 0.91

## 2022-01-12 ENCOUNTER — Encounter: Payer: Self-pay | Admitting: Podiatry

## 2022-01-12 NOTE — Progress Notes (Signed)
Subjective:  Patient ID: Carly [name], female    DOB: 09-21-1970,  MRN: [id number]  Chief Complaint  Patient presents with   Plantar Fasciitis    51 y.o. female presents with the above complaint.  Patient presents with follow-up of bilateral plantar fascia as she states she is doing a lot better.  She states that her pain has improved with injection she still has some residual pain.  She denies any other acute complaints.   Review of Systems: Negative except as noted in the HPI. Denies N/V/F/Ch.  Past Medical History:  Diagnosis Date   Anxiety    C. difficile diarrhea    07/04/21   Chicken pox    Depression    Elevated testosterone level in female    Emphysema of lung (HCC)    bronchitis   Frequent headaches    Hay fever    Hypertension    Idiopathic thrombocytopenic purpura (ITP) (HCC)    Positive ANA (antinuclear antibody)     Current Outpatient Medications:    acetaminophen (TYLENOL) 500 MG tablet, Take 500 mg by mouth every 6 (six) hours as needed., Disp: , Rfl:    albuterol (PROVENTIL) (2.5 MG/3ML) 0.083% nebulizer solution, Take 3 mLs (2.5 mg total) by nebulization every 6 (six) hours as needed for wheezing or shortness of breath., Disp: 150 mL, Rfl: 1   albuterol (VENTOLIN HFA) 108 (90 Base) MCG/ACT inhaler, INHALE 2 PUFFS INTO THE LUNGS EVERY 6 HOURS AS NEEDED FOR WHEEZING OR SHORTNESS OF BREATH (COUGH), Disp: 6.7 each, Rfl: 3   ALPRAZolam (XANAX) 0.25 MG tablet, Take 1 tablet (0.25 mg total) by mouth daily as needed for anxiety., Disp: 30 tablet, Rfl: 2   amLODipine (NORVASC) 10 MG tablet, Take 1 tablet (10 mg total) by mouth daily., Disp: 90 tablet, Rfl: 3   azelastine (OPTIVAR) 0.05 % ophthalmic solution, Place 1 drop into both eyes 2 (two) times daily., Disp: 6 mL, Rfl: 0   benzonatate (TESSALON) 100 MG capsule, Take 1 capsule (100 mg total) by mouth 3 (three) times daily as needed for cough., Disp: 30 capsule, Rfl: 0   BREZTRI AEROSPHERE 160-9-4.8 MCG/ACT AERO,  INHALE 2 PUFFS INTO THE LUNGS IN THE MORNING AND AT BEDTIME., Disp: 10.7 each, Rfl: 5   carvedilol (COREG) 25 MG tablet, Take 1 tablet (25 mg total) by mouth 2 (two) times daily with a meal., Disp: 180 tablet, Rfl: 3   Cholecalciferol 1.25 MG (50000 UT) capsule, Take 1 capsule (50,000 Units total) by mouth every 30 (thirty) days. 1x per month note this, Disp: 13 capsule, Rfl: 0   cyanocobalamin (,VITAMIN B-12,) 1000 MCG/ML injection, INJECT 1 ML (1,000 MCG TOTAL) INTO THE MUSCLE EVERY 30 DAYS., Disp: 1 mL, Rfl: 15   cyclobenzaprine (FLEXERIL) 10 MG tablet, Take 1 tablet (10 mg total) by mouth 3 (three) times daily as needed for muscle spasms. Will cause drowsiness do not drive or operate machinery while using, Disp: 15 tablet, Rfl: 0   diclofenac Sodium (VOLTAREN) 1 % GEL, Apply 2-4 g topically 4 (four) times daily. 2 grams upper body qid prn and 4 gram lower body qid prn, Disp: 150 g, Rfl: 11   dicyclomine (BENTYL) 10 MG capsule, TAKE 1 CAPSULE (10 MG TOTAL) BY MOUTH 3 (THREE) TIMES DAILY AS NEEDED FOR SPASMS. BEFORE FOOD, Disp: 90 capsule, Rfl: 11   escitalopram (LEXAPRO) 20 MG tablet, TAKE 1 TABLET BY MOUTH EVERY DAY, Disp: 90 tablet, Rfl: 3   fluticasone (FLONASE) 50 MCG/ACT nasal spray,  SPRAY 1 SPRAY INTO BOTH NOSTRILS DAILY., Disp: 16 mL, Rfl: 2   levocetirizine (XYZAL) 5 MG tablet, Take 1 tablet (5 mg total) by mouth every evening., Disp: 30 tablet, Rfl: 0   nicotine (NICOTROL) 10 MG inhaler, Inhale 1 Cartridge (1 continuous puffing total) into the lungs as needed for smoking cessation (No more than 16 cartridges per day)., Disp: 36 each, Rfl: 1   omeprazole (PRILOSEC) 20 MG capsule, TAKE 1 CAPSULE BY MOUTH EVERY DAY, Disp: 90 capsule, Rfl: 3   ondansetron (ZOFRAN) 4 MG tablet, TAKE 1 TABLET BY MOUTH EVERY 8 HOURS AS NEEDED FOR NAUSEA AND VOMITING, Disp: 10 tablet, Rfl: 11   spironolactone (ALDACTONE) 25 MG tablet, Take 1 tablet (25 mg total) by mouth daily. In am, Disp: 90 tablet, Rfl: 3    Syringe/Needle, Disp, 25G X 1-1/2" 1 ML MISC, 1 Device by Does not apply route as directed., Disp: 30 each, Rfl: 11   traMADol (ULTRAM) 50 MG tablet, Take 1 tablet (50 mg total) by mouth every 12 (twelve) hours as needed., Disp: 60 tablet, Rfl: 0   traZODone (DESYREL) 50 MG tablet, TAKE 0.5-1 TABLETS BY MOUTH AT BEDTIME AS NEEDED FOR SLEEP., Disp: 90 tablet, Rfl: 3  Social History   Tobacco Use  Smoking Status Every Day   Packs/day: 1.50   Years: 28.00   Total pack years: 42.00   Types: Cigarettes  Smokeless Tobacco Never  Tobacco Comments   0.75 PPD 11/24/2021    Allergies  Allergen Reactions   Meloxicam Nausea Only   Objective:  There were no vitals filed for this visit. There is no height or weight on file to calculate BMI. Constitutional Well developed. Well nourished.  Vascular Dorsalis pedis pulses palpable bilaterally. Posterior tibial pulses palpable bilaterally. Capillary refill normal to all digits.  No cyanosis or clubbing noted. Pedal hair growth normal.  Neurologic Normal speech. Oriented to person, place, and time. Epicritic sensation to light touch grossly present bilaterally.  Dermatologic Nails well groomed and normal in appearance. No open wounds. No skin lesions.  Orthopedic: Normal joint ROM without pain or crepitus bilaterally. No visible deformities. Tender to palpation at the calcaneal tuber bilaterally. No pain with calcaneal squeeze bilaterally. Ankle ROM diminished range of motion bilaterally. Silfverskiold Test: positive bilaterally.   Radiographs: Taken and reviewed. No acute fractures or dislocations. No evidence of stress fracture.  Plantar heel spur present. Posterior heel spur absent.   Assessment:   1. Plantar fasciitis of right foot   2. Plantar fasciitis of left foot   3. Pes planovalgus      Plan:  Patient was evaluated and treated and all questions answered.  Plantar Fasciitis, bilaterally - XR reviewed as above.  -  Educated on icing and stretching. Instructions given.  -Second injection delivered to the plantar fascia as below. - DME: Plantar fascial brace dispensed to support the medial longitudinal arch of the foot and offload pressure from the heel and prevent arch collapse during weightbearing - Pharmacologic management: None  Pes planovalgus -I explained to patient the etiology of pes planovalgus and relationship with Planter fasciitis and various treatment options were discussed.  Given patient foot structure in the setting of Planter fasciitis I believe patient will benefit from custom-made orthotics to help control the hindfoot motion support the arch of the foot and take the stress away from plantar fascial.  Patient agrees with the plan like to proceed with orthotics -Orthotics were dispensed and are functioning well   Procedure: Injection  Tendon/Ligament Location: Bilateral plantar fascia at the glabrous junction; medial approach. Skin Prep: alcohol Injectate: 0.5 cc 0.5% marcaine plain, 0.5 cc of 1% Lidocaine, 0.5 cc kenalog 10. Disposition: Patient tolerated procedure well. Injection site dressed with a band-aid.  No follow-ups on file.

## 2022-02-05 ENCOUNTER — Encounter: Payer: Self-pay | Admitting: Podiatry

## 2022-02-06 ENCOUNTER — Other Ambulatory Visit: Payer: Self-pay | Admitting: Podiatry

## 2022-02-06 MED ORDER — KETOROLAC TROMETHAMINE 10 MG PO TABS
10.0000 mg | ORAL_TABLET | Freq: Four times a day (QID) | ORAL | 0 refills | Status: DC | PRN
Start: 2022-02-06 — End: 2022-06-07

## 2022-02-15 ENCOUNTER — Other Ambulatory Visit: Payer: Self-pay | Admitting: Internal Medicine

## 2022-02-15 DIAGNOSIS — F419 Anxiety disorder, unspecified: Secondary | ICD-10-CM

## 2022-02-15 NOTE — Progress Notes (Signed)
[place]  [phone] [phone]  ID: Carly [name] OB: [birth year]  MR#: [id number]  DTO#:[id number]  Patient Care Team: McLean-Scocuzza, Nino Glow, MD as PCP - General (Internal Medicine) Lloyd Huger, MD as Consulting Physician (Oncology)   CHIEF COMPLAINT: ITP.  INTERVAL HISTORY: Patient returns to clinic today for repeat laboratory work, further evaluation, and continuation of Nplate.  She noted mild increase in the bruising on her arms, but otherwise feels well and is asymptomatic.  She has no neurologic complaints.  She denies any recent fevers or illnesses.  She has a good appetite and denies weight loss.  She denies any chest pain, shortness of breath, cough, or hemoptysis. She denies any nausea, vomiting, constipation, or diarrhea.  She has no urinary complaints.  Patient offers no further specific complaints today.  REVIEW OF SYSTEMS:   Review of Systems  Constitutional: Negative.  Negative for fever, malaise/fatigue and weight loss.  Respiratory: Negative.  Negative for cough, hemoptysis and shortness of breath.   Cardiovascular: Negative.  Negative for chest pain and leg swelling.  Gastrointestinal: Negative.  Negative for abdominal pain, blood in stool and melena.  Genitourinary: Negative.  Negative for hematuria.  Musculoskeletal: Negative.  Negative for back pain.  Skin: Negative.  Negative for rash.  Neurological: Negative.  Negative for dizziness, focal weakness, weakness and headaches.  Endo/Heme/Allergies: Negative.  Does not bruise/bleed easily.  Psychiatric/Behavioral: Negative.  The patient is not nervous/anxious.     As per HPI. Otherwise, a complete review of systems is negative.  PAST MEDICAL HISTORY: Past Medical History:  Diagnosis Date   Anxiety    C. difficile diarrhea    07/04/21   Chicken pox    Depression    Elevated testosterone level in female    Emphysema of lung (HCC)    bronchitis   Frequent  headaches    Hay fever    Hypertension    Idiopathic thrombocytopenic purpura (ITP) (HCC)    Positive ANA (antinuclear antibody)     PAST SURGICAL HISTORY: Past Surgical History:  Procedure Laterality Date   HEMATOMA EVACUATION N/A 04/07/2020   Procedure: EVACUATION HEMATOMA  AND APPLICATION OF PRESSURE DRESSING;  Surgeon: Benjaman Kindler, MD;  Location: [facility];  Service: Gynecology;  Laterality: N/A;   HYSTEROSCOPY WITH NOVASURE N/A 02/21/2018   Procedure: HYSTEROSCOPY WITH NOVASURE, POLYPECTOMY;  Surgeon: Benjaman Kindler, MD;  Location: [facility];  Service: Gynecology;  Laterality: N/A;   TUBAL LIGATION  1997   TUBAL LIGATION      FAMILY HISTORY: Family History  Problem Relation Age of Onset   Hyperlipidemia Mother    Heart disease Mother    Stroke Mother    Depression Mother    Mental illness Mother    Heart attack Mother 85   Hyperlipidemia Father    Depression Father    Mental illness Father    Diabetes Paternal Grandfather    Cancer Cousin     ADVANCED DIRECTIVES (Y/N):  N  HEALTH MAINTENANCE: Social History   Tobacco Use   Smoking status: Every Day    Packs/day: 1.50    Years: 28.00    Total pack years: 42.00    Types: Cigarettes   Smokeless tobacco: Never   Tobacco comments:    0.75 PPD 11/24/2021  Vaping Use   Vaping Use: Never used  Substance Use Topics   Alcohol use: Yes    Alcohol/week: 8.0 standard drinks of alcohol    Types: 8 Standard drinks or equivalent  per week    Comment: Beer (Can)    Drug use: No    Comment: CBD oil     Colonoscopy:  PAP:  Bone density:  Lipid panel:  Allergies  Allergen Reactions   Meloxicam Nausea Only    Current Outpatient Medications  Medication Sig Dispense Refill   acetaminophen (TYLENOL) 500 MG tablet Take 500 mg by mouth every 6 (six) hours as needed.     albuterol (PROVENTIL) (2.5 MG/3ML) 0.083% nebulizer solution Take 3 mLs (2.5 mg total) by nebulization every 6 (six) hours as needed for wheezing or  shortness of breath. 150 mL 1   albuterol (VENTOLIN HFA) 108 (90 Base) MCG/ACT inhaler INHALE 2 PUFFS INTO THE LUNGS EVERY 6 HOURS AS NEEDED FOR WHEEZING OR SHORTNESS OF BREATH (COUGH) 6.7 each 3   ALPRAZolam (XANAX) 0.25 MG tablet Take 1 tablet (0.25 mg total) by mouth daily as needed for anxiety. 30 tablet 2   amLODipine (NORVASC) 10 MG tablet Take 1 tablet (10 mg total) by mouth daily. 90 tablet 3   azelastine (OPTIVAR) 0.05 % ophthalmic solution Place 1 drop into both eyes 2 (two) times daily. 6 mL 0   BREZTRI AEROSPHERE 160-9-4.8 MCG/ACT AERO INHALE 2 PUFFS INTO THE LUNGS IN THE MORNING AND AT BEDTIME. 10.7 each 5   carvedilol (COREG) 25 MG tablet Take 1 tablet (25 mg total) by mouth 2 (two) times daily with a meal. 180 tablet 3   Cholecalciferol 1.25 MG (50000 UT) capsule Take 1 capsule (50,000 Units total) by mouth every 30 (thirty) days. 1x per month note this 13 capsule 0   cyanocobalamin (,VITAMIN B-12,) 1000 MCG/ML injection INJECT 1 ML (1,000 MCG TOTAL) INTO THE MUSCLE EVERY 30 DAYS. 1 mL 15   cyclobenzaprine (FLEXERIL) 10 MG tablet Take 1 tablet (10 mg total) by mouth 3 (three) times daily as needed for muscle spasms. Will cause drowsiness do not drive or operate machinery while using 15 tablet 0   diclofenac Sodium (VOLTAREN) 1 % GEL Apply 2-4 g topically 4 (four) times daily. 2 grams upper body qid prn and 4 gram lower body qid prn 150 g 11   dicyclomine (BENTYL) 10 MG capsule TAKE 1 CAPSULE (10 MG TOTAL) BY MOUTH 3 (THREE) TIMES DAILY AS NEEDED FOR SPASMS. BEFORE FOOD 90 capsule 11   escitalopram (LEXAPRO) 20 MG tablet TAKE 1 TABLET BY MOUTH EVERY DAY 90 tablet 4   fluticasone (FLONASE) 50 MCG/ACT nasal spray SPRAY 1 SPRAY INTO BOTH NOSTRILS DAILY. 16 mL 2   levocetirizine (XYZAL) 5 MG tablet Take 1 tablet (5 mg total) by mouth every evening. 30 tablet 0   meloxicam (MOBIC) 15 MG tablet Take 1 tablet every day by oral route.     nicotine (NICOTROL) 10 MG inhaler Inhale 1 Cartridge (1  continuous puffing total) into the lungs as needed for smoking cessation (No more than 16 cartridges per day). 36 each 1   omeprazole (PRILOSEC) 20 MG capsule TAKE 1 CAPSULE BY MOUTH EVERY DAY 90 capsule 3   ondansetron (ZOFRAN) 4 MG tablet TAKE 1 TABLET BY MOUTH EVERY 8 HOURS AS NEEDED FOR NAUSEA AND VOMITING 10 tablet 11   spironolactone (ALDACTONE) 25 MG tablet Take 1 tablet (25 mg total) by mouth daily. In am 90 tablet 3   Syringe/Needle, Disp, 25G X 1-1/2" 1 ML MISC 1 Device by Does not apply route as directed. 30 each 11   traMADol (ULTRAM) 50 MG tablet Take 1 tablet (50 mg total) by  mouth every 12 (twelve) hours as needed. 60 tablet 0   traZODone (DESYREL) 50 MG tablet TAKE 0.5-1 TABLETS BY MOUTH AT BEDTIME AS NEEDED FOR SLEEP. 90 tablet 3   benzonatate (TESSALON) 100 MG capsule Take 1 capsule (100 mg total) by mouth 3 (three) times daily as needed for cough. (Patient not taking: Reported on 02/20/2022) 30 capsule 0   ketorolac (TORADOL) 10 MG tablet Take 1 tablet (10 mg total) by mouth every 6 (six) hours as needed. (Patient not taking: Reported on 02/20/2022) 20 tablet 0   No current facility-administered medications for this visit.    OBJECTIVE: Vitals:   02/20/22 1400  BP: 129/70  Pulse: 77  Resp: 18  SpO2: 98%     Body mass index is 36.1 kg/m.    ECOG FS:0 - Asymptomatic  General: Well-developed, well-nourished, no acute distress. Eyes: Pink conjunctiva, anicteric sclera. HEENT: Normocephalic, moist mucous membranes. Lungs: No audible wheezing or coughing. Heart: Regular rate and rhythm. Abdomen: Soft, nontender, no obvious distention. Musculoskeletal: No edema, cyanosis, or clubbing. Neuro: Alert, answering all questions appropriately. Cranial nerves grossly intact. Skin: No rashes or petechiae noted.  Mild bruising on upper extremities. Psych: Normal affect.   LAB RESULTS:  Lab Results  Component Value Date   NA 139 05/04/2021   K 3.8 05/04/2021   CL 101 05/04/2021    CO2 22 05/04/2021   GLUCOSE 75 05/04/2021   BUN 8 05/04/2021   CREATININE 0.61 05/04/2021   CALCIUM 9.1 05/04/2021   PROT 7.0 05/04/2021   ALBUMIN 4.5 05/04/2021   AST 41 (H) 05/04/2021   ALT 30 05/04/2021   ALKPHOS 96 05/04/2021   BILITOT 0.6 05/04/2021   GFRNONAA >60 04/08/2021   GFRAA >60 04/08/2020    Lab Results  Component Value Date   WBC 9.7 02/20/2022   NEUTROABS 5.7 02/20/2022   HGB 15.3 (H) 02/20/2022   HCT 44.7 02/20/2022   MCV 99.3 02/20/2022   PLT 43 (L) 02/20/2022     STUDIES: No results found.  ASSESSMENT: ITP  PLAN:    1.  ITP: Confirmed with normal bone marrow biopsy and normal FISH and cytogenetics on July 22, 2019.  Patient had a positive ANA which is likely clinically insignificant.  The remainder of her laboratory work was either negative or within normal limits. CT scan on March 04, 2017 did not reveal splenomegaly.  Previously, patient received 1 week of prednisone at 1 mg/kg dose and had no appreciable change in her platelet count.  Rituxan was denied by insurance.  Previously, patient received 3 mcg/kg of Nplate with her platelet count increasing from 33-287.  Patient's platelet count is essentially stable at 43 today.  Proceed with 5 mcg/kg Nplate today.  Return to clinic in 6 weeks for laboratory work and treatment and then in 12 weeks laboratory work, further evaluation, and continuation of treatment.  At that point, can consider increasing patient's dose in order to achieve less frequent injections.   2.  History of positive ANA: Likely clinically insignificant.  Referral was previously sent to rheumatology.  I spent a total of 20 minutes reviewing chart data, face-to-face evaluation with the patient, counseling and coordination of care as detailed above.    Patient expressed understanding and was in agreement with this plan. She also understands that She can call clinic at any time with any questions, concerns, or complaints.    Lloyd Huger, MD   02/21/2022 6:22 AM

## 2022-02-20 ENCOUNTER — Inpatient Hospital Stay (HOSPITAL_BASED_OUTPATIENT_CLINIC_OR_DEPARTMENT_OTHER): Payer: BC Managed Care – PPO | Admitting: Oncology

## 2022-02-20 ENCOUNTER — Inpatient Hospital Stay: Payer: BC Managed Care – PPO

## 2022-02-20 ENCOUNTER — Encounter: Payer: Self-pay | Admitting: Oncology

## 2022-02-20 ENCOUNTER — Inpatient Hospital Stay: Payer: BC Managed Care – PPO | Attending: Oncology

## 2022-02-20 VITALS — BP 129/70 | HR 77 | Resp 18 | Wt 197.4 lb

## 2022-02-20 DIAGNOSIS — D693 Immune thrombocytopenic purpura: Secondary | ICD-10-CM

## 2022-02-20 LAB — CBC WITH DIFFERENTIAL/PLATELET
Abs Immature Granulocytes: 0.03 10*3/uL (ref 0.00–0.07)
Basophils Absolute: 0 10*3/uL (ref 0.0–0.1)
Basophils Relative: 0 %
Eosinophils Absolute: 0.2 10*3/uL (ref 0.0–0.5)
Eosinophils Relative: 2 %
HCT: 44.7 % (ref 36.0–46.0)
Hemoglobin: 15.3 g/dL — ABNORMAL HIGH (ref 12.0–15.0)
Immature Granulocytes: 0 %
Lymphocytes Relative: 28 %
Lymphs Abs: 2.7 10*3/uL (ref 0.7–4.0)
MCH: 34 pg (ref 26.0–34.0)
MCHC: 34.2 g/dL (ref 30.0–36.0)
MCV: 99.3 fL (ref 80.0–100.0)
Monocytes Absolute: 1 10*3/uL (ref 0.1–1.0)
Monocytes Relative: 10 %
Neutro Abs: 5.7 10*3/uL (ref 1.7–7.7)
Neutrophils Relative %: 60 %
Platelets: 43 10*3/uL — ABNORMAL LOW (ref 150–400)
RBC: 4.5 MIL/uL (ref 3.87–5.11)
RDW: 13.4 % (ref 11.5–15.5)
WBC: 9.7 10*3/uL (ref 4.0–10.5)
nRBC: 0 % (ref 0.0–0.2)

## 2022-02-20 MED ORDER — ROMIPLOSTIM INJECTION 500 MCG
5.0000 ug/kg | Freq: Once | SUBCUTANEOUS | Status: AC
  Administered 2022-02-20: 450 ug via SUBCUTANEOUS
  Filled 2022-02-20: qty 0.9

## 2022-02-20 NOTE — Progress Notes (Unsigned)
Pt states she has been experiencing random bruising all over her body.

## 2022-02-21 ENCOUNTER — Encounter: Payer: Self-pay | Admitting: Oncology

## 2022-03-01 ENCOUNTER — Other Ambulatory Visit: Payer: Self-pay

## 2022-03-01 ENCOUNTER — Other Ambulatory Visit: Payer: Self-pay | Admitting: Podiatry

## 2022-03-01 DIAGNOSIS — J441 Chronic obstructive pulmonary disease with (acute) exacerbation: Secondary | ICD-10-CM

## 2022-03-01 MED ORDER — ALBUTEROL SULFATE (2.5 MG/3ML) 0.083% IN NEBU: 2.5000 mg | INHALATION_SOLUTION | Freq: Four times a day (QID) | RESPIRATORY_TRACT | 1 refills | Status: DC | PRN

## 2022-03-01 NOTE — Telephone Encounter (Signed)
Please advise 

## 2022-03-01 NOTE — Telephone Encounter (Signed)
Albuterol has been sent to [facility] as requested by pharmacy.

## 2022-03-02 ENCOUNTER — Telehealth: Payer: BC Managed Care – PPO | Admitting: Family Medicine

## 2022-03-02 DIAGNOSIS — J440 Chronic obstructive pulmonary disease with acute lower respiratory infection: Secondary | ICD-10-CM

## 2022-03-02 DIAGNOSIS — J209 Acute bronchitis, unspecified: Secondary | ICD-10-CM | POA: Diagnosis not present

## 2022-03-02 MED ORDER — PREDNISONE 10 MG PO TABS: 10.0000 mg | ORAL_TABLET | Freq: Every day | ORAL | 0 refills | Status: DC

## 2022-03-02 MED ORDER — PROMETHAZINE-DM 6.25-15 MG/5ML PO SYRP: 5.0000 mL | ORAL_SOLUTION | Freq: Four times a day (QID) | ORAL | 0 refills | Status: AC | PRN

## 2022-03-02 MED ORDER — AZITHROMYCIN 250 MG PO TABS: ORAL_TABLET | ORAL | 0 refills | Status: AC

## 2022-03-02 NOTE — Patient Instructions (Signed)
Chronic Obstructive Pulmonary Disease  Chronic obstructive pulmonary disease (COPD) is a long-term (chronic) condition that affects the lungs. COPD is a general term that can be used to describe many different lung problems that cause lung inflammation and limit airflow, including chronic bronchitis and emphysema. If you have COPD, your lung function will probably never return to normal. In most cases, it gets worse over time. However, there are steps you can take to slow the progression of the disease and improve your quality of life. What are the causes? This condition may be caused by: Smoking. This is the most common cause. Certain genes passed down through families. What increases the risk? The following factors may make you more likely to develop this condition: Being exposed to secondhand smoke from cigarettes, pipes, or cigars. Being exposed to chemicals and other irritants, such as fumes and dust in the work environment. Having chronic lung conditions or infections. What are the signs or symptoms? Symptoms of this condition include: Shortness of breath, especially during physical activity. Chronic cough with a large amount of thick mucus. Sometimes, the cough may not have any mucus (dry cough). Wheezing and rapid breathing. Gray or bluish discoloration (cyanosis) of the skin, especially in the fingers, toes, or lips. Feeling tired (fatigue). Weight loss. Chest tightness. Frequent infections. Episodes when breathing symptoms become much worse (exacerbations). At the later stages of this disease, you may have swelling in the ankles, feet, or legs. How is this diagnosed? This condition is diagnosed based on: Your medical history. A physical exam. You may also have tests, including: Lung (pulmonary) function tests. This may include a spirometry test, which measures your ability to exhale properly. Chest X-ray. CT scan. Blood tests. How is this treated? This condition may be  treated with: Medicines. These may include inhaled rescue medicines to treat acute exacerbations as well as medicines that you take long-term (maintenance medicines) to prevent flare-ups of COPD. Bronchodilators help treat COPD by dilating the airways to allow increased airflow and make your breathing more comfortable. Steroids can reduce airway inflammation and help prevent exacerbations. Smoking cessation. If you smoke, your health care provider may ask you to quit, and may also recommend therapy or replacement products to help you quit. Pulmonary rehabilitation. This may involve working with a team of health care providers and specialists, such as respiratory, occupational, and physical therapists. Exercise and physical activity. These are beneficial for nearly all people with COPD. Nutrition therapy to gain weight, if you are underweight. Oxygen. Supplemental oxygen therapy is only helpful if you have a low oxygen level in your blood (hypoxemia). Lung surgery or transplant. Palliative care. This is to help people with COPD feel comfortable when treatment is no longer working. Follow these instructions at home: Medicines Take over-the-counter and prescription medicines only as told by your health care provider. This includes inhaled medicines and pills. Talk to your health care provider before taking any cough or allergy medicines. You may need to avoid certain medicines that dry out your airways. Lifestyle If you smoke, the most important thing that you can do is to stop smoking. Continuing to smoke will cause the disease to progress faster. Do not use any products that contain nicotine or tobacco. These products include cigarettes, chewing tobacco, and vaping devices, such as e-cigarettes. If you need help quitting, ask your health care provider. Avoid exposure to things that irritate your lungs, such as smoke, chemicals, and fumes. Stay active, but balance activity with periods of rest.  Exercise and   physical activity will help you maintain your ability to do things you want to do. Learn and use relaxation techniques to manage stress and to control your breathing. Get the right amount of sleep and get quality sleep. Most adults need 7 or more hours per night. Eat healthy foods. Eating smaller, more frequent meals and resting before meals may help you maintain your strength. Controlled breathing Learn and use controlled breathing techniques as directed by your health care provider. Controlled breathing techniques include: Pursed lip breathing. Start by breathing in (inhaling) through your nose for 1 second. Then, purse your lips as if you were going to whistle and breathe out (exhale) through the pursed lips for 2 seconds. Diaphragmatic breathing. Start by putting one hand on your abdomen just above your waist. Inhale slowly through your nose. The hand on your abdomen should move out. Then purse your lips and exhale slowly. You should be able to feel the hand on your abdomen moving in as you exhale.  Controlled coughing Learn and use controlled coughing to clear mucus from your lungs. Controlled coughing is a series of short, progressive coughs. The steps of controlled coughing are: Lean your head slightly forward. Breathe in deeply using diaphragmatic breathing. Try to hold your breath for 3 seconds. Keep your mouth slightly open while coughing twice. Spit any mucus out into a tissue. Rest and repeat the steps once or twice as needed. General instructions Make sure you receive all the vaccines that your health care provider recommends, especially the pneumococcal and influenza vaccines. Preventing infection and hospitalization is very important when you have COPD. Drink enough fluid to keep your urine pale yellow, unless you have a medical condition that requires fluid restriction. Use oxygen therapy and pulmonary rehabilitation if told by your health care provider. If you  require home oxygen therapy, ask your health care provider whether you should purchase a pulse oximeter to measure your oxygen level at home. Work with your health care provider to develop a COPD action plan. This will help you know what steps to take if your condition gets worse. Keep other chronic health conditions under control as told by your health care provider. Avoid extreme temperature and humidity changes. Avoid contact with people who have an illness that spreads from person to person (is contagious), such as viral infections or pneumonia. Keep all follow-up visits. This is important. Contact a health care provider if: You are coughing up more mucus than usual. There is a change in the color or thickness of your mucus. Your breathing is more labored than usual. Your breathing is faster than usual. You have difficulty sleeping. You need to use your rescue medicines or inhalers more often than expected. You have trouble doing routine activities such as getting dressed or walking around the house. Get help right away if: You have shortness of breath while you are resting. You have shortness of breath that prevents you from: Being able to talk. Performing your usual physical activities. You have chest pain lasting longer than 5 minutes. Your skin color is more blue (cyanotic) than usual. You measure low oxygen saturations for longer than 5 minutes with a pulse oximeter. You have a fever. You feel too tired to breathe normally. These symptoms may represent a serious problem that is an emergency. Do not wait to see if the symptoms will go away. Get medical help right away. Call your local emergency services (911 in the [place] Do not drive yourself to the hospital. Summary Chronic obstructive   pulmonary disease (COPD) is a long-term (chronic) condition that affects the lungs. Your lung function will probably never return to normal. In most cases, it gets worse over time. However, there  are steps you can take to slow the progression of the disease and improve your quality of life. Treatment for COPD may include taking medicines, quitting smoking, pulmonary rehabilitation, and changes to diet and exercise. As the disease progresses, you may need oxygen therapy, a lung transplant, or palliative care. To help manage your condition, do not smoke, avoid exposure to things that irritate your lungs, stay up to date on all vaccines, and follow your health care provider's instructions for taking medicines. This information is not intended to replace advice given to you by your health care provider. Make sure you discuss any questions you have with your health care provider. Document Revised: 05/10/2020 Document Reviewed: 05/10/2020 Elsevier Patient Education  2023 [employer]  

## 2022-03-02 NOTE — Progress Notes (Signed)
Virtual Visit Consent   Carly [name], you are scheduled for a virtual visit with a [facility] provider today. Just as with appointments in the office, your consent must be obtained to participate. Your consent will be active for this visit and any virtual visit you may have with one of our providers in the next 365 days. If you have a MyChart account, a copy of this consent can be sent to you electronically.  As this is a virtual visit, video technology does not allow for your provider to perform a traditional examination. This may limit your provider's ability to fully assess your condition. If your provider identifies any concerns that need to be evaluated in person or the need to arrange testing (such as labs, EKG, etc.), we will make arrangements to do so. Although advances in technology are sophisticated, we cannot ensure that it will always work on either your end or our end. If the connection with a video visit is poor, the visit may have to be switched to a telephone visit. With either a video or telephone visit, we are not always able to ensure that we have a secure connection.  By engaging in this virtual visit, you consent to the provision of healthcare and authorize for your insurance to be billed (if applicable) for the services provided during this visit. Depending on your insurance coverage, you may receive a charge related to this service.  I need to obtain your verbal consent now. Are you willing to proceed with your visit today? [name] has provided verbal consent on 03/02/2022 for a virtual visit (video or telephone). Dellia Nims, FNP  Date: 03/02/2022 7:48 AM  Virtual Visit via Video Note   I, Dellia Nims, [name]  ([id number], [birth year]) on 03/02/22 at  7:45 AM EDT by a video-enabled telemedicine application and verified that I am speaking with the correct person using two identifiers.  Location: Patient: Virtual Visit Location Patient:  Home Provider: Virtual Visit Location Provider: Home Office   I discussed the limitations of evaluation and management by telemedicine and the availability of in person appointments. The patient expressed understanding and agreed to proceed.    History of Present Illness: Carly [name] is a 51 y.o. who identifies as a female who was assigned female at birth, and is being seen today for cough, wheezing and SOB with a history of copd. She reports prod cough with yellow green mucus, fever and chills. Sx for a week worsening. Marland Kitchen  HPI: HPI  Problems:  Patient Active Problem List   Diagnosis Date Noted   Allergic rhinitis 11/24/2021   Stiff back 08/11/2021   Acute bronchitis with COPD ([place]) 07/13/2021   Upper respiratory infection 07/13/2021   Lumbar radiculopathy 07/11/2021   Low back pain 07/11/2021   C. difficile diarrhea 07/04/2021   Gastroesophageal reflux disease without esophagitis 07/01/2020   Hyperlipidemia 07/01/2020   Posttraumatic vulvar hematoma 04/07/2020   Dizziness 03/29/2020   Fatty liver 12/24/2019   Angiolipoma of left kidney 12/24/2019   Chronic ITP (idiopathic thrombocytopenia) ([facility]) 12/24/2019   Chronic ITP (idiopathic thrombocytopenic purpura) ([place]) 12/02/2019   Idiopathic thrombocytopenic purpura (ITP) ([place]) 11/10/2019   Verruca vulgaris 09/29/2019   Plantar fasciitis, bilateral 09/29/2019   Osteoarthritis of both knees 09/29/2019   Overactive bladder 07/14/2019   B12 deficiency 06/10/2019   Vitamin D deficiency 06/10/2019   Headache 01/30/2019   Elevated LFTs 01/30/2019   Abnormal uterine bleeding (AUB) 08/26/2018   Depression,  major, single episode, moderate ([place]) 08/04/2018   Iron deficiency 09/02/2017   Menorrhagia with regular cycle 10/22/2016   Insomnia 08/15/2016   Nocturia 06/18/2016   Thrombocytopenia ([place]) 02/12/2016   COPD exacerbation ([place]) 01/31/2016   Carpal tunnel syndrome 01/20/2016   Lateral epicondylitis 01/20/2016   Obesity 01/04/2016    Palpitations 12/15/2015   Myalgia 10/31/2015   Fatigue 10/31/2015   Mixed incontinence 03/09/2015   COPD (chronic obstructive pulmonary disease) ([place]) 01/13/2015   Acute bronchitis 11/15/2014   Female hirsutism 09/22/2014   Elevated testosterone level in female 09/22/2014   Generalized anxiety disorder 08/23/2014   Tobacco use disorder 08/23/2014   Obesity (BMI 30-39.9) 08/23/2014   HTN (hypertension) 08/23/2014    Allergies:  Allergies  Allergen Reactions   Meloxicam Nausea Only   Medications:  Current Outpatient Medications:    azithromycin (ZITHROMAX) 250 MG tablet, Take 2 tablets on day 1, then 1 tablet daily on days 2 through 5, Disp: 6 tablet, Rfl: 0   predniSONE (DELTASONE) 10 MG tablet, Take 1 tablet (10 mg total) by mouth daily with breakfast. Prednisone 10 mg- 6 day dose pack as directed., Disp: 1 tablet, Rfl: 0   promethazine-dextromethorphan (PROMETHAZINE-DM) 6.25-15 MG/5ML syrup, Take 5 mLs by mouth 4 (four) times daily as needed for up to 10 days for cough., Disp: 118 mL, Rfl: 0   acetaminophen (TYLENOL) 500 MG tablet, Take 500 mg by mouth every 6 (six) hours as needed., Disp: , Rfl:    albuterol (PROVENTIL) (2.5 MG/3ML) 0.083% nebulizer solution, Take 3 mLs (2.5 mg total) by nebulization every 6 (six) hours as needed for wheezing or shortness of breath., Disp: 360 mL, Rfl: 1   albuterol (VENTOLIN HFA) 108 (90 Base) MCG/ACT inhaler, INHALE 2 PUFFS INTO THE LUNGS EVERY 6 HOURS AS NEEDED FOR WHEEZING OR SHORTNESS OF BREATH (COUGH), Disp: 6.7 each, Rfl: 3   ALPRAZolam (XANAX) 0.25 MG tablet, Take 1 tablet (0.25 mg total) by mouth daily as needed for anxiety., Disp: 30 tablet, Rfl: 2   amLODipine (NORVASC) 10 MG tablet, Take 1 tablet (10 mg total) by mouth daily., Disp: 90 tablet, Rfl: 3   azelastine (OPTIVAR) 0.05 % ophthalmic solution, Place 1 drop into both eyes 2 (two) times daily., Disp: 6 mL, Rfl: 0   benzonatate (TESSALON) 100 MG capsule, Take 1 capsule (100 mg total)  by mouth 3 (three) times daily as needed for cough. (Patient not taking: Reported on 02/20/2022), Disp: 30 capsule, Rfl: 0   BREZTRI AEROSPHERE 160-9-4.8 MCG/ACT AERO, INHALE 2 PUFFS INTO THE LUNGS IN THE MORNING AND AT BEDTIME., Disp: 10.7 each, Rfl: 5   carvedilol (COREG) 25 MG tablet, Take 1 tablet (25 mg total) by mouth 2 (two) times daily with a meal., Disp: 180 tablet, Rfl: 3   Cholecalciferol 1.25 MG (50000 UT) capsule, Take 1 capsule (50,000 Units total) by mouth every 30 (thirty) days. 1x per month note this, Disp: 13 capsule, Rfl: 0   cyanocobalamin (,VITAMIN B-12,) 1000 MCG/ML injection, INJECT 1 ML (1,000 MCG TOTAL) INTO THE MUSCLE EVERY 30 DAYS., Disp: 1 mL, Rfl: 15   cyclobenzaprine (FLEXERIL) 10 MG tablet, Take 1 tablet (10 mg total) by mouth 3 (three) times daily as needed for muscle spasms. Will cause drowsiness do not drive or operate machinery while using, Disp: 15 tablet, Rfl: 0   diclofenac Sodium (VOLTAREN) 1 % GEL, Apply 2-4 g topically 4 (four) times daily. 2 grams upper body qid prn and 4 gram lower body qid prn, Disp:  150 g, Rfl: 11   dicyclomine (BENTYL) 10 MG capsule, TAKE 1 CAPSULE (10 MG TOTAL) BY MOUTH 3 (THREE) TIMES DAILY AS NEEDED FOR SPASMS. BEFORE FOOD, Disp: 90 capsule, Rfl: 11   escitalopram (LEXAPRO) 20 MG tablet, TAKE 1 TABLET BY MOUTH EVERY DAY, Disp: 90 tablet, Rfl: 4   fluticasone (FLONASE) 50 MCG/ACT nasal spray, SPRAY 1 SPRAY INTO BOTH NOSTRILS DAILY., Disp: 16 mL, Rfl: 2   ketorolac (TORADOL) 10 MG tablet, Take 1 tablet (10 mg total) by mouth every 6 (six) hours as needed. (Patient not taking: Reported on 02/20/2022), Disp: 20 tablet, Rfl: 0   levocetirizine (XYZAL) 5 MG tablet, Take 1 tablet (5 mg total) by mouth every evening., Disp: 30 tablet, Rfl: 0   meloxicam (MOBIC) 15 MG tablet, Take 1 tablet every day by oral route., Disp: , Rfl:    nicotine (NICOTROL) 10 MG inhaler, Inhale 1 Cartridge (1 continuous puffing total) into the lungs as needed for smoking  cessation (No more than 16 cartridges per day)., Disp: 36 each, Rfl: 1   omeprazole (PRILOSEC) 20 MG capsule, TAKE 1 CAPSULE BY MOUTH EVERY DAY, Disp: 90 capsule, Rfl: 3   ondansetron (ZOFRAN) 4 MG tablet, TAKE 1 TABLET BY MOUTH EVERY 8 HOURS AS NEEDED FOR NAUSEA AND VOMITING, Disp: 10 tablet, Rfl: 11   spironolactone (ALDACTONE) 25 MG tablet, Take 1 tablet (25 mg total) by mouth daily. In am, Disp: 90 tablet, Rfl: 3   Syringe/Needle, Disp, 25G X 1-1/2" 1 ML MISC, 1 Device by Does not apply route as directed., Disp: 30 each, Rfl: 11   traMADol (ULTRAM) 50 MG tablet, Take 1 tablet (50 mg total) by mouth every 12 (twelve) hours as needed., Disp: 60 tablet, Rfl: 0   traZODone (DESYREL) 50 MG tablet, TAKE 0.5-1 TABLETS BY MOUTH AT BEDTIME AS NEEDED FOR SLEEP., Disp: 90 tablet, Rfl: 3  Observations/Objective: Patient is well-developed, well-nourished in no acute distress.  Resting comfortably  at home.  Head is normocephalic, atraumatic.  No labored breathing.  Speech is clear and coherent with logical content.  Patient is alert and oriented at baseline.    Assessment and Plan: 1. Acute bronchitis with COPD ([place])  Increase fluids, med use and side effects discussed, urgent care if sx worsen.   Follow Up Instructions: I discussed the assessment and treatment plan with the patient. The patient was provided an opportunity to ask questions and all were answered. The patient agreed with the plan and demonstrated an understanding of the instructions.  A copy of instructions were sent to the patient via MyChart unless otherwise noted below.     The patient was advised to call back or seek an in-person evaluation if the symptoms worsen or if the condition fails to improve as anticipated.  Time:  I spent 10 minutes with the patient via telehealth technology discussing the above problems/concerns.    Dellia Nims, FNP

## 2022-03-06 ENCOUNTER — Other Ambulatory Visit: Payer: Self-pay | Admitting: Podiatry

## 2022-03-06 MED ORDER — KETOROLAC TROMETHAMINE 10 MG PO TABS: 10.0000 mg | ORAL_TABLET | Freq: Four times a day (QID) | ORAL | 0 refills | Status: DC | PRN

## 2022-03-21 ENCOUNTER — Telehealth (INDEPENDENT_AMBULATORY_CARE_PROVIDER_SITE_OTHER): Payer: BC Managed Care – PPO | Admitting: Internal Medicine

## 2022-03-21 ENCOUNTER — Ambulatory Visit (INDEPENDENT_AMBULATORY_CARE_PROVIDER_SITE_OTHER): Payer: BC Managed Care – PPO | Admitting: Pulmonary Disease

## 2022-03-21 ENCOUNTER — Encounter: Payer: Self-pay | Admitting: Pulmonary Disease

## 2022-03-21 ENCOUNTER — Other Ambulatory Visit
Admission: RE | Admit: 2022-03-21 | Discharge: 2022-03-21 | Disposition: A | Discharge status: Home / Self Care | Payer: BC Managed Care – PPO | Attending: Pulmonary Disease | Admitting: Pulmonary Disease

## 2022-03-21 ENCOUNTER — Encounter: Payer: Self-pay | Admitting: Internal Medicine

## 2022-03-21 VITALS — BP 119/84 | Ht 62.5 in | Wt 197.0 lb

## 2022-03-21 VITALS — BP 120/84 | HR 85 | Temp 98.1°F | Ht 62.5 in | Wt 197.8 lb

## 2022-03-21 DIAGNOSIS — G4700 Insomnia, unspecified: Secondary | ICD-10-CM | POA: Diagnosis not present

## 2022-03-21 DIAGNOSIS — I10 Essential (primary) hypertension: Secondary | ICD-10-CM

## 2022-03-21 DIAGNOSIS — J84115 Respiratory bronchiolitis interstitial lung disease: Secondary | ICD-10-CM | POA: Diagnosis not present

## 2022-03-21 DIAGNOSIS — J449 Chronic obstructive pulmonary disease, unspecified: Secondary | ICD-10-CM | POA: Diagnosis not present

## 2022-03-21 DIAGNOSIS — F419 Anxiety disorder, unspecified: Secondary | ICD-10-CM

## 2022-03-21 DIAGNOSIS — F17210 Nicotine dependence, cigarettes, uncomplicated: Secondary | ICD-10-CM | POA: Diagnosis not present

## 2022-03-21 DIAGNOSIS — Z1211 Encounter for screening for malignant neoplasm of colon: Secondary | ICD-10-CM | POA: Diagnosis not present

## 2022-03-21 DIAGNOSIS — Z13220 Encounter for screening for lipoid disorders: Secondary | ICD-10-CM

## 2022-03-21 DIAGNOSIS — F339 Major depressive disorder, recurrent, unspecified: Secondary | ICD-10-CM | POA: Diagnosis not present

## 2022-03-21 DIAGNOSIS — Z1389 Encounter for screening for other disorder: Secondary | ICD-10-CM

## 2022-03-21 DIAGNOSIS — J4489 Other specified chronic obstructive pulmonary disease: Secondary | ICD-10-CM

## 2022-03-21 DIAGNOSIS — F32A Depression, unspecified: Secondary | ICD-10-CM | POA: Insufficient documentation

## 2022-03-21 DIAGNOSIS — Z124 Encounter for screening for malignant neoplasm of cervix: Secondary | ICD-10-CM

## 2022-03-21 DIAGNOSIS — Z1329 Encounter for screening for other suspected endocrine disorder: Secondary | ICD-10-CM

## 2022-03-21 DIAGNOSIS — E611 Iron deficiency: Secondary | ICD-10-CM

## 2022-03-21 DIAGNOSIS — F4321 Adjustment disorder with depressed mood: Secondary | ICD-10-CM | POA: Insufficient documentation

## 2022-03-21 DIAGNOSIS — D693 Immune thrombocytopenic purpura: Secondary | ICD-10-CM

## 2022-03-21 DIAGNOSIS — R5383 Other fatigue: Secondary | ICD-10-CM

## 2022-03-21 DIAGNOSIS — R194 Change in bowel habit: Secondary | ICD-10-CM

## 2022-03-21 DIAGNOSIS — E538 Deficiency of other specified B group vitamins: Secondary | ICD-10-CM

## 2022-03-21 DIAGNOSIS — E559 Vitamin D deficiency, unspecified: Secondary | ICD-10-CM

## 2022-03-21 LAB — CBC WITH DIFFERENTIAL/PLATELET
Abs Immature Granulocytes: 0.03 10*3/uL (ref 0.00–0.07)
Basophils Absolute: 0.1 10*3/uL (ref 0.0–0.1)
Basophils Relative: 1 %
Eosinophils Absolute: 0.2 10*3/uL (ref 0.0–0.5)
Eosinophils Relative: 2 %
HCT: 48.4 % — ABNORMAL HIGH (ref 36.0–46.0)
Hemoglobin: 16.4 g/dL — ABNORMAL HIGH (ref 12.0–15.0)
Immature Granulocytes: 0 %
Lymphocytes Relative: 20 %
Lymphs Abs: 2 10*3/uL (ref 0.7–4.0)
MCH: 33.4 pg (ref 26.0–34.0)
MCHC: 33.9 g/dL (ref 30.0–36.0)
MCV: 98.6 fL (ref 80.0–100.0)
Monocytes Absolute: 0.8 10*3/uL (ref 0.1–1.0)
Monocytes Relative: 8 %
Neutro Abs: 7 10*3/uL (ref 1.7–7.7)
Neutrophils Relative %: 69 %
Platelets: 42 10*3/uL — ABNORMAL LOW (ref 150–400)
RBC: 4.91 MIL/uL (ref 3.87–5.11)
RDW: 13.5 % (ref 11.5–15.5)
WBC: 10 10*3/uL (ref 4.0–10.5)
nRBC: 0 % (ref 0.0–0.2)

## 2022-03-21 MED ORDER — CARVEDILOL 25 MG PO TABS: 25.0000 mg | ORAL_TABLET | Freq: Two times a day (BID) | ORAL | 3 refills | Status: DC

## 2022-03-21 MED ORDER — ALPRAZOLAM 0.25 MG PO TABS: 0.2500 mg | ORAL_TABLET | Freq: Every day | ORAL | 2 refills | Status: DC | PRN

## 2022-03-21 MED ORDER — METHYLPREDNISOLONE 4 MG PO TBPK: ORAL_TABLET | ORAL | 0 refills | Status: DC

## 2022-03-21 MED ORDER — AMLODIPINE BESYLATE 10 MG PO TABS: 10.0000 mg | ORAL_TABLET | Freq: Every day | ORAL | 3 refills | Status: DC

## 2022-03-21 MED ORDER — REXULTI 0.5 MG PO TABS: 0.5000 mg | ORAL_TABLET | Freq: Every day | ORAL | 0 refills | Status: DC

## 2022-03-21 MED ORDER — TRAZODONE HCL 50 MG PO TABS: ORAL_TABLET | ORAL | 3 refills | Status: DC

## 2022-03-21 NOTE — Progress Notes (Signed)
Telephone Note  I connected with Vinessa Domzalski  on 03/21/22 at  1:40 PM EDT by telephone and verified that I am speaking with the correct person using two identifiers.  Location patient: [place] Location provider:work or home office Persons participating in the virtual visit: patient, provider  I discussed the limitations and requested verbal permission for telemedicine visit. The patient expressed understanding and agreed to proceed.   HPI:  Acute telemedicine visit for : Grief sister died 22-Oct-2021 lexapro 20 qd not helping and wellbutrin made her feel crazy in the past has Rx prn xanax 0.25 qd prn She is not currently seeing therapy but I rec   She wants referral colonoscopy she is having change in bowel habits, due for pap and mammogram    -Pertinent past medical history: see below -Pertinent medication allergies: Allergies  Allergen Reactions   Wellbutrin [Bupropion]     Crazy    Meloxicam Nausea Only   -COVID-19 vaccine status:  Immunization History  Administered Date(s) Administered   Influenza,inj,Quad PF,6+ Mos 08/23/2014   Influenza-Unspecified 04/16/2019, 04/26/2021   Tdap 08/23/2014     ROS: See pertinent positives and negatives per HPI.  Past Medical History:  Diagnosis Date   Anxiety    C. difficile diarrhea    07/04/21   Chicken pox    Depression    Elevated testosterone level in female    Emphysema of lung (HCC)    bronchitis   Frequent headaches    Hay fever    Hypertension    Idiopathic thrombocytopenic purpura (ITP) (HCC)    Positive ANA (antinuclear antibody)     Past Surgical History:  Procedure Laterality Date   HEMATOMA EVACUATION N/A 04/07/2020   Procedure: EVACUATION HEMATOMA  AND APPLICATION OF PRESSURE DRESSING;  Surgeon: Benjaman Kindler, MD;  Location: [facility];  Service: Gynecology;  Laterality: N/A;   HYSTEROSCOPY WITH NOVASURE N/A 02/21/2018   Procedure: HYSTEROSCOPY WITH NOVASURE, POLYPECTOMY;  Surgeon: Benjaman Kindler, MD;   Location: [facility];  Service: Gynecology;  Laterality: N/A;   TUBAL LIGATION  1997   TUBAL LIGATION       Current Outpatient Medications:    acetaminophen (TYLENOL) 500 MG tablet, Take 500 mg by mouth every 6 (six) hours as needed., Disp: , Rfl:    albuterol (PROVENTIL) (2.5 MG/3ML) 0.083% nebulizer solution, Take 3 mLs (2.5 mg total) by nebulization every 6 (six) hours as needed for wheezing or shortness of breath., Disp: 360 mL, Rfl: 1   albuterol (VENTOLIN HFA) 108 (90 Base) MCG/ACT inhaler, INHALE 2 PUFFS INTO THE LUNGS EVERY 6 HOURS AS NEEDED FOR WHEEZING OR SHORTNESS OF BREATH (COUGH), Disp: 6.7 each, Rfl: 3   azelastine (OPTIVAR) 0.05 % ophthalmic solution, Place 1 drop into both eyes 2 (two) times daily., Disp: 6 mL, Rfl: 0   benzonatate (TESSALON) 100 MG capsule, Take 1 capsule (100 mg total) by mouth 3 (three) times daily as needed for cough., Disp: 30 capsule, Rfl: 0   Brexpiprazole (REXULTI) 0.5 MG TABS, Take 1 tablet (0.5 mg total) by mouth daily., Disp: 30 tablet, Rfl: 0   BREZTRI AEROSPHERE 160-9-4.8 MCG/ACT AERO, INHALE 2 PUFFS INTO THE LUNGS IN THE MORNING AND AT BEDTIME., Disp: 10.7 each, Rfl: 5   Cholecalciferol 1.25 MG (50000 UT) capsule, Take 1 capsule (50,000 Units total) by mouth every 30 (thirty) days. 1x per month note this, Disp: 13 capsule, Rfl: 0   cyanocobalamin (,VITAMIN B-12,) 1000 MCG/ML injection, INJECT 1 ML (1,000 MCG TOTAL) INTO THE MUSCLE EVERY  30 DAYS., Disp: 1 mL, Rfl: 15   cyclobenzaprine (FLEXERIL) 10 MG tablet, Take 1 tablet (10 mg total) by mouth 3 (three) times daily as needed for muscle spasms. Will cause drowsiness do not drive or operate machinery while using, Disp: 15 tablet, Rfl: 0   diclofenac Sodium (VOLTAREN) 1 % GEL, Apply 2-4 g topically 4 (four) times daily. 2 grams upper body qid prn and 4 gram lower body qid prn, Disp: 150 g, Rfl: 11   dicyclomine (BENTYL) 10 MG capsule, TAKE 1 CAPSULE (10 MG TOTAL) BY MOUTH 3 (THREE) TIMES DAILY AS NEEDED  FOR SPASMS. BEFORE FOOD, Disp: 90 capsule, Rfl: 11   escitalopram (LEXAPRO) 20 MG tablet, TAKE 1 TABLET BY MOUTH EVERY DAY, Disp: 90 tablet, Rfl: 4   fluticasone (FLONASE) 50 MCG/ACT nasal spray, SPRAY 1 SPRAY INTO BOTH NOSTRILS DAILY., Disp: 16 mL, Rfl: 2   ketorolac (TORADOL) 10 MG tablet, Take 1 tablet (10 mg total) by mouth every 6 (six) hours as needed., Disp: 20 tablet, Rfl: 0   ketorolac (TORADOL) 10 MG tablet, Take 1 tablet (10 mg total) by mouth every 6 (six) hours as needed., Disp: 20 tablet, Rfl: 0   levocetirizine (XYZAL) 5 MG tablet, Take 1 tablet (5 mg total) by mouth every evening., Disp: 30 tablet, Rfl: 0   meloxicam (MOBIC) 15 MG tablet, Take 1 tablet every day by oral route., Disp: , Rfl:    methylPREDNISolone (MEDROL DOSEPAK) 4 MG TBPK tablet, Take as directed in the package., Disp: 21 tablet, Rfl: 0   nicotine (NICOTROL) 10 MG inhaler, Inhale 1 Cartridge (1 continuous puffing total) into the lungs as needed for smoking cessation (No more than 16 cartridges per day)., Disp: 36 each, Rfl: 1   omeprazole (PRILOSEC) 20 MG capsule, TAKE 1 CAPSULE BY MOUTH EVERY DAY, Disp: 90 capsule, Rfl: 3   ondansetron (ZOFRAN) 4 MG tablet, TAKE 1 TABLET BY MOUTH EVERY 8 HOURS AS NEEDED FOR NAUSEA AND VOMITING, Disp: 10 tablet, Rfl: 11   Syringe/Needle, Disp, 25G X 1-1/2" 1 ML MISC, 1 Device by Does not apply route as directed., Disp: 30 each, Rfl: 11   traMADol (ULTRAM) 50 MG tablet, Take 1 tablet (50 mg total) by mouth every 12 (twelve) hours as needed., Disp: 60 tablet, Rfl: 0   ALPRAZolam (XANAX) 0.25 MG tablet, Take 1 tablet (0.25 mg total) by mouth daily as needed for anxiety., Disp: 30 tablet, Rfl: 2   amLODipine (NORVASC) 10 MG tablet, Take 1 tablet (10 mg total) by mouth daily., Disp: 90 tablet, Rfl: 3   carvedilol (COREG) 25 MG tablet, Take 1 tablet (25 mg total) by mouth 2 (two) times daily with a meal., Disp: 180 tablet, Rfl: 3   traZODone (DESYREL) 50 MG tablet, TAKE 0.5-1 TABLETS BY  MOUTH AT BEDTIME AS NEEDED FOR SLEEP., Disp: 90 tablet, Rfl: 3  EXAM:  VITALS per patient if applicable:  GENERAL: alert, oriented, appears well and in no acute distress  HEENT: atraumatic, conjunttiva clear, no obvious abnormalities on inspection of external nose and ears  PSYCH/NEURO: pleasant and cooperative, no obvious depression or anxiety, speech and thought processing grossly intact  ASSESSMENT AND PLAN:  Discussed the following assessment and plan: Anxiety and depression - Plan: ALPRAZolam (XANAX) 0.25 MG tablet Depression, recurrent (HCC) - Plan: Brexpiprazole (REXULTI) 0.5 MG TABS Insomnia, unspecified type - Plan: traZODone (DESYREL) 50 MG tablet exapro 20 qd not helping and wellbutrin made her feel crazy in the past has Rx prn xanax 0.25  qd prn She is not currently seeing therapy but I rec  Rec therapy and psych at [facility] call for an appt   Essential hypertension - Plan: amLODipine (NORVASC) 10 MG tablet, carvedilol (COREG) 25 MG tablet bid Pt no longer taking spironolactone 25 mg states BP is controlled but if needed can always add back 12.5 or 25 mg dose   Screening for colon cancer - Plan: Ambulatory referral to Gastroenterology Change in bowel habits - Plan: Ambulatory referral to Gastroenterology -->OF NOTE PT WILL NEED PULMONARY AND H/O CLEARANCE BEFORE COLONOSCOPY FOR H/O COPD AND CHRONIC ITP PLTS 42K  Cervical cancer screening - Plan: Ambulatory referral to Obstetrics / Gynecology Dr. Leafy Ro   B12 deficiency - Plan: Vitamin B12  Vitamin D deficiency - Plan: Vitamin D (25 hydroxy)  Fatigue, unspecified type - Plan: Comprehensive metabolic panel, TSH, Urinalysis, Routine w reflex microscopic, IBC + Ferritin Iron deficiency - Plan: IBC + Ferritin    HM Flu shot utd 2022  tdap utd due in 2026  covid 19 vaccine consider in future will need to be approved Dr. Grayland Ormond due to chronic ITP   Pap overdue sch Dr. Smitty Knudsen change to Dr. WArd s/p ablation  and cervical polyps  -she will call [facility] ob/gyn Dr. Leonides Schanz in [place] and get this scheduled due for pap  Sent referral   mammo 07/20/19  neg call and schedule informed again 03/21/22   Colonoscopy referred [facility] if not candidate will   consider in future vs cologaurd if too risky to do colonoscopy with chronic ITP with COPD currently and will need clearance by pulmonary and h/o Dr. Grayland Ormond   Skin referred prev [address] ? derm appt sch'ed or had    -we discussed possible serious and likely etiologies, options for evaluation and workup, limitations of telemedicine visit vs in person visit, treatment, treatment risks and precautions. Pt is agreeable to treatment via telemedicine at this moment.    I discussed the assessment and treatment plan with the patient. The patient was provided an opportunity to ask questions and all were answered. The patient agreed with the plan and demonstrated an understanding of the instructions.    Time spent 20 minutes Delorise Jackson, MD

## 2022-03-21 NOTE — Patient Instructions (Addendum)
Dr. Volanda Napoleon new PCP  For therapy and psychiatry call and make appt [facility] as given the info before for both  [facility] counseling and psychiatry [facility]  [place] [address] [phone]    [facility] [place] [facility] psychiat[facility] [place]  [address] [place] [address]  [phone]   Colonoscopy [facility]   Phone Fax E-mail Address  [phone] Not available [place] [address]     Specialties     Gastroenterology      Call and schedule mammogram  Address: [address], [place], [place] [address] Hours:  Open ? Closes 5?PM Phone: [phone]  Dr. Leafy Ro Address: [address], [place], [place] [address] Hours:  Open ? Closes 5?PM Phone: [phone]   Brexpiprazole Tablets What is this medication? BREXPIPRAZOLE (brex PIP ray zole) treats schizophrenia. It may also be used with antidepressant medication to treat depression. It can also be used to treat agitation caused by Alzheimer disease. It works by balancing the levels of dopamine and serotonin in your brain, substances that help regulate mood, behaviors, and thoughts. It belongs to a group of medications called antipsychotics. Antipsychotic medications can be used to treat several kinds of mental health conditions. This medicine may be used for other purposes; ask your health care provider or pharmacist if you have questions. COMMON BRAND NAME(S): REXULTI What should I tell my care team before I take this medication? They need to know if you have any of these conditions: Dementia Diabetes Difficulty swallowing Have trouble controlling your muscles Have urges you are unable to control (for example, gambling, spending money, or eating) Heart disease High cholesterol History of breast cancer History of stroke Kidney disease Liver disease Low blood counts, like low white cell, platelet, or red cell  counts Low blood pressure Parkinson's disease Seizures Suicidal thoughts, plans or attempt; a previous suicide attempt by you or a family member An unusual or allergic reaction to brexpiprazole, other medications, foods, dyes, or preservatives Pregnant or trying to get pregnant Breast-feeding How should I use this medication? Take this medication by mouth with water. Take it as directed on the prescription label at the same time every day. You can take it with or without food. If it upsets your stomach, take it with food. Keep taking this medication unless your care team tells you to stop. Stopping it too quickly can cause serious side effects. It can also make your condition worse. A special MedGuide will be given to you by the pharmacist with each prescription and refill. Be sure to read this information carefully each time. Talk to your care team about the use of this medication in children. While it may be prescribed for children as young as 13 years for selected conditions, precautions do apply. Overdosage: If you think you have taken too much of this medicine contact a poison control center or emergency room at once. NOTE: This medicine is only for you. Do not share this medicine with others. What if I miss a dose? If you miss a dose, take it as soon as you can. If it is almost time for your next dose, take only that dose. Do not take double or extra doses. What may interact with this medication? Do not take this medication with any of the following: Aripiprazole Metoclopramide This medication may also interact with the following: Antihistamines for allergy, cough, and cold Certain medications for  anxiety or sleep Certain medications for depression like amitriptyline, duloxetine, fluoxetine, paroxetine, sertraline Certain medications for fungal infections like fluconazole, itraconazole, ketoconazole Clarithromycin General anesthetics like halothane, isoflurane, methoxyflurane,  propofol Levodopa or other medications for Parkinson's disease Medications for blood pressure Medications that relax muscles for surgery Medications for seizures Narcotic medications for pain Phenothiazines like chlorpromazine, prochlorperazine, thioridazine Quinidine Rifampin St. John's Wort This list may not describe all possible interactions. Give your health care provider a list of all the medicines, herbs, non-prescription drugs, or dietary supplements you use. Also tell them if you smoke, drink alcohol, or use illegal drugs. Some items may interact with your medicine. What should I watch for while using this medication? Visit your care team for regular checks on your progress. Tell your care team if symptoms do not start to get better or if they get worse. Do not stop taking except on your care team's advice. You may develop a severe reaction. Your care team will tell you how much medication to take. Patients and their families should watch out for new or worsening depression or thoughts of suicide. Also watch out for sudden changes in feelings such as feeling anxious, agitated, panicky, irritable, hostile, aggressive, impulsive, severely restless, overly excited and hyperactive, or not being able to sleep. If this happens, especially at the beginning of antidepressant treatment or after a change in dose, call your care team. You may get dizzy or drowsy. Do not drive, use machinery, or do anything that needs mental alertness until you know how this medication affects you. Do not stand or sit up quickly, especially if you are an older patient. This reduces the risk of dizzy or fainting spells. Alcohol may interfere with the effect of this medication. Avoid alcoholic drinks. There have been reports of increased sexual urges or other strong urges such as gambling while taking this medication. If you experience any of these while taking this medication, you should report this to your care team as  soon as possible. This medication may cause dry eyes and blurred vision. If you wear contact lenses you may feel some discomfort. Lubricating drops may help. See your eye care team if the problem does not go away or is severe. This medication may increase blood sugar. Ask your care team if changes in diet or medications are needed if you have diabetes. This medication can cause problems with controlling your body temperature. It can lower the response of your body to cold temperatures. If possible, stay indoors during cold weather. If you must go outdoors, wear warm clothes. It can also lower the response of your body to heat. Do not overheat. Do not over-exercise. Stay out of the sun when possible. If you must be in the sun, wear cool clothing. Drink plenty of water. If you have trouble controlling your body temperature, call your care team right away. What side effects may I notice from receiving this medication? Side effects that you should report to your care team as soon as possible: Allergic reactions--skin rash, itching, hives, swelling of the face, lips, tongue, or throat High blood sugar (hyperglycemia)--increased thirst or amount of urine, unusual weakness or fatigue, blurry vision High fever, stiff muscles, increased sweating, fast or irregular heartbeat, and confusion, which may be signs of neuroleptic malignant syndrome Infection--fever, chills, cough, sore throat Low blood pressure--dizziness, feeling faint or lightheaded, blurry vision Pain or trouble swallowing Seizures Stroke--sudden numbness or weakness of the face, arm, or leg, trouble speaking, confusion, trouble walking, loss of balance  or coordination, dizziness, severe headache, change in vision Thoughts of suicide or self-harm, worsening mood, feelings of depression Uncontrolled and repetitive body movements, muscle stiffness or spasms, tremors or shaking, loss of balance or coordination, restlessness, shuffling walk, which may  be signs of extrapyramidal symptoms (EPS) Urges to engage in impulsive behaviors such as gambling, binge eating, sexual activity, or shopping in ways that are unusual for you Side effects that usually do not require medical attention (report to your care team if they continue or are bothersome): Constipation Drowsiness Headache Weight gain This list may not describe all possible side effects. Call your doctor for medical advice about side effects. You may report side effects to FDA at 1-800-FDA-1088. Where should I keep my medication? Keep out of the reach of children and pets. Store at room temperature between 20 and 25 degrees C (68 and 77 degrees F). Get rid of any unused medication after the expiration date. To get rid of medications that are no longer needed or have expired: Take the medication to a medication take-back program. Check with your pharmacy or law enforcement to find a location. If you cannot return the medication, check the label or package insert to see if the medication should be thrown out in the garbage or flushed down the toilet. If you are not sure, ask your care team. If it is safe to put it in the trash, take the medication out of the container. Mix the medication with cat litter, dirt, coffee grounds, or other unwanted substance. Seal the mixture in a bag or container. Put it in the trash. NOTE: This sheet is a summary. It may not cover all possible information. If you have questions about this medicine, talk to your doctor, pharmacist, or health care provider.  2023 Elsevier/Gold Standard (2014-02-05 00:00:00)

## 2022-03-21 NOTE — Progress Notes (Signed)
Subjective:    Patient ID: Carly [name], female    DOB: Mar 04, 1971, 51 y.o.   MRN: [id number] Patient Care Team: Carly [name], Carly Glow, MD as PCP - General (Internal Medicine) Carly Huger, MD as Consulting Physician (Oncology) Carly Pita, MD as Consulting Physician (Pulmonary Disease)  Chief Complaint  Patient presents with   Follow-up    HPI Carly [name] is a 51 year old current smoker (0.75 PPD, 75 PY) with issues as noted below, who presents for follow-up on the issue of asthma/COPD overlap syndrome.  He was switched to [facility] during her visit of 06 June 2021.  She was last seen here by Carly Edison, NP on 24 Nov 2021.  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  She has noted increased dry cough and occasional sputum production which is yellow.  She has had some increased shortness of breath over her baseline due to the fine particulates around the area where she resides related to hay harvesting.  She has not had any fevers, chills or sweats.  No orthopnea or paroxysmal nocturnal dyspnea.  No lower extremity edema.  No calf tenderness.  DATA 05/23/2021 PFTs: FEV1 1.60 L or 60% predicted, FVC 2.32 L or 69% predicted, FEV1/FVC 69%. Lung volumes show hyperinflation and air trapping there is a small airways component with improvement postbronchodilator.  Diffusion capacity is mildly reduced. 06/14/2021 CT Chest:mild emphysema, 3 mm right middle lobe nodule, 2 mm right upper lobe nodule mild diffuse groundglass opacity and micronodularity likely related to respiratory bronchiolitis/respiratory bronchiolitis interstitial lung disease  Review of Systems A 10 point review of systems was performed and it is as noted above otherwise negative.  Patient Active Problem List   Diagnosis Date Noted    Allergic rhinitis 11/24/2021   Stiff back 08/11/2021   Acute bronchitis with COPD (Carly [name]) 07/13/2021   Upper respiratory infection 07/13/2021   Lumbar radiculopathy 07/11/2021   Low back pain 07/11/2021   C. difficile diarrhea 07/04/2021   Gastroesophageal reflux disease without esophagitis 07/01/2020   Hyperlipidemia 07/01/2020   Posttraumatic vulvar hematoma 04/07/2020   Dizziness 03/29/2020   Fatty liver 12/24/2019   Angiolipoma of left kidney 12/24/2019   Chronic ITP (idiopathic thrombocytopenia) (Carly [name]) 12/24/2019   Chronic ITP (idiopathic thrombocytopenic purpura) (Carly [name]) 12/02/2019   Idiopathic thrombocytopenic purpura (ITP) (Carly [name]) 11/10/2019   Verruca vulgaris 09/29/2019   Plantar fasciitis, bilateral 09/29/2019   Osteoarthritis of both knees 09/29/2019   Overactive bladder 07/14/2019   B12 deficiency 06/10/2019   Vitamin D deficiency 06/10/2019   Headache 01/30/2019   Elevated LFTs 01/30/2019   Abnormal uterine bleeding (AUB) 08/26/2018   Depression, major, single episode, moderate (Carly [name]) 08/04/2018   Iron deficiency 09/02/2017   Menorrhagia with regular cycle 10/22/2016   Insomnia 08/15/2016   Nocturia 06/18/2016   Thrombocytopenia (Carly [name]) 02/12/2016   COPD exacerbation (Carly [name]) 01/31/2016   Carpal tunnel syndrome 01/20/2016   Lateral epicondylitis 01/20/2016   Obesity 01/04/2016   Palpitations 12/15/2015   Myalgia 10/31/2015   Fatigue 10/31/2015   Mixed incontinence 03/09/2015   COPD (chronic obstructive pulmonary disease) (Carly [name]) 01/13/2015   Acute bronchitis 11/15/2014   Female hirsutism 09/22/2014   Elevated testosterone level in female 09/22/2014   Generalized anxiety disorder 08/23/2014   Tobacco use disorder 08/23/2014   Obesity (BMI 30-39.9) 08/23/2014   HTN (hypertension) 08/23/2014  Social History   Tobacco Use   Smoking status: Every Day    Packs/day: 1.50    Years: 28.00    Total pack years: 42.00    Types: Cigarettes   Smokeless tobacco: Never    Tobacco comments:    0.75 PPD 11/24/2021  Substance Use Topics   Alcohol use: Yes    Alcohol/week: 8.0 standard drinks of alcohol    Types: 8 Standard drinks or equivalent per week    Comment: Beer (Can)    Allergies  Allergen Reactions   Meloxicam Nausea Only   Current Meds  Medication Sig   acetaminophen (TYLENOL) 500 MG tablet Take 500 mg by mouth every 6 (six) hours as needed.   albuterol (PROVENTIL) (2.5 MG/3ML) 0.083% nebulizer solution Take 3 mLs (2.5 mg total) by nebulization every 6 (six) hours as needed for wheezing or shortness of breath.   albuterol (VENTOLIN HFA) 108 (90 Base) MCG/ACT inhaler INHALE 2 PUFFS INTO THE LUNGS EVERY 6 HOURS AS NEEDED FOR WHEEZING OR SHORTNESS OF BREATH (COUGH)   ALPRAZolam (XANAX) 0.25 MG tablet Take 1 tablet (0.25 mg total) by mouth daily as needed for anxiety.   amLODipine (NORVASC) 10 MG tablet Take 1 tablet (10 mg total) by mouth daily.   azelastine (OPTIVAR) 0.05 % ophthalmic solution Place 1 drop into both eyes 2 (two) times daily.   benzonatate (TESSALON) 100 MG capsule Take 1 capsule (100 mg total) by mouth 3 (three) times daily as needed for cough.   BREZTRI AEROSPHERE 160-9-4.8 MCG/ACT AERO INHALE 2 PUFFS INTO THE LUNGS IN THE MORNING AND AT BEDTIME.   carvedilol (COREG) 25 MG tablet Take 1 tablet (25 mg total) by mouth 2 (two) times daily with a meal.   Cholecalciferol 1.25 MG (50000 UT) capsule Take 1 capsule (50,000 Units total) by mouth every 30 (thirty) days. 1x per month note this   cyanocobalamin (,VITAMIN B-12,) 1000 MCG/ML injection INJECT 1 ML (1,000 MCG TOTAL) INTO THE MUSCLE EVERY 30 DAYS.   cyclobenzaprine (FLEXERIL) 10 MG tablet Take 1 tablet (10 mg total) by mouth 3 (three) times daily as needed for muscle spasms. Will cause drowsiness do not drive or operate machinery while using   diclofenac Sodium (VOLTAREN) 1 % GEL Apply 2-4 g topically 4 (four) times daily. 2 grams upper body qid prn and 4 gram lower body qid prn    dicyclomine (BENTYL) 10 MG capsule TAKE 1 CAPSULE (10 MG TOTAL) BY MOUTH 3 (THREE) TIMES DAILY AS NEEDED FOR SPASMS. BEFORE FOOD   escitalopram (LEXAPRO) 20 MG tablet TAKE 1 TABLET BY MOUTH EVERY DAY   fluticasone (FLONASE) 50 MCG/ACT nasal spray SPRAY 1 SPRAY INTO BOTH NOSTRILS DAILY.   ketorolac (TORADOL) 10 MG tablet Take 1 tablet (10 mg total) by mouth every 6 (six) hours as needed.   ketorolac (TORADOL) 10 MG tablet Take 1 tablet (10 mg total) by mouth every 6 (six) hours as needed.   levocetirizine (XYZAL) 5 MG tablet Take 1 tablet (5 mg total) by mouth every evening.   meloxicam (MOBIC) 15 MG tablet Take 1 tablet every day by oral route.   methylPREDNISolone (MEDROL DOSEPAK) 4 MG TBPK tablet Take as directed in the package.   nicotine (NICOTROL) 10 MG inhaler Inhale 1 Cartridge (1 continuous puffing total) into the lungs as needed for smoking cessation (No more than 16 cartridges per day).   omeprazole (PRILOSEC) 20 MG capsule TAKE 1 CAPSULE BY MOUTH EVERY DAY   ondansetron (ZOFRAN) 4 MG tablet  TAKE 1 TABLET BY MOUTH EVERY 8 HOURS AS NEEDED FOR NAUSEA AND VOMITING   spironolactone (ALDACTONE) 25 MG tablet Take 1 tablet (25 mg total) by mouth daily. In am   Syringe/Needle, Disp, 25G X 1-1/2" 1 ML MISC 1 Device by Does not apply route as directed.   traMADol (ULTRAM) 50 MG tablet Take 1 tablet (50 mg total) by mouth every 12 (twelve) hours as needed.   traZODone (DESYREL) 50 MG tablet TAKE 0.5-1 TABLETS BY MOUTH AT BEDTIME AS NEEDED FOR SLEEP.   [DISCONTINUED] predniSONE (DELTASONE) 10 MG tablet Take 1 tablet (10 mg total) by mouth daily with breakfast. Prednisone 10 mg- 6 day dose pack as directed.   Immunization History  Administered Date(s) Administered   Influenza,inj,Quad PF,6+ Mos 08/23/2014   Influenza-Unspecified 04/16/2019, 04/26/2021   Tdap 08/23/2014        Objective:   Physical Exam BP 120/84 (BP Location: Left Arm, Patient Position: Sitting, Cuff Size: Normal)   Pulse  85   Temp 98.1 F (36.7 C) (Oral)   Ht 5' 2.5" (1.588 m)   Wt 197 lb 12.8 oz (89.7 kg)   SpO2 95%   BMI 35.60 kg/m  GENERAL: Awake, well-developed, no acute distress, fully ambulatory.  Overweight.  No conversational dyspnea. HEAD: Normocephalic, atraumatic.  EYES: Pupils equal, round, reactive to light.  No scleral icterus.  MOUTH: Nose/mouth/throat not examined due to masking requirements for COVID 19. NECK: Supple. No thyromegaly. Trachea midline. No JVD.  No adenopathy. PULMONARY: Good air entry bilaterally.  Coarse breath sounds bilaterally with faint end expiratory wheezes noted.   CARDIOVASCULAR: S1 and S2. Regular rate and rhythm.  No rubs, murmurs or gallops heard. ABDOMEN: Benign.   MUSCULOSKELETAL: No joint deformity, no clubbing, no edema.  NEUROLOGIC: No focal deficit, speech is fluent, no gait disturbance. SKIN: Intact,warm,dry.  Ecchymoses and petechiae upper extremities. PSYCH: Normal mood and behavior.       Assessment & Plan:     ICD-10-CM   1. Asthma-COPD overlap syndrome  J44.9    Continue Breztri 2 puffs twice a day Continue as needed albuterol Medrol Dosepak for mild flare Check hypersensitivity pneumonitis panel and CBC    2. Respiratory bronchiolitis associated interstitial lung disease (Carly [name])  J84.115 CBC w/Diff    Hypersensitivity Pneumonitis    CANCELED: Hypersensitivity Pneumonitis    CANCELED: CBC w/Diff   We will check hypersensitivity pneumonitis panel Smoking cessation is key    3. Chronic ITP (idiopathic thrombocytopenia) (Carly [name])  D69.3    This issue adds complexity to her management Followed by hematology    4. Tobacco dependence due to cigarettes  F17.210    Patient was counseled regards to discontinuation of smoking Counseling time 3 to 5 minutes     Orders Placed This Encounter  Procedures   CBC w/Diff    Standing Status:   Future    Number of Occurrences:   1    Standing Expiration Date:   09/19/2022   Hypersensitivity Pneumonitis     Standing Status:   Future    Number of Occurrences:   1    Standing Expiration Date:   09/19/2022   Meds ordered this encounter  Medications   methylPREDNISolone (MEDROL DOSEPAK) 4 MG TBPK tablet    Sig: Take as directed in the package.    Dispense:  21 tablet    Refill:  0   We will see the patient in follow-up in 6 to 8 weeks time she is to contact us prior  to that time should any new difficulties arise.  Renold Don, MD Advanced [facility]ch[facility]    *This note was dictated using voice recognition software/Dragon.  Despite best efforts to proofread, errors can occur which can change the meaning. Any transcriptional errors that result from this process are unintentional and may not be fully corrected at the time of dictation.

## 2022-03-21 NOTE — Patient Instructions (Signed)
We are getting some blood work to evaluate for sensitivities in your lung due to the hay they have been baling around in your area.  There is a condition called farmers lung that can be aggravated by molds in the hay that settle in your lung.  We sent a prescription for Medrol to your pharmacy.   She will follow-up in 6 to 8 weeks time call sooner should any new problems arise.

## 2022-03-26 ENCOUNTER — Encounter: Payer: Self-pay | Admitting: Internal Medicine

## 2022-03-26 ENCOUNTER — Other Ambulatory Visit: Payer: BC Managed Care – PPO

## 2022-03-26 ENCOUNTER — Other Ambulatory Visit: Payer: Self-pay | Admitting: Internal Medicine

## 2022-03-26 DIAGNOSIS — F330 Major depressive disorder, recurrent, mild: Secondary | ICD-10-CM

## 2022-03-26 LAB — HYPERSENSITIVITY PNEUMONITIS
A. Pullulans Abs: NEGATIVE
A.Fumigatus #1 Abs: NEGATIVE
Micropolyspora faeni, IgG: NEGATIVE
Pigeon Serum Abs: NEGATIVE
Thermoact. Saccharii: NEGATIVE
Thermoactinomyces vulgaris, IgG: NEGATIVE

## 2022-03-26 MED ORDER — ARIPIPRAZOLE 2 MG PO TABS: 2.0000 mg | ORAL_TABLET | Freq: Every day | ORAL | 3 refills | Status: DC

## 2022-04-03 ENCOUNTER — Other Ambulatory Visit: Payer: Self-pay

## 2022-04-03 ENCOUNTER — Inpatient Hospital Stay: Payer: BC Managed Care – PPO

## 2022-04-03 ENCOUNTER — Inpatient Hospital Stay: Payer: BC Managed Care – PPO | Attending: Oncology

## 2022-04-03 DIAGNOSIS — D693 Immune thrombocytopenic purpura: Secondary | ICD-10-CM

## 2022-04-03 LAB — CBC WITH DIFFERENTIAL/PLATELET
Abs Immature Granulocytes: 0.11 10*3/uL — ABNORMAL HIGH (ref 0.00–0.07)
Basophils Absolute: 0 10*3/uL (ref 0.0–0.1)
Basophils Relative: 0 %
Eosinophils Absolute: 0.2 10*3/uL (ref 0.0–0.5)
Eosinophils Relative: 3 %
HCT: 47.1 % — ABNORMAL HIGH (ref 36.0–46.0)
Hemoglobin: 16.1 g/dL — ABNORMAL HIGH (ref 12.0–15.0)
Immature Granulocytes: 1 %
Lymphocytes Relative: 34 %
Lymphs Abs: 3.2 10*3/uL (ref 0.7–4.0)
MCH: 34.1 pg — ABNORMAL HIGH (ref 26.0–34.0)
MCHC: 34.2 g/dL (ref 30.0–36.0)
MCV: 99.8 fL (ref 80.0–100.0)
Monocytes Absolute: 0.8 10*3/uL (ref 0.1–1.0)
Monocytes Relative: 8 %
Neutro Abs: 5.1 10*3/uL (ref 1.7–7.7)
Neutrophils Relative %: 54 %
Platelets: 38 10*3/uL — ABNORMAL LOW (ref 150–400)
RBC: 4.72 MIL/uL (ref 3.87–5.11)
RDW: 12.9 % (ref 11.5–15.5)
WBC: 9.4 10*3/uL (ref 4.0–10.5)
nRBC: 0 % (ref 0.0–0.2)

## 2022-04-03 MED ORDER — ROMIPLOSTIM INJECTION 500 MCG
5.0000 ug/kg | Freq: Once | SUBCUTANEOUS | Status: AC
  Administered 2022-04-03: 445 ug via SUBCUTANEOUS
  Filled 2022-04-03: qty 0.89

## 2022-04-09 ENCOUNTER — Other Ambulatory Visit: Payer: BC Managed Care – PPO

## 2022-04-10 ENCOUNTER — Encounter: Payer: Self-pay | Admitting: Pulmonary Disease

## 2022-04-10 ENCOUNTER — Encounter: Payer: Self-pay | Admitting: Internal Medicine

## 2022-04-10 NOTE — Telephone Encounter (Signed)
Pt called stating she would like to be called when her FMLA paperwork is ready

## 2022-04-10 NOTE — Telephone Encounter (Signed)
Usually FMLA paperwork his filled out by primary.

## 2022-04-10 NOTE — Telephone Encounter (Signed)
Dr. Gonzalez, please advise. Thanks 

## 2022-04-11 ENCOUNTER — Telehealth: Payer: Self-pay

## 2022-04-11 NOTE — Telephone Encounter (Signed)
Spoke with pt in regards to her FMLA forms being completed and faxed to the given facx number: [phone] with a confirmed trans log.  Pt was informed her copy is available for pick up and I have placed the forms in a yellow envelope in the designated area.

## 2022-04-19 ENCOUNTER — Encounter: Payer: Self-pay | Admitting: Podiatry

## 2022-04-19 ENCOUNTER — Ambulatory Visit (INDEPENDENT_AMBULATORY_CARE_PROVIDER_SITE_OTHER): Payer: BC Managed Care – PPO | Admitting: Podiatry

## 2022-04-19 ENCOUNTER — Encounter: Payer: Self-pay | Admitting: Pulmonary Disease

## 2022-04-19 DIAGNOSIS — M722 Plantar fascial fibromatosis: Secondary | ICD-10-CM | POA: Diagnosis not present

## 2022-04-19 MED ORDER — ACETAMINOPHEN-CODEINE 300-30 MG PO TABS: 1.0000 | ORAL_TABLET | ORAL | 0 refills | Status: DC | PRN

## 2022-04-19 MED ORDER — METHYLPREDNISOLONE 4 MG PO TBPK: ORAL_TABLET | ORAL | 0 refills | Status: DC

## 2022-04-19 NOTE — Progress Notes (Signed)
Subjective:  Patient ID: Carly [name], female    DOB: 1971/05/14,  MRN: [id number]  Chief Complaint  Patient presents with   Plantar Fasciitis    51 y.o. female presents with the above complaint.  Patient presents for follow-up of left Planter fasciitis.  She states the left side started to act up again.  She was doing really good but came out of nowhere has been ambulating hurting and has progressive gotten worse.  Denies any other acute complaints.   Review of Systems: Negative except as noted in the HPI. Denies N/V/F/Ch.  Past Medical History:  Diagnosis Date   Anxiety    C. difficile diarrhea    07/04/21   Chicken pox    Depression    Elevated testosterone level in female    Emphysema of lung (HCC)    bronchitis   Frequent headaches    Hay fever    Hypertension    Idiopathic thrombocytopenic purpura (ITP) (HCC)    Positive ANA (antinuclear antibody)     Current Outpatient Medications:    acetaminophen (TYLENOL) 500 MG tablet, Take 500 mg by mouth every 6 (six) hours as needed., Disp: , Rfl:    albuterol (PROVENTIL) (2.5 MG/3ML) 0.083% nebulizer solution, Take 3 mLs (2.5 mg total) by nebulization every 6 (six) hours as needed for wheezing or shortness of breath., Disp: 360 mL, Rfl: 1   albuterol (VENTOLIN HFA) 108 (90 Base) MCG/ACT inhaler, INHALE 2 PUFFS INTO THE LUNGS EVERY 6 HOURS AS NEEDED FOR WHEEZING OR SHORTNESS OF BREATH (COUGH), Disp: 6.7 each, Rfl: 3   ALPRAZolam (XANAX) 0.25 MG tablet, Take 1 tablet (0.25 mg total) by mouth daily as needed for anxiety., Disp: 30 tablet, Rfl: 2   amLODipine (NORVASC) 10 MG tablet, Take 1 tablet (10 mg total) by mouth daily., Disp: 90 tablet, Rfl: 3   ARIPiprazole (ABILIFY) 2 MG tablet, Take 1 tablet (2 mg total) by mouth daily. Dc rexulti not covered, Disp: 90 tablet, Rfl: 3   azelastine (OPTIVAR) 0.05 % ophthalmic solution, Place 1 drop into both eyes 2 (two) times daily., Disp: 6 mL, Rfl: 0   benzonatate (TESSALON) 100 MG  capsule, Take 1 capsule (100 mg total) by mouth 3 (three) times daily as needed for cough., Disp: 30 capsule, Rfl: 0   BREZTRI AEROSPHERE 160-9-4.8 MCG/ACT AERO, INHALE 2 PUFFS INTO THE LUNGS IN THE MORNING AND AT BEDTIME., Disp: 10.7 each, Rfl: 5   carvedilol (COREG) 25 MG tablet, Take 1 tablet (25 mg total) by mouth 2 (two) times daily with a meal., Disp: 180 tablet, Rfl: 3   Cholecalciferol 1.25 MG (50000 UT) capsule, Take 1 capsule (50,000 Units total) by mouth every 30 (thirty) days. 1x per month note this, Disp: 13 capsule, Rfl: 0   cyanocobalamin (,VITAMIN B-12,) 1000 MCG/ML injection, INJECT 1 ML (1,000 MCG TOTAL) INTO THE MUSCLE EVERY 30 DAYS., Disp: 1 mL, Rfl: 15   cyclobenzaprine (FLEXERIL) 10 MG tablet, Take 1 tablet (10 mg total) by mouth 3 (three) times daily as needed for muscle spasms. Will cause drowsiness do not drive or operate machinery while using, Disp: 15 tablet, Rfl: 0   diclofenac Sodium (VOLTAREN) 1 % GEL, Apply 2-4 g topically 4 (four) times daily. 2 grams upper body qid prn and 4 gram lower body qid prn, Disp: 150 g, Rfl: 11   dicyclomine (BENTYL) 10 MG capsule, TAKE 1 CAPSULE (10 MG TOTAL) BY MOUTH 3 (THREE) TIMES DAILY AS NEEDED FOR SPASMS. BEFORE FOOD, Disp: 90  capsule, Rfl: 11   escitalopram (LEXAPRO) 20 MG tablet, TAKE 1 TABLET BY MOUTH EVERY DAY, Disp: 90 tablet, Rfl: 4   fluticasone (FLONASE) 50 MCG/ACT nasal spray, SPRAY 1 SPRAY INTO BOTH NOSTRILS DAILY., Disp: 16 mL, Rfl: 2   ketorolac (TORADOL) 10 MG tablet, Take 1 tablet (10 mg total) by mouth every 6 (six) hours as needed., Disp: 20 tablet, Rfl: 0   ketorolac (TORADOL) 10 MG tablet, Take 1 tablet (10 mg total) by mouth every 6 (six) hours as needed., Disp: 20 tablet, Rfl: 0   levocetirizine (XYZAL) 5 MG tablet, Take 1 tablet (5 mg total) by mouth every evening., Disp: 30 tablet, Rfl: 0   meloxicam (MOBIC) 15 MG tablet, Take 1 tablet every day by oral route., Disp: , Rfl:    methylPREDNISolone (MEDROL DOSEPAK) 4  MG TBPK tablet, Take as directed in the package., Disp: 21 tablet, Rfl: 0   nicotine (NICOTROL) 10 MG inhaler, Inhale 1 Cartridge (1 continuous puffing total) into the lungs as needed for smoking cessation (No more than 16 cartridges per day)., Disp: 36 each, Rfl: 1   omeprazole (PRILOSEC) 20 MG capsule, TAKE 1 CAPSULE BY MOUTH EVERY DAY, Disp: 90 capsule, Rfl: 3   ondansetron (ZOFRAN) 4 MG tablet, TAKE 1 TABLET BY MOUTH EVERY 8 HOURS AS NEEDED FOR NAUSEA AND VOMITING, Disp: 10 tablet, Rfl: 11   Syringe/Needle, Disp, 25G X 1-1/2" 1 ML MISC, 1 Device by Does not apply route as directed., Disp: 30 each, Rfl: 11   traMADol (ULTRAM) 50 MG tablet, Take 1 tablet (50 mg total) by mouth every 12 (twelve) hours as needed., Disp: 60 tablet, Rfl: 0   traZODone (DESYREL) 50 MG tablet, TAKE 0.5-1 TABLETS BY MOUTH AT BEDTIME AS NEEDED FOR SLEEP., Disp: 90 tablet, Rfl: 3  Social History   Tobacco Use  Smoking Status Every Day   Packs/day: 1.50   Years: 28.00   Total pack years: 42.00   Types: Cigarettes  Smokeless Tobacco Never  Tobacco Comments   1.5 ppd increase d/t death of sister.  04-18-23 hfb    Allergies  Allergen Reactions   Wellbutrin [Bupropion]     Crazy    Meloxicam Nausea Only   Objective:  There were no vitals filed for this visit. There is no height or weight on file to calculate BMI. Constitutional Well developed. Well nourished.  Vascular Dorsalis pedis pulses palpable bilaterally. Posterior tibial pulses palpable bilaterally. Capillary refill normal to all digits.  No cyanosis or clubbing noted. Pedal hair growth normal.  Neurologic Normal speech. Oriented to person, place, and time. Epicritic sensation to light touch grossly present bilaterally.  Dermatologic Nails well groomed and normal in appearance. No open wounds. No skin lesions.  Orthopedic: Normal joint ROM without pain or crepitus bilaterally. No visible deformities. Tender to palpation at the calcaneal tuber  left No pain with calcaneal squeeze bilaterally. Ankle ROM diminished range of motion bilaterally. Silfverskiold Test: positive bilaterally.   Radiographs: Taken and reviewed. No acute fractures or dislocations. No evidence of stress fracture.  Plantar heel spur present. Posterior heel spur absent.   Assessment:   No diagnosis found.    Plan:  Patient was evaluated and treated and all questions answered.  Plantar Fasciitis, left~recurrence - XR reviewed as above.  - Educated on icing and stretching. Instructions given.  -injection delivered to the plantar fascia as below. - DME: None - Pharmacologic management: None  Pes planovalgus -I explained to patient the etiology of pes planovalgus  and relationship with Planter fasciitis and various treatment options were discussed.  Given patient foot structure in the setting of Planter fasciitis I believe patient will benefit from custom-made orthotics to help control the hindfoot motion support the arch of the foot and take the stress away from plantar fascial.  Patient agrees with the plan like to proceed with orthotics -Orthotics were dispensed and are functioning well   Procedure: Injection Tendon/Ligament Location: Left plantar fascia at the glabrous junction; medial approach. Skin Prep: alcohol Injectate: 0.5 cc 0.5% marcaine plain, 0.5 cc of 1% Lidocaine, 0.5 cc kenalog 10. Disposition: Patient tolerated procedure well. Injection site dressed with a band-aid.  No follow-ups on file.

## 2022-04-19 NOTE — Telephone Encounter (Signed)
See tele note from 04/11/22

## 2022-04-30 ENCOUNTER — Telehealth: Payer: BC Managed Care – PPO | Admitting: Emergency Medicine

## 2022-04-30 DIAGNOSIS — J019 Acute sinusitis, unspecified: Secondary | ICD-10-CM | POA: Diagnosis not present

## 2022-04-30 MED ORDER — AMOXICILLIN-POT CLAVULANATE 875-125 MG PO TABS: 1.0000 | ORAL_TABLET | Freq: Two times a day (BID) | ORAL | 0 refills | Status: DC

## 2022-04-30 NOTE — Patient Instructions (Signed)
You can take (2) '500mg'$  tablets of Tylenol every 6 hours.  Don't double up on medications that have Tylenol in them (acetaminophen), which is common in over-the-counter cough and could medications.  Take antibiotics as directed.

## 2022-04-30 NOTE — Progress Notes (Signed)
Virtual Visit Consent   Carly [name], you are scheduled for a virtual visit with a [facility] provider today. Just as with appointments in the office, your consent must be obtained to participate. Your consent will be active for this visit and any virtual visit you may have with one of our providers in the next 365 days. If you have a MyChart account, a copy of this consent can be sent to you electronically.  As this is a virtual visit, video technology does not allow for your provider to perform a traditional examination. This may limit your provider's ability to fully assess your condition. If your provider identifies any concerns that need to be evaluated in person or the need to arrange testing (such as labs, EKG, etc.), we will make arrangements to do so. Although advances in technology are sophisticated, we cannot ensure that it will always work on either your end or our end. If the connection with a video visit is poor, the visit may have to be switched to a telephone visit. With either a video or telephone visit, we are not always able to ensure that we have a secure connection.  By engaging in this virtual visit, you consent to the provision of healthcare and authorize for your insurance to be billed (if applicable) for the services provided during this visit. Depending on your insurance coverage, you may receive a charge related to this service.  I need to obtain your verbal consent now. Are you willing to proceed with your visit today? Carly [name] has provided verbal consent on 04/30/2022 for a virtual visit (video or telephone). Montine Circle, PA-C  Date: 04/30/2022 5:01 PM  Virtual Visit via Video Note   I, Montine Circle, [name]  ([id number], 04/17/1971) on 04/30/22 at  5:00 PM EDT by a video-enabled telemedicine application and verified that I am speaking with the correct person using two identifiers.  Location: Patient: Virtual Visit Location Patient:  Home Provider: Virtual Visit Location Provider: Home   I discussed the limitations of evaluation and management by telemedicine and the availability of in person appointments. The patient expressed understanding and agreed to proceed.    Switched from video to telephone due to connectivity issues.  History of Present Illness: Carly [name] is a 51 y.o. who identifies as a female who was assigned female at birth, and is being seen today for headache.  States that she has had it for the past 3 days.  States that she has had symptoms similar to this with COVID, but states that her temperature has been normal.  Reports associated body aches.  States that she has pressure in her sinus.  HPI: HPI  Problems:  Patient Active Problem List   Diagnosis Date Noted   Depression, recurrent ([place]) 03/21/2022   Anxiety and depression 03/21/2022   Grief 03/21/2022   Allergic rhinitis 11/24/2021   Stiff back 08/11/2021   Acute bronchitis with COPD ([place]) 07/13/2021   Upper respiratory infection 07/13/2021   Lumbar radiculopathy 07/11/2021   Low back pain 07/11/2021   C. difficile diarrhea 07/04/2021   Gastroesophageal reflux disease without esophagitis 07/01/2020   Hyperlipidemia 07/01/2020   Posttraumatic vulvar hematoma 04/07/2020   Dizziness 03/29/2020   Fatty liver 12/24/2019   Angiolipoma of left kidney 12/24/2019   Chronic ITP (idiopathic thrombocytopenia) (HCC) 12/24/2019   Chronic ITP (idiopathic thrombocytopenic purpura) ([place]) 12/02/2019   Idiopathic thrombocytopenic purpura (ITP) ([place]) 11/10/2019   Verruca vulgaris 09/29/2019  Plantar fasciitis, bilateral 09/29/2019   Osteoarthritis of both knees 09/29/2019   Overactive bladder 07/14/2019   B12 deficiency 06/10/2019   Vitamin D deficiency 06/10/2019   Headache 01/30/2019   Elevated LFTs 01/30/2019   Abnormal uterine bleeding (AUB) 08/26/2018   Depression, major, single episode, moderate ([place]) 08/04/2018   Iron deficiency 09/02/2017    Menorrhagia with regular cycle 10/22/2016   Insomnia 08/15/2016   Nocturia 06/18/2016   Thrombocytopenia ([place]) 02/12/2016   COPD exacerbation ([place]) 01/31/2016   Carpal tunnel syndrome 01/20/2016   Lateral epicondylitis 01/20/2016   Obesity 01/04/2016   Palpitations 12/15/2015   Myalgia 10/31/2015   Fatigue 10/31/2015   Mixed incontinence 03/09/2015   COPD (chronic obstructive pulmonary disease) ([place]) 01/13/2015   Acute bronchitis 11/15/2014   Female hirsutism 09/22/2014   Elevated testosterone level in female 09/22/2014   Generalized anxiety disorder 08/23/2014   Tobacco use disorder 08/23/2014   Obesity (BMI 30-39.9) 08/23/2014   HTN (hypertension) 08/23/2014    Allergies:  Allergies  Allergen Reactions   Wellbutrin [Bupropion]     Crazy    Meloxicam Nausea Only   Medications:  Current Outpatient Medications:    acetaminophen (TYLENOL) 500 MG tablet, Take 500 mg by mouth every 6 (six) hours as needed., Disp: , Rfl:    acetaminophen-codeine (TYLENOL #3) 300-30 MG tablet, Take 1-2 tablets by mouth every 4 (four) hours as needed for moderate pain., Disp: 30 tablet, Rfl: 0   albuterol (PROVENTIL) (2.5 MG/3ML) 0.083% nebulizer solution, Take 3 mLs (2.5 mg total) by nebulization every 6 (six) hours as needed for wheezing or shortness of breath., Disp: 360 mL, Rfl: 1   albuterol (VENTOLIN HFA) 108 (90 Base) MCG/ACT inhaler, INHALE 2 PUFFS INTO THE LUNGS EVERY 6 HOURS AS NEEDED FOR WHEEZING OR SHORTNESS OF BREATH (COUGH), Disp: 6.7 each, Rfl: 3   ALPRAZolam (XANAX) 0.25 MG tablet, Take 1 tablet (0.25 mg total) by mouth daily as needed for anxiety., Disp: 30 tablet, Rfl: 2   amLODipine (NORVASC) 10 MG tablet, Take 1 tablet (10 mg total) by mouth daily., Disp: 90 tablet, Rfl: 3   ARIPiprazole (ABILIFY) 2 MG tablet, Take 1 tablet (2 mg total) by mouth daily. Dc rexulti not covered, Disp: 90 tablet, Rfl: 3   azelastine (OPTIVAR) 0.05 % ophthalmic solution, Place 1 drop into both eyes 2  (two) times daily., Disp: 6 mL, Rfl: 0   benzonatate (TESSALON) 100 MG capsule, Take 1 capsule (100 mg total) by mouth 3 (three) times daily as needed for cough., Disp: 30 capsule, Rfl: 0   BREZTRI AEROSPHERE 160-9-4.8 MCG/ACT AERO, INHALE 2 PUFFS INTO THE LUNGS IN THE MORNING AND AT BEDTIME., Disp: 10.7 each, Rfl: 5   carvedilol (COREG) 25 MG tablet, Take 1 tablet (25 mg total) by mouth 2 (two) times daily with a meal., Disp: 180 tablet, Rfl: 3   Cholecalciferol 1.25 MG (50000 UT) capsule, Take 1 capsule (50,000 Units total) by mouth every 30 (thirty) days. 1x per month note this, Disp: 13 capsule, Rfl: 0   cyanocobalamin (,VITAMIN B-12,) 1000 MCG/ML injection, INJECT 1 ML (1,000 MCG TOTAL) INTO THE MUSCLE EVERY 30 DAYS., Disp: 1 mL, Rfl: 15   cyclobenzaprine (FLEXERIL) 10 MG tablet, Take 1 tablet (10 mg total) by mouth 3 (three) times daily as needed for muscle spasms. Will cause drowsiness do not drive or operate machinery while using, Disp: 15 tablet, Rfl: 0   diclofenac Sodium (VOLTAREN) 1 % GEL, Apply 2-4 g topically 4 (four) times daily. 2  grams upper body qid prn and 4 gram lower body qid prn, Disp: 150 g, Rfl: 11   dicyclomine (BENTYL) 10 MG capsule, TAKE 1 CAPSULE (10 MG TOTAL) BY MOUTH 3 (THREE) TIMES DAILY AS NEEDED FOR SPASMS. BEFORE FOOD, Disp: 90 capsule, Rfl: 11   escitalopram (LEXAPRO) 20 MG tablet, TAKE 1 TABLET BY MOUTH EVERY DAY, Disp: 90 tablet, Rfl: 4   fluticasone (FLONASE) 50 MCG/ACT nasal spray, SPRAY 1 SPRAY INTO BOTH NOSTRILS DAILY., Disp: 16 mL, Rfl: 2   ketorolac (TORADOL) 10 MG tablet, Take 1 tablet (10 mg total) by mouth every 6 (six) hours as needed., Disp: 20 tablet, Rfl: 0   ketorolac (TORADOL) 10 MG tablet, Take 1 tablet (10 mg total) by mouth every 6 (six) hours as needed., Disp: 20 tablet, Rfl: 0   levocetirizine (XYZAL) 5 MG tablet, Take 1 tablet (5 mg total) by mouth every evening., Disp: 30 tablet, Rfl: 0   meloxicam (MOBIC) 15 MG tablet, Take 1 tablet every day  by oral route., Disp: , Rfl:    methylPREDNISolone (MEDROL DOSEPAK) 4 MG TBPK tablet, Take as directed in the package., Disp: 21 tablet, Rfl: 0   methylPREDNISolone (MEDROL DOSEPAK) 4 MG TBPK tablet, Take as directed, Disp: 21 each, Rfl: 0   nicotine (NICOTROL) 10 MG inhaler, Inhale 1 Cartridge (1 continuous puffing total) into the lungs as needed for smoking cessation (No more than 16 cartridges per day)., Disp: 36 each, Rfl: 1   omeprazole (PRILOSEC) 20 MG capsule, TAKE 1 CAPSULE BY MOUTH EVERY DAY, Disp: 90 capsule, Rfl: 3   ondansetron (ZOFRAN) 4 MG tablet, TAKE 1 TABLET BY MOUTH EVERY 8 HOURS AS NEEDED FOR NAUSEA AND VOMITING, Disp: 10 tablet, Rfl: 11   Syringe/Needle, Disp, 25G X 1-1/2" 1 ML MISC, 1 Device by Does not apply route as directed., Disp: 30 each, Rfl: 11   traMADol (ULTRAM) 50 MG tablet, Take 1 tablet (50 mg total) by mouth every 12 (twelve) hours as needed., Disp: 60 tablet, Rfl: 0   traZODone (DESYREL) 50 MG tablet, TAKE 0.5-1 TABLETS BY MOUTH AT BEDTIME AS NEEDED FOR SLEEP., Disp: 90 tablet, Rfl: 3  Observations/Objective: Patient is well-developed, well-nourished in no acute distress.  Resting comfortably  at home.  Head is normocephalic, atraumatic.  No labored breathing.  Speech is clear and coherent with logical content.  Patient is alert and oriented at baseline.    Assessment and Plan: 1. Acute sinusitis, recurrence not specified, unspecified location  - Headache symptoms sound like sinus headache.  Will trial abx therapy. COVID negative. - PCP follow-up.  Meds ordered this encounter  Medications   amoxicillin-clavulanate (AUGMENTIN) 875-125 MG tablet    Sig: Take 1 tablet by mouth every 12 (twelve) hours.    Dispense:  14 tablet    Refill:  0    Order Specific Question:   Supervising Provider    Answer:   Chase Picket [id number]     Follow Up Instructions: I discussed the assessment and treatment plan with the patient. The patient was provided an  opportunity to ask questions and all were answered. The patient agreed with the plan and demonstrated an understanding of the instructions.  A copy of instructions were sent to the patient via MyChart unless otherwise noted below.     The patient was advised to call back or seek an in-person evaluation if the symptoms worsen or if the condition fails to improve as anticipated.  Time:  I spent 10 minutes with  the patient via telehealth technology discussing the above problems/concerns.    Montine Circle, PA-C

## 2022-05-08 ENCOUNTER — Ambulatory Visit (INDEPENDENT_AMBULATORY_CARE_PROVIDER_SITE_OTHER): Payer: BC Managed Care – PPO | Admitting: Podiatry

## 2022-05-08 DIAGNOSIS — M722 Plantar fascial fibromatosis: Secondary | ICD-10-CM | POA: Diagnosis not present

## 2022-05-15 ENCOUNTER — Inpatient Hospital Stay (HOSPITAL_BASED_OUTPATIENT_CLINIC_OR_DEPARTMENT_OTHER): Payer: BC Managed Care – PPO | Admitting: Nurse Practitioner

## 2022-05-15 ENCOUNTER — Encounter: Payer: Self-pay | Admitting: Nurse Practitioner

## 2022-05-15 ENCOUNTER — Inpatient Hospital Stay: Payer: BC Managed Care – PPO | Attending: Oncology

## 2022-05-15 ENCOUNTER — Ambulatory Visit: Payer: BC Managed Care – PPO | Admitting: Oncology

## 2022-05-15 ENCOUNTER — Inpatient Hospital Stay: Payer: BC Managed Care – PPO

## 2022-05-15 VITALS — BP 145/77 | HR 88 | Temp 97.8°F | Resp 18 | Ht 62.5 in | Wt 206.0 lb

## 2022-05-15 DIAGNOSIS — D693 Immune thrombocytopenic purpura: Secondary | ICD-10-CM

## 2022-05-15 LAB — CBC WITH DIFFERENTIAL/PLATELET
Abs Immature Granulocytes: 0.03 10*3/uL (ref 0.00–0.07)
Basophils Absolute: 0 10*3/uL (ref 0.0–0.1)
Basophils Relative: 1 %
Eosinophils Absolute: 0.3 10*3/uL (ref 0.0–0.5)
Eosinophils Relative: 4 %
HCT: 46 % (ref 36.0–46.0)
Hemoglobin: 15.6 g/dL — ABNORMAL HIGH (ref 12.0–15.0)
Immature Granulocytes: 0 %
Lymphocytes Relative: 31 %
Lymphs Abs: 2.5 10*3/uL (ref 0.7–4.0)
MCH: 34.5 pg — ABNORMAL HIGH (ref 26.0–34.0)
MCHC: 33.9 g/dL (ref 30.0–36.0)
MCV: 101.8 fL — ABNORMAL HIGH (ref 80.0–100.0)
Monocytes Absolute: 0.5 10*3/uL (ref 0.1–1.0)
Monocytes Relative: 7 %
Neutro Abs: 4.6 10*3/uL (ref 1.7–7.7)
Neutrophils Relative %: 57 %
Platelets: 50 10*3/uL — ABNORMAL LOW (ref 150–400)
RBC: 4.52 MIL/uL (ref 3.87–5.11)
RDW: 12.9 % (ref 11.5–15.5)
WBC: 7.9 10*3/uL (ref 4.0–10.5)
nRBC: 0 % (ref 0.0–0.2)

## 2022-05-15 MED ORDER — ROMIPLOSTIM INJECTION 500 MCG
6.0000 ug/kg | Freq: Once | SUBCUTANEOUS | Status: AC
  Administered 2022-05-15: 560 ug via SUBCUTANEOUS
  Filled 2022-05-15: qty 1.12

## 2022-05-15 NOTE — Progress Notes (Signed)
[facility]  [phone] [phone]  ID: Lin Landsman OB: Nov 28, 1970  MR#: [id number]  LXB#:[id number]  Patient Care Team: McLean-Scocuzza, Nino Glow, MD as PCP - General (Internal Medicine) Lloyd Huger, MD as Consulting Physician (Oncology) Tyler Pita, MD as Consulting Physician (Pulmonary Disease)   CHIEF COMPLAINT: ITP  INTERVAL HISTORY: Patient returns to clinic today for repeat laboratory work, further evaluation, and continuation of Nplate. She denies bruising. Feels well. May require surgery on left foot for nerve entrapment. Is still in walking cast. Denies abnormal bleeding. She has no neurologic complaints.  She denies any recent fevers or illnesses.  She has a good appetite and denies weight loss.  She denies any chest pain, shortness of breath, cough, or hemoptysis. She denies any nausea, vomiting, constipation, or diarrhea.  She has no urinary complaints.  Patient offers no further specific complaints today.  REVIEW OF SYSTEMS:   Review of Systems  Constitutional: Negative.  Negative for fever, malaise/fatigue and weight loss.  Respiratory: Negative.  Negative for cough, hemoptysis and shortness of breath.   Cardiovascular: Negative.  Negative for chest pain and leg swelling.  Gastrointestinal: Negative.  Negative for abdominal pain, blood in stool and melena.  Genitourinary: Negative.  Negative for hematuria.  Musculoskeletal: Negative.  Negative for back pain.  Skin: Negative.  Negative for rash.  Neurological: Negative.  Negative for dizziness, focal weakness, weakness and headaches.  Endo/Heme/Allergies: Negative.  Does not bruise/bleed easily.  Psychiatric/Behavioral: Negative.  The patient is not nervous/anxious.   As per HPI. Otherwise, a complete review of systems is negative.  PAST MEDICAL HISTORY: Past Medical History:  Diagnosis Date   Anxiety    C. difficile diarrhea    07/04/21   Chicken pox     Depression    Elevated testosterone level in female    Emphysema of lung (HCC)    bronchitis   Frequent headaches    Hay fever    Hypertension    Idiopathic thrombocytopenic purpura (ITP) (HCC)    Positive ANA (antinuclear antibody)     PAST SURGICAL HISTORY: Past Surgical History:  Procedure Laterality Date   HEMATOMA EVACUATION N/A 04/07/2020   Procedure: EVACUATION HEMATOMA  AND APPLICATION OF PRESSURE DRESSING;  Surgeon: Benjaman Kindler, MD;  Location: [facility] ORS;  Service: Gynecology;  Laterality: N/A;   HYSTEROSCOPY WITH NOVASURE N/A 02/21/2018   Procedure: HYSTEROSCOPY WITH NOVASURE, POLYPECTOMY;  Surgeon: Benjaman Kindler, MD;  Location: [facility] ORS;  Service: Gynecology;  Laterality: N/A;   TUBAL LIGATION  1997   TUBAL LIGATION      FAMILY HISTORY: Family History  Problem Relation Age of Onset   Hyperlipidemia Mother    Heart disease Mother    Stroke Mother    Depression Mother    Mental illness Mother    Heart attack Mother 31   Hyperlipidemia Father    Depression Father    Mental illness Father    Diabetes Paternal Grandfather    Cancer Cousin     ADVANCED DIRECTIVES (Y/N):  N  HEALTH MAINTENANCE: Social History   Tobacco Use   Smoking status: Every Day    Packs/day: 1.50    Years: 28.00    Total pack years: 42.00    Types: Cigarettes   Smokeless tobacco: Never   Tobacco comments:    1.5 ppd increase d/t death of sister.  03/29/23 hfb  Vaping Use   Vaping Use: Never used  Substance Use Topics   Alcohol use: Yes  Alcohol/week: 8.0 standard drinks of alcohol    Types: 8 Standard drinks or equivalent per week    Comment: Beer (Can)    Drug use: No    Comment: CBD oil    Colonoscopy:  PAP:  Bone density:  Lipid panel:  Allergies  Allergen Reactions   Wellbutrin [Bupropion]     Crazy    Meloxicam Nausea Only    Current Outpatient Medications  Medication Sig Dispense Refill   acetaminophen (TYLENOL) 500 MG tablet Take 500 mg by mouth every 6  (six) hours as needed.     acetaminophen-codeine (TYLENOL #3) 300-30 MG tablet Take 1-2 tablets by mouth every 4 (four) hours as needed for moderate pain. 30 tablet 0   albuterol (PROVENTIL) (2.5 MG/3ML) 0.083% nebulizer solution Take 3 mLs (2.5 mg total) by nebulization every 6 (six) hours as needed for wheezing or shortness of breath. 360 mL 1   albuterol (VENTOLIN HFA) 108 (90 Base) MCG/ACT inhaler INHALE 2 PUFFS INTO THE LUNGS EVERY 6 HOURS AS NEEDED FOR WHEEZING OR SHORTNESS OF BREATH (COUGH) 6.7 each 3   ALPRAZolam (XANAX) 0.25 MG tablet Take 1 tablet (0.25 mg total) by mouth daily as needed for anxiety. 30 tablet 2   amLODipine (NORVASC) 10 MG tablet Take 1 tablet (10 mg total) by mouth daily. 90 tablet 3   ARIPiprazole (ABILIFY) 2 MG tablet Take 1 tablet (2 mg total) by mouth daily. Dc rexulti not covered 90 tablet 3   azelastine (OPTIVAR) 0.05 % ophthalmic solution Place 1 drop into both eyes 2 (two) times daily. 6 mL 0   BREZTRI AEROSPHERE 160-9-4.8 MCG/ACT AERO INHALE 2 PUFFS INTO THE LUNGS IN THE MORNING AND AT BEDTIME. 10.7 each 5   carvedilol (COREG) 25 MG tablet Take 1 tablet (25 mg total) by mouth 2 (two) times daily with a meal. 180 tablet 3   Cholecalciferol 1.25 MG (50000 UT) capsule Take 1 capsule (50,000 Units total) by mouth every 30 (thirty) days. 1x per month note this 13 capsule 0   cyanocobalamin (,VITAMIN B-12,) 1000 MCG/ML injection INJECT 1 ML (1,000 MCG TOTAL) INTO THE MUSCLE EVERY 30 DAYS. 1 mL 15   diclofenac Sodium (VOLTAREN) 1 % GEL Apply 2-4 g topically 4 (four) times daily. 2 grams upper body qid prn and 4 gram lower body qid prn 150 g 11   dicyclomine (BENTYL) 10 MG capsule TAKE 1 CAPSULE (10 MG TOTAL) BY MOUTH 3 (THREE) TIMES DAILY AS NEEDED FOR SPASMS. BEFORE FOOD 90 capsule 11   escitalopram (LEXAPRO) 20 MG tablet TAKE 1 TABLET BY MOUTH EVERY DAY 90 tablet 4   fluticasone (FLONASE) 50 MCG/ACT nasal spray SPRAY 1 SPRAY INTO BOTH NOSTRILS DAILY. 16 mL 2    levocetirizine (XYZAL) 5 MG tablet Take 1 tablet (5 mg total) by mouth every evening. 30 tablet 0   meloxicam (MOBIC) 15 MG tablet Take 1 tablet every day by oral route.     nicotine (NICOTROL) 10 MG inhaler Inhale 1 Cartridge (1 continuous puffing total) into the lungs as needed for smoking cessation (No more than 16 cartridges per day). 36 each 1   omeprazole (PRILOSEC) 20 MG capsule TAKE 1 CAPSULE BY MOUTH EVERY DAY 90 capsule 3   Syringe/Needle, Disp, 25G X 1-1/2" 1 ML MISC 1 Device by Does not apply route as directed. 30 each 11   traZODone (DESYREL) 50 MG tablet TAKE 0.5-1 TABLETS BY MOUTH AT BEDTIME AS NEEDED FOR SLEEP. 90 tablet 3   amoxicillin-clavulanate (AUGMENTIN)  875-125 MG tablet Take 1 tablet by mouth every 12 (twelve) hours. (Patient not taking: Reported on 05/15/2022) 14 tablet 0   benzonatate (TESSALON) 100 MG capsule Take 1 capsule (100 mg total) by mouth 3 (three) times daily as needed for cough. (Patient not taking: Reported on 05/15/2022) 30 capsule 0   cyclobenzaprine (FLEXERIL) 10 MG tablet Take 1 tablet (10 mg total) by mouth 3 (three) times daily as needed for muscle spasms. Will cause drowsiness do not drive or operate machinery while using (Patient not taking: Reported on 05/15/2022) 15 tablet 0   ketorolac (TORADOL) 10 MG tablet Take 1 tablet (10 mg total) by mouth every 6 (six) hours as needed. (Patient not taking: Reported on 05/15/2022) 20 tablet 0   ketorolac (TORADOL) 10 MG tablet Take 1 tablet (10 mg total) by mouth every 6 (six) hours as needed. (Patient not taking: Reported on 05/15/2022) 20 tablet 0   methylPREDNISolone (MEDROL DOSEPAK) 4 MG TBPK tablet Take as directed in the package. (Patient not taking: Reported on 05/15/2022) 21 tablet 0   methylPREDNISolone (MEDROL DOSEPAK) 4 MG TBPK tablet Take as directed (Patient not taking: Reported on 05/15/2022) 21 each 0   ondansetron (ZOFRAN) 4 MG tablet TAKE 1 TABLET BY MOUTH EVERY 8 HOURS AS NEEDED FOR NAUSEA AND  VOMITING (Patient not taking: Reported on 05/15/2022) 10 tablet 11   traMADol (ULTRAM) 50 MG tablet Take 1 tablet (50 mg total) by mouth every 12 (twelve) hours as needed. (Patient not taking: Reported on 05/15/2022) 60 tablet 0   No current facility-administered medications for this visit.    OBJECTIVE: Vitals:   05/15/22 1356  BP: (!) 145/77  Pulse: 88  Resp: 18  Temp: 97.8 F (36.6 C)  SpO2: 98%     Body mass index is 37.08 kg/m.    ECOG FS:0 - Asymptomatic  General: Well-developed, well-nourished, no acute distress. Eyes: Pink conjunctiva, anicteric sclera. HEENT: Normocephalic, moist mucous membranes. Lungs: No audible wheezing or coughing. Heart: Regular rate and rhythm. Abdomen: Soft, nontender, no obvious distention. Musculoskeletal: No edema, cyanosis, or clubbing. Walking cast left foot.  Neuro: Alert, answering all questions appropriately. Cranial nerves grossly intact. Skin: No rashes or petechiae noted.  Mild bruising on upper extremities. Psych: Normal affect.   LAB RESULTS:  Lab Results  Component Value Date   NA 139 05/04/2021   K 3.8 05/04/2021   CL 101 05/04/2021   CO2 22 05/04/2021   GLUCOSE 75 05/04/2021   BUN 8 05/04/2021   CREATININE 0.61 05/04/2021   CALCIUM 9.1 05/04/2021   PROT 7.0 05/04/2021   ALBUMIN 4.5 05/04/2021   AST 41 (H) 05/04/2021   ALT 30 05/04/2021   ALKPHOS 96 05/04/2021   BILITOT 0.6 05/04/2021   GFRNONAA >60 04/08/2021   GFRAA >60 04/08/2020    Lab Results  Component Value Date   WBC 7.9 05/15/2022   NEUTROABS 4.6 05/15/2022   HGB 15.6 (H) 05/15/2022   HCT 46.0 05/15/2022   MCV 101.8 (H) 05/15/2022   PLT 50 (L) 05/15/2022    STUDIES: No results found.  ASSESSMENT: ITP  PLAN:    1.  ITP: Confirmed with normal bone marrow biopsy and normal FISH and cytogenetics on July 22, 2019.  Patient had a positive ANA which is likely clinically insignificant.  The remainder of her laboratory work was either negative or  within normal limits. CT scan on March 04, 2017 did not reveal splenomegaly.  Previously, patient received 1 week of prednisone at 1  mg/kg dose and had no appreciable change in her platelet count.  Rituxan was denied by insurance.  Previously, patient received 3 mcg/kg of Nplate with her platelet count increasing from 33-287.  Patient's platelet count is essentially stable at 50 today. Increase to 6 mcg/kg Nplate today (previously 5 mcg/kg). Plan to rtc in 6 weeks for labs, Nplate and then in 12 weeks for labs, further evaluation with Dr. Grayland Ormond, and consideration of treatment. Goal is to achieve less frequent injections.   2.  History of positive ANA: Likely clinically insignificant.  Referral was previously sent to rheumatology.  Disposition:  N plate today 6 weeks- lab, Nplate 12 weeks- lab, Dr. Grayland Ormond, Nplate- la  I spent a total of 20 minutes reviewing chart data, face-to-face evaluation with the patient, counseling and coordination of care as detailed above.  Patient expressed understanding and was in agreement with this plan. She also understands that She can call clinic at any time with any questions, concerns, or complaints.   Verlon Au, NP   05/15/2022

## 2022-05-15 NOTE — Progress Notes (Signed)
Subjective:  Patient ID: Carly [name], female    DOB: 18-Feb-1971,  MRN: [id number]  Chief Complaint  Patient presents with   Plantar Fasciitis    51 y.o. female presents with the above complaint.  Patient presents for follow-up of left Planter fasciitis.  She states the injection did not help.  She would like to discuss next treatment plan.  Still has some discomfort.   Review of Systems: Negative except as noted in the HPI. Denies N/V/F/Ch.  Past Medical History:  Diagnosis Date   Anxiety    C. difficile diarrhea    07/04/21   Chicken pox    Depression    Elevated testosterone level in female    Emphysema of lung (HCC)    bronchitis   Frequent headaches    Hay fever    Hypertension    Idiopathic thrombocytopenic purpura (ITP) (HCC)    Positive ANA (antinuclear antibody)     Current Outpatient Medications:    acetaminophen (TYLENOL) 500 MG tablet, Take 500 mg by mouth every 6 (six) hours as needed., Disp: , Rfl:    acetaminophen-codeine (TYLENOL #3) 300-30 MG tablet, Take 1-2 tablets by mouth every 4 (four) hours as needed for moderate pain., Disp: 30 tablet, Rfl: 0   albuterol (PROVENTIL) (2.5 MG/3ML) 0.083% nebulizer solution, Take 3 mLs (2.5 mg total) by nebulization every 6 (six) hours as needed for wheezing or shortness of breath., Disp: 360 mL, Rfl: 1   albuterol (VENTOLIN HFA) 108 (90 Base) MCG/ACT inhaler, INHALE 2 PUFFS INTO THE LUNGS EVERY 6 HOURS AS NEEDED FOR WHEEZING OR SHORTNESS OF BREATH (COUGH), Disp: 6.7 each, Rfl: 3   ALPRAZolam (XANAX) 0.25 MG tablet, Take 1 tablet (0.25 mg total) by mouth daily as needed for anxiety., Disp: 30 tablet, Rfl: 2   amLODipine (NORVASC) 10 MG tablet, Take 1 tablet (10 mg total) by mouth daily., Disp: 90 tablet, Rfl: 3   amoxicillin-clavulanate (AUGMENTIN) 875-125 MG tablet, Take 1 tablet by mouth every 12 (twelve) hours., Disp: 14 tablet, Rfl: 0   ARIPiprazole (ABILIFY) 2 MG tablet, Take 1 tablet (2 mg total) by mouth daily. Dc  rexulti not covered, Disp: 90 tablet, Rfl: 3   azelastine (OPTIVAR) 0.05 % ophthalmic solution, Place 1 drop into both eyes 2 (two) times daily., Disp: 6 mL, Rfl: 0   benzonatate (TESSALON) 100 MG capsule, Take 1 capsule (100 mg total) by mouth 3 (three) times daily as needed for cough., Disp: 30 capsule, Rfl: 0   BREZTRI AEROSPHERE 160-9-4.8 MCG/ACT AERO, INHALE 2 PUFFS INTO THE LUNGS IN THE MORNING AND AT BEDTIME., Disp: 10.7 each, Rfl: 5   carvedilol (COREG) 25 MG tablet, Take 1 tablet (25 mg total) by mouth 2 (two) times daily with a meal., Disp: 180 tablet, Rfl: 3   Cholecalciferol 1.25 MG (50000 UT) capsule, Take 1 capsule (50,000 Units total) by mouth every 30 (thirty) days. 1x per month note this, Disp: 13 capsule, Rfl: 0   cyanocobalamin (,VITAMIN B-12,) 1000 MCG/ML injection, INJECT 1 ML (1,000 MCG TOTAL) INTO THE MUSCLE EVERY 30 DAYS., Disp: 1 mL, Rfl: 15   cyclobenzaprine (FLEXERIL) 10 MG tablet, Take 1 tablet (10 mg total) by mouth 3 (three) times daily as needed for muscle spasms. Will cause drowsiness do not drive or operate machinery while using, Disp: 15 tablet, Rfl: 0   diclofenac Sodium (VOLTAREN) 1 % GEL, Apply 2-4 g topically 4 (four) times daily. 2 grams upper body qid prn and 4 gram lower body qid  prn, Disp: 150 g, Rfl: 11   dicyclomine (BENTYL) 10 MG capsule, TAKE 1 CAPSULE (10 MG TOTAL) BY MOUTH 3 (THREE) TIMES DAILY AS NEEDED FOR SPASMS. BEFORE FOOD, Disp: 90 capsule, Rfl: 11   escitalopram (LEXAPRO) 20 MG tablet, TAKE 1 TABLET BY MOUTH EVERY DAY, Disp: 90 tablet, Rfl: 4   fluticasone (FLONASE) 50 MCG/ACT nasal spray, SPRAY 1 SPRAY INTO BOTH NOSTRILS DAILY., Disp: 16 mL, Rfl: 2   ketorolac (TORADOL) 10 MG tablet, Take 1 tablet (10 mg total) by mouth every 6 (six) hours as needed., Disp: 20 tablet, Rfl: 0   ketorolac (TORADOL) 10 MG tablet, Take 1 tablet (10 mg total) by mouth every 6 (six) hours as needed., Disp: 20 tablet, Rfl: 0   levocetirizine (XYZAL) 5 MG tablet, Take 1  tablet (5 mg total) by mouth every evening., Disp: 30 tablet, Rfl: 0   meloxicam (MOBIC) 15 MG tablet, Take 1 tablet every day by oral route., Disp: , Rfl:    methylPREDNISolone (MEDROL DOSEPAK) 4 MG TBPK tablet, Take as directed in the package., Disp: 21 tablet, Rfl: 0   methylPREDNISolone (MEDROL DOSEPAK) 4 MG TBPK tablet, Take as directed, Disp: 21 each, Rfl: 0   nicotine (NICOTROL) 10 MG inhaler, Inhale 1 Cartridge (1 continuous puffing total) into the lungs as needed for smoking cessation (No more than 16 cartridges per day)., Disp: 36 each, Rfl: 1   omeprazole (PRILOSEC) 20 MG capsule, TAKE 1 CAPSULE BY MOUTH EVERY DAY, Disp: 90 capsule, Rfl: 3   ondansetron (ZOFRAN) 4 MG tablet, TAKE 1 TABLET BY MOUTH EVERY 8 HOURS AS NEEDED FOR NAUSEA AND VOMITING, Disp: 10 tablet, Rfl: 11   Syringe/Needle, Disp, 25G X 1-1/2" 1 ML MISC, 1 Device by Does not apply route as directed., Disp: 30 each, Rfl: 11   traMADol (ULTRAM) 50 MG tablet, Take 1 tablet (50 mg total) by mouth every 12 (twelve) hours as needed., Disp: 60 tablet, Rfl: 0   traZODone (DESYREL) 50 MG tablet, TAKE 0.5-1 TABLETS BY MOUTH AT BEDTIME AS NEEDED FOR SLEEP., Disp: 90 tablet, Rfl: 3  Social History   Tobacco Use  Smoking Status Every Day   Packs/day: 1.50   Years: 28.00   Total pack years: 42.00   Types: Cigarettes  Smokeless Tobacco Never  Tobacco Comments   1.5 ppd increase d/t death of sister.  April 16, 2023 hfb    Allergies  Allergen Reactions   Wellbutrin [Bupropion]     Crazy    Meloxicam Nausea Only   Objective:  There were no vitals filed for this visit. There is no height or weight on file to calculate BMI. Constitutional Well developed. Well nourished.  Vascular Dorsalis pedis pulses palpable bilaterally. Posterior tibial pulses palpable bilaterally. Capillary refill normal to all digits.  No cyanosis or clubbing noted. Pedal hair growth normal.  Neurologic Normal speech. Oriented to person, place, and  time. Epicritic sensation to light touch grossly present bilaterally.  Dermatologic Nails well groomed and normal in appearance. No open wounds. No skin lesions.  Orthopedic: Normal joint ROM without pain or crepitus bilaterally. No visible deformities. Tender to palpation at the calcaneal tuber left No pain with calcaneal squeeze bilaterally. Ankle ROM diminished range of motion bilaterally. Silfverskiold Test: positive bilaterally.   Radiographs: Taken and reviewed. No acute fractures or dislocations. No evidence of stress fracture.  Plantar heel spur present. Posterior heel spur absent.   Assessment:   No diagnosis found.    Plan:  Patient was evaluated and treated  and all questions answered.  Plantar Fasciitis, left~recurrence - XR reviewed as above.  - Educated on icing and stretching. Instructions given.  -We will hold off on injection as they have not helped - DME: Cam boot was dispensed.  Will undergo a cam boot immobilization - Pharmacologic management: None  Pes planovalgus -I explained to patient the etiology of pes planovalgus and relationship with Planter fasciitis and various treatment options were discussed.  Given patient foot structure in the setting of Planter fasciitis I believe patient will benefit from custom-made orthotics to help control the hindfoot motion support the arch of the foot and take the stress away from plantar fascial.  Patient agrees with the plan like to proceed with orthotics -Orthotics were dispensed and are functioning well    No follow-ups on file.

## 2022-05-17 ENCOUNTER — Encounter: Payer: Self-pay | Admitting: Family Medicine

## 2022-05-17 NOTE — Telephone Encounter (Signed)
Attempted to call Patient to schedule her -no answer and voicemail has not been set up

## 2022-05-22 NOTE — Telephone Encounter (Signed)
Pt stated that she is dealing with mucus like diarrhea that is greasy in appearance. It is painful & leaves her feeling bloated.Pt scheduled Thursday at 10 to see Dr. Volanda Napoleon.

## 2022-05-24 ENCOUNTER — Encounter: Payer: Self-pay | Admitting: Pulmonary Disease

## 2022-05-24 ENCOUNTER — Ambulatory Visit (INDEPENDENT_AMBULATORY_CARE_PROVIDER_SITE_OTHER): Payer: BC Managed Care – PPO | Admitting: Pulmonary Disease

## 2022-05-24 ENCOUNTER — Encounter: Payer: Self-pay | Admitting: Family Medicine

## 2022-05-24 ENCOUNTER — Ambulatory Visit
Admission: RE | Admit: 2022-05-24 | Discharge: 2022-05-24 | Disposition: A | Discharge status: Home / Self Care | Payer: BC Managed Care – PPO | Source: Ambulatory Visit | Attending: Pulmonary Disease | Admitting: Pulmonary Disease

## 2022-05-24 ENCOUNTER — Ambulatory Visit (INDEPENDENT_AMBULATORY_CARE_PROVIDER_SITE_OTHER): Payer: BC Managed Care – PPO | Admitting: Family Medicine

## 2022-05-24 VITALS — BP 124/80 | HR 84 | Temp 98.1°F | Ht 62.0 in | Wt 205.6 lb

## 2022-05-24 VITALS — BP 114/72 | HR 92 | Temp 98.1°F | Ht 62.0 in | Wt 207.4 lb

## 2022-05-24 DIAGNOSIS — J84115 Respiratory bronchiolitis interstitial lung disease: Secondary | ICD-10-CM | POA: Diagnosis not present

## 2022-05-24 DIAGNOSIS — J4489 Other specified chronic obstructive pulmonary disease: Secondary | ICD-10-CM | POA: Diagnosis not present

## 2022-05-24 DIAGNOSIS — R197 Diarrhea, unspecified: Secondary | ICD-10-CM

## 2022-05-24 DIAGNOSIS — R0609 Other forms of dyspnea: Secondary | ICD-10-CM | POA: Insufficient documentation

## 2022-05-24 DIAGNOSIS — F17210 Nicotine dependence, cigarettes, uncomplicated: Secondary | ICD-10-CM

## 2022-05-24 DIAGNOSIS — R0683 Snoring: Secondary | ICD-10-CM | POA: Diagnosis not present

## 2022-05-24 DIAGNOSIS — D693 Immune thrombocytopenic purpura: Secondary | ICD-10-CM

## 2022-05-24 HISTORY — DX: Other forms of dyspnea: R06.09

## 2022-05-24 LAB — COMPREHENSIVE METABOLIC PANEL
ALT: 34 U/L (ref 0–35)
AST: 55 U/L — ABNORMAL HIGH (ref 0–37)
Albumin: 3.9 g/dL (ref 3.5–5.2)
Alkaline Phosphatase: 78 U/L (ref 39–117)
BUN: 7 mg/dL (ref 6–23)
CO2: 28 mEq/L (ref 19–32)
Calcium: 8.6 mg/dL (ref 8.4–10.5)
Chloride: 102 mEq/L (ref 96–112)
Creatinine, Ser: 0.53 mg/dL (ref 0.40–1.20)
GFR: 107.52 mL/min (ref >60.00)
Glucose, Bld: 99 mg/dL (ref 70–99)
Potassium: 4 mEq/L (ref 3.5–5.1)
Sodium: 138 mEq/L (ref 135–145)
Total Bilirubin: 0.5 mg/dL (ref 0.2–1.2)
Total Protein: 6.2 g/dL (ref 6.0–8.3)

## 2022-05-24 LAB — MAGNESIUM: Magnesium: 1.6 mg/dL (ref 1.5–2.5)

## 2022-05-24 LAB — NITRIC OXIDE: Nitric Oxide: 5

## 2022-05-24 NOTE — Patient Instructions (Signed)
We are going to order a repeat breathing test.  You will also have a heart test that will allow [place] to see the state of your heart.  We have also entered a chest x-ray order so you can get a chest x-ray done today.  We will be enrolled in the lung cancer screening program.  We are scheduling you for a home sleep test.  Continue using your Breztri 2 puffs twice a day.  Continue as needed albuterol.  We will see you in follow-up in 6 to 8 weeks time call sooner should any new problems arise.

## 2022-05-24 NOTE — Patient Instructions (Addendum)
It was a pleasure meeting you today. Thank you for allowing me to take part in your health care.  Our goals for today as we discussed include:  For your diarrhea This could be from the recent antibiotics you had received or the Abilify that you recently started.   Collect stool samples and bring to [facility] lab Will hold off on treatment until results received Stop Dicyclomine for now Continue to hydrate BRAT diet  We will get some labs today.  If they are abnormal or we need to do something about them, I will call you.  If they are normal, I will send you a message on MyChart (if it is active) or a letter in the mail.  If you don't hear from [place] in 2 weeks, please call the office at the number below.   If you develop any fevers, abdominal pain, bloody stool notify MD or go to the Urgent care for evaluation.  MyChart me in 2 days to let me know how you are doing  Follow up with GI doctor as scheduled  Please follow-up with PCP in 1 week  If you have any questions or concerns, please do not hesitate to call the office at [phone].  I look forward to our next visit and until then take care and stay safe.  Regards,   Carollee Leitz, MD   [employer] Choices to Help Relieve Diarrhea, Adult Diarrhea can make you feel weak and cause you to become dehydrated. Dehydration is a condition in which there is not enough water or other fluids in the body. It is important to choose the right foods and drinks to: Relieve diarrhea. Replace lost fluids and nutrients. Prevent dehydration. What are tips for following this plan? Relieving diarrhea Avoid foods that make your diarrhea worse. These may include: Foods and drinks that are sweetened with high-fructose corn syrup, honey, or sweeteners such as xylitol, sorbitol, and mannitol. Check food labels for these ingredients. Fried, greasy, or spicy foods. Raw fruits and vegetables. Eat foods that are rich in probiotics. These  include foods such as yogurt and fermented milk products. Probiotics can help increase healthy bacteria in your stomach and intestines (gastrointestinal or GI tract). This may help digestion and stop diarrhea. If you have lactose intolerance, avoid dairy products. These may make your diarrhea worse. Take medicine to help stop diarrhea only as told by your health care provider. Replacing nutrients  Eat bland, easy-to-digest foods in small amounts as you are able, until your diarrhea starts to get better. These foods include bananas, applesauce, rice, toast, and crackers. Over time, add nutrient-rich foods as your body tolerates them or as told by your health care provider. These include: Well-cooked protein foods, such as eggs, lean meats like fish or chicken without skin, and tofu. Peeled, seeded, and soft-cooked fruits and vegetables. Low-fat dairy products. Whole grains. Take vitamin and mineral supplements as told by your health care provider. Preventing dehydration  Start by sipping water or a solution to prevent dehydration (oral rehydration solution, or ORS). This is a drink that helps replace fluids and minerals your body has lost. You can buy an ORS at pharmacies and retail stores. Try to drink at least 8-10 cups (2,000-2,500 mL) of fluid each day to help replace lost fluids. If your urine is pale yellow, you are getting enough fluids. You may drink other liquids in addition to water, such as fruit juice that you have added water to (diluted fruit  juice) or low-calorie sports drinks, as tolerated or as told by your health care provider. Avoid drinks with caffeine, such as coffee, tea, or soft drinks. Avoid alcohol. This information is not intended to replace advice given to you by your health care provider. Make sure you discuss any questions you have with your health care provider. Document Revised: 12/19/2021 Document Reviewed: 12/19/2021 Elsevier Patient Education  [place].

## 2022-05-24 NOTE — Progress Notes (Signed)
   SUBJECTIVE:   Chief Complaint  Patient presents with   Acute Visit    Mucous like Diarrhea with Pain & Bloating for 3 weeks   HPI  Diarrhea: Patient complains of diarrhea. Onset of diarrhea was  3 weeks ago . Diarrhea is occurring approximately  8-9  times per day. Patient describes diarrhea as mucous-laden, watery, and greasy . Diarrhea has been associated with abdominal pain described as cramping, last 1/2 day, improves with BM and recent antibiotic therapy 1 month ago for sinusitis.  Patient denies blood in stool, fever, illness in household contacts, recent travel, significant abdominal pain, unintentional weight loss.  Previous visits for diarrhea: none. Evaluation to date: none. Treatment to date: None   PERTINENT PMH / PSH: Acute Sinusitis Chronic ITP MDD   OBJECTIVE:  BP 114/72 (BP Location: Left Arm, Patient Position: Sitting, Cuff Size: Normal)   Pulse 92   Temp 98.1 F (36.7 C) (Oral)   Ht '5\' 2"'$  (1.575 m)   Wt 207 lb 6.4 oz (94.1 kg)   SpO2 99%   BMI 37.93 kg/m    Physical Exam Vitals reviewed.  Constitutional:      General: She is not in acute distress.    Appearance: She is not ill-appearing.  Cardiovascular:     Rate and Rhythm: Normal rate.  Pulmonary:     Effort: Pulmonary effort is normal.  Abdominal:     General: Bowel sounds are normal. There is no distension or abdominal bruit.     Palpations: Abdomen is soft. There is no fluid wave, hepatomegaly or pulsatile mass.     Tenderness: There is no abdominal tenderness. There is no right CVA tenderness, left CVA tenderness, guarding or rebound. Negative signs include Murphy's sign and McBurney's sign.     Hernia: No hernia is present.  Skin:    Coloration: Skin is not jaundiced.  Neurological:     Mental Status: She is alert. Mental status is at baseline.  Psychiatric:        Mood and Affect: Mood normal.        Behavior: Behavior normal.        Thought Content: Thought content normal.         Judgment: Judgment normal.     ASSESSMENT/PLAN:  Diarrhea, unspecified type Assessment & Plan: 3 weeks of nonbloody mucousy diarrhea with bloating and abdominal cramping that improves with bowel movements.  Suspect IBS however given recent antibiotic use and history of C. difficile will send stool to rule out any infectious etiology and obtain labs to ensure no electrolyte abnormalities.  Reports having GI appointment scheduled for December 23. Strict return precautions provided  Orders: -     Comprehensive metabolic panel -     C Difficile Quick Screen w PCR reflex; Future -     Stool culture; Future -     Ova and parasite examination; Future -     Magnesium   PDMP reviewed  Return in about 1 week (around 05/31/2022).  Carollee Leitz, MD

## 2022-05-24 NOTE — Progress Notes (Signed)
Subjective:    Patient ID: Carly [name], female    DOB: 18-Sep-1970, 51 y.o.   MRN: [id number] Patient Care Team: McLean-Scocuzza, Pasty Spillers, MD as PCP - General (Internal Medicine) Jeralyn Ruths, MD as Consulting Physician (Oncology) Salena Saner, MD as Consulting Physician (Pulmonary Disease)  Chief Complaint  Patient presents with   Follow-up    SOB and wheezing with exertion. Dry cough when she lays down.    HPI Carly [name] is a 51 year old current smoker (0.5 PPD, 42 PY) with issues as noted below, who presents for follow-up on the issue of asthma/COPD overlap syndrome.  He was switched to [facility] during her visit of 06 June 2021.  She was last seen here by me on 21 March 2022.  At that time she had a mild flare and required Medrol treatment for the same.  He had CBC with differential performed that showed no eosinophilia.  She also had a hypersensitivity pneumonitis panel performed which did not show anything revealing.  Since her last visit here, she has been doing better after the Medrol therapy however overall she has noted increased shortness of breath, she also has noted more sleep disruption.  She states she has been told she snores loudly.  She does have nonrestorative sleep.  Feels chest heaviness at times but no chest pain per se.  Has heaviness is relieved with albuterol.  She has noted increased dry cough and occasional sputum production which is yellow.  She has not had any fevers, chills or sweats.  No orthopnea or paroxysmal nocturnal dyspnea.  No lower extremity edema.  No calf tenderness.     DATA 05/23/2021 PFTs: FEV1 1.60 L or 60% predicted, FVC 2.32 L or 69% predicted, FEV1/FVC 69%. Lung volumes show hyperinflation and air trapping there is a small airways component with improvement postbronchodilator.  Diffusion capacity is mildly reduced. 06/14/2021 CT Chest:mild emphysema, 3 mm right middle lobe nodule, 2 mm right upper lobe nodule mild diffuse groundglass  opacity and micronodularity likely related to respiratory bronchiolitis/respiratory bronchiolitis interstitial lung disease  Review of Systems A 10 point review of systems was performed and it is as noted above otherwise negative.  Patient Active Problem List   Diagnosis Date Noted   Dyspnea on exertion 05/24/2022   Respiratory bronchiolitis associated interstitial lung disease (HCC) 05/24/2022   Loud snoring 05/24/2022   Depression, recurrent (HCC) 03/21/2022   Anxiety and depression 03/21/2022   Grief 03/21/2022   Allergic rhinitis 11/24/2021   Stiff back 08/11/2021   Acute bronchitis with COPD (HCC) 07/13/2021   Upper respiratory infection 07/13/2021   Lumbar radiculopathy 07/11/2021   Low back pain 07/11/2021   C. difficile diarrhea 07/04/2021   Gastroesophageal reflux disease without esophagitis 07/01/2020   Hyperlipidemia 07/01/2020   Posttraumatic vulvar hematoma 04/07/2020   Dizziness 03/29/2020   Fatty liver 12/24/2019   Angiolipoma of left kidney 12/24/2019   Chronic ITP (idiopathic thrombocytopenia) (HCC) 12/24/2019   Chronic ITP (idiopathic thrombocytopenic purpura) (HCC) 12/02/2019   Idiopathic thrombocytopenic purpura (ITP) (HCC) 11/10/2019   Verruca vulgaris 09/29/2019   Plantar fasciitis, bilateral 09/29/2019   Osteoarthritis of both knees 09/29/2019   Overactive bladder 07/14/2019   B12 deficiency 06/10/2019   Vitamin D deficiency 06/10/2019   Headache 01/30/2019   Elevated LFTs 01/30/2019   Abnormal uterine bleeding (AUB) 08/26/2018   Depression, major, single episode, moderate (HCC) 08/04/2018   Iron deficiency 09/02/2017   Menorrhagia with regular cycle 10/22/2016   Insomnia 08/15/2016   Nocturia 06/18/2016  Thrombocytopenia (HCC) 02/12/2016   COPD exacerbation (HCC) 01/31/2016   Carpal tunnel syndrome 01/20/2016   Lateral epicondylitis 01/20/2016   Obesity 01/04/2016   Palpitations 12/15/2015   Myalgia 10/31/2015   Fatigue 10/31/2015   Mixed  incontinence 03/09/2015   Asthma-COPD overlap syndrome 01/13/2015   Acute bronchitis 11/15/2014   Female hirsutism 09/22/2014   Elevated testosterone level in female 09/22/2014   Generalized anxiety disorder 08/23/2014   Tobacco use disorder 08/23/2014   Obesity (BMI 30-39.9) 08/23/2014   HTN (hypertension) 08/23/2014   Social History   Tobacco Use   Smoking status: Every Day    Packs/day: 1.50    Years: 28.00    Total pack years: 42.00    Types: Cigarettes   Smokeless tobacco: Never   Tobacco comments:    1.5 ppd increase d/t death of sister.  9/6 hfb    0.5 PPD 05/24/2022-khj  Substance Use Topics   Alcohol use: Yes    Alcohol/week: 8.0 standard drinks of alcohol    Types: 8 Standard drinks or equivalent per week    Comment: Beer (Can)    Allergies  Allergen Reactions   Wellbutrin [Bupropion]     Crazy    Meloxicam Nausea Only   Current Meds  Medication Sig   acetaminophen (TYLENOL) 500 MG tablet Take 500 mg by mouth every 6 (six) hours as needed.   acetaminophen-codeine (TYLENOL #3) 300-30 MG tablet Take 1-2 tablets by mouth every 4 (four) hours as needed for moderate pain.   albuterol (PROVENTIL) (2.5 MG/3ML) 0.083% nebulizer solution Take 3 mLs (2.5 mg total) by nebulization every 6 (six) hours as needed for wheezing or shortness of breath.   albuterol (VENTOLIN HFA) 108 (90 Base) MCG/ACT inhaler INHALE 2 PUFFS INTO THE LUNGS EVERY 6 HOURS AS NEEDED FOR WHEEZING OR SHORTNESS OF BREATH (COUGH)   ALPRAZolam (XANAX) 0.25 MG tablet Take 1 tablet (0.25 mg total) by mouth daily as needed for anxiety.   amLODipine (NORVASC) 10 MG tablet Take 1 tablet (10 mg total) by mouth daily.   ARIPiprazole (ABILIFY) 2 MG tablet Take 1 tablet (2 mg total) by mouth daily. Dc rexulti not covered   BREZTRI AEROSPHERE 160-9-4.8 MCG/ACT AERO INHALE 2 PUFFS INTO THE LUNGS IN THE MORNING AND AT BEDTIME.   carvedilol (COREG) 25 MG tablet Take 1 tablet (25 mg total) by mouth 2 (two) times daily  with a meal.   Cholecalciferol 1.25 MG (50000 UT) capsule Take 1 capsule (50,000 Units total) by mouth every 30 (thirty) days. 1x per month note this   cyanocobalamin (,VITAMIN B-12,) 1000 MCG/ML injection INJECT 1 ML (1,000 MCG TOTAL) INTO THE MUSCLE EVERY 30 DAYS.   diclofenac Sodium (VOLTAREN) 1 % GEL Apply 2-4 g topically 4 (four) times daily. 2 grams upper body qid prn and 4 gram lower body qid prn   escitalopram (LEXAPRO) 20 MG tablet TAKE 1 TABLET BY MOUTH EVERY DAY   fluticasone (FLONASE) 50 MCG/ACT nasal spray SPRAY 1 SPRAY INTO BOTH NOSTRILS DAILY.   levocetirizine (XYZAL) 5 MG tablet Take 1 tablet (5 mg total) by mouth every evening.   omeprazole (PRILOSEC) 20 MG capsule TAKE 1 CAPSULE BY MOUTH EVERY DAY   ondansetron (ZOFRAN) 4 MG tablet TAKE 1 TABLET BY MOUTH EVERY 8 HOURS AS NEEDED FOR NAUSEA AND VOMITING   traZODone (DESYREL) 50 MG tablet TAKE 0.5-1 TABLETS BY MOUTH AT BEDTIME AS NEEDED FOR SLEEP.   Immunization History  Administered Date(s) Administered   Influenza,inj,Quad PF,6+ Mos 08/23/2014  Influenza-Unspecified 04/16/2019, 04/26/2021, 04/23/2022   Tdap 08/23/2014       Objective:   Physical Exam BP 124/80 (BP Location: Left Arm, Cuff Size: Normal)   Pulse 84   Temp 98.1 F (36.7 C)   Ht  (1.575 m)   Wt 205 lb 9.6 oz (93.3 kg)   SpO2 97%   BMI 37.60 kg/m  GENERAL: Awake, well-developed, no acute distress, fully ambulatory.  Overweight.  No conversational dyspnea. HEAD: Normocephalic, atraumatic.  EYES: Pupils equal, round, reactive to light.  No scleral icterus.  MOUTH: Nose/mouth/throat not examined due to masking requirements for COVID 19. NECK: Supple. No thyromegaly. Trachea midline. No JVD.  No adenopathy. PULMONARY: Good air entry bilaterally.  Coarse breath sounds bilaterally with faint end expiratory wheezes noted.   CARDIOVASCULAR: S1 and S2. Regular rate and rhythm.  No rubs, murmurs or gallops heard. ABDOMEN: Benign.   MUSCULOSKELETAL: No  joint deformity, no clubbing, no edema.  NEUROLOGIC: No focal deficit, speech is fluent, no gait disturbance. SKIN: Intact,warm,dry.  Ecchymoses and petechiae upper extremities. PSYCH: Normal mood and behavior.  Ambulatory oximetry was performed today:     05/24/2022    3:00 PM  Results of the Epworth flowsheet  Sitting and reading 0  Watching TV 3  Sitting, inactive in a public place (e.g. a theatre or a meeting) 0  As a passenger in a car for an hour without a break 3  Lying down to rest in the afternoon when circumstances permit 3  Sitting and talking to someone 0  Sitting quietly after a lunch without alcohol 0  In a car, while stopped for a few minutes in traffic 0  Total score 9   Lab Results  Component Value Date   NITRICOXIDE 5 05/24/2022       Assessment & Plan:     ICD-10-CM   1. Asthma-COPD overlap syndrome  J44.89 Nitric oxide    Pulmonary Function Test [facility] Only   Nitric oxide did not show active type II inflammation Will assess with PFTs Continue Breztri 2 puffs twice a day Continue as needed albuterol    2. Dyspnea on exertion  R06.09 ECHOCARDIOGRAM COMPLETE    DG Chest 2 View   Will assess for potential cardiac causes Obtain 2D echo Repeating PFTs as above Chest x-ray PA and lateral    3. Respiratory bronchiolitis associated interstitial lung disease (HCC)  J84.115    Stressed the importance of quitting smoking    4. Loud snoring  R06.83 Home sleep test   Will obtain home sleep study Epworth 9 High suspect for sleep apnea    5. Chronic ITP (idiopathic thrombocytopenia) (HCC)  D69.3    This issue adds complexity to her management Follows with hematology    6. Tobacco dependence due to cigarettes  F17.210 Ambulatory Referral for Lung Cancer Scre   Patient counseled regards to discontinuation of smoking Total counseling time 3 to 5 minutes Enroll in lung cancer screening     Orders Placed This Encounter  Procedures   DG Chest 2 View     Standing Status:   Future    Standing Expiration Date:   05/25/2023    Order Specific Question:   Reason for Exam (SYMPTOM  OR DIAGNOSIS REQUIRED)    Answer:   Shortness of breath    Order Specific Question:   Is patient pregnant?    Answer:   No    Order Specific Question:   Preferred imaging location?  Answer:   [facility]   Ambulatory Referral for Lung Cancer Scre    Referral Priority:   Routine    Referral Type:   Consultation    Referral Reason:   Specialty Services Required    Number of Visits Requested:   1   Nitric oxide   Pulmonary Function Test [facility] Only    Standing Status:   Future    Standing Expiration Date:   05/25/2023    Order Specific Question:   Full PFT: includes the following: basic spirometry, spirometry pre & post bronchodilator, diffusion capacity (DLCO), lung volumes    Answer:   Full PFT    Order Specific Question:   This test can only be performed at    Answer:   [facility]   ECHOCARDIOGRAM COMPLETE    Standing Status:   Future    Standing Expiration Date:   05/25/2023    Order Specific Question:   Where should this test be performed    Answer:   [facility] [place]    Order Specific Question:   Perflutren DEFINITY (image enhancing agent) should be administered unless hypersensitivity or allergy exist    Answer:   Administer Perflutren    Order Specific Question:   Reason for exam-Echo    Answer:   Dyspnea  R06.00   Home sleep test    Standing Status:   Future    Standing Expiration Date:   05/25/2023    Order Specific Question:   Where should this test be performed:    Answer:   [facility]   We will reassess the patient's pulmonary and cardiac function to verify her issues with dyspnea.  We will see her in follow-up in 6 to 8 weeks time she is to call sooner should any new difficulties arise.  Gailen Shelter, MD Advanced Bronchoscopy [facility]    *This note was dictated using voice recognition  software/Dragon.  Despite best efforts to proofread, errors can occur which can change the meaning. Any transcriptional errors that result from this process are unintentional and may not be fully corrected at the time of dictation.

## 2022-05-25 ENCOUNTER — Telehealth: Payer: Self-pay | Admitting: Internal Medicine

## 2022-05-25 ENCOUNTER — Other Ambulatory Visit
Admission: RE | Admit: 2022-05-25 | Discharge: 2022-05-25 | Disposition: A | Discharge status: Home / Self Care | Payer: BC Managed Care – PPO | Source: Ambulatory Visit | Attending: Family Medicine | Admitting: Family Medicine

## 2022-05-25 DIAGNOSIS — R197 Diarrhea, unspecified: Secondary | ICD-10-CM | POA: Insufficient documentation

## 2022-05-25 LAB — C DIFFICILE QUICK SCREEN W PCR REFLEX
C Diff antigen: POSITIVE — AB
C Diff toxin: NEGATIVE

## 2022-05-25 LAB — CLOSTRIDIUM DIFFICILE BY PCR, REFLEXED: Toxigenic C. Difficile by PCR: NEGATIVE

## 2022-05-25 NOTE — Telephone Encounter (Signed)
Patient states she was just informed she is Positive for C diff. Patient wanted to know if she could go to a family gathering this weekend and I stated no, you are very contagious and c diff spreads very fast. Patient wants to know the plan of care and if she can have a work note since she is contagious. Please advise

## 2022-05-25 NOTE — Telephone Encounter (Signed)
Patient called and said she tested positive for C-diff. She wanted to know if she could go to a family gathering this weekend.

## 2022-05-28 NOTE — Telephone Encounter (Signed)
Spoke to Patient to let her know that she does not have C Diff infection that her test showed a positive antigen.

## 2022-05-29 LAB — STOOL CULTURE REFLEX - CMPCXR

## 2022-05-29 LAB — GIARDIA, EIA; OVA/PARASITE: Giardia Ag, Stl: NEGATIVE

## 2022-05-29 LAB — STOOL CULTURE REFLEX - RSASHR

## 2022-05-29 LAB — STOOL CULTURE: E coli, Shiga toxin Assay: NEGATIVE

## 2022-05-29 LAB — O&P RESULT

## 2022-05-30 ENCOUNTER — Encounter: Payer: Self-pay | Admitting: *Deleted

## 2022-05-30 NOTE — Telephone Encounter (Signed)
Spoke to Patient she is better.

## 2022-06-07 ENCOUNTER — Encounter: Payer: Self-pay | Admitting: Family Medicine

## 2022-06-07 DIAGNOSIS — R197 Diarrhea, unspecified: Secondary | ICD-10-CM | POA: Insufficient documentation

## 2022-06-07 HISTORY — DX: Diarrhea, unspecified: R19.7

## 2022-06-07 NOTE — Assessment & Plan Note (Addendum)
3 weeks of nonbloody mucousy diarrhea with bloating and abdominal cramping that improves with bowel movements.  Suspect IBS however given recent antibiotic use and history of C. difficile will send stool to rule out any infectious etiology and obtain labs to ensure no electrolyte abnormalities.  Reports having GI appointment scheduled for December 23. Strict return precautions provided

## 2022-06-13 ENCOUNTER — Other Ambulatory Visit: Payer: Self-pay | Admitting: Podiatry

## 2022-06-13 ENCOUNTER — Telehealth: Payer: BC Managed Care – PPO | Admitting: Physician Assistant

## 2022-06-13 DIAGNOSIS — J069 Acute upper respiratory infection, unspecified: Secondary | ICD-10-CM | POA: Diagnosis not present

## 2022-06-13 MED ORDER — BENZONATATE 100 MG PO CAPS: 100.0000 mg | ORAL_CAPSULE | Freq: Three times a day (TID) | ORAL | 0 refills | Status: DC | PRN

## 2022-06-13 MED ORDER — IPRATROPIUM BROMIDE 0.03 % NA SOLN: 2.0000 | Freq: Two times a day (BID) | NASAL | 0 refills | Status: DC

## 2022-06-13 NOTE — Progress Notes (Signed)
E-Visit for Upper Respiratory Infection   We are sorry you are not feeling well.  Here is how we plan to help!  Based on what you have shared with me, it looks like you may have a viral upper respiratory infection.  Upper respiratory infections are caused by a large number of viruses; however, rhinovirus is the most common cause.   Symptoms vary from person to person, with common symptoms including sore throat, cough, fatigue or lack of energy and feeling of general discomfort.  A low-grade fever of up to 100.4 may present, but is often uncommon.  Symptoms vary however, and are closely related to a person's age or underlying illnesses.  The most common symptoms associated with an upper respiratory infection are nasal discharge or congestion, cough, sneezing, headache and pressure in the ears and face.  These symptoms usually persist for about 3 to 10 days, but can last up to 2 weeks.  It is important to know that upper respiratory infections do not cause serious illness or complications in most cases.    Upper respiratory infections can be transmitted from person to person, with the most common method of transmission being a person's hands.  The virus is able to live on the skin and can infect other persons for up to 2 hours after direct contact.  Also, these can be transmitted when someone coughs or sneezes; thus, it is important to cover the mouth to reduce this risk.  To keep the spread of the illness at bay, good hand hygiene is very important.  This is an infection that is most likely caused by a virus. There are no specific treatments other than to help you with the symptoms until the infection runs its course.  We are sorry you are not feeling well.  Here is how we plan to help!   For nasal congestion, you may use an oral decongestants such as Mucinex D or if you have glaucoma or high blood pressure use plain Mucinex.  Saline nasal spray or nasal drops can help and can safely be used as often as  needed for congestion.  For your congestion, I have prescribed Ipratropium Bromide nasal spray 0.03% two sprays in each nostril 2-3 times a day  If you do not have a history of heart disease, hypertension, diabetes or thyroid disease, prostate/bladder issues or glaucoma, you may also use Sudafed to treat nasal congestion.  It is highly recommended that you consult with a pharmacist or your primary care physician to ensure this medication is safe for you to take.     If you have a cough, you may use cough suppressants such as Delsym and Robitussin.  If you have glaucoma or high blood pressure, you can also use Coricidin HBP.   For cough I have prescribed for you A prescription cough medication called Tessalon Perles 100 mg. You may take 1-2 capsules every 8 hours as needed for cough  If you have a sore or scratchy throat, use a saltwater gargle-  to  teaspoon of salt dissolved in a 4-ounce to 8-ounce glass of warm water.  Gargle the solution for approximately 15-30 seconds and then spit.  It is important not to swallow the solution.  You can also use throat lozenges/cough drops and Chloraseptic spray to help with throat pain or discomfort.  Warm or cold liquids can also be helpful in relieving throat pain.  For headache, pain or general discomfort, you can use Ibuprofen or Tylenol as directed.     Some authorities believe that zinc sprays or the use of Echinacea may shorten the course of your symptoms.   HOME CARE Only take medications as instructed by your medical team. Be sure to drink plenty of fluids. Water is fine as well as fruit juices, sodas and electrolyte beverages. You may want to stay away from caffeine or alcohol. If you are nauseated, try taking small sips of liquids. How do you know if you are getting enough fluid? Your urine should be a pale yellow or almost colorless. Get rest. Taking a steamy shower or using a humidifier may help nasal congestion and ease sore throat pain. You can  place a towel over your head and breathe in the steam from hot water coming from a faucet. Using a saline nasal spray works much the same way. Cough drops, hard candies and sore throat lozenges may ease your cough. Avoid close contacts especially the very young and the elderly Cover your mouth if you cough or sneeze Always remember to wash your hands.   GET HELP RIGHT AWAY IF: You develop worsening fever. If your symptoms do not improve within 10 days You develop yellow or green discharge from your nose over 3 days. You have coughing fits You develop a severe head ache or visual changes. You develop shortness of breath, difficulty breathing or start having chest pain Your symptoms persist after you have completed your treatment plan  MAKE SURE YOU  Understand these instructions. Will watch your condition. Will get help right away if you are not doing well or get worse.  Thank you for choosing an e-visit.  Your e-visit answers were reviewed by a board certified advanced clinical practitioner to complete your personal care plan. Depending upon the condition, your plan could have included both over the counter or prescription medications.  Please review your pharmacy choice. Make sure the pharmacy is open so you can pick up prescription now. If there is a problem, you may contact your provider through MyChart messaging and have the prescription routed to another pharmacy.  Your safety is important to us. If you have drug allergies check your prescription carefully.   For the next 24 hours you can use MyChart to ask questions about today's visit, request a non-urgent call back, or ask for a work or school excuse. You will get an email in the next two days asking about your experience. I hope that your e-visit has been valuable and will speed your recovery.  I have spent 5 minutes in review of e-visit questionnaire, review and updating patient chart, medical decision making and response to  patient.   Jarick Harkins M Shelina Luo, PA-C  

## 2022-06-14 ENCOUNTER — Other Ambulatory Visit: Payer: Self-pay | Admitting: Podiatry

## 2022-06-18 ENCOUNTER — Telehealth: Payer: BC Managed Care – PPO | Admitting: Physician Assistant

## 2022-06-18 DIAGNOSIS — B9689 Other specified bacterial agents as the cause of diseases classified elsewhere: Secondary | ICD-10-CM | POA: Diagnosis not present

## 2022-06-18 DIAGNOSIS — J4551 Severe persistent asthma with (acute) exacerbation: Secondary | ICD-10-CM | POA: Diagnosis not present

## 2022-06-18 DIAGNOSIS — J208 Acute bronchitis due to other specified organisms: Secondary | ICD-10-CM | POA: Diagnosis not present

## 2022-06-18 DIAGNOSIS — J441 Chronic obstructive pulmonary disease with (acute) exacerbation: Secondary | ICD-10-CM | POA: Diagnosis not present

## 2022-06-18 MED ORDER — PREDNISONE 10 MG PO TABS: ORAL_TABLET | ORAL | 0 refills | Status: DC

## 2022-06-18 MED ORDER — PROMETHAZINE-DM 6.25-15 MG/5ML PO SYRP: 5.0000 mL | ORAL_SOLUTION | Freq: Four times a day (QID) | ORAL | 0 refills | Status: DC | PRN

## 2022-06-18 MED ORDER — AZITHROMYCIN 250 MG PO TABS: ORAL_TABLET | ORAL | 0 refills | Status: AC

## 2022-06-18 NOTE — Progress Notes (Signed)
Virtual Visit Consent   Carly [name], you are scheduled for a virtual visit with a [facility] provider today. Just as with appointments in the office, your consent must be obtained to participate. Your consent will be active for this visit and any virtual visit you may have with one of our providers in the next 365 days. If you have a MyChart account, a copy of this consent can be sent to you electronically.  As this is a virtual visit, video technology does not allow for your provider to perform a traditional examination. This may limit your provider's ability to fully assess your condition. If your provider identifies any concerns that need to be evaluated in person or the need to arrange testing (such as labs, EKG, etc.), we will make arrangements to do so. Although advances in technology are sophisticated, we cannot ensure that it will always work on either your end or our end. If the connection with a video visit is poor, the visit may have to be switched to a telephone visit. With either a video or telephone visit, we are not always able to ensure that we have a secure connection.  By engaging in this virtual visit, you consent to the provision of healthcare and authorize for your insurance to be billed (if applicable) for the services provided during this visit. Depending on your insurance coverage, you may receive a charge related to this service.  I need to obtain your verbal consent now. Are you willing to proceed with your visit today? Carly [name] has provided verbal consent on 06/18/2022 for a virtual visit (video or telephone). Mar Daring, PA-C  Date: 06/18/2022 11:50 AM  Virtual Visit via Video Note   [name], [name]  ([id number], 1971-06-02) on 06/18/22 at 11:45 AM EST by a video-enabled telemedicine application and verified that I am speaking with the correct person using two identifiers.  Location: Patient: Virtual Visit Location  Patient: Home Provider: Virtual Visit Location Provider: Home Office   I discussed the limitations of evaluation and management by telemedicine and the availability of in person appointments. The patient expressed understanding and agreed to proceed.    History of Present Illness: Carly [name] is a 51 y.o. who identifies as a female who was assigned female at birth, and is being seen today for cough.  HPI: Cough This is a recurrent problem. The current episode started 1 to 4 weeks ago. The problem has been gradually worsening. The problem occurs every few minutes. The cough is Productive of sputum and productive of purulent sputum (and dry). Associated symptoms include chest pain (tightness), chills, ear congestion (feeling it drain), headaches (sinus headache), nasal congestion, postnasal drip, shortness of breath and wheezing. Pertinent negatives include no ear pain, fever, hemoptysis, rhinorrhea or sweats. The symptoms are aggravated by lying down. She has tried a beta-agonist inhaler, steroid inhaler and leukotriene antagonists (mucinex and tylenol cold and severe) for the symptoms. The treatment provided no relief. Her past medical history is significant for asthma, bronchitis, COPD and pneumonia.   Oxygen during the day is 95%, at night it can drop to 88-90% with lying down; reports normal readings without illness is 95% all the time  Had CXR 05/24/22 and was normal at that time   Problems:  Patient Active Problem List   Diagnosis Date Noted   Diarrhea 06/07/2022   Dyspnea on exertion 05/24/2022   Allergic rhinitis 11/24/2021   Lumbar radiculopathy 07/11/2021  Gastroesophageal reflux disease without esophagitis 07/01/2020   Hyperlipidemia 07/01/2020   Fatty liver 12/24/2019   Angiolipoma of left kidney 12/24/2019   Chronic ITP (idiopathic thrombocytopenic purpura) (HCC) 12/02/2019   Plantar fasciitis, bilateral 09/29/2019   Osteoarthritis of both knees 09/29/2019   Overactive  bladder 07/14/2019   B12 deficiency 06/10/2019   Vitamin D deficiency 06/10/2019   Abnormal uterine bleeding (AUB) 08/26/2018   Depression, major, single episode, moderate ([place]) 08/04/2018   Iron deficiency 09/02/2017   Insomnia 08/15/2016   Thrombocytopenia ([place]) 02/12/2016   Carpal tunnel syndrome 01/20/2016   Asthma-COPD overlap syndrome 01/13/2015   Female hirsutism 09/22/2014   Elevated testosterone level in female 09/22/2014   Generalized anxiety disorder 08/23/2014   Tobacco use disorder 08/23/2014   Obesity (BMI 30-39.9) 08/23/2014   HTN (hypertension) 08/23/2014    Allergies:  Allergies  Allergen Reactions   Wellbutrin [Bupropion]     Crazy    Meloxicam Nausea Only   Medications:  Current Outpatient Medications:    acetaminophen (TYLENOL) 500 MG tablet, Take 500 mg by mouth every 6 (six) hours as needed., Disp: , Rfl:    acetaminophen-codeine (TYLENOL #3) 300-30 MG tablet, Take 1-2 tablets by mouth every 4 (four) hours as needed for moderate pain., Disp: 30 tablet, Rfl: 0   albuterol (PROVENTIL) (2.5 MG/3ML) 0.083% nebulizer solution, Take 3 mLs (2.5 mg total) by nebulization every 6 (six) hours as needed for wheezing or shortness of breath., Disp: 360 mL, Rfl: 1   albuterol (VENTOLIN HFA) 108 (90 Base) MCG/ACT inhaler, INHALE 2 PUFFS INTO THE LUNGS EVERY 6 HOURS AS NEEDED FOR WHEEZING OR SHORTNESS OF BREATH (COUGH), Disp: 6.7 each, Rfl: 3   ALPRAZolam (XANAX) 0.25 MG tablet, Take 1 tablet (0.25 mg total) by mouth daily as needed for anxiety., Disp: 30 tablet, Rfl: 2   amLODipine (NORVASC) 10 MG tablet, Take 1 tablet (10 mg total) by mouth daily., Disp: 90 tablet, Rfl: 3   ARIPiprazole (ABILIFY) 2 MG tablet, Take 1 tablet (2 mg total) by mouth daily. Dc rexulti not covered, Disp: 90 tablet, Rfl: 3   azithromycin (ZITHROMAX) 250 MG tablet, Take 2 tablets on day 1, then 1 tablet daily on days 2 through 5, Disp: 6 tablet, Rfl: 0   benzonatate (TESSALON) 100 MG capsule, Take 1  capsule (100 mg total) by mouth 3 (three) times daily as needed., Disp: 30 capsule, Rfl: 0   BREZTRI AEROSPHERE 160-9-4.8 MCG/ACT AERO, INHALE 2 PUFFS INTO THE LUNGS IN THE MORNING AND AT BEDTIME., Disp: 10.7 each, Rfl: 5   carvedilol (COREG) 25 MG tablet, Take 1 tablet (25 mg total) by mouth 2 (two) times daily with a meal., Disp: 180 tablet, Rfl: 3   Cholecalciferol 1.25 MG (50000 UT) capsule, Take 1 capsule (50,000 Units total) by mouth every 30 (thirty) days. 1x per month note this, Disp: 13 capsule, Rfl: 0   cyanocobalamin (,VITAMIN B-12,) 1000 MCG/ML injection, INJECT 1 ML (1,000 MCG TOTAL) INTO THE MUSCLE EVERY 30 DAYS., Disp: 1 mL, Rfl: 15   diclofenac Sodium (VOLTAREN) 1 % GEL, Apply 2-4 g topically 4 (four) times daily. 2 grams upper body qid prn and 4 gram lower body qid prn, Disp: 150 g, Rfl: 11   dicyclomine (BENTYL) 10 MG capsule, TAKE 1 CAPSULE (10 MG TOTAL) BY MOUTH 3 (THREE) TIMES DAILY AS NEEDED FOR SPASMS. BEFORE FOOD (Patient not taking: Reported on 05/24/2022), Disp: 90 capsule, Rfl: 11   escitalopram (LEXAPRO) 20 MG tablet, TAKE 1 TABLET BY MOUTH  EVERY DAY, Disp: 90 tablet, Rfl: 4   fluticasone (FLONASE) 50 MCG/ACT nasal spray, SPRAY 1 SPRAY INTO BOTH NOSTRILS DAILY., Disp: 16 mL, Rfl: 2   ipratropium (ATROVENT) 0.03 % nasal spray, Place 2 sprays into both nostrils every 12 (twelve) hours., Disp: 30 mL, Rfl: 0   levocetirizine (XYZAL) 5 MG tablet, Take 1 tablet (5 mg total) by mouth every evening., Disp: 30 tablet, Rfl: 0   omeprazole (PRILOSEC) 20 MG capsule, TAKE 1 CAPSULE BY MOUTH EVERY DAY, Disp: 90 capsule, Rfl: 3   ondansetron (ZOFRAN) 4 MG tablet, TAKE 1 TABLET BY MOUTH EVERY 8 HOURS AS NEEDED FOR NAUSEA AND VOMITING, Disp: 10 tablet, Rfl: 11   predniSONE (DELTASONE) 10 MG tablet, Take 6 tablets PO on day 1 and day 2, take 5 tablets PO on day 3 and day 4, take 4 tablets PO on day 5 and day 6, take 3 tablets PO on day 7 and day 8, take 2 tablets PO on day 9 and day 10, take  one tablet PO on day 11 and day 12., Disp: 42 tablet, Rfl: 0   promethazine-dextromethorphan (PROMETHAZINE-DM) 6.25-15 MG/5ML syrup, Take 5 mLs by mouth 4 (four) times daily as needed., Disp: 118 mL, Rfl: 0   traZODone (DESYREL) 50 MG tablet, TAKE 0.5-1 TABLETS BY MOUTH AT BEDTIME AS NEEDED FOR SLEEP., Disp: 90 tablet, Rfl: 3  Observations/Objective: Patient is well-developed, well-nourished in no acute distress.  Resting comfortably at home.  Head is normocephalic, atraumatic.  No labored breathing.  Speech is clear and coherent with logical content.  Patient is alert and oriented at baseline.  Dry, deep, barking cough heard multiple times through call without restricting speech  Assessment and Plan: 1. COPD exacerbation (HCC) - azithromycin (ZITHROMAX) 250 MG tablet; Take 2 tablets on day 1, then 1 tablet daily on days 2 through 5  Dispense: 6 tablet; Refill: 0 - predniSONE (DELTASONE) 10 MG tablet; Take 6 tablets PO on day 1 and day 2, take 5 tablets PO on day 3 and day 4, take 4 tablets PO on day 5 and day 6, take 3 tablets PO on day 7 and day 8, take 2 tablets PO on day 9 and day 10, take one tablet PO on day 11 and day 12.  Dispense: 42 tablet; Refill: 0 - promethazine-dextromethorphan (PROMETHAZINE-DM) 6.25-15 MG/5ML syrup; Take 5 mLs by mouth 4 (four) times daily as needed.  Dispense: 118 mL; Refill: 0  2. Severe persistent asthma with exacerbation - azithromycin (ZITHROMAX) 250 MG tablet; Take 2 tablets on day 1, then 1 tablet daily on days 2 through 5  Dispense: 6 tablet; Refill: 0 - predniSONE (DELTASONE) 10 MG tablet; Take 6 tablets PO on day 1 and day 2, take 5 tablets PO on day 3 and day 4, take 4 tablets PO on day 5 and day 6, take 3 tablets PO on day 7 and day 8, take 2 tablets PO on day 9 and day 10, take one tablet PO on day 11 and day 12.  Dispense: 42 tablet; Refill: 0 - promethazine-dextromethorphan (PROMETHAZINE-DM) 6.25-15 MG/5ML syrup; Take 5 mLs by mouth 4 (four) times  daily as needed.  Dispense: 118 mL; Refill: 0  3. Acute bacterial bronchitis - azithromycin (ZITHROMAX) 250 MG tablet; Take 2 tablets on day 1, then 1 tablet daily on days 2 through 5  Dispense: 6 tablet; Refill: 0 - promethazine-dextromethorphan (PROMETHAZINE-DM) 6.25-15 MG/5ML syrup; Take 5 mLs by mouth 4 (four) times daily as needed.  Dispense: 118 mL; Refill: 0  - Worsening over a week despite OTC medications with significant pulmonary history of asthma-copd overlap syndrome, bronchiolitis with restrictive interstitial lung disease in a current every day smoker - Will treat with Z-pack, Prednisone, Promethazine DM and tessalon perles (already has tessalon) - Can continue Mucinex  - Push fluids.  - Rest.  - Steam and humidifier can help - Seek in person evaluation if worsening or symptoms fail to improve    Follow Up Instructions: I discussed the assessment and treatment plan with the patient. The patient was provided an opportunity to ask questions and all were answered. The patient agreed with the plan and demonstrated an understanding of the instructions.  A copy of instructions were sent to the patient via MyChart unless otherwise noted below.    The patient was advised to call back or seek an in-person evaluation if the symptoms worsen or if the condition fails to improve as anticipated.  Time:  I spent 11 minutes with the patient via telehealth technology discussing the above problems/concerns.    Mar Daring, PA-C

## 2022-06-18 NOTE — Patient Instructions (Signed)
Lin Landsman, thank you for joining Mar Daring, PA-C for today's virtual visit.  While this provider is not your primary care provider (PCP), if your PCP is located in our provider database this encounter information will be shared with them immediately following your visit.   [facility] account gives you access to today's visit and all your visits, tests, and labs performed at [facility] " click here if you don't have a [facility] account or go to [email or web address]  Consent: (Patient) Carly [name] provided verbal consent for this virtual visit at the beginning of the encounter.  Current Medications:  Current Outpatient Medications:    azithromycin (ZITHROMAX) 250 MG tablet, Take 2 tablets on day 1, then 1 tablet daily on days 2 through 5, Disp: 6 tablet, Rfl: 0   predniSONE (DELTASONE) 10 MG tablet, Take 6 tablets PO on day 1 and day 2, take 5 tablets PO on day 3 and day 4, take 4 tablets PO on day 5 and day 6, take 3 tablets PO on day 7 and day 8, take 2 tablets PO on day 9 and day 10, take one tablet PO on day 11 and day 12., Disp: 42 tablet, Rfl: 0   promethazine-dextromethorphan (PROMETHAZINE-DM) 6.25-15 MG/5ML syrup, Take 5 mLs by mouth 4 (four) times daily as needed., Disp: 118 mL, Rfl: 0   acetaminophen (TYLENOL) 500 MG tablet, Take 500 mg by mouth every 6 (six) hours as needed., Disp: , Rfl:    acetaminophen-codeine (TYLENOL #3) 300-30 MG tablet, Take 1-2 tablets by mouth every 4 (four) hours as needed for moderate pain., Disp: 30 tablet, Rfl: 0   albuterol (PROVENTIL) (2.5 MG/3ML) 0.083% nebulizer solution, Take 3 mLs (2.5 mg total) by nebulization every 6 (six) hours as needed for wheezing or shortness of breath., Disp: 360 mL, Rfl: 1   albuterol (VENTOLIN HFA) 108 (90 Base) MCG/ACT inhaler, INHALE 2 PUFFS INTO THE LUNGS EVERY 6 HOURS AS NEEDED FOR WHEEZING OR SHORTNESS OF BREATH (COUGH), Disp: 6.7 each, Rfl: 3   ALPRAZolam (XANAX)  0.25 MG tablet, Take 1 tablet (0.25 mg total) by mouth daily as needed for anxiety., Disp: 30 tablet, Rfl: 2   amLODipine (NORVASC) 10 MG tablet, Take 1 tablet (10 mg total) by mouth daily., Disp: 90 tablet, Rfl: 3   ARIPiprazole (ABILIFY) 2 MG tablet, Take 1 tablet (2 mg total) by mouth daily. Dc rexulti not covered, Disp: 90 tablet, Rfl: 3   benzonatate (TESSALON) 100 MG capsule, Take 1 capsule (100 mg total) by mouth 3 (three) times daily as needed., Disp: 30 capsule, Rfl: 0   BREZTRI AEROSPHERE 160-9-4.8 MCG/ACT AERO, INHALE 2 PUFFS INTO THE LUNGS IN THE MORNING AND AT BEDTIME., Disp: 10.7 each, Rfl: 5   carvedilol (COREG) 25 MG tablet, Take 1 tablet (25 mg total) by mouth 2 (two) times daily with a meal., Disp: 180 tablet, Rfl: 3   Cholecalciferol 1.25 MG (50000 UT) capsule, Take 1 capsule (50,000 Units total) by mouth every 30 (thirty) days. 1x per month note this, Disp: 13 capsule, Rfl: 0   cyanocobalamin (,VITAMIN B-12,) 1000 MCG/ML injection, INJECT 1 ML (1,000 MCG TOTAL) INTO THE MUSCLE EVERY 30 DAYS., Disp: 1 mL, Rfl: 15   diclofenac Sodium (VOLTAREN) 1 % GEL, Apply 2-4 g topically 4 (four) times daily. 2 grams upper body qid prn and 4 gram lower body qid prn, Disp: 150 g, Rfl: 11   dicyclomine (BENTYL) 10 MG capsule, TAKE 1 CAPSULE (  10 MG TOTAL) BY MOUTH 3 (THREE) TIMES DAILY AS NEEDED FOR SPASMS. BEFORE FOOD (Patient not taking: Reported on 05/24/2022), Disp: 90 capsule, Rfl: 11   escitalopram (LEXAPRO) 20 MG tablet, TAKE 1 TABLET BY MOUTH EVERY DAY, Disp: 90 tablet, Rfl: 4   fluticasone (FLONASE) 50 MCG/ACT nasal spray, SPRAY 1 SPRAY INTO BOTH NOSTRILS DAILY., Disp: 16 mL, Rfl: 2   ipratropium (ATROVENT) 0.03 % nasal spray, Place 2 sprays into both nostrils every 12 (twelve) hours., Disp: 30 mL, Rfl: 0   levocetirizine (XYZAL) 5 MG tablet, Take 1 tablet (5 mg total) by mouth every evening., Disp: 30 tablet, Rfl: 0   omeprazole (PRILOSEC) 20 MG capsule, TAKE 1 CAPSULE BY MOUTH EVERY DAY,  Disp: 90 capsule, Rfl: 3   ondansetron (ZOFRAN) 4 MG tablet, TAKE 1 TABLET BY MOUTH EVERY 8 HOURS AS NEEDED FOR NAUSEA AND VOMITING, Disp: 10 tablet, Rfl: 11   traZODone (DESYREL) 50 MG tablet, TAKE 0.5-1 TABLETS BY MOUTH AT BEDTIME AS NEEDED FOR SLEEP., Disp: 90 tablet, Rfl: 3   Medications ordered in this encounter:  Meds ordered this encounter  Medications   azithromycin (ZITHROMAX) 250 MG tablet    Sig: Take 2 tablets on day 1, then 1 tablet daily on days 2 through 5    Dispense:  6 tablet    Refill:  0    Order Specific Question:   Supervising Provider    Answer:   Chase Picket [[id number]]   predniSONE (DELTASONE) 10 MG tablet    Sig: Take 6 tablets PO on day 1 and day 2, take 5 tablets PO on day 3 and day 4, take 4 tablets PO on day 5 and day 6, take 3 tablets PO on day 7 and day 8, take 2 tablets PO on day 9 and day 10, take one tablet PO on day 11 and day 12.    Dispense:  42 tablet    Refill:  0    Order Specific Question:   Supervising Provider    Answer:   Chase Picket [id number]   promethazine-dextromethorphan (PROMETHAZINE-DM) 6.25-15 MG/5ML syrup    Sig: Take 5 mLs by mouth 4 (four) times daily as needed.    Dispense:  118 mL    Refill:  0    Order Specific Question:   Supervising Provider    Answer:   Chase Picket [[id number]]     *If you need refills on other medications prior to your next appointment, please contact your pharmacy*  Follow-Up: Call back or seek an in-person evaluation if the symptoms worsen or if the condition fails to improve as anticipated.  [facility] [phone]  Other Instructions Acute Bronchitis, Adult  Acute bronchitis is sudden inflammation of the main airways (bronchi) that come off the windpipe (trachea) in the lungs. The swelling causes the airways to get smaller and make more mucus than normal. This can make it hard to breathe and can cause coughing or noisy breathing (wheezing). Acute bronchitis may last  several weeks. The cough may last longer. Allergies, asthma, and exposure to smoke may make the condition worse. What are the causes? This condition can be caused by germs and by substances that irritate the lungs, including: Cold and flu viruses. The most common cause of this condition is the virus that causes the common cold. Bacteria. This is less common. Breathing in substances that irritate the lungs, including: Smoke from cigarettes and other forms of tobacco. Dust and pollen.  Fumes from household cleaning products, gases, or burned fuel. Indoor or outdoor air pollution. What increases the risk? The following factors may make you more likely to develop this condition: A weak body's defense system, also called the immune system. A condition that affects your lungs and breathing, such as asthma. What are the signs or symptoms? Common symptoms of this condition include: Coughing. This may bring up clear, yellow, or green mucus from your lungs (sputum). Wheezing. Runny or stuffy nose. Having too much mucus in your lungs (chest congestion). Shortness of breath. Aches and pains, including sore throat or chest. How is this diagnosed? This condition is usually diagnosed based on: Your symptoms and medical history. A physical exam. You may also have other tests, including tests to rule out other conditions, such as pneumonia. These tests include: A test of lung function. Test of a mucus sample to look for the presence of bacteria. Tests to check the oxygen level in your blood. Blood tests. Chest X-ray. How is this treated? Most cases of acute bronchitis clear up over time without treatment. Your health care provider may recommend: Drinking more fluids to help thin your mucus so it is easier to cough up. Taking inhaled medicine (inhaler) to improve air flow in and out of your lungs. Using a vaporizer or a humidifier. These are machines that add water to the air to help you breathe  better. Taking a medicine that thins mucus and clears congestion (expectorant). Taking a medicine that prevents or stops coughing (cough suppressant). It is not common to take an antibiotic medicine for this condition. Follow these instructions at home:  Take over-the-counter and prescription medicines only as told by your health care provider. Use an inhaler, vaporizer, or humidifier as told by your health care provider. Take two teaspoons (10 mL) of honey at bedtime to lessen coughing at night. Drink enough fluid to keep your urine pale yellow. Do not use any products that contain nicotine or tobacco. These products include cigarettes, chewing tobacco, and vaping devices, such as e-cigarettes. If you need help quitting, ask your health care provider. Get plenty of rest. Return to your normal activities as told by your health care provider. Ask your health care provider what activities are safe for you. Keep all follow-up visits. This is important. How is this prevented? To lower your risk of getting this condition again: Wash your hands often with soap and water for at least 20 seconds. If soap and water are not available, use hand sanitizer. Avoid contact with people who have cold symptoms. Try not to touch your mouth, nose, or eyes with your hands. Avoid breathing in smoke or chemical fumes. Breathing smoke or chemical fumes will make your condition worse. Get the flu shot every year. Contact a health care provider if: Your symptoms do not improve after 2 weeks. You have trouble coughing up the mucus. Your cough keeps you awake at night. You have a fever. Get help right away if you: Cough up blood. Feel pain in your chest. Have severe shortness of breath. Faint or keep feeling like you are going to faint. Have a severe headache. Have a fever or chills that get worse. These symptoms may represent a serious problem that is an emergency. Do not wait to see if the symptoms will go  away. Get medical help right away. Call your local emergency services (911 in the [place] Do not drive yourself to the hospital. Summary Acute bronchitis is inflammation of the main airways (bronchi)  that come off the windpipe (trachea) in the lungs. The swelling causes the airways to get smaller and make more mucus than normal. Drinking more fluids can help thin your mucus so it is easier to cough up. Take over-the-counter and prescription medicines only as told by your health care provider. Do not use any products that contain nicotine or tobacco. These products include cigarettes, chewing tobacco, and vaping devices, such as e-cigarettes. If you need help quitting, ask your health care provider. Contact a health care provider if your symptoms do not improve after 2 weeks. This information is not intended to replace advice given to you by your health care provider. Make sure you discuss any questions you have with your health care provider. Document Revised: 10/12/2021 Document Reviewed: 11/02/2020 Elsevier Patient Education  [place].    If you have been instructed to have an in-person evaluation today at a local Urgent Care facility, please use the link below. It will take you to a list of all of our available [facility], including address, phone number and hours of operation. Please do not delay care.  [facility]  If you or a family member do not have a primary care provider, use the link below to schedule a visit and establish care. When you choose a [facility] primary care physician or advanced practice provider, you gain a long-term partner in health. Find a Primary Care Provider  Learn more about [facility]'s in-office and virtual care options: [facility]

## 2022-06-20 ENCOUNTER — Encounter: Payer: BC Managed Care – PPO | Admitting: Family Medicine

## 2022-06-22 ENCOUNTER — Telehealth: Payer: Self-pay

## 2022-06-22 NOTE — Telephone Encounter (Signed)
Transition Care Management Follow-up Telephone Call Date of discharge and from where: [facility] ED 06/20/2022 How have you been since you were released from the hospital? Breathing better Any questions or concerns? Yes  Items Reviewed: Did the pt receive and understand the discharge instructions provided? Yes  Medications obtained and verified? Yes  Other? No  Any new allergies since your discharge? No  Dietary orders reviewed? Yes Do you have support at home? Yes   Home Care and Equipment/Supplies: Were home health services ordered? no If so, what is the name of the agency? N/a  Has the agency set up a time to come to the patient's home? not applicable Were any new equipment or medical supplies ordered?  No What is the name of the medical supply agency? N/a Were you able to get the supplies/equipment? not applicable Do you have any questions related to the use of the equipment or supplies? No  Functional Questionnaire: (I = Independent and D = Dependent) ADLs: I  Bathing/Dressing- I  Meal Prep- I  Eating- I  Maintaining continence- I  Transferring/Ambulation- I  Managing Meds- I  Follow up appointments reviewed:  PCP Hospital f/u appt confirmed? No  No avail. Appts. Will send message to staff to schedule Specialist Hospital f/u appt confirmed? No  Are transportation arrangements needed? No  If their condition worsens, is the pt aware to call PCP or go to the Emergency Dept.? Yes Was the patient provided with contact information for the PCP's office or ED? Yes Was to pt encouraged to call back with questions or concerns? Yes Juanda Crumble, LPN [place] Direct [facility]ial [phone]

## 2022-06-26 ENCOUNTER — Inpatient Hospital Stay: Payer: BC Managed Care – PPO

## 2022-06-26 ENCOUNTER — Encounter: Payer: Self-pay | Admitting: Family Medicine

## 2022-06-26 ENCOUNTER — Encounter: Payer: Self-pay | Admitting: *Deleted

## 2022-06-26 ENCOUNTER — Inpatient Hospital Stay: Payer: BC Managed Care – PPO | Attending: Oncology

## 2022-06-26 DIAGNOSIS — D693 Immune thrombocytopenic purpura: Secondary | ICD-10-CM | POA: Diagnosis present

## 2022-06-26 LAB — CBC WITH DIFFERENTIAL/PLATELET
Abs Immature Granulocytes: 0.05 10*3/uL (ref 0.00–0.07)
Basophils Absolute: 0 10*3/uL (ref 0.0–0.1)
Basophils Relative: 0 %
Eosinophils Absolute: 0.3 10*3/uL (ref 0.0–0.5)
Eosinophils Relative: 3 %
HCT: 47.4 % — ABNORMAL HIGH (ref 36.0–46.0)
Hemoglobin: 16.2 g/dL — ABNORMAL HIGH (ref 12.0–15.0)
Immature Granulocytes: 1 %
Lymphocytes Relative: 28 %
Lymphs Abs: 2.6 10*3/uL (ref 0.7–4.0)
MCH: 33.8 pg (ref 26.0–34.0)
MCHC: 34.2 g/dL (ref 30.0–36.0)
MCV: 98.8 fL (ref 80.0–100.0)
Monocytes Absolute: 1.1 10*3/uL — ABNORMAL HIGH (ref 0.1–1.0)
Monocytes Relative: 12 %
Neutro Abs: 5.2 10*3/uL (ref 1.7–7.7)
Neutrophils Relative %: 56 %
Platelets: 24 10*3/uL — CL (ref 150–400)
RBC: 4.8 MIL/uL (ref 3.87–5.11)
RDW: 13.2 % (ref 11.5–15.5)
WBC: 9.3 10*3/uL (ref 4.0–10.5)
nRBC: 0 % (ref 0.0–0.2)

## 2022-06-26 MED ORDER — ROMIPLOSTIM INJECTION 500 MCG
560.0000 ug | Freq: Once | SUBCUTANEOUS | Status: AC
  Administered 2022-06-26: 560 ug via SUBCUTANEOUS
  Filled 2022-06-26: qty 0.25

## 2022-06-27 NOTE — Telephone Encounter (Signed)
Noted  

## 2022-06-27 NOTE — Telephone Encounter (Signed)
Patient is scheduled for ED FU for 07/10/22 at 3:00

## 2022-06-29 ENCOUNTER — Encounter: Payer: Self-pay | Admitting: Podiatry

## 2022-07-03 ENCOUNTER — Other Ambulatory Visit: Payer: Self-pay | Admitting: Podiatry

## 2022-07-03 MED ORDER — MELOXICAM 15 MG PO TABS: 15.0000 mg | ORAL_TABLET | Freq: Every day | ORAL | 0 refills | Status: DC

## 2022-07-03 MED ORDER — ONDANSETRON HCL 4 MG PO TABS: 4.0000 mg | ORAL_TABLET | Freq: Three times a day (TID) | ORAL | 0 refills | Status: DC | PRN

## 2022-07-03 MED ORDER — METHYLPREDNISOLONE 4 MG PO TBPK: ORAL_TABLET | ORAL | 0 refills | Status: DC

## 2022-07-10 ENCOUNTER — Ambulatory Visit: Payer: BC Managed Care – PPO | Admitting: Family Medicine

## 2022-07-11 ENCOUNTER — Other Ambulatory Visit: Payer: Self-pay | Admitting: Pulmonary Disease

## 2022-07-15 ENCOUNTER — Telehealth: Payer: BC Managed Care – PPO | Admitting: Urgent Care

## 2022-07-15 DIAGNOSIS — H65112 Acute and subacute allergic otitis media (mucoid) (sanguinous) (serous), left ear: Secondary | ICD-10-CM

## 2022-07-15 DIAGNOSIS — J0100 Acute maxillary sinusitis, unspecified: Secondary | ICD-10-CM

## 2022-07-15 MED ORDER — AMOXICILLIN-POT CLAVULANATE 875-125 MG PO TABS: 1.0000 | ORAL_TABLET | Freq: Two times a day (BID) | ORAL | 0 refills | Status: DC

## 2022-07-15 NOTE — Progress Notes (Signed)
E-Visit for Ear Pain - Acute Otitis Media   We are sorry that you are not feeling well. Here is how we plan to help!  Based on what you have shared with me it looks like you have Acute Otitis Media.  Acute Otitis Media is an infection of the middle or "inner" ear. This type of infection can cause redness, inflammation, and fluid buildup behind the tympanic membrane (ear drum).  The usual symptoms include: Earache/Pain Fever Upper respiratory symptoms Lack of energy/Fatigue/Malaise Slight hearing loss gradually worsening- if the inner ear fills with fluid What causes middle ear infections? Most middle ear infections occur when an infection such as a cold, leads to a build-up of mucus in the middle ear and causes the Eustachian tube (a thin tube that runs from the middle ear to the back of the nose) to become swollen or blocked.   This means mucus can't drain away properly, making it easier for an infection to spread into the middle ear.  How middle ear infections are treated: Most ear infections clear up within three to five days and don't need any specific treatment. If necessary, tylenol or ibuprofen should be used to relieve pain and a high temperature.  If you develop a fever higher than 102, or any significantly worsening symptoms, this could indicate a more serious infection moving to the middle/inner and needs face to face evaluation in an office by a provider.   Antibiotics aren't routinely used to treat middle ear infections, although they may occasionally be prescribed if symptoms persist or are particularly severe. Given your presentation,   I have prescribed Augmentin 875-125 mg one tablet by mouth twice a day for 10 days   Your symptoms should improve over the next 3 days and should resolve in about 7 days. Be sure to complete ALL of the prescription(s) given.  HOME CARE: Wash your hands frequently. If you are prescribed an ear drop, do not place the tip of the bottle on your  ear or touch it with your fingers. You can take Acetaminophen 650 mg every 4-6 hours as needed for pain.  If pain is severe or moderate, you can apply a heating pad (set on low) or hot water bottle (wrapped in a towel) to outer ear for 20 minutes.  This will also increase drainage.  GET HELP RIGHT AWAY IF: Fever is over 102.2 degrees. You develop progressive ear pain or hearing loss. Ear symptoms persist longer than 3 days after treatment.  MAKE SURE YOU: Understand these instructions. Will watch your condition. Will get help right away if you are not doing well or get worse.  Thank you for choosing an e-visit.  Your e-visit answers were reviewed by a board certified advanced clinical practitioner to complete your personal care plan. Depending upon the condition, your plan could have included both over the counter or prescription medications.  Please review your pharmacy choice. Make sure the pharmacy is open so you can pick up the prescription now. If there is a problem, you may contact your provider through [employer] and have the prescription routed to another pharmacy.  Your safety is important to [place]. If you have drug allergies check your prescription carefully.   For the next 24 hours you can use MyChart to ask questions about today's visit, request a non-urgent call back, or ask for a work or school excuse. You will get an email with a survey after your eVisit asking about your experience. We would appreciate your  feedback. I hope that your e-visit has been valuable and will aid in your recovery.    I have spent 5 minutes in review of e-visit questionnaire, review and updating patient chart, medical decision making and response to patient.   [employer], [place]

## 2022-07-18 ENCOUNTER — Ambulatory Visit: Payer: BC Managed Care – PPO | Attending: Pulmonary Disease

## 2022-07-25 ENCOUNTER — Other Ambulatory Visit: Payer: Self-pay | Admitting: Pulmonary Disease

## 2022-07-30 ENCOUNTER — Encounter: Payer: BC Managed Care – PPO | Admitting: Family Medicine

## 2022-07-30 ENCOUNTER — Other Ambulatory Visit: Payer: Self-pay | Admitting: Podiatry

## 2022-07-30 DIAGNOSIS — F32A Depression, unspecified: Secondary | ICD-10-CM

## 2022-07-30 DIAGNOSIS — K219 Gastro-esophageal reflux disease without esophagitis: Secondary | ICD-10-CM

## 2022-07-30 DIAGNOSIS — E559 Vitamin D deficiency, unspecified: Secondary | ICD-10-CM

## 2022-07-31 NOTE — Telephone Encounter (Signed)
Please advise 

## 2022-08-03 ENCOUNTER — Ambulatory Visit: Payer: BC Managed Care – PPO | Admitting: Podiatry

## 2022-08-03 ENCOUNTER — Encounter: Payer: Self-pay | Admitting: Podiatry

## 2022-08-03 VITALS — BP 132/74

## 2022-08-03 DIAGNOSIS — M722 Plantar fascial fibromatosis: Secondary | ICD-10-CM

## 2022-08-03 MED ORDER — CELECOXIB 50 MG PO CAPS: 50.0000 mg | ORAL_CAPSULE | Freq: Two times a day (BID) | ORAL | 0 refills | Status: DC

## 2022-08-03 MED ORDER — METHYLPREDNISOLONE 4 MG PO TBPK: ORAL_TABLET | ORAL | 0 refills | Status: DC

## 2022-08-03 NOTE — Progress Notes (Signed)
Subjective:  Patient ID: Carly [name], female    DOB: August 06, 1970,  MRN: [id number]  Chief Complaint  Patient presents with   Plantar Fasciitis    Pt stated that her foot is worse she has been having a lot of swelling and inflammation in the heel the boot did help with the pain in the arch and top of foot.      52 y.o. female presents with the above complaint.  Patient presents for follow-up of left Planter fasciitis.  She states the injection did not help.  She would like to discuss physical therapy.  She does not want to do surgery.   Review of Systems: Negative except as noted in the HPI. Denies N/V/F/Ch.  Past Medical History:  Diagnosis Date   Abnormal uterine bleeding (AUB) 08/26/2018   Anxiety    C. difficile diarrhea    07/04/21   Chicken pox    Depression    Depression, major, single episode, moderate ([place]) 08/04/2018   Diarrhea 06/07/2022   Dyspnea on exertion 05/24/2022   Elevated testosterone level in female    Emphysema of lung (HCC)    bronchitis   Frequent headaches    Generalized anxiety disorder 08/23/2014   Hay fever    Hypertension    Idiopathic thrombocytopenic purpura (ITP) (HCC)    Iron deficiency 09/02/2017   Positive ANA (antinuclear antibody)    Thrombocytopenia (HCC) 02/12/2016    Current Outpatient Medications:    methylPREDNISolone (MEDROL DOSEPAK) 4 MG TBPK tablet, Take as directed, Disp: 21 each, Rfl: 0   acetaminophen (TYLENOL) 500 MG tablet, Take 500 mg by mouth every 6 (six) hours as needed., Disp: , Rfl:    albuterol (PROVENTIL) (2.5 MG/3ML) 0.083% nebulizer solution, Take 3 mLs (2.5 mg total) by nebulization every 6 (six) hours as needed for wheezing or shortness of breath., Disp: 360 mL, Rfl: 1   albuterol (VENTOLIN HFA) 108 (90 Base) MCG/ACT inhaler, INHALE 2 PUFFS INTO THE LUNGS EVERY 6 HOURS AS NEEDED FOR WHEEZING OR SHORTNESS OF BREATH (COUGH), Disp: 6.7 each, Rfl: 3   ALPRAZolam (XANAX) 0.25 MG tablet, Take 1 tablet (0.25 mg total)  by mouth daily as needed for anxiety., Disp: 30 tablet, Rfl: 2   amLODipine (NORVASC) 10 MG tablet, Take 1 tablet (10 mg total) by mouth daily., Disp: 90 tablet, Rfl: 3   ARIPiprazole (ABILIFY) 2 MG tablet, Take 1 tablet (2 mg total) by mouth daily. Dc rexulti not covered, Disp: 90 tablet, Rfl: 3   BREZTRI AEROSPHERE 160-9-4.8 MCG/ACT AERO, INHALE 2 PUFFS INTO THE LUNGS IN THE MORNING AND AT BEDTIME., Disp: 10.7 each, Rfl: 5   carvedilol (COREG) 25 MG tablet, Take 1 tablet (25 mg total) by mouth 2 (two) times daily with a meal., Disp: 180 tablet, Rfl: 3   Cholecalciferol 1.25 MG (50000 UT) capsule, Take 1 capsule (50,000 Units total) by mouth every 30 (thirty) days. 1x per month note this, Disp: 13 capsule, Rfl: 0   cyanocobalamin (VITAMIN B12) 1000 MCG/ML injection, INJECT 1 ML (1,000 MCG TOTAL) INTO THE MUSCLE EVERY 30 DAYS., Disp: 1 mL, Rfl: 15   diclofenac Sodium (VOLTAREN) 1 % GEL, Apply 2-4 g topically 4 (four) times daily. 2 grams upper body qid prn and 4 gram lower body qid prn, Disp: 150 g, Rfl: 11   escitalopram (LEXAPRO) 20 MG tablet, TAKE 1 TABLET BY MOUTH EVERY DAY, Disp: 90 tablet, Rfl: 4   fluticasone (FLONASE) 50 MCG/ACT nasal spray, SPRAY 1 SPRAY INTO BOTH NOSTRILS  DAILY., Disp: 16 mL, Rfl: 2   levocetirizine (XYZAL) 5 MG tablet, Take 1 tablet (5 mg total) by mouth every evening., Disp: 30 tablet, Rfl: 0   omeprazole (PRILOSEC) 20 MG capsule, TAKE 1 CAPSULE BY MOUTH EVERY DAY, Disp: 90 capsule, Rfl: 3   traZODone (DESYREL) 50 MG tablet, TAKE 0.5-1 TABLETS BY MOUTH AT BEDTIME AS NEEDED FOR SLEEP., Disp: 90 tablet, Rfl: 3  Social History   Tobacco Use  Smoking Status Every Day   Packs/day: 1.50   Years: 28.00   Total pack years: 42.00   Types: Cigarettes  Smokeless Tobacco Never  Tobacco Comments   1.5 ppd increase d/t death of sister.  2023-04-06 hfb   0.5 PPD 05/24/2022-khj    Allergies  Allergen Reactions   Wellbutrin [Bupropion]     Crazy    Meloxicam Nausea Only    Objective:   Vitals:   08/03/22 0833  BP: 132/74   There is no height or weight on file to calculate BMI. Constitutional Well developed. Well nourished.  Vascular Dorsalis pedis pulses palpable bilaterally. Posterior tibial pulses palpable bilaterally. Capillary refill normal to all digits.  No cyanosis or clubbing noted. Pedal hair growth normal.  Neurologic Normal speech. Oriented to person, place, and time. Epicritic sensation to light touch grossly present bilaterally.  Dermatologic Nails well groomed and normal in appearance. No open wounds. No skin lesions.  Orthopedic: Normal joint ROM without pain or crepitus bilaterally. No visible deformities. Tender to palpation at the calcaneal tuber left No pain with calcaneal squeeze bilaterally. Ankle ROM diminished range of motion bilaterally. Silfverskiold Test: positive bilaterally.   Radiographs: Taken and reviewed. No acute fractures or dislocations. No evidence of stress fracture.  Plantar heel spur present. Posterior heel spur absent.   Assessment:   No diagnosis found.    Plan:  Patient was evaluated and treated and all questions answered.  Plantar Fasciitis, left~recurrence - XR reviewed as above.  - Educated on icing and stretching. Instructions given.  -We will hold off on injection as they have not helped - DME: Cam boot was dispensed.  Will undergo a cam boot immobilization - Pharmacologic management: None -Patient will benefit from physical therapy physical therapy prescription was given.  Will continue conservative care for now  Pes planovalgus -I explained to patient the etiology of pes planovalgus and relationship with Planter fasciitis and various treatment options were discussed.  Given patient foot structure in the setting of Planter fasciitis I believe patient will benefit from custom-made orthotics to help control the hindfoot motion support the arch of the foot and take the stress away from  plantar fascial.  Patient agrees with the plan like to proceed with orthotics -Orthotics were dispensed and are functioning well    No follow-ups on file.  Will do physical therapy on to focus on conservative care.

## 2022-08-05 NOTE — Patient Instructions (Addendum)
It was a pleasure meeting you today. Thank you for allowing me to take part in your health care.  Our goals for today as we discussed include:  Refill sent for Vitamin B 12 and Vitamin D   Referral sent for Mammogram. Please call to schedule appointment. [facility], [place] [address] [phone]   You are due for colon cancer screening.  I have placed an order for Cologuard that you will receive in the mail.    If you have any questions or concerns, please do not hesitate to call the office at [phone].  I look forward to our next visit and until then take care and stay safe.  Regards,   Carollee Leitz, MD   [facility]

## 2022-08-06 ENCOUNTER — Encounter: Payer: Self-pay | Admitting: Family Medicine

## 2022-08-06 ENCOUNTER — Ambulatory Visit: Payer: BC Managed Care – PPO | Admitting: Family Medicine

## 2022-08-06 ENCOUNTER — Encounter: Payer: Self-pay | Admitting: *Deleted

## 2022-08-06 ENCOUNTER — Other Ambulatory Visit: Payer: Self-pay

## 2022-08-06 VITALS — BP 170/96 | HR 93 | Temp 98.6°F | Ht 62.0 in | Wt 210.6 lb

## 2022-08-06 DIAGNOSIS — F39 Unspecified mood [affective] disorder: Secondary | ICD-10-CM | POA: Diagnosis not present

## 2022-08-06 DIAGNOSIS — E559 Vitamin D deficiency, unspecified: Secondary | ICD-10-CM

## 2022-08-06 DIAGNOSIS — I700 Atherosclerosis of aorta: Secondary | ICD-10-CM | POA: Diagnosis not present

## 2022-08-06 DIAGNOSIS — Z1231 Encounter for screening mammogram for malignant neoplasm of breast: Secondary | ICD-10-CM

## 2022-08-06 DIAGNOSIS — D696 Thrombocytopenia, unspecified: Secondary | ICD-10-CM

## 2022-08-06 DIAGNOSIS — E538 Deficiency of other specified B group vitamins: Secondary | ICD-10-CM

## 2022-08-06 DIAGNOSIS — J432 Centrilobular emphysema: Secondary | ICD-10-CM | POA: Diagnosis not present

## 2022-08-06 DIAGNOSIS — Z1211 Encounter for screening for malignant neoplasm of colon: Secondary | ICD-10-CM

## 2022-08-06 DIAGNOSIS — M722 Plantar fascial fibromatosis: Secondary | ICD-10-CM

## 2022-08-06 DIAGNOSIS — D693 Immune thrombocytopenic purpura: Secondary | ICD-10-CM

## 2022-08-06 DIAGNOSIS — R7309 Other abnormal glucose: Secondary | ICD-10-CM

## 2022-08-06 DIAGNOSIS — E785 Hyperlipidemia, unspecified: Secondary | ICD-10-CM

## 2022-08-06 DIAGNOSIS — I10 Essential (primary) hypertension: Secondary | ICD-10-CM

## 2022-08-06 MED ORDER — CYANOCOBALAMIN 1000 MCG/ML IJ SOLN: INTRAMUSCULAR | 15 refills | Status: DC

## 2022-08-06 NOTE — Progress Notes (Unsigned)
SUBJECTIVE:   Chief Complaint  Patient presents with   Establish Care    Transfer of Care   HPI Patient presents to clinic to transfer care  No acute concerns today.  Chronic ITP Doing well.  Denies any symptoms of bleeding.  Follows with oncology.  Receiving Nplate infusions.  Hypertension Asymptomatic.  Currently takes Norvasc 10 mg daily, carvedilol 25 mg twice daily.  Denies any visual changes, chest pain, shortness of breath, lower extremity edema.  Mood disorder Doing well.  Has never been evaluated by psychiatry.  Takes Xanax 0.25 mg at night for sleep, on Abilify 2 mg daily and Lexapro 20 mg daily.  Asthma/COPD Worsening shortness of breath.  Continuous wheezing.  Takes for [employer] daily.  No increased use of albuterol rescue therapy.  Follows with pulmonology.  Continued tobacco use.  PERTINENT PMH / PSH: Hypertension Mood disorder Chronic ITP   OBJECTIVE:  BP (!) 170/96   Pulse 93   Temp 98.6 F (37 C)   Ht '5\' 2"'$  (1.575 m)   Wt 210 lb 9.6 oz (95.5 kg)   SpO2 94%   BMI 38.52 kg/m    Physical Exam Vitals reviewed.  Constitutional:      General: She is not in acute distress.    Appearance: She is not ill-appearing.  HENT:     Head: Normocephalic.     Nose: Nose normal.  Eyes:     Conjunctiva/sclera: Conjunctivae normal.  Cardiovascular:     Rate and Rhythm: Normal rate and regular rhythm.     Heart sounds: Normal heart sounds.  Pulmonary:     Effort: Pulmonary effort is normal.     Breath sounds: Normal breath sounds.  Abdominal:     General: Abdomen is flat. Bowel sounds are normal.     Palpations: Abdomen is soft.  Musculoskeletal:        General: Normal range of motion.     Cervical back: Normal range of motion.  Neurological:     Mental Status: She is alert and oriented to person, place, and time. Mental status is at baseline.  Psychiatric:        Mood and Affect: Mood normal.        Behavior: Behavior normal.        Thought Content:  Thought content normal.        Judgment: Judgment normal.     ASSESSMENT/PLAN:  Mood disorder (HCC) Assessment & Plan: Chronic.  Stable.  History of anxiety and depression.  Denies any SI/HI Continue Xanax 0.25 mg at night Continue Abilify 2 mg daily Continue Lexapro 20 mg daily Encourage patient to consider psychiatric evaluation to optimize medication management.    Aortic atherosclerosis (HCC) Assessment & Plan: Chronic.  Stable.  Noted on CT chest 05/2021. Not currently on statin therapy. Repeat fasting lipids Will discuss statin therapy with patient at next visit.   Centrilobular emphysema ([place]) Assessment & Plan: Chronic.  Stable.  Noted on CT chest 05/2021. Continue to follow with pulmonology.   Chronic ITP (idiopathic thrombocytopenic purpura) (HCC) Assessment & Plan: Chronic.  Stable. Follows with oncology  Orders: -     CBC with Differential/Platelet; Future  B12 deficiency Assessment & Plan: Chronic.  Stable. Currently takes vitamin B-12 injections monthly.   Consider sublingual vitamin B-12 supplementation Repeat vitamin B12 levels   Orders: -     Vitamin B12; Future -     Cyanocobalamin; INJECT 1 ML (1,000 MCG TOTAL) INTO THE MUSCLE EVERY 30 DAYS.  Dispense: 1 mL; Refill: 15  Breast cancer screening by mammogram -     3D Screening Mammogram, Left and Right; Future  Colon cancer screening -     Cologuard  Primary hypertension Assessment & Plan: Chronic.  Asymptomatic.  Blood pressure remains elevated.  Recently started prednisone for plan of fasciitis.  Suspect will decrease with taper dosing. Continue carvedilol 25 mg twice daily Continue amlodipine 10 mg daily Monitor blood pressure at home.  Goal less than 130/80. Follow-up 1 week in RN clinic for blood pressure check  Orders: -     Comprehensive metabolic panel; Future -     TSH; Future  Hyperlipidemia, unspecified hyperlipidemia type -     Lipid panel; Future  Vitamin D  deficiency -     VITAMIN D 25 Hydroxy (Vit-D Deficiency, Fractures); Future  Abnormal glucose -     Hemoglobin A1c; Future  Plantar fasciitis, bilateral Assessment & Plan: Currently on prednisone taper pack Follows with podiatry    PDMP reviewed  Return for annual visit with fasting labs 1 week prior.  Carollee Leitz, MD

## 2022-08-07 ENCOUNTER — Inpatient Hospital Stay: Payer: BC Managed Care – PPO

## 2022-08-07 ENCOUNTER — Inpatient Hospital Stay: Payer: BC Managed Care – PPO | Attending: Oncology

## 2022-08-07 ENCOUNTER — Encounter: Payer: Self-pay | Admitting: Oncology

## 2022-08-07 ENCOUNTER — Inpatient Hospital Stay (HOSPITAL_BASED_OUTPATIENT_CLINIC_OR_DEPARTMENT_OTHER): Payer: BC Managed Care – PPO | Admitting: Oncology

## 2022-08-07 VITALS — BP 143/77 | HR 82 | Temp 98.4°F | Resp 16 | Ht 62.0 in | Wt 212.0 lb

## 2022-08-07 DIAGNOSIS — D693 Immune thrombocytopenic purpura: Secondary | ICD-10-CM | POA: Diagnosis present

## 2022-08-07 LAB — CBC WITH DIFFERENTIAL/PLATELET
Abs Immature Granulocytes: 0.04 10*3/uL (ref 0.00–0.07)
Basophils Absolute: 0 10*3/uL (ref 0.0–0.1)
Basophils Relative: 0 %
Eosinophils Absolute: 0 10*3/uL (ref 0.0–0.5)
Eosinophils Relative: 0 %
HCT: 44.2 % (ref 36.0–46.0)
Hemoglobin: 15.2 g/dL — ABNORMAL HIGH (ref 12.0–15.0)
Immature Granulocytes: 1 %
Lymphocytes Relative: 15 %
Lymphs Abs: 1.2 10*3/uL (ref 0.7–4.0)
MCH: 34.2 pg — ABNORMAL HIGH (ref 26.0–34.0)
MCHC: 34.4 g/dL (ref 30.0–36.0)
MCV: 99.5 fL (ref 80.0–100.0)
Monocytes Absolute: 0.5 10*3/uL (ref 0.1–1.0)
Monocytes Relative: 6 %
Neutro Abs: 6.3 10*3/uL (ref 1.7–7.7)
Neutrophils Relative %: 78 %
Platelets: 38 10*3/uL — ABNORMAL LOW (ref 150–400)
RBC: 4.44 MIL/uL (ref 3.87–5.11)
RDW: 14 % (ref 11.5–15.5)
WBC: 8 10*3/uL (ref 4.0–10.5)
nRBC: 0 % (ref 0.0–0.2)

## 2022-08-07 MED ORDER — ROMIPLOSTIM INJECTION 500 MCG
6.0000 ug/kg | Freq: Once | SUBCUTANEOUS | Status: AC
  Administered 2022-08-07: 575 ug via SUBCUTANEOUS
  Filled 2022-08-07: qty 1

## 2022-08-07 NOTE — Progress Notes (Signed)
[facility]  [phone] [phone]  ID: Carly [name] OB: 1971-04-24  MR#: [id number]  IRC#:[id number]  Patient Care Team: Carollee Leitz, MD as PCP - General (Family Medicine) Lloyd Huger, MD as Consulting Physician (Oncology) Tyler Pita, MD as Consulting Physician (Pulmonary Disease)   CHIEF COMPLAINT: ITP.  INTERVAL HISTORY: Patient returns to clinic today for repeat laboratory work, further evaluation, and continuation of Nplate.  She currently feels well and is asymptomatic.  She does not complain of any increased bleeding or bruising today. She has no neurologic complaints.  She denies any recent fevers or illnesses.  She has a good appetite and denies weight loss.  She denies any chest pain, shortness of breath, cough, or hemoptysis. She denies any nausea, vomiting, constipation, or diarrhea.  She has no urinary complaints.  Patient offers no specific complaints today.  REVIEW OF SYSTEMS:   Review of Systems  Constitutional: Negative.  Negative for fever, malaise/fatigue and weight loss.  Respiratory: Negative.  Negative for cough, hemoptysis and shortness of breath.   Cardiovascular: Negative.  Negative for chest pain and leg swelling.  Gastrointestinal: Negative.  Negative for abdominal pain, blood in stool and melena.  Genitourinary: Negative.  Negative for hematuria.  Musculoskeletal: Negative.  Negative for back pain.  Skin: Negative.  Negative for rash.  Neurological: Negative.  Negative for dizziness, focal weakness, weakness and headaches.  Endo/Heme/Allergies: Negative.  Does not bruise/bleed easily.  Psychiatric/Behavioral: Negative.  The patient is not nervous/anxious.     As per HPI. Otherwise, a complete review of systems is negative.  PAST MEDICAL HISTORY: Past Medical History:  Diagnosis Date   Anxiety    C. difficile diarrhea    07/04/21   Chicken pox    Depression    Elevated testosterone level in  female    Emphysema of lung (HCC)    bronchitis   Frequent headaches    Hay fever    Hypertension    Idiopathic thrombocytopenic purpura (ITP) (HCC)    Positive ANA (antinuclear antibody)     PAST SURGICAL HISTORY: Past Surgical History:  Procedure Laterality Date   HEMATOMA EVACUATION N/A 04/07/2020   Procedure: EVACUATION HEMATOMA  AND APPLICATION OF PRESSURE DRESSING;  Surgeon: Benjaman Kindler, MD;  Location: [facility] ORS;  Service: Gynecology;  Laterality: N/A;   HYSTEROSCOPY WITH NOVASURE N/A 02/21/2018   Procedure: HYSTEROSCOPY WITH NOVASURE, POLYPECTOMY;  Surgeon: Benjaman Kindler, MD;  Location: [facility] OR[facility];  Service: Gynecology;  Laterality: N/A;   TUBAL LIGATION  1997   TUBAL LIGATION      FAMILY HISTORY: Family History  Problem Relation Age of Onset   Hyperlipidemia Mother    Heart disease Mother    Stroke Mother    Depression Mother    Mental illness Mother    Heart attack Mother 14   Hyperlipidemia Father    Depression Father    Mental illness Father    Diabetes Paternal Grandfather    Cancer Cousin     ADVANCED DIRECTIVES (Y/N):  N  HEALTH MAINTENANCE: Social History   Tobacco Use   Smoking status: Every Day    Packs/day: 1.50    Years: 28.00    Total pack years: 42.00    Types: Cigarettes   Smokeless tobacco: Never   Tobacco comments:    1.5 ppd increase d/t death of sister.  03/25/23 hfb    0.5 PPD 05/24/2022-khj  Vaping Use   Vaping Use: Never used  Substance Use Topics  Alcohol use: Yes    Alcohol/week: 8.0 standard drinks of alcohol    Types: 8 Standard drinks or equivalent per week    Comment: Beer (Can)    Drug use: No    Comment: CBD oil     Colonoscopy:  PAP:  Bone density:  Lipid panel:  Allergies  Allergen Reactions   Wellbutrin [Bupropion]     Crazy    Meloxicam Nausea Only    Current Outpatient Medications  Medication Sig Dispense Refill   acetaminophen (TYLENOL) 500 MG tablet Take 500 mg by mouth every 6 (six) hours as  needed.     albuterol (PROVENTIL) (2.5 MG/3ML) 0.083% nebulizer solution Take 3 mLs (2.5 mg total) by nebulization every 6 (six) hours as needed for wheezing or shortness of breath. 360 mL 1   albuterol (VENTOLIN HFA) 108 (90 Base) MCG/ACT inhaler INHALE 2 PUFFS INTO THE LUNGS EVERY 6 HOURS AS NEEDED FOR WHEEZING OR SHORTNESS OF BREATH (COUGH) 6.7 each 3   ALPRAZolam (XANAX) 0.25 MG tablet Take 1 tablet (0.25 mg total) by mouth daily as needed for anxiety. 30 tablet 2   amLODipine (NORVASC) 10 MG tablet Take 1 tablet (10 mg total) by mouth daily. 90 tablet 3   ARIPiprazole (ABILIFY) 2 MG tablet Take 1 tablet (2 mg total) by mouth daily. Dc rexulti not covered 90 tablet 3   BREZTRI AEROSPHERE 160-9-4.8 MCG/ACT AERO INHALE 2 PUFFS INTO THE LUNGS IN THE MORNING AND AT BEDTIME. 10.7 each 5   carvedilol (COREG) 25 MG tablet Take 1 tablet (25 mg total) by mouth 2 (two) times daily with a meal. 180 tablet 3   Cholecalciferol 1.25 MG (50000 UT) capsule Take 1 capsule (50,000 Units total) by mouth every 30 (thirty) days. 1x per month note this 13 capsule 0   cyanocobalamin (VITAMIN B12) 1000 MCG/ML injection INJECT 1 ML (1,000 MCG TOTAL) INTO THE MUSCLE EVERY 30 DAYS. 1 mL 15   diclofenac Sodium (VOLTAREN) 1 % GEL Apply 2-4 g topically 4 (four) times daily. 2 grams upper body qid prn and 4 gram lower body qid prn 150 g 11   escitalopram (LEXAPRO) 20 MG tablet TAKE 1 TABLET BY MOUTH EVERY DAY 90 tablet 4   fluticasone (FLONASE) 50 MCG/ACT nasal spray SPRAY 1 SPRAY INTO BOTH NOSTRILS DAILY. 16 mL 2   levocetirizine (XYZAL) 5 MG tablet Take 1 tablet (5 mg total) by mouth every evening. 30 tablet 0   methylPREDNISolone (MEDROL DOSEPAK) 4 MG TBPK tablet Take as directed 21 each 0   omeprazole (PRILOSEC) 20 MG capsule TAKE 1 CAPSULE BY MOUTH EVERY DAY 90 capsule 3   traZODone (DESYREL) 50 MG tablet TAKE 0.5-1 TABLETS BY MOUTH AT BEDTIME AS NEEDED FOR SLEEP. 90 tablet 3   No current facility-administered  medications for this visit.   Facility-Administered Medications Ordered in Other Visits  Medication Dose Route Frequency Provider Last Rate Last Admin   romiPLOStim (NPLATE) injection 575 mcg  6 mcg/kg Subcutaneous Once Verlon Au, NP        OBJECTIVE: Vitals:   08/07/22 1354  BP: (!) 143/77  Pulse: 82  Resp: 16  Temp: 98.4 F (36.9 C)  SpO2: 99%     Body mass index is 38.78 kg/m.    ECOG FS:0 - Asymptomatic  General: Well-developed, well-nourished, no acute distress. Eyes: Pink conjunctiva, anicteric sclera. HEENT: Normocephalic, moist mucous membranes. Lungs: No audible wheezing or coughing. Heart: Regular rate and rhythm. Abdomen: Soft, nontender, no obvious distention. Musculoskeletal:  No edema, cyanosis, or clubbing. Neuro: Alert, answering all questions appropriately. Cranial nerves grossly intact. Skin: No rashes or petechiae noted. Psych: Normal affect.  LAB RESULTS:  Lab Results  Component Value Date   NA 138 05/24/2022   K 4.0 05/24/2022   CL 102 05/24/2022   CO2 28 05/24/2022   GLUCOSE 99 05/24/2022   BUN 7 05/24/2022   CREATININE 0.53 05/24/2022   CALCIUM 8.6 05/24/2022   PROT 6.2 05/24/2022   ALBUMIN 3.9 05/24/2022   AST 55 (H) 05/24/2022   ALT 34 05/24/2022   ALKPHOS 78 05/24/2022   BILITOT 0.5 05/24/2022   GFRNONAA >60 04/08/2021   GFRAA >60 04/08/2020    Lab Results  Component Value Date   WBC 8.0 08/07/2022   NEUTROABS 6.3 08/07/2022   HGB 15.2 (H) 08/07/2022   HCT 44.2 08/07/2022   MCV 99.5 08/07/2022   PLT 38 (L) 08/07/2022     STUDIES: No results found.  ASSESSMENT: ITP  PLAN:    1.  ITP: Confirmed with normal bone marrow biopsy and normal FISH and cytogenetics on July 22, 2019.  Patient had a positive ANA which is likely clinically insignificant.  The remainder of her laboratory work was either negative or within normal limits. CT scan on March 04, 2017 did not reveal splenomegaly.  Previously, patient received 1 week  of prednisone at 1 mg/kg dose and had no appreciable change in her platelet count.  Rituxan was denied by insurance.  Previously, patient received 3 mcg/kg of Nplate with her platelet count increasing from 33-287.  Patient's platelet count is essentially stable at 33 today.  Proceed with 6 mcg/kg Nplate today.  Return to clinic in 1 week for laboratory work only, in 6 weeks for laboratory work and continuation of treatment.  Patient then return to clinic in 12 weeks for laboratory work, further evaluation, and continuation of Nplate.    2.  History of positive ANA: Likely clinically insignificant.  Referral was previously sent to rheumatology.  I spent a total of 20 minutes reviewing chart data, face-to-face evaluation with the patient, counseling and coordination of care as detailed above.   Patient expressed understanding and was in agreement with this plan. She also understands that She can call clinic at any time with any questions, concerns, or complaints.    Lloyd Huger, MD   08/07/2022 2:21 PM

## 2022-08-07 NOTE — Addendum Note (Signed)
Addended by: Philipp Deputy on: 08/07/2022 03:39 PM   Modules accepted: Orders

## 2022-08-08 ENCOUNTER — Ambulatory Visit: Payer: BC Managed Care – PPO | Admitting: Pulmonary Disease

## 2022-08-08 ENCOUNTER — Encounter: Payer: Self-pay | Admitting: Family Medicine

## 2022-08-08 DIAGNOSIS — I700 Atherosclerosis of aorta: Secondary | ICD-10-CM | POA: Insufficient documentation

## 2022-08-08 DIAGNOSIS — Z1211 Encounter for screening for malignant neoplasm of colon: Secondary | ICD-10-CM | POA: Insufficient documentation

## 2022-08-08 DIAGNOSIS — F39 Unspecified mood [affective] disorder: Secondary | ICD-10-CM | POA: Insufficient documentation

## 2022-08-08 DIAGNOSIS — R7309 Other abnormal glucose: Secondary | ICD-10-CM | POA: Insufficient documentation

## 2022-08-08 DIAGNOSIS — J439 Emphysema, unspecified: Secondary | ICD-10-CM | POA: Insufficient documentation

## 2022-08-08 DIAGNOSIS — Z1231 Encounter for screening mammogram for malignant neoplasm of breast: Secondary | ICD-10-CM | POA: Insufficient documentation

## 2022-08-08 NOTE — Assessment & Plan Note (Signed)
Currently on prednisone taper pack Follows with podiatry

## 2022-08-08 NOTE — Assessment & Plan Note (Signed)
Chronic.  Asymptomatic.  Blood pressure remains elevated.  Recently started prednisone for plan of fasciitis.  Suspect will decrease with taper dosing. Continue carvedilol 25 mg twice daily Continue amlodipine 10 mg daily Monitor blood pressure at home.  Goal less than 130/80. Follow-up 1 week in RN clinic for blood pressure check

## 2022-08-08 NOTE — Assessment & Plan Note (Signed)
Chronic.  Stable. Currently takes vitamin B-12 injections monthly.   Consider sublingual vitamin B-12 supplementation Repeat vitamin B12 levels

## 2022-08-08 NOTE — Assessment & Plan Note (Signed)
Chronic.  Stable.  Noted on CT chest 05/2021. Continue to follow with pulmonology.

## 2022-08-08 NOTE — Assessment & Plan Note (Signed)
Chronic.  Stable.  History of anxiety and depression.  Denies any SI/HI Continue Xanax 0.25 mg at night Continue Abilify 2 mg daily Continue Lexapro 20 mg daily Encourage patient to consider psychiatric evaluation to optimize medication management.

## 2022-08-08 NOTE — Assessment & Plan Note (Signed)
Chronic.  Stable.   Follows with oncology. 

## 2022-08-08 NOTE — Assessment & Plan Note (Signed)
Chronic.  Stable.  Noted on CT chest 05/2021. Not currently on statin therapy. Repeat fasting lipids Will discuss statin therapy with patient at next visit.

## 2022-08-14 ENCOUNTER — Inpatient Hospital Stay: Payer: BC Managed Care – PPO

## 2022-08-14 DIAGNOSIS — D693 Immune thrombocytopenic purpura: Secondary | ICD-10-CM | POA: Diagnosis not present

## 2022-08-14 LAB — CBC WITH DIFFERENTIAL/PLATELET
Abs Immature Granulocytes: 0.05 10*3/uL (ref 0.00–0.07)
Basophils Absolute: 0.1 10*3/uL (ref 0.0–0.1)
Basophils Relative: 1 %
Eosinophils Absolute: 0.3 10*3/uL (ref 0.0–0.5)
Eosinophils Relative: 4 %
HCT: 45.2 % (ref 36.0–46.0)
Hemoglobin: 15.5 g/dL — ABNORMAL HIGH (ref 12.0–15.0)
Immature Granulocytes: 1 %
Lymphocytes Relative: 21 %
Lymphs Abs: 2 10*3/uL (ref 0.7–4.0)
MCH: 34.8 pg — ABNORMAL HIGH (ref 26.0–34.0)
MCHC: 34.3 g/dL (ref 30.0–36.0)
MCV: 101.6 fL — ABNORMAL HIGH (ref 80.0–100.0)
Monocytes Absolute: 1.1 10*3/uL — ABNORMAL HIGH (ref 0.1–1.0)
Monocytes Relative: 12 %
Neutro Abs: 5.8 10*3/uL (ref 1.7–7.7)
Neutrophils Relative %: 61 %
Platelets: 191 10*3/uL (ref 150–400)
RBC: 4.45 MIL/uL (ref 3.87–5.11)
RDW: 14.5 % (ref 11.5–15.5)
Smear Review: ADEQUATE
WBC: 9.4 10*3/uL (ref 4.0–10.5)
nRBC: 0 % (ref 0.0–0.2)

## 2022-08-23 ENCOUNTER — Ambulatory Visit: Payer: BC Managed Care – PPO

## 2022-08-30 ENCOUNTER — Ambulatory Visit: Payer: BC Managed Care – PPO

## 2022-08-30 VITALS — BP 136/76 | HR 86

## 2022-08-30 DIAGNOSIS — I10 Essential (primary) hypertension: Secondary | ICD-10-CM

## 2022-08-30 NOTE — Progress Notes (Addendum)
Pt presents today for blood pressure check. Pt takes Amlodipine 10 mg and Carvedilol 25 mg BID. Last dose of Amlodipine was at lunch and last dose of Carvedilol was this AM. Pt is aware that she is to take the second dose of Carvedilol this evening. BP today in the left arm is 150/70 pulse 90 and right arm 150/80 pulse 94. Pt was asked if she is having any symptoms and she stated that she has been dizzy some for the past week and that she occasionally feels a flutter in her chest but isn't sure if it is due to her COPD that is "acting up".  I let pt sit for 5 minutes and rechecked bp. It came down to 138/76 pulse 88 in the left arm and 136/76 pulse 86 in the right arm. Tomasita Morrow, NP advised that she didn't think symptoms were related to bp but recommends that she check her bp at least once a day and schedule a follow up with PCP.

## 2022-09-04 ENCOUNTER — Telehealth: Payer: BC Managed Care – PPO | Admitting: Family Medicine

## 2022-09-04 DIAGNOSIS — J069 Acute upper respiratory infection, unspecified: Secondary | ICD-10-CM

## 2022-09-04 MED ORDER — FLUTICASONE PROPIONATE 50 MCG/ACT NA SUSP: 2.0000 | Freq: Every day | NASAL | 0 refills | Status: DC

## 2022-09-04 MED ORDER — BENZONATATE 100 MG PO CAPS: 100.0000 mg | ORAL_CAPSULE | Freq: Three times a day (TID) | ORAL | 0 refills | Status: DC | PRN

## 2022-09-04 NOTE — Progress Notes (Signed)
Virtual Visit Consent   Carly [name], you are scheduled for a virtual visit with a [facility] provider today. Just as with appointments in the office, your consent must be obtained to participate. Your consent will be active for this visit and any virtual visit you may have with one of our providers in the next 365 days. If you have a MyChart account, a copy of this consent can be sent to you electronically.  As this is a virtual visit, video technology does not allow for your provider to perform a traditional examination. This may limit your provider's ability to fully assess your condition. If your provider identifies any concerns that need to be evaluated in person or the need to arrange testing (such as labs, EKG, etc.), we will make arrangements to do so. Although advances in technology are sophisticated, we cannot ensure that it will always work on either your end or our end. If the connection with a video visit is poor, the visit may have to be switched to a telephone visit. With either a video or telephone visit, we are not always able to ensure that we have a secure connection.  By engaging in this virtual visit, you consent to the provision of healthcare and authorize for your insurance to be billed (if applicable) for the services provided during this visit. Depending on your insurance coverage, you may receive a charge related to this service.  I need to obtain your verbal consent now. Are you willing to proceed with your visit today? Carly [name] has provided verbal consent on 09/04/2022 for a virtual visit (video or telephone). Carly Mayo, NP  Date: 09/04/2022 10:12 AM  Virtual Visit via Video Note   I, Carly [name], [name]  ([id number], [birth year]) on 09/04/22 at 10:30 AM EST by a video-enabled telemedicine application and verified that I am speaking with the correct person using two identifiers.  Location: Patient: Virtual Visit Location Patient:  Home Provider: Virtual Visit Location Provider: Home Office   I discussed the limitations of evaluation and management by telemedicine and the availability of in person appointments. The patient expressed understanding and agreed to proceed.    History of Present Illness: Carly [name] is a 52 y.o. who identifies as a female who was assigned female at birth, and is being seen today for sinus symptoms. Onset was Friday with headaches that progressed to sinus pressure and pain and hoarseness, sore throat, cough and congestion- mucus coughing very little- clear. Shortness of breath is at baseline-copd/smoker.  COVID negative home test x 2  Denies chest pain, ear pain.   Problems:  Patient Active Problem List   Diagnosis Date Noted   Emphysema lung ([place]) 08/08/2022   Aortic atherosclerosis ([place]) 08/08/2022   Mood disorder ([place]) 08/08/2022   Colon cancer screening 08/08/2022   Breast cancer screening by mammogram 08/08/2022   Abnormal glucose 08/08/2022   Allergic rhinitis 11/24/2021   Lumbar radiculopathy 07/11/2021   Gastroesophageal reflux disease without esophagitis 07/01/2020   Hyperlipidemia 07/01/2020   Fatty liver 12/24/2019   Angiolipoma of left kidney 12/24/2019   Chronic ITP (idiopathic thrombocytopenic purpura) ([place]) 12/02/2019   Plantar fasciitis, bilateral 09/29/2019   Osteoarthritis of both knees 09/29/2019   Overactive bladder 07/14/2019   B12 deficiency 06/10/2019   Vitamin D deficiency 06/10/2019   Insomnia 08/15/2016   Carpal tunnel syndrome 01/20/2016   Asthma-COPD overlap syndrome 01/13/2015   Female hirsutism 09/22/2014   Elevated testosterone level  in female 09/22/2014   Tobacco use disorder 08/23/2014   Obesity (BMI 30-39.9) 08/23/2014   HTN (hypertension) 08/23/2014    Allergies:  Allergies  Allergen Reactions   Wellbutrin [Bupropion]     Crazy    Meloxicam Nausea Only   Medications:  Current Outpatient Medications:    acetaminophen (TYLENOL) 500  MG tablet, Take 500 mg by mouth every 6 (six) hours as needed., Disp: , Rfl:    albuterol (PROVENTIL) (2.5 MG/3ML) 0.083% nebulizer solution, Take 3 mLs (2.5 mg total) by nebulization every 6 (six) hours as needed for wheezing or shortness of breath., Disp: 360 mL, Rfl: 1   albuterol (VENTOLIN HFA) 108 (90 Base) MCG/ACT inhaler, INHALE 2 PUFFS INTO THE LUNGS EVERY 6 HOURS AS NEEDED FOR WHEEZING OR SHORTNESS OF BREATH (COUGH), Disp: 6.7 each, Rfl: 3   ALPRAZolam (XANAX) 0.25 MG tablet, Take 1 tablet (0.25 mg total) by mouth daily as needed for anxiety., Disp: 30 tablet, Rfl: 2   amLODipine (NORVASC) 10 MG tablet, Take 1 tablet (10 mg total) by mouth daily., Disp: 90 tablet, Rfl: 3   ARIPiprazole (ABILIFY) 2 MG tablet, Take 1 tablet (2 mg total) by mouth daily. Dc rexulti not covered, Disp: 90 tablet, Rfl: 3   BREZTRI AEROSPHERE 160-9-4.8 MCG/ACT AERO, INHALE 2 PUFFS INTO THE LUNGS IN THE MORNING AND AT BEDTIME., Disp: 10.7 each, Rfl: 5   carvedilol (COREG) 25 MG tablet, Take 1 tablet (25 mg total) by mouth 2 (two) times daily with a meal., Disp: 180 tablet, Rfl: 3   Cholecalciferol 1.25 MG (50000 UT) capsule, Take 1 capsule (50,000 Units total) by mouth every 30 (thirty) days. 1x per month note this, Disp: 13 capsule, Rfl: 0   cyanocobalamin (VITAMIN B12) 1000 MCG/ML injection, INJECT 1 ML (1,000 MCG TOTAL) INTO THE MUSCLE EVERY 30 DAYS., Disp: 1 mL, Rfl: 15   diclofenac Sodium (VOLTAREN) 1 % GEL, Apply 2-4 g topically 4 (four) times daily. 2 grams upper body qid prn and 4 gram lower body qid prn, Disp: 150 g, Rfl: 11   escitalopram (LEXAPRO) 20 MG tablet, TAKE 1 TABLET BY MOUTH EVERY DAY, Disp: 90 tablet, Rfl: 4   fluticasone (FLONASE) 50 MCG/ACT nasal spray, SPRAY 1 SPRAY INTO BOTH NOSTRILS DAILY., Disp: 16 mL, Rfl: 2   levocetirizine (XYZAL) 5 MG tablet, Take 1 tablet (5 mg total) by mouth every evening., Disp: 30 tablet, Rfl: 0   methylPREDNISolone (MEDROL DOSEPAK) 4 MG TBPK tablet, Take as  directed, Disp: 21 each, Rfl: 0   omeprazole (PRILOSEC) 20 MG capsule, TAKE 1 CAPSULE BY MOUTH EVERY DAY, Disp: 90 capsule, Rfl: 3   traZODone (DESYREL) 50 MG tablet, TAKE 0.5-1 TABLETS BY MOUTH AT BEDTIME AS NEEDED FOR SLEEP., Disp: 90 tablet, Rfl: 3  Observations/Objective: Patient is well-developed, well-nourished in no acute distress.  Resting comfortably  at home.  Head is normocephalic, atraumatic.  No labored breathing.  Speech is clear and coherent with logical content.  Patient is alert and oriented at baseline.    Assessment and Plan:  1. URI with cough and congestion  - fluticasone (FLONASE) 50 MCG/ACT nasal spray; Place 2 sprays into both nostrils daily.  Dispense: 16 g; Refill: 0 - benzonatate (TESSALON) 100 MG capsule; Take 1 capsule (100 mg total) by mouth 3 (three) times daily as needed for cough.  Dispense: 20 capsule; Refill: 0    -Take meds as prescribed -Rest -Mucinex plain as needed for chest congestion -Use a cool mist humidifier especially during  the winter months when heat dries out the air. - Use saline nose sprays frequently to help soothe nasal passages and promote drainage. -Saline irrigations of the nose can be very helpful if done frequently.             * 4X daily for 1 week*             * Use of a nettie pot can be helpful with this.  *Follow directions with this* *Boiled or distilled water only -stay hydrated by drinking plenty of fluids - Keep thermostat turn down low to prevent drying out sinuses - For any cough or congestion- robitussin DM or Delsym as needed - For fever or aches or pains- take tylenol or ibuprofen as directed on bottle             * for fevers greater than 101 orally you may alternate ibuprofen and tylenol every 3 hours.  If you do not improve you will need a follow up visit in person.   Follow Up Instructions: I discussed the assessment and treatment plan with the patient. The patient was provided an opportunity to ask  questions and all were answered. The patient agreed with the plan and demonstrated an understanding of the instructions.  A copy of instructions were sent to the patient via MyChart unless otherwise noted below.    The patient was advised to call back or seek an in-person evaluation if the symptoms worsen or if the condition fails to improve as anticipated.  Time:  I spent 10 minutes with the patient via telehealth technology discussing the above problems/concerns.    Carly Mayo, NP

## 2022-09-04 NOTE — Patient Instructions (Signed)
Lin Landsman, thank you for joining Perlie Mayo, NP for today's virtual visit.  While this provider is not your primary care provider (PCP), if your PCP is located in our provider database this encounter information will be shared with them immediately following your visit.   [facility] account gives you access to today's visit and all your visits, tests, and labs performed at [facility] " click here if you don't have a [facility] account or go to [email or web address]  Consent: (Patient) Carly [name] provided verbal consent for this virtual visit at the beginning of the encounter.  Current Medications:  Current Outpatient Medications:    benzonatate (TESSALON) 100 MG capsule, Take 1 capsule (100 mg total) by mouth 3 (three) times daily as needed for cough., Disp: 20 capsule, Rfl: 0   fluticasone (FLONASE) 50 MCG/ACT nasal spray, Place 2 sprays into both nostrils daily., Disp: 16 g, Rfl: 0   acetaminophen (TYLENOL) 500 MG tablet, Take 500 mg by mouth every 6 (six) hours as needed., Disp: , Rfl:    albuterol (PROVENTIL) (2.5 MG/3ML) 0.083% nebulizer solution, Take 3 mLs (2.5 mg total) by nebulization every 6 (six) hours as needed for wheezing or shortness of breath., Disp: 360 mL, Rfl: 1   albuterol (VENTOLIN HFA) 108 (90 Base) MCG/ACT inhaler, INHALE 2 PUFFS INTO THE LUNGS EVERY 6 HOURS AS NEEDED FOR WHEEZING OR SHORTNESS OF BREATH (COUGH), Disp: 6.7 each, Rfl: 3   ALPRAZolam (XANAX) 0.25 MG tablet, Take 1 tablet (0.25 mg total) by mouth daily as needed for anxiety., Disp: 30 tablet, Rfl: 2   amLODipine (NORVASC) 10 MG tablet, Take 1 tablet (10 mg total) by mouth daily., Disp: 90 tablet, Rfl: 3   ARIPiprazole (ABILIFY) 2 MG tablet, Take 1 tablet (2 mg total) by mouth daily. Dc rexulti not covered, Disp: 90 tablet, Rfl: 3   BREZTRI AEROSPHERE 160-9-4.8 MCG/ACT AERO, INHALE 2 PUFFS INTO THE LUNGS IN THE MORNING AND AT BEDTIME., Disp: 10.7 each, Rfl: 5    carvedilol (COREG) 25 MG tablet, Take 1 tablet (25 mg total) by mouth 2 (two) times daily with a meal., Disp: 180 tablet, Rfl: 3   Cholecalciferol 1.25 MG (50000 UT) capsule, Take 1 capsule (50,000 Units total) by mouth every 30 (thirty) days. 1x per month note this, Disp: 13 capsule, Rfl: 0   cyanocobalamin (VITAMIN B12) 1000 MCG/ML injection, INJECT 1 ML (1,000 MCG TOTAL) INTO THE MUSCLE EVERY 30 DAYS., Disp: 1 mL, Rfl: 15   diclofenac Sodium (VOLTAREN) 1 % GEL, Apply 2-4 g topically 4 (four) times daily. 2 grams upper body qid prn and 4 gram lower body qid prn, Disp: 150 g, Rfl: 11   escitalopram (LEXAPRO) 20 MG tablet, TAKE 1 TABLET BY MOUTH EVERY DAY, Disp: 90 tablet, Rfl: 4   levocetirizine (XYZAL) 5 MG tablet, Take 1 tablet (5 mg total) by mouth every evening., Disp: 30 tablet, Rfl: 0   omeprazole (PRILOSEC) 20 MG capsule, TAKE 1 CAPSULE BY MOUTH EVERY DAY, Disp: 90 capsule, Rfl: 3   traZODone (DESYREL) 50 MG tablet, TAKE 0.5-1 TABLETS BY MOUTH AT BEDTIME AS NEEDED FOR SLEEP., Disp: 90 tablet, Rfl: 3   Medications ordered in this encounter:  Meds ordered this encounter  Medications   fluticasone (FLONASE) 50 MCG/ACT nasal spray    Sig: Place 2 sprays into both nostrils daily.    Dispense:  16 g    Refill:  0    Order Specific Question:  Supervising Provider    Answer:   Chase Picket [id number]   benzonatate (TESSALON) 100 MG capsule    Sig: Take 1 capsule (100 mg total) by mouth 3 (three) times daily as needed for cough.    Dispense:  20 capsule    Refill:  0    Order Specific Question:   Supervising Provider    Answer:   Chase Picket [id number]     *If you need refills on other medications prior to your next appointment, please contact your pharmacy*  Follow-Up: Call back or seek an in-person evaluation if the symptoms worsen or if the condition fails to improve as anticipated.  [facility] [phone]  Other Instructions  -Take meds as  prescribed -Rest -Mucinex plain as needed for chest congestion -Use a cool mist humidifier especially during the winter months when heat dries out the air. - Use saline nose sprays frequently to help soothe nasal passages and promote drainage. -Saline irrigations of the nose can be very helpful if done frequently.             * 4X daily for 1 week*             * Use of a nettie pot can be helpful with this.  *Follow directions with this* *Boiled or distilled water only -stay hydrated by drinking plenty of fluids - Keep thermostat turn down low to prevent drying out sinuses - For any cough or congestion- robitussin DM or Delsym as needed - For fever or aches or pains- take tylenol or ibuprofen as directed on bottle             * for fevers greater than 101 orally you may alternate ibuprofen and tylenol every 3 hours.  If you do not improve you will need a follow up visit in person.                  If you have been instructed to have an in-person evaluation today at a local Urgent Care facility, please use the link below. It will take you to a list of all of our available [facility], including address, phone number and hours of operation. Please do not delay care.  [facility]  If you or a family member do not have a primary care provider, use the link below to schedule a visit and establish care. When you choose a [facility] primary care physician or advanced practice provider, you gain a long-term partner in health. Find a Primary Care Provider  Learn more about [facility]'s in-office and virtual care options: [facility]

## 2022-09-05 ENCOUNTER — Telehealth (INDEPENDENT_AMBULATORY_CARE_PROVIDER_SITE_OTHER): Payer: Self-pay | Admitting: Family Medicine

## 2022-09-05 DIAGNOSIS — Z91199 Patient's noncompliance with other medical treatment and regimen due to unspecified reason: Secondary | ICD-10-CM

## 2022-09-05 NOTE — Progress Notes (Signed)
Patient was called twice and informed to get on for her virtual visit for today and each time she stated she would and never did.   The front desk  was notified as well. Jamirra Curnow,cma

## 2022-09-06 ENCOUNTER — Telehealth: Payer: Self-pay

## 2022-09-06 NOTE — Telephone Encounter (Signed)
Dr. Volanda Napoleon did not see the patient on 09/05/2022, patient had laryngitis and when I called she stated she only needed a Dr. Radene Ou, she still felt bad and had to return to work 09/06/2022.  Patient was seen by telehealth, Dr. Jerelene Redden on 08/2022 with a note for work.  Dr. Volanda Napoleon informed me to message the provider and ask her to give the patient another note for work and I send a secure message to the provider, she did not respond until this morning 09/06/2022, stating she is a virtual provider and cannot extend notes for work, I informed Dr. Volanda Napoleon and she then stated to call the patient and give her a note to be out until tomorrow.  09/07/2022. I called the patient and could not reach her or leave a VM.  Oluwadara Gorman,cma

## 2022-09-08 ENCOUNTER — Telehealth: Payer: BC Managed Care – PPO | Admitting: Nurse Practitioner

## 2022-09-08 ENCOUNTER — Emergency Department: Payer: BC Managed Care – PPO

## 2022-09-08 ENCOUNTER — Inpatient Hospital Stay
Admission: EM | Admit: 2022-09-08 | Discharge: 2022-09-10 | DRG: 190 | Disposition: A | Discharge status: Home / Self Care | Payer: BC Managed Care – PPO | Attending: Student | Admitting: Student

## 2022-09-08 DIAGNOSIS — U071 COVID-19: Secondary | ICD-10-CM | POA: Diagnosis present

## 2022-09-08 DIAGNOSIS — E871 Hypo-osmolality and hyponatremia: Secondary | ICD-10-CM

## 2022-09-08 DIAGNOSIS — Z888 Allergy status to other drugs, medicaments and biological substances status: Secondary | ICD-10-CM

## 2022-09-08 DIAGNOSIS — E538 Deficiency of other specified B group vitamins: Secondary | ICD-10-CM | POA: Diagnosis present

## 2022-09-08 DIAGNOSIS — F32A Depression, unspecified: Secondary | ICD-10-CM | POA: Diagnosis not present

## 2022-09-08 DIAGNOSIS — E872 Acidosis, unspecified: Secondary | ICD-10-CM | POA: Diagnosis present

## 2022-09-08 DIAGNOSIS — F411 Generalized anxiety disorder: Secondary | ICD-10-CM | POA: Diagnosis present

## 2022-09-08 DIAGNOSIS — E876 Hypokalemia: Secondary | ICD-10-CM | POA: Diagnosis present

## 2022-09-08 DIAGNOSIS — E66813 Obesity, class 3: Secondary | ICD-10-CM | POA: Diagnosis present

## 2022-09-08 DIAGNOSIS — D696 Thrombocytopenia, unspecified: Secondary | ICD-10-CM | POA: Diagnosis present

## 2022-09-08 DIAGNOSIS — E878 Other disorders of electrolyte and fluid balance, not elsewhere classified: Secondary | ICD-10-CM | POA: Diagnosis present

## 2022-09-08 DIAGNOSIS — R000 Tachycardia, unspecified: Secondary | ICD-10-CM

## 2022-09-08 DIAGNOSIS — Z9851 Tubal ligation status: Secondary | ICD-10-CM

## 2022-09-08 DIAGNOSIS — T380X5A Adverse effect of glucocorticoids and synthetic analogues, initial encounter: Secondary | ICD-10-CM | POA: Diagnosis present

## 2022-09-08 DIAGNOSIS — Z7952 Long term (current) use of systemic steroids: Secondary | ICD-10-CM

## 2022-09-08 DIAGNOSIS — Z6838 Body mass index (BMI) 38.0-38.9, adult: Secondary | ICD-10-CM

## 2022-09-08 DIAGNOSIS — F419 Anxiety disorder, unspecified: Secondary | ICD-10-CM | POA: Diagnosis not present

## 2022-09-08 DIAGNOSIS — M6282 Rhabdomyolysis: Secondary | ICD-10-CM | POA: Diagnosis present

## 2022-09-08 DIAGNOSIS — J441 Chronic obstructive pulmonary disease with (acute) exacerbation: Principal | ICD-10-CM | POA: Diagnosis present

## 2022-09-08 DIAGNOSIS — I10 Essential (primary) hypertension: Secondary | ICD-10-CM | POA: Diagnosis present

## 2022-09-08 DIAGNOSIS — R918 Other nonspecific abnormal finding of lung field: Secondary | ICD-10-CM | POA: Diagnosis not present

## 2022-09-08 DIAGNOSIS — R9431 Abnormal electrocardiogram [ECG] [EKG]: Secondary | ICD-10-CM | POA: Insufficient documentation

## 2022-09-08 DIAGNOSIS — F17210 Nicotine dependence, cigarettes, uncomplicated: Secondary | ICD-10-CM | POA: Diagnosis present

## 2022-09-08 DIAGNOSIS — J439 Emphysema, unspecified: Secondary | ICD-10-CM | POA: Diagnosis present

## 2022-09-08 DIAGNOSIS — Z818 Family history of other mental and behavioral disorders: Secondary | ICD-10-CM

## 2022-09-08 DIAGNOSIS — J209 Acute bronchitis, unspecified: Secondary | ICD-10-CM | POA: Diagnosis present

## 2022-09-08 LAB — CBC WITH DIFFERENTIAL/PLATELET
Abs Immature Granulocytes: 0.1 10*3/uL — ABNORMAL HIGH (ref 0.00–0.07)
Basophils Absolute: 0.1 10*3/uL (ref 0.0–0.1)
Basophils Relative: 1 %
Eosinophils Absolute: 0.2 10*3/uL (ref 0.0–0.5)
Eosinophils Relative: 1 %
HCT: 46.8 % — ABNORMAL HIGH (ref 36.0–46.0)
Hemoglobin: 16.3 g/dL — ABNORMAL HIGH (ref 12.0–15.0)
Immature Granulocytes: 1 %
Lymphocytes Relative: 11 %
Lymphs Abs: 1.8 10*3/uL (ref 0.7–4.0)
MCH: 34.5 pg — ABNORMAL HIGH (ref 26.0–34.0)
MCHC: 34.8 g/dL (ref 30.0–36.0)
MCV: 98.9 fL (ref 80.0–100.0)
Monocytes Absolute: 0.9 10*3/uL (ref 0.1–1.0)
Monocytes Relative: 6 %
Neutro Abs: 13.7 10*3/uL — ABNORMAL HIGH (ref 1.7–7.7)
Neutrophils Relative %: 80 %
Platelets: 40 10*3/uL — ABNORMAL LOW (ref 150–400)
RBC: 4.73 MIL/uL (ref 3.87–5.11)
RDW: 13.6 % (ref 11.5–15.5)
WBC: 16.9 10*3/uL — ABNORMAL HIGH (ref 4.0–10.5)
nRBC: 0 % (ref 0.0–0.2)

## 2022-09-08 LAB — COMPREHENSIVE METABOLIC PANEL
ALT: 39 U/L (ref 0–44)
AST: 72 U/L — ABNORMAL HIGH (ref 15–41)
Albumin: 3.3 g/dL — ABNORMAL LOW (ref 3.5–5.0)
Alkaline Phosphatase: 173 U/L — ABNORMAL HIGH (ref 38–126)
Anion gap: 20 — ABNORMAL HIGH (ref 5–15)
BUN: 12 mg/dL (ref 6–20)
CO2: 18 mmol/L — ABNORMAL LOW (ref 22–32)
Calcium: 7.5 mg/dL — ABNORMAL LOW (ref 8.9–10.3)
Chloride: 92 mmol/L — ABNORMAL LOW (ref 98–111)
Creatinine, Ser: 0.56 mg/dL (ref 0.44–1.00)
GFR, Estimated: >60 mL/min (ref >60)
Glucose, Bld: 152 mg/dL — ABNORMAL HIGH (ref 70–99)
Potassium: 3.2 mmol/L — ABNORMAL LOW (ref 3.5–5.1)
Sodium: 130 mmol/L — ABNORMAL LOW (ref 135–145)
Total Bilirubin: 0.9 mg/dL (ref 0.3–1.2)
Total Protein: 6.4 g/dL — ABNORMAL LOW (ref 6.5–8.1)

## 2022-09-08 LAB — URINALYSIS, ROUTINE W REFLEX MICROSCOPIC
Bacteria, UA: NONE SEEN
Bilirubin Urine: NEGATIVE
Glucose, UA: 50 mg/dL — AB
Ketones, ur: 20 mg/dL — AB
Leukocytes,Ua: NEGATIVE
Nitrite: NEGATIVE
Protein, ur: 30 mg/dL — AB
Specific Gravity, Urine: 1.025 (ref 1.005–1.030)
Squamous Epithelial / HPF: NONE SEEN /HPF (ref 0–5)
pH: 5 (ref 5.0–8.0)

## 2022-09-08 LAB — TROPONIN I (HIGH SENSITIVITY): Troponin I (High Sensitivity): 21 ng/L — ABNORMAL HIGH (ref <18)

## 2022-09-08 MED ORDER — SODIUM CHLORIDE 0.9 % IV BOLUS
1000.0000 mL | Freq: Once | INTRAVENOUS | Status: AC
  Administered 2022-09-08: 1000 mL via INTRAVENOUS

## 2022-09-08 MED ORDER — GUAIFENESIN ER 600 MG PO TB12
600.0000 mg | ORAL_TABLET | Freq: Two times a day (BID) | ORAL | Status: DC
  Administered 2022-09-09 – 2022-09-10 (×4): 600 mg via ORAL
  Filled 2022-09-08 (×4): qty 1

## 2022-09-08 MED ORDER — ENOXAPARIN SODIUM 60 MG/0.6ML IJ SOSY: 0.5000 mg/kg | PREFILLED_SYRINGE | INTRAMUSCULAR | Status: DC

## 2022-09-08 MED ORDER — ESCITALOPRAM OXALATE 10 MG PO TABS
20.0000 mg | ORAL_TABLET | Freq: Every day | ORAL | Status: DC
  Administered 2022-09-09 – 2022-09-10 (×2): 20 mg via ORAL
  Filled 2022-09-08 (×2): qty 2

## 2022-09-08 MED ORDER — IPRATROPIUM-ALBUTEROL 0.5-2.5 (3) MG/3ML IN SOLN
3.0000 mL | Freq: Once | RESPIRATORY_TRACT | Status: AC
  Administered 2022-09-09: 3 mL via RESPIRATORY_TRACT
  Filled 2022-09-08: qty 3

## 2022-09-08 MED ORDER — FLUTICASONE PROPIONATE 50 MCG/ACT NA SUSP
2.0000 | Freq: Every day | NASAL | Status: DC
  Administered 2022-09-09 – 2022-09-10 (×2): 2 via NASAL
  Filled 2022-09-08: qty 16

## 2022-09-08 MED ORDER — METHYLPREDNISOLONE SODIUM SUCC 125 MG IJ SOLR
125.0000 mg | Freq: Once | INTRAMUSCULAR | Status: AC
  Administered 2022-09-08: 125 mg via INTRAVENOUS
  Filled 2022-09-08: qty 2

## 2022-09-08 MED ORDER — MAGNESIUM HYDROXIDE 400 MG/5ML PO SUSP: 30.0000 mL | Freq: Every day | ORAL | Status: DC | PRN

## 2022-09-08 MED ORDER — ALPRAZOLAM 0.25 MG PO TABS
0.2500 mg | ORAL_TABLET | Freq: Every day | ORAL | Status: DC | PRN
  Administered 2022-09-09: 0.25 mg via ORAL
  Filled 2022-09-08: qty 1

## 2022-09-08 MED ORDER — METHYLPREDNISOLONE SODIUM SUCC 40 MG IJ SOLR
40.0000 mg | Freq: Two times a day (BID) | INTRAMUSCULAR | Status: AC
  Administered 2022-09-09 (×2): 40 mg via INTRAVENOUS
  Filled 2022-09-08 (×2): qty 1

## 2022-09-08 MED ORDER — BENZONATATE 100 MG PO CAPS
100.0000 mg | ORAL_CAPSULE | Freq: Three times a day (TID) | ORAL | Status: DC | PRN
  Filled 2022-09-08: qty 1

## 2022-09-08 MED ORDER — AMLODIPINE BESYLATE 10 MG PO TABS
10.0000 mg | ORAL_TABLET | Freq: Every day | ORAL | Status: DC
  Administered 2022-09-09 – 2022-09-10 (×2): 10 mg via ORAL
  Filled 2022-09-08 (×2): qty 1

## 2022-09-08 MED ORDER — SODIUM CHLORIDE 0.9 % IV SOLN: INTRAVENOUS | Status: DC

## 2022-09-08 MED ORDER — ONDANSETRON HCL 4 MG/2ML IJ SOLN: 4.0000 mg | Freq: Four times a day (QID) | INTRAMUSCULAR | Status: DC | PRN

## 2022-09-08 MED ORDER — LORATADINE 10 MG PO TABS
10.0000 mg | ORAL_TABLET | Freq: Every evening | ORAL | Status: DC
  Administered 2022-09-09: 10 mg via ORAL
  Filled 2022-09-08: qty 1

## 2022-09-08 MED ORDER — IOHEXOL 350 MG/ML SOLN
100.0000 mL | Freq: Once | INTRAVENOUS | Status: AC | PRN
  Administered 2022-09-08: 100 mL via INTRAVENOUS

## 2022-09-08 MED ORDER — ALPRAZOLAM 0.5 MG PO TABS
0.2500 mg | ORAL_TABLET | Freq: Once | ORAL | Status: AC
  Administered 2022-09-09: 0.25 mg via ORAL
  Filled 2022-09-08: qty 1

## 2022-09-08 MED ORDER — TRAZODONE HCL 50 MG PO TABS: 50.0000 mg | ORAL_TABLET | Freq: Every evening | ORAL | Status: DC | PRN

## 2022-09-08 MED ORDER — PREDNISONE 20 MG PO TABS
40.0000 mg | ORAL_TABLET | Freq: Every day | ORAL | Status: DC
  Administered 2022-09-10: 40 mg via ORAL
  Filled 2022-09-08: qty 2

## 2022-09-08 MED ORDER — HYDROCOD POLI-CHLORPHE POLI ER 10-8 MG/5ML PO SUER
5.0000 mL | Freq: Two times a day (BID) | ORAL | Status: DC | PRN
  Administered 2022-09-09 – 2022-09-10 (×2): 5 mL via ORAL
  Filled 2022-09-08 (×2): qty 5

## 2022-09-08 MED ORDER — PANTOPRAZOLE SODIUM 40 MG PO TBEC
40.0000 mg | DELAYED_RELEASE_TABLET | Freq: Every day | ORAL | Status: DC
  Administered 2022-09-09 – 2022-09-10 (×2): 40 mg via ORAL
  Filled 2022-09-08 (×2): qty 1

## 2022-09-08 MED ORDER — ARIPIPRAZOLE 2 MG PO TABS
2.0000 mg | ORAL_TABLET | Freq: Every day | ORAL | Status: DC
  Administered 2022-09-09 – 2022-09-10 (×2): 2 mg via ORAL
  Filled 2022-09-08 (×2): qty 1

## 2022-09-08 MED ORDER — CARVEDILOL 25 MG PO TABS
25.0000 mg | ORAL_TABLET | Freq: Two times a day (BID) | ORAL | Status: DC
  Administered 2022-09-09: 25 mg via ORAL
  Filled 2022-09-08: qty 1

## 2022-09-08 MED ORDER — ACETAMINOPHEN 325 MG PO TABS
650.0000 mg | ORAL_TABLET | Freq: Four times a day (QID) | ORAL | Status: DC | PRN
  Administered 2022-09-09: 650 mg via ORAL
  Filled 2022-09-08: qty 2

## 2022-09-08 MED ORDER — SODIUM CHLORIDE 0.9 % IV SOLN
2.0000 g | INTRAVENOUS | Status: DC
  Administered 2022-09-09 – 2022-09-10 (×2): 2 g via INTRAVENOUS
  Filled 2022-09-08: qty 2
  Filled 2022-09-08: qty 20

## 2022-09-08 MED ORDER — PREDNISONE 20 MG PO TABS: 40.0000 mg | ORAL_TABLET | Freq: Every day | ORAL | 0 refills | Status: AC

## 2022-09-08 MED ORDER — SODIUM CHLORIDE 0.9 % IV BOLUS
1000.0000 mL | Freq: Once | INTRAVENOUS | Status: AC
  Administered 2022-09-09: 1000 mL via INTRAVENOUS

## 2022-09-08 MED ORDER — ONDANSETRON HCL 4 MG PO TABS: 4.0000 mg | ORAL_TABLET | Freq: Four times a day (QID) | ORAL | Status: DC | PRN

## 2022-09-08 MED ORDER — ACETAMINOPHEN 650 MG RE SUPP: 650.0000 mg | Freq: Four times a day (QID) | RECTAL | Status: DC | PRN

## 2022-09-08 NOTE — Progress Notes (Signed)
Virtual Visit Consent   Carly [name], you are scheduled for a virtual visit with Mary-Margaret Hassell Done, FNP, a [facility] provider, today.     Just as with appointments in the office, your consent must be obtained to participate.  Your consent will be active for this visit and any virtual visit you may have with one of our providers in the next 365 days.     If you have a MyChart account, a copy of this consent can be sent to you electronically.  All virtual visits are billed to your insurance company just like a traditional visit in the office.    As this is a virtual visit, video technology does not allow for your provider to perform a traditional examination.  This may limit your provider's ability to fully assess your condition.  If your provider identifies any concerns that need to be evaluated in person or the need to arrange testing (such as labs, EKG, etc.), we will make arrangements to do so.     Although advances in technology are sophisticated, we cannot ensure that it will always work on either your end or our end.  If the connection with a video visit is poor, the visit may have to be switched to a telephone visit.  With either a video or telephone visit, we are not always able to ensure that we have a secure connection.     I need to obtain your verbal consent now.   Are you willing to proceed with your visit today? YES   Carly [name] has provided verbal consent on 09/08/2022 for a virtual visit (video or telephone).   Mary-Margaret Hassell Done, FNP   Date: 09/08/2022 6:26 PM   Virtual Visit via Video Note   I, Mary-Margaret Hassell Done, connected with Carly [name] ([id number], July 07, 1971) on 09/08/22 at  6:30 PM EST by a video-enabled telemedicine application and verified that I am speaking with the correct person using two identifiers.  Location: Patient: Virtual Visit Location Patient: Home Provider: Virtual Visit Location Provider: Mobile   I discussed the limitations of  evaluation and management by telemedicine and the availability of in person appointments. The patient expressed understanding and agreed to proceed.    History of Present Illness: Carly [name] is a 52 y.o. who identifies as a female who was assigned female at birth, and is being seen today for covid positive .  HPI: URI  This is a new problem. The problem has been waxing and waning. There has been no fever. Associated symptoms include congestion, coughing, headaches, rhinorrhea and a sore throat. She has tried acetaminophen (tylenol sinus) for the symptoms.   Tested positive for covid 3 days agi Review of Systems  HENT:  Positive for congestion, rhinorrhea and sore throat.   Respiratory:  Positive for cough.   Neurological:  Positive for headaches.    Problems:  Patient Active Problem List   Diagnosis Date Noted   Emphysema lung ([place]) 08/08/2022   Aortic atherosclerosis ([place]) 08/08/2022   Mood disorder ([place]) 08/08/2022   Colon cancer screening 08/08/2022   Breast cancer screening by mammogram 08/08/2022   Abnormal glucose 08/08/2022   Allergic rhinitis 11/24/2021   Lumbar radiculopathy 07/11/2021   Gastroesophageal reflux disease without esophagitis 07/01/2020   Hyperlipidemia 07/01/2020   Fatty liver 12/24/2019   Angiolipoma of left kidney 12/24/2019   Chronic ITP (idiopathic thrombocytopenic purpura) ([place]) 12/02/2019   Plantar fasciitis, bilateral 09/29/2019   Osteoarthritis of both knees 09/29/2019  Overactive bladder 07/14/2019   B12 deficiency 06/10/2019   Vitamin D deficiency 06/10/2019   Insomnia 08/15/2016   Carpal tunnel syndrome 01/20/2016   Asthma-COPD overlap syndrome 01/13/2015   Female hirsutism 09/22/2014   Elevated testosterone level in female 09/22/2014   Tobacco use disorder 08/23/2014   Obesity (BMI 30-39.9) 08/23/2014   HTN (hypertension) 08/23/2014    Allergies:  Allergies  Allergen Reactions   Wellbutrin [Bupropion]     Crazy    Meloxicam  Nausea Only   Medications:  Current Outpatient Medications:    acetaminophen (TYLENOL) 500 MG tablet, Take 500 mg by mouth every 6 (six) hours as needed., Disp: , Rfl:    albuterol (PROVENTIL) (2.5 MG/3ML) 0.083% nebulizer solution, Take 3 mLs (2.5 mg total) by nebulization every 6 (six) hours as needed for wheezing or shortness of breath., Disp: 360 mL, Rfl: 1   albuterol (VENTOLIN HFA) 108 (90 Base) MCG/ACT inhaler, INHALE 2 PUFFS INTO THE LUNGS EVERY 6 HOURS AS NEEDED FOR WHEEZING OR SHORTNESS OF BREATH (COUGH), Disp: 6.7 each, Rfl: 3   ALPRAZolam (XANAX) 0.25 MG tablet, Take 1 tablet (0.25 mg total) by mouth daily as needed for anxiety., Disp: 30 tablet, Rfl: 2   amLODipine (NORVASC) 10 MG tablet, Take 1 tablet (10 mg total) by mouth daily., Disp: 90 tablet, Rfl: 3   ARIPiprazole (ABILIFY) 2 MG tablet, Take 1 tablet (2 mg total) by mouth daily. Dc rexulti not covered, Disp: 90 tablet, Rfl: 3   benzonatate (TESSALON) 100 MG capsule, Take 1 capsule (100 mg total) by mouth 3 (three) times daily as needed for cough., Disp: 20 capsule, Rfl: 0   BREZTRI AEROSPHERE 160-9-4.8 MCG/ACT AERO, INHALE 2 PUFFS INTO THE LUNGS IN THE MORNING AND AT BEDTIME., Disp: 10.7 each, Rfl: 5   carvedilol (COREG) 25 MG tablet, Take 1 tablet (25 mg total) by mouth 2 (two) times daily with a meal., Disp: 180 tablet, Rfl: 3   Cholecalciferol 1.25 MG (50000 UT) capsule, Take 1 capsule (50,000 Units total) by mouth every 30 (thirty) days. 1x per month note this, Disp: 13 capsule, Rfl: 0   cyanocobalamin (VITAMIN B12) 1000 MCG/ML injection, INJECT 1 ML (1,000 MCG TOTAL) INTO THE MUSCLE EVERY 30 DAYS., Disp: 1 mL, Rfl: 15   diclofenac Sodium (VOLTAREN) 1 % GEL, Apply 2-4 g topically 4 (four) times daily. 2 grams upper body qid prn and 4 gram lower body qid prn, Disp: 150 g, Rfl: 11   escitalopram (LEXAPRO) 20 MG tablet, TAKE 1 TABLET BY MOUTH EVERY DAY, Disp: 90 tablet, Rfl: 4   fluticasone (FLONASE) 50 MCG/ACT nasal spray,  Place 2 sprays into both nostrils daily., Disp: 16 g, Rfl: 0   levocetirizine (XYZAL) 5 MG tablet, Take 1 tablet (5 mg total) by mouth every evening., Disp: 30 tablet, Rfl: 0   omeprazole (PRILOSEC) 20 MG capsule, TAKE 1 CAPSULE BY MOUTH EVERY DAY, Disp: 90 capsule, Rfl: 3   traZODone (DESYREL) 50 MG tablet, TAKE 0.5-1 TABLETS BY MOUTH AT BEDTIME AS NEEDED FOR SLEEP., Disp: 90 tablet, Rfl: 3  Observations/Objective: Patient is well-developed, well-nourished in no acute distress.  Resting comfortably at home.  Head is normocephalic, atraumatic.  No labored breathing.  Speech is clear and coherent with logical content.  Patient is alert and oriented at baseline.  Voice very hoarse Cough dry sounding  Assessment and Plan:  Carly [name] in today with chief complaint of Covid Positive   1. Positive self-administered antigen test for COVID-19 1. Take  meds as prescribed 2. Use a cool mist humidifier especially during the winter months and when heat has been humid. 3. Use saline nose sprays frequently 4. Saline irrigations of the nose can be very helpful if done frequently.  * 4X daily for 1 week*  * Use of a nettie pot can be helpful with this. Follow directions with this* 5. Drink plenty of fluids 6. Keep thermostat turn down low 7.For any cough or congestion- tessalon perles as previously prescribed 8. For fever or aces or pains- take tylenol or ibuprofen appropriate for age and weight.  * for fevers greater than 101 orally you may alternate ibuprofen and tylenol every  3 hours.    Out of treatment window for antiviral  Meds ordered this encounter  Medications   predniSONE (DELTASONE) 20 MG tablet    Sig: Take 2 tablets (40 mg total) by mouth daily with breakfast for 5 days. 2 po daily for 5 days    Dispense:  10 tablet    Refill:  0    Order Specific Question:   Supervising Provider    Answer:   Chase Picket [id number]         Follow Up Instructions: I discussed  the assessment and treatment plan with the patient. The patient was provided an opportunity to ask questions and all were answered. The patient agreed with the plan and demonstrated an understanding of the instructions.  A copy of instructions were sent to the patient via MyChart.  The patient was advised to call back or seek an in-person evaluation if the symptoms worsen or if the condition fails to improve as anticipated.  Time:  I spent 7 minutes with the patient via telehealth technology discussing the above problems/concerns.    Mary-Margaret Hassell Done, FNP

## 2022-09-08 NOTE — H&P (Signed)
PATIENT NAME: Carly [name]    MR#:  [id number]  DATE OF BIRTH:  [birth year]  DATE OF ADMISSION:  09/08/2022  PRIMARY CARE PHYSICIAN: Carollee Leitz, MD   Patient is coming from: Home  REQUESTING/REFERRING PHYSICIAN: Marjean Donna, MD  CHIEF COMPLAINT:   Chief Complaint  Patient presents with   Shortness of Breath    HISTORY OF PRESENT ILLNESS:  Carly [name] is a 52 y.o. Caucasian female with medical history significant for anxiety, depression, COPD and hypertension, who presented to the ER with acute onset of worsening dyspnea with associated excessive cough productive of yellowish sputum over the last couple of days and wheezing with associated palpitations without chest pain.  She denies any chest pain or palpitations.  No nausea or vomiting or abdominal pain.  She has been having mild diarrhea.  She is to tactile fever and chills but has not checked her temperature.  No loss of taste or smell.  No abdominal pain or melena or bright red bleeding per rectum.  No headache or dizziness or blurred vision.  No paresthesias or focal muscle weakness.  No dysuria, oliguria or hematuria or flank pain.  She was diagnosed with COVID-19 9 days ago.  She stated that that she initially had rhinorrhea nasal congestion with sore throat which have resolved within a few days.  ED Course: Upon presentation to the emergency room, BP was 155/91 with heart rate of 120 and respiratory rate of 22 with pulse oximetry of 95% on room air and temperature 98.2.  Labs revealed mild hyponatremia 130 with hypochloremia of 92, hypokalemia 3.2 and CO2 of 18 with anion gap of 20 and blood glucose 152.  Alk phos was 173 and albumin 3.3 with total protein 6.4.  AST was 72 and ALT 39.  High sensitive troponin I was 21.  CBC showed leukocytosis 16.9 with neutrophilia and hemoconcentration.  Urinalysis showed 30 protein and 50 glucose with 20 ketones..  EKG as reviewed by me : Sinus tachycardia with a rate of  122 with sinus arrhythmia and suspected left atrial enlargement, low voltage QRS with T wave inversion anteroseptally and QT interval prolongation with QTc of 533 MS. Imaging: Portable chest x-ray showed no acute cardiopulmonary disease. Chest CTA is pending.  The patient was given 125 mg of IV Solu-Medrol, 2 L bolus of IV normal saline and 2 DuoNeb's.  She will be admitted to a medical telemetry bed for further evaluation and management.  PAST MEDICAL HISTORY:   Past Medical History:  Diagnosis Date   Abnormal uterine bleeding (AUB) 08/26/2018   Anxiety    C. difficile diarrhea    07/04/21   Chicken pox    Depression    Depression, major, single episode, moderate ([place]) 08/04/2018   Diarrhea 06/07/2022   Dyspnea on exertion 05/24/2022   Elevated testosterone level in female    Emphysema of lung ([facility])    bronchitis   Frequent headaches    Generalized anxiety disorder 08/23/2014   Hay fever    Hypertension    Idiopathic thrombocytopenic purpura (ITP) ([facility]CC)    Iron deficiency 09/02/2017   Positive ANA (antinuclear antibody)    Thrombocytopenia ([place]) 02/12/2016    PAST SURGICAL HISTORY:   Past Surgical History:  Procedure Laterality Date   HEMATOMA EVACUATION N/A 04/07/2020   Procedure: EVACUATION HEMATOMA  AND APPLICATION OF PRESSURE DRESSING;  Surgeon: Benjaman Kindler, MD;  Location: [facility] ORS;  Service: Gynecology;  Laterality: N/A;  HYSTEROSCOPY WITH NOVASURE N/A 02/21/2018   Procedure: HYSTEROSCOPY WITH NOVASURE, POLYPECTOMY;  Surgeon: Benjaman Kindler, MD;  Location: [facility] ORS;  Service: Gynecology;  Laterality: N/A;   TUBAL LIGATION  1997   TUBAL LIGATION      SOCIAL HISTORY:   Social History   Tobacco Use   Smoking status: Every Day    Packs/day: 1.50    Years: 28.00    Total pack years: 42.00    Types: Cigarettes   Smokeless tobacco: Never   Tobacco comments:    1.5 ppd increase d/t death of sister.  04/11/2023 hfb    0.5 PPD 05/24/2022-khj  Substance Use Topics    Alcohol use: Yes    Alcohol/week: 8.0 standard drinks of alcohol    Types: 8 Standard drinks or equivalent per week    Comment: Beer (Can)     FAMILY HISTORY:   Family History  Problem Relation Age of Onset   Hyperlipidemia Mother    Heart disease Mother    Stroke Mother    Depression Mother    Mental illness Mother    Heart attack Mother 53   Hyperlipidemia Father    Depression Father    Mental illness Father    Diabetes Paternal Grandfather    Cancer Cousin     DRUG ALLERGIES:   Allergies  Allergen Reactions   Wellbutrin [Bupropion]     Crazy    Meloxicam Nausea Only    REVIEW OF SYSTEMS:   ROS As per history of present illness. All pertinent systems were reviewed above. Constitutional, HEENT, cardiovascular, respiratory, GI, GU, musculoskeletal, neuro, psychiatric, endocrine, integumentary and hematologic systems were reviewed and are otherwise negative/unremarkable except for positive findings mentioned above in the HPI.   MEDICATIONS AT HOME:   Prior to Admission medications   Medication Sig Start Date End Date Taking? Authorizing Provider  acetaminophen (TYLENOL) 500 MG tablet Take 500 mg by mouth every 6 (six) hours as needed.    [provider]  albuterol (PROVENTIL) (2.5 MG/3ML) 0.083% nebulizer solution Take 3 mLs (2.5 mg total) by nebulization every 6 (six) hours as needed for wheezing or shortness of breath. 03/01/22   Tyler Pita, MD  albuterol (VENTOLIN HFA) 108 (90 Base) MCG/ACT inhaler INHALE 2 PUFFS INTO THE LUNGS EVERY 6 HOURS AS NEEDED FOR WHEEZING OR SHORTNESS OF BREATH (COUGH) 07/25/22   Tyler Pita, MD  ALPRAZolam Duanne Moron) 0.25 MG tablet Take 1 tablet (0.25 mg total) by mouth daily as needed for anxiety. 04/10/22   McLean-Scocuzza, Nino Glow, MD  amLODipine (NORVASC) 10 MG tablet Take 1 tablet (10 mg total) by mouth daily. 10-Apr-2022   McLean-Scocuzza, Nino Glow, MD  ARIPiprazole (ABILIFY) 2 MG tablet Take 1 tablet (2 mg total) by mouth  daily. Dc rexulti not covered 03/26/22   McLean-Scocuzza, Nino Glow, MD  benzonatate (TESSALON) 100 MG capsule Take 1 capsule (100 mg total) by mouth 3 (three) times daily as needed for cough. 09/04/22   Perlie Mayo, NP  BREZTRI AEROSPHERE 160-9-4.8 MCG/ACT AERO INHALE 2 PUFFS INTO THE LUNGS IN THE MORNING AND AT BEDTIME. 07/11/22   Tyler Pita, MD  carvedilol (COREG) 25 MG tablet Take 1 tablet (25 mg total) by mouth 2 (two) times daily with a meal. 2022/04/10   McLean-Scocuzza, Nino Glow, MD  Cholecalciferol 1.25 MG (50000 UT) capsule Take 1 capsule (50,000 Units total) by mouth every 30 (thirty) days. 1x per month note this 06/13/21   McLean-Scocuzza, Nino Glow, MD  cyanocobalamin (  VITAMIN B12) 1000 MCG/ML injection INJECT 1 ML (1,000 MCG TOTAL) INTO THE MUSCLE EVERY 30 DAYS. 08/06/22   Carollee Leitz, MD  diclofenac Sodium (VOLTAREN) 1 % GEL Apply 2-4 g topically 4 (four) times daily. 2 grams upper body qid prn and 4 gram lower body qid prn 11/03/20   McLean-Scocuzza, Nino Glow, MD  escitalopram (LEXAPRO) 20 MG tablet TAKE 1 TABLET BY MOUTH EVERY DAY 02/15/22   McLean-Scocuzza, Nino Glow, MD  fluticasone (FLONASE) 50 MCG/ACT nasal spray Place 2 sprays into both nostrils daily. 09/04/22   Perlie Mayo, NP  levocetirizine (XYZAL) 5 MG tablet Take 1 tablet (5 mg total) by mouth every evening. 11/23/21   Brunetta Jeans, PA-C  omeprazole (PRILOSEC) 20 MG capsule TAKE 1 CAPSULE BY MOUTH EVERY DAY 06/26/21   McLean-Scocuzza, Nino Glow, MD  predniSONE (DELTASONE) 20 MG tablet Take 2 tablets (40 mg total) by mouth daily with breakfast for 5 days. 2 po daily for 5 days 09/08/22 09/13/22  Chevis Pretty, FNP  traZODone (DESYREL) 50 MG tablet TAKE 0.5-1 TABLETS BY MOUTH AT BEDTIME AS NEEDED FOR SLEEP. 03/21/22   McLean-Scocuzza, Nino Glow, MD      VITAL SIGNS:  Blood pressure 107/83, pulse 68, temperature 98.2 F (36.8 C), resp. rate (!) 22, weight 95.8 kg, SpO2 98 %.  PHYSICAL EXAMINATION:  Physical  Exam  GENERAL:  52 y.o.-year-old Caucasian female patient lying in the bed with no acute distress.  EYES: Pupils equal, round, reactive to light and accommodation. No scleral icterus. Extraocular muscles intact.  HEENT: Head atraumatic, normocephalic. Oropharynx and nasopharynx clear.  NECK:  Supple, no jugular venous distention. No thyroid enlargement, no tenderness.  LUNGS: Diffuse expiratory wheezes with tight expiratory airflow and harsh vesicular breathing.  No use of accessory muscles of respiration.  CARDIOVASCULAR: Regular rate and rhythm, S1, S2 normal. No murmurs, rubs, or gallops.  ABDOMEN: Soft, nondistended, nontender. Bowel sounds present. No organomegaly or mass.  EXTREMITIES: No pedal edema, cyanosis, or clubbing.  NEUROLOGIC: Cranial nerves II through XII are intact. Muscle strength 5/5 in all extremities. Sensation intact. Gait not checked.  PSYCHIATRIC: The patient is alert and oriented x 3.  Normal affect and good eye contact. SKIN: No obvious rash, lesion, or ulcer.   LABORATORY PANEL:   CBC Recent Labs  Lab 09/08/22 2143  WBC 16.9*  HGB 16.3*  HCT 46.8*  PLT 40*   ------------------------------------------------------------------------------------------------------------------  Chemistries  Recent Labs  Lab 09/08/22 2143  NA 130*  K 3.2*  CL 92*  CO2 18*  GLUCOSE 152*  BUN 12  CREATININE 0.56  CALCIUM 7.5*  AST 72*  ALT 39  ALKPHOS 173*  BILITOT 0.9   ------------------------------------------------------------------------------------------------------------------  Cardiac Enzymes No results for input(s): "TROPONINI" in the last 168 hours. ------------------------------------------------------------------------------------------------------------------  RADIOLOGY:  CT Angio Chest PE W and/or Wo Contrast  Result Date: 09/09/2022 CLINICAL DATA:  Shortness of breath. EXAM: CT ANGIOGRAPHY CHEST WITH CONTRAST TECHNIQUE: Multidetector CT imaging of  the chest was performed using the standard protocol during bolus administration of intravenous contrast. Multiplanar CT image reconstructions and MIPs were obtained to evaluate the vascular anatomy. RADIATION DOSE REDUCTION: This exam was performed according to the departmental dose-optimization program which includes automated exposure control, adjustment of the mA and/or kV according to patient size and/or use of iterative reconstruction technique. CONTRAST:  165m OMNIPAQUE IOHEXOL 350 MG/ML SOLN COMPARISON:  June 14, 2021 FINDINGS: Cardiovascular: The ascending thoracic aorta measures 3.7 cm in diameter. Satisfactory opacification of the pulmonary arteries  to the segmental level. No evidence of pulmonary embolism. Normal heart size. No pericardial effusion. Mediastinum/Nodes: No enlarged mediastinal, hilar, or axillary lymph nodes. Thyroid gland, trachea, and esophagus demonstrate no significant findings. Lungs/Pleura: A 4 mm noncalcified lung nodule versus focal scar is seen within the posteromedial aspect of the right upper lobe (axial CT image 35, CT series 6). A cluster of 3 mm, 5 mm and 7 mm noncalcified lung nodules versus focal scarring is seen within the posteromedial aspect of the right lung base (axial CT images 118 through 124, CT series 6). There is no evidence of an acute infiltrate, pleural effusion or pneumothorax. Upper Abdomen: There is diffuse fatty infiltration of the liver parenchyma. A 9 mm diameter fat attenuation (approximately -81.92 Hounsfield units) lesion is seen within the anterior lateral aspect of the mid to upper left kidney. Musculoskeletal: No chest wall abnormality. No acute or significant osseous findings. Review of the MIP images confirms the above findings. IMPRESSION: 1. No evidence of pulmonary embolism or other acute intrathoracic process. 2. Subcentimeter right upper lobe and posteromedial right basilar lung nodules versus focal scarring, as described above.  Non-contrast chest CT at 3-6 months is recommended. If the nodules are stable at time of repeat CT, then future CT at 18-24 months (from today's scan) is considered optional for low-risk patients, but is recommended for high-risk patients. This recommendation follows the consensus statement: Guidelines for Management of Incidental Pulmonary Nodules Detected on CT Images: From the Fleischner Society 2017; Radiology 2017; 284:228-243. 3. Hepatic steatosis. 4. 9 mm angiomyolipoma within the mid to upper left kidney. No follow-up imaging is recommended. This recommendation follows ACR consensus guidelines: Management of the Incidental Renal Mass on CT: A White Paper of the ACR Incidental Findings Committee. J Am Coll Radiol [phone]. Electronically Signed   By: Virgina Norfolk M.D.   On: 09/09/2022 00:41   DG Chest Portable 1 View  Result Date: 09/08/2022 CLINICAL DATA:  Shortness of breath.  Cough. EXAM: PORTABLE CHEST 1 VIEW COMPARISON:  05/24/2022 radiograph and prior studies FINDINGS: The cardiomediastinal silhouette is unremarkable. Mild peribronchial thickening is unchanged. There is no evidence of focal airspace disease, pulmonary edema, suspicious pulmonary nodule/mass, pleural effusion, or pneumothorax. No acute bony abnormalities are identified. IMPRESSION: No active disease. Electronically Signed   By: Margarette Canada M.D.   On: 09/08/2022 21:46      IMPRESSION AND PLAN:  Assessment and Plan: COPD exacerbation ([place]) - The patient will be admitted to a medically monitored bed. - We will place the patient IV steroid therapy with IV Solu-Medrol as well as nebulized bronchodilator therapy with duonebs q.i.d. and q.4 hours p.r.n.Marland Kitchen - Mucolytic therapy will be provided with Mucinex and antibiotic therapy with IV Rocephin. - O2 protocol will be followed. - We will hold off long-acting beta agonist.   Hypokalemia - We will replace potassium and check magnesium level. - This is likely  contributing to her sinus tachycardia bedside use of beta agonist and bronchodilator therapy.  COVID-19 - The patient is currently about 9 days in the course of her COVID illness.  Her initial COVID-19 symptoms have resolved. - Given persistent respiratory symptoms we will place her on isolation for now. - Mucolytic therapy and vitamin C and zinc sulfate be provided.   Hyponatremia - We will continue hydration with IV normal saline and follow BMP.  Essential hypertension - We will continue antihypertensives.  Pulmonary nodules - Given high risk with ongoing heavy tobacco abuse, she will need follow-up chest  CT without contrast in 3 to 6 months.  This can be followed by her primary care physician.  Anxiety and depression - We will continue Xanax, Lexapro, trazodone and Abilify.   DVT prophylaxis: Lovenox.  Advanced Care Planning:  Code Status: full code.  Family Communication:  The plan of care was discussed in details with the patient (and family). I answered all questions. The patient agreed to proceed with the above mentioned plan. Further management will depend upon hospital course. Disposition Plan: Back to previous home environment Consults called: none.  All the records are reviewed and case discussed with ED provider.  Status is: Inpatient   At the time of the admission, it appears that the appropriate admission status for this patient is inpatient.  This is judged to be reasonable and necessary in order to provide the required intensity of service to ensure the patient's safety given the presenting symptoms, physical exam findings and initial radiographic and laboratory data in the context of comorbid conditions.  The patient requires inpatient status due to high intensity of service, high risk of further deterioration and high frequency of surveillance required.  I certify that at the time of admission, it is my clinical judgment that the patient will require inpatient  hospital care extending more than 2 midnights.                            Dispo: The patient is from: Home              Anticipated d/c is to: Home              Patient currently is not medically stable to d/c.              Difficult to place patient: No  Christel Mormon M.D on 09/09/2022 at 1:08 AM  [facility]   From 7 PM-7 AM, contact night-coverage [email or web address]  CC: Primary care physician; Carollee Leitz, MD

## 2022-09-08 NOTE — ED Provider Notes (Addendum)
[facility] Provider Note    Event Date/Time   First MD Initiated Contact with Patient 09/08/22 2125     (approximate)   History   Shortness of Breath   HPI  Carly [name] is a 52 y.o. female who comes in with COPD, COVID diagnosis 8 days ago who comes in with increasing shortness of breath.  Patient reports decreased p.o. intake, hoarse force from all the coughing but her biggest concern was the elevated heart rates and feeling like her heart was racing and increasing shortness of breath.  She denies any swelling her legs, falls and hitting her head denies any abdominal pain.     Physical Exam   Triage Vital Signs: Blood pressure (!) 163/86, pulse (!) 117, temperature 98.2 F (36.8 C), resp. rate (!) 22, weight 95.8 kg, SpO2 97 %.   Most recent vital signs: Vitals:   09/08/22 2126 09/08/22 2211  BP: (!) 155/91 (!) 163/86  Pulse: (!) 120 (!) 117  Resp: (!) 22 (!) 22  Temp: 98.2 F (36.8 C)   SpO2: 95% 97%     General: Awake, no distress.  CV:  Good peripheral perfusion.  Tachycardia Resp:  Normal effort.  Wheezing bilaterally Abd:  No distention.  Abdomen is soft and nontender. Other:  No swelling in legs.  No calf tenderness   ED Results / Procedures / Treatments   Labs (all labs ordered are listed, but only abnormal results are displayed) Labs Reviewed - No data to display   EKG  My interpretation of EKG:  Sinus tachycardia rate of 122 without any ST elevation, normal intervals except for prolonged QTc, no T wave versions  RADIOLOGY I have reviewed the xray personally and interpreted and no pneumonia  PROCEDURES:  Critical Care performed: Yes, see critical care procedure note(s)  .1-3 Lead EKG Interpretation  Performed by: Vanessa [place], MD Authorized by: Vanessa Montague, MD     Interpretation: abnormal     ECG rate:  117   ECG rate assessment: tachycardic     Rhythm: sinus tachycardia     Ectopy: none      Conduction: normal      MEDICATIONS ORDERED IN ED: Medications - No data to display   IMPRESSION / MDM / [place] / ED COURSE  I reviewed the triage vital signs and the nursing notes.   Patient's presentation is most consistent with acute presentation with potential threat to life or bodily function.   Patient comes in tachycardic day 9 of COVID with COPD.  Could be a COPD exacerbation and dehydration patient given some fluids.  Will hold off on DuoNebs until we get her heart rate a little bit better controlled.  Labs ordered evaluate for ACS, CT to rule out PE given negative chest x-ray.  CBC does show elevated white count.  Pending CT imaging.  Labs show normal creatinine low bicarb sodium and chloride.  Patient getting some IV fluids.  Her anion gap is elevated suspect from some dehydration.  Her glucose was 152.  She is not a known diabetic I suspect this is all related to dehydration.   Given labs and increased WOB will dw hospital for admission. CT pending.  The patient is on the cardiac monitor to evaluate for evidence of arrhythmia and/or significant heart rate changes.      FINAL CLINICAL IMPRESSION(S) / ED DIAGNOSES   Final diagnoses:  COVID-19  Tachycardia     Rx / DC  Orders   ED Discharge Orders     None        Note:  This document was prepared using Dragon voice recognition software and may include unintentional dictation errors.   Vanessa [place], MD 09/08/22 [id number]    Vanessa [place], MD 09/08/22 [phone]

## 2022-09-08 NOTE — H&P (Incomplete)
[place]   PATIENT NAME: Carly [name]    MR#:  [id number]  DATE OF BIRTH:  [birth year]  DATE OF ADMISSION:  09/08/2022  PRIMARY CARE PHYSICIAN: Carollee Leitz, MD   Patient is coming from: Home  REQUESTING/REFERRING PHYSICIAN: Marjean Donna, MD  CHIEF COMPLAINT:   Chief Complaint  Patient presents with  . Shortness of Breath    HISTORY OF PRESENT ILLNESS:  Carly [name] is a 52 y.o. female with medical history significant for anxiety, depression, COPD and hypertension, who presented to the ER with acute onset of ED Course: *** EKG as reviewed by me : *** Imaging: *** PAST MEDICAL HISTORY:   Past Medical History:  Diagnosis Date  . Abnormal uterine bleeding (AUB) 08/26/2018  . Anxiety   . C. difficile diarrhea    07/04/21  . Chicken pox   . Depression   . Depression, major, single episode, moderate ([place]) 08/04/2018  . Diarrhea 06/07/2022  . Dyspnea on exertion 05/24/2022  . Elevated testosterone level in female   . Emphysema of lung (HCC)    bronchitis  . Frequent headaches   . Generalized anxiety disorder 08/23/2014  . Hay fever   . Hypertension   . Idiopathic thrombocytopenic purpura (ITP) (HCC)   . Iron deficiency 09/02/2017  . Positive ANA (antinuclear antibody)   . Thrombocytopenia ([place]) 02/12/2016    PAST SURGICAL HISTORY:   Past Surgical History:  Procedure Laterality Date  . HEMATOMA EVACUATION N/A 04/07/2020   Procedure: EVACUATION HEMATOMA  AND APPLICATION OF PRESSURE DRESSING;  Surgeon: Benjaman Kindler, MD;  Location: [facility] ORS;  Service: Gynecology;  Laterality: N/A;  . HYSTEROSCOPY WITH NOVASURE N/A 02/21/2018   Procedure: HYSTEROSCOPY WITH NOVASURE, POLYPECTOMY;  Surgeon: Benjaman Kindler, MD;  Location: [facility] ORS;  Service: Gynecology;  Laterality: N/A;  . TUBAL LIGATION  1997  . TUBAL LIGATION      SOCIAL HISTORY:   Social History   Tobacco Use  . Smoking status: Every Day    Packs/day: 1.50    Years: 28.00    Total pack years:  42.00    Types: Cigarettes  . Smokeless tobacco: Never  . Tobacco comments:    1.5 ppd increase d/t death of sister.  03/26/23 hfb    0.5 PPD 05/24/2022-khj  Substance Use Topics  . Alcohol use: Yes    Alcohol/week: 8.0 standard drinks of alcohol    Types: 8 Standard drinks or equivalent per week    Comment: Beer (Can)     FAMILY HISTORY:   Family History  Problem Relation Age of Onset  . Hyperlipidemia Mother   . Heart disease Mother   . Stroke Mother   . Depression Mother   . Mental illness Mother   . Heart attack Mother 25  . Hyperlipidemia Father   . Depression Father   . Mental illness Father   . Diabetes Paternal Grandfather   . Cancer Cousin     DRUG ALLERGIES:   Allergies  Allergen Reactions  . Wellbutrin [Bupropion]     Crazy   . Meloxicam Nausea Only    REVIEW OF SYSTEMS:   ROS As per history of present illness. All pertinent systems were reviewed above. Constitutional, HEENT, cardiovascular, respiratory, GI, GU, musculoskeletal, neuro, psychiatric, endocrine, integumentary and hematologic systems were reviewed and are otherwise negative/unremarkable except for positive findings mentioned above in the HPI.   MEDICATIONS AT HOME:   Prior to Admission medications   Medication Sig Start Date End Date Taking?  Authorizing Provider  acetaminophen (TYLENOL) 500 MG tablet Take 500 mg by mouth every 6 (six) hours as needed.    [provider]  albuterol (PROVENTIL) (2.5 MG/3ML) 0.083% nebulizer solution Take 3 mLs (2.5 mg total) by nebulization every 6 (six) hours as needed for wheezing or shortness of breath. 03/01/22   Tyler Pita, MD  albuterol (VENTOLIN HFA) 108 (90 Base) MCG/ACT inhaler INHALE 2 PUFFS INTO THE LUNGS EVERY 6 HOURS AS NEEDED FOR WHEEZING OR SHORTNESS OF BREATH (COUGH) 07/25/22   Tyler Pita, MD  ALPRAZolam Duanne Moron) 0.25 MG tablet Take 1 tablet (0.25 mg total) by mouth daily as needed for anxiety. 03/21/22   McLean-Scocuzza, Nino Glow, MD  amLODipine (NORVASC) 10 MG tablet Take 1 tablet (10 mg total) by mouth daily. 03/21/22   McLean-Scocuzza, Nino Glow, MD  ARIPiprazole (ABILIFY) 2 MG tablet Take 1 tablet (2 mg total) by mouth daily. Dc rexulti not covered 03/26/22   McLean-Scocuzza, Nino Glow, MD  benzonatate (TESSALON) 100 MG capsule Take 1 capsule (100 mg total) by mouth 3 (three) times daily as needed for cough. 09/04/22   Perlie Mayo, NP  BREZTRI AEROSPHERE 160-9-4.8 MCG/ACT AERO INHALE 2 PUFFS INTO THE LUNGS IN THE MORNING AND AT BEDTIME. 07/11/22   Tyler Pita, MD  carvedilol (COREG) 25 MG tablet Take 1 tablet (25 mg total) by mouth 2 (two) times daily with a meal. 03/21/22   McLean-Scocuzza, Nino Glow, MD  Cholecalciferol 1.25 MG (50000 UT) capsule Take 1 capsule (50,000 Units total) by mouth every 30 (thirty) days. 1x per month note this 06/13/21   McLean-Scocuzza, Nino Glow, MD  cyanocobalamin (VITAMIN B12) 1000 MCG/ML injection INJECT 1 ML (1,000 MCG TOTAL) INTO THE MUSCLE EVERY 30 DAYS. 08/06/22   Carollee Leitz, MD  diclofenac Sodium (VOLTAREN) 1 % GEL Apply 2-4 g topically 4 (four) times daily. 2 grams upper body qid prn and 4 gram lower body qid prn 11/03/20   McLean-Scocuzza, Nino Glow, MD  escitalopram (LEXAPRO) 20 MG tablet TAKE 1 TABLET BY MOUTH EVERY DAY 02/15/22   McLean-Scocuzza, Nino Glow, MD  fluticasone (FLONASE) 50 MCG/ACT nasal spray Place 2 sprays into both nostrils daily. 09/04/22   Perlie Mayo, NP  levocetirizine (XYZAL) 5 MG tablet Take 1 tablet (5 mg total) by mouth every evening. 11/23/21   Brunetta Jeans, PA-C  omeprazole (PRILOSEC) 20 MG capsule TAKE 1 CAPSULE BY MOUTH EVERY DAY 06/26/21   McLean-Scocuzza, Nino Glow, MD  predniSONE (DELTASONE) 20 MG tablet Take 2 tablets (40 mg total) by mouth daily with breakfast for 5 days. 2 po daily for 5 days 09/08/22 09/13/22  Chevis Pretty, FNP  traZODone (DESYREL) 50 MG tablet TAKE 0.5-1 TABLETS BY MOUTH AT BEDTIME AS NEEDED FOR SLEEP. 03/21/22    McLean-Scocuzza, Nino Glow, MD      VITAL SIGNS:  Blood pressure (!) 159/94, pulse 82, temperature 98.2 F (36.8 C), resp. rate (!) 23, weight 95.8 kg, SpO2 97 %.  PHYSICAL EXAMINATION:  Physical Exam  GENERAL:  52 y.o.-year-old patient lying in the bed with no acute distress.  EYES: Pupils equal, round, reactive to light and accommodation. No scleral icterus. Extraocular muscles intact.  HEENT: Head atraumatic, normocephalic. Oropharynx and nasopharynx clear.  NECK:  Supple, no jugular venous distention. No thyroid enlargement, no tenderness.  LUNGS: Normal breath sounds bilaterally, no wheezing, rales,rhonchi or crepitation. No use of accessory muscles of respiration.  CARDIOVASCULAR: Regular rate and rhythm, S1, S2 normal. No murmurs, rubs,  or gallops.  ABDOMEN: Soft, nondistended, nontender. Bowel sounds present. No organomegaly or mass.  EXTREMITIES: No pedal edema, cyanosis, or clubbing.  NEUROLOGIC: Cranial nerves II through XII are intact. Muscle strength 5/5 in all extremities. Sensation intact. Gait not checked.  PSYCHIATRIC: The patient is alert and oriented x 3.  Normal affect and good eye contact. SKIN: No obvious rash, lesion, or ulcer.   LABORATORY PANEL:   CBC Recent Labs  Lab 09/08/22 2143  WBC 16.9*  HGB 16.3*  HCT 46.8*  PLT 40*   ------------------------------------------------------------------------------------------------------------------  Chemistries  Recent Labs  Lab 09/08/22 2143  NA 130*  K 3.2*  CL 92*  CO2 18*  GLUCOSE 152*  BUN 12  CREATININE 0.56  CALCIUM 7.5*  AST 72*  ALT 39  ALKPHOS 173*  BILITOT 0.9   ------------------------------------------------------------------------------------------------------------------  Cardiac Enzymes No results for input(s): "TROPONINI" in the last 168 hours. ------------------------------------------------------------------------------------------------------------------  RADIOLOGY:  DG Chest  Portable 1 View  Result Date: 09/08/2022 CLINICAL DATA:  Shortness of breath.  Cough. EXAM: PORTABLE CHEST 1 VIEW COMPARISON:  05/24/2022 radiograph and prior studies FINDINGS: The cardiomediastinal silhouette is unremarkable. Mild peribronchial thickening is unchanged. There is no evidence of focal airspace disease, pulmonary edema, suspicious pulmonary nodule/mass, pleural effusion, or pneumothorax. No acute bony abnormalities are identified. IMPRESSION: No active disease. Electronically Signed   By: Margarette Canada M.D.   On: 09/08/2022 21:46      IMPRESSION AND PLAN:  Assessment and Plan: No notes have been filed under this hospital service. Service: Hospitalist      DVT prophylaxis: Lovenox***  Advanced Care Planning:  Code Status: full code***  Family Communication:  The plan of care was discussed in details with the patient (and family). I answered all questions. The patient agreed to proceed with the above mentioned plan. Further management will depend upon hospital course. Disposition Plan: Back to previous home environment Consults called: none***  All the records are reviewed and case discussed with ED provider.  Status is: Inpatient {Inpatient:23812}   At the time of the admission, it appears that the appropriate admission status for this patient is inpatient.  This is judged to be reasonable and necessary in order to provide the required intensity of service to ensure the patient's safety given the presenting symptoms, physical exam findings and initial radiographic and laboratory data in the context of comorbid conditions.  The patient requires inpatient status due to high intensity of service, high risk of further deterioration and high frequency of surveillance required.  I certify that at the time of admission, it is my clinical judgment that the patient will require inpatient hospital care extending more than 2 midnights.                            Dispo: The patient is  from: Home              Anticipated d/c is to: Home              Patient currently is not medically stable to d/c.              Difficult to place patient: No  Christel Mormon M.D on 09/08/2022 at 11:56 PM  [facility]   From 7 PM-7 AM, contact night-coverage [email or web address]  CC: Primary care physician; Carollee Leitz, MD

## 2022-09-08 NOTE — Patient Instructions (Signed)
Lin Landsman, thank you for joining Chevis Pretty, FNP for today's virtual visit.  While this provider is not your primary care provider (PCP), if your PCP is located in our provider database this encounter information will be shared with them immediately following your visit.   [facility] account gives you access to today's visit and all your visits, tests, and labs performed at [facility] " click here if you don't have a [facility] account or go to [email or web address]  Consent: (Patient) Carly [name] provided verbal consent for this virtual visit at the beginning of the encounter.  Current Medications:  Current Outpatient Medications:    predniSONE (DELTASONE) 20 MG tablet, Take 2 tablets (40 mg total) by mouth daily with breakfast for 5 days. 2 po daily for 5 days, Disp: 10 tablet, Rfl: 0   acetaminophen (TYLENOL) 500 MG tablet, Take 500 mg by mouth every 6 (six) hours as needed., Disp: , Rfl:    albuterol (PROVENTIL) (2.5 MG/3ML) 0.083% nebulizer solution, Take 3 mLs (2.5 mg total) by nebulization every 6 (six) hours as needed for wheezing or shortness of breath., Disp: 360 mL, Rfl: 1   albuterol (VENTOLIN HFA) 108 (90 Base) MCG/ACT inhaler, INHALE 2 PUFFS INTO THE LUNGS EVERY 6 HOURS AS NEEDED FOR WHEEZING OR SHORTNESS OF BREATH (COUGH), Disp: 6.7 each, Rfl: 3   ALPRAZolam (XANAX) 0.25 MG tablet, Take 1 tablet (0.25 mg total) by mouth daily as needed for anxiety., Disp: 30 tablet, Rfl: 2   amLODipine (NORVASC) 10 MG tablet, Take 1 tablet (10 mg total) by mouth daily., Disp: 90 tablet, Rfl: 3   ARIPiprazole (ABILIFY) 2 MG tablet, Take 1 tablet (2 mg total) by mouth daily. Dc rexulti not covered, Disp: 90 tablet, Rfl: 3   benzonatate (TESSALON) 100 MG capsule, Take 1 capsule (100 mg total) by mouth 3 (three) times daily as needed for cough., Disp: 20 capsule, Rfl: 0   BREZTRI AEROSPHERE 160-9-4.8 MCG/ACT AERO, INHALE 2 PUFFS INTO THE LUNGS IN  THE MORNING AND AT BEDTIME., Disp: 10.7 each, Rfl: 5   carvedilol (COREG) 25 MG tablet, Take 1 tablet (25 mg total) by mouth 2 (two) times daily with a meal., Disp: 180 tablet, Rfl: 3   Cholecalciferol 1.25 MG (50000 UT) capsule, Take 1 capsule (50,000 Units total) by mouth every 30 (thirty) days. 1x per month note this, Disp: 13 capsule, Rfl: 0   cyanocobalamin (VITAMIN B12) 1000 MCG/ML injection, INJECT 1 ML (1,000 MCG TOTAL) INTO THE MUSCLE EVERY 30 DAYS., Disp: 1 mL, Rfl: 15   diclofenac Sodium (VOLTAREN) 1 % GEL, Apply 2-4 g topically 4 (four) times daily. 2 grams upper body qid prn and 4 gram lower body qid prn, Disp: 150 g, Rfl: 11   escitalopram (LEXAPRO) 20 MG tablet, TAKE 1 TABLET BY MOUTH EVERY DAY, Disp: 90 tablet, Rfl: 4   fluticasone (FLONASE) 50 MCG/ACT nasal spray, Place 2 sprays into both nostrils daily., Disp: 16 g, Rfl: 0   levocetirizine (XYZAL) 5 MG tablet, Take 1 tablet (5 mg total) by mouth every evening., Disp: 30 tablet, Rfl: 0   omeprazole (PRILOSEC) 20 MG capsule, TAKE 1 CAPSULE BY MOUTH EVERY DAY, Disp: 90 capsule, Rfl: 3   traZODone (DESYREL) 50 MG tablet, TAKE 0.5-1 TABLETS BY MOUTH AT BEDTIME AS NEEDED FOR SLEEP., Disp: 90 tablet, Rfl: 3   Medications ordered in this encounter:  Meds ordered this encounter  Medications   predniSONE (DELTASONE) 20 MG tablet  Sig: Take 2 tablets (40 mg total) by mouth daily with breakfast for 5 days. 2 po daily for 5 days    Dispense:  10 tablet    Refill:  0    Order Specific Question:   Supervising Provider    Answer:   Chase Picket [id number]     *If you need refills on other medications prior to your next appointment, please contact your pharmacy*  Follow-Up: Call back or seek an in-person evaluation if the symptoms worsen or if the condition fails to improve as anticipated.  [facility] [phone]  Other Instructions 1. Take meds as prescribed 2. Use a cool mist humidifier especially during the  winter months and when heat has been humid. 3. Use saline nose sprays frequently 4. Saline irrigations of the nose can be very helpful if done frequently.  * 4X daily for 1 week*  * Use of a nettie pot can be helpful with this. Follow directions with this* 5. Drink plenty of fluids 6. Keep thermostat turn down low 7.For any cough or congestion- tessalon perles as previously prescribed 8. For fever or aces or pains- take tylenol or ibuprofen appropriate for age and weight.  * for fevers greater than 101 orally you may alternate ibuprofen and tylenol every  3 hours.      If you have been instructed to have an in-person evaluation today at a local Urgent Care facility, please use the link below. It will take you to a list of all of our available [facility], including address, phone number and hours of operation. Please do not delay care.  [facility]  If you or a family member do not have a primary care provider, use the link below to schedule a visit and establish care. When you choose a [facility] primary care physician or advanced practice provider, you gain a long-term partner in health. Find a Primary Care Provider  Learn more about [facility]'s in-office and virtual care options: [facility]

## 2022-09-08 NOTE — ED Triage Notes (Signed)
Pt was dx with covid 8 days ago, has a hx of COPD and sts that she has been feeling increasing sob. Pt's voice is soft and hoarse and she sts its because she has been coughing. Wheezing noted. Pt's breathing is unlabored.

## 2022-09-08 NOTE — ED Notes (Signed)
ED Provider at bedside. 

## 2022-09-09 ENCOUNTER — Other Ambulatory Visit: Payer: Self-pay

## 2022-09-09 ENCOUNTER — Inpatient Hospital Stay: Payer: BC Managed Care – PPO

## 2022-09-09 ENCOUNTER — Encounter: Payer: Self-pay | Admitting: Family Medicine

## 2022-09-09 DIAGNOSIS — E871 Hypo-osmolality and hyponatremia: Secondary | ICD-10-CM

## 2022-09-09 DIAGNOSIS — F419 Anxiety disorder, unspecified: Secondary | ICD-10-CM | POA: Diagnosis not present

## 2022-09-09 DIAGNOSIS — R000 Tachycardia, unspecified: Secondary | ICD-10-CM

## 2022-09-09 DIAGNOSIS — R9431 Abnormal electrocardiogram [ECG] [EKG]: Secondary | ICD-10-CM | POA: Insufficient documentation

## 2022-09-09 DIAGNOSIS — R918 Other nonspecific abnormal finding of lung field: Secondary | ICD-10-CM

## 2022-09-09 DIAGNOSIS — F32A Depression, unspecified: Secondary | ICD-10-CM

## 2022-09-09 DIAGNOSIS — U071 COVID-19: Secondary | ICD-10-CM

## 2022-09-09 DIAGNOSIS — E876 Hypokalemia: Secondary | ICD-10-CM

## 2022-09-09 HISTORY — DX: Tachycardia, unspecified: R00.0

## 2022-09-09 LAB — LACTIC ACID, PLASMA: Lactic Acid, Venous: 1.9 mmol/L (ref 0.5–1.9)

## 2022-09-09 LAB — BASIC METABOLIC PANEL
Anion gap: 17 — ABNORMAL HIGH (ref 5–15)
BUN: 9 mg/dL (ref 6–20)
CO2: 20 mmol/L — ABNORMAL LOW (ref 22–32)
Calcium: 7.3 mg/dL — ABNORMAL LOW (ref 8.9–10.3)
Chloride: 96 mmol/L — ABNORMAL LOW (ref 98–111)
Creatinine, Ser: 0.52 mg/dL (ref 0.44–1.00)
GFR, Estimated: >60 mL/min (ref >60)
Glucose, Bld: 184 mg/dL — ABNORMAL HIGH (ref 70–99)
Potassium: 3.4 mmol/L — ABNORMAL LOW (ref 3.5–5.1)
Sodium: 133 mmol/L — ABNORMAL LOW (ref 135–145)

## 2022-09-09 LAB — HEPATIC FUNCTION PANEL
ALT: 44 U/L (ref 0–44)
AST: 105 U/L — ABNORMAL HIGH (ref 15–41)
Albumin: 3 g/dL — ABNORMAL LOW (ref 3.5–5.0)
Alkaline Phosphatase: 172 U/L — ABNORMAL HIGH (ref 38–126)
Bilirubin, Direct: 0.5 mg/dL — ABNORMAL HIGH (ref 0.0–0.2)
Indirect Bilirubin: 1.5 mg/dL — ABNORMAL HIGH (ref 0.3–0.9)
Total Bilirubin: 2 mg/dL — ABNORMAL HIGH (ref 0.3–1.2)
Total Protein: 5.9 g/dL — ABNORMAL LOW (ref 6.5–8.1)

## 2022-09-09 LAB — CBC
HCT: 45.4 % (ref 36.0–46.0)
Hemoglobin: 15.9 g/dL — ABNORMAL HIGH (ref 12.0–15.0)
MCH: 34.5 pg — ABNORMAL HIGH (ref 26.0–34.0)
MCHC: 35 g/dL (ref 30.0–36.0)
MCV: 98.5 fL (ref 80.0–100.0)
Platelets: 35 10*3/uL — ABNORMAL LOW (ref 150–400)
RBC: 4.61 MIL/uL (ref 3.87–5.11)
RDW: 13.6 % (ref 11.5–15.5)
WBC: 11.7 10*3/uL — ABNORMAL HIGH (ref 4.0–10.5)
nRBC: 0 % (ref 0.0–0.2)

## 2022-09-09 LAB — HIV ANTIBODY (ROUTINE TESTING W REFLEX): HIV Screen 4th Generation wRfx: NONREACTIVE

## 2022-09-09 LAB — MAGNESIUM: Magnesium: 0.8 mg/dL — CL (ref 1.7–2.4)

## 2022-09-09 LAB — CK: Total CK: 700 U/L — ABNORMAL HIGH (ref 38–234)

## 2022-09-09 LAB — TROPONIN I (HIGH SENSITIVITY): Troponin I (High Sensitivity): 29 ng/L — ABNORMAL HIGH (ref <18)

## 2022-09-09 MED ORDER — MAGNESIUM SULFATE 2 GM/50ML IV SOLN
2.0000 g | Freq: Once | INTRAVENOUS | Status: AC
  Administered 2022-09-09: 2 g via INTRAVENOUS
  Filled 2022-09-09: qty 50

## 2022-09-09 MED ORDER — ALPRAZOLAM 0.25 MG PO TABS
0.2500 mg | ORAL_TABLET | Freq: Two times a day (BID) | ORAL | Status: DC | PRN
  Administered 2022-09-09 – 2022-09-10 (×3): 0.25 mg via ORAL
  Filled 2022-09-09 (×3): qty 1

## 2022-09-09 MED ORDER — HYDRALAZINE HCL 25 MG PO TABS: 25.0000 mg | ORAL_TABLET | Freq: Four times a day (QID) | ORAL | Status: DC | PRN

## 2022-09-09 MED ORDER — METOPROLOL SUCCINATE ER 50 MG PO TB24
100.0000 mg | ORAL_TABLET | Freq: Every day | ORAL | Status: DC
  Administered 2022-09-10: 100 mg via ORAL
  Filled 2022-09-09: qty 2

## 2022-09-09 MED ORDER — ZINC SULFATE 220 (50 ZN) MG PO CAPS
220.0000 mg | ORAL_CAPSULE | Freq: Every day | ORAL | Status: DC
  Administered 2022-09-09 – 2022-09-10 (×2): 220 mg via ORAL
  Filled 2022-09-09 (×2): qty 1

## 2022-09-09 MED ORDER — METOPROLOL SUCCINATE ER 50 MG PO TB24
100.0000 mg | ORAL_TABLET | Freq: Once | ORAL | Status: AC
  Administered 2022-09-09: 100 mg via ORAL
  Filled 2022-09-09: qty 2

## 2022-09-09 MED ORDER — IPRATROPIUM-ALBUTEROL 0.5-2.5 (3) MG/3ML IN SOLN: 3.0000 mL | Freq: Four times a day (QID) | RESPIRATORY_TRACT | Status: DC

## 2022-09-09 MED ORDER — POTASSIUM CHLORIDE 20 MEQ PO PACK
40.0000 meq | PACK | Freq: Once | ORAL | Status: AC
  Administered 2022-09-09: 40 meq via ORAL
  Filled 2022-09-09: qty 2

## 2022-09-09 MED ORDER — NICOTINE 21 MG/24HR TD PT24
21.0000 mg | MEDICATED_PATCH | Freq: Every day | TRANSDERMAL | Status: DC
  Administered 2022-09-09 – 2022-09-10 (×2): 21 mg via TRANSDERMAL
  Filled 2022-09-09 (×2): qty 1

## 2022-09-09 MED ORDER — MAGNESIUM SULFATE 4 GM/100ML IV SOLN
4.0000 g | Freq: Once | INTRAVENOUS | Status: AC
  Administered 2022-09-09: 4 g via INTRAVENOUS
  Filled 2022-09-09: qty 100

## 2022-09-09 MED ORDER — VITAMIN C 500 MG PO TABS
1000.0000 mg | ORAL_TABLET | Freq: Every day | ORAL | Status: DC
  Administered 2022-09-09 – 2022-09-10 (×2): 1000 mg via ORAL
  Filled 2022-09-09 (×2): qty 2

## 2022-09-09 MED ORDER — POTASSIUM CHLORIDE CRYS ER 20 MEQ PO TBCR
40.0000 meq | EXTENDED_RELEASE_TABLET | ORAL | Status: AC
  Administered 2022-09-09 (×2): 40 meq via ORAL
  Filled 2022-09-09 (×2): qty 2

## 2022-09-09 MED ORDER — IPRATROPIUM-ALBUTEROL 20-100 MCG/ACT IN AERS
1.0000 | INHALATION_SPRAY | Freq: Four times a day (QID) | RESPIRATORY_TRACT | Status: DC
  Administered 2022-09-09 – 2022-09-10 (×4): 1 via RESPIRATORY_TRACT
  Filled 2022-09-09: qty 4

## 2022-09-09 NOTE — Assessment & Plan Note (Signed)
-   Given high risk with ongoing heavy tobacco abuse, she will need follow-up chest CT without contrast in 3 to 6 months.  This can be followed by her primary care physician.

## 2022-09-09 NOTE — Assessment & Plan Note (Addendum)
-   We will replace potassium and check magnesium level. - This is likely contributing to her sinus tachycardia bedside use of beta agonist and bronchodilator therapy.

## 2022-09-09 NOTE — Progress Notes (Addendum)
PROGRESS NOTE  Carly [name] [id number] DOB: 07-Apr-1971   PCP: Carollee Leitz, MD  Patient is from: Home  DOA: 09/08/2022 LOS: 1  Chief complaints Chief Complaint  Patient presents with   Shortness of Breath     Brief Narrative / Interim history: 52 year old F with PMH of COPD, HTN, anxiety, depression, recent COVID infection, morbid obesity and tobacco use disorder presenting with progressive dyspnea, wheezing and palpitation for about 2 days, and admitted for COPD exacerbation and electrolyte abnormality with hypokalemia and hyponatremia.  Tachycardic to 120s.  Leukocytosis to 17 with left shift.  Mildly elevated LFT.  EKG showed sinus arrhythmia at 122 with prolonged QT to 533.  Patient was started on systemic steroid and breathing treatments, and admitted.  CTA chest negative for PE or pneumonia but possible subcentimeter RUL and RLL nodule    Subjective: Seen and examined earlier this morning.  No major events overnight of this morning.  She feels "a little" better from breathing standpoint.  Continues to endorse dry cough.  Reports good compliance with her inhalers.  Smokes about a pack a day.  She says she is determined to quit smoking now.  She likes to use nicotine patch.  Objective: Vitals:   09/09/22 0030 09/09/22 0112 09/09/22 0831 09/09/22 1216  BP: 107/83 137/86 (!) 157/95 139/85  Pulse: 68 82 (!) 109 97  Resp: (!) '22 20  19  '$ Temp:   97.8 F (36.6 C) 97.8 F (36.6 C)  SpO2: 98% 94% 97% 94%  Weight:        Examination:  GENERAL: No apparent distress.  Nontoxic. HEENT: MMM.  Vision and hearing grossly intact.  NECK: Supple.  No apparent JVD.  RESP:  No IWOB.  Rhonchi and expiratory wheeze bilaterally.  CVS:  RRR. Heart sounds normal.  ABD/GI/GU: BS+. Abd soft, NTND.  MSK/EXT:  Moves extremities. No apparent deformity. No edema.  SKIN: no apparent skin lesion or wound NEURO: Awake, alert and oriented appropriately.  No apparent focal neuro deficit. PSYCH:  Calm. Normal affect.   Procedures:  None  Microbiology summarized: None  Assessment and plan: Active Problems:   COPD exacerbation (HCC)   Hypokalemia   Hyponatremia   COVID-19   Essential hypertension   Pulmonary nodules   Anxiety and depression   Tachycardia   Prolonged QT interval  COPD exacerbation: Likely due to ongoing cigarette smoking.  Reports compliance with inhalers.  CTA chest negative for PE or pneumonia but possible subcentimeter RUL and RLL nodule.  Still with significant dyspnea, rhonchi and wheeze on exam. -Emphasized the importance of smoking cessation.  Determined to quit smoking.  Likes to try nicotine patch -Continue antibiotics, systemic steroid, LABA/LAMA/ICS with as needed DuoNebs. -Mucolytic's and antitussive. -Encourage incentive spirometry/OOB  Recent COVID-19 infection: About 9 days prior to admission.  No temporal correlation with a COPD exacerbation.  CT without acute finding. -Supportive care as above. -Reasonable to continue isolation precaution  Hypokalemia/hyponatremia -Monitor replenish as appropriate  Essential hypertension: BP slightly elevated.  On Coreg and amlodipine at home. -Change Coreg to Toprol-XL -Continue home amlodipine -P.o. hydralazine as needed  Sinus tachycardia -Beta-blocker as above.  Prolonged QT -Electrolytes -Recheck EKG  Tobacco use disorder: Reports smoking about a pack a day. -Determined to quit smoking.  Nicotine patch ordered.  Anxiety and depression -Continue home medications.  Pulmonary nodules: CTA chest showed subcentimeter RUL and RLL nodules -Repeat CT chest in 3 to 6 months recommended.  Elevated AST: Mild.  Patient denies drinking alcohol -  Check CK and LFT  Anion gap metabolic acidosis: Unclear etiology of this.  She is not diabetic.  Hyperglycemia likely due to steroid and stress. -Check hemoglobin A1c and lactic acid.  Leukocytosis/bandemia: Resolved.  Thrombocytopenia:  Chronic. -Discontinue subcu Lovenox -SCD for VTE prophylaxis -Continue monitoring  Morbid obesity Body mass index is 38.63 kg/m. -Encourage lifestyle change to lose weight.          DVT prophylaxis:  Place and maintain sequential compression device Start: 09/09/22 0737  Code Status: Full code Family Communication: Updated patient's husband at bedside Level of care: Telemetry Medical Status is: Inpatient Remains inpatient appropriate because: COPD exacerbation with significant respiratory distress   Final disposition: Home once medically stable Consultants:  None  55 minutes with more than 50% spent in reviewing records, counseling patient/family and coordinating care.   Sch Meds:  Scheduled Meds:  amLODipine  10 mg Oral Daily   ARIPiprazole  2 mg Oral Daily   vitamin C  1,000 mg Oral Daily   escitalopram  20 mg Oral Daily   fluticasone  2 spray Each Nare Daily   guaiFENesin  600 mg Oral BID   Ipratropium-Albuterol  1 puff Inhalation QID   loratadine  10 mg Oral QPM   methylPREDNISolone (SOLU-MEDROL) injection  40 mg Intravenous Q12H   Followed by   Derrill Memo ON 09/10/2022] predniSONE  40 mg Oral Q breakfast   metoprolol succinate  100 mg Oral Once   [START ON 09/10/2022] metoprolol succinate  100 mg Oral Daily   nicotine  21 mg Transdermal Daily   pantoprazole  40 mg Oral Daily   zinc sulfate  220 mg Oral Daily   Continuous Infusions:  sodium chloride Stopped (09/09/22 0230)   cefTRIAXone (ROCEPHIN)  IV 200 mL/hr at 09/09/22 0449   PRN Meds:.acetaminophen **OR** acetaminophen, ALPRAZolam, benzonatate, chlorpheniramine-HYDROcodone, hydrALAZINE, magnesium hydroxide, ondansetron **OR** ondansetron (ZOFRAN) IV, traZODone  Antimicrobials: Anti-infectives (From admission, onward)    Start     Dose/Rate Route Frequency Ordered Stop   09/09/22 0000  cefTRIAXone (ROCEPHIN) 2 g in sodium chloride 0.9 % 100 mL IVPB        2 g 200 mL/hr over 30 Minutes Intravenous Every  24 hours 09/08/22 2353 09/13/22 2359        I have personally reviewed the following labs and images: CBC: Recent Labs  Lab 09/08/22 2143 09/09/22 0524  WBC 16.9* 11.7*  NEUTROABS 13.7*  --   HGB 16.3* 15.9*  HCT 46.8* 45.4  MCV 98.9 98.5  PLT 40* 35*   BMP &GFR Recent Labs  Lab 09/08/22 2143 09/09/22 0524  NA 130* 133*  K 3.2* 3.4*  CL 92* 96*  CO2 18* 20*  GLUCOSE 152* 184*  BUN 12 9  CREATININE 0.56 0.52  CALCIUM 7.5* 7.3*  MG  --  0.8*   Estimated Creatinine Clearance: 89.8 mL/min (by C-G formula based on SCr of 0.52 mg/dL). Liver & Pancreas: Recent Labs  Lab 09/08/22 2143  AST 72*  ALT 39  ALKPHOS 173*  BILITOT 0.9  PROT 6.4*  ALBUMIN 3.3*   No results for input(s): "LIPASE", "AMYLASE" in the last 168 hours. No results for input(s): "AMMONIA" in the last 168 hours. Diabetic: No results for input(s): "HGBA1C" in the last 72 hours. No results for input(s): "GLUCAP" in the last 168 hours. Cardiac Enzymes: No results for input(s): "CKTOTAL", "CKMB", "CKMBINDEX", "TROPONINI" in the last 168 hours. No results for input(s): "PROBNP" in the last 8760 hours. Coagulation Profile: No results  for input(s): "INR", "PROTIME" in the last 168 hours. Thyroid Function Tests: No results for input(s): "TSH", "T4TOTAL", "FREET4", "T3FREE", "THYROIDAB" in the last 72 hours. Lipid Profile: No results for input(s): "CHOL", "HDL", "LDLCALC", "TRIG", "CHOLHDL", "LDLDIRECT" in the last 72 hours. Anemia Panel: No results for input(s): "VITAMINB12", "FOLATE", "FERRITIN", "TIBC", "IRON", "RETICCTPCT" in the last 72 hours. Urine analysis:    Component Value Date/Time   COLORURINE YELLOW (A) 09/08/2022 2143   APPEARANCEUR HAZY (A) 09/08/2022 2143   APPEARANCEUR Hazy (A) 07/17/2016 1438   LABSPEC 1.025 09/08/2022 2143   LABSPEC 1.002 10/06/2012 1138   PHURINE 5.0 09/08/2022 2143   GLUCOSEU 50 (A) 09/08/2022 2143   GLUCOSEU Negative 10/06/2012 1138   HGBUR MODERATE (A)  09/08/2022 2143   BILIRUBINUR NEGATIVE 09/08/2022 2143   BILIRUBINUR negative 05/08/2019 0929   BILIRUBINUR Negative 07/17/2016 1438   BILIRUBINUR Negative 10/06/2012 1138   KETONESUR 20 (A) 09/08/2022 2143   PROTEINUR 30 (A) 09/08/2022 2143   UROBILINOGEN 0.2 05/08/2019 0929   NITRITE NEGATIVE 09/08/2022 2143   LEUKOCYTESUR NEGATIVE 09/08/2022 2143   LEUKOCYTESUR Negative 10/06/2012 1138   Sepsis Labs: Invalid input(s): "PROCALCITONIN", "LACTICIDVEN"  Microbiology: No results found for this or any previous visit (from the past 240 hour(s)).  Radiology Studies: CT Angio Chest PE W and/or Wo Contrast  Result Date: 09/09/2022 CLINICAL DATA:  Shortness of breath. EXAM: CT ANGIOGRAPHY CHEST WITH CONTRAST TECHNIQUE: Multidetector CT imaging of the chest was performed using the standard protocol during bolus administration of intravenous contrast. Multiplanar CT image reconstructions and MIPs were obtained to evaluate the vascular anatomy. RADIATION DOSE REDUCTION: This exam was performed according to the departmental dose-optimization program which includes automated exposure control, adjustment of the mA and/or kV according to patient size and/or use of iterative reconstruction technique. CONTRAST:  112m OMNIPAQUE IOHEXOL 350 MG/ML SOLN COMPARISON:  June 14, 2021 FINDINGS: Cardiovascular: The ascending thoracic aorta measures 3.7 cm in diameter. Satisfactory opacification of the pulmonary arteries to the segmental level. No evidence of pulmonary embolism. Normal heart size. No pericardial effusion. Mediastinum/Nodes: No enlarged mediastinal, hilar, or axillary lymph nodes. Thyroid gland, trachea, and esophagus demonstrate no significant findings. Lungs/Pleura: A 4 mm noncalcified lung nodule versus focal scar is seen within the posteromedial aspect of the right upper lobe (axial CT image 35, CT series 6). A cluster of 3 mm, 5 mm and 7 mm noncalcified lung nodules versus focal scarring is seen  within the posteromedial aspect of the right lung base (axial CT images 118 through 124, CT series 6). There is no evidence of an acute infiltrate, pleural effusion or pneumothorax. Upper Abdomen: There is diffuse fatty infiltration of the liver parenchyma. A 9 mm diameter fat attenuation (approximately -81.92 Hounsfield units) lesion is seen within the anterior lateral aspect of the mid to upper left kidney. Musculoskeletal: No chest wall abnormality. No acute or significant osseous findings. Review of the MIP images confirms the above findings. IMPRESSION: 1. No evidence of pulmonary embolism or other acute intrathoracic process. 2. Subcentimeter right upper lobe and posteromedial right basilar lung nodules versus focal scarring, as described above. Non-contrast chest CT at 3-6 months is recommended. If the nodules are stable at time of repeat CT, then future CT at 18-24 months (from today's scan) is considered optional for low-risk patients, but is recommended for high-risk patients. This recommendation follows the consensus statement: Guidelines for Management of Incidental Pulmonary Nodules Detected on CT Images: From the Fleischner Society 2017; Radiology 2017; 284:228-243. 3. Hepatic steatosis. 4.  9 mm angiomyolipoma within the mid to upper left kidney. No follow-up imaging is recommended. This recommendation follows ACR consensus guidelines: Management of the Incidental Renal Mass on CT: A White Paper of the ACR Incidental Findings Committee. J Am Coll Radiol [phone]. Electronically Signed   By: Virgina Norfolk M.D.   On: 09/09/2022 00:41   DG Chest Portable 1 View  Result Date: 09/08/2022 CLINICAL DATA:  Shortness of breath.  Cough. EXAM: PORTABLE CHEST 1 VIEW COMPARISON:  05/24/2022 radiograph and prior studies FINDINGS: The cardiomediastinal silhouette is unremarkable. Mild peribronchial thickening is unchanged. There is no evidence of focal airspace disease, pulmonary edema, suspicious  pulmonary nodule/mass, pleural effusion, or pneumothorax. No acute bony abnormalities are identified. IMPRESSION: No active disease. Electronically Signed   By: Margarette Canada M.D.   On: 09/08/2022 21:46      Myca Perno T. [place]  If 7PM-7AM, please contact night-coverage [email or web address] 09/09/2022, 12:20 PM

## 2022-09-09 NOTE — Progress Notes (Signed)
PHARMACIST - PHYSICIAN COMMUNICATION  CONCERNING:  Enoxaparin (Lovenox) for DVT Prophylaxis    RECOMMENDATION: Patient was prescribed enoxaprin '40mg'$  q24 hours for VTE prophylaxis.   Filed Weights   09/08/22 2132  Weight: 95.8 kg (211 lb 3.2 oz)    Body mass index is 38.63 kg/m.  Estimated Creatinine Clearance: 89.8 mL/min (by C-G formula based on SCr of 0.56 mg/dL).   Based on [place] patient is candidate for enoxaparin 0.'5mg'$ /kg TBW SQ every 24 hours based on BMI being >30.  DESCRIPTION: Pharmacy has adjusted enoxaparin dose per [facility] policy.  Patient is now receiving enoxaparin 0.5 mg/kg every 24 hours   Renda Rolls, PharmD, [facility] 09/09/2022 12:02 AM

## 2022-09-09 NOTE — Assessment & Plan Note (Signed)
-   We will continue antihypertensives. 

## 2022-09-09 NOTE — Assessment & Plan Note (Addendum)
-   We will continue Xanax, Lexapro, trazodone and Abilify.

## 2022-09-09 NOTE — Assessment & Plan Note (Addendum)
-   The patient will be admitted to a medically monitored bed. - We will place the patient IV steroid therapy with IV Solu-Medrol as well as nebulized bronchodilator therapy with duonebs q.i.d. and q.4 hours p.r.n.Marland Kitchen - Mucolytic therapy will be provided with Mucinex and antibiotic therapy with IV Rocephin. - O2 protocol will be followed. - We will hold off long-acting beta agonist.

## 2022-09-09 NOTE — Assessment & Plan Note (Signed)
-   We will continue hydration with IV normal saline and follow BMP.

## 2022-09-09 NOTE — Assessment & Plan Note (Addendum)
-   The patient is currently about 9 days in the course of her COVID illness.  Her initial COVID-19 symptoms have resolved. - Given persistent respiratory symptoms we will place her on isolation for now. - Mucolytic therapy and vitamin C and zinc sulfate be provided.

## 2022-09-10 DIAGNOSIS — E538 Deficiency of other specified B group vitamins: Secondary | ICD-10-CM

## 2022-09-10 DIAGNOSIS — U071 COVID-19: Secondary | ICD-10-CM | POA: Diagnosis not present

## 2022-09-10 DIAGNOSIS — E876 Hypokalemia: Secondary | ICD-10-CM | POA: Diagnosis not present

## 2022-09-10 DIAGNOSIS — D696 Thrombocytopenia, unspecified: Secondary | ICD-10-CM

## 2022-09-10 DIAGNOSIS — E871 Hypo-osmolality and hyponatremia: Secondary | ICD-10-CM | POA: Diagnosis not present

## 2022-09-10 DIAGNOSIS — J441 Chronic obstructive pulmonary disease with (acute) exacerbation: Secondary | ICD-10-CM | POA: Diagnosis not present

## 2022-09-10 LAB — HEMOGLOBIN A1C
Hgb A1c MFr Bld: 5.7 % — ABNORMAL HIGH (ref 4.8–5.6)
Mean Plasma Glucose: 117 mg/dL

## 2022-09-10 LAB — CBC
HCT: 43.9 % (ref 36.0–46.0)
Hemoglobin: 15.1 g/dL — ABNORMAL HIGH (ref 12.0–15.0)
MCH: 34.8 pg — ABNORMAL HIGH (ref 26.0–34.0)
MCHC: 34.4 g/dL (ref 30.0–36.0)
MCV: 101.2 fL — ABNORMAL HIGH (ref 80.0–100.0)
Platelets: 29 10*3/uL — CL (ref 150–400)
RBC: 4.34 MIL/uL (ref 3.87–5.11)
RDW: 13.8 % (ref 11.5–15.5)
WBC: 11.9 10*3/uL — ABNORMAL HIGH (ref 4.0–10.5)
nRBC: 0 % (ref 0.0–0.2)

## 2022-09-10 LAB — FOLATE: Folate: 3.6 ng/mL — ABNORMAL LOW (ref >5.9)

## 2022-09-10 LAB — RENAL FUNCTION PANEL
Albumin: 3.2 g/dL — ABNORMAL LOW (ref 3.5–5.0)
Anion gap: 11 (ref 5–15)
BUN: 8 mg/dL (ref 6–20)
CO2: 23 mmol/L (ref 22–32)
Calcium: 7.1 mg/dL — ABNORMAL LOW (ref 8.9–10.3)
Chloride: 98 mmol/L (ref 98–111)
Creatinine, Ser: 0.49 mg/dL (ref 0.44–1.00)
GFR, Estimated: >60 mL/min (ref >60)
Glucose, Bld: 182 mg/dL — ABNORMAL HIGH (ref 70–99)
Phosphorus: 1.8 mg/dL — ABNORMAL LOW (ref 2.5–4.6)
Potassium: 4.1 mmol/L (ref 3.5–5.1)
Sodium: 132 mmol/L — ABNORMAL LOW (ref 135–145)

## 2022-09-10 LAB — HEPATIC FUNCTION PANEL
ALT: 36 U/L (ref 0–44)
AST: 64 U/L — ABNORMAL HIGH (ref 15–41)
Albumin: 3.1 g/dL — ABNORMAL LOW (ref 3.5–5.0)
Alkaline Phosphatase: 130 U/L — ABNORMAL HIGH (ref 38–126)
Bilirubin, Direct: 0.3 mg/dL — ABNORMAL HIGH (ref 0.0–0.2)
Indirect Bilirubin: 1 mg/dL — ABNORMAL HIGH (ref 0.3–0.9)
Total Bilirubin: 1.3 mg/dL — ABNORMAL HIGH (ref 0.3–1.2)
Total Protein: 6.2 g/dL — ABNORMAL LOW (ref 6.5–8.1)

## 2022-09-10 LAB — IRON AND TIBC
Iron: 230 ug/dL — ABNORMAL HIGH (ref 28–170)
Saturation Ratios: 69 % — ABNORMAL HIGH (ref 10.4–31.8)
TIBC: 332 ug/dL (ref 250–450)
UIBC: 102 ug/dL

## 2022-09-10 LAB — MAGNESIUM: Magnesium: 2.1 mg/dL (ref 1.7–2.4)

## 2022-09-10 LAB — FERRITIN: Ferritin: 299 ng/mL (ref 11–307)

## 2022-09-10 LAB — RETICULOCYTES
Immature Retic Fract: 26.5 % — ABNORMAL HIGH (ref 2.3–15.9)
RBC.: 4.33 MIL/uL (ref 3.87–5.11)
Retic Count, Absolute: 73.2 10*3/uL (ref 19.0–186.0)
Retic Ct Pct: 1.7 % (ref 0.4–3.1)

## 2022-09-10 LAB — VITAMIN B12: Vitamin B-12: 413 pg/mL (ref 180–914)

## 2022-09-10 LAB — POCT PREGNANCY, URINE: Preg Test, Ur: NEGATIVE

## 2022-09-10 MED ORDER — METOPROLOL SUCCINATE ER 100 MG PO TB24: 100.0000 mg | ORAL_TABLET | Freq: Every day | ORAL | 2 refills | Status: DC

## 2022-09-10 MED ORDER — DOXYCYCLINE HYCLATE 100 MG PO TABS: 100.0000 mg | ORAL_TABLET | Freq: Two times a day (BID) | ORAL | 0 refills | Status: AC

## 2022-09-10 MED ORDER — FOLIC ACID 1 MG PO TABS: 1.0000 mg | ORAL_TABLET | Freq: Every day | ORAL | 0 refills | Status: AC

## 2022-09-10 MED ORDER — LOPERAMIDE HCL 2 MG PO CAPS
2.0000 mg | ORAL_CAPSULE | ORAL | Status: DC | PRN
  Administered 2022-09-10: 2 mg via ORAL
  Filled 2022-09-10: qty 1

## 2022-09-10 MED ORDER — NICOTINE 21 MG/24HR TD PT24: 21.0000 mg | MEDICATED_PATCH | Freq: Every day | TRANSDERMAL | 0 refills | Status: DC

## 2022-09-10 MED ORDER — DOXYCYCLINE HYCLATE 100 MG PO TABS
100.0000 mg | ORAL_TABLET | Freq: Two times a day (BID) | ORAL | Status: DC
  Administered 2022-09-10: 100 mg via ORAL
  Filled 2022-09-10: qty 1

## 2022-09-10 NOTE — TOC Progression Note (Signed)
Transition of Care [facility]) - Progression Note    Patient Details  Name: Carly [name] MRN: [id number] Date of Birth: [birth year]  Transition of Care [facility]) CM/SW [place], RN Phone Number: 09/10/2022, 9:34 AM  Clinical Narrative:     Transition of Care (TOC) Screening Note   Patient Details  Name: Carly [name] Date of Birth: 01-Sep-1970   Transition of Care [facility]) CM/SW Contact:    Conception Oms, RN Phone Number: 09/10/2022, 9:34 AM    Transition of Care Department [facility]) has reviewed patient and no TOC needs have been identified at this time. We will continue to monitor patient advancement through interdisciplinary progression rounds. If new patient transition needs arise, please place a TOC consult.     Expected Discharge Plan: Home/Self Care Barriers to Discharge: No Barriers Identified  Expected Discharge Plan and Services       Living arrangements for the past 2 months: Single Family Home Expected Discharge Date: 09/10/22                                     Social Determinants of Health (SDOH) Interventions SDOH Screenings   Food Insecurity: No Food Insecurity (09/09/2022)  Housing: Low Risk  (09/09/2022)  Transportation Needs: No Transportation Needs (09/09/2022)  Utilities: Not At Risk (09/09/2022)  Depression (PHQ2-9): Low Risk  (08/06/2022)  Recent Concern: Depression (PHQ2-9) - Medium Risk (05/24/2022)  Tobacco Use: High Risk (09/09/2022)    Readmission Risk Interventions     No data to display

## 2022-09-10 NOTE — Plan of Care (Signed)
Patients  has a critical platelets value. NP notified Problem: Education: Goal: Knowledge of disease or condition will improve Outcome: Progressing Goal: Knowledge of the prescribed therapeutic regimen will improve Outcome: Progressing Goal: Individualized Educational Video(s) Outcome: Progressing   Problem: Activity: Goal: Ability to tolerate increased activity will improve Outcome: Progressing Goal: Will verbalize the importance of balancing activity with adequate rest periods Outcome: Progressing   Problem: Respiratory: Goal: Ability to maintain a clear airway will improve Outcome: Progressing Goal: Levels of oxygenation will improve Outcome: Progressing Goal: Ability to maintain adequate ventilation will improve Outcome: Progressing   Problem: Education: Goal: Knowledge of General Education information will improve Description: Including pain rating scale, medication(s)/side effects and non-pharmacologic comfort measures Outcome: Progressing   Problem: Health Behavior/Discharge Planning: Goal: Ability to manage health-related needs will improve Outcome: Progressing   Problem: Clinical Measurements: Goal: Ability to maintain clinical measurements within normal limits will improve Outcome: Progressing Goal: Will remain free from infection Outcome: Progressing Goal: Diagnostic test results will improve Outcome: Progressing Goal: Respiratory complications will improve Outcome: Progressing Goal: Cardiovascular complication will be avoided Outcome: Progressing   Problem: Activity: Goal: Risk for activity intolerance will decrease Outcome: Progressing   Problem: Nutrition: Goal: Adequate nutrition will be maintained Outcome: Progressing   Problem: Coping: Goal: Level of anxiety will decrease Outcome: Progressing   Problem: Elimination: Goal: Will not experience complications related to bowel motility Outcome: Progressing Goal: Will not experience complications  related to urinary retention Outcome: Progressing   Problem: Pain Managment: Goal: General experience of comfort will improve Outcome: Progressing   Problem: Safety: Goal: Ability to remain free from injury will improve Outcome: Progressing   Problem: Skin Integrity: Goal: Risk for impaired skin integrity will decrease Outcome: Progressing

## 2022-09-10 NOTE — Progress Notes (Signed)
Discharge instructions reviewed with patient including followup visits and new medications.  Understanding was verbalized and all questions were answered.  IV removed without complication; patient tolerated well.  Patient discharged home ambulatory in stable condition escorted by nursing staff.

## 2022-09-10 NOTE — Discharge Summary (Signed)
Physician Discharge Summary  Carly [name] [id number] DOB: February 24, 1971 DOA: 09/08/2022  PCP: Carollee Leitz, MD  Admit date: 09/08/2022 Discharge date: 09/10/2022 Admitted From: Home Disposition: Home Recommendations for Outpatient Follow-up:  Follow up with PCP and pulmonology in 1 to 2 weeks. Check CMP and CBC at follow-up. Encourage smoking cessation. CT chest in 3 to 6 months to evaluate RUL and RLL nodules Please follow up on the following pending results: None  Home Health: Not indicated Equipment/Devices: Not indicated  Discharge Condition: Stable CODE STATUS: Full code  Follow-up Information     Carollee Leitz, MD. Schedule an appointment as soon as possible for a visit in 1 week(s).   Specialty: Family Medicine Contact information: [place] [address] [phone]                 Hospital course 52 year old F with PMH of COPD, HTN, anxiety, depression, recent COVID infection, morbid obesity and tobacco use disorder presenting with progressive dyspnea, wheezing and palpitation for about 2 days, and admitted for COPD exacerbation and electrolyte abnormality with hypokalemia and hyponatremia.  Tachycardic to 120s.  Leukocytosis to 17 with left shift.  Mildly elevated LFT.  EKG showed sinus arrhythmia at 122 with prolonged QT to 533.  Patient was started on systemic steroid, antibiotics and breathing treatments, and admitted.  CTA chest negative for PE or pneumonia but possible subcentimeter RUL and RLL nodule.   On the day of discharge, respiratory symptoms improved.  She felt well and ready to go home.  She is discharged on prednisone taper and doxycycline for 3 days she is already on LABA/LAMA/ICS and rescue inhalers.  Discussed the importance of smoking cessation.  Issued prescription for nicotine patch.  Also provided with resources.  See individual problem list below for more.   Problems addressed during this hospitalization Active  Problems:   COPD exacerbation (HCC)   Hypokalemia   Hyponatremia   COVID-19   Essential hypertension   Pulmonary nodules   Morbid obesity (HCC)   Thrombocytopenia (HCC)   Anxiety and depression   Tachycardia   Prolonged QT interval   COPD exacerbation: Likely due to ongoing cigarette smoking.   CTA chest negative for PE or pneumonia but possible subcentimeter RUL and RLL nodule.  Respiratory symptoms improved. -Discharged on prednisone taper, doxycycline for 3 days, LABA/LAMA/ICS. -Encourage smoking cessation.   Recent COVID-19 infection: About 9 days prior to admission.  No temporal correlation with a COPD exacerbation.  CT without acute finding.   Hypokalemia/hyponatremia: Hypokalemia resolved.  Hyponatremia improved. -Monitor replenish as appropriate   Essential hypertension: BP slightly elevated.  On Coreg and amlodipine at home. -Continue home meds.   Sinus tachycardia: Resolved. -Beta-blocker as above.   Prolonged QT: Improved. -Avoid or minimize QT prolonging drugs   Tobacco use disorder: Reports smoking about a pack a day. -Determined to quit smoking.  Nicotine patch ordered.   Anxiety and depression -Continue home medications.   Pulmonary nodules: CTA chest showed subcentimeter RUL and RLL nodules -Repeat CT chest in 3 to 6 months recommended.   Elevated AST: Likely due to mild rhabdo.  Improved. -Recheck CMP at follow-up.  Mild nontraumatic rhabdomyolysis:   Anion gap metabolic acidosis: Resolved.  Unclear etiology of this.   Hyperglycemia likely due to steroid and stress.  A1c 5.7%.  Lactic acid negative.   Leukocytosis/bandemia: Improved.  Folic acid deficiency: -P.o. folic acid 1 mg daily.   Thrombocytopenia: Chronic. -Recheck CBC at follow-up.   Morbid obesity Body  mass index is 38.63 kg/m.            Vital signs Vitals:   09/09/22 1216 09/09/22 1553 09/10/22 0040 09/10/22 0736  BP: 139/85 (!) 147/86 (!) 150/80 (!) 149/84  Pulse: 97  (!) 101 90 82  Temp: 97.8 F (36.6 C) 98.1 F (36.7 C) 97.9 F (36.6 C) (!) 97.5 F (36.4 C)  Resp: '19 19 18 17  '$ Weight:      SpO2: 94% 96% 97% 96%     Discharge exam  GENERAL: No apparent distress.  Nontoxic. HEENT: MMM.  Vision and hearing grossly intact.  NECK: Supple.  No apparent JVD.  RESP:  No IWOB.  Fair aeration bilaterally. CVS:  RRR. Heart sounds normal.  ABD/GI/GU: BS+. Abd soft, NTND.  MSK/EXT:  Moves extremities. No apparent deformity. No edema.  SKIN: no apparent skin lesion or wound NEURO: Awake and alert. Oriented appropriately.  No apparent focal neuro deficit. PSYCH: Calm. Normal affect.   Discharge Instructions Discharge Instructions     Call MD for:  difficulty breathing, headache or visual disturbances   Complete by: As directed    Call MD for:  persistant dizziness or light-headedness   Complete by: As directed    Diet - low sodium heart healthy   Complete by: As directed    Discharge instructions   Complete by: As directed    It has been a pleasure taking care of you!  You were hospitalized due to COPD exacerbation for which you have been treated.  Your symptoms improved.  We are discharging you on prednisone, antibiotics and your home inhalers.  Review your new medication list and the directions on your medications before you take them.  In addition to taking the medications, it is very important that you quit smoking cigarettes. You may use nicotine patch to help you quit smoking.  Nicotine patch is available over-the-counter.  You may also discuss other options to help you quit smoking with your primary care doctor. You can also talk to professional counselors at 1-800-QUIT-NOW (743)555-7834) for free smoking cessation counseling.     Take care,   Increase activity slowly   Complete by: As directed       Allergies as of 09/10/2022       Reactions   Wellbutrin [bupropion]    Crazy   Meloxicam Nausea Only        Medication List      STOP taking these medications    carvedilol 25 MG tablet Commonly known as: COREG       TAKE these medications    acetaminophen 500 MG tablet Commonly known as: TYLENOL Take 500 mg by mouth every 6 (six) hours as needed.   albuterol (2.5 MG/3ML) 0.083% nebulizer solution Commonly known as: PROVENTIL Take 3 mLs (2.5 mg total) by nebulization every 6 (six) hours as needed for wheezing or shortness of breath.   albuterol 108 (90 Base) MCG/ACT inhaler Commonly known as: VENTOLIN HFA INHALE 2 PUFFS INTO THE LUNGS EVERY 6 HOURS AS NEEDED FOR WHEEZING OR SHORTNESS OF BREATH (COUGH)   ALPRAZolam 0.25 MG tablet Commonly known as: XANAX Take 1 tablet (0.25 mg total) by mouth daily as needed for anxiety.   amLODipine 10 MG tablet Commonly known as: NORVASC Take 1 tablet (10 mg total) by mouth daily.   ARIPiprazole 2 MG tablet Commonly known as: Abilify Take 1 tablet (2 mg total) by mouth daily. Dc rexulti not covered   benzonatate 100 MG capsule Commonly known  as: TESSALON Take 1 capsule (100 mg total) by mouth 3 (three) times daily as needed for cough.   Breztri Aerosphere 160-9-4.8 MCG/ACT Aero Generic drug: Budeson-Glycopyrrol-Formoterol INHALE 2 PUFFS INTO THE LUNGS IN THE MORNING AND AT BEDTIME.   Cholecalciferol 1.25 MG (50000 UT) capsule Take 1 capsule (50,000 Units total) by mouth every 30 (thirty) days. 1x per month note this   cyanocobalamin 1000 MCG/ML injection Commonly known as: VITAMIN B12 INJECT 1 ML (1,000 MCG TOTAL) INTO THE MUSCLE EVERY 30 DAYS.   diclofenac Sodium 1 % Gel Commonly known as: Voltaren Apply 2-4 g topically 4 (four) times daily. 2 grams upper body qid prn and 4 gram lower body qid prn   doxycycline 100 MG tablet Commonly known as: VIBRA-TABS Take 1 tablet (100 mg total) by mouth every 12 (twelve) hours for 3 days.   escitalopram 20 MG tablet Commonly known as: LEXAPRO TAKE 1 TABLET BY MOUTH EVERY DAY   fluticasone 50 MCG/ACT nasal  spray Commonly known as: FLONASE Place 2 sprays into both nostrils daily.   folic acid 1 MG tablet Commonly known as: FOLVITE Take 1 tablet (1 mg total) by mouth daily.   levocetirizine 5 MG tablet Commonly known as: XYZAL Take 1 tablet (5 mg total) by mouth every evening.   metoprolol succinate 100 MG 24 hr tablet Commonly known as: TOPROL-XL Take 1 tablet (100 mg total) by mouth daily. Take with or immediately following a meal.   nicotine 21 mg/24hr patch Commonly known as: NICODERM CQ - dosed in mg/24 hours Place 1 patch (21 mg total) onto the skin daily.   omeprazole 20 MG capsule Commonly known as: PRILOSEC TAKE 1 CAPSULE BY MOUTH EVERY DAY   predniSONE 20 MG tablet Commonly known as: DELTASONE Take 2 tablets (40 mg total) by mouth daily with breakfast for 5 days. 2 po daily for 5 days   traZODone 50 MG tablet Commonly known as: DESYREL TAKE 0.5-1 TABLETS BY MOUTH AT BEDTIME AS NEEDED FOR SLEEP.        Consultations: None  Procedures/Studies:   CT Angio Chest PE W and/or Wo Contrast  Result Date: 09/09/2022 CLINICAL DATA:  Shortness of breath. EXAM: CT ANGIOGRAPHY CHEST WITH CONTRAST TECHNIQUE: Multidetector CT imaging of the chest was performed using the standard protocol during bolus administration of intravenous contrast. Multiplanar CT image reconstructions and MIPs were obtained to evaluate the vascular anatomy. RADIATION DOSE REDUCTION: This exam was performed according to the departmental dose-optimization program which includes automated exposure control, adjustment of the mA and/or kV according to patient size and/or use of iterative reconstruction technique. CONTRAST:  164m OMNIPAQUE IOHEXOL 350 MG/ML SOLN COMPARISON:  June 14, 2021 FINDINGS: Cardiovascular: The ascending thoracic aorta measures 3.7 cm in diameter. Satisfactory opacification of the pulmonary arteries to the segmental level. No evidence of pulmonary embolism. Normal heart size. No  pericardial effusion. Mediastinum/Nodes: No enlarged mediastinal, hilar, or axillary lymph nodes. Thyroid gland, trachea, and esophagus demonstrate no significant findings. Lungs/Pleura: A 4 mm noncalcified lung nodule versus focal scar is seen within the posteromedial aspect of the right upper lobe (axial CT image 35, CT series 6). A cluster of 3 mm, 5 mm and 7 mm noncalcified lung nodules versus focal scarring is seen within the posteromedial aspect of the right lung base (axial CT images 118 through 124, CT series 6). There is no evidence of an acute infiltrate, pleural effusion or pneumothorax. Upper Abdomen: There is diffuse fatty infiltration of the liver parenchyma. A 9  mm diameter fat attenuation (approximately -81.92 Hounsfield units) lesion is seen within the anterior lateral aspect of the mid to upper left kidney. Musculoskeletal: No chest wall abnormality. No acute or significant osseous findings. Review of the MIP images confirms the above findings. IMPRESSION: 1. No evidence of pulmonary embolism or other acute intrathoracic process. 2. Subcentimeter right upper lobe and posteromedial right basilar lung nodules versus focal scarring, as described above. Non-contrast chest CT at 3-6 months is recommended. If the nodules are stable at time of repeat CT, then future CT at 18-24 months (from today's scan) is considered optional for low-risk patients, but is recommended for high-risk patients. This recommendation follows the consensus statement: Guidelines for Management of Incidental Pulmonary Nodules Detected on CT Images: From the Fleischner Society 2017; Radiology 2017; 284:228-243. 3. Hepatic steatosis. 4. 9 mm angiomyolipoma within the mid to upper left kidney. No follow-up imaging is recommended. This recommendation follows ACR consensus guidelines: Management of the Incidental Renal Mass on CT: A White Paper of the ACR Incidental Findings Committee. J Am Coll Radiol [phone]. Electronically  Signed   By: Virgina Norfolk M.D.   On: 09/09/2022 00:41   DG Chest Portable 1 View  Result Date: 09/08/2022 CLINICAL DATA:  Shortness of breath.  Cough. EXAM: PORTABLE CHEST 1 VIEW COMPARISON:  05/24/2022 radiograph and prior studies FINDINGS: The cardiomediastinal silhouette is unremarkable. Mild peribronchial thickening is unchanged. There is no evidence of focal airspace disease, pulmonary edema, suspicious pulmonary nodule/mass, pleural effusion, or pneumothorax. No acute bony abnormalities are identified. IMPRESSION: No active disease. Electronically Signed   By: Margarette Canada M.D.   On: 09/08/2022 21:46       The results of significant diagnostics from this hospitalization (including imaging, microbiology, ancillary and laboratory) are listed below for reference.     Microbiology: No results found for this or any previous visit (from the past 240 hour(s)).   Labs:  CBC: Recent Labs  Lab 09/08/22 2143 09/09/22 0524 09/10/22 0335  WBC 16.9* 11.7* 11.9*  NEUTROABS 13.7*  --   --   HGB 16.3* 15.9* 15.1*  HCT 46.8* 45.4 43.9  MCV 98.9 98.5 101.2*  PLT 40* 35* 29*   BMP &GFR Recent Labs  Lab 09/08/22 2143 09/09/22 0524 09/10/22 0331 09/10/22 0335  NA 130* 133* 132*  --   K 3.2* 3.4* 4.1  --   CL 92* 96* 98  --   CO2 18* 20* 23  --   GLUCOSE 152* 184* 182*  --   BUN '12 9 8  '$ --   CREATININE 0.56 0.52 0.49  --   CALCIUM 7.5* 7.3* 7.1*  --   MG  --  0.8*  --  2.1  PHOS  --   --  1.8*  --    Estimated Creatinine Clearance: 89.8 mL/min (by C-G formula based on SCr of 0.49 mg/dL). Liver & Pancreas: Recent Labs  Lab 09/08/22 2143 09/09/22 0521 09/10/22 0331 09/10/22 0335  AST 72* 105*  --  64*  ALT 39 44  --  36  ALKPHOS 173* 172*  --  130*  BILITOT 0.9 2.0*  --  1.3*  PROT 6.4* 5.9*  --  6.2*  ALBUMIN 3.3* 3.0* 3.2* 3.1*   No results for input(s): "LIPASE", "AMYLASE" in the last 168 hours. No results for input(s): "AMMONIA" in the last 168  hours. Diabetic: Recent Labs    09/09/22 0521  HGBA1C 5.7*   No results for input(s): "GLUCAP" in the last 168 hours.  Cardiac Enzymes: Recent Labs  Lab 09/09/22 0521  CKTOTAL 700*   No results for input(s): "PROBNP" in the last 8760 hours. Coagulation Profile: No results for input(s): "INR", "PROTIME" in the last 168 hours. Thyroid Function Tests: No results for input(s): "TSH", "T4TOTAL", "FREET4", "T3FREE", "THYROIDAB" in the last 72 hours. Lipid Profile: No results for input(s): "CHOL", "HDL", "LDLCALC", "TRIG", "CHOLHDL", "LDLDIRECT" in the last 72 hours. Anemia Panel: Recent Labs    09/10/22 0331 09/10/22 1001  VITAMINB12  --  413  FOLATE 3.6*  --   FERRITIN 299  --   TIBC 332  --   IRON 230*  --   RETICCTPCT 1.7  --    Urine analysis:    Component Value Date/Time   COLORURINE YELLOW (A) 09/08/2022 2143   APPEARANCEUR HAZY (A) 09/08/2022 2143   APPEARANCEUR Hazy (A) 07/17/2016 1438   LABSPEC 1.025 09/08/2022 2143   LABSPEC 1.002 10/06/2012 1138   PHURINE 5.0 09/08/2022 2143   GLUCOSEU 50 (A) 09/08/2022 2143   GLUCOSEU Negative 10/06/2012 1138   HGBUR MODERATE (A) 09/08/2022 2143   BILIRUBINUR NEGATIVE 09/08/2022 2143   BILIRUBINUR negative 05/08/2019 0929   BILIRUBINUR Negative 07/17/2016 1438   BILIRUBINUR Negative 10/06/2012 1138   KETONESUR 20 (A) 09/08/2022 2143   PROTEINUR 30 (A) 09/08/2022 2143   UROBILINOGEN 0.2 05/08/2019 0929   NITRITE NEGATIVE 09/08/2022 2143   LEUKOCYTESUR NEGATIVE 09/08/2022 2143   LEUKOCYTESUR Negative 10/06/2012 1138   Sepsis Labs: Invalid input(s): "PROCALCITONIN", "LACTICIDVEN"   SIGNED:  Mercy Riding, MD  [facility] 09/10/2022, 2:38 PM

## 2022-09-11 ENCOUNTER — Encounter: Payer: Self-pay | Admitting: Pulmonary Disease

## 2022-09-11 ENCOUNTER — Other Ambulatory Visit: Payer: Self-pay | Admitting: Pulmonary Disease

## 2022-09-11 ENCOUNTER — Telehealth: Payer: Self-pay

## 2022-09-11 MED ORDER — NICOTINE 10 MG IN INHA: 1.0000 | RESPIRATORY_TRACT | 2 refills | Status: DC | PRN

## 2022-09-11 NOTE — Transitions of Care (Post Inpatient/ED Visit) (Signed)
   09/11/2022  Name: Carly [name] MRN: [id number] DOB: 08/06/1970  Today's TOC FU Call Status: Today's TOC FU Call Status:: Successful TOC FU Call Competed TOC FU Call Complete Date: 09/11/22  Transition Care Management Follow-up Telephone Call Date of Discharge: 09/10/22 Discharge Facility: [facility]) Type of Discharge: Inpatient Admission Primary Inpatient Discharge Diagnosis:: COVID How have you been since you were released from the hospital?: Better Any questions or concerns?: No  Items Reviewed: Did you receive and understand the discharge instructions provided?: Yes Medications obtained and verified?: Yes (Medications Reviewed) Any new allergies since your discharge?: No Dietary orders reviewed?: NA Do you have support at home?: Yes People in Home: spouse  Home Care and Equipment/Supplies: [place] Ordered?: NA Any new equipment or medical supplies ordered?: NA  Functional Questionnaire: Do you need assistance with bathing/showering or dressing?: No Do you need assistance with meal preparation?: No Do you need assistance with eating?: No Do you have difficulty maintaining continence: No Do you need assistance with getting out of bed/getting out of a chair/moving?: No Do you have difficulty managing or taking your medications?: No  Folllow up appointments reviewed: PCP Follow-up appointment confirmed?: NA Specialist Hospital Follow-up appointment confirmed?: Yes Date of Specialist follow-up appointment?: 10/24/22 Follow-Up Specialty Provider:: Pulmo Do you need transportation to your follow-up appointment?: No Do you understand care options if your condition(s) worsen?: Yes-patient verbalized understanding    SIGNATURE Juanda Crumble, [place] Nurse Health Advisor Direct Dial [phone]

## 2022-09-11 NOTE — Telephone Encounter (Signed)
We could send another prescription for Nicotrol or Nicorette inhaler.

## 2022-09-13 ENCOUNTER — Telehealth: Payer: Self-pay | Admitting: Family Medicine

## 2022-09-13 NOTE — Telephone Encounter (Signed)
Patient was called and she wanted to know if she should still be taking the carvedilol, I informed her that according to her records she was taking off that medication and given the metoprolol and she understood.  Carly [name],cma

## 2022-09-13 NOTE — Telephone Encounter (Signed)
Pt would like to be called concerning her medication

## 2022-09-15 ENCOUNTER — Telehealth: Payer: BC Managed Care – PPO | Admitting: Family Medicine

## 2022-09-15 DIAGNOSIS — B3731 Acute candidiasis of vulva and vagina: Secondary | ICD-10-CM | POA: Diagnosis not present

## 2022-09-15 MED ORDER — FLUCONAZOLE 150 MG PO TABS: 150.0000 mg | ORAL_TABLET | Freq: Once | ORAL | 0 refills | Status: DC

## 2022-09-15 NOTE — Progress Notes (Signed)

## 2022-09-16 MED ORDER — FLUCONAZOLE 150 MG PO TABS: 150.0000 mg | ORAL_TABLET | Freq: Once | ORAL | 0 refills | Status: AC

## 2022-09-16 NOTE — Addendum Note (Signed)
Addended by: Geryl Rankins on: 09/16/2022 07:50 AM   Modules accepted: Orders

## 2022-09-18 ENCOUNTER — Inpatient Hospital Stay: Payer: BC Managed Care – PPO

## 2022-09-18 ENCOUNTER — Inpatient Hospital Stay: Payer: BC Managed Care – PPO | Attending: Oncology

## 2022-09-18 DIAGNOSIS — D693 Immune thrombocytopenic purpura: Secondary | ICD-10-CM | POA: Diagnosis present

## 2022-09-18 LAB — CBC WITH DIFFERENTIAL/PLATELET
Abs Immature Granulocytes: 0.03 10*3/uL (ref 0.00–0.07)
Basophils Absolute: 0.1 10*3/uL (ref 0.0–0.1)
Basophils Relative: 1 %
Eosinophils Absolute: 0.2 10*3/uL (ref 0.0–0.5)
Eosinophils Relative: 2 %
HCT: 43.5 % (ref 36.0–46.0)
Hemoglobin: 14.6 g/dL (ref 12.0–15.0)
Immature Granulocytes: 0 %
Lymphocytes Relative: 25 %
Lymphs Abs: 2.1 10*3/uL (ref 0.7–4.0)
MCH: 34.9 pg — ABNORMAL HIGH (ref 26.0–34.0)
MCHC: 33.6 g/dL (ref 30.0–36.0)
MCV: 104.1 fL — ABNORMAL HIGH (ref 80.0–100.0)
Monocytes Absolute: 0.9 10*3/uL (ref 0.1–1.0)
Monocytes Relative: 10 %
Neutro Abs: 5.2 10*3/uL (ref 1.7–7.7)
Neutrophils Relative %: 62 %
Platelets: 29 10*3/uL — ABNORMAL LOW (ref 150–400)
RBC: 4.18 MIL/uL (ref 3.87–5.11)
RDW: 14.2 % (ref 11.5–15.5)
WBC: 8.4 10*3/uL (ref 4.0–10.5)
nRBC: 0 % (ref 0.0–0.2)

## 2022-09-18 MED ORDER — ROMIPLOSTIM INJECTION 500 MCG
6.0000 ug/kg | Freq: Once | SUBCUTANEOUS | Status: AC
  Administered 2022-09-18: 575 ug via SUBCUTANEOUS
  Filled 2022-09-18: qty 1.15

## 2022-09-27 ENCOUNTER — Encounter: Payer: Self-pay | Admitting: Oncology

## 2022-10-01 ENCOUNTER — Encounter: Payer: Self-pay | Admitting: Family Medicine

## 2022-10-01 ENCOUNTER — Other Ambulatory Visit: Payer: Self-pay | Admitting: Family Medicine

## 2022-10-01 DIAGNOSIS — F419 Anxiety disorder, unspecified: Secondary | ICD-10-CM

## 2022-10-01 DIAGNOSIS — F39 Unspecified mood [affective] disorder: Secondary | ICD-10-CM

## 2022-10-01 MED ORDER — ALPRAZOLAM 0.25 MG PO TABS: 0.2500 mg | ORAL_TABLET | Freq: Every day | ORAL | 0 refills | Status: DC | PRN

## 2022-10-18 ENCOUNTER — Ambulatory Visit: Payer: BC Managed Care – PPO | Admitting: Pulmonary Disease

## 2022-10-26 ENCOUNTER — Encounter: Payer: Self-pay | Admitting: Pulmonary Disease

## 2022-10-30 ENCOUNTER — Inpatient Hospital Stay: Payer: BC Managed Care – PPO

## 2022-10-30 ENCOUNTER — Inpatient Hospital Stay: Payer: BC Managed Care – PPO | Admitting: Oncology

## 2022-11-01 ENCOUNTER — Ambulatory Visit: Payer: BC Managed Care – PPO

## 2022-11-01 ENCOUNTER — Telehealth: Payer: Self-pay

## 2022-11-01 NOTE — Telephone Encounter (Signed)
Patient is scheduled for ROV 11/02/2022. PFT has not been completed.  ATC patient to ask if she wants to reschedule appt until PFT is completed.

## 2022-11-02 ENCOUNTER — Ambulatory Visit: Payer: BC Managed Care – PPO | Admitting: Primary Care

## 2022-11-02 NOTE — Telephone Encounter (Signed)
Spoke to patient. She stated that she would like to postpone OV until PFT has been completed.  11/02/22 OV canceled.   Synetta Fail, please schedule PFT and let me know once schedule so I can call patient back to reschedule OV.

## 2022-11-02 NOTE — Telephone Encounter (Signed)
I tried to contact the patient right after Delray Alt spoke with her and she didn't answer the phone. Margie called her back and transferred her to me to schedule her PFT when I told her what dates and times that I had available she wanted to schedule the 1st week of May. I gave her my direct phone number to call me back at that time to schedule her PFT

## 2022-11-05 ENCOUNTER — Telehealth: Payer: Self-pay | Admitting: Oncology

## 2022-11-05 NOTE — Telephone Encounter (Signed)
This patient called to r/s her appointment form 4/26 to 4/30. She has lab/md/inj. Please advise on scheduling- Thank you

## 2022-11-05 NOTE — Telephone Encounter (Signed)
Yes that will be fine. 

## 2022-11-06 ENCOUNTER — Other Ambulatory Visit: Payer: Self-pay | Admitting: Podiatry

## 2022-11-06 ENCOUNTER — Telehealth: Payer: Self-pay | Admitting: Family Medicine

## 2022-11-06 ENCOUNTER — Other Ambulatory Visit (INDEPENDENT_AMBULATORY_CARE_PROVIDER_SITE_OTHER): Payer: BC Managed Care – PPO

## 2022-11-06 DIAGNOSIS — R7309 Other abnormal glucose: Secondary | ICD-10-CM

## 2022-11-06 DIAGNOSIS — E785 Hyperlipidemia, unspecified: Secondary | ICD-10-CM

## 2022-11-06 DIAGNOSIS — E538 Deficiency of other specified B group vitamins: Secondary | ICD-10-CM

## 2022-11-06 DIAGNOSIS — D693 Immune thrombocytopenic purpura: Secondary | ICD-10-CM

## 2022-11-06 DIAGNOSIS — E559 Vitamin D deficiency, unspecified: Secondary | ICD-10-CM | POA: Diagnosis not present

## 2022-11-06 DIAGNOSIS — I10 Essential (primary) hypertension: Secondary | ICD-10-CM | POA: Diagnosis not present

## 2022-11-06 LAB — VITAMIN D 25 HYDROXY (VIT D DEFICIENCY, FRACTURES): VITD: 47.02 ng/mL (ref 30.00–100.00)

## 2022-11-06 LAB — CBC WITH DIFFERENTIAL/PLATELET
Basophils Absolute: 0.1 10*3/uL (ref 0.0–0.1)
Basophils Relative: 1.5 % (ref 0.0–3.0)
Eosinophils Absolute: 0.2 10*3/uL (ref 0.0–0.7)
Eosinophils Relative: 3.4 % (ref 0.0–5.0)
HCT: 48.1 % — ABNORMAL HIGH (ref 36.0–46.0)
Hemoglobin: 16.2 g/dL — ABNORMAL HIGH (ref 12.0–15.0)
Lymphocytes Relative: 18.1 % (ref 12.0–46.0)
Lymphs Abs: 1 10*3/uL (ref 0.7–4.0)
MCHC: 33.7 g/dL (ref 30.0–36.0)
MCV: 103.3 fl — ABNORMAL HIGH (ref 78.0–100.0)
Monocytes Absolute: 0.6 10*3/uL (ref 0.1–1.0)
Monocytes Relative: 11.6 % (ref 3.0–12.0)
Neutro Abs: 3.6 10*3/uL (ref 1.4–7.7)
Neutrophils Relative %: 65.4 % (ref 43.0–77.0)
Platelets: 17 10*3/uL — CL (ref 150.0–400.0)
RBC: 4.66 Mil/uL (ref 3.87–5.11)
RDW: 14.4 % (ref 11.5–15.5)
WBC: 5.5 10*3/uL (ref 4.0–10.5)

## 2022-11-06 LAB — TSH: TSH: 2.03 u[IU]/mL (ref 0.35–5.50)

## 2022-11-06 LAB — COMPREHENSIVE METABOLIC PANEL
ALT: 37 U/L — ABNORMAL HIGH (ref 0–35)
AST: 70 U/L — ABNORMAL HIGH (ref 0–37)
Albumin: 3.7 g/dL (ref 3.5–5.2)
Alkaline Phosphatase: 123 U/L — ABNORMAL HIGH (ref 39–117)
BUN: 2 mg/dL — ABNORMAL LOW (ref 6–23)
CO2: 29 mEq/L (ref 19–32)
Calcium: 8.1 mg/dL — ABNORMAL LOW (ref 8.4–10.5)
Chloride: 98 mEq/L (ref 96–112)
Creatinine, Ser: 0.66 mg/dL (ref 0.40–1.20)
GFR: 101.66 mL/min (ref >60.00)
Glucose, Bld: 115 mg/dL — ABNORMAL HIGH (ref 70–99)
Potassium: 3.7 mEq/L (ref 3.5–5.1)
Sodium: 139 mEq/L (ref 135–145)
Total Bilirubin: 0.6 mg/dL (ref 0.2–1.2)
Total Protein: 6.1 g/dL (ref 6.0–8.3)

## 2022-11-06 LAB — LIPID PANEL
Cholesterol: 170 mg/dL (ref 0–200)
HDL: 46.2 mg/dL (ref >39.00)
LDL Cholesterol: 103 mg/dL — ABNORMAL HIGH (ref 0–99)
NonHDL: 123.34
Total CHOL/HDL Ratio: 4
Triglycerides: 101 mg/dL (ref 0.0–149.0)
VLDL: 20.2 mg/dL (ref 0.0–40.0)

## 2022-11-06 LAB — HEMOGLOBIN A1C: Hgb A1c MFr Bld: 5.4 % (ref 4.6–6.5)

## 2022-11-06 LAB — VITAMIN B12: Vitamin B-12: 587 pg/mL (ref 211–911)

## 2022-11-06 NOTE — Telephone Encounter (Signed)
CRITICAL VALUE STICKER  CRITICAL VALUE: Low Platelet  of [id number]  RECEIVER Henrene Pastor [employer].  DATE & TIME NOTIFIED: 11/06/22  MESSENGER [facility]  MD NOTIFIED: Dr. Clent Ridges  TIME OF NOTIFICATION: 3:24  RESPONSE:

## 2022-11-06 NOTE — Telephone Encounter (Signed)
Spoke with patient. Asymptomatic.  Was scheduled for NPLATE infusion 16/10 but she changed to  04/30 due to work. She is aware to try to reschedule to original time.  Follows with Oncology for Chronic ITP.  Reached out to notify Dr Orlie Dakin and no further treatment required at this time.

## 2022-11-09 ENCOUNTER — Ambulatory Visit: Payer: BC Managed Care – PPO

## 2022-11-09 ENCOUNTER — Other Ambulatory Visit: Payer: BC Managed Care – PPO

## 2022-11-09 ENCOUNTER — Ambulatory Visit: Payer: BC Managed Care – PPO | Admitting: Oncology

## 2022-11-12 NOTE — Progress Notes (Deleted)
   SUBJECTIVE:  No chief complaint on file.  HPI ***  PERTINENT PMH / PSH: Hepatic Steatosis Mood Disorder Pulmonary nodules HTN Emphysema Chronic ITP   OBJECTIVE:  There were no vitals taken for this visit.   Physical Exam  ASSESSMENT/PLAN:  Elevated liver enzymes  Hepatic steatosis  Pulmonary nodules  Hypomagnesemia   PDMP reviewed***  No follow-ups on file.  Dana Allan, MD

## 2022-11-13 ENCOUNTER — Encounter: Payer: BC Managed Care – PPO | Admitting: Family Medicine

## 2022-11-13 ENCOUNTER — Inpatient Hospital Stay: Payer: BC Managed Care – PPO | Attending: Oncology

## 2022-11-13 ENCOUNTER — Encounter: Payer: Self-pay | Admitting: Oncology

## 2022-11-13 ENCOUNTER — Inpatient Hospital Stay (HOSPITAL_BASED_OUTPATIENT_CLINIC_OR_DEPARTMENT_OTHER): Payer: BC Managed Care – PPO | Admitting: Oncology

## 2022-11-13 ENCOUNTER — Inpatient Hospital Stay: Payer: BC Managed Care – PPO

## 2022-11-13 VITALS — BP 128/83 | HR 97 | Temp 97.9°F | Resp 16 | Ht 62.0 in | Wt 204.8 lb

## 2022-11-13 DIAGNOSIS — D693 Immune thrombocytopenic purpura: Secondary | ICD-10-CM

## 2022-11-13 DIAGNOSIS — R748 Abnormal levels of other serum enzymes: Secondary | ICD-10-CM

## 2022-11-13 DIAGNOSIS — K760 Fatty (change of) liver, not elsewhere classified: Secondary | ICD-10-CM

## 2022-11-13 DIAGNOSIS — R918 Other nonspecific abnormal finding of lung field: Secondary | ICD-10-CM

## 2022-11-13 LAB — CBC WITH DIFFERENTIAL/PLATELET
Abs Immature Granulocytes: 0.04 10*3/uL (ref 0.00–0.07)
Basophils Absolute: 0.1 10*3/uL (ref 0.0–0.1)
Basophils Relative: 1 %
Eosinophils Absolute: 0.3 10*3/uL (ref 0.0–0.5)
Eosinophils Relative: 3 %
HCT: 48.5 % — ABNORMAL HIGH (ref 36.0–46.0)
Hemoglobin: 16.3 g/dL — ABNORMAL HIGH (ref 12.0–15.0)
Immature Granulocytes: 0 %
Lymphocytes Relative: 22 %
Lymphs Abs: 2 10*3/uL (ref 0.7–4.0)
MCH: 34.4 pg — ABNORMAL HIGH (ref 26.0–34.0)
MCHC: 33.6 g/dL (ref 30.0–36.0)
MCV: 102.3 fL — ABNORMAL HIGH (ref 80.0–100.0)
Monocytes Absolute: 0.9 10*3/uL (ref 0.1–1.0)
Monocytes Relative: 9 %
Neutro Abs: 5.9 10*3/uL (ref 1.7–7.7)
Neutrophils Relative %: 65 %
Platelets: 66 10*3/uL — ABNORMAL LOW (ref 150–400)
RBC: 4.74 MIL/uL (ref 3.87–5.11)
RDW: 13.6 % (ref 11.5–15.5)
WBC: 9.2 10*3/uL (ref 4.0–10.5)
nRBC: 0 % (ref 0.0–0.2)

## 2022-11-13 MED ORDER — ROMIPLOSTIM INJECTION 500 MCG
6.0000 ug/kg | Freq: Once | SUBCUTANEOUS | Status: AC
  Administered 2022-11-13: 555 ug via SUBCUTANEOUS
  Filled 2022-11-13: qty 1.11

## 2022-11-13 NOTE — Progress Notes (Signed)
[facility]  Telephone:([phone]) [phone] Fax:([phone]) [phone]  ID: Carly [name] OB: Nov 24, 1970  MR#: [id number]  AOZ#:[id number]  Patient Care Team: Dana Allan, MD as PCP - General (Family Medicine) Jeralyn Ruths, MD as Consulting Physician (Oncology) Salena Saner, MD as Consulting Physician (Pulmonary Disease)   CHIEF COMPLAINT: ITP.  INTERVAL HISTORY: Patient returns to clinic today for repeat laboratory work, further evaluation, and continuation of Nplate.  She was admitted to the hospital in February 2024 with COVID, but otherwise is felt well.  She currently feels well and is asymptomatic.  She does not complain of any increased bleeding or bruising today. She has no neurologic complaints. She has a good appetite and denies weight loss.  She denies any chest pain, shortness of breath, cough, or hemoptysis. She denies any nausea, vomiting, constipation, or diarrhea.  She has no urinary complaints.  Patient offers no further specific complaints today.  REVIEW OF SYSTEMS:   Review of Systems  Constitutional: Negative.  Negative for fever, malaise/fatigue and weight loss.  Respiratory: Negative.  Negative for cough, hemoptysis and shortness of breath.   Cardiovascular: Negative.  Negative for chest pain and leg swelling.  Gastrointestinal: Negative.  Negative for abdominal pain, blood in stool and melena.  Genitourinary: Negative.  Negative for hematuria.  Musculoskeletal: Negative.  Negative for back pain.  Skin: Negative.  Negative for rash.  Neurological: Negative.  Negative for dizziness, focal weakness, weakness and headaches.  Endo/Heme/Allergies: Negative.  Does not bruise/bleed easily.  Psychiatric/Behavioral: Negative.  The patient is not nervous/anxious.     As per HPI. Otherwise, a complete review of systems is negative.  PAST MEDICAL HISTORY: Past Medical History:  Diagnosis Date   Abnormal uterine bleeding (AUB) 08/26/2018   Anxiety    C.  difficile diarrhea    07/04/21   Chicken pox    Depression    Depression, major, single episode, moderate (HCC) 08/04/2018   Diarrhea 06/07/2022   Dyspnea on exertion 05/24/2022   Elevated testosterone level in female    Emphysema of lung (HCC)    bronchitis   Frequent headaches    Generalized anxiety disorder 08/23/2014   Hay fever    Hypertension    Idiopathic thrombocytopenic purpura (ITP) (HCC)    Iron deficiency 09/02/2017   Positive ANA (antinuclear antibody)    Thrombocytopenia (HCC) 02/12/2016    PAST SURGICAL HISTORY: Past Surgical History:  Procedure Laterality Date   HEMATOMA EVACUATION N/A 04/07/2020   Procedure: EVACUATION HEMATOMA  AND APPLICATION OF PRESSURE DRESSING;  Surgeon: Christeen Douglas, MD;  Location: [facility] OR[facility];  Service: Gynecology;  Laterality: N/A;   HYSTEROSCOPY WITH NOVASURE N/A 02/21/2018   Procedure: HYSTEROSCOPY WITH NOVASURE, POLYPECTOMY;  Surgeon: Christeen Douglas, MD;  Location: [facility];  Service: Gynecology;  Laterality: N/A;   TUBAL LIGATION  1997   TUBAL LIGATION      FAMILY HISTORY: Family History  Problem Relation Age of Onset   Hyperlipidemia Mother    Heart disease Mother    Stroke Mother    Depression Mother    Mental illness Mother    Heart attack Mother 18   Hyperlipidemia Father    Depression Father    Mental illness Father    Diabetes Paternal Grandfather    Cancer Cousin     ADVANCED DIRECTIVES (Y/N):  N  HEALTH MAINTENANCE: Social History   Tobacco Use   Smoking status: Every Day    Packs/day: 1.50    Years: 28.00    Additional  pack years: 0.00    Total pack years: 42.00    Types: Cigarettes   Smokeless tobacco: Never   Tobacco comments:    1.5 ppd increase d/t death of sister.  9/6 hfb    0.5 PPD 05/24/2022-khj  Vaping Use   Vaping Use: Never used  Substance Use Topics   Alcohol use: Yes    Alcohol/week: 8.0 standard drinks of alcohol    Types: 8 Standard drinks or equivalent per week    Comment: Beer  (Can)    Drug use: No    Comment: CBD oil     Colonoscopy:  PAP:  Bone density:  Lipid panel:  Allergies  Allergen Reactions   Wellbutrin [Bupropion]     Crazy    Meloxicam Nausea Only    Current Outpatient Medications  Medication Sig Dispense Refill   acetaminophen (TYLENOL) 500 MG tablet Take 500 mg by mouth every 6 (six) hours as needed.     albuterol (PROVENTIL) (2.5 MG/3ML) 0.083% nebulizer solution Take 3 mLs (2.5 mg total) by nebulization every 6 (six) hours as needed for wheezing or shortness of breath. 360 mL 1   albuterol (VENTOLIN HFA) 108 (90 Base) MCG/ACT inhaler INHALE 2 PUFFS INTO THE LUNGS EVERY 6 HOURS AS NEEDED FOR WHEEZING OR SHORTNESS OF BREATH (COUGH) 6.7 each 3   ALPRAZolam (XANAX) 0.25 MG tablet Take 1 tablet (0.25 mg total) by mouth daily as needed for anxiety. 30 tablet 0   amLODipine (NORVASC) 10 MG tablet Take 1 tablet (10 mg total) by mouth daily. 90 tablet 3   ARIPiprazole (ABILIFY) 2 MG tablet Take 1 tablet (2 mg total) by mouth daily. Dc rexulti not covered 90 tablet 3   benzonatate (TESSALON) 100 MG capsule Take 1 capsule (100 mg total) by mouth 3 (three) times daily as needed for cough. 20 capsule 0   BREZTRI AEROSPHERE 160-9-4.8 MCG/ACT AERO INHALE 2 PUFFS INTO THE LUNGS IN THE MORNING AND AT BEDTIME. 10.7 each 5   celecoxib (CELEBREX) 50 MG capsule Take 50 mg by mouth 2 (two) times daily.     Cholecalciferol 1.25 MG (50000 UT) capsule Take 1 capsule (50,000 Units total) by mouth every 30 (thirty) days. 1x per month note this 13 capsule 0   cyanocobalamin (VITAMIN B12) 1000 MCG/ML injection INJECT 1 ML (1,000 MCG TOTAL) INTO THE MUSCLE EVERY 30 DAYS. 1 mL 15   diclofenac Sodium (VOLTAREN) 1 % GEL Apply 2-4 g topically 4 (four) times daily. 2 grams upper body qid prn and 4 gram lower body qid prn 150 g 11   escitalopram (LEXAPRO) 20 MG tablet TAKE 1 TABLET BY MOUTH EVERY DAY 90 tablet 4   fluticasone (FLONASE) 50 MCG/ACT nasal spray Place 2 sprays  into both nostrils daily. 16 g 0   folic acid (FOLVITE) 1 MG tablet Take 1 tablet (1 mg total) by mouth daily. 90 tablet 0   levocetirizine (XYZAL) 5 MG tablet Take 1 tablet (5 mg total) by mouth every evening. 30 tablet 0   metoprolol succinate (TOPROL-XL) 100 MG 24 hr tablet Take 1 tablet (100 mg total) by mouth daily. Take with or immediately following a meal. 30 tablet 2   nicotine (NICODERM CQ - DOSED IN MG/24 HOURS) 21 mg/24hr patch Place 1 patch (21 mg total) onto the skin daily. 28 patch 0   nicotine (NICOTROL) 10 MG inhaler Inhale 1 Cartridge (1 continuous puffing total) into the lungs as needed for smoking cessation (As needed for smoking cessation, No  more than 16 cartridges per day). 36 each 2   omeprazole (PRILOSEC) 20 MG capsule TAKE 1 CAPSULE BY MOUTH EVERY DAY 90 capsule 3   traZODone (DESYREL) 50 MG tablet TAKE 0.5-1 TABLETS BY MOUTH AT BEDTIME AS NEEDED FOR SLEEP. 90 tablet 3   No current facility-administered medications for this visit.   Facility-Administered Medications Ordered in Other Visits  Medication Dose Route Frequency Provider Last Rate Last Admin   romiPLOStim (NPLATE) injection 555 mcg  6 mcg/kg Subcutaneous Once Alinda Dooms, NP        OBJECTIVE: Vitals:   11/13/22 1044  BP: 128/83  Pulse: 97  Resp: 16  Temp: 97.9 F (36.6 C)  SpO2: 98%     Body mass index is 37.46 kg/m.    ECOG FS:0 - Asymptomatic  General: Well-developed, well-nourished, no acute distress. Eyes: Pink conjunctiva, anicteric sclera. HEENT: Normocephalic, moist mucous membranes. Lungs: No audible wheezing or coughing. Heart: Regular rate and rhythm. Abdomen: Soft, nontender, no obvious distention. Musculoskeletal: No edema, cyanosis, or clubbing. Neuro: Alert, answering all questions appropriately. Cranial nerves grossly intact. Skin: No rashes or petechiae noted. Psych: Normal affect.  LAB RESULTS:  Lab Results  Component Value Date   NA 139 11/06/2022   K 3.7 11/06/2022    CL 98 11/06/2022   CO2 29 11/06/2022   GLUCOSE 115 (H) 11/06/2022   BUN 2 (L) 11/06/2022   CREATININE 0.66 11/06/2022   CALCIUM 8.1 (L) 11/06/2022   PROT 6.1 11/06/2022   ALBUMIN 3.7 11/06/2022   AST 70 (H) 11/06/2022   ALT 37 (H) 11/06/2022   ALKPHOS 123 (H) 11/06/2022   BILITOT 0.6 11/06/2022   GFRNONAA >60 09/10/2022   GFRAA >60 04/08/2020    Lab Results  Component Value Date   WBC 9.2 11/13/2022   NEUTROABS 5.9 11/13/2022   HGB 16.3 (H) 11/13/2022   HCT 48.5 (H) 11/13/2022   MCV 102.3 (H) 11/13/2022   PLT 66 (L) 11/13/2022     STUDIES: No results found.  ASSESSMENT: ITP  PLAN:    ITP: Confirmed with normal bone marrow biopsy and normal FISH and cytogenetics on July 22, 2019.  Patient had a positive ANA which is likely clinically insignificant.  The remainder of her laboratory work was either negative or within normal limits. CT scan on March 04, 2017 did not reveal splenomegaly. Previously, patient received 1 week of prednisone at 1 mg/kg dose and had no appreciable change in her platelet count.  Rituxan was denied by insurance. In January 2024 patient's platelet count increased from 38-191 with 6 mcg/kg of Nplate.  Patient's current platelet count is 66 therefore will proceed with treatment today.  Return to clinic in 6 weeks for laboratory work and Nplate only.  Patient will then return to clinic in 3 months for laboratory work, further evaluation, and continuation of treatment.  History of positive ANA: Likely clinically insignificant.  Referral was previously sent to rheumatology. COVID: Resolved.  I spent a total of 30 minutes reviewing chart data, face-to-face evaluation with the patient, counseling and coordination of care as detailed above.    Patient expressed understanding and was in agreement with this plan. She also understands that She can call clinic at any time with any questions, concerns, or complaints.    Jeralyn Ruths, MD   11/13/2022  11:08 AM

## 2022-11-19 NOTE — Telephone Encounter (Signed)
Anita, please advise. Thanks 

## 2022-11-20 ENCOUNTER — Other Ambulatory Visit: Payer: Self-pay | Admitting: Family Medicine

## 2022-11-20 DIAGNOSIS — F39 Unspecified mood [affective] disorder: Secondary | ICD-10-CM

## 2022-11-20 MED ORDER — ALPRAZOLAM 0.25 MG PO TABS: 0.2500 mg | ORAL_TABLET | Freq: Every day | ORAL | 0 refills | Status: DC | PRN

## 2022-11-22 NOTE — Telephone Encounter (Signed)
I called the patient to schedule her PFT and to make sure the call would go through I called from the nursing station.  Carly [name] PFT has been scheduled on 12/04/22 @4 :00pm at [facility]

## 2022-11-26 ENCOUNTER — Encounter: Payer: Self-pay | Admitting: Family Medicine

## 2022-11-26 DIAGNOSIS — F39 Unspecified mood [affective] disorder: Secondary | ICD-10-CM

## 2022-11-26 DIAGNOSIS — F32A Depression, unspecified: Secondary | ICD-10-CM

## 2022-11-26 NOTE — Telephone Encounter (Signed)
LOV 08/06/22  Please advise on referral request, Thanks!

## 2022-11-28 ENCOUNTER — Ambulatory Visit: Payer: BC Managed Care – PPO | Attending: Pulmonary Disease

## 2022-12-04 ENCOUNTER — Ambulatory Visit: Payer: BC Managed Care – PPO

## 2022-12-17 ENCOUNTER — Telehealth: Payer: BC Managed Care – PPO | Admitting: Physician Assistant

## 2022-12-17 DIAGNOSIS — J4521 Mild intermittent asthma with (acute) exacerbation: Secondary | ICD-10-CM

## 2022-12-17 MED ORDER — PREDNISONE 20 MG PO TABS: 40.0000 mg | ORAL_TABLET | Freq: Every day | ORAL | 0 refills | Status: DC

## 2022-12-17 MED ORDER — BENZONATATE 100 MG PO CAPS: 100.0000 mg | ORAL_CAPSULE | Freq: Three times a day (TID) | ORAL | 0 refills | Status: DC | PRN

## 2022-12-17 NOTE — Progress Notes (Signed)
I have spent 5 minutes in review of e-visit questionnaire, review and updating patient chart, medical decision making and response to patient.   Carly [name] Carly Jnya Brossard, PA-C    

## 2022-12-17 NOTE — Progress Notes (Signed)
E-Visit for Cough  We are sorry that you are not feeling well.  Here is how we plan to help!  Based on your presentation I believe you most likely have A cough due to a virus.  This is called viral bronchitis and is best treated by rest, plenty of fluids and control of the cough.  You may use Ibuprofen or Tylenol as directed to help your symptoms.  Because this is exacerbating your breathing, I have prescribed a short course of prednisone along with a prescription cough medication.    From your responses in the eVisit questionnaire you describe inflammation in the upper respiratory tract which is causing a significant cough.  This is commonly called Bronchitis and has four common causes:   Allergies Viral Infections Acid Reflux Bacterial Infection Allergies, viruses and acid reflux are treated by controlling symptoms or eliminating the cause. An example might be a cough caused by taking certain blood pressure medications. You stop the cough by changing the medication. Another example might be a cough caused by acid reflux. Controlling the reflux helps control the cough.  USE OF BRONCHODILATOR ("RESCUE") INHALERS: There is a risk from using your bronchodilator too frequently.  The risk is that over-reliance on a medication which only relaxes the muscles surrounding the breathing tubes can reduce the effectiveness of medications prescribed to reduce swelling and congestion of the tubes themselves.  Although you feel brief relief from the bronchodilator inhaler, your asthma may actually be worsening with the tubes becoming more swollen and filled with mucus.  This can delay other crucial treatments, such as oral steroid medications. If you need to use a bronchodilator inhaler daily, several times per day, you should discuss this with your provider.  There are probably better treatments that could be used to keep your asthma under control.     HOME CARE Only take medications as instructed by your  medical team. Complete the entire course of an antibiotic. Drink plenty of fluids and get plenty of rest. Avoid close contacts especially the very young and the elderly Cover your mouth if you cough or cough into your sleeve. Always remember to wash your hands A steam or ultrasonic humidifier can help congestion.   GET HELP RIGHT AWAY IF: You develop worsening fever. You become short of breath You cough up blood. Your symptoms persist after you have completed your treatment plan MAKE SURE YOU  Understand these instructions. Will watch your condition. Will get help right away if you are not doing well or get worse.    Thank you for choosing an e-visit.  Your e-visit answers were reviewed by a board certified advanced clinical practitioner to complete your personal care plan. Depending upon the condition, your plan could have included both over the counter or prescription medications.  Please review your pharmacy choice. Make sure the pharmacy is open so you can pick up prescription now. If there is a problem, you may contact your provider through [employer] and have the prescription routed to another pharmacy.  Your safety is important to [place]. If you have drug allergies check your prescription carefully.   For the next 24 hours you can use MyChart to ask questions about today's visit, request a non-urgent call back, or ask for a work or school excuse. You will get an email in the next two days asking about your experience. I hope that your e-visit has been valuable and will speed your recovery.

## 2022-12-25 ENCOUNTER — Inpatient Hospital Stay: Payer: BC Managed Care – PPO

## 2022-12-25 ENCOUNTER — Inpatient Hospital Stay: Payer: BC Managed Care – PPO | Attending: Oncology

## 2022-12-25 ENCOUNTER — Other Ambulatory Visit: Payer: Self-pay | Admitting: Oncology

## 2022-12-25 VITALS — Ht 62.0 in | Wt 207.0 lb

## 2022-12-25 DIAGNOSIS — D693 Immune thrombocytopenic purpura: Secondary | ICD-10-CM

## 2022-12-25 LAB — CBC WITH DIFFERENTIAL/PLATELET
Abs Immature Granulocytes: 0.02 10*3/uL (ref 0.00–0.07)
Basophils Absolute: 0.1 10*3/uL (ref 0.0–0.1)
Basophils Relative: 1 %
Eosinophils Absolute: 0.3 10*3/uL (ref 0.0–0.5)
Eosinophils Relative: 3 %
HCT: 42.6 % (ref 36.0–46.0)
Hemoglobin: 14.4 g/dL (ref 12.0–15.0)
Immature Granulocytes: 0 %
Lymphocytes Relative: 24 %
Lymphs Abs: 1.9 10*3/uL (ref 0.7–4.0)
MCH: 34.2 pg — ABNORMAL HIGH (ref 26.0–34.0)
MCHC: 33.8 g/dL (ref 30.0–36.0)
MCV: 101.2 fL — ABNORMAL HIGH (ref 80.0–100.0)
Monocytes Absolute: 0.7 10*3/uL (ref 0.1–1.0)
Monocytes Relative: 9 %
Neutro Abs: 5 10*3/uL (ref 1.7–7.7)
Neutrophils Relative %: 63 %
Platelets: 31 10*3/uL — ABNORMAL LOW (ref 150–400)
RBC: 4.21 MIL/uL (ref 3.87–5.11)
RDW: 14.5 % (ref 11.5–15.5)
WBC: 7.9 10*3/uL (ref 4.0–10.5)
nRBC: 0 % (ref 0.0–0.2)

## 2022-12-25 MED ORDER — ROMIPLOSTIM INJECTION 500 MCG
6.0000 ug/kg | Freq: Once | SUBCUTANEOUS | Status: AC
  Administered 2022-12-25: 565 ug via SUBCUTANEOUS
  Filled 2022-12-25: qty 1

## 2023-01-03 ENCOUNTER — Other Ambulatory Visit: Payer: Self-pay | Admitting: Family Medicine

## 2023-01-03 DIAGNOSIS — F39 Unspecified mood [affective] disorder: Secondary | ICD-10-CM

## 2023-01-03 MED ORDER — ALPRAZOLAM 0.25 MG PO TABS: 0.2500 mg | ORAL_TABLET | Freq: Every day | ORAL | 0 refills | Status: DC | PRN

## 2023-01-03 NOTE — Telephone Encounter (Signed)
Spoke with pt and scheduled her for an appt on 01/29/2023.

## 2023-01-08 ENCOUNTER — Telehealth: Payer: BC Managed Care – PPO | Admitting: Physician Assistant

## 2023-01-08 DIAGNOSIS — M5440 Lumbago with sciatica, unspecified side: Secondary | ICD-10-CM | POA: Diagnosis not present

## 2023-01-08 MED ORDER — ETODOLAC 200 MG PO CAPS: 200.0000 mg | ORAL_CAPSULE | Freq: Two times a day (BID) | ORAL | 0 refills | Status: DC

## 2023-01-08 MED ORDER — CYCLOBENZAPRINE HCL 10 MG PO TABS: 10.0000 mg | ORAL_TABLET | Freq: Three times a day (TID) | ORAL | 0 refills | Status: DC | PRN

## 2023-01-08 NOTE — Progress Notes (Signed)
I have spent 5 minutes in review of e-visit questionnaire, review and updating patient chart, medical decision making and response to patient.   Geffrey Michaelsen Cody Brysten Reister, PA-C    

## 2023-01-08 NOTE — Progress Notes (Signed)

## 2023-01-09 MED ORDER — NAPROXEN 500 MG PO TABS: 500.0000 mg | ORAL_TABLET | Freq: Two times a day (BID) | ORAL | 0 refills | Status: DC

## 2023-01-09 NOTE — Addendum Note (Signed)
Addended by: [name] on: 01/09/2023 06:48 AM   Modules accepted: Orders

## 2023-01-16 ENCOUNTER — Telehealth: Payer: BC Managed Care – PPO | Admitting: Family Medicine

## 2023-01-16 DIAGNOSIS — R197 Diarrhea, unspecified: Secondary | ICD-10-CM | POA: Diagnosis not present

## 2023-01-16 NOTE — Progress Notes (Signed)
E-Visit for Diarrhea  We are sorry that you are not feeling well.  Here is how we plan to help!  Based on what you have shared with me it looks like you have Acute Infectious Diarrhea.  Most cases of acute diarrhea are due to infections with virus and bacteria and are self-limited conditions lasting less than 14 days.  For your symptoms you may take Imodium 2 mg tablets that are over the counter at your local pharmacy. Take two tablet now and then one after each loose stool up to 6 a day.  Antibiotics are not needed for most people with diarrhea.  Given this, the current antibiotics you are on could cause you to have diarrhea- this is a common side effect that will not last.   HOME CARE We recommend changing your diet to help with your symptoms for the next few days. Drink plenty of fluids that contain water salt and sugar. Sports drinks such as Gatorade may help.  You may try broths, soups, bananas, applesauce, soft breads, mashed potatoes or crackers.  You are considered infectious for as long as the diarrhea continues. Hand washing or use of alcohol based hand sanitizers is recommend. It is best to stay out of work or school until your symptoms stop.   GET HELP RIGHT AWAY If you have dark yellow colored urine or do not pass urine frequently you should drink more fluids.   If your symptoms worsen  If you feel like you are going to pass out (faint) You have a new problem  MAKE SURE YOU  Understand these instructions. Will watch your condition. Will get help right away if you are not doing well or get worse.  Thank you for choosing an e-visit.  Your e-visit answers were reviewed by a board certified advanced clinical practitioner to complete your personal care plan. Depending upon the condition, your plan could have included both over the counter or prescription medications.  Please review your pharmacy choice. Make sure the pharmacy is open so you can pick up prescription now. If  there is a problem, you may contact your provider through [employer] and have the prescription routed to another pharmacy.  Your safety is important to [place]. If you have drug allergies check your prescription carefully.   For the next 24 hours you can use MyChart to ask questions about today's visit, request a non-urgent call back, or ask for a work or school excuse. You will get an email in the next two days asking about your experience. I hope that your e-visit has been valuable and will speed your recovery.  I provided 5 minutes of non face-to-face time during this encounter for chart review, medication and order placement, as well as and documentation.

## 2023-01-19 ENCOUNTER — Telehealth: Payer: BC Managed Care – PPO | Admitting: Family Medicine

## 2023-01-19 DIAGNOSIS — J441 Chronic obstructive pulmonary disease with (acute) exacerbation: Secondary | ICD-10-CM | POA: Diagnosis not present

## 2023-01-19 MED ORDER — DOXYCYCLINE HYCLATE 100 MG PO TABS: 100.0000 mg | ORAL_TABLET | Freq: Two times a day (BID) | ORAL | 0 refills | Status: AC

## 2023-01-19 NOTE — Patient Instructions (Signed)
Carly [name], thank you for joining Reed Pandy, PA-C for today's virtual visit.  While this provider is not your primary care provider (PCP), if your PCP is located in our provider database this encounter information will be shared with them immediately following your visit.   A  MyChart account gives you access to today's visit and all your visits, tests, and labs performed at [facility] " click here if you don't have a  MyChart account or go to [email or web address]  Consent: (Patient) Carly [name] provided verbal consent for this virtual visit at the beginning of the encounter.  Current Medications:  Current Outpatient Medications:    doxycycline (VIBRA-TABS) 100 MG tablet, Take 1 tablet (100 mg total) by mouth 2 (two) times daily for 7 days., Disp: 14 tablet, Rfl: 0   acetaminophen (TYLENOL) 500 MG tablet, Take 500 mg by mouth every 6 (six) hours as needed., Disp: , Rfl:    albuterol (PROVENTIL) (2.5 MG/3ML) 0.083% nebulizer solution, Take 3 mLs (2.5 mg total) by nebulization every 6 (six) hours as needed for wheezing or shortness of breath., Disp: 360 mL, Rfl: 1   albuterol (VENTOLIN HFA) 108 (90 Base) MCG/ACT inhaler, INHALE 2 PUFFS INTO THE LUNGS EVERY 6 HOURS AS NEEDED FOR WHEEZING OR SHORTNESS OF BREATH (COUGH), Disp: 6.7 each, Rfl: 3   ALPRAZolam (XANAX) 0.25 MG tablet, Take 1 tablet (0.25 mg total) by mouth daily as needed for anxiety., Disp: 30 tablet, Rfl: 0   amLODipine (NORVASC) 10 MG tablet, Take 1 tablet (10 mg total) by mouth daily., Disp: 90 tablet, Rfl: 3   ARIPiprazole (ABILIFY) 2 MG tablet, Take 1 tablet (2 mg total) by mouth daily. Dc rexulti not covered, Disp: 90 tablet, Rfl: 3   benzonatate (TESSALON) 100 MG capsule, Take 1 capsule (100 mg total) by mouth 3 (three) times daily as needed for cough., Disp: 30 capsule, Rfl: 0   BREZTRI AEROSPHERE 160-9-4.8 MCG/ACT AERO, INHALE 2 PUFFS INTO THE LUNGS IN THE MORNING AND AT BEDTIME.,  Disp: 10.7 each, Rfl: 5   Cholecalciferol 1.25 MG (50000 UT) capsule, Take 1 capsule (50,000 Units total) by mouth every 30 (thirty) days. 1x per month note this, Disp: 13 capsule, Rfl: 0   cyanocobalamin (VITAMIN B12) 1000 MCG/ML injection, INJECT 1 ML (1,000 MCG TOTAL) INTO THE MUSCLE EVERY 30 DAYS., Disp: 1 mL, Rfl: 15   cyclobenzaprine (FLEXERIL) 10 MG tablet, Take 1 tablet (10 mg total) by mouth 3 (three) times daily as needed for muscle spasms., Disp: 15 tablet, Rfl: 0   diclofenac Sodium (VOLTAREN) 1 % GEL, Apply 2-4 g topically 4 (four) times daily. 2 grams upper body qid prn and 4 gram lower body qid prn, Disp: 150 g, Rfl: 11   escitalopram (LEXAPRO) 20 MG tablet, TAKE 1 TABLET BY MOUTH EVERY DAY, Disp: 90 tablet, Rfl: 4   fluticasone (FLONASE) 50 MCG/ACT nasal spray, Place 2 sprays into both nostrils daily., Disp: 16 g, Rfl: 0   levocetirizine (XYZAL) 5 MG tablet, Take 1 tablet (5 mg total) by mouth every evening., Disp: 30 tablet, Rfl: 0   metoprolol succinate (TOPROL-XL) 100 MG 24 hr tablet, Take 1 tablet (100 mg total) by mouth daily. Take with or immediately following a meal., Disp: 30 tablet, Rfl: 2   naproxen (NAPROSYN) 500 MG tablet, Take 1 tablet (500 mg total) by mouth 2 (two) times daily with a meal., Disp: 30 tablet, Rfl: 0   nicotine (NICODERM CQ - DOSED IN MG/24 HOURS)  21 mg/24hr patch, Place 1 patch (21 mg total) onto the skin daily., Disp: 28 patch, Rfl: 0   nicotine (NICOTROL) 10 MG inhaler, Inhale 1 Cartridge (1 continuous puffing total) into the lungs as needed for smoking cessation (As needed for smoking cessation, No more than 16 cartridges per day)., Disp: 36 each, Rfl: 2   omeprazole (PRILOSEC) 20 MG capsule, TAKE 1 CAPSULE BY MOUTH EVERY DAY, Disp: 90 capsule, Rfl: 3   predniSONE (DELTASONE) 20 MG tablet, Take 2 tablets (40 mg total) by mouth daily with breakfast., Disp: 10 tablet, Rfl: 0   traZODone (DESYREL) 50 MG tablet, TAKE 0.5-1 TABLETS BY MOUTH AT BEDTIME AS  NEEDED FOR SLEEP., Disp: 90 tablet, Rfl: 3   Medications ordered in this encounter:  Meds ordered this encounter  Medications   doxycycline (VIBRA-TABS) 100 MG tablet    Sig: Take 1 tablet (100 mg total) by mouth 2 (two) times daily for 7 days.    Dispense:  14 tablet    Refill:  0     *If you need refills on other medications prior to your next appointment, please contact your pharmacy*  Follow-Up: Call back or seek an in-person evaluation if the symptoms worsen or if the condition fails to improve as anticipated.  [facility] [phone]  Other Instructions Chronic Obstructive Pulmonary Disease Exacerbation  Chronic obstructive pulmonary disease (COPD) is a long-term (chronic) lung problem. In COPD, the flow of air from the lungs is limited. COPD exacerbations are times that breathing gets worse and you need more than your normal treatment. Without treatment, they can be life-threatening. If they happen often, your lungs can become more damaged. What are the causes? Having infections that affect your airways and lungs. Being exposed to: Smoke. Air pollution. Chemical fumes. Dust. Things that can cause an allergic reaction (allergens). Not taking your usual COPD medicines as told. Having medical problems already, such as heart failure or infections not involving the lungs. In many cases, the cause is not known. What increases the risk? Smoking. Being an older adult. Having frequent prior COPD exacerbations. What are the signs or symptoms? Increased coughing. Increased mucus from your lungs. Increased wheezing. Increased shortness of breath. Fast breathing and finding it hard to breathe. Chest tightness. Less energy than usual. Sleep disruption from symptoms. Confusion. Increased sleepiness. Often, these symptoms happen or get worse even with the use of medicines. How is this treated? Treatment for this condition depends on how bad it is and the  cause of the symptoms. You may need to stay in the hospital for treatment. Treatment may include: Taking medicines. Using oxygen. Being treated with different ways to clear your airway, such as using a mask to deliver oxygen. Follow these instructions at home: Medicines Take over-the-counter and prescription medicines only as told by your doctor. Use all inhaled medicines the correct way. If you were prescribed an antibiotic or steroid medicine, take it as told by your doctor. Do not stop taking it even if you start to feel better. Lifestyle Do not smoke or use any products that contain nicotine or tobacco. If you need help quitting, ask your doctor. Eat healthy foods. Exercise regularly. Get enough sleep. Most adults need 7 or more hours per night. Avoid tobacco smoke and other things that can bother your lungs. Several times a day, wash your hands with soap and water for at least 20 seconds. If you cannot use soap and water, use hand sanitizer. This may help keep  you from getting an infection. During flu season, avoid areas that are crowded with people. General instructions Drink enough fluid to keep your pee (urine) pale yellow. Do not do this if your doctor has told you not to. Use a cool mist machine (vaporizer). If you use oxygen or a machine that turns medicine into a mist (nebulizer), continue to use it as told. Keep all follow-up visits. How is this prevented? Keep up with shots (vaccinations) as told by your doctor. Be sure to get a yearly flu (influenza) shot. If you smoke, quit smoking. Smoking makes the problem worse. Follow all instructions for rehabilitation. These are steps you can take to make your body work better. Work with your doctor to develop and follow an action plan. This tells you what steps to take when you experience certain symptoms. Contact a doctor if: Your COPD symptoms get worse than normal. Get help right away if: You are short of breath and it gets  worse, even when you are resting. You have trouble talking. You have chest pain. You cough up blood. You have a fever. You keep vomiting. You feel weak or you pass out (faint). You feel confused. You are not able to sleep because of your symptoms. You have trouble doing daily activities. These symptoms may be an emergency. Get help right away. Call your local emergency services (911 in the [place] Do not wait to see if the symptoms will go away. Do not drive yourself to the hospital. Summary COPD exacerbations are times that breathing gets worse and you need more treatment than normal. COPD exacerbations can be very serious and may cause your lungs to become more damaged. Do not smoke. If you need help quitting, ask your doctor. Stay up to date on your shots. Get a flu shot every year. This information is not intended to replace advice given to you by your health care provider. Make sure you discuss any questions you have with your health care provider. Document Revised: 05/25/2020 Document Reviewed: 05/10/2020 Elsevier Patient Education  2024 Elsevier Inc.    If you have been instructed to have an in-person evaluation today at a local Urgent Care facility, please use the link below. It will take you to a list of all of our available [facility], including address, phone number and hours of operation. Please do not delay care.  [facility]  If you or a family member do not have a primary care provider, use the link below to schedule a visit and establish care. When you choose a [facility] primary care physician or advanced practice provider, you gain a long-term partner in health. Find a Primary Care Provider  Learn more about [facility]'s in-office and virtual care options: [facility] - Get Care Now

## 2023-01-19 NOTE — Progress Notes (Signed)
Virtual Visit Consent   Carly [name], you are scheduled for a virtual visit with a [facility] provider today. Just as with appointments in the office, your consent must be obtained to participate. Your consent will be active for this visit and any virtual visit you may have with one of our providers in the next 365 days. If you have a MyChart account, a copy of this consent can be sent to you electronically.  As this is a virtual visit, video technology does not allow for your provider to perform a traditional examination. This may limit your provider's ability to fully assess your condition. If your provider identifies any concerns that need to be evaluated in person or the need to arrange testing (such as labs, EKG, etc.), we will make arrangements to do so. Although advances in technology are sophisticated, we cannot ensure that it will always work on either your end or our end. If the connection with a video visit is poor, the visit may have to be switched to a telephone visit. With either a video or telephone visit, we are not always able to ensure that we have a secure connection.  By engaging in this virtual visit, you consent to the provision of healthcare and authorize for your insurance to be billed (if applicable) for the services provided during this visit. Depending on your insurance coverage, you may receive a charge related to this service.  I need to obtain your verbal consent now. Are you willing to proceed with your visit today? Carly [name] has provided verbal consent on 01/19/2023 for a virtual visit (video or telephone). Reed Pandy, [place]  Date: 01/19/2023 12:09 PM  Virtual Visit via Video Note   I, Reed Pandy, [name]  ([id number], June 02, 1971) on 01/19/23 at 12:00 PM EDT by a video-enabled telemedicine application and verified that I am speaking with the correct person using two identifiers.  Location: Patient: Virtual Visit Location Patient:  Home Provider: Virtual Visit Location Provider: Home Office   I discussed the limitations of evaluation and management by telemedicine and the availability of in person appointments. The patient expressed understanding and agreed to proceed.    History of Present Illness: Carly [name] is a 53 y.o. who identifies as a female who was assigned female at birth, and is being seen today for c/o I was treated for COPD exacerbation and has lost her voice.  Pt states she was treated two weeks ago. Pt states she was given prednisone and Tessalon perles.  Pt states she is coughing quit a bit and doesn't know if she strained her vocal cords. Pt denies fever or chills.  Pt states sometimes she coughs up stuff and sometimes she doesn't. Pt states prednisone was starting to loosen up the phlegm but now its stuffy again.  Pt states she is also taking Mucinex DM.  Pt states she has wheezing real bad.   HPI: HPI  Problems:  Patient Active Problem List   Diagnosis Date Noted   Hypokalemia 09/09/2022   Hyponatremia 09/09/2022   COVID-19 09/09/2022   Tachycardia 09/09/2022   Pulmonary nodules 09/09/2022   Prolonged QT interval 09/09/2022   COPD exacerbation (HCC) 09/08/2022   Emphysema lung (HCC) 08/08/2022   Aortic atherosclerosis (HCC) 08/08/2022   Mood disorder (HCC) 08/08/2022   Colon cancer screening 08/08/2022   Breast cancer screening by mammogram 08/08/2022   Abnormal glucose 08/08/2022   Anxiety and depression 03/21/2022   Allergic rhinitis 11/24/2021  Lumbar radiculopathy 07/11/2021   Gastroesophageal reflux disease without esophagitis 07/01/2020   Hyperlipidemia 07/01/2020   Fatty liver 12/24/2019   Angiolipoma of left kidney 12/24/2019   Chronic ITP (idiopathic thrombocytopenic purpura) (HCC) 12/02/2019   Plantar fasciitis, bilateral 09/29/2019   Osteoarthritis of both knees 09/29/2019   Overactive bladder 07/14/2019   B12 deficiency 06/10/2019   Vitamin D deficiency 06/10/2019    Insomnia 08/15/2016   Thrombocytopenia (HCC) 02/12/2016   Carpal tunnel syndrome 01/20/2016   Morbid obesity (HCC) 01/04/2016   Essential hypertension 01/04/2016   Asthma-COPD overlap syndrome 01/13/2015   Female hirsutism 09/22/2014   Elevated testosterone level in female 09/22/2014   Tobacco use disorder 08/23/2014   Obesity (BMI 30-39.9) 08/23/2014   HTN (hypertension) 08/23/2014    Allergies:  Allergies  Allergen Reactions   Wellbutrin [Bupropion]     Crazy    Meloxicam Nausea Only   Medications:  Current Outpatient Medications:    doxycycline (VIBRA-TABS) 100 MG tablet, Take 1 tablet (100 mg total) by mouth 2 (two) times daily for 7 days., Disp: 14 tablet, Rfl: 0   acetaminophen (TYLENOL) 500 MG tablet, Take 500 mg by mouth every 6 (six) hours as needed., Disp: , Rfl:    albuterol (PROVENTIL) (2.5 MG/3ML) 0.083% nebulizer solution, Take 3 mLs (2.5 mg total) by nebulization every 6 (six) hours as needed for wheezing or shortness of breath., Disp: 360 mL, Rfl: 1   albuterol (VENTOLIN HFA) 108 (90 Base) MCG/ACT inhaler, INHALE 2 PUFFS INTO THE LUNGS EVERY 6 HOURS AS NEEDED FOR WHEEZING OR SHORTNESS OF BREATH (COUGH), Disp: 6.7 each, Rfl: 3   ALPRAZolam (XANAX) 0.25 MG tablet, Take 1 tablet (0.25 mg total) by mouth daily as needed for anxiety., Disp: 30 tablet, Rfl: 0   amLODipine (NORVASC) 10 MG tablet, Take 1 tablet (10 mg total) by mouth daily., Disp: 90 tablet, Rfl: 3   ARIPiprazole (ABILIFY) 2 MG tablet, Take 1 tablet (2 mg total) by mouth daily. Dc rexulti not covered, Disp: 90 tablet, Rfl: 3   benzonatate (TESSALON) 100 MG capsule, Take 1 capsule (100 mg total) by mouth 3 (three) times daily as needed for cough., Disp: 30 capsule, Rfl: 0   BREZTRI AEROSPHERE 160-9-4.8 MCG/ACT AERO, INHALE 2 PUFFS INTO THE LUNGS IN THE MORNING AND AT BEDTIME., Disp: 10.7 each, Rfl: 5   Cholecalciferol 1.25 MG (50000 UT) capsule, Take 1 capsule (50,000 Units total) by mouth every 30 (thirty)  days. 1x per month note this, Disp: 13 capsule, Rfl: 0   cyanocobalamin (VITAMIN B12) 1000 MCG/ML injection, INJECT 1 ML (1,000 MCG TOTAL) INTO THE MUSCLE EVERY 30 DAYS., Disp: 1 mL, Rfl: 15   cyclobenzaprine (FLEXERIL) 10 MG tablet, Take 1 tablet (10 mg total) by mouth 3 (three) times daily as needed for muscle spasms., Disp: 15 tablet, Rfl: 0   diclofenac Sodium (VOLTAREN) 1 % GEL, Apply 2-4 g topically 4 (four) times daily. 2 grams upper body qid prn and 4 gram lower body qid prn, Disp: 150 g, Rfl: 11   escitalopram (LEXAPRO) 20 MG tablet, TAKE 1 TABLET BY MOUTH EVERY DAY, Disp: 90 tablet, Rfl: 4   fluticasone (FLONASE) 50 MCG/ACT nasal spray, Place 2 sprays into both nostrils daily., Disp: 16 g, Rfl: 0   levocetirizine (XYZAL) 5 MG tablet, Take 1 tablet (5 mg total) by mouth every evening., Disp: 30 tablet, Rfl: 0   metoprolol succinate (TOPROL-XL) 100 MG 24 hr tablet, Take 1 tablet (100 mg total) by mouth daily. Take with  or immediately following a meal., Disp: 30 tablet, Rfl: 2   naproxen (NAPROSYN) 500 MG tablet, Take 1 tablet (500 mg total) by mouth 2 (two) times daily with a meal., Disp: 30 tablet, Rfl: 0   nicotine (NICODERM CQ - DOSED IN MG/24 HOURS) 21 mg/24hr patch, Place 1 patch (21 mg total) onto the skin daily., Disp: 28 patch, Rfl: 0   nicotine (NICOTROL) 10 MG inhaler, Inhale 1 Cartridge (1 continuous puffing total) into the lungs as needed for smoking cessation (As needed for smoking cessation, No more than 16 cartridges per day)., Disp: 36 each, Rfl: 2   omeprazole (PRILOSEC) 20 MG capsule, TAKE 1 CAPSULE BY MOUTH EVERY DAY, Disp: 90 capsule, Rfl: 3   predniSONE (DELTASONE) 20 MG tablet, Take 2 tablets (40 mg total) by mouth daily with breakfast., Disp: 10 tablet, Rfl: 0   traZODone (DESYREL) 50 MG tablet, TAKE 0.5-1 TABLETS BY MOUTH AT BEDTIME AS NEEDED FOR SLEEP., Disp: 90 tablet, Rfl: 3  Observations/Objective: Patient is well-developed, well-nourished in no acute distress.   Resting comfortably at home.  Head is normocephalic, atraumatic.  No labored breathing. Speech is clear and coherent with logical content.  Patient is alert and oriented at baseline.    Assessment and Plan: 1. COPD exacerbation (HCC) - doxycycline (VIBRA-TABS) 100 MG tablet; Take 1 tablet (100 mg total) by mouth 2 (two) times daily for 7 days.  Dispense: 14 tablet; Refill: 0  -Pt recently completed Steroid regimen  -Pt to start Doxycycline and continue Mucinex as recommended -Advised Pt if symptoms persist or worsen in the next day or two to head to the emergency room for further evaluation  -Pt verbalized understanding  Follow Up Instructions: I discussed the assessment and treatment plan with the patient. The patient was provided an opportunity to ask questions and all were answered. The patient agreed with the plan and demonstrated an understanding of the instructions.  A copy of instructions were sent to the patient via MyChart unless otherwise noted below.     The patient was advised to call back or seek an in-person evaluation if the symptoms worsen or if the condition fails to improve as anticipated.  Time:  I spent 15 minutes with the patient via telehealth technology discussing the above problems/concerns.    Reed Pandy, PA-C

## 2023-01-29 ENCOUNTER — Ambulatory Visit: Payer: BC Managed Care – PPO | Admitting: Family Medicine

## 2023-01-31 ENCOUNTER — Ambulatory Visit: Payer: BC Managed Care – PPO | Attending: Pulmonary Disease

## 2023-02-06 ENCOUNTER — Inpatient Hospital Stay: Payer: BC Managed Care – PPO | Admitting: Oncology

## 2023-02-06 ENCOUNTER — Inpatient Hospital Stay: Payer: BC Managed Care – PPO

## 2023-02-08 ENCOUNTER — Telehealth: Payer: BC Managed Care – PPO | Admitting: Family Medicine

## 2023-02-08 DIAGNOSIS — J4521 Mild intermittent asthma with (acute) exacerbation: Secondary | ICD-10-CM

## 2023-02-08 MED ORDER — PREDNISONE 20 MG PO TABS
40.0000 mg | ORAL_TABLET | Freq: Every day | ORAL | 0 refills | Status: DC
Start: 2023-02-08 — End: 2023-02-14

## 2023-02-08 MED ORDER — BENZONATATE 200 MG PO CAPS: 200.0000 mg | ORAL_CAPSULE | Freq: Two times a day (BID) | ORAL | 0 refills | Status: DC | PRN

## 2023-02-08 NOTE — Progress Notes (Signed)
E-Visit for Cough  We are sorry that you are not feeling well.  Here is how we plan to help!  Based on your presentation I believe you most likely have A cough due to a virus.  This is called viral bronchitis and is best treated by rest, plenty of fluids and control of the cough.  You may use Ibuprofen or Tylenol as directed to help your symptoms.     In addition you may use A prescription cough medication called Tessalon Perles 100mg . You may take 1-2 capsules every 8 hours as needed for your cough.   Directions for 6 day taper: Day 1: 2 tablets before breakfast, 1 after both lunch & dinner and 2 at bedtime Day 2: 1 tab before breakfast, 1 after both lunch & dinner and 2 at bedtime Day 3: 1 tab at each meal & 1 at bedtime Day 4: 1 tab at breakfast, 1 at lunch, 1 at bedtime Day 5: 1 tab at breakfast & 1 tab at bedtime Day 6: 1 tab at breakfast  From your responses in the eVisit questionnaire you describe inflammation in the upper respiratory tract which is causing a significant cough.  This is commonly called Bronchitis and has four common causes:   Allergies Viral Infections Acid Reflux Bacterial Infection Allergies, viruses and acid reflux are treated by controlling symptoms or eliminating the cause. An example might be a cough caused by taking certain blood pressure medications. You stop the cough by changing the medication. Another example might be a cough caused by acid reflux. Controlling the reflux helps control the cough.  USE OF BRONCHODILATOR ("RESCUE") INHALERS: There is a risk from using your bronchodilator too frequently.  The risk is that over-reliance on a medication which only relaxes the muscles surrounding the breathing tubes can reduce the effectiveness of medications prescribed to reduce swelling and congestion of the tubes themselves.  Although you feel brief relief from the bronchodilator inhaler, your asthma may actually be worsening with the tubes becoming more  swollen and filled with mucus.  This can delay other crucial treatments, such as oral steroid medications. If you need to use a bronchodilator inhaler daily, several times per day, you should discuss this with your provider.  There are probably better treatments that could be used to keep your asthma under control.     HOME CARE Only take medications as instructed by your medical team. Complete the entire course of an antibiotic. Drink plenty of fluids and get plenty of rest. Avoid close contacts especially the very young and the elderly Cover your mouth if you cough or cough into your sleeve. Always remember to wash your hands A steam or ultrasonic humidifier can help congestion.   GET HELP RIGHT AWAY IF: You develop worsening fever. You become short of breath You cough up blood. Your symptoms persist after you have completed your treatment plan MAKE SURE YOU  Understand these instructions. Will watch your condition. Will get help right away if you are not doing well or get worse.    Thank you for choosing an e-visit.  Your e-visit answers were reviewed by a board certified advanced clinical practitioner to complete your personal care plan. Depending upon the condition, your plan could have included both over the counter or prescription medications.  Please review your pharmacy choice. Make sure the pharmacy is open so you can pick up prescription now. If there is a problem, you may contact your provider through [employer] and have the prescription  routed to another pharmacy.  Your safety is important to [place]. If you have drug allergies check your prescription carefully.   For the next 24 hours you can use MyChart to ask questions about today's visit, request a non-urgent call back, or ask for a work or school excuse. You will get an email in the next two days asking about your experience. I hope that your e-visit has been valuable and will speed your recovery.    have provided 5  minutes of non face to face time during this encounter for chart review and documentation.

## 2023-02-11 ENCOUNTER — Ambulatory Visit (INDEPENDENT_AMBULATORY_CARE_PROVIDER_SITE_OTHER): Payer: BC Managed Care – PPO | Admitting: Family Medicine

## 2023-02-11 ENCOUNTER — Encounter: Payer: Self-pay | Admitting: Family Medicine

## 2023-02-11 VITALS — BP 132/74 | HR 85 | Temp 97.8°F | Resp 16 | Ht 62.0 in | Wt 213.5 lb

## 2023-02-11 DIAGNOSIS — E785 Hyperlipidemia, unspecified: Secondary | ICD-10-CM

## 2023-02-11 DIAGNOSIS — I10 Essential (primary) hypertension: Secondary | ICD-10-CM

## 2023-02-11 DIAGNOSIS — J432 Centrilobular emphysema: Secondary | ICD-10-CM

## 2023-02-11 DIAGNOSIS — I700 Atherosclerosis of aorta: Secondary | ICD-10-CM

## 2023-02-11 DIAGNOSIS — E559 Vitamin D deficiency, unspecified: Secondary | ICD-10-CM

## 2023-02-11 DIAGNOSIS — F39 Unspecified mood [affective] disorder: Secondary | ICD-10-CM

## 2023-02-11 DIAGNOSIS — F32A Depression, unspecified: Secondary | ICD-10-CM

## 2023-02-11 DIAGNOSIS — D693 Immune thrombocytopenic purpura: Secondary | ICD-10-CM

## 2023-02-11 DIAGNOSIS — K219 Gastro-esophageal reflux disease without esophagitis: Secondary | ICD-10-CM

## 2023-02-11 DIAGNOSIS — F419 Anxiety disorder, unspecified: Secondary | ICD-10-CM

## 2023-02-11 DIAGNOSIS — G4700 Insomnia, unspecified: Secondary | ICD-10-CM

## 2023-02-11 DIAGNOSIS — F339 Major depressive disorder, recurrent, unspecified: Secondary | ICD-10-CM

## 2023-02-11 DIAGNOSIS — F330 Major depressive disorder, recurrent, mild: Secondary | ICD-10-CM

## 2023-02-11 MED ORDER — CHOLECALCIFEROL 1.25 MG (50000 UT) PO CAPS: 50000.0000 [IU] | ORAL_CAPSULE | ORAL | 0 refills | Status: DC

## 2023-02-11 MED ORDER — OMEPRAZOLE 20 MG PO CPDR: 20.0000 mg | DELAYED_RELEASE_CAPSULE | Freq: Every day | ORAL | 1 refills | Status: DC

## 2023-02-11 MED ORDER — AMLODIPINE BESYLATE 10 MG PO TABS: 10.0000 mg | ORAL_TABLET | Freq: Every day | ORAL | 3 refills | Status: DC

## 2023-02-11 MED ORDER — METOPROLOL SUCCINATE ER 100 MG PO TB24: 100.0000 mg | ORAL_TABLET | Freq: Every day | ORAL | 2 refills | Status: DC

## 2023-02-11 NOTE — Patient Instructions (Addendum)
It was a pleasure meeting you today. Thank you for allowing me to take part in your health care.  Our goals for today as we discussed include:  Continue to follow up with Psychiatry for management.  Refills sent for requested medication  Schedule annual physical  PAP due Cologuard due Shingles vaccine due Mammogram due  If you have any questions or concerns, please do not hesitate to call the office at [phone].  I look forward to our next visit and until then take care and stay safe.  Regards,   Dana Allan, MD   [facility]

## 2023-02-11 NOTE — Progress Notes (Signed)
SUBJECTIVE:   Chief Complaint  Patient presents with   Shortness of Breath   Wheezing   HPI Patient presents to clinic for follow up   Requesting proscription for nebulizer machine  Chronic ITP Doing well.  Platelets now back to normal. Follows with oncology.  Receiving Nplate infusions.  Hypertension Asymptomatic.  Currently takes Norvasc 10 mg daily,Metoprolol XR 100 mg.  Requests refill for medications  Mood disorder Doing much better.  Reports now followed by online psychiatry and has had some adjustments in medications. Xanax was discontinued.  Abilify increased from 2 mg to 4 mg daily, Lamotrigine started and now at 100 mg at night. She continues to take Lexapro 20 mg daily and trazodone 50 mg at night.  Reports plan for decreasing trazodone once Lamictal dose has increased.  She currently follows with  Dr Eulah Pont, [facility]  Asthma/COPD Recently treated for exacerbation.  Currently on Prednisone 40 mg daily x 6 days.  Requesting prescription for nebulizer machine as hers has now broken.  Denies any worsening SOB, chest pain, fevers.   PERTINENT PMH / PSH: Hypertension Mood disorder Chronic ITP  OBJECTIVE:  BP 132/74   Pulse 85   Temp 97.8 F (36.6 C)   Resp 16   Ht 5\' 2"  (1.575 m)   Wt 213 lb 8 oz (96.8 kg)   SpO2 95%   BMI 39.05 kg/m    Physical Exam Vitals reviewed.  Constitutional:      General: She is not in acute distress.    Appearance: She is not ill-appearing.  HENT:     Head: Normocephalic.     Nose: Nose normal.  Eyes:     Conjunctiva/sclera: Conjunctivae normal.  Cardiovascular:     Rate and Rhythm: Normal rate and regular rhythm.     Heart sounds: Normal heart sounds.  Pulmonary:     Effort: Pulmonary effort is normal.     Breath sounds: Normal breath sounds.  Abdominal:     General: Abdomen is flat. Bowel sounds are normal.     Palpations: Abdomen is soft.  Musculoskeletal:        General: Normal range of motion.     Cervical  back: Normal range of motion.  Neurological:     Mental Status: She is alert and oriented to person, place, and time. Mental status is at baseline.  Psychiatric:        Mood and Affect: Mood normal.        Behavior: Behavior normal.        Thought Content: Thought content normal.        Judgment: Judgment normal.     ASSESSMENT/PLAN:  Centrilobular emphysema (HCC) Assessment & Plan: Chronic.  Stable.  Noted on CT chest 05/2021. Prescription provided for new nebulizer machine.  Given to patient Continue to follow with pulmonology.  Orders: -     For home use only DME Nebulizer machine  Aortic atherosclerosis (HCC) Assessment & Plan: Chronic.  Stable.  Noted on CT chest 05/2021. The 10-year ASCVD risk score (Arnett DK, et al., 2019) is: 5.3%  Not interested in statin therapy. Recommend smoking cessation Recommend healthy nutrition and increase in activity    Mood disorder [facility]) Assessment & Plan: Chronic.  Improving since adjustment in medication.  Denies any SI/HI.  Has decided to seek therapy with online psychiatry and adjustments have been made in medications Discontinued Xanax 0.25 mg at night Increased Abilify to 4 mg daily Continue Lexapro 20 mg daily Initiated Lamotrigine  and continuing to titrate dose.  Now taking 100 mg daily Continue to follow with Dr Rolm Bookbinder, [facility] online    Morbid obesity [facility] Assessment & Plan: Encouraged lifestyle changes   Chronic ITP (idiopathic thrombocytopenic purpura) (HCC) Assessment & Plan: Chronic.  Stable. Follows with oncology   Essential hypertension -     amLODIPine Besylate; Take 1 tablet (10 mg total) by mouth daily.  Dispense: 90 tablet; Refill: 3 -     Metoprolol Succinate ER; Take 1 tablet (100 mg total) by mouth daily. Take with or immediately following a meal.  Dispense: 30 tablet; Refill: 2  Vitamin D deficiency Assessment & Plan: Refill Vitamin D 1.25 mg weekly  Orders: -     Cholecalciferol;  Take 1 capsule (50,000 Units total) by mouth every 30 (thirty) days. 1x per month note this  Dispense: 13 capsule; Refill: 0  Gastroesophageal reflux disease without esophagitis Assessment & Plan: Chronic Refill Omeprazole 20 mg daily  Orders: -     Omeprazole; Take 1 capsule (20 mg total) by mouth daily.  Dispense: 90 capsule; Refill: 1  Primary hypertension Assessment & Plan: Chronic.  Asymptomatic.  Blood pressure at home 120-130/60-80.  Currently on Prednisone for COPD exacerbation. Refill Metoprolol XL 100 mg daily Refill amlodipine 10 mg daily     PDMP reviewed  Return if symptoms worsen or fail to improve, for PCP.  Dana Allan, MD

## 2023-02-12 ENCOUNTER — Telehealth: Payer: BC Managed Care – PPO | Admitting: Family Medicine

## 2023-02-12 DIAGNOSIS — R300 Dysuria: Secondary | ICD-10-CM

## 2023-02-12 NOTE — Progress Notes (Signed)
Because given kidney infection history in addition to both back and belly pain, you need to be seen in person ,your condition warrants further evaluation and I recommend that you be seen in a face to face visit.   NOTE: There will be NO CHARGE for this eVisit

## 2023-02-14 ENCOUNTER — Telehealth: Payer: BC Managed Care – PPO | Admitting: Physician Assistant

## 2023-02-14 DIAGNOSIS — J441 Chronic obstructive pulmonary disease with (acute) exacerbation: Secondary | ICD-10-CM | POA: Diagnosis not present

## 2023-02-14 MED ORDER — PREDNISONE 20 MG PO TABS
40.0000 mg | ORAL_TABLET | Freq: Every day | ORAL | 0 refills | Status: DC
Start: 2023-02-14 — End: 2023-03-07

## 2023-02-14 MED ORDER — DOXYCYCLINE HYCLATE 100 MG PO TABS
100.0000 mg | ORAL_TABLET | Freq: Two times a day (BID) | ORAL | 0 refills | Status: DC
Start: 2023-02-14 — End: 2023-03-07

## 2023-02-14 NOTE — Progress Notes (Signed)
Virtual Visit Consent   Carly [name], you are scheduled for a virtual visit with a [place] provider today. Just as with appointments in the office, your consent must be obtained to participate. Your consent will be active for this visit and any virtual visit you may have with one of our providers in the next 365 days. If you have a MyChart account, a copy of this consent can be sent to you electronically.  As this is a virtual visit, video technology does not allow for your provider to perform a traditional examination. This may limit your provider's ability to fully assess your condition. If your provider identifies any concerns that need to be evaluated in person or the need to arrange testing (such as labs, EKG, etc.), we will make arrangements to do so. Although advances in technology are sophisticated, we cannot ensure that it will always work on either your end or our end. If the connection with a video visit is poor, the visit may have to be switched to a telephone visit. With either a video or telephone visit, we are not always able to ensure that we have a secure connection.  By engaging in this virtual visit, you consent to the provision of healthcare and authorize for your insurance to be billed (if applicable) for the services provided during this visit. Depending on your insurance coverage, you may receive a charge related to this service.  I need to obtain your verbal consent now. Are you willing to proceed with your visit today? Carly [name] has provided verbal consent on 02/14/2023 for a virtual visit (video or telephone). Carly [name], [place]  Date: 02/14/2023 7:55 AM  Virtual Visit via Video Note   I, Carly [name], [name]  ([id number], 02-24-1971) on 02/14/23 at  7:45 AM EDT by a video-enabled telemedicine application and verified that I am speaking with the correct person using two identifiers.  Location: Patient: Virtual Visit Location  Patient: Home Provider: Virtual Visit Location Provider: Home Office   I discussed the limitations of evaluation and management by telemedicine and the availability of in person appointments. The patient expressed understanding and agreed to proceed.    History of Present Illness: Carly [name] is a 52 y.o. who identifies as a female who was assigned female at birth, and is being seen today for COPD exacerbation. Patient endorses over a week of increased congestion, now with change in sputum coloration and thickness -- clear to white to dark yellow. Denies fever, chills. Notes fatigue. Notes both chest tightness and SOB with exertion despite her regular inhaled medications. Was seen via e-visit last week and put on a short taper of steroid, noting initial improvement the first 2 days and then no further improvement. Now also with maxillary sinus pressure and sinus pain. Denies LH/dizziness. Denies recent travel or sick contact.  HPI: HPI  Problems:  Patient Active Problem List   Diagnosis Date Noted   Hypokalemia 09/09/2022   Hyponatremia 09/09/2022   COVID-19 09/09/2022   Tachycardia 09/09/2022   Pulmonary nodules 09/09/2022   Prolonged QT interval 09/09/2022   COPD exacerbation (HCC) 09/08/2022   Emphysema lung (HCC) 08/08/2022   Aortic atherosclerosis (HCC) 08/08/2022   Mood disorder (HCC) 08/08/2022   Colon cancer screening 08/08/2022   Breast cancer screening by mammogram 08/08/2022   Abnormal glucose 08/08/2022   Anxiety and depression 03/21/2022   Allergic rhinitis 11/24/2021   Lumbar radiculopathy 07/11/2021   Gastroesophageal reflux disease  without esophagitis 07/01/2020   Hyperlipidemia 07/01/2020   Fatty liver 12/24/2019   Angiolipoma of left kidney 12/24/2019   Chronic ITP (idiopathic thrombocytopenic purpura) (HCC) 12/02/2019   Plantar fasciitis, bilateral 09/29/2019   Osteoarthritis of both knees 09/29/2019   Overactive bladder 07/14/2019   B12 deficiency 06/10/2019    Vitamin D deficiency 06/10/2019   Insomnia 08/15/2016   Thrombocytopenia (HCC) 02/12/2016   Carpal tunnel syndrome 01/20/2016   Morbid obesity (HCC) 01/04/2016   Essential hypertension 01/04/2016   Asthma-COPD overlap syndrome 01/13/2015   Female hirsutism 09/22/2014   Elevated testosterone level in female 09/22/2014   Tobacco use disorder 08/23/2014   Obesity (BMI 30-39.9) 08/23/2014   HTN (hypertension) 08/23/2014    Allergies:  Allergies  Allergen Reactions   Wellbutrin [Bupropion]     Crazy    Meloxicam Nausea Only   Medications:  Current Outpatient Medications:    doxycycline (VIBRA-TABS) 100 MG tablet, Take 1 tablet (100 mg total) by mouth 2 (two) times daily., Disp: 14 tablet, Rfl: 0   predniSONE (DELTASONE) 20 MG tablet, Take 2 tablets (40 mg total) by mouth daily with breakfast., Disp: 10 tablet, Rfl: 0   acetaminophen (TYLENOL) 500 MG tablet, Take 500 mg by mouth every 6 (six) hours as needed., Disp: , Rfl:    albuterol (PROVENTIL) (2.5 MG/3ML) 0.083% nebulizer solution, Take 3 mLs (2.5 mg total) by nebulization every 6 (six) hours as needed for wheezing or shortness of breath., Disp: 360 mL, Rfl: 1   albuterol (VENTOLIN HFA) 108 (90 Base) MCG/ACT inhaler, INHALE 2 PUFFS INTO THE LUNGS EVERY 6 HOURS AS NEEDED FOR WHEEZING OR SHORTNESS OF BREATH (COUGH), Disp: 6.7 each, Rfl: 3   amLODipine (NORVASC) 10 MG tablet, Take 1 tablet (10 mg total) by mouth daily., Disp: 90 tablet, Rfl: 3   ARIPiprazole (ABILIFY) 2 MG tablet, Take 1 tablet (2 mg total) by mouth daily. Dc rexulti not covered, Disp: 90 tablet, Rfl: 3   benzonatate (TESSALON) 200 MG capsule, Take 1 capsule (200 mg total) by mouth 2 (two) times daily as needed for cough., Disp: 20 capsule, Rfl: 0   BREZTRI AEROSPHERE 160-9-4.8 MCG/ACT AERO, INHALE 2 PUFFS INTO THE LUNGS IN THE MORNING AND AT BEDTIME., Disp: 10.7 each, Rfl: 5   Cholecalciferol 1.25 MG (50000 UT) capsule, Take 1 capsule (50,000 Units total) by mouth  every 30 (thirty) days. 1x per month note this, Disp: 13 capsule, Rfl: 0   cyanocobalamin (VITAMIN B12) 1000 MCG/ML injection, INJECT 1 ML (1,000 MCG TOTAL) INTO THE MUSCLE EVERY 30 DAYS., Disp: 1 mL, Rfl: 15   cyclobenzaprine (FLEXERIL) 10 MG tablet, Take 1 tablet (10 mg total) by mouth 3 (three) times daily as needed for muscle spasms., Disp: 15 tablet, Rfl: 0   diclofenac Sodium (VOLTAREN) 1 % GEL, Apply 2-4 g topically 4 (four) times daily. 2 grams upper body qid prn and 4 gram lower body qid prn, Disp: 150 g, Rfl: 11   escitalopram (LEXAPRO) 20 MG tablet, TAKE 1 TABLET BY MOUTH EVERY DAY, Disp: 90 tablet, Rfl: 4   fluticasone (FLONASE) 50 MCG/ACT nasal spray, Place 2 sprays into both nostrils daily., Disp: 16 g, Rfl: 0   lamoTRIgine (LAMICTAL) 100 MG tablet, Take 100 mg by mouth daily., Disp: , Rfl:    levocetirizine (XYZAL) 5 MG tablet, Take 1 tablet (5 mg total) by mouth every evening., Disp: 30 tablet, Rfl: 0   metoprolol succinate (TOPROL-XL) 100 MG 24 hr tablet, Take 1 tablet (100 mg total) by  mouth daily. Take with or immediately following a meal., Disp: 30 tablet, Rfl: 2   naproxen (NAPROSYN) 500 MG tablet, Take 1 tablet (500 mg total) by mouth 2 (two) times daily with a meal., Disp: 30 tablet, Rfl: 0   omeprazole (PRILOSEC) 20 MG capsule, Take 1 capsule (20 mg total) by mouth daily., Disp: 90 capsule, Rfl: 1   traZODone (DESYREL) 50 MG tablet, TAKE 0.5-1 TABLETS BY MOUTH AT BEDTIME AS NEEDED FOR SLEEP., Disp: 90 tablet, Rfl: 3  Observations/Objective: Patient is well-developed, well-nourished in no acute distress.  Resting comfortably at home.  Head is normocephalic, atraumatic.  No labored breathing. Speech is clear and coherent with logical content.  Patient is alert and oriented at baseline. .  Assessment and Plan: 1. COPD exacerbation (HCC) - predniSONE (DELTASONE) 20 MG tablet; Take 2 tablets (40 mg total) by mouth daily with breakfast.  Dispense: 10 tablet; Refill: 0 -  doxycycline (VIBRA-TABS) 100 MG tablet; Take 1 tablet (100 mg total) by mouth 2 (two) times daily.  Dispense: 14 tablet; Refill: 0  Supportive measures and OTC medications reviewed. Will give burst of prednisone for 5 days at 40 mg along with Doxycycline BID x 7 days. Continue COPD medications. Continue tessalon for cough, adding on Mucinex to thin congestion. Strict in-person evaluation precautions reviewed.   Follow Up Instructions: I discussed the assessment and treatment plan with the patient. The patient was provided an opportunity to ask questions and all were answered. The patient agreed with the plan and demonstrated an understanding of the instructions.  A copy of instructions were sent to the patient via MyChart unless otherwise noted below.   The patient was advised to call back or seek an in-person evaluation if the symptoms worsen or if the condition fails to improve as anticipated.  Time:  I spent 10 minutes with the patient via telehealth technology discussing the above problems/concerns.    Carly Climes, PA-C

## 2023-02-14 NOTE — Patient Instructions (Signed)
[name], thank you for joining Piedad Climes, PA-C for today's virtual visit.  While this provider is not your primary care provider (PCP), if your PCP is located in our provider database this encounter information will be shared with them immediately following your visit.   A [facility] MyChart account gives you access to today's visit and all your visits, tests, and labs performed at [facility] " click here if you don't have a [facility] MyChart account or go to [email or web address]  Consent: (Patient) Carly [name] provided verbal consent for this virtual visit at the beginning of the encounter.  Current Medications:  Current Outpatient Medications:    acetaminophen (TYLENOL) 500 MG tablet, Take 500 mg by mouth every 6 (six) hours as needed., Disp: , Rfl:    albuterol (PROVENTIL) (2.5 MG/3ML) 0.083% nebulizer solution, Take 3 mLs (2.5 mg total) by nebulization every 6 (six) hours as needed for wheezing or shortness of breath., Disp: 360 mL, Rfl: 1   albuterol (VENTOLIN HFA) 108 (90 Base) MCG/ACT inhaler, INHALE 2 PUFFS INTO THE LUNGS EVERY 6 HOURS AS NEEDED FOR WHEEZING OR SHORTNESS OF BREATH (COUGH), Disp: 6.7 each, Rfl: 3   amLODipine (NORVASC) 10 MG tablet, Take 1 tablet (10 mg total) by mouth daily., Disp: 90 tablet, Rfl: 3   ARIPiprazole (ABILIFY) 2 MG tablet, Take 1 tablet (2 mg total) by mouth daily. Dc rexulti not covered, Disp: 90 tablet, Rfl: 3   benzonatate (TESSALON) 200 MG capsule, Take 1 capsule (200 mg total) by mouth 2 (two) times daily as needed for cough., Disp: 20 capsule, Rfl: 0   BREZTRI AEROSPHERE 160-9-4.8 MCG/ACT AERO, INHALE 2 PUFFS INTO THE LUNGS IN THE MORNING AND AT BEDTIME., Disp: 10.7 each, Rfl: 5   Cholecalciferol 1.25 MG (50000 UT) capsule, Take 1 capsule (50,000 Units total) by mouth every 30 (thirty) days. 1x per month note this, Disp: 13 capsule, Rfl: 0   cyanocobalamin (VITAMIN B12) 1000 MCG/ML injection, INJECT 1 ML (1,000  MCG TOTAL) INTO THE MUSCLE EVERY 30 DAYS., Disp: 1 mL, Rfl: 15   cyclobenzaprine (FLEXERIL) 10 MG tablet, Take 1 tablet (10 mg total) by mouth 3 (three) times daily as needed for muscle spasms., Disp: 15 tablet, Rfl: 0   diclofenac Sodium (VOLTAREN) 1 % GEL, Apply 2-4 g topically 4 (four) times daily. 2 grams upper body qid prn and 4 gram lower body qid prn, Disp: 150 g, Rfl: 11   escitalopram (LEXAPRO) 20 MG tablet, TAKE 1 TABLET BY MOUTH EVERY DAY, Disp: 90 tablet, Rfl: 4   fluticasone (FLONASE) 50 MCG/ACT nasal spray, Place 2 sprays into both nostrils daily., Disp: 16 g, Rfl: 0   lamoTRIgine (LAMICTAL) 100 MG tablet, Take 100 mg by mouth daily., Disp: , Rfl:    levocetirizine (XYZAL) 5 MG tablet, Take 1 tablet (5 mg total) by mouth every evening., Disp: 30 tablet, Rfl: 0   metoprolol succinate (TOPROL-XL) 100 MG 24 hr tablet, Take 1 tablet (100 mg total) by mouth daily. Take with or immediately following a meal., Disp: 30 tablet, Rfl: 2   naproxen (NAPROSYN) 500 MG tablet, Take 1 tablet (500 mg total) by mouth 2 (two) times daily with a meal., Disp: 30 tablet, Rfl: 0   omeprazole (PRILOSEC) 20 MG capsule, Take 1 capsule (20 mg total) by mouth daily., Disp: 90 capsule, Rfl: 1   predniSONE (DELTASONE) 20 MG tablet, Take 2 tablets (40 mg total) by mouth daily with breakfast., Disp: 10 tablet, Rfl: 0  traZODone (DESYREL) 50 MG tablet, TAKE 0.5-1 TABLETS BY MOUTH AT BEDTIME AS NEEDED FOR SLEEP., Disp: 90 tablet, Rfl: 3   Medications ordered in this encounter:  No orders of the defined types were placed in this encounter.    *If you need refills on other medications prior to your next appointment, please contact your pharmacy*  Follow-Up: Call back or seek an in-person evaluation if the symptoms worsen or if the condition fails to improve as anticipated.  [facility] [phone]  Other Instructions Please keep hydrated and rest. Start Mucinex OTC to thin congestion. Continue  your regular medications as well as the Tessalon for cough. Take the prednisone and doxycycline as directed. If no improvement over next 48 hours or any new/worsening symptoms despite treatment, you need to seek an in-person evaluation ASAP. DO NOT DELAY CARE.   If you have been instructed to have an in-person evaluation today at a local Urgent Care facility, please use the link below. It will take you to a list of all of our available [facility], including address, phone number and hours of operation. Please do not delay care.  [facility]  If you or a family member do not have a primary care provider, use the link below to schedule a visit and establish care. When you choose a [facility] primary care physician or advanced practice provider, you gain a long-term partner in health. Find a Primary Care Provider  Learn more about [facility] in-office and virtual care options:  - Get Care Now

## 2023-02-23 ENCOUNTER — Encounter: Payer: Self-pay | Admitting: Family Medicine

## 2023-02-23 MED ORDER — TRAZODONE HCL 50 MG PO TABS
ORAL_TABLET | ORAL | Status: DC
Start: 2023-02-23 — End: 2024-01-15

## 2023-02-23 MED ORDER — ESCITALOPRAM OXALATE 20 MG PO TABS: 20.0000 mg | ORAL_TABLET | Freq: Every day | ORAL | Status: DC

## 2023-02-23 MED ORDER — ARIPIPRAZOLE 2 MG PO TABS
4.0000 mg | ORAL_TABLET | Freq: Every day | ORAL | Status: DC
Start: 2023-02-23 — End: 2023-11-24

## 2023-02-23 NOTE — Assessment & Plan Note (Addendum)
Chronic.  Asymptomatic.  Blood pressure at home 120-130/60-80.  Currently on Prednisone for COPD exacerbation. Refill Metoprolol XL 100 mg daily Refill amlodipine 10 mg daily

## 2023-02-23 NOTE — Assessment & Plan Note (Signed)
Chronic.  Stable.  Noted on CT chest 05/2021. Prescription provided for new nebulizer machine.  Given to patient Continue to follow with pulmonology.

## 2023-02-23 NOTE — Assessment & Plan Note (Signed)
Encouraged lifestyle changes.

## 2023-02-23 NOTE — Addendum Note (Signed)
Addended by: Enid Cutter on: 02/23/2023 06:41 PM   Modules accepted: Orders

## 2023-02-23 NOTE — Assessment & Plan Note (Signed)
Chronic.  Stable.   Follows with oncology.

## 2023-02-23 NOTE — Assessment & Plan Note (Signed)
Refill Vitamin D 1.25 mg weekly

## 2023-02-23 NOTE — Assessment & Plan Note (Signed)
Chronic.  Stable.  Noted on CT chest 05/2021. The 10-year ASCVD risk score (Arnett DK, et al., 2019) is: 5.3%  Not interested in statin therapy. Recommend smoking cessation Recommend healthy nutrition and increase in activity

## 2023-02-23 NOTE — Assessment & Plan Note (Signed)
Chronic.   Refill Omeprazole 20 mg daily

## 2023-02-23 NOTE — Assessment & Plan Note (Signed)
Chronic.  Improving since adjustment in medication.  Denies any SI/HI.  Has decided to seek therapy with online psychiatry and adjustments have been made in medications Discontinued Xanax 0.25 mg at night Increased Abilify to 4 mg daily Continue Lexapro 20 mg daily Initiated Lamotrigine and continuing to titrate dose.  Now taking 100 mg daily Continue to follow with Dr Rolm Bookbinder, [facility] online

## 2023-02-26 ENCOUNTER — Telehealth: Payer: Self-pay | Admitting: Family Medicine

## 2023-02-26 NOTE — Telephone Encounter (Signed)
Prescription Request  02/26/2023  LOV: 02/11/2023  What is the name of the medication or equipment? BREZTRI AEROSPHERE 160-9-4.8 MCG/ACT AERO  Have you contacted your pharmacy to request a refill? Yes   Which pharmacy would you like this sent to?   [id number] [place], [place] - [address] AT [facility] [address] [place] [address] Phone: [phone] Fax: [phone]   Patient notified that their request is being sent to the clinical staff for review and that they should receive a response within 2 business days.   Please advise at Mobile [phone] (mobile)

## 2023-02-26 NOTE — Telephone Encounter (Signed)
Called pt and informed her to reach out to pulmonary to get the medication refilled.

## 2023-02-28 ENCOUNTER — Inpatient Hospital Stay: Payer: BC Managed Care – PPO

## 2023-02-28 ENCOUNTER — Inpatient Hospital Stay: Payer: BC Managed Care – PPO | Admitting: Oncology

## 2023-02-28 ENCOUNTER — Encounter (INDEPENDENT_AMBULATORY_CARE_PROVIDER_SITE_OTHER): Payer: Self-pay

## 2023-03-07 ENCOUNTER — Encounter: Payer: Self-pay | Admitting: Oncology

## 2023-03-07 ENCOUNTER — Inpatient Hospital Stay: Payer: BC Managed Care – PPO | Attending: Oncology

## 2023-03-07 ENCOUNTER — Inpatient Hospital Stay: Payer: BC Managed Care – PPO

## 2023-03-07 ENCOUNTER — Other Ambulatory Visit: Payer: Self-pay

## 2023-03-07 ENCOUNTER — Inpatient Hospital Stay (HOSPITAL_BASED_OUTPATIENT_CLINIC_OR_DEPARTMENT_OTHER): Payer: BC Managed Care – PPO | Admitting: Oncology

## 2023-03-07 VITALS — BP 134/69 | HR 78 | Temp 97.4°F | Resp 22 | Ht 62.0 in | Wt 213.0 lb

## 2023-03-07 DIAGNOSIS — D693 Immune thrombocytopenic purpura: Secondary | ICD-10-CM

## 2023-03-07 DIAGNOSIS — F17210 Nicotine dependence, cigarettes, uncomplicated: Secondary | ICD-10-CM | POA: Insufficient documentation

## 2023-03-07 LAB — CBC WITH DIFFERENTIAL/PLATELET
Abs Immature Granulocytes: 0.03 10*3/uL (ref 0.00–0.07)
Basophils Absolute: 0.1 10*3/uL (ref 0.0–0.1)
Basophils Relative: 1 %
Eosinophils Absolute: 0.2 10*3/uL (ref 0.0–0.5)
Eosinophils Relative: 3 %
HCT: 42.7 % (ref 36.0–46.0)
Hemoglobin: 14.3 g/dL (ref 12.0–15.0)
Immature Granulocytes: 0 %
Lymphocytes Relative: 22 %
Lymphs Abs: 1.8 10*3/uL (ref 0.7–4.0)
MCH: 35.1 pg — ABNORMAL HIGH (ref 26.0–34.0)
MCHC: 33.5 g/dL (ref 30.0–36.0)
MCV: 104.9 fL — ABNORMAL HIGH (ref 80.0–100.0)
Monocytes Absolute: 0.9 10*3/uL (ref 0.1–1.0)
Monocytes Relative: 10 %
Neutro Abs: 5.3 10*3/uL (ref 1.7–7.7)
Neutrophils Relative %: 64 %
Platelets: 36 10*3/uL — ABNORMAL LOW (ref 150–400)
RBC: 4.07 MIL/uL (ref 3.87–5.11)
RDW: 15.4 % (ref 11.5–15.5)
WBC: 8.3 10*3/uL (ref 4.0–10.5)
nRBC: 0 % (ref 0.0–0.2)

## 2023-03-07 MED ORDER — ROMIPLOSTIM INJECTION 500 MCG
6.0000 ug/kg | Freq: Once | SUBCUTANEOUS | Status: AC
  Administered 2023-03-07: 580 ug via SUBCUTANEOUS
  Filled 2023-03-07: qty 1

## 2023-03-07 NOTE — Progress Notes (Signed)
[facility]  [phone] [phone]  ID: Carly [name] OB: 06-14-1971  MR#: [id number]  QIO#:[id number]  Patient Care Team: Dana Allan, MD as PCP - General (Family Medicine) Jeralyn Ruths, MD as Consulting Physician (Oncology) Salena Saner, MD as Consulting Physician (Pulmonary Disease)   CHIEF COMPLAINT: ITP.  INTERVAL HISTORY: Patient returns to clinic today for repeat laboratory work, further evaluation, and continuation of Nplate.  Patient missed her appointment 2 weeks ago secondary to having COVID once again.  She is now fully recovered and back to her baseline.  She currently feels well and is asymptomatic.  She does not complain of any increased bleeding or bruising today. She has no neurologic complaints. She has a good appetite and denies weight loss.  She denies any chest pain, shortness of breath, cough, or hemoptysis. She denies any nausea, vomiting, constipation, or diarrhea.  She has no urinary complaints.  Patient offers no further specific complaints today.  REVIEW OF SYSTEMS:   Review of Systems  Constitutional: Negative.  Negative for fever, malaise/fatigue and weight loss.  Respiratory: Negative.  Negative for cough, hemoptysis and shortness of breath.   Cardiovascular: Negative.  Negative for chest pain and leg swelling.  Gastrointestinal: Negative.  Negative for abdominal pain, blood in stool and melena.  Genitourinary: Negative.  Negative for hematuria.  Musculoskeletal: Negative.  Negative for back pain.  Skin: Negative.  Negative for rash.  Neurological: Negative.  Negative for dizziness, focal weakness, weakness and headaches.  Endo/Heme/Allergies: Negative.  Does not bruise/bleed easily.  Psychiatric/Behavioral: Negative.  The patient is not nervous/anxious.     As per HPI. Otherwise, a complete review of systems is negative.  PAST MEDICAL HISTORY: Past Medical History:  Diagnosis Date   Abnormal  uterine bleeding (AUB) 08/26/2018   Anxiety    C. difficile diarrhea    07/04/21   Chicken pox    Depression    Depression, major, single episode, moderate (HCC) 08/04/2018   Diarrhea 06/07/2022   Dyspnea on exertion 05/24/2022   Elevated testosterone level in female    Emphysema of lung (HCC)    bronchitis   Frequent headaches    Generalized anxiety disorder 08/23/2014   Hay fever    Hypertension    Idiopathic thrombocytopenic purpura (ITP) (HCC)    Iron deficiency 09/02/2017   Positive ANA (antinuclear antibody)    Thrombocytopenia (HCC) 02/12/2016    PAST SURGICAL HISTORY: Past Surgical History:  Procedure Laterality Date   HEMATOMA EVACUATION N/A 04/07/2020   Procedure: EVACUATION HEMATOMA  AND APPLICATION OF PRESSURE DRESSING;  Surgeon: Christeen Douglas, MD;  Location: [facility] ORS;  Service: Gynecology;  Laterality: N/A;   HYSTEROSCOPY WITH NOVASURE N/A 02/21/2018   Procedure: HYSTEROSCOPY WITH NOVASURE, POLYPECTOMY;  Surgeon: Christeen Douglas, MD;  Location: [facility] OR[facility];  Service: Gynecology;  Laterality: N/A;   TUBAL LIGATION  1997   TUBAL LIGATION      FAMILY HISTORY: Family History  Problem Relation Age of Onset   Hyperlipidemia Mother    Heart disease Mother    Stroke Mother    Depression Mother    Mental illness Mother    Heart attack Mother 102   Hyperlipidemia Father    Depression Father    Mental illness Father    Diabetes Paternal Grandfather    Cancer Cousin     ADVANCED DIRECTIVES (Y/N):  N  HEALTH MAINTENANCE: Social History   Tobacco Use   Smoking status: Every Day    Current packs/day: 1.50  Average packs/day: 1.5 packs/day for 28.0 years (42.0 ttl pk-yrs)    Types: Cigarettes   Smokeless tobacco: Never   Tobacco comments:    1.5 ppd increase d/t death of sister.  9/6 hfb    0.5 PPD 05/24/2022-khj  Vaping Use   Vaping status: Never Used  Substance Use Topics   Alcohol use: Yes    Alcohol/week: 8.0 standard drinks of alcohol    Types: 8  Standard drinks or equivalent per week    Comment: Beer (Can)    Drug use: No    Comment: CBD oil     Colonoscopy:  PAP:  Bone density:  Lipid panel:  Allergies  Allergen Reactions   Wellbutrin [Bupropion]     Crazy    Meloxicam Nausea Only    Current Outpatient Medications  Medication Sig Dispense Refill   acetaminophen (TYLENOL) 500 MG tablet Take 500 mg by mouth every 6 (six) hours as needed.     albuterol (PROVENTIL) (2.5 MG/3ML) 0.083% nebulizer solution Take 3 mLs (2.5 mg total) by nebulization every 6 (six) hours as needed for wheezing or shortness of breath. 360 mL 1   albuterol (VENTOLIN HFA) 108 (90 Base) MCG/ACT inhaler INHALE 2 PUFFS INTO THE LUNGS EVERY 6 HOURS AS NEEDED FOR WHEEZING OR SHORTNESS OF BREATH (COUGH) 6.7 each 3   amLODipine (NORVASC) 10 MG tablet Take 1 tablet (10 mg total) by mouth daily. 90 tablet 3   ARIPiprazole (ABILIFY) 2 MG tablet Take 2 tablets (4 mg total) by mouth daily. Dc rexulti not covered     benzonatate (TESSALON) 200 MG capsule Take 1 capsule (200 mg total) by mouth 2 (two) times daily as needed for cough. 20 capsule 0   BREZTRI AEROSPHERE 160-9-4.8 MCG/ACT AERO INHALE 2 PUFFS INTO THE LUNGS IN THE MORNING AND AT BEDTIME. 10.7 each 5   Cholecalciferol 1.25 MG (50000 UT) capsule Take 1 capsule (50,000 Units total) by mouth every 30 (thirty) days. 1x per month note this 13 capsule 0   cyanocobalamin (VITAMIN B12) 1000 MCG/ML injection INJECT 1 ML (1,000 MCG TOTAL) INTO THE MUSCLE EVERY 30 DAYS. 1 mL 15   cyclobenzaprine (FLEXERIL) 10 MG tablet Take 1 tablet (10 mg total) by mouth 3 (three) times daily as needed for muscle spasms. 15 tablet 0   diclofenac Sodium (VOLTAREN) 1 % GEL Apply 2-4 g topically 4 (four) times daily. 2 grams upper body qid prn and 4 gram lower body qid prn 150 g 11   escitalopram (LEXAPRO) 20 MG tablet Take 1 tablet (20 mg total) by mouth daily.     fluticasone (FLONASE) 50 MCG/ACT nasal spray Place 2 sprays into both  nostrils daily. 16 g 0   lamoTRIgine (LAMICTAL) 100 MG tablet Take 100 mg by mouth daily. Send refills to Dr Rolm Bookbinder MD     levocetirizine (XYZAL) 5 MG tablet Take 1 tablet (5 mg total) by mouth every evening. 30 tablet 0   metoprolol succinate (TOPROL-XL) 100 MG 24 hr tablet Take 1 tablet (100 mg total) by mouth daily. Take with or immediately following a meal. 30 tablet 2   naproxen (NAPROSYN) 500 MG tablet Take 1 tablet (500 mg total) by mouth 2 (two) times daily with a meal. 30 tablet 0   omeprazole (PRILOSEC) 20 MG capsule Take 1 capsule (20 mg total) by mouth daily. 90 capsule 1   traZODone (DESYREL) 50 MG tablet TAKE 0.5-1 TABLETS BY MOUTH AT BEDTIME AS NEEDED FOR SLEEP.  No current facility-administered medications for this visit.    OBJECTIVE: Vitals:   03/07/23 1354  BP: 134/69  Pulse: 78  Resp: (!) 22  Temp: (!) 97.4 F (36.3 C)  SpO2: 97%     Body mass index is 38.96 kg/m.    ECOG FS:0 - Asymptomatic  General: Well-developed, well-nourished, no acute distress. Eyes: Pink conjunctiva, anicteric sclera. HEENT: Normocephalic, moist mucous membranes. Lungs: No audible wheezing or coughing. Heart: Regular rate and rhythm. Abdomen: Soft, nontender, no obvious distention. Musculoskeletal: No edema, cyanosis, or clubbing. Neuro: Alert, answering all questions appropriately. Cranial nerves grossly intact. Skin: No rashes or petechiae noted. Psych: Normal affect.  LAB RESULTS:  Lab Results  Component Value Date   NA 139 11/06/2022   K 3.7 11/06/2022   CL 98 11/06/2022   CO2 29 11/06/2022   GLUCOSE 115 (H) 11/06/2022   BUN 2 (L) 11/06/2022   CREATININE 0.66 11/06/2022   CALCIUM 8.1 (L) 11/06/2022   PROT 6.1 11/06/2022   ALBUMIN 3.7 11/06/2022   AST 70 (H) 11/06/2022   ALT 37 (H) 11/06/2022   ALKPHOS 123 (H) 11/06/2022   BILITOT 0.6 11/06/2022   GFRNONAA >60 09/10/2022   GFRAA >60 04/08/2020    Lab Results  Component Value Date   WBC 8.3 03/07/2023    NEUTROABS 5.3 03/07/2023   HGB 14.3 03/07/2023   HCT 42.7 03/07/2023   MCV 104.9 (H) 03/07/2023   PLT 36 (L) 03/07/2023     STUDIES: No results found.  ASSESSMENT: ITP  PLAN:    ITP: Confirmed with normal bone marrow biopsy and normal FISH and cytogenetics on July 22, 2019.  Patient had a positive ANA which is likely clinically insignificant.  The remainder of her laboratory work was either negative or within normal limits. CT scan on March 04, 2017 did not reveal splenomegaly. Previously, patient received 1 week of prednisone at 1 mg/kg dose and had no appreciable change in her platelet count.  Rituxan was denied by insurance. In January 2024 patient's platelet count increased from 38-191 with 6 mcg/kg of Nplate.  Patient's current platelet count is 36, therefore we will proceed with treatment today.  Return to clinic in 8 weeks for repeat laboratory work, further evaluation, and continuation of treatment if needed.   History of positive ANA: Likely clinically insignificant.  Referral was previously sent to rheumatology. COVID: Resolved.  I spent a total of 30 minutes reviewing chart data, face-to-face evaluation with the patient, counseling and coordination of care as detailed above.  Patient expressed understanding and was in agreement with this plan. She also understands that She can call clinic at any time with any questions, concerns, or complaints.    Jeralyn Ruths, MD   03/07/2023 4:05 PM

## 2023-03-07 NOTE — Progress Notes (Signed)
Nplate dose 580 mcg today per Dr Orlie Dakin.  Ebony Hail, Pharm.D., CPP 03/07/2023@2 :31 PM

## 2023-03-08 NOTE — Addendum Note (Signed)
Addended by: Corene Cornea on: 03/08/2023 08:00 AM   Modules accepted: Orders

## 2023-03-11 ENCOUNTER — Other Ambulatory Visit: Payer: Self-pay | Admitting: Pulmonary Disease

## 2023-03-20 ENCOUNTER — Ambulatory Visit (INDEPENDENT_AMBULATORY_CARE_PROVIDER_SITE_OTHER): Payer: BC Managed Care – PPO | Admitting: Family Medicine

## 2023-03-20 ENCOUNTER — Encounter: Payer: Self-pay | Admitting: Family Medicine

## 2023-03-20 ENCOUNTER — Ambulatory Visit: Payer: BC Managed Care – PPO

## 2023-03-20 VITALS — BP 132/72 | HR 98 | Temp 97.7°F | Resp 16 | Ht 62.0 in | Wt 213.5 lb

## 2023-03-20 DIAGNOSIS — N393 Stress incontinence (female) (male): Secondary | ICD-10-CM

## 2023-03-20 DIAGNOSIS — R399 Unspecified symptoms and signs involving the genitourinary system: Secondary | ICD-10-CM

## 2023-03-20 LAB — POC URINALSYSI DIPSTICK (AUTOMATED)
Bilirubin, UA: NEGATIVE
Blood, UA: NEGATIVE
Glucose, UA: NEGATIVE
Ketones, UA: NEGATIVE
Leukocytes, UA: NEGATIVE
Nitrite, UA: NEGATIVE
Protein, UA: NEGATIVE
Spec Grav, UA: 1.01 (ref 1.010–1.025)
Urobilinogen, UA: 0.2 U/dL
pH, UA: 6 (ref 5.0–8.0)

## 2023-03-20 MED ORDER — OXYBUTYNIN CHLORIDE ER 5 MG PO TB24
5.0000 mg | ORAL_TABLET | Freq: Every day | ORAL | 0 refills | Status: AC
Start: 2023-03-20 — End: ?

## 2023-03-20 NOTE — Progress Notes (Signed)
SUBJECTIVE:   Chief Complaint  Patient presents with   Urinary Frequency    Goes to the bathroom 6-7 times during the night. Been going on for a couple months.   HPI Presents to clinic for acute visit  Urinary frequency Ongoing for 2 months.  Endorses nocturia 6-7 x nightly.  Urinary leakage when coughing, laughing and sneezing.  Has decreased nighttime fluid intake, caffeine and has not made difference.  Denies any fevers, abdominal pain, dysuria, vaginal discharge, dyspareunia or vaginal bleeding.   PERTINENT PMH / PSH: Tobacco use Obesity class 2  OBJECTIVE:  BP 132/72   Pulse 98   Temp 97.7 F (36.5 C)   Resp 16   Ht 5\' 2"  (1.575 m)   Wt 213 lb 8 oz (96.8 kg)   SpO2 97%   BMI 39.05 kg/m    Physical Exam Vitals reviewed.  Constitutional:      General: She is not in acute distress.    Appearance: Normal appearance. She is normal weight. She is not ill-appearing, toxic-appearing or diaphoretic.  Eyes:     General:        Right eye: No discharge.        Left eye: No discharge.     Conjunctiva/sclera: Conjunctivae normal.  Cardiovascular:     Rate and Rhythm: Normal rate and regular rhythm.  Pulmonary:     Effort: Pulmonary effort is normal.  Skin:    General: Skin is warm and dry.  Neurological:     General: No focal deficit present.     Mental Status: She is alert and oriented to person, place, and time. Mental status is at baseline.  Psychiatric:        Mood and Affect: Mood normal.        Behavior: Behavior normal.        Thought Content: Thought content normal.        Judgment: Judgment normal.        03/20/2023    1:37 PM 02/11/2023    9:32 AM 08/06/2022    2:56 PM 05/24/2022   10:03 AM 03/21/2022    1:47 PM  Depression screen PHQ 2/9  Decreased Interest 0 0 0 1 1  Down, Depressed, Hopeless 0 0 0 1 1  PHQ - 2 Score 0 0 0 2 2  Altered sleeping 0 0 1 2 2   Tired, decreased energy 2 0 0 2 3  Change in appetite 0 2 0 3 2  Feeling bad or failure  about yourself  0 0 0 0 1  Trouble concentrating 0 0 1 1 3   Moving slowly or fidgety/restless 0 0 0 0 0  Suicidal thoughts 0 0 0 0 0  PHQ-9 Score 2 2 2 10 13   Difficult doing work/chores Not difficult at all Not difficult at all Not difficult at all Somewhat difficult Not difficult at all      03/20/2023    1:37 PM 02/11/2023    9:32 AM 08/06/2022    2:57 PM 05/24/2022   10:08 AM  GAD 7 : Generalized Anxiety Score  Nervous, Anxious, on Edge 0 0 1 1  Control/stop worrying 0 0 1 0  Worry too much - different things 0 0 1 1  Trouble relaxing 0 0 1 1  Restless 0 0 1 1  Easily annoyed or irritable 0 0 2 2  Afraid - awful might happen 0 0 1 0  Total GAD 7 Score 0 0 8 6  Anxiety Difficulty Not difficult at all Not difficult at all Somewhat difficult Somewhat difficult    ASSESSMENT/PLAN:  Stress incontinence Assessment & Plan: Urine negative for infection Trial Oxybutynin XL 5 mg at night If no improvement in symptoms can refer to urology   Orders: -     oxyBUTYnin Chloride ER; Take 1 tablet (5 mg total) by mouth at bedtime.  Dispense: 30 tablet; Refill: 0  UTI symptoms Assessment & Plan: Urine negative for infection  Orders: -     POCT Urinalysis Dipstick (Automated)   PDMP reviewed  Return if symptoms worsen or fail to improve, for PCP.  Dana Allan, MD

## 2023-03-20 NOTE — Patient Instructions (Addendum)
It was a pleasure meeting you today. Thank you for allowing me to take part in your health care.  Our goals for today as we discussed include:  Urine negative for infection  Start Ditropan XL 5 mg at night If no improvement in symptoms can refer to Urology Kegel Exercises Continue to limit fluids before bedtime Recommend smoking cessation.  1-800-QUITNOW  Follow up as needed  Recommend Shingles vaccine.  This is a 2 dose series and can be given at your local pharmacy.  Please talk to your pharmacist about this.   PAP smear due. Please schedule appointment at your earliest convenience  If you have any questions or concerns, please do not hesitate to call the office at [phone].  I look forward to our next visit and until then take care and stay safe.  Regards,   Dana Allan, MD   [facility]

## 2023-03-21 ENCOUNTER — Telehealth: Payer: BC Managed Care – PPO | Admitting: Physician Assistant

## 2023-03-21 ENCOUNTER — Ambulatory Visit: Payer: BC Managed Care – PPO

## 2023-03-21 DIAGNOSIS — J441 Chronic obstructive pulmonary disease with (acute) exacerbation: Secondary | ICD-10-CM | POA: Diagnosis not present

## 2023-03-21 MED ORDER — PREDNISONE 10 MG PO TABS: ORAL_TABLET | ORAL | 0 refills | Status: DC

## 2023-03-21 MED ORDER — BENZONATATE 100 MG PO CAPS
100.0000 mg | ORAL_CAPSULE | Freq: Three times a day (TID) | ORAL | 0 refills | Status: DC | PRN
Start: 2023-03-21 — End: 2023-06-24

## 2023-03-21 MED ORDER — AMOXICILLIN-POT CLAVULANATE 875-125 MG PO TABS: 1.0000 | ORAL_TABLET | Freq: Two times a day (BID) | ORAL | 0 refills | Status: DC

## 2023-03-21 NOTE — Progress Notes (Signed)
Virtual Visit Consent   Carly [name], you are scheduled for a virtual visit with a [facility] provider today. Just as with appointments in the office, your consent must be obtained to participate. Your consent will be active for this visit and any virtual visit you may have with one of our providers in the next 365 days. If you have a MyChart account, a copy of this consent can be sent to you electronically.  As this is a virtual visit, video technology does not allow for your provider to perform a traditional examination. This may limit your provider's ability to fully assess your condition. If your provider identifies any concerns that need to be evaluated in person or the need to arrange testing (such as labs, EKG, etc.), we will make arrangements to do so. Although advances in technology are sophisticated, we cannot ensure that it will always work on either your end or our end. If the connection with a video visit is poor, the visit may have to be switched to a telephone visit. With either a video or telephone visit, we are not always able to ensure that we have a secure connection.  By engaging in this virtual visit, you consent to the provision of healthcare and authorize for your insurance to be billed (if applicable) for the services provided during this visit. Depending on your insurance coverage, you may receive a charge related to this service.  I need to obtain your verbal consent now. Are you willing to proceed with your visit today? Carly [name] has provided verbal consent on 03/21/2023 for a virtual visit (video or telephone). [name], PA-C  Date: 03/21/2023 9:10 AM  Virtual Visit via Video Note   I, [name], [name]  ([id number], [birth year]) on 03/21/23 at  9:15 AM EDT by a video-enabled telemedicine application and verified that I am speaking with the correct person using two identifiers.  Location: Patient: Virtual Visit Location  Patient: Mobile Provider: Virtual Visit Location Provider: Home Office   I discussed the limitations of evaluation and management by telemedicine and the availability of in person appointments. The patient expressed understanding and agreed to proceed.    History of Present Illness: Carly [name] is a 52 y.o. who identifies as a female who was assigned female at birth, and is being seen today for cough.  HPI: Cough This is a recurrent problem. The current episode started in the past 7 days. The problem has been gradually worsening. The problem occurs constantly. The cough is Productive of sputum and productive of purulent sputum. Associated symptoms include headaches (sinus), nasal congestion, postnasal drip, a sore throat (from drainage), shortness of breath and wheezing. Pertinent negatives include no chills, ear congestion, ear pain, fever, myalgias or rhinorrhea. The symptoms are aggravated by lying down. Risk factors for lung disease include smoking/tobacco exposure. She has tried ipratropium inhaler, a beta-agonist inhaler and OTC cough suppressant (prescription inhalers, nebulizer, and Mucinex DM) for the symptoms. Her past medical history is significant for bronchitis, COPD and environmental allergies.     Problems:  Patient Active Problem List   Diagnosis Date Noted   Hypokalemia 09/09/2022   Hyponatremia 09/09/2022   COVID-19 09/09/2022   Tachycardia 09/09/2022   Pulmonary nodules 09/09/2022   Prolonged QT interval 09/09/2022   COPD exacerbation (HCC) 09/08/2022   Emphysema lung (HCC) 08/08/2022   Aortic atherosclerosis (HCC) 08/08/2022   Mood disorder (HCC) 08/08/2022   Colon cancer screening 08/08/2022  Breast cancer screening by mammogram 08/08/2022   Abnormal glucose 08/08/2022   Anxiety and depression 03/21/2022   Allergic rhinitis 11/24/2021   Lumbar radiculopathy 07/11/2021   Gastroesophageal reflux disease without esophagitis 07/01/2020   Hyperlipidemia 07/01/2020    Fatty liver 12/24/2019   Angiolipoma of left kidney 12/24/2019   Chronic ITP (idiopathic thrombocytopenic purpura) (HCC) 12/02/2019   Plantar fasciitis, bilateral 09/29/2019   Osteoarthritis of both knees 09/29/2019   Overactive bladder 07/14/2019   B12 deficiency 06/10/2019   Vitamin D deficiency 06/10/2019   Insomnia 08/15/2016   Thrombocytopenia (HCC) 02/12/2016   Carpal tunnel syndrome 01/20/2016   Morbid obesity (HCC) 01/04/2016   Essential hypertension 01/04/2016   Asthma-COPD overlap syndrome 01/13/2015   Female hirsutism 09/22/2014   Elevated testosterone level in female 09/22/2014   Tobacco use disorder 08/23/2014   Obesity (BMI 30-39.9) 08/23/2014   HTN (hypertension) 08/23/2014    Allergies:  Allergies  Allergen Reactions   Wellbutrin [Bupropion]     Crazy    Meloxicam Nausea Only   Medications:  Current Outpatient Medications:    acetaminophen (TYLENOL) 500 MG tablet, Take 500 mg by mouth every 6 (six) hours as needed., Disp: , Rfl:    albuterol (PROVENTIL) (2.5 MG/3ML) 0.083% nebulizer solution, Take 3 mLs (2.5 mg total) by nebulization every 6 (six) hours as needed for wheezing or shortness of breath., Disp: 360 mL, Rfl: 1   albuterol (VENTOLIN HFA) 108 (90 Base) MCG/ACT inhaler, INHALE 2 PUFFS INTO THE LUNGS EVERY 6 HOURS AS NEEDED FOR WHEEZING OR SHORTNESS OF BREATH (COUGH), Disp: 6.7 each, Rfl: 3   amLODipine (NORVASC) 10 MG tablet, Take 1 tablet (10 mg total) by mouth daily., Disp: 90 tablet, Rfl: 3   ARIPiprazole (ABILIFY) 2 MG tablet, Take 2 tablets (4 mg total) by mouth daily. Dc rexulti not covered, Disp: , Rfl:    benzonatate (TESSALON) 200 MG capsule, Take 1 capsule (200 mg total) by mouth 2 (two) times daily as needed for cough., Disp: 20 capsule, Rfl: 0   BREZTRI AEROSPHERE 160-9-4.8 MCG/ACT AERO, INHALE 2 PUFFS INTO THE LUNGS IN THE MORNING AND AT BEDTIME., Disp: 10.7 each, Rfl: 5   Cholecalciferol 1.25 MG (50000 UT) capsule, Take 1 capsule (50,000  Units total) by mouth every 30 (thirty) days. 1x per month note this, Disp: 13 capsule, Rfl: 0   cyanocobalamin (VITAMIN B12) 1000 MCG/ML injection, INJECT 1 ML (1,000 MCG TOTAL) INTO THE MUSCLE EVERY 30 DAYS., Disp: 1 mL, Rfl: 15   cyclobenzaprine (FLEXERIL) 10 MG tablet, Take 1 tablet (10 mg total) by mouth 3 (three) times daily as needed for muscle spasms., Disp: 15 tablet, Rfl: 0   diclofenac Sodium (VOLTAREN) 1 % GEL, Apply 2-4 g topically 4 (four) times daily. 2 grams upper body qid prn and 4 gram lower body qid prn, Disp: 150 g, Rfl: 11   escitalopram (LEXAPRO) 20 MG tablet, Take 1 tablet (20 mg total) by mouth daily., Disp: , Rfl:    fluticasone (FLONASE) 50 MCG/ACT nasal spray, Place 2 sprays into both nostrils daily., Disp: 16 g, Rfl: 0   lamoTRIgine (LAMICTAL) 100 MG tablet, Take 100 mg by mouth daily. Send refills to Dr Rolm Bookbinder MD, Disp: , Rfl:    levocetirizine (XYZAL) 5 MG tablet, Take 1 tablet (5 mg total) by mouth every evening., Disp: 30 tablet, Rfl: 0   metoprolol succinate (TOPROL-XL) 100 MG 24 hr tablet, Take 1 tablet (100 mg total) by mouth daily. Take with or immediately following a  meal., Disp: 30 tablet, Rfl: 2   naproxen (NAPROSYN) 500 MG tablet, Take 1 tablet (500 mg total) by mouth 2 (two) times daily with a meal., Disp: 30 tablet, Rfl: 0   omeprazole (PRILOSEC) 20 MG capsule, Take 1 capsule (20 mg total) by mouth daily., Disp: 90 capsule, Rfl: 1   oxybutynin (DITROPAN XL) 5 MG 24 hr tablet, Take 1 tablet (5 mg total) by mouth at bedtime., Disp: 30 tablet, Rfl: 0   traZODone (DESYREL) 50 MG tablet, TAKE 0.5-1 TABLETS BY MOUTH AT BEDTIME AS NEEDED FOR SLEEP., Disp: , Rfl:   Observations/Objective: Patient is well-developed, well-nourished in no acute distress.  Resting comfortably Head is normocephalic, atraumatic.  No labored breathing.  Speech is clear and coherent with logical content.  Patient is alert and oriented at baseline.    Assessment and Plan: There  are no diagnoses linked to this encounter. - Worsening over a week despite OTC medications - Will treat with Augmentin, Prednisone and tessalon perles - Continue home inhalers and nebulizer treatments as needed - Can continue Mucinex  - Push fluids.  - Rest.  - Steam and humidifier can help - Seek in person evaluation if worsening or symptoms fail to improve    Follow Up Instructions: I discussed the assessment and treatment plan with the patient. The patient was provided an opportunity to ask questions and all were answered. The patient agreed with the plan and demonstrated an understanding of the instructions.  A copy of instructions were sent to the patient via MyChart unless otherwise noted below.    The patient was advised to call back or seek an in-person evaluation if the symptoms worsen or if the condition fails to improve as anticipated.  Time:  I spent 10 minutes with the patient via telehealth technology discussing the above problems/concerns.    [name], PA-C

## 2023-03-21 NOTE — Patient Instructions (Signed)
[name], thank you for joining Carly Loveless, PA-C for today's virtual visit.  While this provider is not your primary care provider (PCP), if your PCP is located in our provider database this encounter information will be shared with them immediately following your visit.   A [facility] MyChart account gives you access to today's visit and all your visits, tests, and labs performed at [facility] " click here if you don't have a [facility] MyChart account or go to [email or web address]  Consent: (Patient) Carly [name] provided verbal consent for this virtual visit at the beginning of the encounter.  Current Medications:  Current Outpatient Medications:    amoxicillin-clavulanate (AUGMENTIN) 875-125 MG tablet, Take 1 tablet by mouth 2 (two) times daily., Disp: 20 tablet, Rfl: 0   benzonatate (TESSALON) 100 MG capsule, Take 1-2 capsules (100-200 mg total) by mouth 3 (three) times daily as needed., Disp: 30 capsule, Rfl: 0   predniSONE (DELTASONE) 10 MG tablet, Days 1-4 take 4 tablets (40 mg) daily  Days 5-8 take 3 tablets (30 mg) daily, Days 9-11 take 2 tablets (20 mg) daily, Days 12-14 take 1 tablet (10 mg) daily., Disp: 37 tablet, Rfl: 0   acetaminophen (TYLENOL) 500 MG tablet, Take 500 mg by mouth every 6 (six) hours as needed., Disp: , Rfl:    albuterol (PROVENTIL) (2.5 MG/3ML) 0.083% nebulizer solution, Take 3 mLs (2.5 mg total) by nebulization every 6 (six) hours as needed for wheezing or shortness of breath., Disp: 360 mL, Rfl: 1   albuterol (VENTOLIN HFA) 108 (90 Base) MCG/ACT inhaler, INHALE 2 PUFFS INTO THE LUNGS EVERY 6 HOURS AS NEEDED FOR WHEEZING OR SHORTNESS OF BREATH (COUGH), Disp: 6.7 each, Rfl: 3   amLODipine (NORVASC) 10 MG tablet, Take 1 tablet (10 mg total) by mouth daily., Disp: 90 tablet, Rfl: 3   ARIPiprazole (ABILIFY) 2 MG tablet, Take 2 tablets (4 mg total) by mouth daily. Dc rexulti not covered, Disp: , Rfl:    BREZTRI AEROSPHERE 160-9-4.8  MCG/ACT AERO, INHALE 2 PUFFS INTO THE LUNGS IN THE MORNING AND AT BEDTIME., Disp: 10.7 each, Rfl: 5   Cholecalciferol 1.25 MG (50000 UT) capsule, Take 1 capsule (50,000 Units total) by mouth every 30 (thirty) days. 1x per month note this, Disp: 13 capsule, Rfl: 0   cyanocobalamin (VITAMIN B12) 1000 MCG/ML injection, INJECT 1 ML (1,000 MCG TOTAL) INTO THE MUSCLE EVERY 30 DAYS., Disp: 1 mL, Rfl: 15   cyclobenzaprine (FLEXERIL) 10 MG tablet, Take 1 tablet (10 mg total) by mouth 3 (three) times daily as needed for muscle spasms., Disp: 15 tablet, Rfl: 0   diclofenac Sodium (VOLTAREN) 1 % GEL, Apply 2-4 g topically 4 (four) times daily. 2 grams upper body qid prn and 4 gram lower body qid prn, Disp: 150 g, Rfl: 11   escitalopram (LEXAPRO) 20 MG tablet, Take 1 tablet (20 mg total) by mouth daily., Disp: , Rfl:    fluticasone (FLONASE) 50 MCG/ACT nasal spray, Place 2 sprays into both nostrils daily., Disp: 16 g, Rfl: 0   lamoTRIgine (LAMICTAL) 100 MG tablet, Take 100 mg by mouth daily. Send refills to Dr Rolm Bookbinder MD, Disp: , Rfl:    levocetirizine (XYZAL) 5 MG tablet, Take 1 tablet (5 mg total) by mouth every evening., Disp: 30 tablet, Rfl: 0   metoprolol succinate (TOPROL-XL) 100 MG 24 hr tablet, Take 1 tablet (100 mg total) by mouth daily. Take with or immediately following a meal., Disp: 30 tablet, Rfl: 2  naproxen (NAPROSYN) 500 MG tablet, Take 1 tablet (500 mg total) by mouth 2 (two) times daily with a meal., Disp: 30 tablet, Rfl: 0   omeprazole (PRILOSEC) 20 MG capsule, Take 1 capsule (20 mg total) by mouth daily., Disp: 90 capsule, Rfl: 1   oxybutynin (DITROPAN XL) 5 MG 24 hr tablet, Take 1 tablet (5 mg total) by mouth at bedtime., Disp: 30 tablet, Rfl: 0   traZODone (DESYREL) 50 MG tablet, TAKE 0.5-1 TABLETS BY MOUTH AT BEDTIME AS NEEDED FOR SLEEP., Disp: , Rfl:    Medications ordered in this encounter:  Meds ordered this encounter  Medications   predniSONE (DELTASONE) 10 MG tablet    Sig:  Days 1-4 take 4 tablets (40 mg) daily  Days 5-8 take 3 tablets (30 mg) daily, Days 9-11 take 2 tablets (20 mg) daily, Days 12-14 take 1 tablet (10 mg) daily.    Dispense:  37 tablet    Refill:  0    Order Specific Question:   Supervising Provider    Answer:   Merrilee Jansky [id number]   amoxicillin-clavulanate (AUGMENTIN) 875-125 MG tablet    Sig: Take 1 tablet by mouth 2 (two) times daily.    Dispense:  20 tablet    Refill:  0    Order Specific Question:   Supervising Provider    Answer:   Merrilee Jansky [[id number]]   benzonatate (TESSALON) 100 MG capsule    Sig: Take 1-2 capsules (100-200 mg total) by mouth 3 (three) times daily as needed.    Dispense:  30 capsule    Refill:  0    Order Specific Question:   Supervising Provider    Answer:   Merrilee Jansky [id number]     *If you need refills on other medications prior to your next appointment, please contact your pharmacy*  Follow-Up: Call back or seek an in-person evaluation if the symptoms worsen or if the condition fails to improve as anticipated.  [facility] [phone]  Other Instructions Chronic Obstructive Pulmonary Disease Exacerbation  Chronic obstructive pulmonary disease (COPD) is a long-term (chronic) lung problem. When you have COPD, it can feel harder to breathe in or out. COPD exacerbation is a flare-up of symptoms when breathing gets worse and more treatment may be needed. Without treatment, flare-ups can be life-threatening. If they happen often, your lungs can become more damaged. What are the causes? Not taking your usual COPD medicines as told by your health care provider. A cold or the flu, which can cause infection in your lungs. Being exposed to things that make your breathing worse, such as: Smoke. Air pollution. Fumes. Dust. Allergies. Weather changes. What are the signs or symptoms? Symptoms do not get better or get worse even if you take your medicines as told by your  provider. Symptoms may include: More shortness of breath. You may only be able to speak one or two words at a time. More coughing or mucus from your lungs. More wheezing or chest tightness. Being more tired and having less energy. Confusion. How is this diagnosed? This condition is diagnosed based on: Symptoms that get worse. Your medical history. A physical exam. You may also have tests, including: A chest X-ray. Blood or mucus tests. How is this treated? You may be able to stay home or you may need to go to the hospital. Treatment may include: Taking medicines. These may include: Inhalers. These have medicines in them that you breathe in. These may be  more of what you already take or they may be new. Steroids. These reduce inflammation in the airways. These may be inhaled, taken by mouth, or given in an IV. Antibiotics. These treat infection. Using oxygen. Using a device to help you clear mucus. Follow these instructions at home: Medicines Take your medicines only as told by your provider. If you were given antibiotics or steroids, take them as told by your provider. Do not stop taking them even if you start to feel better. Lifestyle Several times a day, wash your hands with soap and water for at least 20 seconds. If you cannot use soap and water, use hand sanitizer. This may help keep you from getting an infection. Avoid being around crowds or people who are sick. Do not smoke or use any products that contain nicotine or tobacco. If you need help quitting, ask your provider. Return to your normal activities when your provider says that it's safe. Use breathing methods to control your stress and catch your breath. How is this prevented? Follow your COPD action plan. The action plan tells you what to do if you're feeling good and what to do when you start feeling worse. Discuss the plan often with your provider. Make sure you get all the shots, also called vaccines, that your  provider recommends. Ask your provider about a flu shot and a pneumonia shot. Use oxygen therapy if told by your provider. If you need home oxygen therapy, ask your provider how often to check your oxygen level with a device called an oximeter. Keep all follow-up visits to review your COPD action plan. Your provider will want to check on your condition often to keep you healthy and out of the hospital. Contact a health care provider if: Your COPD symptoms get worse. You have a fever or chills. You have trouble doing daily activities. You have trouble breathing even when you are resting. Get help right away if: You are short of breath and cannot: Talk in full sentences. Do normal activities. You have chest pain. You feel confused. These symptoms may be an emergency. Call 911 right away. Do not wait to see if the symptoms will go away. Do not drive yourself to the hospital. This information is not intended to replace advice given to you by your health care provider. Make sure you discuss any questions you have with your health care provider. Document Revised: 09/17/2022 Document Reviewed: 09/17/2022 Elsevier Patient Education  2024 Elsevier Inc.    If you have been instructed to have an in-person evaluation today at a local Urgent Care facility, please use the link below. It will take you to a list of all of our available [facility], including address, phone number and hours of operation. Please do not delay care.  [facility]  If you or a family member do not have a primary care provider, use the link below to schedule a visit and establish care. When you choose a [facility] primary care physician or advanced practice provider, you gain a long-term partner in health. Find a Primary Care Provider  Learn more about [facility]'s in-office and virtual care options:  - Get Care Now

## 2023-03-25 ENCOUNTER — Other Ambulatory Visit: Payer: Self-pay | Admitting: Pulmonary Disease

## 2023-03-27 ENCOUNTER — Emergency Department: Payer: BC Managed Care – PPO

## 2023-03-27 ENCOUNTER — Other Ambulatory Visit: Payer: Self-pay

## 2023-03-27 ENCOUNTER — Encounter: Payer: Self-pay | Admitting: Emergency Medicine

## 2023-03-27 ENCOUNTER — Observation Stay
Admission: EM | Admit: 2023-03-27 | Discharge: 2023-03-28 | Disposition: A | Discharge status: Home / Self Care | Payer: BC Managed Care – PPO | Attending: Student in an Organized Health Care Education/Training Program | Admitting: Student in an Organized Health Care Education/Training Program

## 2023-03-27 ENCOUNTER — Ambulatory Visit: Payer: BC Managed Care – PPO

## 2023-03-27 DIAGNOSIS — J441 Chronic obstructive pulmonary disease with (acute) exacerbation: Principal | ICD-10-CM

## 2023-03-27 DIAGNOSIS — R918 Other nonspecific abnormal finding of lung field: Secondary | ICD-10-CM | POA: Diagnosis present

## 2023-03-27 DIAGNOSIS — Z1231 Encounter for screening mammogram for malignant neoplasm of breast: Secondary | ICD-10-CM

## 2023-03-27 DIAGNOSIS — F17200 Nicotine dependence, unspecified, uncomplicated: Secondary | ICD-10-CM | POA: Diagnosis present

## 2023-03-27 DIAGNOSIS — D696 Thrombocytopenia, unspecified: Secondary | ICD-10-CM

## 2023-03-27 DIAGNOSIS — J440 Chronic obstructive pulmonary disease with acute lower respiratory infection: Secondary | ICD-10-CM | POA: Diagnosis present

## 2023-03-27 DIAGNOSIS — F39 Unspecified mood [affective] disorder: Secondary | ICD-10-CM | POA: Diagnosis present

## 2023-03-27 DIAGNOSIS — J209 Acute bronchitis, unspecified: Secondary | ICD-10-CM | POA: Diagnosis present

## 2023-03-27 DIAGNOSIS — R0602 Shortness of breath: Secondary | ICD-10-CM | POA: Diagnosis present

## 2023-03-27 DIAGNOSIS — R911 Solitary pulmonary nodule: Secondary | ICD-10-CM | POA: Insufficient documentation

## 2023-03-27 DIAGNOSIS — I10 Essential (primary) hypertension: Secondary | ICD-10-CM | POA: Diagnosis present

## 2023-03-27 DIAGNOSIS — IMO0001 Reserved for inherently not codable concepts without codable children: Secondary | ICD-10-CM | POA: Diagnosis present

## 2023-03-27 DIAGNOSIS — J45909 Unspecified asthma, uncomplicated: Secondary | ICD-10-CM | POA: Diagnosis not present

## 2023-03-27 DIAGNOSIS — Z7951 Long term (current) use of inhaled steroids: Secondary | ICD-10-CM | POA: Diagnosis not present

## 2023-03-27 DIAGNOSIS — F17210 Nicotine dependence, cigarettes, uncomplicated: Secondary | ICD-10-CM | POA: Insufficient documentation

## 2023-03-27 DIAGNOSIS — K219 Gastro-esophageal reflux disease without esophagitis: Secondary | ICD-10-CM | POA: Diagnosis present

## 2023-03-27 DIAGNOSIS — Z716 Tobacco abuse counseling: Secondary | ICD-10-CM | POA: Diagnosis not present

## 2023-03-27 DIAGNOSIS — D693 Immune thrombocytopenic purpura: Secondary | ICD-10-CM | POA: Diagnosis present

## 2023-03-27 LAB — RESPIRATORY PANEL BY PCR

## 2023-03-27 LAB — CBC
HCT: 47.2 % — ABNORMAL HIGH (ref 36.0–46.0)
Hemoglobin: 16.2 g/dL — ABNORMAL HIGH (ref 12.0–15.0)
MCH: 34.7 pg — ABNORMAL HIGH (ref 26.0–34.0)
MCHC: 34.3 g/dL (ref 30.0–36.0)
MCV: 101.1 fL — ABNORMAL HIGH (ref 80.0–100.0)
Platelets: 39 10*3/uL — ABNORMAL LOW (ref 150–400)
RBC: 4.67 MIL/uL (ref 3.87–5.11)
RDW: 14.6 % (ref 11.5–15.5)
WBC: 9.1 10*3/uL (ref 4.0–10.5)
nRBC: 0 % (ref 0.0–0.2)

## 2023-03-27 LAB — BASIC METABOLIC PANEL
Anion gap: 10 (ref 5–15)
BUN: 13 mg/dL (ref 6–20)
CO2: 25 mmol/L (ref 22–32)
Calcium: 8.4 mg/dL — ABNORMAL LOW (ref 8.9–10.3)
Chloride: 101 mmol/L (ref 98–111)
Creatinine, Ser: 0.58 mg/dL (ref 0.44–1.00)
GFR, Estimated: >60 mL/min (ref >60)
Glucose, Bld: 162 mg/dL — ABNORMAL HIGH (ref 70–99)
Potassium: 3.7 mmol/L (ref 3.5–5.1)
Sodium: 136 mmol/L (ref 135–145)

## 2023-03-27 LAB — TROPONIN I (HIGH SENSITIVITY): Troponin I (High Sensitivity): 14 ng/L (ref <18)

## 2023-03-27 MED ORDER — IOHEXOL 350 MG/ML SOLN
75.0000 mL | Freq: Once | INTRAVENOUS | Status: AC | PRN
  Administered 2023-03-27: 75 mL via INTRAVENOUS

## 2023-03-27 MED ORDER — MAGNESIUM SULFATE 2 GM/50ML IV SOLN
2.0000 g | INTRAVENOUS | Status: AC
  Administered 2023-03-27: 2 g via INTRAVENOUS
  Filled 2023-03-27: qty 50

## 2023-03-27 MED ORDER — ALBUTEROL SULFATE (2.5 MG/3ML) 0.083% IN NEBU
2.5000 mg | INHALATION_SOLUTION | RESPIRATORY_TRACT | Status: DC | PRN
  Administered 2023-03-27: 2.5 mg via RESPIRATORY_TRACT
  Filled 2023-03-27 (×2): qty 3

## 2023-03-27 MED ORDER — CYCLOBENZAPRINE HCL 10 MG PO TABS: 10.0000 mg | ORAL_TABLET | Freq: Three times a day (TID) | ORAL | Status: DC | PRN

## 2023-03-27 MED ORDER — AMLODIPINE BESYLATE 10 MG PO TABS
10.0000 mg | ORAL_TABLET | Freq: Every day | ORAL | Status: DC
  Administered 2023-03-27 – 2023-03-28 (×2): 10 mg via ORAL
  Filled 2023-03-27: qty 2
  Filled 2023-03-27: qty 1

## 2023-03-27 MED ORDER — METHYLPREDNISOLONE SODIUM SUCC 125 MG IJ SOLR
125.0000 mg | INTRAMUSCULAR | Status: AC
  Administered 2023-03-27: 125 mg via INTRAVENOUS
  Filled 2023-03-27: qty 2

## 2023-03-27 MED ORDER — METOPROLOL SUCCINATE ER 50 MG PO TB24
100.0000 mg | ORAL_TABLET | Freq: Every day | ORAL | Status: DC
  Administered 2023-03-27 – 2023-03-28 (×2): 100 mg via ORAL
  Filled 2023-03-27 (×2): qty 2

## 2023-03-27 MED ORDER — PNEUMOCOCCAL 20-VAL CONJ VACC 0.5 ML IM SUSY
0.5000 mL | PREFILLED_SYRINGE | INTRAMUSCULAR | Status: DC
  Filled 2023-03-27: qty 0.5

## 2023-03-27 MED ORDER — SODIUM CHLORIDE 0.9 % IV SOLN
1.0000 g | INTRAVENOUS | Status: DC
  Administered 2023-03-27 – 2023-03-28 (×2): 1 g via INTRAVENOUS
  Filled 2023-03-27 (×2): qty 10

## 2023-03-27 MED ORDER — TRAZODONE HCL 50 MG PO TABS
50.0000 mg | ORAL_TABLET | Freq: Every evening | ORAL | Status: DC | PRN
  Administered 2023-03-27: 50 mg via ORAL
  Filled 2023-03-27: qty 1

## 2023-03-27 MED ORDER — NICOTINE 14 MG/24HR TD PT24
14.0000 mg | MEDICATED_PATCH | Freq: Every day | TRANSDERMAL | Status: DC
  Administered 2023-03-27 – 2023-03-28 (×2): 14 mg via TRANSDERMAL
  Filled 2023-03-27 (×2): qty 1

## 2023-03-27 MED ORDER — PREDNISONE 20 MG PO TABS
40.0000 mg | ORAL_TABLET | Freq: Every day | ORAL | Status: DC
  Administered 2023-03-28: 40 mg via ORAL
  Filled 2023-03-27: qty 2

## 2023-03-27 MED ORDER — IPRATROPIUM-ALBUTEROL 0.5-2.5 (3) MG/3ML IN SOLN
3.0000 mL | Freq: Four times a day (QID) | RESPIRATORY_TRACT | Status: DC
  Administered 2023-03-27 – 2023-03-28 (×5): 3 mL via RESPIRATORY_TRACT
  Filled 2023-03-27 (×5): qty 3

## 2023-03-27 MED ORDER — LAMOTRIGINE 100 MG PO TABS: 100.0000 mg | ORAL_TABLET | Freq: Every day | ORAL | Status: DC

## 2023-03-27 MED ORDER — METHYLPREDNISOLONE SODIUM SUCC 40 MG IJ SOLR
40.0000 mg | INTRAMUSCULAR | Status: AC
  Administered 2023-03-27: 40 mg via INTRAVENOUS
  Filled 2023-03-27: qty 1

## 2023-03-27 MED ORDER — BENZONATATE 100 MG PO CAPS
200.0000 mg | ORAL_CAPSULE | Freq: Three times a day (TID) | ORAL | Status: DC | PRN
  Administered 2023-03-27 (×2): 200 mg via ORAL
  Filled 2023-03-27 (×2): qty 2

## 2023-03-27 MED ORDER — PANTOPRAZOLE SODIUM 40 MG PO TBEC
40.0000 mg | DELAYED_RELEASE_TABLET | Freq: Every day | ORAL | Status: DC
  Administered 2023-03-27 – 2023-03-28 (×2): 40 mg via ORAL
  Filled 2023-03-27 (×2): qty 1

## 2023-03-27 MED ORDER — SODIUM CHLORIDE 0.9 % IV SOLN: INTRAVENOUS | Status: DC

## 2023-03-27 MED ORDER — ARIPIPRAZOLE 2 MG PO TABS
4.0000 mg | ORAL_TABLET | Freq: Every evening | ORAL | Status: DC
  Administered 2023-03-27: 4 mg via ORAL
  Filled 2023-03-27 (×3): qty 2

## 2023-03-27 MED ORDER — IPRATROPIUM-ALBUTEROL 0.5-2.5 (3) MG/3ML IN SOLN
6.0000 mL | Freq: Once | RESPIRATORY_TRACT | Status: AC
  Administered 2023-03-27: 6 mL via RESPIRATORY_TRACT
  Filled 2023-03-27: qty 3

## 2023-03-27 MED ORDER — ALBUTEROL SULFATE (2.5 MG/3ML) 0.083% IN NEBU
5.0000 mg | INHALATION_SOLUTION | Freq: Once | RESPIRATORY_TRACT | Status: AC
  Administered 2023-03-27: 5 mg via RESPIRATORY_TRACT
  Filled 2023-03-27: qty 6

## 2023-03-27 MED ORDER — OXYBUTYNIN CHLORIDE ER 5 MG PO TB24
5.0000 mg | ORAL_TABLET | Freq: Every day | ORAL | Status: DC
  Administered 2023-03-27: 5 mg via ORAL
  Filled 2023-03-27: qty 1

## 2023-03-27 NOTE — Assessment & Plan Note (Signed)
Appears stable on imaging Monitor

## 2023-03-27 NOTE — ED Notes (Signed)
Pt ambulatory to restroom

## 2023-03-27 NOTE — H&P (Addendum)
History and Physical    Patient: Carly [name]:[id number] DOB: 24-Nov-1970 DOA: 03/27/2023 DOS: the patient was seen and examined on 03/27/2023 PCP: Dana Allan, MD  Patient coming from: Home  Chief Complaint:  Chief Complaint  Patient presents with   Shortness of Breath   HPI: Carly [name] is a 51 y.o. female with medical history significant of depression, tobacco abuse, asthma/COPD, chronic ITP followed by outpatient oncology, hypertension presenting with COPD exacerbation.  Patient reports having COPD flare diagnosed roughly 1 week ago.  Was placed on course of prednisone, Augmentin as well as inhalers with persistent symptoms.  Shortness of breath and wheezing have acutely worsened over the past day or so.  Patient does admit to smoking throughout the course of the COPD flare.  No fevers or chills.  No nausea or vomiting.  No chest pain.  No hemiparesis or confusion.  Still smoking between 1/2 to 1 pack/day.  Does drink multiple beers 3-4 times a week predominantly on the weekend. Presented to the ER afebrile, hemodynamically stable.  Satting well on room air.  White count 9.1, hemoglobin 16, platelets 39, creatinine 0.58.  Glucose 162.  Troponin 14.  CTA of the chest negative for PE.  Noted 5 mm nodular opacity in the right lower lobe as well as interval resolution of previous*cluster of nodules in the medial aspect of the right lung base. Review of Systems: As mentioned in the history of present illness. All other systems reviewed and are negative. Past Medical History:  Diagnosis Date   Abnormal uterine bleeding (AUB) 08/26/2018   Anxiety    C. difficile diarrhea    07/04/21   Chicken pox    Depression    Depression, major, single episode, moderate (HCC) 08/04/2018   Diarrhea 06/07/2022   Dyspnea on exertion 05/24/2022   Elevated testosterone level in female    Emphysema of lung (HCC)    bronchitis   Frequent headaches    Generalized anxiety disorder 08/23/2014   Hay fever     Hypertension    Idiopathic thrombocytopenic purpura (ITP) (HCC)    Iron deficiency 09/02/2017   Positive ANA (antinuclear antibody)    Thrombocytopenia (HCC) 02/12/2016   Past Surgical History:  Procedure Laterality Date   HEMATOMA EVACUATION N/A 04/07/2020   Procedure: EVACUATION HEMATOMA  AND APPLICATION OF PRESSURE DRESSING;  Surgeon: Christeen Douglas, MD;  Location: [facility] ORS;  Service: Gynecology;  Laterality: N/A;   HYSTEROSCOPY WITH NOVASURE N/A 02/21/2018   Procedure: HYSTEROSCOPY WITH NOVASURE, POLYPECTOMY;  Surgeon: Christeen Douglas, MD;  Location: [facility] ORS;  Service: Gynecology;  Laterality: N/A;   TUBAL LIGATION  1997   TUBAL LIGATION     Social History:  reports that she has been smoking cigarettes. She has a 42 pack-year smoking history. She has never used smokeless tobacco. She reports current alcohol use of about 8.0 standard drinks of alcohol per week. She reports that she does not use drugs.  Allergies  Allergen Reactions   Wellbutrin [Bupropion]     Crazy    Meloxicam Nausea Only    Family History  Problem Relation Age of Onset   Hyperlipidemia Mother    Heart disease Mother    Stroke Mother    Depression Mother    Mental illness Mother    Heart attack Mother 70   Hyperlipidemia Father    Depression Father    Mental illness Father    Diabetes Paternal Grandfather    Cancer Cousin     Prior to  Admission medications   Medication Sig Start Date End Date Taking? Authorizing Provider  acetaminophen (TYLENOL) 500 MG tablet Take 500 mg by mouth every 6 (six) hours as needed.    [provider]  albuterol (PROVENTIL) (2.5 MG/3ML) 0.083% nebulizer solution Take 3 mLs (2.5 mg total) by nebulization every 6 (six) hours as needed for wheezing or shortness of breath. 03/01/22   Salena Saner, MD  albuterol (VENTOLIN HFA) 108 (90 Base) MCG/ACT inhaler INHALE 2 PUFFS INTO THE LUNGS EVERY 6 HOURS AS NEEDED FOR WHEEZING OR SHORTNESS OF BREATH (COUGH) 03/25/23    Salena Saner, MD  amLODipine (NORVASC) 10 MG tablet Take 1 tablet (10 mg total) by mouth daily. 02/11/23   Dana Allan, MD  amoxicillin-clavulanate (AUGMENTIN) 875-125 MG tablet Take 1 tablet by mouth 2 (two) times daily. 03/21/23   [name], PA-C  ARIPiprazole (ABILIFY) 2 MG tablet Take 2 tablets (4 mg total) by mouth daily. Dc rexulti not covered 02/23/23   Dana Allan, MD  benzonatate (TESSALON) 100 MG capsule Take 1-2 capsules (100-200 mg total) by mouth 3 (three) times daily as needed. 03/21/23   [name], PA-C  BREZTRI AEROSPHERE 160-9-4.8 MCG/ACT AERO INHALE 2 PUFFS INTO THE LUNGS IN THE MORNING AND AT BEDTIME. 03/11/23   Salena Saner, MD  Cholecalciferol 1.25 MG (50000 UT) capsule Take 1 capsule (50,000 Units total) by mouth every 30 (thirty) days. 1x per month note this 02/11/23   Dana Allan, MD  cyanocobalamin (VITAMIN B12) 1000 MCG/ML injection INJECT 1 ML (1,000 MCG TOTAL) INTO THE MUSCLE EVERY 30 DAYS. 08/06/22   Dana Allan, MD  cyclobenzaprine (FLEXERIL) 10 MG tablet Take 1 tablet (10 mg total) by mouth 3 (three) times daily as needed for muscle spasms. 01/08/23   Waldon Merl, PA-C  diclofenac Sodium (VOLTAREN) 1 % GEL Apply 2-4 g topically 4 (four) times daily. 2 grams upper body qid prn and 4 gram lower body qid prn 11/03/20   McLean-Scocuzza, Pasty Spillers, MD  escitalopram (LEXAPRO) 20 MG tablet Take 1 tablet (20 mg total) by mouth daily. 02/23/23   Dana Allan, MD  fluticasone (FLONASE) 50 MCG/ACT nasal spray Place 2 sprays into both nostrils daily. 09/04/22   Freddy Finner, NP  lamoTRIgine (LAMICTAL) 100 MG tablet Take 100 mg by mouth daily. Send refills to Dr Rolm Bookbinder MD 01/16/23   [provider]  levocetirizine (XYZAL) 5 MG tablet Take 1 tablet (5 mg total) by mouth every evening. 11/23/21   Waldon Merl, PA-C  metoprolol succinate (TOPROL-XL) 100 MG 24 hr tablet Take 1 tablet (100 mg total) by mouth daily. Take with or immediately  following a meal. 02/11/23 05/12/23  Dana Allan, MD  naproxen (NAPROSYN) 500 MG tablet Take 1 tablet (500 mg total) by mouth 2 (two) times daily with a meal. 01/09/23   Burnette, Alessandra Bevels, PA-C  omeprazole (PRILOSEC) 20 MG capsule Take 1 capsule (20 mg total) by mouth daily. 02/11/23   Dana Allan, MD  oxybutynin (DITROPAN XL) 5 MG 24 hr tablet Take 1 tablet (5 mg total) by mouth at bedtime. 03/20/23   Dana Allan, MD  predniSONE (DELTASONE) 10 MG tablet Days 1-4 take 4 tablets (40 mg) daily  Days 5-8 take 3 tablets (30 mg) daily, Days 9-11 take 2 tablets (20 mg) daily, Days 12-14 take 1 tablet (10 mg) daily. 03/21/23   [name], PA-C  traZODone (DESYREL) 50 MG tablet TAKE 0.5-1 TABLETS BY MOUTH AT  BEDTIME AS NEEDED FOR SLEEP. 02/23/23   Dana Allan, MD    Physical Exam: Vitals:   03/27/23 8295 03/27/23 0625 03/27/23 0639 03/27/23 0700  BP:    (!) 153/87  Pulse: 84 79 80 82  Resp: (!) 23 19 20 16   Temp:      TempSrc:      SpO2: 97% 95% 94% 92%  Weight:      Height:       Physical Exam Constitutional:      Appearance: She is obese.  HENT:     Head: Normocephalic and atraumatic.     Nose: Nose normal.     Mouth/Throat:     Mouth: Mucous membranes are moist.  Eyes:     Conjunctiva/sclera: Conjunctivae normal.     Pupils: Pupils are equal, round, and reactive to light.  Cardiovascular:     Rate and Rhythm: Normal rate and regular rhythm.  Pulmonary:     Effort: Pulmonary effort is normal.     Breath sounds: Wheezing present.  Abdominal:     General: Bowel sounds are normal.  Musculoskeletal:        General: Normal range of motion.  Skin:    General: Skin is warm.  Neurological:     General: No focal deficit present.  Psychiatric:        Mood and Affect: Mood normal.     Data Reviewed:  There are no new results to review at this time. CT Angio Chest PE W and/or Wo Contrast CLINICAL DATA:  Pulmonary embolism (PE) suspected, high prob. Shortness of breath x1  week.  EXAM: CT ANGIOGRAPHY CHEST WITH CONTRAST  TECHNIQUE: Multidetector CT imaging of the chest was performed using the standard protocol during bolus administration of intravenous contrast. Multiplanar CT image reconstructions and MIPs were obtained to evaluate the vascular anatomy.  RADIATION DOSE REDUCTION: This exam was performed according to the departmental dose-optimization program which includes automated exposure control, adjustment of the mA and/or kV according to patient size and/or use of iterative reconstruction technique.  CONTRAST:  75mL OMNIPAQUE IOHEXOL 350 MG/ML SOLN  COMPARISON:  CTA chest 09/09/2022.  FINDINGS: Cardiovascular: Satisfactory opacification of the pulmonary arteries to the segmental level. No evidence of pulmonary embolism. Normal heart size. No pericardial effusion.  Mediastinum/Nodes: No enlarged mediastinal, hilar, or axillary lymph nodes. Thyroid gland, trachea, and esophagus demonstrate no significant findings.  Lungs/Pleura: New 5 mm nodular opacity in the medial aspect of the right lower lobe (axial image 83 series 6), possibly infectious or inflammatory in etiology. Interval resolution of the previously described cluster of nodules in the medial aspect of the right lung base, as well as the pleural-based nodule in the posterior aspect of the right upper lobe. No pleural effusion or pneumothorax.  Upper Abdomen: Hepatic steatosis.  No acute findings.  Musculoskeletal: No chest wall abnormality. No acute or significant osseous findings.  Review of the MIP images confirms the above findings.  IMPRESSION: 1. No evidence of pulmonary embolism. 2. New 5 mm nodular opacity in the medial aspect of the right lower lobe, possibly infectious or inflammatory in etiology. 3. Interval resolution of the previously described cluster of nodules in the medial aspect of the right lung base, as well as the pleural-based nodule in the posterior  aspect of the right upper lobe. 4. Hepatic steatosis.  Electronically Signed   By: Orvan Falconer M.D.   On: 03/27/2023 08:27 DG Chest 2 View CLINICAL DATA:  chest pain, shortness of breath Pt  arrived via POV with c/o shortness of breath x 1 week, pt has been on steroids and antibiotics for 1 week, pt reports using inhaler and nebs without relief. Pt reports dyspnea with exertion and at rest. Hx of COPD and asthma, pt smokes 1PPD  EXAM: CHEST - 2 VIEW  COMPARISON:  CT angiography chest 09/09/2022, chest x-ray 09/08/2022  FINDINGS: The heart and mediastinal contours are unchanged.  No focal consolidation. No pulmonary edema. No pleural effusion. No pneumothorax.  No acute osseous abnormality.  IMPRESSION: No active cardiopulmonary disease.  Electronically Signed   By: Tish Frederickson M.D.   On: 03/27/2023 06:28  Lab Results  Component Value Date   WBC 9.1 03/27/2023   HGB 16.2 (H) 03/27/2023   HCT 47.2 (H) 03/27/2023   MCV 101.1 (H) 03/27/2023   PLT 39 (L) 03/27/2023   Last metabolic panel Lab Results  Component Value Date   GLUCOSE 162 (H) 03/27/2023   NA 136 03/27/2023   K 3.7 03/27/2023   CL 101 03/27/2023   CO2 25 03/27/2023   BUN 13 03/27/2023   CREATININE 0.58 03/27/2023   GFRNONAA >60 03/27/2023   CALCIUM 8.4 (L) 03/27/2023   PHOS 1.8 (L) 09/10/2022   PROT 6.1 11/06/2022   ALBUMIN 3.7 11/06/2022   LABGLOB 3.1 05/04/2021   LABGLOB 2.5 05/04/2021   AGRATIO 1.3 05/04/2021   AGRATIO 1.8 05/04/2021   BILITOT 0.6 11/06/2022   ALKPHOS 123 (H) 11/06/2022   AST 70 (H) 11/06/2022   ALT 37 (H) 11/06/2022   ANIONGAP 10 03/27/2023    Assessment and Plan: COPD exacerbation (HCC) Persistent cough, wheezing, increased sputum production in setting of recent COPD treatment with failed outpatient course Patient still smoking despite symptoms-major confounding issue No hypoxia present CTA of the chest negative for PE Noted nodules which are fairly  stable IV Solu-Medrol IV Rocephin for infectious coverage DuoNebs Discussed smoking cessation at length Monitor  Essential hypertension BP stable Titrate home regimen  Pulmonary nodules Appears stable on imaging Monitor  Mood disorder (HCC) Mood stable Continue home regimen  Gastroesophageal reflux disease without esophagitis PPI  Chronic ITP (idiopathic thrombocytopenic purpura) (HCC) Followed by outpatient oncology On Nplate Baseline platelet ranges between 30 to 60s Platelet count 39 today Hold anticoagulation Hem-onc consult as clinically indicated  Monitor  Tobacco use disorder Half to 1 pack/day smoker Discussed cessation at length in the setting of active COPD flare with persistent use Nicotine patch Monitor      Advance Care Planning:   Code Status: Full Code   Consults: None   Family Communication: No family at the bedside   Severity of Illness: The appropriate patient status for this patient is OBSERVATION. Observation status is judged to be reasonable and necessary in order to provide the required intensity of service to ensure the patient's safety. The patient's presenting symptoms, physical exam findings, and initial radiographic and laboratory data in the context of their medical condition is felt to place them at decreased risk for further clinical deterioration. Furthermore, it is anticipated that the patient will be medically stable for discharge from the hospital within 2 midnights of admission.   Author: Floydene Flock, MD 03/27/2023 9:46 AM  For on call review [email or web address].

## 2023-03-27 NOTE — ED Triage Notes (Signed)
Pt arrived via POV with c/o shortness of breath x 1 week, pt has been on steroids and antibiotics for 1 week, pt reports using inhaler and nebs without relief.  Pt reports dyspnea with exertion and at rest.    Hx of COPD and asthma, pt smokes 1PPD

## 2023-03-27 NOTE — ED Notes (Signed)
Pt ambulatory to the restroom at this time. No assistance needed.  

## 2023-03-27 NOTE — ED Notes (Signed)
Patient to CT. Magnesium infusion paused while patient is at scan per MD [facility].

## 2023-03-27 NOTE — Assessment & Plan Note (Signed)
Persistent cough, wheezing, increased sputum production in setting of recent COPD treatment with failed outpatient course Patient still smoking despite symptoms-major confounding issue No hypoxia present CTA of the chest negative for PE Noted nodules which are fairly stable IV Solu-Medrol IV Rocephin for infectious coverage DuoNebs Discussed smoking cessation at length Monitor

## 2023-03-27 NOTE — Assessment & Plan Note (Addendum)
Followed by outpatient oncology On Nplate Baseline platelet ranges between 30 to 60s Platelet count 39 today Hold anticoagulation Hem-onc consult as clinically indicated  Monitor

## 2023-03-27 NOTE — ED Notes (Signed)
Breathing tx complete.

## 2023-03-27 NOTE — Assessment & Plan Note (Signed)
Mood stable Continue home regimen

## 2023-03-27 NOTE — Assessment & Plan Note (Signed)
BP stable Titrate home regimen 

## 2023-03-27 NOTE — Assessment & Plan Note (Signed)
PPI ?

## 2023-03-27 NOTE — ED Notes (Addendum)
Assumed care of pt at this time. Pt is AAXO4, placed on CCM, VS stable and WNL. Pt reports ongoing SHOB x1 week, was rx'ed abx and steroids and feels that [facility] is getting worse. Pt used home inhaler twice PTA. On room air at baseline. Pt visibly labored breathing. SPO2 on RA is 95%.

## 2023-03-27 NOTE — Assessment & Plan Note (Signed)
Half to 1 pack/day smoker Discussed cessation at length in the setting of active COPD flare with persistent use Nicotine patch Monitor

## 2023-03-27 NOTE — ED Provider Notes (Signed)
[facility] Provider Note    Event Date/Time   First MD Initiated Contact with Patient 03/27/23 0701     (approximate)   History   Chief Complaint: Shortness of Breath   HPI  Carly [name] is a 52 y.o. female with a history of smoking, COPD, obesity, hypertension who comes to the ED complaining of shortness of breath, wheezing, nonproductive cough, dyspnea on exertion.  Also reports pain in the upper back with deep breathing.  Symptoms have been ongoing for a week.  She has been taking a prednisone taper along with her nebulizer treatments and Augmentin without improvement.  No leg swelling or calf pain.  No vomiting or diarrhea.  Normal oral intake.     Physical Exam   Triage Vital Signs: ED Triage Vitals  Encounter Vitals Group     BP 03/27/23 0609 (!) 176/93     Systolic BP Percentile --      Diastolic BP Percentile --      Pulse Rate 03/27/23 0609 87     Resp 03/27/23 0609 (!) 24     Temp 03/27/23 0609 99.8 F (37.7 C)     Temp Source 03/27/23 0609 Oral     SpO2 03/27/23 0609 94 %     Weight 03/27/23 0608 214 lb (97.1 kg)     Height 03/27/23 0608 5\' 2"  (1.575 m)     Head Circumference --      Peak Flow --      Pain Score 03/27/23 0606 4     Pain Loc --      Pain Education --      Exclude from Growth Chart --     Most recent vital signs: Vitals:   03/27/23 0639 03/27/23 0700  BP:  (!) 153/87  Pulse: 80 82  Resp: 20 16  Temp:    SpO2: 94% 92%    General: Awake, no distress.  CV:  Good peripheral perfusion.  Regular rate and rhythm Resp:  Normal effort.  Tachypnea, respiratory rate of 24.  Diffuse expiratory wheezing and prolonged expiratory phase Abd:  No distention.  Soft nontender Other:  No lower extremity edema or calf tenderness   ED Results / Procedures / Treatments   Labs (all labs ordered are listed, but only abnormal results are displayed) Labs Reviewed  BASIC METABOLIC PANEL - Abnormal; Notable for the following  components:      Result Value   Glucose, Bld 162 (*)    Calcium 8.4 (*)    All other components within normal limits  CBC - Abnormal; Notable for the following components:   Hemoglobin 16.2 (*)    HCT 47.2 (*)    MCV 101.1 (*)    MCH 34.7 (*)    Platelets 39 (*)    All other components within normal limits  POC URINE PREG, ED  TROPONIN I (HIGH SENSITIVITY)     EKG Interpreted by me Sinus rhythm rate of 88.  Normal axis and intervals.  Poor R wave progression.  No acute ischemic changes.   RADIOLOGY Chest x-ray interpreted by me, no acute findings.  Radiology report reviewed   PROCEDURES:  Procedures   MEDICATIONS ORDERED IN ED: Medications  ipratropium-albuterol (DUONEB) 0.5-2.5 (3) MG/3ML nebulizer solution 6 mL (6 mLs Nebulization Given 03/27/23 0624)  methylPREDNISolone sodium succinate (SOLU-MEDROL) 125 mg/2 mL injection 125 mg (125 mg Intravenous Given 03/27/23 0736)  magnesium sulfate IVPB 2 g 50 mL (0 g Intravenous Stopped 03/27/23 0840)  albuterol (PROVENTIL) (2.5 MG/3ML) 0.083% nebulizer solution 5 mg (5 mg Nebulization Given 03/27/23 0743)  iohexol (OMNIPAQUE) 350 MG/ML injection 75 mL (75 mLs Intravenous Contrast Given 03/27/23 0805)     IMPRESSION / MDM / ASSESSMENT AND PLAN / ED COURSE  I reviewed the triage vital signs and the nursing notes.  DDx: COPD exacerbation, NSTEMI, pneumonia, pleural effusion, pulm edema, pulmonary embolism, pneumothorax  Patient's presentation is most consistent with acute presentation with potential threat to life or bodily function.  Patient presents with shortness of breath worse with walking week.  Other history and exam features compatible with COPD exacerbation.  With her refractory symptoms, additional testing with labs, CT chest.  Will give additional bronchodilators steroids and IV magnesium.   Clinical Course as of 03/27/23 0916  Wed Mar 27, 2023  0848 Labs unremarkable, troponin negative.  CT angiogram of the chest  negative for PE or other significant findings.  Still symptomatic, will need to hospitalize for COPD exacerbation and outpatient treatment failure. [PS]    Clinical Course User Index [PS] Sharman Cheek, MD     FINAL CLINICAL IMPRESSION(S) / ED DIAGNOSES   Final diagnoses:  COPD exacerbation (HCC)     Rx / DC Orders   ED Discharge Orders     None        Note:  This document was prepared using Dragon voice recognition software and may include unintentional dictation errors.   Sharman Cheek, MD 03/27/23 [id number]

## 2023-03-27 NOTE — ED Notes (Signed)
Patient transported to X-ray 

## 2023-03-27 NOTE — ED Notes (Signed)
Pt given water and TV remote, air turned down, and phone plugged in pt denies other needs.

## 2023-03-28 DIAGNOSIS — J441 Chronic obstructive pulmonary disease with (acute) exacerbation: Secondary | ICD-10-CM | POA: Diagnosis not present

## 2023-03-28 DIAGNOSIS — Z1231 Encounter for screening mammogram for malignant neoplasm of breast: Secondary | ICD-10-CM

## 2023-03-28 DIAGNOSIS — Z716 Tobacco abuse counseling: Secondary | ICD-10-CM | POA: Diagnosis not present

## 2023-03-28 LAB — CBC
HCT: 44 % (ref 36.0–46.0)
Hemoglobin: 15.1 g/dL — ABNORMAL HIGH (ref 12.0–15.0)
MCH: 34.7 pg — ABNORMAL HIGH (ref 26.0–34.0)
MCHC: 34.3 g/dL (ref 30.0–36.0)
MCV: 101.1 fL — ABNORMAL HIGH (ref 80.0–100.0)
Platelets: 32 10*3/uL — ABNORMAL LOW (ref 150–400)
RBC: 4.35 MIL/uL (ref 3.87–5.11)
RDW: 14.6 % (ref 11.5–15.5)
WBC: 9.1 10*3/uL (ref 4.0–10.5)
nRBC: 0 % (ref 0.0–0.2)

## 2023-03-28 LAB — COMPREHENSIVE METABOLIC PANEL
ALT: 58 U/L — ABNORMAL HIGH (ref 0–44)
AST: 82 U/L — ABNORMAL HIGH (ref 15–41)
Albumin: 3.2 g/dL — ABNORMAL LOW (ref 3.5–5.0)
Alkaline Phosphatase: 146 U/L — ABNORMAL HIGH (ref 38–126)
Anion gap: 12 (ref 5–15)
BUN: 9 mg/dL (ref 6–20)
CO2: 24 mmol/L (ref 22–32)
Calcium: 8.1 mg/dL — ABNORMAL LOW (ref 8.9–10.3)
Chloride: 100 mmol/L (ref 98–111)
Creatinine, Ser: 0.5 mg/dL (ref 0.44–1.00)
GFR, Estimated: >60 mL/min (ref >60)
Glucose, Bld: 188 mg/dL — ABNORMAL HIGH (ref 70–99)
Potassium: 4 mmol/L (ref 3.5–5.1)
Sodium: 136 mmol/L (ref 135–145)
Total Bilirubin: 0.9 mg/dL (ref 0.3–1.2)
Total Protein: 6.3 g/dL — ABNORMAL LOW (ref 6.5–8.1)

## 2023-03-28 MED ORDER — AZITHROMYCIN 250 MG PO TABS: ORAL_TABLET | ORAL | 0 refills | Status: AC

## 2023-03-28 MED ORDER — PREDNISONE 20 MG PO TABS: 40.0000 mg | ORAL_TABLET | Freq: Every day | ORAL | 0 refills | Status: AC

## 2023-03-28 MED ORDER — DM-GUAIFENESIN ER 30-600 MG PO TB12
1.0000 | ORAL_TABLET | Freq: Two times a day (BID) | ORAL | Status: DC
  Administered 2023-03-28: 1 via ORAL
  Filled 2023-03-28: qty 1

## 2023-03-28 MED ORDER — VARENICLINE TARTRATE 0.5 MG PO TABS
0.5000 mg | ORAL_TABLET | Freq: Two times a day (BID) | ORAL | 0 refills | Status: AC
Start: 2023-03-28 — End: 2023-04-27

## 2023-03-28 MED ORDER — PSEUDOEPHEDRINE HCL 30 MG PO TABS
30.0000 mg | ORAL_TABLET | Freq: Three times a day (TID) | ORAL | Status: DC
  Filled 2023-03-28: qty 1

## 2023-03-28 MED ORDER — NICOTINE 14 MG/24HR TD PT24: 14.0000 mg | MEDICATED_PATCH | Freq: Every day | TRANSDERMAL | 0 refills | Status: DC

## 2023-03-28 NOTE — Discharge Summary (Signed)
Physician Discharge Summary  Patient: Carly [name] BJY:[id number] DOB: 1970/11/11   Code Status: Full Code Admit date: 03/27/2023 Discharge date: 03/28/2023 Disposition: Home, No home health services recommended PCP: Dana Allan, MD  Recommendations for Outpatient Follow-up:  Follow up with PCP within 1-2 weeks Regarding general hospital follow up and preventative care Recommend    Discharge Diagnoses:  Active Problems:   COPD exacerbation (HCC)   Essential hypertension   Pulmonary nodules   Tobacco use disorder   Chronic ITP (idiopathic thrombocytopenic purpura) (HCC)   Gastroesophageal reflux disease without esophagitis   Mood disorder (HCC)   Encounter for tobacco use cessation counseling  Brief Hospital Course Summary: Carly [name] is a 52 y.o. female with a PMH significant for depression, tobacco dependence, asthma/COPD, chronic ITP .   They presented from home to the ED on 03/27/2023 with COPD exacerbation. Had recently underwent outpatient treatment of same with prednisone and augmentin and inhalers but her symptoms have worsened.    In the ED, it was found that they had afebrile, hemodynamically stable. Satting well on room air.  Significant findings included White count 9.1, hemoglobin 16, platelets 39, creatinine 0.58. Glucose 162. Troponin 14.  CTA of the chest negative for PE. Noted 5 mm nodular opacity in the right lower lobe as well as interval resolution of previous*cluster of nodules in the medial aspect of the right lung base. .   They were initially treated with albuterol nebulizer, duonebs, solumedrol, CTX, magnesium.    Patient was admitted to medicine service for further workup and management of COPD exacerbation as outlined in detail below.   03/28/23 -patient remained stable overnight and has not needed any oxygen supplementation throughout admission. She was ambulating independently without assistance or oxygen desaturations. I provided her with  symptomatic support of sudafed and mucinex which helped to improve her congestion. She was stable for discharge at this time. Prednisone and azithromycin course prescribed to complete COPD exacerbation treatment.  All other chronic conditions were treated with home medications.   Discharge Condition: Good, improved Recommended discharge diet: Regular healthy diet  Consultations: None   Procedures/Studies: None   Allergies as of 03/28/2023       Reactions   Wellbutrin [bupropion]    Crazy   Meloxicam Nausea Only        Medication List     STOP taking these medications    amLODipine 10 MG tablet Commonly known as: NORVASC   amoxicillin-clavulanate 875-125 MG tablet Commonly known as: AUGMENTIN   Cholecalciferol 1.25 MG (50000 UT) capsule   cyclobenzaprine 10 MG tablet Commonly known as: FLEXERIL   lamoTRIgine 100 MG tablet Commonly known as: LAMICTAL   naproxen 500 MG tablet Commonly known as: NAPROSYN       TAKE these medications    acetaminophen 500 MG tablet Commonly known as: TYLENOL Take 500-1,000 mg by mouth every 6 (six) hours as needed for mild pain or headache.   albuterol 108 (90 Base) MCG/ACT inhaler Commonly known as: VENTOLIN HFA INHALE 2 PUFFS INTO THE LUNGS EVERY 6 HOURS AS NEEDED FOR WHEEZING OR SHORTNESS OF BREATH (COUGH)   ALPRAZolam 0.25 MG tablet Commonly known as: XANAX Take 0.25 mg by mouth at bedtime.   ARIPiprazole 2 MG tablet Commonly known as: Abilify Take 2 tablets (4 mg total) by mouth daily. Dc rexulti not covered What changed:  how much to take when to take this additional instructions   azithromycin 250 MG tablet Commonly known as: Zithromax Take  2 tablets (500 mg total) by mouth daily for 1 day, THEN 1 tablet (250 mg total) daily for 4 days. Start taking on: March 28, 2023   benzonatate 100 MG capsule Commonly known as: TESSALON Take 1-2 capsules (100-200 mg total) by mouth 3 (three) times daily as needed.    Breztri Aerosphere 160-9-4.8 MCG/ACT Aero Generic drug: Budeson-Glycopyrrol-Formoterol INHALE 2 PUFFS INTO THE LUNGS IN THE MORNING AND AT BEDTIME.   cyanocobalamin 1000 MCG/ML injection Commonly known as: VITAMIN B12 INJECT 1 ML (1,000 MCG TOTAL) INTO THE MUSCLE EVERY 30 DAYS.   escitalopram 20 MG tablet Commonly known as: LEXAPRO Take 1 tablet (20 mg total) by mouth daily.   metoprolol succinate 100 MG 24 hr tablet Commonly known as: TOPROL-XL Take 1 tablet (100 mg total) by mouth daily. Take with or immediately following a meal.   nicotine 14 mg/24hr patch Commonly known as: NICODERM CQ - dosed in mg/24 hours Place 1 patch (14 mg total) onto the skin daily.   omeprazole 20 MG capsule Commonly known as: PRILOSEC Take 1 capsule (20 mg total) by mouth daily.   oxybutynin 5 MG 24 hr tablet Commonly known as: Ditropan XL Take 1 tablet (5 mg total) by mouth at bedtime.   predniSONE 20 MG tablet Commonly known as: DELTASONE Take 2 tablets (40 mg total) by mouth daily with breakfast for 3 days. Start taking on: March 29, 2023 What changed:  medication strength how much to take how to take this when to take this additional instructions   traZODone 50 MG tablet Commonly known as: DESYREL TAKE 0.5-1 TABLETS BY MOUTH AT BEDTIME AS NEEDED FOR SLEEP.   varenicline 0.5 MG tablet Commonly known as: Chantix Take 1 tablet (0.5 mg total) by mouth 2 (two) times daily.        Follow-up Information     Dana Allan, MD. Schedule an appointment as soon as possible for a visit.   Specialty: Family Medicine Why: As needed, If symptoms worsen Contact information: [address] [place] [address] [phone]                 Subjective   Pt reports feeling well. Has mild residual cough and phlegm. Denies chest pain or SOB at rest. A little more tired after walking but overall feeling well.   All questions and concerns were addressed at time of discharge.   Objective  Blood pressure (!) 153/83, pulse 91, temperature 98.5 F (36.9 C), resp. rate 18, height 5\' 2"  (1.575 m), weight 97.1 kg, SpO2 95%.   General: Pt is alert, awake, not in acute distress Cardiovascular: RRR, S1/S2 +, no rubs, no gallops Respiratory: CTA bilaterally, no wheezing, no rhonchi Abdominal: Soft, NT, ND, bowel sounds + Extremities: no edema, no cyanosis  The results of significant diagnostics from this hospitalization (including imaging, microbiology, ancillary and laboratory) are listed below for reference.   Imaging studies: CT Angio Chest PE W and/or Wo Contrast  Result Date: 03/27/2023 CLINICAL DATA:  Pulmonary embolism (PE) suspected, high prob. Shortness of breath x1 week. EXAM: CT ANGIOGRAPHY CHEST WITH CONTRAST TECHNIQUE: Multidetector CT imaging of the chest was performed using the standard protocol during bolus administration of intravenous contrast. Multiplanar CT image reconstructions and MIPs were obtained to evaluate the vascular anatomy. RADIATION DOSE REDUCTION: This exam was performed according to the departmental dose-optimization program which includes automated exposure control, adjustment of the mA and/or kV according to patient size and/or use of iterative reconstruction technique. CONTRAST:  75mL  OMNIPAQUE IOHEXOL 350 MG/ML SOLN COMPARISON:  CTA chest 09/09/2022. FINDINGS: Cardiovascular: Satisfactory opacification of the pulmonary arteries to the segmental level. No evidence of pulmonary embolism. Normal heart size. No pericardial effusion. Mediastinum/Nodes: No enlarged mediastinal, hilar, or axillary lymph nodes. Thyroid gland, trachea, and esophagus demonstrate no significant findings. Lungs/Pleura: New 5 mm nodular opacity in the medial aspect of the right lower lobe (axial image 83 series 6), possibly infectious or inflammatory in etiology. Interval resolution of the previously described cluster of nodules in the medial aspect of the right lung base,  as well as the pleural-based nodule in the posterior aspect of the right upper lobe. No pleural effusion or pneumothorax. Upper Abdomen: Hepatic steatosis.  No acute findings. Musculoskeletal: No chest wall abnormality. No acute or significant osseous findings. Review of the MIP images confirms the above findings. IMPRESSION: 1. No evidence of pulmonary embolism. 2. New 5 mm nodular opacity in the medial aspect of the right lower lobe, possibly infectious or inflammatory in etiology. 3. Interval resolution of the previously described cluster of nodules in the medial aspect of the right lung base, as well as the pleural-based nodule in the posterior aspect of the right upper lobe. 4. Hepatic steatosis. Electronically Signed   By: Orvan Falconer M.D.   On: 03/27/2023 08:27   DG Chest 2 View  Result Date: 03/27/2023 CLINICAL DATA:  chest pain, shortness of breath Pt arrived via POV with c/o shortness of breath x 1 week, pt has been on steroids and antibiotics for 1 week, pt reports using inhaler and nebs without relief. Pt reports dyspnea with exertion and at rest. Hx of COPD and asthma, pt smokes 1PPD EXAM: CHEST - 2 VIEW COMPARISON:  CT angiography chest 09/09/2022, chest x-ray 09/08/2022 FINDINGS: The heart and mediastinal contours are unchanged. No focal consolidation. No pulmonary edema. No pleural effusion. No pneumothorax. No acute osseous abnormality. IMPRESSION: No active cardiopulmonary disease. Electronically Signed   By: Tish Frederickson M.D.   On: 03/27/2023 06:28    Labs: Basic Metabolic Panel: Recent Labs  Lab 03/27/23 0626 03/28/23 0338  NA 136 136  K 3.7 4.0  CL 101 100  CO2 25 24  GLUCOSE 162* 188*  BUN 13 9  CREATININE 0.58 0.50  CALCIUM 8.4* 8.1*   CBC: Recent Labs  Lab 03/27/23 0626 03/28/23 0338  WBC 9.1 9.1  HGB 16.2* 15.1*  HCT 47.2* 44.0  MCV 101.1* 101.1*  PLT 39* 32*   Microbiology: Results for orders placed or performed during the hospital encounter of  03/27/23  Respiratory (~20 pathogens) panel by PCR     Status: Abnormal   Collection Time: 03/27/23 10:55 AM   Specimen: Nasopharyngeal Swab; Respiratory  Result Value Ref Range Status   Adenovirus NOT DETECTED NOT DETECTED Final   Coronavirus 229E NOT DETECTED NOT DETECTED Final    Comment: (NOTE) The Coronavirus on the Respiratory Panel, DOES NOT test for the novel  Coronavirus (2019 nCoV)    Coronavirus HKU1 NOT DETECTED NOT DETECTED Final   Coronavirus NL63 NOT DETECTED NOT DETECTED Final   Coronavirus OC43 NOT DETECTED NOT DETECTED Final   Metapneumovirus NOT DETECTED NOT DETECTED Final   Rhinovirus / Enterovirus DETECTED (A) NOT DETECTED Final   Influenza A NOT DETECTED NOT DETECTED Final   Influenza B NOT DETECTED NOT DETECTED Final   Parainfluenza Virus 1 NOT DETECTED NOT DETECTED Final   Parainfluenza Virus 2 NOT DETECTED NOT DETECTED Final   Parainfluenza Virus 3 NOT DETECTED NOT DETECTED  Final   Parainfluenza Virus 4 NOT DETECTED NOT DETECTED Final   Respiratory Syncytial Virus NOT DETECTED NOT DETECTED Final   Bordetella pertussis NOT DETECTED NOT DETECTED Final   Bordetella Parapertussis NOT DETECTED NOT DETECTED Final   Chlamydophila pneumoniae NOT DETECTED NOT DETECTED Final   Mycoplasma pneumoniae NOT DETECTED NOT DETECTED Final    Comment: Performed at [facility], [address], [place], [place] [address]    Time coordinating discharge: Over 30 minutes  Leeroy Bock, MD  [facility] 03/28/2023, 11:28 AM

## 2023-03-28 NOTE — Discharge Instructions (Signed)
I'm glad you are taking steps to stop smoking.  I prescribed nicotine patches and chantix. Please read the informational article about chantix. You may experience some nausea/upset stomach when starting the medication. It is recommended that you not consume alcohol while taking this medication.   Please follow up with your primary doctor if your symptoms worsen or do not improve

## 2023-03-28 NOTE — TOC CM/SW Note (Signed)
Transition of Care [facility]) - Inpatient Brief Assessment   Patient Details  Name: Carly [name] MRN: [id number] Date of Birth: 03/09/1971  Transition of [facility]) CM/SW Contact:    Garret Reddish, RN Phone Number: 03/28/2023, 10:43 AM   Clinical Narrative: Chart reviewed.  Noted that patient was admitted with COPD exacerbation.    Patient is currently on IV Solu-medrol, IV Rocephin and Duonebs.  Patient has insurance and PCP.  No TOC needs identified at this time.    Transition of Care Asessment: Insurance and Status: Insurance coverage has been reviewed Patient has primary care physician: Yes Clent Ridges, Kenney Houseman, MD) Home environment has been reviewed: Reviewed Prior level of function:: Independent Prior/Current Home Services: No current home services Social Determinants of Health Reivew: SDOH reviewed no interventions necessary Readmission risk has been reviewed: Yes Transition of care needs: no transition of care needs at this time

## 2023-03-29 ENCOUNTER — Telehealth: Payer: Self-pay

## 2023-03-29 NOTE — Transitions of Care (Post Inpatient/ED Visit) (Unsigned)
03/29/2023  Name: Carly [name] MRN: [id number] DOB: [birth year]  Today's TOC FU Call Status: Today's TOC FU Call Status:: Unsuccessful Call (1st Attempt) Unsuccessful Call (1st Attempt) Date: 03/29/23  Attempted to reach the patient regarding the most recent Inpatient/ED visit.  Follow Up Plan: Additional outreach attempts will be made to reach the patient to complete the Transitions of Care (Post Inpatient/ED visit) call.   Signature Karena Addison, LPN [facility] Nurse Health Advisor Direct Dial [phone]

## 2023-04-02 ENCOUNTER — Ambulatory Visit: Payer: BC Managed Care – PPO | Admitting: Pulmonary Disease

## 2023-04-02 NOTE — Transitions of Care (Post Inpatient/ED Visit) (Signed)
04/02/2023  Name: Carly [name] MRN: [id number] DOB: 08-21-1970  Today's TOC FU Call Status: Today's TOC FU Call Status:: Successful TOC FU Call Completed Unsuccessful Call (1st Attempt) Date: 03/29/23 [facility] FU Call Complete Date: 04/02/23 Patient's Name and Date of Birth confirmed.  Transition Care Management Follow-up Telephone Call Date of Discharge: 03/28/23 Discharge Facility: [facility]) Type of Discharge: Inpatient Admission Primary Inpatient Discharge Diagnosis:: COPD How have you been since you were released from the hospital?: Better  Items Reviewed: Did you receive and understand the discharge instructions provided?: Yes Medications obtained,verified, and reconciled?: Yes (Medications Reviewed) Any new allergies since your discharge?: No Dietary orders reviewed?: Yes Do you have support at home?: Yes People in Home: spouse  Medications Reviewed Today: Medications Reviewed Today     Reviewed by Karena Addison, LPN (Licensed Practical Nurse) on 04/02/23 at 1556  Med List Status: <None>   Medication Order Taking? Sig Documenting Provider Last Dose Status Informant  acetaminophen (TYLENOL) 500 MG tablet [id number] No Take 500-1,000 mg by mouth every 6 (six) hours as needed for mild pain or headache. [provider] Unknown PRN Active Self  albuterol (VENTOLIN HFA) 108 (90 Base) MCG/ACT inhaler [id number] No INHALE 2 PUFFS INTO THE LUNGS EVERY 6 HOURS AS NEEDED FOR WHEEZING OR SHORTNESS OF BREATH (COUGH) Salena Saner, MD Unknown PRN Active Self, Pharmacy Records  ALPRAZolam Prudy Feeler) 0.25 MG tablet [id number] No Take 0.25 mg by mouth at bedtime. [provider] 03/26/2023 2000 Active Self  ARIPiprazole (ABILIFY) 2 MG tablet [id number] No Take 2 tablets (4 mg total) by mouth daily. Dc rexulti not covered  Patient taking differently: Take 2 mg by mouth 2 (two) times daily.   Dana Allan, MD 03/26/2023 2000 Active Self, Pharmacy  Records  azithromycin (ZITHROMAX) 250 MG tablet [id number]  Take 2 tablets (500 mg total) by mouth daily for 1 day, THEN 1 tablet (250 mg total) daily for 4 days. Leeroy Bock, MD  Active   benzonatate (TESSALON) 100 MG capsule [id number] No Take 1-2 capsules (100-200 mg total) by mouth 3 (three) times daily as needed. [name], PA-C Unknown PRN Active Self, Pharmacy Records  BREZTRI AEROSPHERE 160-9-4.8 MCG/ACT AERO [id number] No INHALE 2 PUFFS INTO THE LUNGS IN THE MORNING AND AT BEDTIME. Salena Saner, MD 03/26/2023 2000 Active Self, Pharmacy Records  cyanocobalamin (VITAMIN B12) 1000 MCG/ML injection [id number] No INJECT 1 ML (1,000 MCG TOTAL) INTO THE MUSCLE EVERY 30 DAYS. Dana Allan, MD Taking Active Self, Pharmacy Records  escitalopram (LEXAPRO) 20 MG tablet [id number] No Take 1 tablet (20 mg total) by mouth daily. Dana Allan, MD 03/26/2023 0800 Active Self, Pharmacy Records  metoprolol succinate (TOPROL-XL) 100 MG 24 hr tablet [id number] No Take 1 tablet (100 mg total) by mouth daily. Take with or immediately following a meal. Dana Allan, MD 03/26/2023 0800 Active Self, Pharmacy Records  nicotine (NICODERM CQ - DOSED IN MG/24 HOURS) 14 mg/24hr patch [id number]  Place 1 patch (14 mg total) onto the skin daily. Leeroy Bock, MD  Active   omeprazole (PRILOSEC) 20 MG capsule [id number] No Take 1 capsule (20 mg total) by mouth daily. Dana Allan, MD 03/26/2023 0800 Active Self, Pharmacy Records  oxybutynin (DITROPAN XL) 5 MG 24 hr tablet [id number] No Take 1 tablet (5 mg total) by mouth at bedtime. Dana Allan, MD 03/26/2023 2000 Active Self, Pharmacy Records  traZODone (DESYREL) 50 MG tablet [id number] No TAKE 0.5-1 TABLETS BY MOUTH AT BEDTIME AS NEEDED  FOR SLEEP. Dana Allan, MD Unknown PRN Active Self, Pharmacy Records  varenicline (CHANTIX) 0.5 MG tablet [id number]  Take 1 tablet (0.5 mg total) by mouth 2 (two) times daily. Leeroy Bock, MD  Active              Home Care and Equipment/Supplies: Were Home Health Services Ordered?: NA Any new equipment or medical supplies ordered?: NA  Functional Questionnaire: Do you need assistance with bathing/showering or dressing?: No Do you need assistance with meal preparation?: No Do you need assistance with eating?: No Do you have difficulty maintaining continence: No Do you need assistance with getting out of bed/getting out of a chair/moving?: No Do you have difficulty managing or taking your medications?: No  Follow up appointments reviewed: PCP Follow-up appointment confirmed?: Yes Date of PCP follow-up appointment?: 04/08/23 Follow-up Provider: [facility] Follow-up appointment confirmed?: Yes Date of Specialist follow-up appointment?: 06/17/23 Follow-Up Specialty Provider:: Pulmo Do you need transportation to your follow-up appointment?: No Do you understand care options if your condition(s) worsen?: Yes-patient verbalized understanding    SIGNATURE Karena Addison, LPN [facility] Nurse Health Advisor Direct Dial [phone]

## 2023-04-03 ENCOUNTER — Inpatient Hospital Stay: Payer: BC Managed Care – PPO | Admitting: Family Medicine

## 2023-04-03 ENCOUNTER — Encounter: Payer: Self-pay | Admitting: Family Medicine

## 2023-04-03 DIAGNOSIS — R399 Unspecified symptoms and signs involving the genitourinary system: Secondary | ICD-10-CM | POA: Insufficient documentation

## 2023-04-03 DIAGNOSIS — N393 Stress incontinence (female) (male): Secondary | ICD-10-CM | POA: Insufficient documentation

## 2023-04-03 NOTE — Assessment & Plan Note (Signed)
Urine negative for infection Trial Oxybutynin XL 5 mg at night If no improvement in symptoms can refer to urology

## 2023-04-03 NOTE — Assessment & Plan Note (Signed)
Urine negative for infection.

## 2023-04-08 ENCOUNTER — Ambulatory Visit (INDEPENDENT_AMBULATORY_CARE_PROVIDER_SITE_OTHER): Payer: BC Managed Care – PPO | Admitting: Family Medicine

## 2023-04-08 ENCOUNTER — Encounter: Payer: Self-pay | Admitting: Family Medicine

## 2023-04-08 DIAGNOSIS — F39 Unspecified mood [affective] disorder: Secondary | ICD-10-CM

## 2023-04-08 DIAGNOSIS — F17200 Nicotine dependence, unspecified, uncomplicated: Secondary | ICD-10-CM | POA: Diagnosis not present

## 2023-04-08 DIAGNOSIS — J4489 Other specified chronic obstructive pulmonary disease: Secondary | ICD-10-CM

## 2023-04-08 NOTE — Patient Instructions (Addendum)
It was a pleasure meeting you today. Thank you for allowing me to take part in your health care.  Our goals for today as we discussed include:  Glad you are feeling a little better with breathing It may take a little time for you to recoup from your hospital admission and stress on your body  Ensure you are well hydrated and eat healthy  Continue Chantix and follow up with psychiatry to discuss when to restart your Lamictal.   Follow up with pulmonology as scheduled    PAP due.  Please schedule appointment at earliest convenience Cologuard is due  Referral sent for Mammogram. Please call to schedule appointment. [facility] [address] [place], [place] [address] [phone]     Follow up as needed  If you have any questions or concerns, please do not hesitate to call the office at [phone].  I look forward to our next visit and until then take care and stay safe.  Regards,   Dana Allan, MD   [facility]

## 2023-04-08 NOTE — Progress Notes (Signed)
SUBJECTIVE:   Chief Complaint  Patient presents with   Hospitalization Follow-up    COPD    HPI Patient presents for Hospital follow up  Recently admitted to [facility] for COPD exacerbation due to Rhinovirus.  CTA negative for PE.  She was discharged on 09/12 with Z pack and Prednisone.  Reports having completed course of prescribed medications and been off 5-6 days now.  Breathing has improved and feels back to baseline.  Has not had to use rescue inhaler.  Was started on Chantix during hospitalization and doing well.    Mood disorder Lamictal was discontinued at hospital discharge as patient was started on Chantix.  She was advised to follow up with psychiatry and reports calling psychiatrist and was told would discuss medications at next visit.    PERTINENT PMH / PSH: As above  OBJECTIVE:  BP 136/70   Pulse 100   Temp 98 F (36.7 C)   Resp 16   Ht 5\' 2"  (1.575 m)   Wt 212 lb (96.2 kg)   SpO2 97%   BMI 38.78 kg/m    Physical Exam Vitals reviewed.  Constitutional:      General: She is not in acute distress.    Appearance: Normal appearance. She is obese. She is not ill-appearing, toxic-appearing or diaphoretic.  Eyes:     General:        Right eye: No discharge.        Left eye: No discharge.     Conjunctiva/sclera: Conjunctivae normal.  Cardiovascular:     Rate and Rhythm: Normal rate and regular rhythm.     Heart sounds: Normal heart sounds.  Pulmonary:     Effort: Pulmonary effort is normal. No respiratory distress.     Breath sounds: Wheezing present.  Abdominal:     General: Bowel sounds are normal.  Musculoskeletal:        General: Normal range of motion.  Skin:    General: Skin is warm and dry.  Neurological:     General: No focal deficit present.     Mental Status: She is alert and oriented to person, place, and time. Mental status is at baseline.  Psychiatric:        Mood and Affect: Mood normal.        Behavior: Behavior normal.        Thought  Content: Thought content normal.        Judgment: Judgment normal.        04/08/2023   11:05 AM 03/20/2023    1:37 PM 02/11/2023    9:32 AM 08/06/2022    2:56 PM 05/24/2022   10:03 AM  Depression screen PHQ 2/9  Decreased Interest 0 0 0 0 1  Down, Depressed, Hopeless 0 0 0 0 1  PHQ - 2 Score 0 0 0 0 2  Altered sleeping 0 0 0 1 2  Tired, decreased energy 3 2 0 0 2  Change in appetite 0 0 2 0 3  Feeling bad or failure about yourself  0 0 0 0 0  Trouble concentrating 0 0 0 1 1  Moving slowly or fidgety/restless 0 0 0 0 0  Suicidal thoughts 0 0 0 0 0  PHQ-9 Score 3 2 2 2 10   Difficult doing work/chores Not difficult at all Not difficult at all Not difficult at all Not difficult at all Somewhat difficult      04/08/2023   11:06 AM 03/20/2023    1:37 PM 02/11/2023  9:32 AM 08/06/2022    2:57 PM  GAD 7 : Generalized Anxiety Score  Nervous, Anxious, on Edge 0 0 0 1  Control/stop worrying 0 0 0 1  Worry too much - different things 0 0 0 1  Trouble relaxing 0 0 0 1  Restless 0 0 0 1  Easily annoyed or irritable 0 0 0 2  Afraid - awful might happen 0 0 0 1  Total GAD 7 Score 0 0 0 8  Anxiety Difficulty Not difficult at all Not difficult at all Not difficult at all Somewhat difficult    ASSESSMENT/PLAN:  Asthma-COPD overlap syndrome Assessment & Plan: Recent exacerbation and required hospitalization Has improved and using less rescue therapy Chantix initiated during hospitalization  Continue Breztri  Follow up with Pulmonology as scheduled     Tobacco use disorder Assessment & Plan: Now on Chantix to help with smoking cessation.  Doing well and limiting cigarette use   Mood disorder [facility]) Assessment & Plan: Chronic Doing well on current medications Follow up with psychiatry to discuss continuing Lamictal given recent initiation of Chantix during hospitalization    PDMP reviewed  Return if symptoms worsen or fail to improve, for PCP.  Dana Allan, MD

## 2023-04-15 ENCOUNTER — Encounter: Payer: Self-pay | Admitting: Family Medicine

## 2023-04-15 NOTE — Assessment & Plan Note (Signed)
Now on Chantix to help with smoking cessation.  Doing well and limiting cigarette use

## 2023-04-15 NOTE — Assessment & Plan Note (Addendum)
Recent exacerbation and required hospitalization Has improved and using less rescue therapy Chantix initiated during hospitalization  Continue Breztri  Follow up with Pulmonology as scheduled

## 2023-04-15 NOTE — Assessment & Plan Note (Signed)
Chronic Doing well on current medications Follow up with psychiatry to discuss continuing Lamictal given recent initiation of Chantix during hospitalization

## 2023-04-16 ENCOUNTER — Other Ambulatory Visit: Payer: Self-pay | Admitting: Family Medicine

## 2023-04-16 DIAGNOSIS — N393 Stress incontinence (female) (male): Secondary | ICD-10-CM

## 2023-04-17 ENCOUNTER — Ambulatory Visit: Payer: BC Managed Care – PPO | Attending: Pulmonary Disease

## 2023-04-17 DIAGNOSIS — R0609 Other forms of dyspnea: Secondary | ICD-10-CM | POA: Diagnosis not present

## 2023-04-17 LAB — ECHOCARDIOGRAM COMPLETE
Area-P 1/2: 3.6 cm2
S' Lateral: 2.3 cm

## 2023-04-17 MED ORDER — PERFLUTREN LIPID MICROSPHERE
1.0000 mL | INTRAVENOUS | Status: AC | PRN
Start: 2023-04-17 — End: 2023-04-17
  Administered 2023-04-17: 3 mL via INTRAVENOUS

## 2023-04-29 ENCOUNTER — Telehealth: Payer: BC Managed Care – PPO | Admitting: Physician Assistant

## 2023-04-29 DIAGNOSIS — B029 Zoster without complications: Secondary | ICD-10-CM

## 2023-04-29 MED ORDER — VALACYCLOVIR HCL 1 G PO TABS
1000.0000 mg | ORAL_TABLET | Freq: Three times a day (TID) | ORAL | 0 refills | Status: AC
Start: 2023-04-29 — End: 2023-05-06

## 2023-04-29 NOTE — Progress Notes (Signed)
E-visit for Shingles   We are sorry that you are not feeling well. Here is how we plan to help!  Based on what you shared with me it looks like you have shingles.  Shingles or herpes zoster, is a common infection of the nerves.  It is a painful rash caused by the herpes zoster virus.  This is the same virus that causes chickenpox.  After a person has chickenpox, the virus remains inactive in the nerve cells.  Years later, the virus can become active again and travel to the skin.  It typically will appear on one side of the face or body.  Burning or shooting pain, tingling, or itching are early signs of the infection.  Blisters typically scab over in 7 to 10 days and clear up within 2-4 weeks. Shingles is only contagious to people that have never had the chickenpox, the chickenpox vaccine, or anyone who has a compromised immune system.  You should avoid contact with these type of people until your blisters scab over.  I have prescribed Valacyclovir 1g three times daily for 7 days    HOME CARE: Apply ice packs (wrapped in a thin towel), cool compresses, or soak in cool bath to help reduce pain. Use calamine lotion to calm itchy skin. Avoid scratching the rash. Avoid direct sunlight.  GET HELP RIGHT AWAY IF: Symptoms that don't away after treatment. A rash or blisters near your eye. Increased drainage, fever, or rash after treatment. Severe pain that doesn't go away.   MAKE SURE YOU   Understand these instructions. Will watch your condition. Will get help right away if you are not doing well or get worse.  Thank you for choosing an e-visit.  Your e-visit answers were reviewed by a board certified advanced clinical practitioner to complete your personal care plan. Depending upon the condition, your plan could have included both over the counter or prescription medications.  Please review your pharmacy choice. Make sure the pharmacy is open so you can pick up prescription now. If there  is a problem, you may contact your provider through [employer] and have the prescription routed to another pharmacy.  Your safety is important to [place]. If you have drug allergies check your prescription carefully.   For the next 24 hours you can use MyChart to ask questions about today's visit, request a non-urgent call back, or ask for a work or school excuse. You will get an email in the next two days asking about your experience. I hope that your e-visit has been valuable and will speed your recovery.   I have spent 5 minutes in review of e-visit questionnaire, review and updating patient chart, medical decision making and response to patient.   [name], PA-C

## 2023-05-02 ENCOUNTER — Inpatient Hospital Stay: Payer: BC Managed Care – PPO

## 2023-05-02 ENCOUNTER — Inpatient Hospital Stay: Payer: BC Managed Care – PPO | Admitting: Oncology

## 2023-05-02 ENCOUNTER — Inpatient Hospital Stay: Payer: BC Managed Care – PPO | Attending: Oncology

## 2023-05-13 ENCOUNTER — Inpatient Hospital Stay (HOSPITAL_BASED_OUTPATIENT_CLINIC_OR_DEPARTMENT_OTHER): Payer: BC Managed Care – PPO | Admitting: Oncology

## 2023-05-13 ENCOUNTER — Inpatient Hospital Stay: Payer: BC Managed Care – PPO | Attending: Oncology

## 2023-05-13 ENCOUNTER — Inpatient Hospital Stay: Payer: BC Managed Care – PPO

## 2023-05-13 VITALS — BP 140/82 | HR 102 | Temp 98.6°F | Wt 219.0 lb

## 2023-05-13 DIAGNOSIS — D693 Immune thrombocytopenic purpura: Secondary | ICD-10-CM | POA: Diagnosis present

## 2023-05-13 LAB — CBC WITH DIFFERENTIAL/PLATELET
Abs Immature Granulocytes: 0.03 10*3/uL (ref 0.00–0.07)
Basophils Absolute: 0.1 10*3/uL (ref 0.0–0.1)
Basophils Relative: 1 %
Eosinophils Absolute: 0.3 10*3/uL (ref 0.0–0.5)
Eosinophils Relative: 3 %
HCT: 44.7 % (ref 36.0–46.0)
Hemoglobin: 14.8 g/dL (ref 12.0–15.0)
Immature Granulocytes: 0 %
Lymphocytes Relative: 20 %
Lymphs Abs: 1.8 10*3/uL (ref 0.7–4.0)
MCH: 33.4 pg (ref 26.0–34.0)
MCHC: 33.1 g/dL (ref 30.0–36.0)
MCV: 100.9 fL — ABNORMAL HIGH (ref 80.0–100.0)
Monocytes Absolute: 0.9 10*3/uL (ref 0.1–1.0)
Monocytes Relative: 10 %
Neutro Abs: 5.7 10*3/uL (ref 1.7–7.7)
Neutrophils Relative %: 66 %
Platelets: 52 10*3/uL — ABNORMAL LOW (ref 150–400)
RBC: 4.43 MIL/uL (ref 3.87–5.11)
RDW: 14.6 % (ref 11.5–15.5)
WBC: 8.8 10*3/uL (ref 4.0–10.5)
nRBC: 0 % (ref 0.0–0.2)

## 2023-05-13 MED ORDER — ROMIPLOSTIM INJECTION 500 MCG
6.0000 ug/kg | Freq: Once | SUBCUTANEOUS | Status: AC
  Administered 2023-05-13: 595 ug via SUBCUTANEOUS
  Filled 2023-05-13: qty 1.19
  Filled 2023-05-13: qty 0.19

## 2023-05-13 NOTE — Progress Notes (Signed)
Patient is feeling tired and fatigued.

## 2023-05-13 NOTE — Progress Notes (Signed)
CREATININE 0.50 03/28/2023   CALCIUM 8.1 (L) 03/28/2023   PROT 6.3 (L) 03/28/2023   ALBUMIN 3.2 (L) 03/28/2023   AST 82 (H) 03/28/2023   ALT 58 (H) 03/28/2023   ALKPHOS 146 (H) 03/28/2023   BILITOT 0.9 03/28/2023   GFRNONAA >60 03/28/2023   GFRAA >60 04/08/2020    Lab Results  Component Value Date   WBC 8.8 05/13/2023   NEUTROABS 5.7 05/13/2023   HGB 14.8 05/13/2023   HCT 44.7 05/13/2023   MCV 100.9 (H) 05/13/2023   PLT 52 (L) 05/13/2023     STUDIES: ECHOCARDIOGRAM COMPLETE  Result Date: 04/17/2023    ECHOCARDIOGRAM REPORT   Patient Name:   Carly [name] Date of Exam: 04/17/2023 Medical Rec #:  [id number]      Height:       62.0 in Accession #:    [id number]     Weight:       212.0 lb Date of Birth:  12/21/1970       BSA:          1.960 m Patient Age:    52 years       BP:           136/70 mmHg Patient Gender: F              HR:           80 bpm. Exam Location:  [facility] Procedure: 2D Echo, Cardiac Doppler, Color Doppler and Intracardiac            Opacification Agent Indications:    R06.9 DOE  History:        Patient has no prior history of Echocardiogram examinations.                 COPD,  Arrythmias:Tachycardia, Signs/Symptoms:Dyspnea and                 Shortness of Breath; Risk Factors:Hypertension, Dyslipidemia and                 Current Smoker.  Sonographer:    Ilda Mori MHA, BS, RDCS Referring Phys: [id number] CARMEN L GONZALEZ  Sonographer Comments: Technically difficult study due to poor echo windows, patient is obese and suboptimal apical window. Image acquisition challenging due to patient body habitus and Image acquisition challenging due to COPD. IMPRESSIONS  1. Left ventricular ejection fraction, by estimation, is 60 to 65%. The left ventricle has normal function. The left ventricle has no regional wall motion abnormalities. There is mild left ventricular hypertrophy. Left ventricular diastolic parameters were normal.  2. Right ventricular systolic function is normal. The right ventricular size is normal. There is normal pulmonary artery systolic pressure. The estimated right ventricular systolic pressure is 27.5 mmHg.  3. Left atrial size was mildly dilated.  4. The mitral valve is normal in structure. No evidence of mitral valve regurgitation. No evidence of mitral stenosis.  5. The aortic valve is tricuspid. Aortic valve regurgitation is not visualized. No aortic stenosis is present.  6. The inferior vena cava is normal in size with greater than 50% respiratory variability, suggesting right atrial pressure of 3 mmHg. FINDINGS  Left Ventricle: Left ventricular ejection fraction, by estimation, is 60 to 65%. The left ventricle has normal function. The left ventricle has no regional wall motion abnormalities. Definity contrast agent was given IV to delineate the left ventricular  endocardial borders. The left ventricular internal cavity size was normal in size. There is mild left ventricular hypertrophy. Left ventricular  [facility]  Telephone:([phone] Fax:([phone]  ID: [name] OB: 12/13/1970  MR#: [id number]  AOZ#:[id number]  Patient Care Team: Dana Allan, MD as PCP - General (Family Medicine) Jeralyn Ruths, MD as Consulting Physician (Oncology) Salena Saner, MD as Consulting Physician (Pulmonary Disease)   CHIEF COMPLAINT: ITP.  INTERVAL HISTORY: Patient returns to clinic today for repeat laboratory work, further evaluation, and continuation of Nplate.  She was admitted to the hospital approximately 1 month ago with a COPD exacerbation.  She currently feels well and is back to her baseline. She does not complain of any increased bleeding or bruising today. She has no neurologic complaints. She has a good appetite and denies weight loss.  She denies any chest pain, shortness of breath, cough, or hemoptysis. She denies any nausea, vomiting, constipation, or diarrhea.  She has no urinary complaints.  Patient offers no further specific complaints today.  REVIEW OF SYSTEMS:   Review of Systems  Constitutional: Negative.  Negative for fever, malaise/fatigue and weight loss.  Respiratory: Negative.  Negative for cough, hemoptysis and shortness of breath.   Cardiovascular: Negative.  Negative for chest pain and leg swelling.  Gastrointestinal: Negative.  Negative for abdominal pain, blood in stool and melena.  Genitourinary: Negative.  Negative for hematuria.  Musculoskeletal: Negative.  Negative for back pain.  Skin: Negative.  Negative for rash.  Neurological: Negative.  Negative for dizziness, focal weakness, weakness and headaches.  Endo/Heme/Allergies: Negative.  Does not bruise/bleed easily.  Psychiatric/Behavioral: Negative.  The patient is not nervous/anxious.     As per HPI. Otherwise, a complete review of systems is negative.  PAST MEDICAL HISTORY: Past Medical History:  Diagnosis Date   Abnormal uterine bleeding (AUB) 08/26/2018   Anxiety     C. difficile diarrhea    07/04/21   Chicken pox    Depression    Depression, major, single episode, moderate (HCC) 08/04/2018   Diarrhea 06/07/2022   Dyspnea on exertion 05/24/2022   Elevated testosterone level in female    Emphysema of lung (HCC)    bronchitis   Frequent headaches    Generalized anxiety disorder 08/23/2014   Hay fever    Hypertension    Idiopathic thrombocytopenic purpura (ITP) (HCC)    Iron deficiency 09/02/2017   Positive ANA (antinuclear antibody)    Thrombocytopenia (HCC) 02/12/2016    PAST SURGICAL HISTORY: Past Surgical History:  Procedure Laterality Date   HEMATOMA EVACUATION N/A 04/07/2020   Procedure: EVACUATION HEMATOMA  AND APPLICATION OF PRESSURE DRESSING;  Surgeon: Christeen Douglas, MD;  Location: [facility];  Service: Gynecology;  Laterality: N/A;   HYSTEROSCOPY WITH NOVASURE N/A 02/21/2018   Procedure: HYSTEROSCOPY WITH NOVASURE, POLYPECTOMY;  Surgeon: Christeen Douglas, MD;  Location: [facility];  Service: Gynecology;  Laterality: N/A;   TUBAL LIGATION  1997   TUBAL LIGATION      FAMILY HISTORY: Family History  Problem Relation Age of Onset   Hyperlipidemia Mother    Heart disease Mother    Stroke Mother    Depression Mother    Mental illness Mother    Heart attack Mother 69   Hyperlipidemia Father    Depression Father    Mental illness Father    Diabetes Paternal Grandfather    Cancer Cousin     ADVANCED DIRECTIVES (Y/N):  N  HEALTH MAINTENANCE: Social History   Tobacco Use   Smoking status: Every Day    Current packs/day: 1.50    Average packs/day: 1.5 packs/day for  CREATININE 0.50 03/28/2023   CALCIUM 8.1 (L) 03/28/2023   PROT 6.3 (L) 03/28/2023   ALBUMIN 3.2 (L) 03/28/2023   AST 82 (H) 03/28/2023   ALT 58 (H) 03/28/2023   ALKPHOS 146 (H) 03/28/2023   BILITOT 0.9 03/28/2023   GFRNONAA >60 03/28/2023   GFRAA >60 04/08/2020    Lab Results  Component Value Date   WBC 8.8 05/13/2023   NEUTROABS 5.7 05/13/2023   HGB 14.8 05/13/2023   HCT 44.7 05/13/2023   MCV 100.9 (H) 05/13/2023   PLT 52 (L) 05/13/2023     STUDIES: ECHOCARDIOGRAM COMPLETE  Result Date: 04/17/2023    ECHOCARDIOGRAM REPORT   Patient Name:   Carly [name] Date of Exam: 04/17/2023 Medical Rec #:  [id number]      Height:       62.0 in Accession #:    [id number]     Weight:       212.0 lb Date of Birth:  12/21/1970       BSA:          1.960 m Patient Age:    52 years       BP:           136/70 mmHg Patient Gender: F              HR:           80 bpm. Exam Location:  [facility] Procedure: 2D Echo, Cardiac Doppler, Color Doppler and Intracardiac            Opacification Agent Indications:    R06.9 DOE  History:        Patient has no prior history of Echocardiogram examinations.                 COPD,  Arrythmias:Tachycardia, Signs/Symptoms:Dyspnea and                 Shortness of Breath; Risk Factors:Hypertension, Dyslipidemia and                 Current Smoker.  Sonographer:    Ilda Mori MHA, BS, RDCS Referring Phys: [id number] CARMEN L GONZALEZ  Sonographer Comments: Technically difficult study due to poor echo windows, patient is obese and suboptimal apical window. Image acquisition challenging due to patient body habitus and Image acquisition challenging due to COPD. IMPRESSIONS  1. Left ventricular ejection fraction, by estimation, is 60 to 65%. The left ventricle has normal function. The left ventricle has no regional wall motion abnormalities. There is mild left ventricular hypertrophy. Left ventricular diastolic parameters were normal.  2. Right ventricular systolic function is normal. The right ventricular size is normal. There is normal pulmonary artery systolic pressure. The estimated right ventricular systolic pressure is 27.5 mmHg.  3. Left atrial size was mildly dilated.  4. The mitral valve is normal in structure. No evidence of mitral valve regurgitation. No evidence of mitral stenosis.  5. The aortic valve is tricuspid. Aortic valve regurgitation is not visualized. No aortic stenosis is present.  6. The inferior vena cava is normal in size with greater than 50% respiratory variability, suggesting right atrial pressure of 3 mmHg. FINDINGS  Left Ventricle: Left ventricular ejection fraction, by estimation, is 60 to 65%. The left ventricle has normal function. The left ventricle has no regional wall motion abnormalities. Definity contrast agent was given IV to delineate the left ventricular  endocardial borders. The left ventricular internal cavity size was normal in size. There is mild left ventricular hypertrophy. Left ventricular  [facility]  Telephone:(336) [phone] Fax:([phone]) [phone]  ID: [name] OB: 12/13/1970  MR#: [id number]  AOZ#:[id number]  Patient Care Team: Dana Allan, MD as PCP - General (Family Medicine) Jeralyn Ruths, MD as Consulting Physician (Oncology) Salena Saner, MD as Consulting Physician (Pulmonary Disease)   CHIEF COMPLAINT: ITP.  INTERVAL HISTORY: Patient returns to clinic today for repeat laboratory work, further evaluation, and continuation of Nplate.  She was admitted to the hospital approximately 1 month ago with a COPD exacerbation.  She currently feels well and is back to her baseline. She does not complain of any increased bleeding or bruising today. She has no neurologic complaints. She has a good appetite and denies weight loss.  She denies any chest pain, shortness of breath, cough, or hemoptysis. She denies any nausea, vomiting, constipation, or diarrhea.  She has no urinary complaints.  Patient offers no further specific complaints today.  REVIEW OF SYSTEMS:   Review of Systems  Constitutional: Negative.  Negative for fever, malaise/fatigue and weight loss.  Respiratory: Negative.  Negative for cough, hemoptysis and shortness of breath.   Cardiovascular: Negative.  Negative for chest pain and leg swelling.  Gastrointestinal: Negative.  Negative for abdominal pain, blood in stool and melena.  Genitourinary: Negative.  Negative for hematuria.  Musculoskeletal: Negative.  Negative for back pain.  Skin: Negative.  Negative for rash.  Neurological: Negative.  Negative for dizziness, focal weakness, weakness and headaches.  Endo/Heme/Allergies: Negative.  Does not bruise/bleed easily.  Psychiatric/Behavioral: Negative.  The patient is not nervous/anxious.     As per HPI. Otherwise, a complete review of systems is negative.  PAST MEDICAL HISTORY: Past Medical History:  Diagnosis Date   Abnormal uterine bleeding (AUB) 08/26/2018   Anxiety     C. difficile diarrhea    07/04/21   Chicken pox    Depression    Depression, major, single episode, moderate (HCC) 08/04/2018   Diarrhea 06/07/2022   Dyspnea on exertion 05/24/2022   Elevated testosterone level in female    Emphysema of lung (HCC)    bronchitis   Frequent headaches    Generalized anxiety disorder 08/23/2014   Hay fever    Hypertension    Idiopathic thrombocytopenic purpura (ITP) (HCC)    Iron deficiency 09/02/2017   Positive ANA (antinuclear antibody)    Thrombocytopenia (HCC) 02/12/2016    PAST SURGICAL HISTORY: Past Surgical History:  Procedure Laterality Date   HEMATOMA EVACUATION N/A 04/07/2020   Procedure: EVACUATION HEMATOMA  AND APPLICATION OF PRESSURE DRESSING;  Surgeon: Christeen Douglas, MD;  Location: [facility];  Service: Gynecology;  Laterality: N/A;   HYSTEROSCOPY WITH NOVASURE N/A 02/21/2018   Procedure: HYSTEROSCOPY WITH NOVASURE, POLYPECTOMY;  Surgeon: Christeen Douglas, MD;  Location: [facility];  Service: Gynecology;  Laterality: N/A;   TUBAL LIGATION  1997   TUBAL LIGATION      FAMILY HISTORY: Family History  Problem Relation Age of Onset   Hyperlipidemia Mother    Heart disease Mother    Stroke Mother    Depression Mother    Mental illness Mother    Heart attack Mother 69   Hyperlipidemia Father    Depression Father    Mental illness Father    Diabetes Paternal Grandfather    Cancer Cousin     ADVANCED DIRECTIVES (Y/N):  N  HEALTH MAINTENANCE: Social History   Tobacco Use   Smoking status: Every Day    Current packs/day: 1.50    Average packs/day: 1.5 packs/day for  CREATININE 0.50 03/28/2023   CALCIUM 8.1 (L) 03/28/2023   PROT 6.3 (L) 03/28/2023   ALBUMIN 3.2 (L) 03/28/2023   AST 82 (H) 03/28/2023   ALT 58 (H) 03/28/2023   ALKPHOS 146 (H) 03/28/2023   BILITOT 0.9 03/28/2023   GFRNONAA >60 03/28/2023   GFRAA >60 04/08/2020    Lab Results  Component Value Date   WBC 8.8 05/13/2023   NEUTROABS 5.7 05/13/2023   HGB 14.8 05/13/2023   HCT 44.7 05/13/2023   MCV 100.9 (H) 05/13/2023   PLT 52 (L) 05/13/2023     STUDIES: ECHOCARDIOGRAM COMPLETE  Result Date: 04/17/2023    ECHOCARDIOGRAM REPORT   Patient Name:   Carly [name] Date of Exam: 04/17/2023 Medical Rec #:  [id number]      Height:       62.0 in Accession #:    [id number]     Weight:       212.0 lb Date of Birth:  12/21/1970       BSA:          1.960 m Patient Age:    52 years       BP:           136/70 mmHg Patient Gender: F              HR:           80 bpm. Exam Location:  [facility] Procedure: 2D Echo, Cardiac Doppler, Color Doppler and Intracardiac            Opacification Agent Indications:    R06.9 DOE  History:        Patient has no prior history of Echocardiogram examinations.                 COPD,  Arrythmias:Tachycardia, Signs/Symptoms:Dyspnea and                 Shortness of Breath; Risk Factors:Hypertension, Dyslipidemia and                 Current Smoker.  Sonographer:    Ilda Mori MHA, BS, RDCS Referring Phys: [id number] CARMEN L GONZALEZ  Sonographer Comments: Technically difficult study due to poor echo windows, patient is obese and suboptimal apical window. Image acquisition challenging due to patient body habitus and Image acquisition challenging due to COPD. IMPRESSIONS  1. Left ventricular ejection fraction, by estimation, is 60 to 65%. The left ventricle has normal function. The left ventricle has no regional wall motion abnormalities. There is mild left ventricular hypertrophy. Left ventricular diastolic parameters were normal.  2. Right ventricular systolic function is normal. The right ventricular size is normal. There is normal pulmonary artery systolic pressure. The estimated right ventricular systolic pressure is 27.5 mmHg.  3. Left atrial size was mildly dilated.  4. The mitral valve is normal in structure. No evidence of mitral valve regurgitation. No evidence of mitral stenosis.  5. The aortic valve is tricuspid. Aortic valve regurgitation is not visualized. No aortic stenosis is present.  6. The inferior vena cava is normal in size with greater than 50% respiratory variability, suggesting right atrial pressure of 3 mmHg. FINDINGS  Left Ventricle: Left ventricular ejection fraction, by estimation, is 60 to 65%. The left ventricle has normal function. The left ventricle has no regional wall motion abnormalities. Definity contrast agent was given IV to delineate the left ventricular  endocardial borders. The left ventricular internal cavity size was normal in size. There is mild left ventricular hypertrophy. Left ventricular

## 2023-05-21 ENCOUNTER — Other Ambulatory Visit: Payer: Self-pay | Admitting: Family Medicine

## 2023-05-21 DIAGNOSIS — I10 Essential (primary) hypertension: Secondary | ICD-10-CM

## 2023-05-22 ENCOUNTER — Other Ambulatory Visit: Payer: Self-pay | Admitting: Family Medicine

## 2023-05-22 DIAGNOSIS — N393 Stress incontinence (female) (male): Secondary | ICD-10-CM

## 2023-06-24 ENCOUNTER — Telehealth: Payer: BC Managed Care – PPO | Admitting: Family Medicine

## 2023-06-24 DIAGNOSIS — J069 Acute upper respiratory infection, unspecified: Secondary | ICD-10-CM | POA: Diagnosis not present

## 2023-06-24 MED ORDER — BENZONATATE 100 MG PO CAPS: 100.0000 mg | ORAL_CAPSULE | Freq: Three times a day (TID) | ORAL | 0 refills | Status: DC | PRN

## 2023-06-24 NOTE — Progress Notes (Signed)
E-Visit for Cough  We are sorry that you are not feeling well.  Here is how we plan to help!  Based on your presentation I believe you most likely have A cough due to a virus.  This is called viral bronchitis and is best treated by rest, plenty of fluids and control of the cough.  You may use Ibuprofen or Tylenol as directed to help your symptoms.     In addition you may use A prescription cough medication called Tessalon Perles 100mg. You may take 1-2 capsules every 8 hours as needed for your cough.   From your responses in the eVisit questionnaire you describe inflammation in the upper respiratory tract which is causing a significant cough.  This is commonly called Bronchitis and has four common causes:   Allergies Viral Infections Acid Reflux Bacterial Infection Allergies, viruses and acid reflux are treated by controlling symptoms or eliminating the cause. An example might be a cough caused by taking certain blood pressure medications. You stop the cough by changing the medication. Another example might be a cough caused by acid reflux. Controlling the reflux helps control the cough.  USE OF BRONCHODILATOR ("RESCUE") INHALERS: There is a risk from using your bronchodilator too frequently.  The risk is that over-reliance on a medication which only relaxes the muscles surrounding the breathing tubes can reduce the effectiveness of medications prescribed to reduce swelling and congestion of the tubes themselves.  Although you feel brief relief from the bronchodilator inhaler, your asthma may actually be worsening with the tubes becoming more swollen and filled with mucus.  This can delay other crucial treatments, such as oral steroid medications. If you need to use a bronchodilator inhaler daily, several times per day, you should discuss this with your provider.  There are probably better treatments that could be used to keep your asthma under control.     HOME CARE Only take medications as  instructed by your medical team. Complete the entire course of an antibiotic. Drink plenty of fluids and get plenty of rest. Avoid close contacts especially the very young and the elderly Cover your mouth if you cough or cough into your sleeve. Always remember to wash your hands A steam or ultrasonic humidifier can help congestion.   GET HELP RIGHT AWAY IF: You develop worsening fever. You become short of breath You cough up blood. Your symptoms persist after you have completed your treatment plan MAKE SURE YOU  Understand these instructions. Will watch your condition. Will get help right away if you are not doing well or get worse.    Thank you for choosing an e-visit.  Your e-visit answers were reviewed by a board certified advanced clinical practitioner to complete your personal care plan. Depending upon the condition, your plan could have included both over the counter or prescription medications.  Please review your pharmacy choice. Make sure the pharmacy is open so you can pick up prescription now. If there is a problem, you may contact your provider through MyChart messaging and have the prescription routed to another pharmacy.  Your safety is important to us. If you have drug allergies check your prescription carefully.   For the next 24 hours you can use MyChart to ask questions about today's visit, request a non-urgent call back, or ask for a work or school excuse. You will get an email in the next two days asking about your experience. I hope that your e-visit has been valuable and will speed your recovery.    I provided 5 minutes of non face-to-face time during this encounter for chart review, medication and order placement, as well as and documentation.   . 

## 2023-07-01 ENCOUNTER — Inpatient Hospital Stay: Payer: BC Managed Care – PPO

## 2023-07-01 ENCOUNTER — Inpatient Hospital Stay: Payer: BC Managed Care – PPO | Admitting: Oncology

## 2023-07-03 ENCOUNTER — Encounter: Payer: Self-pay | Admitting: Pulmonary Disease

## 2023-07-03 ENCOUNTER — Ambulatory Visit: Payer: BC Managed Care – PPO | Admitting: Pulmonary Disease

## 2023-07-03 VITALS — BP 122/82 | HR 89 | Temp 97.1°F | Ht 62.0 in | Wt 223.6 lb

## 2023-07-03 DIAGNOSIS — R911 Solitary pulmonary nodule: Secondary | ICD-10-CM

## 2023-07-03 DIAGNOSIS — J4489 Other specified chronic obstructive pulmonary disease: Secondary | ICD-10-CM

## 2023-07-03 DIAGNOSIS — J84115 Respiratory bronchiolitis interstitial lung disease: Secondary | ICD-10-CM | POA: Diagnosis not present

## 2023-07-03 DIAGNOSIS — R0609 Other forms of dyspnea: Secondary | ICD-10-CM | POA: Diagnosis not present

## 2023-07-03 DIAGNOSIS — F17210 Nicotine dependence, cigarettes, uncomplicated: Secondary | ICD-10-CM

## 2023-07-03 NOTE — Patient Instructions (Signed)
VISIT SUMMARY:  During today's visit, we discussed your ongoing management of asthma-COPD overlap syndrome, respiratory bronchiolitis-interstitial lung disease (RBILD), and the importance of smoking cessation. We reviewed your recent CT scan results and addressed your increased use of the albuterol inhaler. We also talked about general health maintenance, including vaccinations.  YOUR PLAN:  -ASTHMA-COPD OVERLAP SYNDROME (ACOS): Asthma-COPD Overlap Syndrome (ACOS) is a chronic condition that combines features of both asthma and chronic obstructive pulmonary disease (COPD). You had a recent exacerbation in September due to a coronavirus infection. Continue using Breztri as prescribed and use your albuterol inhaler as needed. Monitor your symptoms and inhaler use, especially with weather changes.  -RESPIRATORY BRONCHIOLITIS-INTERSTITIAL LUNG DISEASE (RBILD): Respiratory Bronchiolitis-Interstitial Lung Disease (RBILD) is a chronic lung inflammation often caused by smoking. Your recent CT scan showed stable findings with a small spot likely related to a recent infection. We will repeat the CT scan in March. It's important to stop smoking to improve your lung health, and we will prescribe Chantix to help you quit.  -SMOKING CESSATION: Smoking cessation is crucial for improving your lung health and managing your respiratory conditions. You currently smoke one pack per day. We will prescribe Chantix with a starter pack and two additional packs to help you quit. Remember, quitting smoking is essential for reducing lung inflammation and improving your overall health.  -GENERAL HEALTH MAINTENANCE: For your general health, it is recommended that you receive the flu and shingles vaccines. These vaccines will help protect you from common infections and maintain your overall health.  INSTRUCTIONS:  Please schedule a follow-up visit in three months. Additionally, we will repeat your CT scan without contrast in  March.

## 2023-07-03 NOTE — Progress Notes (Signed)
Subjective:    Patient ID: Carly [name], female    DOB: 1970/08/19, 52 y.o.   MRN: [id number]  Patient Care Team: Dana Allan, MD as PCP - General (Family Medicine) Jeralyn Ruths, MD as Consulting Physician (Oncology) Salena Saner, MD as Consulting Physician (Pulmonary Disease)  Chief Complaint  Patient presents with   Follow-up    DOE. Wheezing. Cough with clear sputum.    BACKGROUND/INTERVAL: Carly [name] is a 52 year old current smoker (1 PPD) with a history of asthma/COPD overlap, RB ILD and ITP.  She has not been seen since 24 May 2022.  Since that visit she has had a recent COPD exacerbation as noted below.  HPI Discussed the use of AI scribe software for clinical note transcription with the patient, who gave verbal consent to proceed.  History of Present Illness   The patient, diagnosed with asthma, COPD overlap, and RBILD, continues to smoke one pack of cigarettes per day. She had an ED visit in September due to a COPD exacerbation.  Viral studies were negative.  She spent 1 night in the hospital for observation.  Post-hospitalization, she had a brief period of smoking cessation with the aid of Chantix, but relapsed after the medication ran out.  She reports that her grandkids have been sick, suggesting potential exposure to respiratory infections. She is currently on Breztri for her lung condition and has been using her albuterol inhaler twice daily due to the change in weather.  A recent CT scan did not show significant changes from previous scans. However, a small 5 mm opacity was noted on the right lower lobe, likely related to the recent infection.  This will need short-term follow-up.  She denies any recent fevers, chills, or changes in sputum color. Occasionally, she coughs up clear sputum. On examination, a slight wheeze was noted on the right side, which was considered an improvement from her usual condition.      DATA 05/23/2021 PFTs: FEV1 1.60 L or 60%  predicted, FVC 2.32 L or 69% predicted, FEV1/FVC 69%. Lung volumes show hyperinflation and air trapping there is a small airways component with improvement postbronchodilator.  Diffusion capacity is mildly reduced. 06/14/2021 CT Chest:mild emphysema, 3 mm right middle lobe nodule, 2 mm right upper lobe nodule mild diffuse groundglass opacity and micronodularity likely related to respiratory bronchiolitis/respiratory bronchiolitis interstitial lung disease  Review of Systems A 10 point review of systems was performed and it is as noted above otherwise negative.   Patient Active Problem List   Diagnosis Date Noted   Stress incontinence 04/03/2023   UTI symptoms 04/03/2023   Encounter for tobacco use cessation counseling 03/28/2023   Hypokalemia 09/09/2022   Hyponatremia 09/09/2022   COVID-19 09/09/2022   Tachycardia 09/09/2022   Pulmonary nodules 09/09/2022   Prolonged QT interval 09/09/2022   COPD exacerbation (HCC) 09/08/2022   Emphysema lung (HCC) 08/08/2022   Aortic atherosclerosis (HCC) 08/08/2022   Mood disorder (HCC) 08/08/2022   Colon cancer screening 08/08/2022   Breast cancer screening by mammogram 08/08/2022   Abnormal glucose 08/08/2022   Anxiety and depression 03/21/2022   Allergic rhinitis 11/24/2021   Lumbar radiculopathy 07/11/2021   Gastroesophageal reflux disease without esophagitis 07/01/2020   Hyperlipidemia 07/01/2020   Fatty liver 12/24/2019   Angiolipoma of left kidney 12/24/2019   Chronic ITP (idiopathic thrombocytopenic purpura) (HCC) 12/02/2019   Plantar fasciitis, bilateral 09/29/2019   Osteoarthritis of both knees 09/29/2019   Overactive bladder 07/14/2019   B12 deficiency 06/10/2019   Vitamin D  deficiency 06/10/2019   Insomnia 08/15/2016   Thrombocytopenia (HCC) 02/12/2016   Carpal tunnel syndrome 01/20/2016   Morbid obesity (HCC) 01/04/2016   Essential hypertension 01/04/2016   Asthma-COPD overlap syndrome (HCC) 01/13/2015   Female hirsutism  09/22/2014   Elevated testosterone level in female 09/22/2014   Tobacco use disorder 08/23/2014   Obesity (BMI 30-39.9) 08/23/2014   HTN (hypertension) 08/23/2014    Social History   Tobacco Use   Smoking status: Every Day    Current packs/day: 1.50    Average packs/day: 1.5 packs/day for 28.0 years (42.0 ttl pk-yrs)    Types: Cigarettes   Smokeless tobacco: Never   Tobacco comments:    1 PPD khj 07/03/2023  Substance Use Topics   Alcohol use: Yes    Alcohol/week: 8.0 standard drinks of alcohol    Types: 8 Standard drinks or equivalent per week    Comment: Beer (Can)     Allergies  Allergen Reactions   Wellbutrin [Bupropion]     Crazy    Meloxicam Nausea Only    Current Meds  Medication Sig   acetaminophen (TYLENOL) 500 MG tablet Take 500-1,000 mg by mouth every 6 (six) hours as needed for mild pain or headache.   albuterol (VENTOLIN HFA) 108 (90 Base) MCG/ACT inhaler INHALE 2 PUFFS INTO THE LUNGS EVERY 6 HOURS AS NEEDED FOR WHEEZING OR SHORTNESS OF BREATH (COUGH)   ALPRAZolam (XANAX) 0.25 MG tablet Take 0.25 mg by mouth at bedtime.   amLODipine (NORVASC) 10 MG tablet Take 10 mg by mouth daily.   ARIPiprazole (ABILIFY) 2 MG tablet Take 2 tablets (4 mg total) by mouth daily. Dc rexulti not covered (Patient taking differently: Take 2 mg by mouth 2 (two) times daily.)   benzonatate (TESSALON) 100 MG capsule Take 1-2 capsules (100-200 mg total) by mouth 3 (three) times daily as needed.   BREZTRI AEROSPHERE 160-9-4.8 MCG/ACT AERO INHALE 2 PUFFS INTO THE LUNGS IN THE MORNING AND AT BEDTIME.   cyanocobalamin (VITAMIN B12) 1000 MCG/ML injection INJECT 1 ML (1,000 MCG TOTAL) INTO THE MUSCLE EVERY 30 DAYS.   D3-50 1.25 MG (50000 UT) capsule Take 50,000 Units by mouth once a week.   escitalopram (LEXAPRO) 20 MG tablet Take 1 tablet (20 mg total) by mouth daily.   lamoTRIgine (LAMICTAL) 25 MG tablet Take 25 mg by mouth daily.   metoprolol succinate (TOPROL-XL) 100 MG 24 hr tablet  TAKE 1 TABLET BY MOUTH DAILY. TAKE WITH OR IMMEDIATELY FOLLOWING A MEAL.   nicotine (NICODERM CQ - DOSED IN MG/24 HOURS) 14 mg/24hr patch Place 1 patch (14 mg total) onto the skin daily.   omeprazole (PRILOSEC) 20 MG capsule Take 1 capsule (20 mg total) by mouth daily.   oxybutynin (DITROPAN-XL) 5 MG 24 hr tablet TAKE 1 TABLET BY MOUTH EVERYDAY AT BEDTIME   traZODone (DESYREL) 50 MG tablet TAKE 0.5-1 TABLETS BY MOUTH AT BEDTIME AS NEEDED FOR SLEEP.   [START ON 07/31/2023] varenicline (CHANTIX CONTINUING MONTH PAK) 1 MG tablet Take 1 tablet (1 mg total) by mouth 2 (two) times daily.   Varenicline Tartrate, Starter, (CHANTIX STARTING MONTH PAK) 0.5 MG X 11 & 1 MG X 42 TBPK Take as directed in the package that this is a starter pack.    Immunization History  Administered Date(s) Administered   Influenza,inj,Quad PF,6+ Mos 08/23/2014   Influenza-Unspecified 04/16/2019, 04/26/2021, 04/23/2022   Tdap 08/23/2014        Objective:     BP 122/82 (BP Location: Right Arm, Cuff  Size: Large)   Pulse 89   Temp (!) 97.1 F (36.2 C)   Ht 5\' 2"  (1.575 m)   Wt 223 lb 9.6 oz (101.4 kg)   SpO2 97%   BMI 40.90 kg/m   SpO2: 97 % O2 Device: None (Room air)  GENERAL: Awake, well-developed, no acute distress, fully ambulatory.  Overweight.  No conversational dyspnea. HEAD: Normocephalic, atraumatic.  EYES: Pupils equal, round, reactive to light.  No scleral icterus.  MOUTH: Very poor dentition, missing teeth, oral mucosa moist.  No thrush.Marland Kitchen NECK: Supple. No thyromegaly. Trachea midline. No JVD.  No adenopathy. PULMONARY: Good air entry bilaterally.  Coarse breath sounds bilaterally with faint end expiratory wheezes noted on right lung base otherwise, no adventitious sounds.   CARDIOVASCULAR: S1 and S2. Regular rate and rhythm.  No rubs, murmurs or gallops heard. ABDOMEN: Benign.   MUSCULOSKELETAL: No joint deformity, no clubbing, no edema.  NEUROLOGIC: No focal deficit, speech is fluent, no gait  disturbance. SKIN: Intact,warm,dry.  Ecchymoses and petechiae upper extremities. PSYCH: Normal mood and behavior.  Representative image from the CT performed 27 March 2023 showing 5 mm opacity on the right lower lobe which is new:    Assessment & Plan:     ICD-10-CM   1. Asthma-COPD overlap syndrome (HCC)  J44.89     2. Dyspnea on exertion  R06.09     3. Respiratory bronchiolitis associated interstitial lung disease (HCC)  J84.115     4. Lung nodule - RLL 5 mm  R91.1 CT CHEST WO CONTRAST   Will repeat CT chest without contrast in March      Orders Placed This Encounter  Procedures   CT CHEST WO CONTRAST    Standing Status:   Future    Expiration Date:   07/02/2024    Scheduling Instructions:     6 months from CT in Sept.    Preferred imaging location?:   [facility]    Is patient pregnant?:   No    Meds ordered this encounter  Medications   Varenicline Tartrate, Starter, (CHANTIX STARTING MONTH PAK) 0.5 MG X 11 & 1 MG X 42 TBPK    Sig: Take as directed in the package that this is a starter pack.    Dispense:  42 each    Refill:  0   varenicline (CHANTIX CONTINUING MONTH PAK) 1 MG tablet    Sig: Take 1 tablet (1 mg total) by mouth 2 (two) times daily.    Dispense:  60 tablet    Refill:  1    Continuing therapy after starter package completed.   Discussion:    Asthma-COPD Overlap Syndrome (ACOS) Chronic condition with recent exacerbation in September due to unknown triggering factor but confounded by cigarette smoking. Managed with Markus Daft and albuterol inhaler. Increased albuterol use, especially with weather changes, suggests suboptimal control. - Continue Breztri as prescribed - Use albuterol inhaler as needed - Monitor symptoms and inhaler use  Respiratory Bronchiolitis-Interstitial Lung Disease (RBILD) Chronic lung inflammation attributed to smoking. Recent CT scan showed stable findings with a new small opacity in the right lower lobe likely related  to a recent infection. Improvement expected with smoking cessation. - Repeat CT scan without contrast in March - Encourage smoking cessation - Prescribe Chantix for smoking cessation  Smoking Cessation Smokes one pack per day. Previous Chantix use was initially successful but relapsed after medication ran out. Smoking exacerbates lung inflammation and respiratory conditions. Discussed the importance of cessation for lung health. -  Prescribe Chantix with starter pack and two additional packs - Encourage smoking cessation and discuss its importance for lung health  General Health Maintenance Has not received flu vaccine. PCP recommended flu and shingles vaccines. - Encourage flu vaccine - Encourage shingles vaccine  Follow-up - Schedule follow-up visit in three months.      Advised if symptoms do not improve or worsen, to please contact office for sooner follow up or seek emergency care.    I spent 42 minutes minutes of dedicated to the care of this patient on the date of this encounter to include pre-visit review of records, face-to-face time with the patient discussing conditions above, post visit ordering of testing, clinical documentation with the electronic health record, making appropriate referrals as documented, and communicating necessary findings to members of the patients care team.     C. Danice Goltz, MD Advanced Bronchoscopy PCCM [facility]    *This note was generated using voice recognition software/Dragon and/or AI transcription program.  Despite best efforts to proofread, errors can occur which can change the meaning. Any transcriptional errors that result from this process are unintentional and may not be fully corrected at the time of dictation.

## 2023-07-04 ENCOUNTER — Encounter: Payer: Self-pay | Admitting: Pulmonary Disease

## 2023-07-04 MED ORDER — VARENICLINE TARTRATE (STARTER) 0.5 MG X 11 & 1 MG X 42 PO TBPK: ORAL_TABLET | ORAL | 0 refills | Status: DC

## 2023-07-04 MED ORDER — VARENICLINE TARTRATE 1 MG PO TABS: 1.0000 mg | ORAL_TABLET | Freq: Two times a day (BID) | ORAL | 1 refills | Status: DC

## 2023-07-04 NOTE — Telephone Encounter (Signed)
Has been sent.

## 2023-07-07 ENCOUNTER — Telehealth: Payer: BC Managed Care – PPO | Admitting: Family Medicine

## 2023-07-07 DIAGNOSIS — J4489 Other specified chronic obstructive pulmonary disease: Secondary | ICD-10-CM | POA: Diagnosis not present

## 2023-07-07 MED ORDER — PREDNISONE 20 MG PO TABS: 20.0000 mg | ORAL_TABLET | Freq: Two times a day (BID) | ORAL | 0 refills | Status: AC

## 2023-07-07 MED ORDER — ALBUTEROL SULFATE HFA 108 (90 BASE) MCG/ACT IN AERS
2.0000 | INHALATION_SPRAY | Freq: Four times a day (QID) | RESPIRATORY_TRACT | 0 refills | Status: DC | PRN
Start: 2023-07-07 — End: 2023-10-01

## 2023-07-07 NOTE — Progress Notes (Signed)

## 2023-07-11 ENCOUNTER — Inpatient Hospital Stay: Payer: BC Managed Care – PPO

## 2023-07-11 ENCOUNTER — Inpatient Hospital Stay: Payer: BC Managed Care – PPO | Admitting: Oncology

## 2023-07-15 ENCOUNTER — Inpatient Hospital Stay: Payer: BC Managed Care – PPO

## 2023-07-15 ENCOUNTER — Inpatient Hospital Stay: Payer: BC Managed Care – PPO | Admitting: Oncology

## 2023-07-15 ENCOUNTER — Inpatient Hospital Stay: Payer: BC Managed Care – PPO | Attending: Oncology

## 2023-07-16 ENCOUNTER — Emergency Department: Payer: BC Managed Care – PPO

## 2023-07-16 ENCOUNTER — Other Ambulatory Visit: Payer: Self-pay

## 2023-07-16 DIAGNOSIS — R739 Hyperglycemia, unspecified: Secondary | ICD-10-CM | POA: Diagnosis present

## 2023-07-16 DIAGNOSIS — Z818 Family history of other mental and behavioral disorders: Secondary | ICD-10-CM

## 2023-07-16 DIAGNOSIS — Z833 Family history of diabetes mellitus: Secondary | ICD-10-CM

## 2023-07-16 DIAGNOSIS — Z823 Family history of stroke: Secondary | ICD-10-CM

## 2023-07-16 DIAGNOSIS — E785 Hyperlipidemia, unspecified: Secondary | ICD-10-CM | POA: Diagnosis present

## 2023-07-16 DIAGNOSIS — Z79899 Other long term (current) drug therapy: Secondary | ICD-10-CM

## 2023-07-16 DIAGNOSIS — J449 Chronic obstructive pulmonary disease, unspecified: Secondary | ICD-10-CM | POA: Diagnosis not present

## 2023-07-16 DIAGNOSIS — E876 Hypokalemia: Secondary | ICD-10-CM | POA: Diagnosis present

## 2023-07-16 DIAGNOSIS — J45901 Unspecified asthma with (acute) exacerbation: Secondary | ICD-10-CM | POA: Diagnosis present

## 2023-07-16 DIAGNOSIS — Z8249 Family history of ischemic heart disease and other diseases of the circulatory system: Secondary | ICD-10-CM

## 2023-07-16 DIAGNOSIS — F17210 Nicotine dependence, cigarettes, uncomplicated: Secondary | ICD-10-CM | POA: Diagnosis present

## 2023-07-16 DIAGNOSIS — J441 Chronic obstructive pulmonary disease with (acute) exacerbation: Principal | ICD-10-CM | POA: Diagnosis present

## 2023-07-16 DIAGNOSIS — D693 Immune thrombocytopenic purpura: Secondary | ICD-10-CM | POA: Diagnosis present

## 2023-07-16 DIAGNOSIS — F419 Anxiety disorder, unspecified: Secondary | ICD-10-CM | POA: Diagnosis present

## 2023-07-16 DIAGNOSIS — I10 Essential (primary) hypertension: Secondary | ICD-10-CM | POA: Diagnosis present

## 2023-07-16 DIAGNOSIS — J439 Emphysema, unspecified: Secondary | ICD-10-CM | POA: Diagnosis present

## 2023-07-16 DIAGNOSIS — F32A Depression, unspecified: Secondary | ICD-10-CM | POA: Diagnosis present

## 2023-07-16 DIAGNOSIS — Z6841 Body Mass Index (BMI) 40.0 and over, adult: Secondary | ICD-10-CM

## 2023-07-16 DIAGNOSIS — Z83438 Family history of other disorder of lipoprotein metabolism and other lipidemia: Secondary | ICD-10-CM

## 2023-07-16 DIAGNOSIS — Z7951 Long term (current) use of inhaled steroids: Secondary | ICD-10-CM

## 2023-07-16 DIAGNOSIS — F411 Generalized anxiety disorder: Secondary | ICD-10-CM | POA: Diagnosis present

## 2023-07-16 LAB — BASIC METABOLIC PANEL
Anion gap: 15 (ref 5–15)
BUN: 10 mg/dL (ref 6–20)
CO2: 23 mmol/L (ref 22–32)
Calcium: 8.8 mg/dL — ABNORMAL LOW (ref 8.9–10.3)
Chloride: 95 mmol/L — ABNORMAL LOW (ref 98–111)
Creatinine, Ser: 0.45 mg/dL (ref 0.44–1.00)
GFR, Estimated: >60 mL/min (ref >60)
Glucose, Bld: 197 mg/dL — ABNORMAL HIGH (ref 70–99)
Potassium: 3.6 mmol/L (ref 3.5–5.1)
Sodium: 133 mmol/L — ABNORMAL LOW (ref 135–145)

## 2023-07-16 LAB — RESP PANEL BY RT-PCR (RSV, FLU A&B, COVID)  RVPGX2
Influenza A by PCR: NEGATIVE
Influenza B by PCR: NEGATIVE
Resp Syncytial Virus by PCR: NEGATIVE
SARS Coronavirus 2 by RT PCR: NEGATIVE

## 2023-07-16 LAB — CBC WITH DIFFERENTIAL/PLATELET
Abs Immature Granulocytes: 0.07 10*3/uL (ref 0.00–0.07)
Basophils Absolute: 0.1 10*3/uL (ref 0.0–0.1)
Basophils Relative: 1 %
Eosinophils Absolute: 0.1 10*3/uL (ref 0.0–0.5)
Eosinophils Relative: 1 %
HCT: 43.3 % (ref 36.0–46.0)
Hemoglobin: 14.7 g/dL (ref 12.0–15.0)
Immature Granulocytes: 1 %
Lymphocytes Relative: 18 %
Lymphs Abs: 2 10*3/uL (ref 0.7–4.0)
MCH: 31.7 pg (ref 26.0–34.0)
MCHC: 33.9 g/dL (ref 30.0–36.0)
MCV: 93.3 fL (ref 80.0–100.0)
Monocytes Absolute: 1.1 10*3/uL — ABNORMAL HIGH (ref 0.1–1.0)
Monocytes Relative: 9 %
Neutro Abs: 8.3 10*3/uL — ABNORMAL HIGH (ref 1.7–7.7)
Neutrophils Relative %: 70 %
Platelets: 35 10*3/uL — ABNORMAL LOW (ref 150–400)
RBC: 4.64 MIL/uL (ref 3.87–5.11)
RDW: 14.9 % (ref 11.5–15.5)
WBC: 11.6 10*3/uL — ABNORMAL HIGH (ref 4.0–10.5)
nRBC: 0 % (ref 0.0–0.2)

## 2023-07-16 LAB — TROPONIN I (HIGH SENSITIVITY): Troponin I (High Sensitivity): 19 ng/L — ABNORMAL HIGH (ref <18)

## 2023-07-16 MED ORDER — DEXAMETHASONE SODIUM PHOSPHATE 10 MG/ML IJ SOLN
10.0000 mg | Freq: Once | INTRAMUSCULAR | Status: AC
  Administered 2023-07-16: 10 mg via INTRAVENOUS
  Filled 2023-07-16: qty 1

## 2023-07-16 NOTE — ED Triage Notes (Signed)
 Pt has hx/o COPD and these symptoms started about a week ago Pt has been using albuterol and nebs that she's been using without affect Pt in via EMS and was given albuterol nebulizer Has IV, but she states that they didn't give her anything via IV

## 2023-07-16 NOTE — ED Triage Notes (Signed)
 EMS brings pt in from home for c/o mid CP, nonradiating and SHOB since lunchtime today; hx COPD

## 2023-07-16 NOTE — ED Provider Triage Note (Signed)
 Emergency Medicine Provider Triage Evaluation Note  Carly [name] , a 52 y.o. female  was evaluated in triage.  Pt complains of shortness of breath, wheezing, dry cough.  Sick contacts at home.  History of asthma.  Last albuterol  use 1 hour ago  Review of Systems  Positive:  Negative:   Physical Exam  SpO2 99%  Gen:   Awake, no distress   Resp:  Normal effort, wheezing MSK:   Moves extremities without difficulty  Other:    Medical Decision Making  Medically screening exam initiated at 9:10 PM.  Appropriate orders placed.  HIMANI CORONA was informed that the remainder of the evaluation will be completed by another provider, this initial triage assessment does not replace that evaluation, and the importance of remaining in the ED until their evaluation is complete.  Patient presents with asthmatic crisis she will have x-ray blood labs and dexamethasone    Janit Kast, PA-C 07/16/23 2111

## 2023-07-17 ENCOUNTER — Inpatient Hospital Stay
Admission: EM | Admit: 2023-07-17 | Discharge: 2023-07-20 | DRG: 191 | Disposition: A | Discharge status: Home / Self Care | Payer: BC Managed Care – PPO | Attending: Internal Medicine | Admitting: Internal Medicine

## 2023-07-17 ENCOUNTER — Encounter: Payer: Self-pay | Admitting: Internal Medicine

## 2023-07-17 ENCOUNTER — Other Ambulatory Visit: Payer: Self-pay

## 2023-07-17 DIAGNOSIS — Z79899 Other long term (current) drug therapy: Secondary | ICD-10-CM | POA: Diagnosis not present

## 2023-07-17 DIAGNOSIS — J449 Chronic obstructive pulmonary disease, unspecified: Secondary | ICD-10-CM | POA: Diagnosis present

## 2023-07-17 DIAGNOSIS — Z833 Family history of diabetes mellitus: Secondary | ICD-10-CM | POA: Diagnosis not present

## 2023-07-17 DIAGNOSIS — D693 Immune thrombocytopenic purpura: Secondary | ICD-10-CM | POA: Diagnosis present

## 2023-07-17 DIAGNOSIS — J209 Acute bronchitis, unspecified: Secondary | ICD-10-CM | POA: Diagnosis present

## 2023-07-17 DIAGNOSIS — Z6841 Body Mass Index (BMI) 40.0 and over, adult: Secondary | ICD-10-CM | POA: Diagnosis not present

## 2023-07-17 DIAGNOSIS — J441 Chronic obstructive pulmonary disease with (acute) exacerbation: Principal | ICD-10-CM | POA: Diagnosis present

## 2023-07-17 DIAGNOSIS — Z818 Family history of other mental and behavioral disorders: Secondary | ICD-10-CM | POA: Diagnosis not present

## 2023-07-17 DIAGNOSIS — Z7951 Long term (current) use of inhaled steroids: Secondary | ICD-10-CM | POA: Diagnosis not present

## 2023-07-17 DIAGNOSIS — R739 Hyperglycemia, unspecified: Secondary | ICD-10-CM | POA: Diagnosis present

## 2023-07-17 DIAGNOSIS — F17210 Nicotine dependence, cigarettes, uncomplicated: Secondary | ICD-10-CM | POA: Diagnosis present

## 2023-07-17 DIAGNOSIS — E785 Hyperlipidemia, unspecified: Secondary | ICD-10-CM | POA: Diagnosis present

## 2023-07-17 DIAGNOSIS — Z823 Family history of stroke: Secondary | ICD-10-CM | POA: Diagnosis not present

## 2023-07-17 DIAGNOSIS — I10 Essential (primary) hypertension: Secondary | ICD-10-CM | POA: Diagnosis present

## 2023-07-17 DIAGNOSIS — F32A Depression, unspecified: Secondary | ICD-10-CM | POA: Diagnosis present

## 2023-07-17 DIAGNOSIS — F419 Anxiety disorder, unspecified: Secondary | ICD-10-CM | POA: Diagnosis present

## 2023-07-17 DIAGNOSIS — F411 Generalized anxiety disorder: Secondary | ICD-10-CM | POA: Diagnosis present

## 2023-07-17 DIAGNOSIS — Z83438 Family history of other disorder of lipoprotein metabolism and other lipidemia: Secondary | ICD-10-CM | POA: Diagnosis not present

## 2023-07-17 DIAGNOSIS — Z8249 Family history of ischemic heart disease and other diseases of the circulatory system: Secondary | ICD-10-CM | POA: Diagnosis not present

## 2023-07-17 DIAGNOSIS — E876 Hypokalemia: Secondary | ICD-10-CM | POA: Diagnosis present

## 2023-07-17 DIAGNOSIS — J439 Emphysema, unspecified: Secondary | ICD-10-CM | POA: Diagnosis present

## 2023-07-17 DIAGNOSIS — J45901 Unspecified asthma with (acute) exacerbation: Secondary | ICD-10-CM | POA: Diagnosis present

## 2023-07-17 LAB — RESPIRATORY PANEL BY PCR

## 2023-07-17 LAB — TROPONIN I (HIGH SENSITIVITY): Troponin I (High Sensitivity): 21 ng/L — ABNORMAL HIGH (ref <18)

## 2023-07-17 MED ORDER — METOPROLOL SUCCINATE ER 100 MG PO TB24
100.0000 mg | ORAL_TABLET | Freq: Every day | ORAL | Status: DC
  Administered 2023-07-17 – 2023-07-20 (×4): 100 mg via ORAL
  Filled 2023-07-17: qty 1
  Filled 2023-07-17: qty 2
  Filled 2023-07-17 (×2): qty 1

## 2023-07-17 MED ORDER — OXYBUTYNIN CHLORIDE ER 5 MG PO TB24
5.0000 mg | ORAL_TABLET | Freq: Every day | ORAL | Status: DC
  Administered 2023-07-17 – 2023-07-19 (×3): 5 mg via ORAL
  Filled 2023-07-17 (×4): qty 1

## 2023-07-17 MED ORDER — UMECLIDINIUM BROMIDE 62.5 MCG/ACT IN AEPB
1.0000 | INHALATION_SPRAY | Freq: Every day | RESPIRATORY_TRACT | Status: DC
  Filled 2023-07-17: qty 7

## 2023-07-17 MED ORDER — BUDESONIDE 0.25 MG/2ML IN SUSP
0.2500 mg | Freq: Two times a day (BID) | RESPIRATORY_TRACT | Status: DC
  Administered 2023-07-17 – 2023-07-20 (×7): 0.25 mg via RESPIRATORY_TRACT
  Filled 2023-07-17 (×8): qty 2

## 2023-07-17 MED ORDER — ALPRAZOLAM 0.5 MG PO TABS: 0.2500 mg | ORAL_TABLET | Freq: Every day | ORAL | Status: DC

## 2023-07-17 MED ORDER — ALBUTEROL SULFATE HFA 108 (90 BASE) MCG/ACT IN AERS: 2.0000 | INHALATION_SPRAY | RESPIRATORY_TRACT | Status: DC | PRN

## 2023-07-17 MED ORDER — UMECLIDINIUM BROMIDE 62.5 MCG/ACT IN AEPB
1.0000 | INHALATION_SPRAY | Freq: Every day | RESPIRATORY_TRACT | Status: DC
  Administered 2023-07-18 – 2023-07-20 (×3): 1 via RESPIRATORY_TRACT
  Filled 2023-07-17 (×2): qty 7

## 2023-07-17 MED ORDER — IPRATROPIUM-ALBUTEROL 0.5-2.5 (3) MG/3ML IN SOLN
3.0000 mL | Freq: Once | RESPIRATORY_TRACT | Status: AC
Start: 2023-07-17 — End: 2023-07-17
  Administered 2023-07-17: 3 mL via RESPIRATORY_TRACT
  Filled 2023-07-17: qty 3

## 2023-07-17 MED ORDER — IPRATROPIUM-ALBUTEROL 0.5-2.5 (3) MG/3ML IN SOLN
3.0000 mL | Freq: Once | RESPIRATORY_TRACT | Status: AC
  Administered 2023-07-17: 3 mL via RESPIRATORY_TRACT
  Filled 2023-07-17: qty 3

## 2023-07-17 MED ORDER — ONDANSETRON HCL 4 MG/2ML IJ SOLN: 4.0000 mg | Freq: Four times a day (QID) | INTRAMUSCULAR | Status: DC | PRN

## 2023-07-17 MED ORDER — BUDESON-GLYCOPYRROL-FORMOTEROL 160-9-4.8 MCG/ACT IN AERO: 2.0000 | INHALATION_SPRAY | Freq: Two times a day (BID) | RESPIRATORY_TRACT | Status: DC

## 2023-07-17 MED ORDER — FLUTICASONE FUROATE-VILANTEROL 200-25 MCG/ACT IN AEPB
1.0000 | INHALATION_SPRAY | Freq: Every day | RESPIRATORY_TRACT | Status: DC
  Filled 2023-07-17: qty 28

## 2023-07-17 MED ORDER — METHYLPREDNISOLONE SODIUM SUCC 125 MG IJ SOLR
125.0000 mg | Freq: Once | INTRAMUSCULAR | Status: AC
  Administered 2023-07-17: 125 mg via INTRAVENOUS
  Filled 2023-07-17: qty 2

## 2023-07-17 MED ORDER — NICOTINE 14 MG/24HR TD PT24
14.0000 mg | MEDICATED_PATCH | Freq: Every day | TRANSDERMAL | Status: DC
  Administered 2023-07-17 – 2023-07-20 (×4): 14 mg via TRANSDERMAL
  Filled 2023-07-17 (×4): qty 1

## 2023-07-17 MED ORDER — AMLODIPINE BESYLATE 10 MG PO TABS
10.0000 mg | ORAL_TABLET | Freq: Every day | ORAL | Status: DC
  Administered 2023-07-17 – 2023-07-20 (×4): 10 mg via ORAL
  Filled 2023-07-17: qty 2
  Filled 2023-07-17 (×3): qty 1

## 2023-07-17 MED ORDER — VARENICLINE TARTRATE 0.5 MG PO TABS
1.0000 mg | ORAL_TABLET | Freq: Two times a day (BID) | ORAL | Status: DC
  Administered 2023-07-17 – 2023-07-20 (×6): 1 mg via ORAL
  Filled 2023-07-17 (×7): qty 2

## 2023-07-17 MED ORDER — BENZONATATE 100 MG PO CAPS
100.0000 mg | ORAL_CAPSULE | Freq: Three times a day (TID) | ORAL | Status: DC | PRN
  Administered 2023-07-17 – 2023-07-19 (×6): 200 mg via ORAL
  Filled 2023-07-17 (×6): qty 2

## 2023-07-17 MED ORDER — TRAZODONE HCL 50 MG PO TABS
50.0000 mg | ORAL_TABLET | Freq: Every evening | ORAL | Status: DC | PRN
  Administered 2023-07-19: 50 mg via ORAL
  Filled 2023-07-17: qty 1

## 2023-07-17 MED ORDER — ORAL CARE MOUTH RINSE
15.0000 mL | OROMUCOSAL | Status: DC | PRN
Start: 2023-07-17 — End: 2023-07-20

## 2023-07-17 MED ORDER — ONDANSETRON HCL 4 MG PO TABS: 4.0000 mg | ORAL_TABLET | Freq: Four times a day (QID) | ORAL | Status: DC | PRN

## 2023-07-17 MED ORDER — ALBUTEROL SULFATE (2.5 MG/3ML) 0.083% IN NEBU
2.5000 mg | INHALATION_SOLUTION | RESPIRATORY_TRACT | Status: DC | PRN
  Administered 2023-07-19: 2.5 mg via RESPIRATORY_TRACT
  Filled 2023-07-17: qty 3

## 2023-07-17 MED ORDER — ESCITALOPRAM OXALATE 10 MG PO TABS
20.0000 mg | ORAL_TABLET | Freq: Every day | ORAL | Status: DC
  Administered 2023-07-17 – 2023-07-20 (×4): 20 mg via ORAL
  Filled 2023-07-17 (×4): qty 2

## 2023-07-17 MED ORDER — PANTOPRAZOLE SODIUM 40 MG PO TBEC
40.0000 mg | DELAYED_RELEASE_TABLET | Freq: Every day | ORAL | Status: DC
  Administered 2023-07-17 – 2023-07-20 (×4): 40 mg via ORAL
  Filled 2023-07-17 (×4): qty 1

## 2023-07-17 MED ORDER — LAMOTRIGINE 25 MG PO TABS
25.0000 mg | ORAL_TABLET | Freq: Every day | ORAL | Status: DC
  Administered 2023-07-17 – 2023-07-20 (×4): 25 mg via ORAL
  Filled 2023-07-17 (×4): qty 1

## 2023-07-17 MED ORDER — ACETAMINOPHEN 500 MG PO TABS
500.0000 mg | ORAL_TABLET | Freq: Four times a day (QID) | ORAL | Status: DC | PRN
  Administered 2023-07-17 – 2023-07-18 (×2): 1000 mg via ORAL
  Filled 2023-07-17 (×2): qty 2

## 2023-07-17 MED ORDER — IPRATROPIUM-ALBUTEROL 0.5-2.5 (3) MG/3ML IN SOLN
3.0000 mL | Freq: Four times a day (QID) | RESPIRATORY_TRACT | Status: DC
  Administered 2023-07-17 – 2023-07-19 (×9): 3 mL via RESPIRATORY_TRACT
  Filled 2023-07-17 (×10): qty 3

## 2023-07-17 MED ORDER — LORAZEPAM 2 MG/ML IJ SOLN
1.0000 mg | Freq: Once | INTRAMUSCULAR | Status: AC
  Administered 2023-07-17: 1 mg via INTRAVENOUS
  Filled 2023-07-17: qty 1

## 2023-07-17 MED ORDER — FLUTICASONE FUROATE-VILANTEROL 200-25 MCG/ACT IN AEPB
1.0000 | INHALATION_SPRAY | Freq: Every day | RESPIRATORY_TRACT | Status: DC
  Administered 2023-07-18 – 2023-07-20 (×3): 1 via RESPIRATORY_TRACT
  Filled 2023-07-17 (×2): qty 28

## 2023-07-17 MED ORDER — METHYLPREDNISOLONE SODIUM SUCC 125 MG IJ SOLR
125.0000 mg | INTRAMUSCULAR | Status: DC
  Administered 2023-07-18: 125 mg via INTRAVENOUS
  Filled 2023-07-17: qty 2

## 2023-07-17 MED ORDER — ARIPIPRAZOLE 2 MG PO TABS
2.0000 mg | ORAL_TABLET | Freq: Two times a day (BID) | ORAL | Status: DC
  Administered 2023-07-17 – 2023-07-20 (×6): 2 mg via ORAL
  Filled 2023-07-17 (×7): qty 1

## 2023-07-17 MED ORDER — METHYLPREDNISOLONE SODIUM SUCC 40 MG IJ SOLR
40.0000 mg | Freq: Three times a day (TID) | INTRAMUSCULAR | Status: DC
Start: 2023-07-17 — End: 2023-07-17

## 2023-07-17 NOTE — H&P (Signed)
 History and Physical    Carly [name] FMW:[id number] DOB: 1970-08-13 DOA: 07/17/2023  PCP: Hope Merle, MD (Confirm with patient/family/NH records and if not entered, this has to be entered at [facility] point of entry) Patient coming from: Home  I have personally briefly reviewed patient's old medical records in [facility]  Chief Complaint: Cough, wheezing, SOB  HPI: Carly [name] is a 53 y.o. female with medical history significant of asthma/COPD, cigarette smoking, chronic ITP, anxiety/depression, coming in for cough, wheezing, SOB.  Symptoms started about 1 week ago patient started dry cough, wheezing, shortness of breath denies any fever chills.  Occasionally coughing up clear phlegm.  No runny nose or sore throat, no sick contact.  Patient was started on Chantix  1 week ago, however she continues to smoke 1 to 2 cigarettes a day.  ED Course: Afebrile, tachycardia nonhypotensive O2 saturation 98% on room air.  Chest x-ray negative for acute infiltrates.  Blood work showed WBC 8.3, hemoglobin 14.3, platelet 35, sodium 133, glucose 183, creatinine 0.8.  Troponin negative x 2, EKG showed no acute ST changes Patient was given breathing treatment and IV Solu-Medrol   Review of Systems: As per HPI otherwise 14 point review of systems negative.    Past Medical History:  Diagnosis Date   Abnormal uterine bleeding (AUB) 08/26/2018   Anxiety    C. difficile diarrhea    07/04/21   Chicken pox    Depression    Depression, major, single episode, moderate (HCC) 08/04/2018   Diarrhea 06/07/2022   Dyspnea on exertion 05/24/2022   Elevated testosterone  level in female    Emphysema of lung (HCC)    bronchitis   Frequent headaches    Generalized anxiety disorder 08/23/2014   Hay fever    Hypertension    Idiopathic thrombocytopenic purpura (ITP) (HCC)    Iron deficiency 09/02/2017   Positive ANA (antinuclear antibody)    Thrombocytopenia (HCC) 02/12/2016    Past Surgical History:   Procedure Laterality Date   HEMATOMA EVACUATION N/A 04/07/2020   Procedure: EVACUATION HEMATOMA  AND APPLICATION OF PRESSURE DRESSING;  Surgeon: Verdon Keen, MD;  Location: [facility] ORS;  Service: Gynecology;  Laterality: N/A;   HYSTEROSCOPY WITH NOVASURE N/A 02/21/2018   Procedure: HYSTEROSCOPY WITH NOVASURE, POLYPECTOMY;  Surgeon: Verdon Keen, MD;  Location: [facility] ORS;  Service: Gynecology;  Laterality: N/A;   TUBAL LIGATION  1997   TUBAL LIGATION       reports that she has been smoking cigarettes. She has a 42 pack-year smoking history. She has never used smokeless tobacco. She reports current alcohol use of about 8.0 standard drinks of alcohol per week. She reports that she does not use drugs.  Allergies  Allergen Reactions   Wellbutrin  [Bupropion ]     Crazy    Meloxicam  Nausea Only    Family History  Problem Relation Age of Onset   Hyperlipidemia Mother    Heart disease Mother    Stroke Mother    Depression Mother    Mental illness Mother    Heart attack Mother 54   Hyperlipidemia Father    Depression Father    Mental illness Father    Diabetes Paternal Grandfather    Cancer Cousin      Prior to Admission medications   Medication Sig Start Date End Date Taking? Authorizing Provider  acetaminophen  (TYLENOL ) 500 MG tablet Take 500-1,000 mg by mouth every 6 (six) hours as needed for mild pain or headache.    [provider]  albuterol  (VENTOLIN  HFA) 108 (90 Base) MCG/ACT inhaler INHALE 2 PUFFS INTO THE LUNGS EVERY 6 HOURS AS NEEDED FOR WHEEZING OR SHORTNESS OF BREATH (COUGH) 03/25/23   Tamea Dedra CROME, MD  albuterol  (VENTOLIN  HFA) 108 [phone] Base) MCG/ACT inhaler Inhale 2 puffs into the lungs every 6 (six) hours as needed for wheezing or shortness of breath. 07/07/23   Blair, Diane W, FNP  ALPRAZolam  (XANAX ) 0.25 MG tablet Take 0.25 mg by mouth at bedtime.    [provider]  amLODipine  (NORVASC ) 10 MG tablet Take 10 mg by mouth daily. 04/24/23    [provider]  ARIPiprazole  (ABILIFY ) 2 MG tablet Take 2 tablets (4 mg total) by mouth daily. Dc rexulti  not covered Patient taking differently: Take 2 mg by mouth 2 (two) times daily. 02/23/23   Hope Merle, MD  benzonatate  (TESSALON ) 100 MG capsule Take 1-2 capsules (100-200 mg total) by mouth 3 (three) times daily as needed. 06/24/23   Moishe Chiquita HERO, NP  BREZTRI  AEROSPHERE 160-9-4.8 MCG/ACT AERO INHALE 2 PUFFS INTO THE LUNGS IN THE MORNING AND AT BEDTIME. 03/11/23   Tamea Dedra CROME, MD  cyanocobalamin  (VITAMIN B12) 1000 MCG/ML injection INJECT 1 ML (1,000 MCG TOTAL) INTO THE MUSCLE EVERY 30 DAYS. 08/06/22   Hope Merle, MD  D3-50 1.25 MG (50000 UT) capsule Take 50,000 Units by mouth once a week. 04/24/23   [provider]  escitalopram  (LEXAPRO ) 20 MG tablet Take 1 tablet (20 mg total) by mouth daily. 02/23/23   Hope Merle, MD  lamoTRIgine  (LAMICTAL ) 25 MG tablet Take 25 mg by mouth daily. 06/18/23   [provider]  metoprolol  succinate (TOPROL -XL) 100 MG 24 hr tablet TAKE 1 TABLET BY MOUTH DAILY. TAKE WITH OR IMMEDIATELY FOLLOWING A MEAL. 05/21/23   Hope Merle, MD  nicotine  (NICODERM CQ  - DOSED IN MG/24 HOURS) 14 mg/24hr patch Place 1 patch (14 mg total) onto the skin daily. 03/28/23   Lenon Marien CROME, MD  omeprazole  (PRILOSEC) 20 MG capsule Take 1 capsule (20 mg total) by mouth daily. 02/11/23   Hope Merle, MD  oxybutynin  (DITROPAN -XL) 5 MG 24 hr tablet TAKE 1 TABLET BY MOUTH EVERYDAY AT BEDTIME 05/22/23   Hope Merle, MD  traZODone  (DESYREL ) 50 MG tablet TAKE 0.5-1 TABLETS BY MOUTH AT BEDTIME AS NEEDED FOR SLEEP. 02/23/23   Hope Merle, MD  varenicline  (CHANTIX  CONTINUING MONTH PAK) 1 MG tablet Take 1 tablet (1 mg total) by mouth 2 (two) times daily. 07/31/23   Tamea Dedra CROME, MD  Varenicline  Tartrate, Starter, (CHANTIX  STARTING MONTH PAK) 0.5 MG X 11 & 1 MG X 42 TBPK Take as directed in the package that this is a starter pack. 07/04/23   Tamea Dedra CROME, MD    Physical Exam: Vitals:   07/16/23 2102 07/16/23 2114 07/16/23 2320 07/17/23 0736  BP:  (!) 160/86 (!) 146/89   Pulse:  (!) 119 93   Resp:  18 (!) 22   Temp:  98.8 F (37.1 C) 98.3 F (36.8 C) 99.3 F (37.4 C)  TempSrc:   Oral Axillary  SpO2: 99% 92% 93%     Constitutional: NAD, calm, comfortable Vitals:   07/16/23 2102 07/16/23 2114 07/16/23 2320 07/17/23 0736  BP:  (!) 160/86 (!) 146/89   Pulse:  (!) 119 93   Resp:  18 (!) 22   Temp:  98.8 F (37.1 C) 98.3 F (36.8 C) 99.3 F (37.4 C)  TempSrc:   Oral Axillary  SpO2: 99% 92% 93%  Eyes: PERRL, lids and conjunctivae normal ENMT: Mucous membranes are moist. Posterior pharynx clear of any exudate or lesions.Normal dentition.  Neck: normal, supple, no masses, no thyromegaly Respiratory: Diminished breathing sound bilaterally, diffused wheezing, no crackles.  Increasing respiratory effort. No accessory muscle use.  Cardiovascular: Regular rate and rhythm, no murmurs / rubs / gallops. No extremity edema. 2+ pedal pulses. No carotid bruits.  Abdomen: no tenderness, no masses palpated. No hepatosplenomegaly. Bowel sounds positive.  Musculoskeletal: no clubbing / cyanosis. No joint deformity upper and lower extremities. Good ROM, no contractures. Normal muscle tone.  Skin: no rashes, lesions, ulcers. No induration Neurologic: CN 2-12 grossly intact. Sensation intact, DTR normal. Strength 5/5 in all 4.  Psychiatric: Normal judgment and insight. Alert and oriented x 3. Normal mood.     Labs on Admission: I have personally reviewed following labs and imaging studies  CBC: Recent Labs  Lab 07/16/23 2114  WBC 11.6*  NEUTROABS 8.3*  HGB 14.7  HCT 43.3  MCV 93.3  PLT 35*   Basic Metabolic Panel: Recent Labs  Lab 07/16/23 2114  NA 133*  K 3.6  CL 95*  CO2 23  GLUCOSE 197*  BUN 10  CREATININE 0.45  CALCIUM 8.8*   GFR: Estimated Creatinine Clearance: 92.7 mL/min (by C-G formula based on SCr of 0.45  mg/dL). Liver Function Tests: No results for input(s): AST, ALT, ALKPHOS, BILITOT, PROT, ALBUMIN in the last 168 hours. No results for input(s): LIPASE, AMYLASE in the last 168 hours. No results for input(s): AMMONIA in the last 168 hours. Coagulation Profile: No results for input(s): INR, PROTIME in the last 168 hours. Cardiac Enzymes: No results for input(s): CKTOTAL, CKMB, CKMBINDEX, TROPONINI in the last 168 hours. BNP (last 3 results) No results for input(s): PROBNP in the last 8760 hours. HbA1C: No results for input(s): HGBA1C in the last 72 hours. CBG: No results for input(s): GLUCAP in the last 168 hours. Lipid Profile: No results for input(s): CHOL, HDL, LDLCALC, TRIG, CHOLHDL, LDLDIRECT in the last 72 hours. Thyroid  Function Tests: No results for input(s): TSH, T4TOTAL, FREET4, T3FREE, THYROIDAB in the last 72 hours. Anemia Panel: No results for input(s): VITAMINB12, FOLATE, FERRITIN, TIBC, IRON, RETICCTPCT in the last 72 hours. Urine analysis:    Component Value Date/Time   COLORURINE YELLOW (A) 09/08/2022 2143   APPEARANCEUR HAZY (A) 09/08/2022 2143   APPEARANCEUR Hazy (A) 07/17/2016 1438   LABSPEC 1.025 09/08/2022 2143   LABSPEC 1.002 10/06/2012 1138   PHURINE 5.0 09/08/2022 2143   GLUCOSEU 50 (A) 09/08/2022 2143   GLUCOSEU Negative 10/06/2012 1138   HGBUR MODERATE (A) 09/08/2022 2143   BILIRUBINUR Negative 03/20/2023 1402   BILIRUBINUR Negative 07/17/2016 1438   BILIRUBINUR Negative 10/06/2012 1138   KETONESUR 20 (A) 09/08/2022 2143   PROTEINUR Negative 03/20/2023 1402   PROTEINUR 30 (A) 09/08/2022 2143   UROBILINOGEN 0.2 03/20/2023 1402   NITRITE Negative 03/20/2023 1402   NITRITE NEGATIVE 09/08/2022 2143   LEUKOCYTESUR Negative 03/20/2023 1402   LEUKOCYTESUR NEGATIVE 09/08/2022 2143   LEUKOCYTESUR Negative 10/06/2012 1138    Radiological Exams on Admission: DG Chest 2 View Result  Date: 07/16/2023 CLINICAL DATA:  Shortness of breath. EXAM: CHEST - 2 VIEW COMPARISON:  March 27, 2023 FINDINGS: The heart size and mediastinal contours are within normal limits. Mild areas of atelectasis and/or infiltrate are seen within the bilateral lung bases. No pleural effusion or pneumothorax is identified. The visualized skeletal structures are unremarkable. IMPRESSION: Mild bibasilar atelectasis and/or infiltrate. Electronically Signed  By: Suzen Dials M.D.   On: 07/16/2023 22:50    EKG: Independently reviewed.  Sinus tachycardia, no acute ST changes.  Assessment/Plan Principal Problem:   COPD (chronic obstructive pulmonary disease) (HCC) Active Problems:   COPD exacerbation (HCC)  (please populate well all problems here in Problem List. (For example, if patient is on BP meds at home and you resume or decide to hold them, it is a problem that needs to be her. Same for CAD, COPD, HLD and so on)  Acute asthma/COPD exacerbation -Likely secondary to continuous tobacco use -Patient was started on Chantix  1 week ago for cigarette smoking cessation -Continue Solu-Medrol  every 8 hours -Check respiratory panel, hold off antibiotics -Pulmicort , ICS and LABA, DuoNebs and as needed albuterol  -IS and peak flow  Elevated glucose -Most recent A1c 5.48 months ago.  Outpatient PCP follow-up for repeat A1c  Essential hypertension -Continue amlodipine  and metoprolol   Anxiety/depression -Mentation at baseline, continue SSRI  Chronic ITP -Platelet count stable, outpatient follow-up with hematology -No chemical DVT prophylaxis  Cigarette smoking -On Chantix  -cessation of smoking education performed at bedside  DVT prophylaxis: SCD Code Status: Full code Family Communication: husband at bedside Disposition Plan: Expect more than 2 midnight hospital stay, patient has severe asthma exacerbation requiring IV Solu-Medrol  and closely monitoring clinical progress Consults called:  None Admission status: Tele admit   Cort ONEIDA Mana MD [facility] Pager [phone]  07/17/2023, 7:48 AM

## 2023-07-17 NOTE — ED Provider Notes (Signed)
 [facility] Provider Note    Event Date/Time   First MD Initiated Contact with Patient 07/17/23 [phone]     (approximate)  History   Chief Complaint: Shortness of Breath  HPI  Lanett TABITHIA STRODER is a 53 y.o. female with a past medical history of anxiety, COPD, hypertension, presents to the emergency department for shortness of breath.  According to the patient for the past week or so she has been experiencing significant shortness of breath cough and wheezing.  Patient has prednisone  and has been taking this over the past 4 or 5 days without relief.  Patient denies any fever.  Denies any sputum production with her cough.  No baseline O2 requirement.  Currently satting around 90 to 92% on room air.  Physical Exam   Triage Vital Signs: ED Triage Vitals  Encounter Vitals Group     BP 07/16/23 2114 (!) 160/86     Systolic BP Percentile --      Diastolic BP Percentile --      Pulse Rate 07/16/23 2114 (!) 119     Resp 07/16/23 2114 18     Temp 07/16/23 2114 98.8 F (37.1 C)     Temp Source 07/16/23 2320 Oral     SpO2 07/16/23 2102 99 %     Weight --      Height --      Head Circumference --      Peak Flow --      Pain Score 07/16/23 2107 0     Pain Loc --      Pain Education --      Exclude from Growth Chart --     Most recent vital signs: Vitals:   07/16/23 2114 07/16/23 2320  BP: (!) 160/86 (!) 146/89  Pulse: (!) 119 93  Resp: 18 (!) 22  Temp: 98.8 F (37.1 C) 98.3 F (36.8 C)  SpO2: 92% 93%    General: Awake, no distress.  CV:  Good peripheral perfusion.  Regular rate and rhythm  Resp:  Mild tachypnea equal breath sounds bilaterally.  Mild diffuse rhonchi expiratory wheeze bilaterally.  Frequent cough. Abd:  No distention.  Soft, nontender.  No rebound or guarding.  ED Results / Procedures / Treatments   EKG  EKG viewed and interpreted by myself shows sinus tachycardia at around 120 bpm.  Narrow QRS, normal axis, normal intervals,  nonspecific ST changes.  RADIOLOGY  I have reviewed and interpreted chest x-ray images.  No obvious consolidation on my evaluation.  Radiology has read the x-ray is negative.   MEDICATIONS ORDERED IN ED: Medications  methylPREDNISolone  sodium succinate (SOLU-MEDROL ) 125 mg/2 mL injection 125 mg (has no administration in time range)  ipratropium-albuterol  (DUONEB) 0.5-2.5 (3) MG/3ML nebulizer solution 3 mL (has no administration in time range)  ipratropium-albuterol  (DUONEB) 0.5-2.5 (3) MG/3ML nebulizer solution 3 mL (has no administration in time range)  dexamethasone  (DECADRON ) injection 10 mg (10 mg Intravenous Given 07/16/23 2120)     IMPRESSION / MDM / ASSESSMENT AND PLAN / ED COURSE  I reviewed the triage vital signs and the nursing notes.  Patient's presentation is most consistent with acute presentation with potential threat to life or bodily function.  Patient presents to the emergency department for worsening shortness of breath.  History of COPD.  States wheeze and increased cough.  Patient's workup today shows a reassuring CBC with slight leukocytosis 11,600, overall reassuring chemistry.  Patient's troponin slightly elevated although largely unchanged after several hours.  Patient's chest x-ray is clear.  On examination patient has rhonchi and wheeze bilaterally frequent cough.  Patient states this feels identical to her typical COPD exacerbation which often require hospitalization.  Currently satting around 90 to 92% on room air.  Will dose several DuoNebs IV Solu-Medrol  and reassess.  Patient agreeable to plan of care.  Patient continues to feel short of breath with continued wheeze.  Patient states she feels like she is having a panic attack we will place the patient on a cardiac monitor, we will dose a small dose of Ativan  and continue to closely monitor.  Given the patient's continued symptoms shortness of breath wheezing history of COPD with borderline low O2 sat we will  admit to the hospital service for further workup and treatment.  Patient agreeable to plan.  FINAL CLINICAL IMPRESSION(S) / ED DIAGNOSES   COPD exacerbation Dyspnea   Note:  This document was prepared using Dragon voice recognition software and may include unintentional dictation errors.   Dorothyann Drivers, MD 07/17/23 [phone]

## 2023-07-17 NOTE — Progress Notes (Signed)
 PHARMACIST - PHYSICIAN COMMUNICATION   CONCERNING: Methylprednisolone  IV    Current order: Methylprednisolone  IV 40mg  q 8hrs     DESCRIPTION: Per  Protocol:   IV methylprednisolone  will be converted to either a q12h or q24h frequency with the same total daily dose (TDD).  Ordered Dose: 1 to 125 mg TDD; convert to: TDD q24h.  Ordered Dose: 126 to 250 mg TDD; convert to: TDD div q12h.  Ordered Dose: >250 mg TDD; DAW.  Order has been adjusted to: Methylprednisolone  IV 125mg  q 24hrs  Selita Staiger Rodriguez-Guzman PharmD, BCPS 07/17/2023 8:03 AM

## 2023-07-18 DIAGNOSIS — J441 Chronic obstructive pulmonary disease with (acute) exacerbation: Secondary | ICD-10-CM | POA: Diagnosis not present

## 2023-07-18 LAB — CBC
HCT: 42.5 % (ref 36.0–46.0)
Hemoglobin: 14.1 g/dL (ref 12.0–15.0)
MCH: 31.7 pg (ref 26.0–34.0)
MCHC: 33.2 g/dL (ref 30.0–36.0)
MCV: 95.5 fL (ref 80.0–100.0)
Platelets: 30 10*3/uL — ABNORMAL LOW (ref 150–400)
RBC: 4.45 MIL/uL (ref 3.87–5.11)
RDW: 15 % (ref 11.5–15.5)
WBC: 14.2 10*3/uL — ABNORMAL HIGH (ref 4.0–10.5)
nRBC: 0 % (ref 0.0–0.2)

## 2023-07-18 LAB — BASIC METABOLIC PANEL
Anion gap: 17 — ABNORMAL HIGH (ref 5–15)
BUN: 11 mg/dL (ref 6–20)
CO2: 23 mmol/L (ref 22–32)
Calcium: 8.4 mg/dL — ABNORMAL LOW (ref 8.9–10.3)
Chloride: 96 mmol/L — ABNORMAL LOW (ref 98–111)
Creatinine, Ser: 0.52 mg/dL (ref 0.44–1.00)
GFR, Estimated: >60 mL/min (ref >60)
Glucose, Bld: 156 mg/dL — ABNORMAL HIGH (ref 70–99)
Potassium: 2.9 mmol/L — ABNORMAL LOW (ref 3.5–5.1)
Sodium: 136 mmol/L (ref 135–145)

## 2023-07-18 MED ORDER — POTASSIUM CHLORIDE CRYS ER 20 MEQ PO TBCR
40.0000 meq | EXTENDED_RELEASE_TABLET | ORAL | Status: AC
  Administered 2023-07-18 (×3): 40 meq via ORAL
  Filled 2023-07-18 (×3): qty 2

## 2023-07-18 MED ORDER — SODIUM CHLORIDE 0.9 % IV SOLN
500.0000 mg | INTRAVENOUS | Status: DC
  Administered 2023-07-18 – 2023-07-19 (×2): 500 mg via INTRAVENOUS
  Filled 2023-07-18 (×3): qty 5

## 2023-07-18 MED ORDER — PREDNISONE 20 MG PO TABS
40.0000 mg | ORAL_TABLET | Freq: Every day | ORAL | Status: DC
  Administered 2023-07-19 – 2023-07-20 (×2): 40 mg via ORAL
  Filled 2023-07-18 (×2): qty 2

## 2023-07-18 NOTE — TOC CM/SW Note (Signed)
 Transition of Care [facility]) - Inpatient Brief Assessment   Patient Details  Name: Carly [name] MRN: [id number] Date of Birth: 08-12-1970  Transition of Care [facility]) CM/SW Contact:    Corean ONEIDA Haddock, RN Phone Number: 07/18/2023, 12:29 PM   Clinical Narrative:    Transition of Care (TOC) Screening Note   Patient Details  Name: Carly [name] Date of Birth: [birth year]   Transition of Care [facility]) CM/SW Contact:    Corean ONEIDA Haddock, RN Phone Number: 07/18/2023, 12:29 PM    Transition of Care Department [facility]) has reviewed patient and no TOC needs have been identified at this time. . If new patient transition needs arise, please place a TOC consult.   Transition of Care Asessment: Insurance and Status: Insurance coverage has been reviewed Patient has primary care physician: Yes     Prior/Current Home Services: No current home services Social Drivers of Health Review: SDOH reviewed no interventions necessary Readmission risk has been reviewed: Yes Transition of care needs: no transition of care needs at this time

## 2023-07-18 NOTE — Progress Notes (Signed)
 Progress Note   Patient: Carly [name] FMW:[id number] DOB: Jun 20, 1971 DOA: 07/17/2023     1 DOS: the patient was seen and examined on 07/18/2023   Brief hospital course: Carly [name] is a 53 y.o. female with medical history significant of asthma/COPD, cigarette smoking, chronic ITP, anxiety/depression, coming in for cough, wheezing, SOB.  Currently being managed for COPD exacerbation  Assessment and Plan: Acute asthma/COPD exacerbation -Likely secondary to continuous tobacco use -Patient was started on Chantix  1 week ago for cigarette smoking cessation -IV Solu-Medrol  switched to oral prednisone  -Respiratory panel is negative -Continue azithromycin  -Continue Pulmicort , ICS and LABA, DuoNebs and as needed albuterol   Severe hypokalemia Potassium level 2.9 on 07/17/2022 Continue repletion and monitoring  Elevated glucose -Most recent A1c 5.48 months ago.  Outpatient PCP follow-up for repeat A1c   Essential hypertension -Continue amlodipine  and metoprolol    Anxiety/depression -Mentation at baseline, continue SSRI   Chronic ITP -Platelet count stable, outpatient follow-up with hematology -No chemical DVT prophylaxis   Cigarette smoking -On Chantix  -cessation of smoking education performed at bedside   DVT prophylaxis: SCD  Code Status: Full code  Family Communication: husband at bedside  Disposition Plan: Hopefully home when respiratory function improves   Subjective:  Patient seen and examined at bedside this morning Still has some wheezing and exertional dyspnea Has a cough but denies chest pain nausea vomiting or abdominal pain  Physical Exam:  HEENT: Normocephalic atraumatic Neck: normal, supple, no masses, no thyromegaly Respiratory: Decreased air entry bilaterally with diffuse wheezing bilaterally Cardiovascular: Regular rate and rhythm, no murmurs  Abdomen: no tenderness, no masses palpated. No hepatosplenomegaly. Bowel sounds positive.  Musculoskeletal: no  clubbing / cyanosis. No joint deformity upper and lower extremities. Good ROM, no contractures. Normal muscle tone.  Skin: no rashes, lesions, ulcers. No induration Neurologic: CN 2-12 grossly intact. Sensation intact, DTR normal. Strength 5/5 in all 4.  Psychiatric: Normal judgment and insight. Alert and oriented x 3. Normal mood.   Vitals:   07/18/23 0729 07/18/23 0733 07/18/23 1323 07/18/23 1456  BP:  (!) 149/82  (!) 143/77  Pulse:  93  (!) 107  Resp:  19  18  Temp:  98 F (36.7 C)  98.2 F (36.8 C)  TempSrc:    Oral  SpO2: 95% 98% 95% 94%  Weight:      Height:        Data Reviewed:    Latest Ref Rng & Units 07/18/2023    5:09 AM 07/16/2023    9:14 PM 03/28/2023    3:38 AM  BMP  Glucose 70 - 99 mg/dL 843  802  811   BUN 6 - 20 mg/dL 11  10  9    Creatinine 0.44 - 1.00 mg/dL 9.47  9.54  9.49   Sodium 135 - 145 mmol/L 136  133  136   Potassium 3.5 - 5.1 mmol/L 2.9  3.6  4.0   Chloride 98 - 111 mmol/L 96  95  100   CO2 22 - 32 mmol/L 23  23  24    Calcium 8.9 - 10.3 mg/dL 8.4  8.8  8.1        Latest Ref Rng & Units 07/18/2023    5:09 AM 07/16/2023    9:14 PM 05/13/2023    2:14 PM  CBC  WBC 4.0 - 10.5 K/uL 14.2  11.6  8.8   Hemoglobin 12.0 - 15.0 g/dL 85.8  85.2  85.1   Hematocrit 36.0 - 46.0 % 42.5  43.3  44.7   Platelets 150 - 400 K/uL 30  35  52       Time spent: 40 minutes  Author: Drue ONEIDA Potter, MD 07/18/2023 6:14 PM  For on call review [email or web address].

## 2023-07-18 NOTE — Plan of Care (Signed)
 Pt is alert and oriented x 4. Up with 1 assist to bathroom due to unsteady gait at times due to shortness of breath with exertion. Room air sating 92%. Vitals stable. Tylenol  given for headache. Tessalon  pearls given for cough x 1.  Problem: Education: Goal: Knowledge of General Education information will improve Description: Including pain rating scale, medication(s)/side effects and non-pharmacologic comfort measures Outcome: Progressing   Problem: Health Behavior/Discharge Planning: Goal: Ability to manage health-related needs will improve Outcome: Progressing   Problem: Clinical Measurements: Goal: Ability to maintain clinical measurements within normal limits will improve Outcome: Progressing Goal: Will remain free from infection Outcome: Progressing Goal: Diagnostic test results will improve Outcome: Progressing Goal: Respiratory complications will improve Outcome: Progressing Goal: Cardiovascular complication will be avoided Outcome: Progressing   Problem: Activity: Goal: Risk for activity intolerance will decrease Outcome: Progressing   Problem: Nutrition: Goal: Adequate nutrition will be maintained Outcome: Progressing   Problem: Coping: Goal: Level of anxiety will decrease Outcome: Progressing   Problem: Elimination: Goal: Will not experience complications related to bowel motility Outcome: Progressing Goal: Will not experience complications related to urinary retention Outcome: Progressing   Problem: Pain Management: Goal: General experience of comfort will improve Outcome: Progressing   Problem: Safety: Goal: Ability to remain free from injury will improve Outcome: Progressing   Problem: Skin Integrity: Goal: Risk for impaired skin integrity will decrease Outcome: Progressing

## 2023-07-18 NOTE — Plan of Care (Signed)

## 2023-07-18 NOTE — Progress Notes (Deleted)
 IVs removed without complications. Discharge instructions reviewed, patient verbalized understanding. Family assisting patient to get dressed.  Carly [name]

## 2023-07-19 DIAGNOSIS — J441 Chronic obstructive pulmonary disease with (acute) exacerbation: Secondary | ICD-10-CM | POA: Diagnosis not present

## 2023-07-19 DIAGNOSIS — D693 Immune thrombocytopenic purpura: Secondary | ICD-10-CM

## 2023-07-19 DIAGNOSIS — E876 Hypokalemia: Secondary | ICD-10-CM

## 2023-07-19 LAB — BASIC METABOLIC PANEL
Anion gap: 14 (ref 5–15)
BUN: 10 mg/dL (ref 6–20)
CO2: 24 mmol/L (ref 22–32)
Calcium: 8.2 mg/dL — ABNORMAL LOW (ref 8.9–10.3)
Chloride: 97 mmol/L — ABNORMAL LOW (ref 98–111)
Creatinine, Ser: 0.54 mg/dL (ref 0.44–1.00)
GFR, Estimated: >60 mL/min (ref >60)
Glucose, Bld: 130 mg/dL — ABNORMAL HIGH (ref 70–99)
Potassium: 3.6 mmol/L (ref 3.5–5.1)
Sodium: 135 mmol/L (ref 135–145)

## 2023-07-19 LAB — CBC WITH DIFFERENTIAL/PLATELET
Abs Immature Granulocytes: 0.06 10*3/uL (ref 0.00–0.07)
Basophils Absolute: 0 10*3/uL (ref 0.0–0.1)
Basophils Relative: 0 %
Eosinophils Absolute: 0 10*3/uL (ref 0.0–0.5)
Eosinophils Relative: 0 %
HCT: 39.3 % (ref 36.0–46.0)
Hemoglobin: 13.3 g/dL (ref 12.0–15.0)
Immature Granulocytes: 1 %
Lymphocytes Relative: 20 %
Lymphs Abs: 2.1 10*3/uL (ref 0.7–4.0)
MCH: 31.9 pg (ref 26.0–34.0)
MCHC: 33.8 g/dL (ref 30.0–36.0)
MCV: 94.2 fL (ref 80.0–100.0)
Monocytes Absolute: 0.9 10*3/uL (ref 0.1–1.0)
Monocytes Relative: 8 %
Neutro Abs: 7.7 10*3/uL (ref 1.7–7.7)
Neutrophils Relative %: 71 %
Platelets: 21 10*3/uL — CL (ref 150–400)
RBC: 4.17 MIL/uL (ref 3.87–5.11)
RDW: 15.1 % (ref 11.5–15.5)
Smear Review: NORMAL
WBC: 10.8 10*3/uL — ABNORMAL HIGH (ref 4.0–10.5)
nRBC: 0 % (ref 0.0–0.2)

## 2023-07-19 LAB — TYPE AND SCREEN
ABO/RH(D): O NEG
Antibody Screen: NEGATIVE

## 2023-07-19 MED ORDER — IPRATROPIUM-ALBUTEROL 0.5-2.5 (3) MG/3ML IN SOLN: 3.0000 mL | Freq: Two times a day (BID) | RESPIRATORY_TRACT | Status: DC

## 2023-07-19 MED ORDER — ROMIPLOSTIM INJECTION 500 MCG
600.0000 ug | Freq: Once | SUBCUTANEOUS | Status: AC
  Administered 2023-07-19: 600 ug via SUBCUTANEOUS
  Filled 2023-07-19: qty 0.5
  Filled 2023-07-19: qty 1.2

## 2023-07-19 MED ORDER — IPRATROPIUM-ALBUTEROL 0.5-2.5 (3) MG/3ML IN SOLN
3.0000 mL | Freq: Three times a day (TID) | RESPIRATORY_TRACT | Status: DC
  Administered 2023-07-19 – 2023-07-20 (×3): 3 mL via RESPIRATORY_TRACT
  Filled 2023-07-19 (×3): qty 3

## 2023-07-19 MED ORDER — LOPERAMIDE HCL 2 MG PO CAPS
2.0000 mg | ORAL_CAPSULE | ORAL | Status: DC | PRN
  Administered 2023-07-19: 2 mg via ORAL
  Filled 2023-07-19: qty 1

## 2023-07-19 NOTE — Plan of Care (Signed)
   Problem: Education: Goal: Knowledge of General Education information will improve Description Including pain rating scale, medication(s)/side effects and non-pharmacologic comfort measures Outcome: Progressing

## 2023-07-19 NOTE — Progress Notes (Signed)
 Progress Note   Patient: Carly [name] FMW:[id number] DOB: 1971/06/26 DOA: 07/17/2023     2 DOS: the patient was seen and examined on 07/19/2023   Brief hospital course: Carly [name] is a 53 y.o. female with medical history significant of asthma/COPD, cigarette smoking, chronic ITP, anxiety/depression, coming in for cough, wheezing, SOB.  Currently being managed for COPD exacerbation.  1/3: Hemodynamically stable, platelet decreased to 21 today, patient normally get Nplate  infusion every 8 weeks and was due for her dose.  Discussed with Dr. Jacobo and ordered Nplate  according to his instructions.  Assessment and Plan: Acute asthma/COPD exacerbation -Likely secondary to continuous tobacco use -Patient was started on Chantix  1 week ago for cigarette smoking cessation -Continue with prednisone  -Respiratory panel is negative -Continue azithromycin  -Continue Pulmicort , ICS and LABA, DuoNebs and as needed albuterol   Severe hypokalemia Potassium improved to 3.6 today. Continue to monitor and replete as needed  Elevated glucose -Most recent A1c 5.48 months ago.  Outpatient PCP follow-up for repeat A1c   Essential hypertension -Continue amlodipine  and metoprolol    Anxiety/depression -Mentation at baseline, continue SSRI   Chronic ITP -Platelet decreased to 21 today.  Patient normally gets Nplate  infusion every 8 weeks, last infusion was on 10/28.  Discussed with Dr. Jacobo and he advised to give a dose of Nplate  which was ordered. -No chemical DVT prophylaxis   Cigarette smoking -On Chantix  -cessation of smoking education performed at bedside   DVT prophylaxis: SCD  Code Status: Full code  Family Communication: husband at bedside  Disposition Plan: Hopefully home when respiratory function improves   Subjective:  Patient was seen and examined today.  Seems improving and no new concern.  Physical Exam:  General.  Morbidly obese lady, in no acute distress. Pulmonary.   Some scattered wheeze bilaterally, normal respiratory effort. CV.  Regular rate and rhythm, no JVD, rub or murmur. Abdomen.  Soft, nontender, nondistended, BS positive. CNS.  Alert and oriented .  No focal neurologic deficit. Extremities.  No edema, no cyanosis, pulses intact and symmetrical. Psychiatry.  Judgment and insight appears normal.   Vitals:   07/19/23 0403 07/19/23 0721 07/19/23 0747 07/19/23 1014  BP: 135/69  139/77 (!) 140/78  Pulse: 100  (!) 103 (!) 103  Resp: 16  18   Temp: 97.9 F (36.6 C)  98 F (36.7 C)   TempSrc: Oral     SpO2: 94% 96% 94%   Weight:      Height:        Data Reviewed:    Latest Ref Rng & Units 07/19/2023    4:39 AM 07/18/2023    5:09 AM 07/16/2023    9:14 PM  BMP  Glucose 70 - 99 mg/dL 869  843  802   BUN 6 - 20 mg/dL 10  11  10    Creatinine 0.44 - 1.00 mg/dL 9.45  9.47  9.54   Sodium 135 - 145 mmol/L 135  136  133   Potassium 3.5 - 5.1 mmol/L 3.6  2.9  3.6   Chloride 98 - 111 mmol/L 97  96  95   CO2 22 - 32 mmol/L 24  23  23    Calcium 8.9 - 10.3 mg/dL 8.2  8.4  8.8        Latest Ref Rng & Units 07/19/2023    4:39 AM 07/18/2023    5:09 AM 07/16/2023    9:14 PM  CBC  WBC 4.0 - 10.5 K/uL 10.8  14.2  11.6  Hemoglobin 12.0 - 15.0 g/dL 86.6  85.8  85.2   Hematocrit 36.0 - 46.0 % 39.3  42.5  43.3   Platelets 150 - 400 K/uL 21  30  35        Time spent: 45 minutes  Author: Amaryllis Dare, MD 07/19/2023 2:20 PM  For on call review [email or web address].

## 2023-07-20 DIAGNOSIS — D693 Immune thrombocytopenic purpura: Secondary | ICD-10-CM | POA: Diagnosis not present

## 2023-07-20 DIAGNOSIS — J441 Chronic obstructive pulmonary disease with (acute) exacerbation: Secondary | ICD-10-CM | POA: Diagnosis not present

## 2023-07-20 LAB — BASIC METABOLIC PANEL
Anion gap: 9 (ref 5–15)
BUN: 8 mg/dL (ref 6–20)
CO2: 25 mmol/L (ref 22–32)
Calcium: 8.3 mg/dL — ABNORMAL LOW (ref 8.9–10.3)
Chloride: 102 mmol/L (ref 98–111)
Creatinine, Ser: 0.52 mg/dL (ref 0.44–1.00)
GFR, Estimated: >60 mL/min (ref >60)
Glucose, Bld: 115 mg/dL — ABNORMAL HIGH (ref 70–99)
Potassium: 3.4 mmol/L — ABNORMAL LOW (ref 3.5–5.1)
Sodium: 136 mmol/L (ref 135–145)

## 2023-07-20 LAB — MAGNESIUM: Magnesium: 1.5 mg/dL — ABNORMAL LOW (ref 1.7–2.4)

## 2023-07-20 LAB — CBC
HCT: 41.2 % (ref 36.0–46.0)
Hemoglobin: 13.8 g/dL (ref 12.0–15.0)
MCH: 32 pg (ref 26.0–34.0)
MCHC: 33.5 g/dL (ref 30.0–36.0)
MCV: 95.6 fL (ref 80.0–100.0)
Platelets: 27 10*3/uL — CL (ref 150–400)
RBC: 4.31 MIL/uL (ref 3.87–5.11)
RDW: 15.2 % (ref 11.5–15.5)
WBC: 10.2 10*3/uL (ref 4.0–10.5)
nRBC: 0 % (ref 0.0–0.2)

## 2023-07-20 MED ORDER — PREDNISONE 20 MG PO TABS: 40.0000 mg | ORAL_TABLET | Freq: Every day | ORAL | 0 refills | Status: AC

## 2023-07-20 MED ORDER — POTASSIUM CHLORIDE CRYS ER 20 MEQ PO TBCR: 40.0000 meq | EXTENDED_RELEASE_TABLET | Freq: Once | ORAL | Status: DC

## 2023-07-20 NOTE — Discharge Summary (Signed)
 Physician Discharge Summary   Patient: Carly [name] MRN: [id number] DOB: 1971/02/17  Admit date:     07/17/2023  Discharge date: 07/20/23  Discharge Physician: Amaryllis Dare   PCP: Hope Merle, MD   Recommendations at discharge:  Please obtain CBC and BMP on follow-up Follow-up with primary care provider Follow-up with hematology  Discharge Diagnoses: Principal Problem:   COPD (chronic obstructive pulmonary disease) (HCC) Active Problems:   COPD exacerbation (HCC)   Chronic ITP (idiopathic thrombocytopenia) [facility])   Hospital Course: Carly [name] is a 53 y.o. female with medical history significant of asthma/COPD, cigarette smoking, chronic ITP, anxiety/depression, coming in for cough, wheezing, SOB.  Respiratory viral panel was negative.  Patient was counseled again for smoking cessation.  She was treated for COPD exacerbation, completed 3-day of Zithromax  and is being discharged on 3 more days of prednisone .  On room air and no more wheezing on discharge.  During the course of hospitalization she also had hypokalemia which was repleted and normalized before discharge.  Patient also developed worsening platelet count which dropped up to 21, has an history of biopsy-proven chronic ITP for which she follow-up with hematology and normally gets Nplate  infusion every 8 weeks, last infusion was on 10/28. Discussed with Dr. Jacobo and he advised to give a dose of Nplate  which was given on 07/19/2023.  Platelet next day improved to 27.  For which she will follow-up with Dr. Jacobo next week for further assistance.  Patient will continue on current medications and need to have a close follow-up with her providers for further management.  Consultants: Hematology Procedures performed: None Disposition: Home Diet recommendation:  Discharge Diet Orders (From admission, onward)     Start     Ordered   07/20/23 0000  Diet - low sodium heart healthy        07/20/23 1041            Cardiac and Carb modified diet DISCHARGE MEDICATION: Allergies as of 07/20/2023       Reactions   Wellbutrin  [bupropion ]    Crazy   Meloxicam  Nausea Only        Medication List     STOP taking these medications    ALPRAZolam  0.25 MG tablet Commonly known as: XANAX        TAKE these medications    acetaminophen  500 MG tablet Commonly known as: TYLENOL  Take 500-1,000 mg by mouth every 6 (six) hours as needed for mild pain or headache.   albuterol  108 (90 Base) MCG/ACT inhaler Commonly known as: VENTOLIN  HFA Inhale 2 puffs into the lungs every 6 (six) hours as needed for wheezing or shortness of breath. What changed: Another medication with the same name was removed. Continue taking this medication, and follow the directions you see here.   amLODipine  10 MG tablet Commonly known as: NORVASC  Take 10 mg by mouth daily.   ARIPiprazole  2 MG tablet Commonly known as: Abilify  Take 2 tablets (4 mg total) by mouth daily. Dc rexulti  not covered What changed:  how much to take when to take this additional instructions   benzonatate  100 MG capsule Commonly known as: TESSALON  Take 1-2 capsules (100-200 mg total) by mouth 3 (three) times daily as needed.   Breztri  Aerosphere 160-9-4.8 MCG/ACT Aero Generic drug: Budeson-Glycopyrrol-Formoterol  INHALE 2 PUFFS INTO THE LUNGS IN THE MORNING AND AT BEDTIME.   cyanocobalamin  1000 MCG/ML injection Commonly known as: VITAMIN B12 INJECT 1 ML (1,000 MCG TOTAL) INTO THE MUSCLE EVERY 30 DAYS.  D3-50 1.25 MG (50000 UT) capsule Generic drug: Cholecalciferol  Take 50,000 Units by mouth once a week.   escitalopram  20 MG tablet Commonly known as: LEXAPRO  Take 1 tablet (20 mg total) by mouth daily.   lamoTRIgine  25 MG tablet Commonly known as: LAMICTAL  Take 25 mg by mouth daily.   metoprolol  succinate 100 MG 24 hr tablet Commonly known as: TOPROL -XL TAKE 1 TABLET BY MOUTH DAILY. TAKE WITH OR IMMEDIATELY FOLLOWING A MEAL.    nicotine  14 mg/24hr patch Commonly known as: NICODERM CQ  - dosed in mg/24 hours Place 1 patch (14 mg total) onto the skin daily.   omeprazole  20 MG capsule Commonly known as: PRILOSEC Take 1 capsule (20 mg total) by mouth daily.   oxybutynin  5 MG 24 hr tablet Commonly known as: DITROPAN -XL TAKE 1 TABLET BY MOUTH EVERYDAY AT BEDTIME   predniSONE  20 MG tablet Commonly known as: DELTASONE  Take 2 tablets (40 mg total) by mouth daily with breakfast for 3 days. Start taking on: July 21, 2023   traZODone  50 MG tablet Commonly known as: DESYREL  TAKE 0.5-1 TABLETS BY MOUTH AT BEDTIME AS NEEDED FOR SLEEP.   Varenicline  Tartrate (Starter) 0.5 MG X 11 & 1 MG X 42 Tbpk Commonly known as: Chantix  Starting Month Pak Take as directed in the package that this is a starter pack. What changed: Another medication with the same name was removed. Continue taking this medication, and follow the directions you see here.        Follow-up Information     Hope Merle, MD. Schedule an appointment as soon as possible for a visit in 1 week(s).   Specialty: Family Medicine Contact information: [address] [place] [address] [phone]         Jacobo [name], MD. Schedule an appointment as soon as possible for a visit.   Specialty: Oncology Contact information: [address] [place] [address] [phone]                Discharge Exam: Fredricka Weights   07/17/23 2019  Weight: 101.4 kg   General.   morbidly obese lady, in no acute distress. Pulmonary.  Lungs clear bilaterally, normal respiratory effort. CV.  Regular rate and rhythm, no JVD, rub or murmur. Abdomen.  Soft, nontender, nondistended, BS positive. CNS.  Alert and oriented .  No focal neurologic deficit. Extremities.  No edema, no cyanosis, pulses intact and symmetrical. Psychiatry.  Judgment and insight appears normal.   Condition at discharge: stable  The results of significant  diagnostics from this hospitalization (including imaging, microbiology, ancillary and laboratory) are listed below for reference.   Imaging Studies: DG Chest 2 View Result Date: 07/16/2023 CLINICAL DATA:  Shortness of breath. EXAM: CHEST - 2 VIEW COMPARISON:  March 27, 2023 FINDINGS: The heart size and mediastinal contours are within normal limits. Mild areas of atelectasis and/or infiltrate are seen within the bilateral lung bases. No pleural effusion or pneumothorax is identified. The visualized skeletal structures are unremarkable. IMPRESSION: Mild bibasilar atelectasis and/or infiltrate. Electronically Signed   By: Suzen Dials M.D.   On: 07/16/2023 22:50    Microbiology: Results for orders placed or performed during the hospital encounter of 07/17/23  Resp panel by RT-PCR (RSV, Flu A&B, Covid) Anterior Nasal Swab     Status: None   Collection Time: 07/16/23  9:13 PM   Specimen: Anterior Nasal Swab  Result Value Ref Range Status   SARS Coronavirus 2 by RT PCR NEGATIVE NEGATIVE Final  Comment: (NOTE) SARS-CoV-2 target nucleic acids are NOT DETECTED.  The SARS-CoV-2 RNA is generally detectable in upper respiratory specimens during the acute phase of infection. The lowest concentration of SARS-CoV-2 viral copies this assay can detect is 138 copies/mL. A negative result does not preclude SARS-Cov-2 infection and should not be used as the sole basis for treatment or other patient management decisions. A negative result may occur with  improper specimen collection/handling, submission of specimen other than nasopharyngeal swab, presence of viral mutation(s) within the areas targeted by this assay, and inadequate number of viral copies(<138 copies/mL). A negative result must be combined with clinical observations, patient history, and epidemiological information. The expected result is Negative.  Fact Sheet for Patients:  bloggercourse.com  Fact Sheet  for Healthcare Providers:  seriousbroker.it  This test is no t yet approved or cleared by the [place]  FDA and  has been authorized for detection and/or diagnosis of SARS-CoV-2 by FDA under an Emergency Use Authorization (EUA). This EUA will remain  in effect (meaning this test can be used) for the duration of the COVID-19 declaration under Section 564(b)(1) of the Act, 21 U.S.C.section 360bbb-3(b)(1), unless the authorization is terminated  or revoked sooner.       Influenza A by PCR NEGATIVE NEGATIVE Final   Influenza B by PCR NEGATIVE NEGATIVE Final    Comment: (NOTE) The Xpert Xpress SARS-CoV-2/FLU/RSV plus assay is intended as an aid in the diagnosis of influenza from Nasopharyngeal swab specimens and should not be used as a sole basis for treatment. Nasal washings and aspirates are unacceptable for Xpert Xpress SARS-CoV-2/FLU/RSV testing.  Fact Sheet for Patients: bloggercourse.com  Fact Sheet for Healthcare Providers: seriousbroker.it  This test is not yet approved or cleared by the [place]  FDA and has been authorized for detection and/or diagnosis of SARS-CoV-2 by FDA under an Emergency Use Authorization (EUA). This EUA will remain in effect (meaning this test can be used) for the duration of the COVID-19 declaration under Section 564(b)(1) of the Act, 21 U.S.C. section 360bbb-3(b)(1), unless the authorization is terminated or revoked.     Resp Syncytial Virus by PCR NEGATIVE NEGATIVE Final    Comment: (NOTE) Fact Sheet for Patients: bloggercourse.com  Fact Sheet for Healthcare Providers: seriousbroker.it  This test is not yet approved or cleared by the [place]  FDA and has been authorized for detection and/or diagnosis of SARS-CoV-2 by FDA under an Emergency Use Authorization (EUA). This EUA will remain in effect (meaning  this test can be used) for the duration of the COVID-19 declaration under Section 564(b)(1) of the Act, 21 U.S.C. section 360bbb-3(b)(1), unless the authorization is terminated or revoked.  Performed at [facility], [address] [place], [place] [address]   Respiratory (~20 pathogens) panel by PCR     Status: None   Collection Time: 07/17/23  8:04 AM   Specimen: Nasopharyngeal Swab; Respiratory  Result Value Ref Range Status   Adenovirus NOT DETECTED NOT DETECTED Final   Coronavirus 229E NOT DETECTED NOT DETECTED Final    Comment: (NOTE) The Coronavirus on the Respiratory Panel, DOES NOT test for the novel  Coronavirus (2019 nCoV)    Coronavirus HKU1 NOT DETECTED NOT DETECTED Final   Coronavirus NL63 NOT DETECTED NOT DETECTED Final   Coronavirus OC43 NOT DETECTED NOT DETECTED Final   Metapneumovirus NOT DETECTED NOT DETECTED Final   Rhinovirus / Enterovirus NOT DETECTED NOT DETECTED Final   Influenza A NOT DETECTED NOT DETECTED Final   Influenza B NOT DETECTED  NOT DETECTED Final   Parainfluenza Virus 1 NOT DETECTED NOT DETECTED Final   Parainfluenza Virus 2 NOT DETECTED NOT DETECTED Final   Parainfluenza Virus 3 NOT DETECTED NOT DETECTED Final   Parainfluenza Virus 4 NOT DETECTED NOT DETECTED Final   Respiratory Syncytial Virus NOT DETECTED NOT DETECTED Final   Bordetella pertussis NOT DETECTED NOT DETECTED Final   Bordetella Parapertussis NOT DETECTED NOT DETECTED Final   Chlamydophila pneumoniae NOT DETECTED NOT DETECTED Final   Mycoplasma pneumoniae NOT DETECTED NOT DETECTED Final    Comment: Performed at [facility], [address] [place], [place] [address]    Labs: CBC: Recent Labs  Lab 07/16/23 2114 07/18/23 0509 07/19/23 0439 07/20/23 0402  WBC 11.6* 14.2* 10.8* 10.2  NEUTROABS 8.3*  --  7.7  --   HGB 14.7 14.1 13.3 13.8  HCT 43.3 42.5 39.3 41.2  MCV 93.3 95.5 94.2 95.6  PLT 35* 30* 21* 27*   Basic Metabolic Panel: Recent Labs  Lab  07/16/23 2114 07/18/23 0509 07/19/23 0439 07/20/23 0402  NA 133* 136 135 136  K 3.6 2.9* 3.6 3.4*  CL 95* 96* 97* 102  CO2 23 23 24 25   GLUCOSE 197* 156* 130* 115*  BUN 10 11 10 8   CREATININE 0.45 0.52 0.54 0.52  CALCIUM 8.8* 8.4* 8.2* 8.3*  MG  --   --   --  1.5*   Liver Function Tests: No results for input(s): AST, ALT, ALKPHOS, BILITOT, PROT, ALBUMIN in the last 168 hours. CBG: No results for input(s): GLUCAP in the last 168 hours.  Discharge time spent: greater than 30 minutes.  This record has been created using Conservation officer, historic buildings. Errors have been sought and corrected,but may not always be located. Such creation errors do not reflect on the standard of care.   Signed: Amaryllis Dare, MD [facility] 07/20/2023

## 2023-07-20 NOTE — Plan of Care (Signed)

## 2023-07-24 IMAGING — CT CT HEAD W/O CM
4 series · 14 of 47 positions shown, 16 images · non-contrast
Comparison: None.

CLINICAL DATA: 50-year-old female with head trauma.

EXAM:
CT HEAD WITHOUT CONTRAST
TECHNIQUE: Contiguous axial images were obtained from the base of the skull
through the vertex without intravenous contrast.

[Series 2: axial st head 5.00 ax · axial · 0.33mm/px · z∈[-502,-399]mm · 6 of 32 slices shown, 8 images]
[im 5/32  brain]
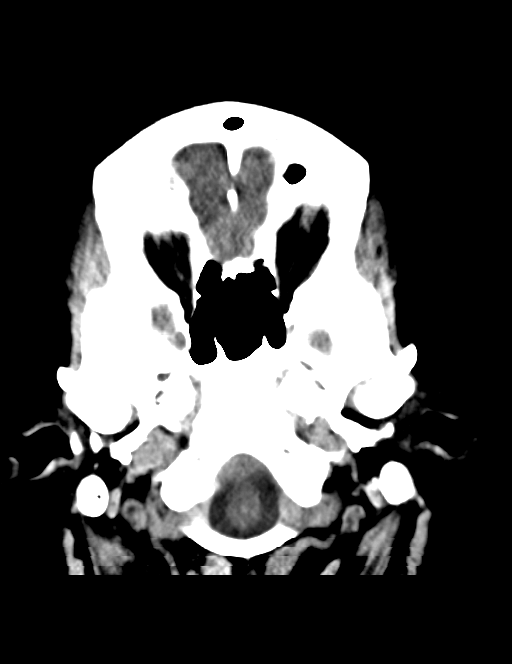
[im 5/32  bone]
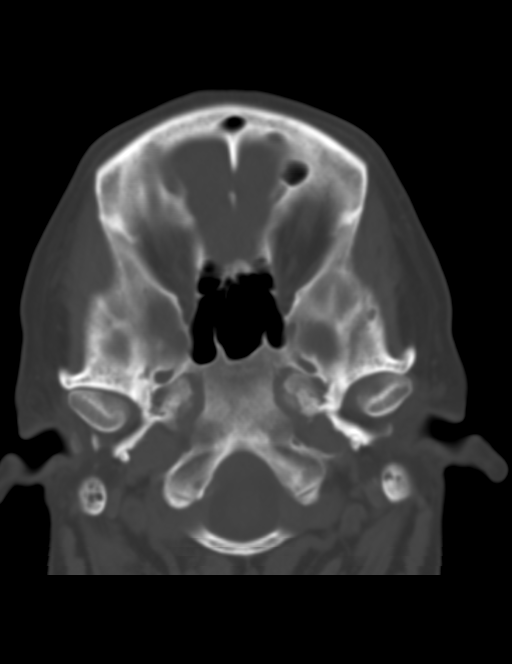
[im 9/32  brain]
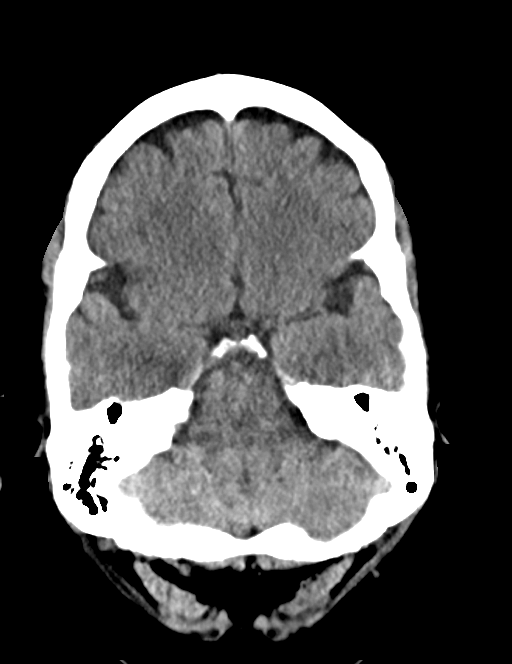
[im 14/32  brain]
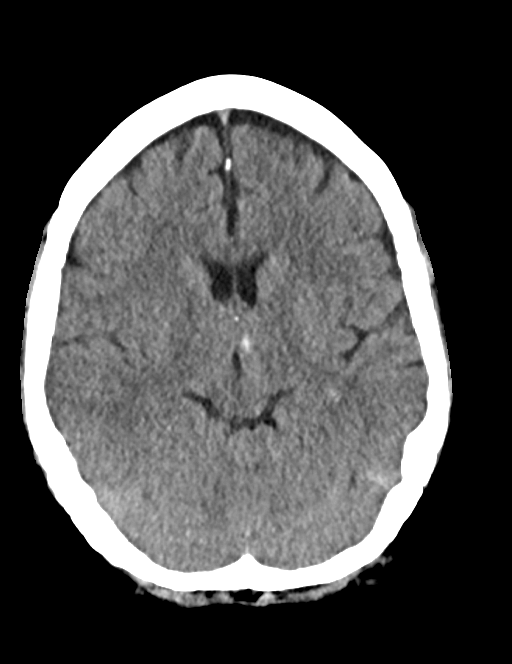
[im 18/32  brain]
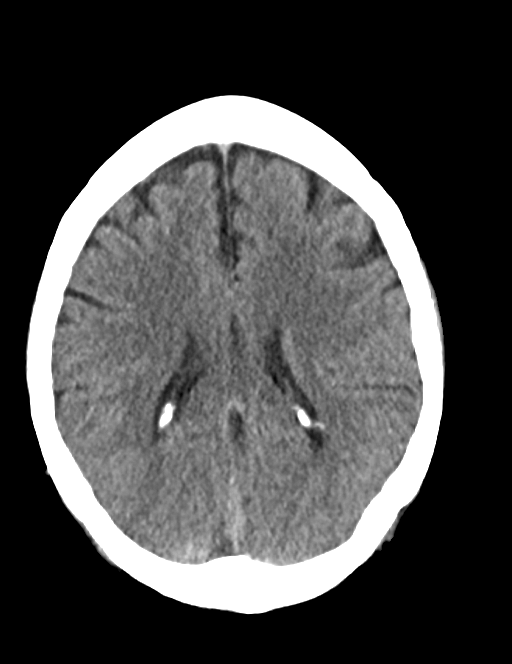
[im 23/32  brain]
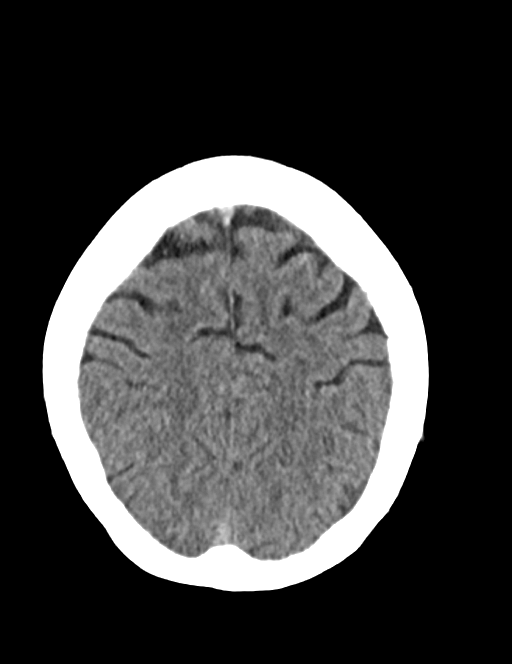
[im 23/32  bone]
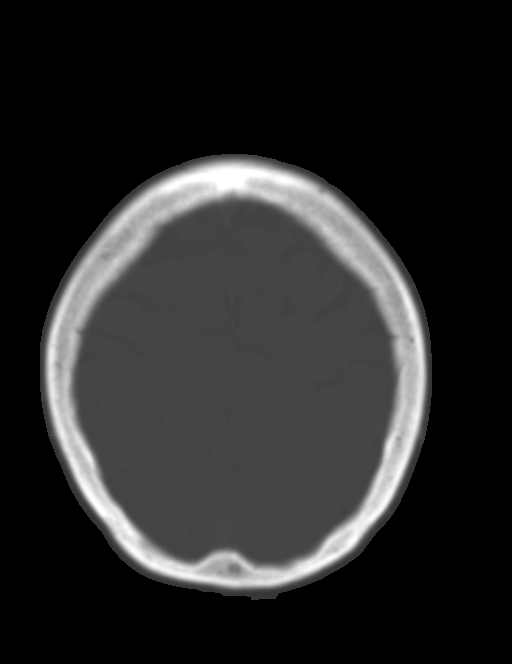
[im 27/32  brain]
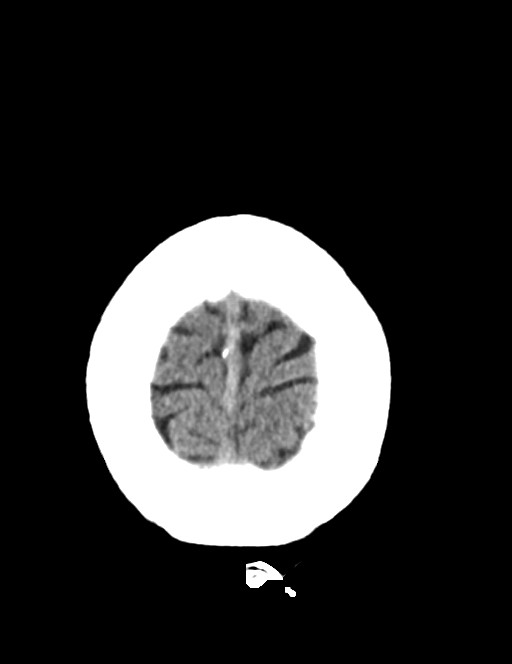

[Series 4: axial bone head 2.00 ax · axial · 0.33mm/px · z∈[-509,-494]mm · 2 of 81 slices shown]
[im 8/81  bone]
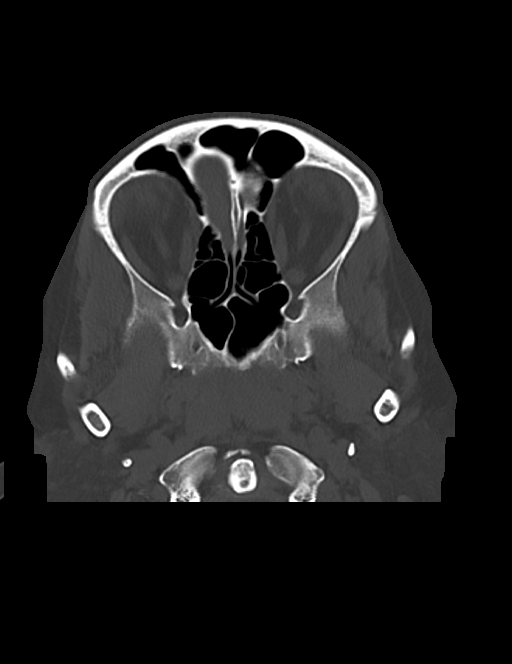
[im 16/81  bone]
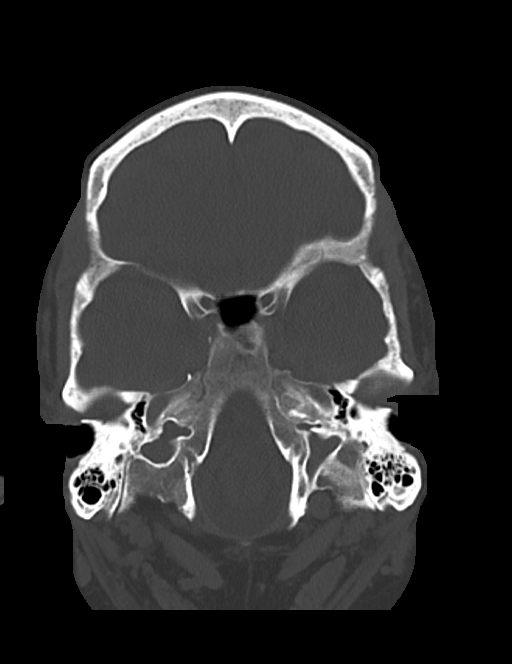

[Series 6: coronals head 3.00 cor · coronal · 0.32mm/px · 3 of 74 slices shown]
[im 25/74  brain]
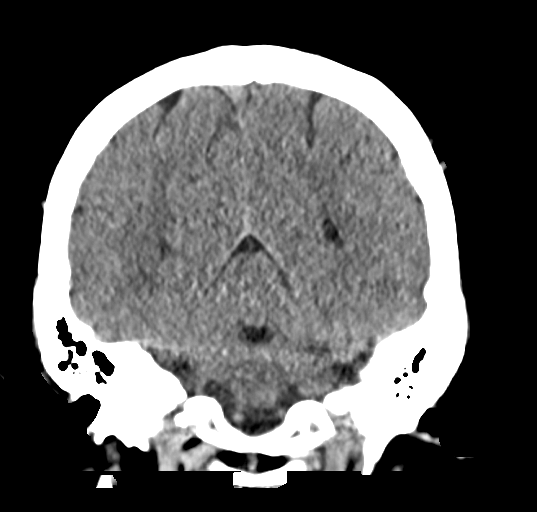
[im 33/74  brain]
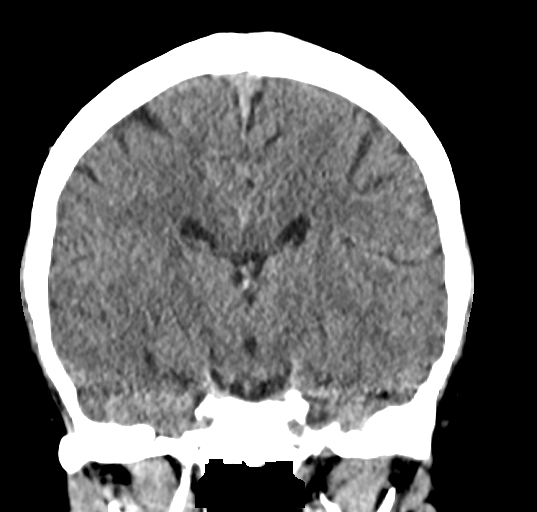
[im 41/74  brain]
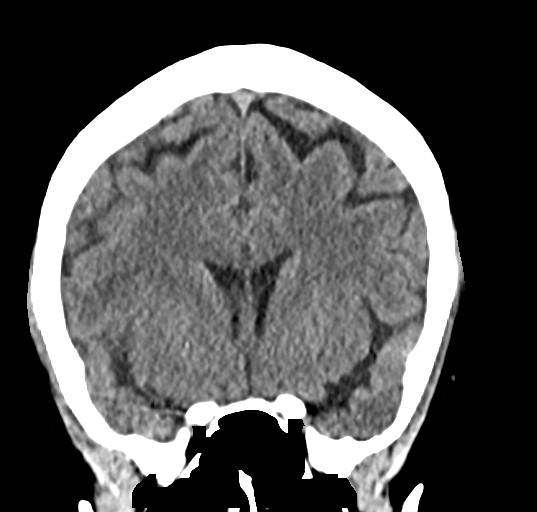

[Series 8: sagittals head 3.00 sag · sagittal · 0.32mm/px · 3 of 57 slices shown]
[im 19/57  brain]
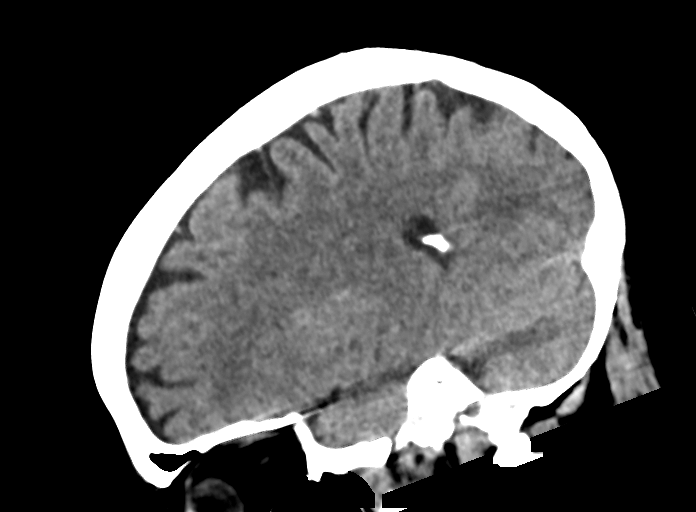
[im 29/57  brain]
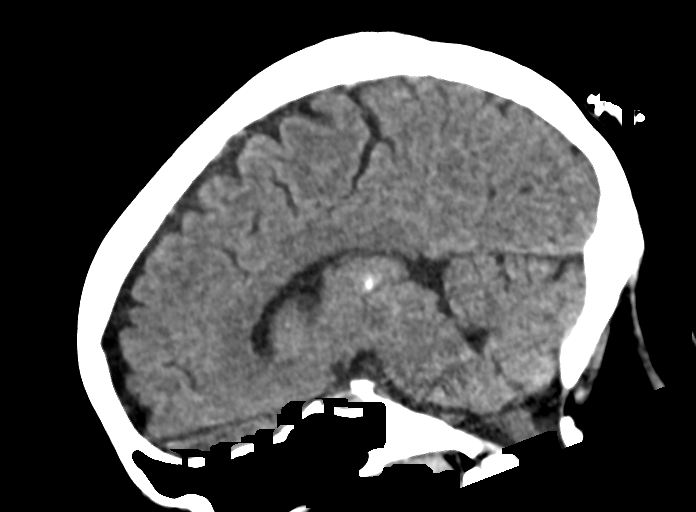
[im 38/57  brain]
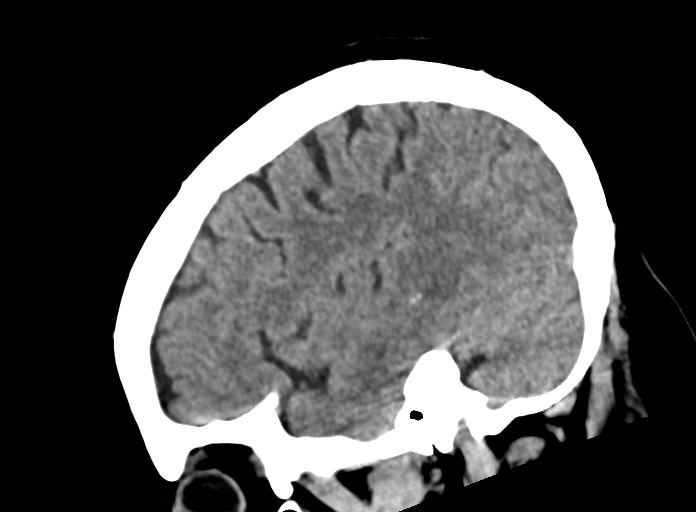

[14 of 47 positions shown; findings below may reference images not displayed]

FINDINGS: Brain: The ventricles and sulci appropriate size for patient's age.
The gray-white matter discrimination is preserved. There is no acute
intracranial hemorrhage. No mass effect or midline shift. No
extra-axial fluid collection. A 1 cm focus of calcification in the
left thalamus adjacent to the third ventricle likely sequela of
prior inflammatory/infectious process or a calcified cavernoma.

Vascular: No hyperdense vessel or unexpected calcification.

Skull: Normal. Negative for fracture or focal lesion.

Sinuses/Orbits: No acute finding.

Other: None.
IMPRESSION: 1. No acute intracranial pathology.
2. A 1 cm focus of calcification in the left thalamus likely sequela
of prior insult or a calcified cavernoma.

## 2023-08-02 ENCOUNTER — Other Ambulatory Visit: Payer: Self-pay

## 2023-08-02 ENCOUNTER — Telehealth: Payer: BC Managed Care – PPO | Admitting: Emergency Medicine

## 2023-08-02 ENCOUNTER — Emergency Department: Payer: BC Managed Care – PPO

## 2023-08-02 ENCOUNTER — Inpatient Hospital Stay
Admission: EM | Admit: 2023-08-02 | Discharge: 2023-08-06 | DRG: 193 | Disposition: A | Discharge status: Home / Self Care | Payer: BC Managed Care – PPO

## 2023-08-02 DIAGNOSIS — F17210 Nicotine dependence, cigarettes, uncomplicated: Secondary | ICD-10-CM | POA: Diagnosis present

## 2023-08-02 DIAGNOSIS — J111 Influenza due to unidentified influenza virus with other respiratory manifestations: Principal | ICD-10-CM

## 2023-08-02 DIAGNOSIS — F17200 Nicotine dependence, unspecified, uncomplicated: Secondary | ICD-10-CM | POA: Diagnosis present

## 2023-08-02 DIAGNOSIS — J101 Influenza due to other identified influenza virus with other respiratory manifestations: Principal | ICD-10-CM | POA: Diagnosis present

## 2023-08-02 DIAGNOSIS — Z6841 Body Mass Index (BMI) 40.0 and over, adult: Secondary | ICD-10-CM | POA: Diagnosis not present

## 2023-08-02 DIAGNOSIS — J441 Chronic obstructive pulmonary disease with (acute) exacerbation: Secondary | ICD-10-CM | POA: Diagnosis present

## 2023-08-02 DIAGNOSIS — Z833 Family history of diabetes mellitus: Secondary | ICD-10-CM

## 2023-08-02 DIAGNOSIS — Z79899 Other long term (current) drug therapy: Secondary | ICD-10-CM

## 2023-08-02 DIAGNOSIS — F32A Depression, unspecified: Secondary | ICD-10-CM | POA: Diagnosis present

## 2023-08-02 DIAGNOSIS — J439 Emphysema, unspecified: Secondary | ICD-10-CM | POA: Diagnosis present

## 2023-08-02 DIAGNOSIS — Z8249 Family history of ischemic heart disease and other diseases of the circulatory system: Secondary | ICD-10-CM | POA: Diagnosis not present

## 2023-08-02 DIAGNOSIS — Z823 Family history of stroke: Secondary | ICD-10-CM

## 2023-08-02 DIAGNOSIS — J440 Chronic obstructive pulmonary disease with acute lower respiratory infection: Secondary | ICD-10-CM | POA: Diagnosis present

## 2023-08-02 DIAGNOSIS — J329 Chronic sinusitis, unspecified: Secondary | ICD-10-CM

## 2023-08-02 DIAGNOSIS — Z83438 Family history of other disorder of lipoprotein metabolism and other lipidemia: Secondary | ICD-10-CM | POA: Diagnosis not present

## 2023-08-02 DIAGNOSIS — R7401 Elevation of levels of liver transaminase levels: Secondary | ICD-10-CM | POA: Diagnosis present

## 2023-08-02 DIAGNOSIS — D693 Immune thrombocytopenic purpura: Secondary | ICD-10-CM | POA: Diagnosis present

## 2023-08-02 DIAGNOSIS — J9601 Acute respiratory failure with hypoxia: Secondary | ICD-10-CM | POA: Diagnosis present

## 2023-08-02 DIAGNOSIS — Z818 Family history of other mental and behavioral disorders: Secondary | ICD-10-CM | POA: Diagnosis not present

## 2023-08-02 DIAGNOSIS — E66813 Obesity, class 3: Secondary | ICD-10-CM | POA: Diagnosis present

## 2023-08-02 DIAGNOSIS — Z720 Tobacco use: Secondary | ICD-10-CM | POA: Insufficient documentation

## 2023-08-02 DIAGNOSIS — J209 Acute bronchitis, unspecified: Secondary | ICD-10-CM | POA: Diagnosis present

## 2023-08-02 DIAGNOSIS — F419 Anxiety disorder, unspecified: Secondary | ICD-10-CM | POA: Diagnosis present

## 2023-08-02 DIAGNOSIS — I10 Essential (primary) hypertension: Secondary | ICD-10-CM | POA: Diagnosis present

## 2023-08-02 DIAGNOSIS — Z888 Allergy status to other drugs, medicaments and biological substances status: Secondary | ICD-10-CM | POA: Diagnosis not present

## 2023-08-02 DIAGNOSIS — IMO0001 Reserved for inherently not codable concepts without codable children: Secondary | ICD-10-CM | POA: Diagnosis present

## 2023-08-02 DIAGNOSIS — D696 Thrombocytopenia, unspecified: Secondary | ICD-10-CM | POA: Diagnosis present

## 2023-08-02 LAB — COMPREHENSIVE METABOLIC PANEL
ALT: 49 U/L — ABNORMAL HIGH (ref 0–44)
AST: 115 U/L — ABNORMAL HIGH (ref 15–41)
Albumin: 3.3 g/dL — ABNORMAL LOW (ref 3.5–5.0)
Alkaline Phosphatase: 193 U/L — ABNORMAL HIGH (ref 38–126)
Anion gap: 13 (ref 5–15)
BUN: <5 mg/dL — ABNORMAL LOW (ref 6–20)
CO2: 24 mmol/L (ref 22–32)
Calcium: 8.9 mg/dL (ref 8.9–10.3)
Chloride: 96 mmol/L — ABNORMAL LOW (ref 98–111)
Creatinine, Ser: 0.75 mg/dL (ref 0.44–1.00)
GFR, Estimated: >60 mL/min (ref >60)
Glucose, Bld: 178 mg/dL — ABNORMAL HIGH (ref 70–99)
Potassium: 4.4 mmol/L (ref 3.5–5.1)
Sodium: 133 mmol/L — ABNORMAL LOW (ref 135–145)
Total Bilirubin: 0.7 mg/dL (ref 0.0–1.2)
Total Protein: 7.3 g/dL (ref 6.5–8.1)

## 2023-08-02 LAB — CBC
HCT: 44.5 % (ref 36.0–46.0)
Hemoglobin: 14.7 g/dL (ref 12.0–15.0)
MCH: 31.1 pg (ref 26.0–34.0)
MCHC: 33 g/dL (ref 30.0–36.0)
MCV: 94.3 fL (ref 80.0–100.0)
Platelets: 122 10*3/uL — ABNORMAL LOW (ref 150–400)
RBC: 4.72 MIL/uL (ref 3.87–5.11)
RDW: 15.6 % — ABNORMAL HIGH (ref 11.5–15.5)
WBC: 11.5 10*3/uL — ABNORMAL HIGH (ref 4.0–10.5)
nRBC: 0 % (ref 0.0–0.2)

## 2023-08-02 LAB — RESP PANEL BY RT-PCR (RSV, FLU A&B, COVID)  RVPGX2
Influenza A by PCR: POSITIVE — AB
Influenza B by PCR: NEGATIVE
Resp Syncytial Virus by PCR: NEGATIVE
SARS Coronavirus 2 by RT PCR: NEGATIVE

## 2023-08-02 MED ORDER — BENZONATATE 100 MG PO CAPS
100.0000 mg | ORAL_CAPSULE | Freq: Once | ORAL | Status: AC
  Administered 2023-08-02: 100 mg via ORAL
  Filled 2023-08-02: qty 1

## 2023-08-02 MED ORDER — METHYLPREDNISOLONE SODIUM SUCC 125 MG IJ SOLR
125.0000 mg | Freq: Once | INTRAMUSCULAR | Status: AC
Start: 2023-08-02 — End: 2023-08-02
  Administered 2023-08-02: 125 mg via INTRAVENOUS
  Filled 2023-08-02: qty 2

## 2023-08-02 MED ORDER — IPRATROPIUM-ALBUTEROL 0.5-2.5 (3) MG/3ML IN SOLN
9.0000 mL | Freq: Once | RESPIRATORY_TRACT | Status: AC
  Administered 2023-08-02: 9 mL via RESPIRATORY_TRACT
  Filled 2023-08-02: qty 9

## 2023-08-02 MED ORDER — AMOXICILLIN-POT CLAVULANATE 875-125 MG PO TABS: 1.0000 | ORAL_TABLET | Freq: Two times a day (BID) | ORAL | 0 refills | Status: DC

## 2023-08-02 MED ORDER — GUAIFENESIN ER 600 MG PO TB12
600.0000 mg | ORAL_TABLET | Freq: Two times a day (BID) | ORAL | Status: DC
  Administered 2023-08-02 – 2023-08-06 (×8): 600 mg via ORAL
  Filled 2023-08-02 (×8): qty 1

## 2023-08-02 MED ORDER — AMLODIPINE BESYLATE 5 MG PO TABS: 10.0000 mg | ORAL_TABLET | Freq: Every day | ORAL | Status: DC

## 2023-08-02 MED ORDER — IPRATROPIUM-ALBUTEROL 0.5-2.5 (3) MG/3ML IN SOLN
3.0000 mL | Freq: Four times a day (QID) | RESPIRATORY_TRACT | Status: DC
  Administered 2023-08-02 – 2023-08-06 (×14): 3 mL via RESPIRATORY_TRACT
  Filled 2023-08-02 (×14): qty 3

## 2023-08-02 MED ORDER — HYDROCODONE-ACETAMINOPHEN 5-325 MG PO TABS
1.0000 | ORAL_TABLET | ORAL | Status: DC | PRN
Start: 2023-08-02 — End: 2023-08-06
  Administered 2023-08-03 – 2023-08-05 (×5): 2 via ORAL
  Filled 2023-08-02 (×5): qty 2

## 2023-08-02 MED ORDER — PREDNISONE 20 MG PO TABS
40.0000 mg | ORAL_TABLET | Freq: Every day | ORAL | Status: DC
  Administered 2023-08-04 – 2023-08-06 (×3): 40 mg via ORAL
  Filled 2023-08-02 (×3): qty 2

## 2023-08-02 MED ORDER — OSELTAMIVIR PHOSPHATE 75 MG PO CAPS
75.0000 mg | ORAL_CAPSULE | Freq: Two times a day (BID) | ORAL | Status: DC
  Administered 2023-08-03 – 2023-08-06 (×7): 75 mg via ORAL
  Filled 2023-08-02 (×8): qty 1

## 2023-08-02 MED ORDER — LACTATED RINGERS IV BOLUS
500.0000 mL | Freq: Once | INTRAVENOUS | Status: AC
  Administered 2023-08-02: 500 mL via INTRAVENOUS

## 2023-08-02 MED ORDER — ESCITALOPRAM OXALATE 10 MG PO TABS: 20.0000 mg | ORAL_TABLET | Freq: Every day | ORAL | Status: DC

## 2023-08-02 MED ORDER — LAMOTRIGINE 25 MG PO TABS: 25.0000 mg | ORAL_TABLET | Freq: Every day | ORAL | Status: DC

## 2023-08-02 MED ORDER — NICOTINE POLACRILEX 2 MG MT GUM
2.0000 mg | CHEWING_GUM | OROMUCOSAL | Status: DC | PRN
  Administered 2023-08-03: 2 mg via ORAL
  Filled 2023-08-02: qty 1

## 2023-08-02 MED ORDER — NICOTINE 21 MG/24HR TD PT24
21.0000 mg | MEDICATED_PATCH | Freq: Once | TRANSDERMAL | Status: AC
  Administered 2023-08-02: 21 mg via TRANSDERMAL
  Filled 2023-08-02: qty 1

## 2023-08-02 MED ORDER — OSELTAMIVIR PHOSPHATE 75 MG PO CAPS
75.0000 mg | ORAL_CAPSULE | Freq: Once | ORAL | Status: AC
  Administered 2023-08-02: 75 mg via ORAL
  Filled 2023-08-02: qty 1

## 2023-08-02 MED ORDER — ACETAMINOPHEN 650 MG RE SUPP: 650.0000 mg | Freq: Four times a day (QID) | RECTAL | Status: DC | PRN

## 2023-08-02 MED ORDER — ARIPIPRAZOLE 2 MG PO TABS: 2.0000 mg | ORAL_TABLET | Freq: Two times a day (BID) | ORAL | Status: DC

## 2023-08-02 MED ORDER — ENOXAPARIN SODIUM 60 MG/0.6ML IJ SOSY
50.0000 mg | PREFILLED_SYRINGE | INTRAMUSCULAR | Status: DC
  Administered 2023-08-02 – 2023-08-05 (×4): 50 mg via SUBCUTANEOUS
  Filled 2023-08-02 (×4): qty 0.6

## 2023-08-02 MED ORDER — ACETAMINOPHEN 325 MG PO TABS
650.0000 mg | ORAL_TABLET | Freq: Four times a day (QID) | ORAL | Status: DC | PRN
  Administered 2023-08-03 – 2023-08-06 (×5): 650 mg via ORAL
  Filled 2023-08-02 (×5): qty 2

## 2023-08-02 MED ORDER — BENZONATATE 100 MG PO CAPS
100.0000 mg | ORAL_CAPSULE | Freq: Three times a day (TID) | ORAL | Status: DC | PRN
  Administered 2023-08-03 – 2023-08-06 (×6): 200 mg via ORAL
  Filled 2023-08-02 (×6): qty 2

## 2023-08-02 MED ORDER — TRAZODONE HCL 50 MG PO TABS
50.0000 mg | ORAL_TABLET | Freq: Every evening | ORAL | Status: DC | PRN
  Administered 2023-08-02 – 2023-08-05 (×4): 50 mg via ORAL
  Filled 2023-08-02 (×4): qty 1

## 2023-08-02 MED ORDER — ALBUTEROL SULFATE (2.5 MG/3ML) 0.083% IN NEBU: 2.5000 mg | INHALATION_SOLUTION | Freq: Four times a day (QID) | RESPIRATORY_TRACT | 1 refills | Status: DC | PRN

## 2023-08-02 MED ORDER — ACETAMINOPHEN 500 MG PO TABS
1000.0000 mg | ORAL_TABLET | Freq: Once | ORAL | Status: AC
  Administered 2023-08-02: 1000 mg via ORAL
  Filled 2023-08-02: qty 2

## 2023-08-02 MED ORDER — NICOTINE 14 MG/24HR TD PT24: 14.0000 mg | MEDICATED_PATCH | Freq: Every day | TRANSDERMAL | Status: DC

## 2023-08-02 MED ORDER — METOPROLOL SUCCINATE ER 50 MG PO TB24: 100.0000 mg | ORAL_TABLET | Freq: Every day | ORAL | Status: DC

## 2023-08-02 MED ORDER — METHYLPREDNISOLONE SODIUM SUCC 40 MG IJ SOLR
40.0000 mg | Freq: Two times a day (BID) | INTRAMUSCULAR | Status: AC
  Administered 2023-08-03 (×2): 40 mg via INTRAVENOUS
  Filled 2023-08-02 (×2): qty 1

## 2023-08-02 MED ORDER — ALBUTEROL SULFATE (2.5 MG/3ML) 0.083% IN NEBU
2.5000 mg | INHALATION_SOLUTION | RESPIRATORY_TRACT | Status: DC | PRN
  Administered 2023-08-06: 2.5 mg via RESPIRATORY_TRACT
  Filled 2023-08-02: qty 3

## 2023-08-02 NOTE — Assessment & Plan Note (Signed)
Complicating factor to overall prognosis and care 

## 2023-08-02 NOTE — H&P (Signed)
History and Physical    Patient: Carly [name] DOB: 1971/02/05 DOA: 08/02/2023 DOS: the patient was seen and examined on 08/02/2023 PCP: Dana Allan, MD  Patient coming from: Home  Chief Complaint:  Chief Complaint  Patient presents with   Shortness of Breath    HPI: Carly [name] is a 53 y.o. female with medical history significant for Morbid obesity asthma/COPD, tobacco use disorder, chronic ITP, anxiety/depression, coming in with a 2-day history of cough, wheezing, SOB, getting no relief with home nebulizer treatment.  Denies chest pain, orthopnea, lower extremity pain or swelling. ED course and data review: Low-grade temp of 99.3 tachycardic to 112 and tachypneic to 22.  Desatted while in the ED to 88% on room air. Labs notable for WBC 11,000 and influenza A positive.2 Mild thrombocytopenia of 122000 up from 27,000 a couple weeks prior.  Mild transaminitis EKG showing sinus tachycardia 136 with nonspecific T wave abnormalities Chest x-ray nonacute  Patient treated with an LR bolus DuoNebs methylprednisolone and given Tessalon for cough.  Started on Tamiflu placed on a nicotine patch  Hospitalist consulted for admission.   Review of Systems: As mentioned in the history of present illness. All other systems reviewed and are negative.  Past Medical History:  Diagnosis Date   Abnormal uterine bleeding (AUB) 08/26/2018   Anxiety    C. difficile diarrhea    07/04/21   Chicken pox    Depression    Depression, major, single episode, moderate (HCC) 08/04/2018   Diarrhea 06/07/2022   Dyspnea on exertion 05/24/2022   Elevated testosterone level in female    Emphysema of lung (HCC)    bronchitis   Frequent headaches    Generalized anxiety disorder 08/23/2014   Hay fever    Hypertension    Idiopathic thrombocytopenic purpura (ITP) (HCC)    Iron deficiency 09/02/2017   Positive ANA (antinuclear antibody)    Thrombocytopenia (HCC) 02/12/2016   Past Surgical  History:  Procedure Laterality Date   HEMATOMA EVACUATION N/A 04/07/2020   Procedure: EVACUATION HEMATOMA  AND APPLICATION OF PRESSURE DRESSING;  Surgeon: Christeen Douglas, MD;  Location: [facility];  Service: Gynecology;  Laterality: N/A;   HYSTEROSCOPY WITH NOVASURE N/A 02/21/2018   Procedure: HYSTEROSCOPY WITH NOVASURE, POLYPECTOMY;  Surgeon: Christeen Douglas, MD;  Location: [facility];  Service: Gynecology;  Laterality: N/A;   TUBAL LIGATION  1997   TUBAL LIGATION     Social History:  reports that she has been smoking cigarettes. She has a 42 pack-year smoking history. She has never used smokeless tobacco. She reports current alcohol use of about 8.0 standard drinks of alcohol per week. She reports that she does not use drugs.  Allergies  Allergen Reactions   Wellbutrin [Bupropion]     Crazy    Meloxicam Nausea Only    Family History  Problem Relation Age of Onset   Hyperlipidemia Mother    Heart disease Mother    Stroke Mother    Depression Mother    Mental illness Mother    Heart attack Mother 76   Hyperlipidemia Father    Depression Father    Mental illness Father    Diabetes Paternal Grandfather    Cancer Cousin     Prior to Admission medications   Medication Sig Start Date End Date Taking? Authorizing Provider  acetaminophen (TYLENOL) 500 MG tablet Take 500-1,000 mg by mouth every 6 (six) hours as needed for mild pain or headache.    [provider]  albuterol (PROVENTIL) (  2.5 MG/3ML) 0.083% nebulizer solution Take 3 mLs (2.5 mg total) by nebulization every 6 (six) hours as needed for wheezing or shortness of breath. 08/02/23   Roxy Horseman, PA-C  albuterol (VENTOLIN HFA) 108 (90 Base) MCG/ACT inhaler Inhale 2 puffs into the lungs every 6 (six) hours as needed for wheezing or shortness of breath. 07/07/23   Delorse Lek, FNP  amLODipine (NORVASC) 10 MG tablet Take 10 mg by mouth daily. 04/24/23   [provider]  amoxicillin-clavulanate (AUGMENTIN)  875-125 MG tablet Take 1 tablet by mouth every 12 (twelve) hours. 08/02/23   Roxy Horseman, PA-C  ARIPiprazole (ABILIFY) 2 MG tablet Take 2 tablets (4 mg total) by mouth daily. Dc rexulti not covered Patient taking differently: Take 2 mg by mouth 2 (two) times daily. 02/23/23   Dana Allan, MD  benzonatate (TESSALON) 100 MG capsule Take 1-2 capsules (100-200 mg total) by mouth 3 (three) times daily as needed. 06/24/23   Freddy Finner, NP  BREZTRI AEROSPHERE 160-9-4.8 MCG/ACT AERO INHALE 2 PUFFS INTO THE LUNGS IN THE MORNING AND AT BEDTIME. 03/11/23   Salena Saner, MD  cyanocobalamin (VITAMIN B12) 1000 MCG/ML injection INJECT 1 ML (1,000 MCG TOTAL) INTO THE MUSCLE EVERY 30 DAYS. 08/06/22   Dana Allan, MD  D3-50 1.25 MG (50000 UT) capsule Take 50,000 Units by mouth once a week. 04/24/23   [provider]  escitalopram (LEXAPRO) 20 MG tablet Take 1 tablet (20 mg total) by mouth daily. 02/23/23   Dana Allan, MD  lamoTRIgine (LAMICTAL) 25 MG tablet Take 25 mg by mouth daily. 06/18/23   [provider]  metoprolol succinate (TOPROL-XL) 100 MG 24 hr tablet TAKE 1 TABLET BY MOUTH DAILY. TAKE WITH OR IMMEDIATELY FOLLOWING A MEAL. 05/21/23   Dana Allan, MD  nicotine (NICODERM CQ - DOSED IN MG/24 HOURS) 14 mg/24hr patch Place 1 patch (14 mg total) onto the skin daily. 03/28/23   Leeroy Bock, MD  omeprazole (PRILOSEC) 20 MG capsule Take 1 capsule (20 mg total) by mouth daily. 02/11/23   Dana Allan, MD  oxybutynin (DITROPAN-XL) 5 MG 24 hr tablet TAKE 1 TABLET BY MOUTH EVERYDAY AT BEDTIME 05/22/23   Dana Allan, MD  traZODone (DESYREL) 50 MG tablet TAKE 0.5-1 TABLETS BY MOUTH AT BEDTIME AS NEEDED FOR SLEEP. 02/23/23   Dana Allan, MD  Varenicline Tartrate, Starter, (CHANTIX STARTING MONTH PAK) 0.5 MG X 11 & 1 MG X 42 TBPK Take as directed in the package that this is a starter pack. 07/04/23   Salena Saner, MD    Physical Exam: Vitals:   08/02/23 1333 08/02/23 1337  08/02/23 1950  BP:  (!) 142/68 (!) 147/70  Pulse:  (!) 112 98  Resp:  (!) 22 20  Temp:  99.3 F (37.4 C) 98.7 F (37.1 C)  TempSrc:  Oral   SpO2:  94% 100%  Weight: 101.4 kg    Height: 5\' 2"  (1.575 m)     Physical Exam Vitals and nursing note reviewed.  Constitutional:      General: She is not in acute distress.    Interventions: Nasal cannula in place.     Comments: Conversational dyspnea  HENT:     Head: Normocephalic and atraumatic.  Cardiovascular:     Rate and Rhythm: Regular rhythm. Tachycardia present.     Heart sounds: Normal heart sounds.  Pulmonary:     Effort: Tachypnea present.     Breath sounds: Wheezing and rhonchi present.  Abdominal:  Palpations: Abdomen is soft.     Tenderness: There is no abdominal tenderness.  Neurological:     Mental Status: Mental status is at baseline.     Labs on Admission: I have personally reviewed following labs and imaging studies  CBC: Recent Labs  Lab 08/02/23 1336  WBC 11.5*  HGB 14.7  HCT 44.5  MCV 94.3  PLT 122*   Basic Metabolic Panel: Recent Labs  Lab 08/02/23 1336  NA 133*  K 4.4  CL 96*  CO2 24  GLUCOSE 178*  BUN <5*  CREATININE 0.75  CALCIUM 8.9   GFR: Estimated Creatinine Clearance: 91.7 mL/min (by C-G formula based on SCr of 0.75 mg/dL). Liver Function Tests: Recent Labs  Lab 08/02/23 1336  AST 115*  ALT 49*  ALKPHOS 193*  BILITOT 0.7  PROT 7.3  ALBUMIN 3.3*   No results for input(s): "LIPASE", "AMYLASE" in the last 168 hours. No results for input(s): "AMMONIA" in the last 168 hours. Coagulation Profile: No results for input(s): "INR", "PROTIME" in the last 168 hours. Cardiac Enzymes: No results for input(s): "CKTOTAL", "CKMB", "CKMBINDEX", "TROPONINI" in the last 168 hours. BNP (last 3 results) No results for input(s): "PROBNP" in the last 8760 hours. HbA1C: No results for input(s): "HGBA1C" in the last 72 hours. CBG: No results for input(s): "GLUCAP" in the last 168  hours. Lipid Profile: No results for input(s): "CHOL", "HDL", "LDLCALC", "TRIG", "CHOLHDL", "LDLDIRECT" in the last 72 hours. Thyroid Function Tests: No results for input(s): "TSH", "T4TOTAL", "FREET4", "T3FREE", "THYROIDAB" in the last 72 hours. Anemia Panel: No results for input(s): "VITAMINB12", "FOLATE", "FERRITIN", "TIBC", "IRON", "RETICCTPCT" in the last 72 hours. Urine analysis:    Component Value Date/Time   COLORURINE YELLOW (A) 09/08/2022 2143   APPEARANCEUR HAZY (A) 09/08/2022 2143   APPEARANCEUR Hazy (A) 07/17/2016 1438   LABSPEC 1.025 09/08/2022 2143   LABSPEC 1.002 10/06/2012 1138   PHURINE 5.0 09/08/2022 2143   GLUCOSEU 50 (A) 09/08/2022 2143   GLUCOSEU Negative 10/06/2012 1138   HGBUR MODERATE (A) 09/08/2022 2143   BILIRUBINUR Negative 03/20/2023 1402   BILIRUBINUR Negative 07/17/2016 1438   BILIRUBINUR Negative 10/06/2012 1138   KETONESUR 20 (A) 09/08/2022 2143   PROTEINUR Negative 03/20/2023 1402   PROTEINUR 30 (A) 09/08/2022 2143   UROBILINOGEN 0.2 03/20/2023 1402   NITRITE Negative 03/20/2023 1402   NITRITE NEGATIVE 09/08/2022 2143   LEUKOCYTESUR Negative 03/20/2023 1402   LEUKOCYTESUR NEGATIVE 09/08/2022 2143   LEUKOCYTESUR Negative 10/06/2012 1138    Radiological Exams on Admission: DG Chest 2 View Result Date: 08/02/2023 CLINICAL DATA:  Shortness of breath. EXAM: CHEST - 2 VIEW COMPARISON:  07/16/2023. FINDINGS: Bilateral lung fields are clear. Bilateral costophrenic angles are clear. Normal cardio-mediastinal silhouette. No acute osseous abnormalities. The soft tissues are within normal limits. IMPRESSION: No active cardiopulmonary disease. Electronically Signed   By: Jules Schick M.D.   On: 08/02/2023 15:22     Data Reviewed: Relevant notes from primary care and specialist visits, past discharge summaries as available in EHR, including Care Everywhere. Prior diagnostic testing as pertinent to current admission diagnoses Updated medications and  problem lists for reconciliation ED course, including vitals, labs, imaging, treatment and response to treatment Triage notes, nursing and pharmacy notes and ED provider's notes Notable results as noted in HPI   Assessment and Plan: * Influenza A COPD with acute bronchitis Acute respiratory failure with hypoxia Continue Tamiflu Scheduled and as needed nebulized bronchodilators and IV steroids Antitussives, flutter valve and incentive spirometer Supplemental  oxygen to keep sats over 90  Transaminitis Mild elevation in AST ALT and alk phos Monitor  Essential hypertension Continue metoprolol and amlodipine  Anxiety and depression Continue trazodone and escitalopram, Abilify  Chronic ITP (idiopathic thrombocytopenic purpura) (HCC) Platelets 122,000, improved from a couple weeks prior when it was 27,000  Obesity, Class III, BMI 40-49.9 (morbid obesity) (HCC) Complicating factor to overall prognosis and care  Tobacco use disorder NicoDerm patch    DVT prophylaxis: Lovenox  Consults: none  Advance Care Planning:   Code Status: Prior   Family Communication: none  Disposition Plan: Back to previous home environment  Severity of Illness: The appropriate patient status for this patient is INPATIENT. Inpatient status is judged to be reasonable and necessary in order to provide the required intensity of service to ensure the patient's safety. The patient's presenting symptoms, physical exam findings, and initial radiographic and laboratory data in the context of their chronic comorbidities is felt to place them at high risk for further clinical deterioration. Furthermore, it is not anticipated that the patient will be medically stable for discharge from the hospital within 2 midnights of admission.   * I certify that at the point of admission it is my clinical judgment that the patient will require inpatient hospital care spanning beyond 2 midnights from the point of admission due  to high intensity of service, high risk for further deterioration and high frequency of surveillance required.*  Author: Andris Baumann, MD 08/02/2023 8:57 PM  For on call review [email or web address].

## 2023-08-02 NOTE — ED Provider Notes (Signed)
[facility] Provider Note    Event Date/Time   First MD Initiated Contact with Patient 08/02/23 1644     (approximate)   History   Shortness of Breath   HPI  Carly [name] is a 53 y.o. female past medical history significant for COPD who presents to the emergency department shortness of breath.  Endorses cough and shortness of breath that has been ongoing for the past 2 days.  Progressively worsening despite home albuterol treatments.  Endorses daily tobacco use but states that she has not been smoking cigarettes over the past couple of days.  Denies any history of DVT or PE.  No nausea or vomiting.  Does endorse some diarrhea.     Physical Exam   Triage Vital Signs: ED Triage Vitals  Encounter Vitals Group     BP 08/02/23 1337 (!) 142/68     Systolic BP Percentile --      Diastolic BP Percentile --      Pulse Rate 08/02/23 1337 (!) 112     Resp 08/02/23 1337 (!) 22     Temp 08/02/23 1337 99.3 F (37.4 C)     Temp Source 08/02/23 1337 Oral     SpO2 08/02/23 1337 94 %     Weight 08/02/23 1333 223 lb 8.7 oz (101.4 kg)     Height 08/02/23 1333 5\' 2"  (1.575 m)     Head Circumference --      Peak Flow --      Pain Score 08/02/23 1333 0     Pain Loc --      Pain Education --      Exclude from Growth Chart --     Most recent vital signs: Vitals:   08/02/23 1337  BP: (!) 142/68  Pulse: (!) 112  Resp: (!) 22  Temp: 99.3 F (37.4 C)  SpO2: 94%    Physical Exam Constitutional:      Appearance: She is well-developed.  HENT:     Head: Atraumatic.  Eyes:     Conjunctiva/sclera: Conjunctivae normal.  Cardiovascular:     Rate and Rhythm: Regular rhythm.  Pulmonary:     Effort: Tachypnea present. No respiratory distress.     Breath sounds: Wheezing and rhonchi present.  Abdominal:     General: There is no distension.     Palpations: Abdomen is soft.     Tenderness: There is no abdominal tenderness.  Musculoskeletal:        General:  Normal range of motion.     Cervical back: Normal range of motion.     Right lower leg: No edema.     Left lower leg: No edema.  Skin:    General: Skin is warm.     Capillary Refill: Capillary refill takes less than 2 seconds.  Neurological:     General: No focal deficit present.     Mental Status: She is alert. Mental status is at baseline.     IMPRESSION / MDM / ASSESSMENT AND PLAN / ED COURSE  I reviewed the triage vital signs and the nursing notes.  Differential diagnosis including viral illness including COVID/influenza, COPD exacerbation, pneumonia, anemia, viral illness.  Low suspicion for pulmonary embolism, no other risk factors, no pleuritic chest pain  EKG  I, Corena Herter, the attending physician, personally viewed and interpreted this ECG.  EKG with findings concerning for sinus tachycardia.  With reading as a heart rate of 194 however it is reading the T waves  and disagree with the heart rate.  No prolonged QTc.  No significant ST elevation or depression.  RADIOLOGY I independently reviewed imaging, my interpretation of imaging: No signs of pneumonia  LABS (all labs ordered are listed, but only abnormal results are displayed) Labs interpreted as -    Labs Reviewed  RESP PANEL BY RT-PCR (RSV, FLU A&B, COVID)  RVPGX2 - Abnormal; Notable for the following components:      Result Value   Influenza A by PCR POSITIVE (*)    All other components within normal limits  CBC - Abnormal; Notable for the following components:   WBC 11.5 (*)    RDW 15.6 (*)    Platelets 122 (*)    All other components within normal limits  COMPREHENSIVE METABOLIC PANEL - Abnormal; Notable for the following components:   Sodium 133 (*)    Chloride 96 (*)    Glucose, Bld 178 (*)    BUN <5 (*)    Albumin 3.3 (*)    AST 115 (*)    ALT 49 (*)    Alkaline Phosphatase 193 (*)    All other components within normal limits     MDM    Influenza positive.  Mild leukocytosis.  Creatinine  at baseline.  Mild elevation of LFTs.  Wheezing on exam with signs of respiratory distress.  Given IV Solu-Medrol, DuoNebs and Tamiflu.  On reevaluation continues to have increased work of breathing.  Hypoxic on room air to 88%.  Will place on 2 L nasal cannula.  Continues to have findings of respiratory distress.  Consulted hospitalist for admission for acute hypoxic respiratory failure in the setting of COPD exacerbation and influenza.   PROCEDURES:  Critical Care performed: yes  .Critical Care  Performed by: Corena Herter, MD Authorized by: Corena Herter, MD   Critical care provider statement:    Critical care time (minutes):  30   Critical care time was exclusive of:  Separately billable procedures and treating other patients   Critical care was necessary to treat or prevent imminent or life-threatening deterioration of the following conditions:  Respiratory failure   Critical care was time spent personally by me on the following activities:  Development of treatment plan with patient or surrogate, discussions with consultants, evaluation of patient's response to treatment, examination of patient, ordering and review of laboratory studies, ordering and review of radiographic studies, ordering and performing treatments and interventions, pulse oximetry, re-evaluation of patient's condition and review of old charts   Care discussed with: admitting provider     Patient's presentation is most consistent with acute presentation with potential threat to life or bodily function.   MEDICATIONS ORDERED IN ED: Medications  nicotine (NICODERM CQ - dosed in mg/24 hours) patch 21 mg (has no administration in time range)  acetaminophen (TYLENOL) tablet 1,000 mg (has no administration in time range)  nicotine polacrilex (NICORETTE) gum 2 mg (has no administration in time range)  oseltamivir (TAMIFLU) capsule 75 mg (75 mg Oral Given 08/02/23 1715)  methylPREDNISolone sodium succinate (SOLU-MEDROL)  125 mg/2 mL injection 125 mg (125 mg Intravenous Given 08/02/23 1718)  ipratropium-albuterol (DUONEB) 0.5-2.5 (3) MG/3ML nebulizer solution 9 mL (9 mLs Nebulization Given 08/02/23 1718)  lactated ringers bolus 500 mL (500 mLs Intravenous New Bag/Given 08/02/23 1716)    FINAL CLINICAL IMPRESSION(S) / ED DIAGNOSES   Final diagnoses:  Influenza  COPD exacerbation (HCC)     Rx / DC Orders   ED Discharge Orders     None  Note:  This document was prepared using Dragon voice recognition software and may include unintentional dictation errors.   Corena Herter, MD 08/02/23 1942

## 2023-08-02 NOTE — Assessment & Plan Note (Addendum)
COPD with acute bronchitis Acute respiratory failure with hypoxia Continue Tamiflu Scheduled and as needed nebulized bronchodilators and IV steroids Antitussives, flutter valve and incentive spirometer Supplemental oxygen to keep sats over 90

## 2023-08-02 NOTE — Progress Notes (Signed)
Virtual Visit Consent   Carly [name], you are scheduled for a virtual visit with a [facility] provider today. Just as with appointments in the office, your consent must be obtained to participate. Your consent will be active for this visit and any virtual visit you may have with one of our providers in the next 365 days. If you have a MyChart account, a copy of this consent can be sent to you electronically.  As this is a virtual visit, video technology does not allow for your provider to perform a traditional examination. This may limit your provider's ability to fully assess your condition. If your provider identifies any concerns that need to be evaluated in person or the need to arrange testing (such as labs, EKG, etc.), we will make arrangements to do so. Although advances in technology are sophisticated, we cannot ensure that it will always work on either your end or our end. If the connection with a video visit is poor, the visit may have to be switched to a telephone visit. With either a video or telephone visit, we are not always able to ensure that we have a secure connection.  By engaging in this virtual visit, you consent to the provision of healthcare and authorize for your insurance to be billed (if applicable) for the services provided during this visit. Depending on your insurance coverage, you may receive a charge related to this service.  I need to obtain your verbal consent now. Are you willing to proceed with your visit today? Carly [name] has provided verbal consent on 08/02/2023 for a virtual visit (video or telephone). Roxy Horseman, PA-C  Date: 08/02/2023 10:21 AM  Virtual Visit via Video Note   I, Roxy Horseman, [name]  ([id number], 11-Dec-1970) on 08/02/23 at 10:30 AM EST by a video-enabled telemedicine application and verified that I am speaking with the correct person using two identifiers.  Location: Patient: Virtual Visit Location Patient:  Home Provider: Virtual Visit Location Provider: Home Office   I discussed the limitations of evaluation and management by telemedicine and the availability of in person appointments. The patient expressed understanding and agreed to proceed.    History of Present Illness: Carly [name] is a 53 y.o. who identifies as a female who was assigned female at birth, and is being seen today for sinus congestion.  States that she was recently admitted for COPD exacerbation.  States that she has had some worsening sinus congestion and tightness in her chest that is typical of her COPD.  She states that she uses albuterol at home via nebulizer that helps, but she has run out of her albuterol solution and asks for a refill.  She denies fevers.  HPI: HPI  Problems:  Patient Active Problem List   Diagnosis Date Noted   COPD (chronic obstructive pulmonary disease) (HCC) 07/17/2023   Stress incontinence 04/03/2023   UTI symptoms 04/03/2023   Encounter for tobacco use cessation counseling 03/28/2023   Hypokalemia 09/09/2022   Hyponatremia 09/09/2022   COVID-19 09/09/2022   Tachycardia 09/09/2022   Pulmonary nodules 09/09/2022   Prolonged QT interval 09/09/2022   COPD exacerbation (HCC) 09/08/2022   Emphysema lung (HCC) 08/08/2022   Aortic atherosclerosis (HCC) 08/08/2022   Mood disorder (HCC) 08/08/2022   Colon cancer screening 08/08/2022   Breast cancer screening by mammogram 08/08/2022   Abnormal glucose 08/08/2022   Dyspnea on exertion 05/24/2022   Respiratory bronchiolitis associated interstitial lung disease (HCC) 05/24/2022  Anxiety and depression 03/21/2022   Allergic rhinitis 11/24/2021   Lumbar radiculopathy 07/11/2021   Gastroesophageal reflux disease without esophagitis 07/01/2020   Hyperlipidemia 07/01/2020   Fatty liver 12/24/2019   Angiolipoma of left kidney 12/24/2019   Chronic ITP (idiopathic thrombocytopenia) (HCC) 12/24/2019   Chronic ITP (idiopathic thrombocytopenic purpura)  (HCC) 12/02/2019   Plantar fasciitis, bilateral 09/29/2019   Osteoarthritis of both knees 09/29/2019   Overactive bladder 07/14/2019   B12 deficiency 06/10/2019   Vitamin D deficiency 06/10/2019   Insomnia 08/15/2016   Thrombocytopenia (HCC) 02/12/2016   Carpal tunnel syndrome 01/20/2016   Morbid obesity (HCC) 01/04/2016   Essential hypertension 01/04/2016   Asthma-COPD overlap syndrome (HCC) 01/13/2015   Female hirsutism 09/22/2014   Elevated testosterone level in female 09/22/2014   Tobacco use disorder 08/23/2014   Obesity (BMI 30-39.9) 08/23/2014   HTN (hypertension) 08/23/2014    Allergies:  Allergies  Allergen Reactions   Wellbutrin [Bupropion]     Crazy    Meloxicam Nausea Only   Medications:  Current Outpatient Medications:    acetaminophen (TYLENOL) 500 MG tablet, Take 500-1,000 mg by mouth every 6 (six) hours as needed for mild pain or headache., Disp: , Rfl:    albuterol (VENTOLIN HFA) 108 (90 Base) MCG/ACT inhaler, Inhale 2 puffs into the lungs every 6 (six) hours as needed for wheezing or shortness of breath., Disp: 8 g, Rfl: 0   amLODipine (NORVASC) 10 MG tablet, Take 10 mg by mouth daily., Disp: , Rfl:    ARIPiprazole (ABILIFY) 2 MG tablet, Take 2 tablets (4 mg total) by mouth daily. Dc rexulti not covered (Patient taking differently: Take 2 mg by mouth 2 (two) times daily.), Disp: , Rfl:    benzonatate (TESSALON) 100 MG capsule, Take 1-2 capsules (100-200 mg total) by mouth 3 (three) times daily as needed., Disp: 30 capsule, Rfl: 0   BREZTRI AEROSPHERE 160-9-4.8 MCG/ACT AERO, INHALE 2 PUFFS INTO THE LUNGS IN THE MORNING AND AT BEDTIME., Disp: 10.7 each, Rfl: 5   cyanocobalamin (VITAMIN B12) 1000 MCG/ML injection, INJECT 1 ML (1,000 MCG TOTAL) INTO THE MUSCLE EVERY 30 DAYS., Disp: 1 mL, Rfl: 15   D3-50 1.25 MG (50000 UT) capsule, Take 50,000 Units by mouth once a week., Disp: , Rfl:    escitalopram (LEXAPRO) 20 MG tablet, Take 1 tablet (20 mg total) by mouth daily.,  Disp: , Rfl:    lamoTRIgine (LAMICTAL) 25 MG tablet, Take 25 mg by mouth daily., Disp: , Rfl:    metoprolol succinate (TOPROL-XL) 100 MG 24 hr tablet, TAKE 1 TABLET BY MOUTH DAILY. TAKE WITH OR IMMEDIATELY FOLLOWING A MEAL., Disp: 30 tablet, Rfl: 2   nicotine (NICODERM CQ - DOSED IN MG/24 HOURS) 14 mg/24hr patch, Place 1 patch (14 mg total) onto the skin daily., Disp: 28 patch, Rfl: 0   omeprazole (PRILOSEC) 20 MG capsule, Take 1 capsule (20 mg total) by mouth daily., Disp: 90 capsule, Rfl: 1   oxybutynin (DITROPAN-XL) 5 MG 24 hr tablet, TAKE 1 TABLET BY MOUTH EVERYDAY AT BEDTIME, Disp: 30 tablet, Rfl: 0   traZODone (DESYREL) 50 MG tablet, TAKE 0.5-1 TABLETS BY MOUTH AT BEDTIME AS NEEDED FOR SLEEP., Disp: , Rfl:    Varenicline Tartrate, Starter, (CHANTIX STARTING MONTH PAK) 0.5 MG X 11 & 1 MG X 42 TBPK, Take as directed in the package that this is a starter pack., Disp: 42 each, Rfl: 0  Observations/Objective: Patient is well-developed, well-nourished in no acute distress.  Resting comfortably at home.  Head  is normocephalic, atraumatic.  No labored breathing.  Speech is clear and coherent with logical content.  Patient is alert and oriented at baseline.    Assessment and Plan: There are no diagnoses linked to this encounter.  I had a frank discussion with the patient that she will need to be seen in person if she doesn't start to improve soon.  She would like to try her home nebulizer and an antibiotic, but agrees to be seen in person if not improving.  I think she is high risk for needing readmission, but she doesn't exhibit signs of respiratory distress during the video visit and doesn't have increased WOB, so I think antibiotic trial and refill of home albuterol is reasonable.    Follow Up Instructions: I discussed the assessment and treatment plan with the patient. The patient was provided an opportunity to ask questions and all were answered. The patient agreed with the plan and  demonstrated an understanding of the instructions.  A copy of instructions were sent to the patient via MyChart unless otherwise noted below.     The patient was advised to call back or seek an in-person evaluation if the symptoms worsen or if the condition fails to improve as anticipated.    Roxy Horseman, PA-C

## 2023-08-02 NOTE — Assessment & Plan Note (Signed)
Platelets 122,000, improved from a couple weeks prior when it was 27,000

## 2023-08-02 NOTE — Assessment & Plan Note (Signed)
Mild elevation in AST ALT and alk phos Monitor

## 2023-08-02 NOTE — Assessment & Plan Note (Addendum)
Continue trazodone and escitalopram, Abilify

## 2023-08-02 NOTE — Assessment & Plan Note (Signed)
 NicoDerm patch

## 2023-08-02 NOTE — Assessment & Plan Note (Signed)
-   Continue metoprolol 

## 2023-08-02 NOTE — ED Triage Notes (Addendum)
Pt here with SOB x2 days. Pt denies cp. Pt endorses a strong cough, not coughing anything up. Pt has been taking her inhalers and nebs with no relief. Pt has a hx of COPD and states she is done smoking, last cigarette was yesterday.

## 2023-08-02 NOTE — Patient Instructions (Signed)
[name], thank you for joining Roxy Horseman, PA-C for today's virtual visit.  While this provider is not your primary care provider (PCP), if your PCP is located in our provider database this encounter information will be shared with them immediately following your visit.   A [facility] MyChart account gives you access to today's visit and all your visits, tests, and labs performed at [facility] " click here if you don't have a [facility] MyChart account or go to [email or web address]  Consent: (Patient) Carly [name] provided verbal consent for this virtual visit at the beginning of the encounter.  Current Medications:  Current Outpatient Medications:    acetaminophen (TYLENOL) 500 MG tablet, Take 500-1,000 mg by mouth every 6 (six) hours as needed for mild pain or headache., Disp: , Rfl:    albuterol (VENTOLIN HFA) 108 (90 Base) MCG/ACT inhaler, Inhale 2 puffs into the lungs every 6 (six) hours as needed for wheezing or shortness of breath., Disp: 8 g, Rfl: 0   amLODipine (NORVASC) 10 MG tablet, Take 10 mg by mouth daily., Disp: , Rfl:    ARIPiprazole (ABILIFY) 2 MG tablet, Take 2 tablets (4 mg total) by mouth daily. Dc rexulti not covered (Patient taking differently: Take 2 mg by mouth 2 (two) times daily.), Disp: , Rfl:    benzonatate (TESSALON) 100 MG capsule, Take 1-2 capsules (100-200 mg total) by mouth 3 (three) times daily as needed., Disp: 30 capsule, Rfl: 0   BREZTRI AEROSPHERE 160-9-4.8 MCG/ACT AERO, INHALE 2 PUFFS INTO THE LUNGS IN THE MORNING AND AT BEDTIME., Disp: 10.7 each, Rfl: 5   cyanocobalamin (VITAMIN B12) 1000 MCG/ML injection, INJECT 1 ML (1,000 MCG TOTAL) INTO THE MUSCLE EVERY 30 DAYS., Disp: 1 mL, Rfl: 15   D3-50 1.25 MG (50000 UT) capsule, Take 50,000 Units by mouth once a week., Disp: , Rfl:    escitalopram (LEXAPRO) 20 MG tablet, Take 1 tablet (20 mg total) by mouth daily., Disp: , Rfl:    lamoTRIgine (LAMICTAL) 25 MG tablet, Take 25 mg  by mouth daily., Disp: , Rfl:    metoprolol succinate (TOPROL-XL) 100 MG 24 hr tablet, TAKE 1 TABLET BY MOUTH DAILY. TAKE WITH OR IMMEDIATELY FOLLOWING A MEAL., Disp: 30 tablet, Rfl: 2   nicotine (NICODERM CQ - DOSED IN MG/24 HOURS) 14 mg/24hr patch, Place 1 patch (14 mg total) onto the skin daily., Disp: 28 patch, Rfl: 0   omeprazole (PRILOSEC) 20 MG capsule, Take 1 capsule (20 mg total) by mouth daily., Disp: 90 capsule, Rfl: 1   oxybutynin (DITROPAN-XL) 5 MG 24 hr tablet, TAKE 1 TABLET BY MOUTH EVERYDAY AT BEDTIME, Disp: 30 tablet, Rfl: 0   traZODone (DESYREL) 50 MG tablet, TAKE 0.5-1 TABLETS BY MOUTH AT BEDTIME AS NEEDED FOR SLEEP., Disp: , Rfl:    Varenicline Tartrate, Starter, (CHANTIX STARTING MONTH PAK) 0.5 MG X 11 & 1 MG X 42 TBPK, Take as directed in the package that this is a starter pack., Disp: 42 each, Rfl: 0   Medications ordered in this encounter:  No orders of the defined types were placed in this encounter.    *If you need refills on other medications prior to your next appointment, please contact your pharmacy*  Follow-Up: Call back or seek an in-person evaluation if the symptoms worsen or if the condition fails to improve as anticipated.  [facility] [phone]  Other Instructions   If you have been instructed to have an in-person evaluation today at  a local Urgent Care facility, please use the link below. It will take you to a list of all of our available [facility], including address, phone number and hours of operation. Please do not delay care.  [facility]  If you or a family member do not have a primary care provider, use the link below to schedule a visit and establish care. When you choose a [facility] primary care physician or advanced practice provider, you gain a long-term partner in health. Find a Primary Care Provider  Learn more about [facility] in-office and virtual care options: [facility] - Get Care  Now

## 2023-08-02 NOTE — ED Provider Triage Note (Signed)
Emergency Medicine Provider Triage Evaluation Note  Carly [name] , a 53 y.o. female  was evaluated in triage.  Pt complains of nonproductive cough and dyspnea on exertion x 2 days progressively worsening.   Review of Systems  Positive:  Negative:   Physical Exam  BP (!) 142/68 (BP Location: Right Arm)   Pulse (!) 112   Temp 99.3 F (37.4 C) (Oral)   Resp (!) 22   Ht 5\' 2"  (1.575 m)   Wt 101.4 kg   SpO2 94%   BMI 40.89 kg/m  Gen:   Awake, no distress   Resp:  Normal effort  MSK:   Moves extremities without difficulty  Other:    Medical Decision Making  Medically screening exam initiated at 1:43 PM.  Appropriate orders placed.  Carly [name] was informed that the remainder of the evaluation will be completed by another provider, this initial triage assessment does not replace that evaluation, and the importance of remaining in the ED until their evaluation is complete.     Carly [name], Carly [name] A, PA-C 08/02/23 1517

## 2023-08-03 ENCOUNTER — Other Ambulatory Visit: Payer: Self-pay

## 2023-08-03 ENCOUNTER — Encounter: Payer: Self-pay | Admitting: Internal Medicine

## 2023-08-03 DIAGNOSIS — J101 Influenza due to other identified influenza virus with other respiratory manifestations: Secondary | ICD-10-CM | POA: Diagnosis not present

## 2023-08-03 LAB — COMPREHENSIVE METABOLIC PANEL
ALT: 58 U/L — ABNORMAL HIGH (ref 0–44)
AST: 172 U/L — ABNORMAL HIGH (ref 15–41)
Albumin: 3.3 g/dL — ABNORMAL LOW (ref 3.5–5.0)
Alkaline Phosphatase: 171 U/L — ABNORMAL HIGH (ref 38–126)
Anion gap: 12 (ref 5–15)
BUN: 7 mg/dL (ref 6–20)
CO2: 23 mmol/L (ref 22–32)
Calcium: 8.5 mg/dL — ABNORMAL LOW (ref 8.9–10.3)
Chloride: 97 mmol/L — ABNORMAL LOW (ref 98–111)
Creatinine, Ser: 0.66 mg/dL (ref 0.44–1.00)
GFR, Estimated: >60 mL/min (ref >60)
Glucose, Bld: 169 mg/dL — ABNORMAL HIGH (ref 70–99)
Potassium: 4.1 mmol/L (ref 3.5–5.1)
Sodium: 132 mmol/L — ABNORMAL LOW (ref 135–145)
Total Bilirubin: 0.7 mg/dL (ref 0.0–1.2)
Total Protein: 7 g/dL (ref 6.5–8.1)

## 2023-08-03 LAB — CBC
HCT: 42.7 % (ref 36.0–46.0)
Hemoglobin: 14.1 g/dL (ref 12.0–15.0)
MCH: 31.5 pg (ref 26.0–34.0)
MCHC: 33 g/dL (ref 30.0–36.0)
MCV: 95.5 fL (ref 80.0–100.0)
Platelets: 104 10*3/uL — ABNORMAL LOW (ref 150–400)
RBC: 4.47 MIL/uL (ref 3.87–5.11)
RDW: 15.8 % — ABNORMAL HIGH (ref 11.5–15.5)
WBC: 9.5 10*3/uL (ref 4.0–10.5)
nRBC: 0 % (ref 0.0–0.2)

## 2023-08-03 MED ORDER — LAMOTRIGINE 25 MG PO TABS
25.0000 mg | ORAL_TABLET | Freq: Every day | ORAL | Status: DC
  Administered 2023-08-03 – 2023-08-06 (×4): 25 mg via ORAL
  Filled 2023-08-03 (×4): qty 1

## 2023-08-03 MED ORDER — ESCITALOPRAM OXALATE 10 MG PO TABS
20.0000 mg | ORAL_TABLET | Freq: Every day | ORAL | Status: DC
  Administered 2023-08-03 – 2023-08-06 (×4): 20 mg via ORAL
  Filled 2023-08-03 (×4): qty 2

## 2023-08-03 MED ORDER — GUAIFENESIN-CODEINE 100-10 MG/5ML PO SOLN
10.0000 mL | Freq: Once | ORAL | Status: AC
Start: 2023-08-03 — End: 2023-08-03
  Administered 2023-08-03: 10 mL via ORAL
  Filled 2023-08-03: qty 10

## 2023-08-03 MED ORDER — ARIPIPRAZOLE 2 MG PO TABS
2.0000 mg | ORAL_TABLET | Freq: Two times a day (BID) | ORAL | Status: DC
  Administered 2023-08-03 – 2023-08-06 (×7): 2 mg via ORAL
  Filled 2023-08-03 (×8): qty 1

## 2023-08-03 MED ORDER — METOPROLOL SUCCINATE ER 100 MG PO TB24
100.0000 mg | ORAL_TABLET | Freq: Every day | ORAL | Status: DC
  Administered 2023-08-03 – 2023-08-06 (×4): 100 mg via ORAL
  Filled 2023-08-03: qty 1
  Filled 2023-08-03: qty 2
  Filled 2023-08-03 (×2): qty 1

## 2023-08-03 MED ORDER — AMLODIPINE BESYLATE 10 MG PO TABS
10.0000 mg | ORAL_TABLET | Freq: Every day | ORAL | Status: DC
  Administered 2023-08-03 – 2023-08-06 (×4): 10 mg via ORAL
  Filled 2023-08-03 (×2): qty 1
  Filled 2023-08-03: qty 2
  Filled 2023-08-03: qty 1

## 2023-08-03 NOTE — Progress Notes (Signed)
Progress Note   Patient: Carly [name] OZH:[id number] DOB: [birth year] DOA: 08/02/2023     1 DOS: the patient was seen and examined on 08/03/2023   Brief hospital course: Carly [name] is a 53 y.o. female with medical history significant for Morbid obesity asthma/COPD, tobacco use disorder, chronic ITP, anxiety/depression, coming in with a 2-day history of cough, wheezing, SOB secondary to COPD exacerbation secondary to Influenza A infection   Assessment and Plan: COPD exacerbation secondary to Influenza A Acute respiratory failure with hypoxia: Currently on 2LPM O2 [place] Continue Tamiflu 75 mL twice daily, Scheduled every 6 hours DuoNebs and as needed nebulized bronchodilators and IV Solu-Medrol 40 Mg every 12 hours.  Will switch to p.o. prednisone tomorrow morning Continue antitussives Mucinex 6 mg twice daily,, flutter valve and incentive spirometry Supplemental oxygen to keep sats over 90  Transaminitis Mild elevation in AST (172) ALT (58) and alk phos (171) Monitor LFTs tomorrow  Essential hypertension: controlled Continue metoprolol and amlodipine  Anxiety and depression: stable Continue trazodone and escitalopram, Abilify  Chronic ITP (idiopathic thrombocytopenic purpura) (HCC) Platelets at 104K Improved from a couple weeks prior when it was 27,000  Obesity, Class III, BMI 40-49.9 (morbid obesity) (HCC) Complicating factor to overall prognosis and care  Tobacco use disorder NicoDerm patch      Subjective: Patient continues to have shortness of breath and cough. Reported chills, headache, generalized weakness, chest pain with coughing.  But no fever, denies dizziness, abdominal symptoms  Physical Exam: Vitals:   08/03/23 0400 08/03/23 0500 08/03/23 0543 08/03/23 0902  BP: 139/86 138/73  128/75  Pulse: (!) 114 (!) 107 (!) 108 (!) 101  Resp: (!) 32 (!) 22  (!) 25  Temp:  97.9 F (36.6 C)  98.1 F (36.7 C)  TempSrc:  Oral    SpO2: 95% 91% 93% 97%  Weight:       Height:      Physical Exam Constitutional:      Appearance: She is well-developed. She is ill-appearing and toxic-appearing.  HENT:     Head: Normocephalic and atraumatic.     Nose: Nose normal.     Mouth/Throat:     Mouth: Mucous membranes are moist.     Pharynx: Oropharynx is clear. No oropharyngeal exudate.  Eyes:     Extraocular Movements: Extraocular movements intact.     Conjunctiva/sclera: Conjunctivae normal.  Cardiovascular:     Rate and Rhythm: Regular rhythm. Tachycardia present.     Heart sounds: Normal heart sounds. No murmur heard. Pulmonary:     Breath sounds: Wheezing and rhonchi present.     Comments: Mild tachypnea, on 2 L/min O2 nasal cannula Abdominal:     General: Abdomen is flat. Bowel sounds are normal.     Palpations: Abdomen is soft.  Musculoskeletal:        General: No swelling or tenderness. Normal range of motion.     Cervical back: Normal range of motion and neck supple.     Right lower leg: No edema.     Left lower leg: No edema.  Skin:    General: Skin is warm.     Capillary Refill: Capillary refill takes 2 to 3 seconds.     Findings: No erythema.  Neurological:     General: No focal deficit present.     Mental Status: She is alert and oriented to person, place, and time.     Cranial Nerves: No cranial nerve deficit.     Sensory: No sensory deficit.  Motor: No weakness.  Psychiatric:        Mood and Affect: Mood normal.        Thought Content: Thought content normal.      Data Reviewed:  Latest Reference Range & Units 08/03/23 05:59  Sodium 135 - 145 mmol/L 132 (L)  Potassium 3.5 - 5.1 mmol/L 4.1  Chloride 98 - 111 mmol/L 97 (L)  CO2 22 - 32 mmol/L 23  Glucose 70 - 99 mg/dL 295 (H)  BUN 6 - 20 mg/dL 7  Creatinine 2.84 - 1.32 mg/dL 4.40  Calcium 8.9 - 10.2 mg/dL 8.5 (L)  Anion gap 5 - 15  12  Alkaline Phosphatase 38 - 126 U/L 171 (H)  Albumin 3.5 - 5.0 g/dL 3.3 (L)  AST 15 - 41 U/L 172 (H)  ALT 0 - 44 U/L 58 (H)  Total  Protein 6.5 - 8.1 g/dL 7.0  Total Bilirubin 0.0 - 1.2 mg/dL 0.7  GFR, Estimated >72 mL/min >60  WBC 4.0 - 10.5 K/uL 9.5  RBC 3.87 - 5.11 MIL/uL 4.47  Hemoglobin 12.0 - 15.0 g/dL 53.6  HCT 64.4 - 03.4 % 42.7  MCV 80.0 - 100.0 fL 95.5  MCH 26.0 - 34.0 pg 31.5  MCHC 30.0 - 36.0 g/dL 74.2  RDW 59.5 - 63.8 % 15.8 (H)  Platelets 150 - 400 K/uL 104 (L)  nRBC 0.0 - 0.2 % 0.0  (L): Data is abnormally low (H): Data is abnormally high  Family Communication: Updated patient and family at bedside about plans of care  Disposition: Status is: Inpatient   Planned Discharge Destination: Home    Time spent: 35 minutes  Author: Ernestene Mention, MD 08/03/2023 9:25 AM  For on call review [email or web address].

## 2023-08-03 NOTE — Progress Notes (Signed)
Patient alarm beeping. Patient found standing by bed after incontinent episode in bed and on floor attempting to change clothes. Patient very short of breath. Patient reports it happens sometimes with lots of coughing. Assisted patient to change clothes and get cleaned up. Bed changed.

## 2023-08-04 DIAGNOSIS — J101 Influenza due to other identified influenza virus with other respiratory manifestations: Secondary | ICD-10-CM | POA: Diagnosis not present

## 2023-08-04 LAB — COMPREHENSIVE METABOLIC PANEL
ALT: 69 U/L — ABNORMAL HIGH (ref 0–44)
AST: 203 U/L — ABNORMAL HIGH (ref 15–41)
Albumin: 3.2 g/dL — ABNORMAL LOW (ref 3.5–5.0)
Alkaline Phosphatase: 171 U/L — ABNORMAL HIGH (ref 38–126)
Anion gap: 11 (ref 5–15)
BUN: 11 mg/dL (ref 6–20)
CO2: 26 mmol/L (ref 22–32)
Calcium: 7.9 mg/dL — ABNORMAL LOW (ref 8.9–10.3)
Chloride: 97 mmol/L — ABNORMAL LOW (ref 98–111)
Creatinine, Ser: 0.54 mg/dL (ref 0.44–1.00)
GFR, Estimated: >60 mL/min (ref >60)
Glucose, Bld: 168 mg/dL — ABNORMAL HIGH (ref 70–99)
Potassium: 4.5 mmol/L (ref 3.5–5.1)
Sodium: 134 mmol/L — ABNORMAL LOW (ref 135–145)
Total Bilirubin: 0.5 mg/dL (ref 0.0–1.2)
Total Protein: 6.7 g/dL (ref 6.5–8.1)

## 2023-08-04 LAB — MAGNESIUM: Magnesium: 1.9 mg/dL (ref 1.7–2.4)

## 2023-08-04 MED ORDER — NICOTINE 7 MG/24HR TD PT24: 7.0000 mg | MEDICATED_PATCH | Freq: Every day | TRANSDERMAL | Status: DC

## 2023-08-04 MED ORDER — NICOTINE 14 MG/24HR TD PT24
14.0000 mg | MEDICATED_PATCH | Freq: Every day | TRANSDERMAL | Status: DC
Start: 2023-08-04 — End: 2023-08-06
  Administered 2023-08-04 – 2023-08-06 (×3): 14 mg via TRANSDERMAL
  Filled 2023-08-04 (×3): qty 1

## 2023-08-04 NOTE — Plan of Care (Signed)
  Problem: Education: Goal: Knowledge of General Education information will improve Description: Including pain rating scale, medication(s)/side effects and non-pharmacologic comfort measures Outcome: Progressing   Problem: Health Behavior/Discharge Planning: Goal: Ability to manage health-related needs will improve Outcome: Progressing   Problem: Clinical Measurements: Goal: Ability to maintain clinical measurements within normal limits will improve Outcome: Progressing Goal: Will remain free from infection Outcome: Progressing Goal: Diagnostic test results will improve Outcome: Progressing Goal: Respiratory complications will improve Outcome: Progressing Goal: Cardiovascular complication will be avoided Outcome: Progressing   Problem: Activity: Goal: Risk for activity intolerance will decrease Outcome: Progressing   Problem: Nutrition: Goal: Adequate nutrition will be maintained Outcome: Progressing   Problem: Coping: Goal: Level of anxiety will decrease Outcome: Progressing   Problem: Elimination: Goal: Will not experience complications related to bowel motility Outcome: Progressing Goal: Will not experience complications related to urinary retention Outcome: Progressing   Problem: Safety: Goal: Ability to remain free from injury will improve Outcome: Progressing   Problem: Skin Integrity: Goal: Risk for impaired skin integrity will decrease Outcome: Progressing   Problem: Education: Goal: Knowledge of disease or condition will improve Outcome: Progressing Goal: Knowledge of the prescribed therapeutic regimen will improve Outcome: Progressing Goal: Individualized Educational Video(s) Outcome: Progressing   Problem: Activity: Goal: Ability to tolerate increased activity will improve Outcome: Progressing Goal: Will verbalize the importance of balancing activity with adequate rest periods Outcome: Progressing

## 2023-08-04 NOTE — TOC CM/SW Note (Signed)
Transition of Care [facility]) - Inpatient Brief Assessment   Patient Details  Name: Carly [name] MRN: [id number] Date of Birth: 10-02-1970  [facility] CM/SW Contact:    Liliana Cline, LCSW Phone Number: 08/04/2023, 10:47 AM   Clinical Narrative:    Transition of Care Asessment: Insurance and Status: Insurance coverage has been reviewed Patient has primary care physician: Yes     Prior/Current Home Services: No current home services Social Drivers of Health Review: SDOH reviewed no interventions necessary Readmission risk has been reviewed: Yes Transition of care needs: no transition of care needs at this time

## 2023-08-04 NOTE — Progress Notes (Signed)
Progress Note   Patient: Carly [name] XLK:[id number] DOB: 1970/08/11 DOA: 08/02/2023     2 DOS: the patient was seen and examined on 08/04/2023   Brief hospital course: RHIA MEAKIN is a 53 y.o. female with medical history significant for Morbid obesity asthma/COPD, tobacco use disorder, chronic ITP, anxiety/depression, coming in with a 2-day history of cough, wheezing, SOB secondary to COPD exacerbation secondary to Influenza A infection   Assessment and Plan: COPD exacerbation secondary to Influenza A Acute respiratory failure with hypoxia: Currently on 2LPM O2 [place] Continue Tamiflu 75 mL twice daily, Continue scheduled every 6 hours DuoNebs and as needed nebulized bronchodilators and IV Solu-Medrol 40 Mg every 12 hours.  Will switch to p.o. prednisone tomorrow morning Continue antitussives Mucinex 6 mg twice daily,, flutter valve and incentive spirometry Supplemental oxygen to keep sats over 90  Transaminitis Mild elevation in AST (172--> 203) ALT (58--> 69) and alk phos (171--> 171) T bili wnl Monitor LFTs tomorrow  Essential hypertension: controlled Continue metoprolol and amlodipine  Anxiety and depression: stable Continue trazodone and escitalopram, Abilify  Chronic ITP (idiopathic thrombocytopenic purpura) (HCC) Platelets at 104K Improved from a couple weeks prior when it was 27,000  Obesity, Class III, BMI 40-49.9 (morbid obesity) (HCC) Complicating factor to overall prognosis and care  Tobacco use disorder NicoDerm patch      Subjective: Patient continues to have shortness of breath and cough but improving from yesterday. Reported sweating, chills, headache, generalized weakness, chest pain with coughing.   Denied fever, dizziness, abdominal symptoms  Physical Exam: Vitals:   08/03/23 1852 08/03/23 2028 08/04/23 0403 08/04/23 0751  BP: 112/71 115/79 105/77 116/73  Pulse: 94 92 86 80  Resp: 19  20 19   Temp: 98.3 F (36.8 C) 98.2 F (36.8 C) 98.2 F (36.8  C) 98.2 F (36.8 C)  TempSrc: Oral Oral Oral Oral  SpO2: 96% 96% 96% 98%  Weight:      Height:      Physical Exam Constitutional:      Appearance: She is well-developed. She is ill-appearing.  HENT:     Head: Normocephalic and atraumatic.     Nose: Nose normal.     Mouth/Throat:     Mouth: Mucous membranes are moist.     Pharynx: Oropharynx is clear. No oropharyngeal exudate.  Eyes:     Extraocular Movements: Extraocular movements intact.     Conjunctiva/sclera: Conjunctivae normal.  Cardiovascular:     Rate and Rhythm: Normal rate and regular rhythm.     Heart sounds: Normal heart sounds. No murmur heard. Pulmonary:     Breath sounds: Wheezing and rhonchi present.     Comments: Mild tachypnea, on 2 L/min O2 nasal cannula Abdominal:     General: Abdomen is flat. Bowel sounds are normal.     Palpations: Abdomen is soft.  Musculoskeletal:        General: No swelling or tenderness. Normal range of motion.     Cervical back: Normal range of motion and neck supple.     Right lower leg: No edema.     Left lower leg: No edema.  Skin:    General: Skin is warm.     Capillary Refill: Capillary refill takes 2 to 3 seconds.     Findings: No erythema.  Neurological:     General: No focal deficit present.     Mental Status: She is alert and oriented to person, place, and time.     Cranial Nerves: No cranial nerve  deficit.     Sensory: No sensory deficit.     Motor: No weakness.  Psychiatric:        Mood and Affect: Mood normal.        Thought Content: Thought content normal.      Data Reviewed:   Latest Reference Range & Units 08/04/23 05:00  Sodium 135 - 145 mmol/L 134 (L)  Potassium 3.5 - 5.1 mmol/L 4.5  Chloride 98 - 111 mmol/L 97 (L)  CO2 22 - 32 mmol/L 26  Glucose 70 - 99 mg/dL 284 (H)  BUN 6 - 20 mg/dL 11  Creatinine 1.32 - 4.40 mg/dL 1.02  Calcium 8.9 - 72.5 mg/dL 7.9 (L)  Anion gap 5 - 15  11  Magnesium 1.7 - 2.4 mg/dL 1.9  Alkaline Phosphatase 38 - 126 U/L 171  (H)  Albumin 3.5 - 5.0 g/dL 3.2 (L)  AST 15 - 41 U/L 203 (H)  ALT 0 - 44 U/L 69 (H)  Total Protein 6.5 - 8.1 g/dL 6.7  Total Bilirubin 0.0 - 1.2 mg/dL 0.5  GFR, Estimated >36 mL/min >60  (L): Data is abnormally low (H): Data is abnormally high  Family Communication: Updated patient and family at bedside about plans of care  Disposition: Status is: Inpatient   Planned Discharge Destination: Home    Time spent: 35 minutes  Author: Ernestene Mention, MD 08/04/2023 10:04 AM  For on call review [email or web address].

## 2023-08-05 DIAGNOSIS — J101 Influenza due to other identified influenza virus with other respiratory manifestations: Secondary | ICD-10-CM | POA: Diagnosis not present

## 2023-08-05 LAB — HEPATIC FUNCTION PANEL
ALT: 78 U/L — ABNORMAL HIGH (ref 0–44)
AST: 199 U/L — ABNORMAL HIGH (ref 15–41)
Albumin: 2.9 g/dL — ABNORMAL LOW (ref 3.5–5.0)
Alkaline Phosphatase: 151 U/L — ABNORMAL HIGH (ref 38–126)
Bilirubin, Direct: 0.1 mg/dL (ref 0.0–0.2)
Indirect Bilirubin: 0.2 mg/dL — ABNORMAL LOW (ref 0.3–0.9)
Total Bilirubin: 0.3 mg/dL (ref 0.0–1.2)
Total Protein: 6.5 g/dL (ref 6.5–8.1)

## 2023-08-05 NOTE — Progress Notes (Signed)
Progress Note   Patient: Carly [name]:[id number] DOB: 1970/11/23 DOA: 08/02/2023     3 DOS: the patient was seen and examined on 08/05/2023   Brief hospital course: Carly [name] is a 53 y.o. female with medical history significant for Morbid obesity asthma/COPD, tobacco use disorder, chronic ITP, anxiety/depression, coming in with a 2-day history of cough, wheezing, SOB secondary to COPD exacerbation secondary to Influenza A infection   Assessment and Plan: COPD exacerbation secondary to Influenza A Acute respiratory failure with hypoxia: Currently on 1.5 LPM O2  Continue Tamiflu 75 mL twice daily, Continue scheduled every 6 hours DuoNebs and as needed nebulized bronchodilators and IV Solu-Medrol 40 Mg every 12 hours.  Will switch to p.o. prednisone tomorrow morning Continue antitussives Mucinex 6 mg twice daily,, flutter valve and incentive spirometry Supplemental oxygen to keep sats over 90 Incentive spirometry Q2h while awake  Transaminitis Mild elevation in AST (172--> 203--> 199) ALT (58--> 69--> 78) and alk phos (171--> 171-> 151) T bili wnl LFTs stable  Essential hypertension: controlled Continue metoprolol and amlodipine  Anxiety and depression: stable Continue trazodone and escitalopram, Abilify  Chronic ITP (idiopathic thrombocytopenic purpura) (HCC) Platelets at 104K Improved from a couple weeks prior when it was 27,000  Obesity, Class III, BMI 40-49.9 (morbid obesity) (HCC) Complicating factor to overall prognosis and care  Tobacco use disorder NicoDerm patch      Subjective: Patient continues to have shortness of breath and cough. Reported chills, headache, generalized weakness, chest pain with coughing.    Denied fever, dizziness, abdominal symptoms  Physical Exam: Vitals:   08/05/23 0136 08/05/23 0745 08/05/23 1103 08/05/23 1337  BP:   107/62   Pulse:   74   Resp:   20   Temp:   98 F (36.7 C)   TempSrc:   Oral   SpO2: 97% 94% 96% 94%   Weight:      Height:      Physical Exam Constitutional:      Appearance: She is well-developed. She is ill-appearing.  HENT:     Head: Normocephalic and atraumatic.     Nose: Nose normal.     Mouth/Throat:     Mouth: Mucous membranes are moist.     Pharynx: Oropharynx is clear. No oropharyngeal exudate.  Eyes:     Extraocular Movements: Extraocular movements intact.     Conjunctiva/sclera: Conjunctivae normal.  Cardiovascular:     Rate and Rhythm: Normal rate and regular rhythm.     Heart sounds: Normal heart sounds. No murmur heard. Pulmonary:     Breath sounds: Wheezing and rhonchi present.     Comments: Mild tachypnea, on 1.5L/min O2 nasal cannula Abdominal:     General: Abdomen is flat. Bowel sounds are normal.     Palpations: Abdomen is soft.  Musculoskeletal:        General: No swelling or tenderness. Normal range of motion.     Cervical back: Normal range of motion and neck supple.     Right lower leg: No edema.     Left lower leg: No edema.  Skin:    General: Skin is warm.     Capillary Refill: Capillary refill takes 2 to 3 seconds.     Findings: No erythema.  Neurological:     General: No focal deficit present.     Mental Status: She is alert and oriented to person, place, and time.     Cranial Nerves: No cranial nerve deficit.     Sensory:  No sensory deficit.     Motor: No weakness.  Psychiatric:        Mood and Affect: Mood normal.        Thought Content: Thought content normal.      Data Reviewed:   Latest Reference Range & Units 08/05/23 04:17  Alkaline Phosphatase 38 - 126 U/L 151 (H)  Albumin 3.5 - 5.0 g/dL 2.9 (L)  AST 15 - 41 U/L 199 (H)  ALT 0 - 44 U/L 78 (H)  Total Protein 6.5 - 8.1 g/dL 6.5  Bilirubin, Direct 0.0 - 0.2 mg/dL 0.1  Indirect Bilirubin 0.3 - 0.9 mg/dL 0.2 (L)  Total Bilirubin 0.0 - 1.2 mg/dL 0.3  (H): Data is abnormally high (L): Data is abnormally low  Latest Reference Range & Units 08/04/23 05:00  Sodium 135 - 145 mmol/L  134 (L)  Potassium 3.5 - 5.1 mmol/L 4.5  Chloride 98 - 111 mmol/L 97 (L)  CO2 22 - 32 mmol/L 26  Glucose 70 - 99 mg/dL 161 (H)  BUN 6 - 20 mg/dL 11  Creatinine 0.96 - 0.45 mg/dL 4.09  Calcium 8.9 - 81.1 mg/dL 7.9 (L)  Anion gap 5 - 15  11  Magnesium 1.7 - 2.4 mg/dL 1.9  Alkaline Phosphatase 38 - 126 U/L 171 (H)  Albumin 3.5 - 5.0 g/dL 3.2 (L)  AST 15 - 41 U/L 203 (H)  ALT 0 - 44 U/L 69 (H)  Total Protein 6.5 - 8.1 g/dL 6.7  Total Bilirubin 0.0 - 1.2 mg/dL 0.5  GFR, Estimated >91 mL/min >60  (L): Data is abnormally low (H): Data is abnormally high  Family Communication: Updated patient and family at bedside about plans of care  Disposition: Status is: Inpatient   Planned Discharge Destination: Home    Time spent: 35 minutes  Author: Ernestene Mention, MD 08/05/2023 3:32 PM  For on call review [email or web address].

## 2023-08-05 NOTE — Plan of Care (Signed)
Problem: Education: Goal: Knowledge of General Education information will improve Description: Including pain rating scale, medication(s)/side effects and non-pharmacologic comfort measures 08/05/2023 0823 by Risa Grill, RN Outcome: Progressing 08/05/2023 0823 by Risa Grill, RN Outcome: Progressing   Problem: Health Behavior/Discharge Planning: Goal: Ability to manage health-related needs will improve 08/05/2023 0823 by Risa Grill, RN Outcome: Progressing 08/05/2023 0823 by Risa Grill, RN Outcome: Progressing   Problem: Clinical Measurements: Goal: Ability to maintain clinical measurements within normal limits will improve 08/05/2023 0823 by Risa Grill, RN Outcome: Progressing 08/05/2023 0823 by Risa Grill, RN Outcome: Progressing Goal: Will remain free from infection 08/05/2023 0823 by Risa Grill, RN Outcome: Progressing 08/05/2023 0823 by Risa Grill, RN Outcome: Progressing Goal: Diagnostic test results will improve 08/05/2023 0823 by Risa Grill, RN Outcome: Progressing 08/05/2023 0823 by Risa Grill, RN Outcome: Progressing Goal: Respiratory complications will improve 08/05/2023 0823 by Risa Grill, RN Outcome: Progressing 08/05/2023 0823 by Risa Grill, RN Outcome: Progressing Goal: Cardiovascular complication will be avoided 08/05/2023 0823 by Risa Grill, RN Outcome: Progressing 08/05/2023 0823 by Risa Grill, RN Outcome: Progressing   Problem: Activity: Goal: Risk for activity intolerance will decrease 08/05/2023 0823 by Risa Grill, RN Outcome: Progressing 08/05/2023 0823 by Risa Grill, RN Outcome: Progressing   Problem: Nutrition: Goal: Adequate nutrition will be maintained 08/05/2023 0823 by Risa Grill, RN Outcome:  Progressing 08/05/2023 0823 by Risa Grill, RN Outcome: Progressing   Problem: Coping: Goal: Level of anxiety will decrease 08/05/2023 0823 by Risa Grill, RN Outcome: Progressing 08/05/2023 0823 by Risa Grill, RN Outcome: Progressing   Problem: Elimination: Goal: Will not experience complications related to bowel motility 08/05/2023 0823 by Risa Grill, RN Outcome: Progressing 08/05/2023 0823 by Risa Grill, RN Outcome: Progressing Goal: Will not experience complications related to urinary retention 08/05/2023 0823 by Risa Grill, RN Outcome: Progressing 08/05/2023 0823 by Risa Grill, RN Outcome: Progressing   Problem: Pain Managment: Goal: General experience of comfort will improve and/or be controlled 08/05/2023 0823 by Risa Grill, RN Outcome: Progressing 08/05/2023 0823 by Risa Grill, RN Outcome: Progressing   Problem: Safety: Goal: Ability to remain free from injury will improve 08/05/2023 0823 by Risa Grill, RN Outcome: Progressing 08/05/2023 0823 by Risa Grill, RN Outcome: Progressing   Problem: Education: Goal: Knowledge of disease or condition will improve 08/05/2023 0823 by Risa Grill, RN Outcome: Progressing 08/05/2023 0823 by Risa Grill, RN Outcome: Progressing Goal: Knowledge of the prescribed therapeutic regimen will improve 08/05/2023 0823 by Risa Grill, RN Outcome: Progressing 08/05/2023 0823 by Risa Grill, RN Outcome: Progressing Goal: Individualized Educational Video(s) 08/05/2023 0823 by Risa Grill, RN Outcome: Progressing 08/05/2023 0823 by Risa Grill, RN Outcome: Progressing   Problem: Activity: Goal: Ability to tolerate increased activity will improve 08/05/2023 0823 by Risa Grill, RN Outcome:  Progressing 08/05/2023 0823 by Risa Grill, RN Outcome: Progressing Goal: Will verbalize the importance of balancing activity with adequate rest periods 08/05/2023 0823 by Risa Grill, RN Outcome: Progressing 08/05/2023 0823 by Risa Grill, RN Outcome: Progressing   Problem: Respiratory: Goal: Ability to maintain a clear airway will improve 08/05/2023 0823 by Risa Grill, RN Outcome: Progressing 08/05/2023 0823 by Risa Grill, RN Outcome: Progressing Goal: Levels of oxygenation will improve 08/05/2023 0823 by Risa Grill, RN Outcome: Progressing 08/05/2023 0823 by Risa Grill,  RN Outcome: Progressing Goal: Ability to maintain adequate ventilation will improve 08/05/2023 0823 by Risa Grill, RN Outcome: Progressing 08/05/2023 0823 by Risa Grill, RN Outcome: Progressing

## 2023-08-06 DIAGNOSIS — J101 Influenza due to other identified influenza virus with other respiratory manifestations: Secondary | ICD-10-CM | POA: Diagnosis not present

## 2023-08-06 DIAGNOSIS — Z720 Tobacco use: Secondary | ICD-10-CM | POA: Insufficient documentation

## 2023-08-06 MED ORDER — OSELTAMIVIR PHOSPHATE 75 MG PO CAPS: 75.0000 mg | ORAL_CAPSULE | Freq: Two times a day (BID) | ORAL | 0 refills | Status: AC

## 2023-08-06 MED ORDER — PREDNISONE 20 MG PO TABS: 40.0000 mg | ORAL_TABLET | Freq: Every day | ORAL | 0 refills | Status: AC

## 2023-08-06 NOTE — Progress Notes (Signed)
  August 06, 2023  Patient: Carly [name]  Date of Birth: 1971-01-19  Date of Visit: 08/02/2023    To Whom It May Concern:  Laurelyn Machado was seen and treated at [facility] 08/02/2023-08/06/23. Per Dr Mariane Masters [name] can return to work on Thursday, August 08, 2023.  Sincerely,    [place]y [place] RN

## 2023-08-06 NOTE — TOC Transition Note (Signed)
Transition of Care [facility]) - Discharge Note   Patient Details  Name: Carly [name] MRN: [id number] Date of Birth: 1970-12-23  [facility] CM/SW Contact:  Chapman Fitch, RN Phone Number: 08/06/2023, 12:16 PM   Clinical Narrative:     Patient to DC today Noted consult for PCP Dr Clent Ridges listed as patient's PCP, List of local PCP added to AVS         Patient Goals and CMS Choice            Discharge Placement                       Discharge Plan and Services Additional resources added to the After Visit Summary for                                       Social Drivers of Health (SDOH) Interventions SDOH Screenings   Food Insecurity: No Food Insecurity (08/03/2023)  Housing: Low Risk  (08/03/2023)  Transportation Needs: No Transportation Needs (08/03/2023)  Utilities: Not At Risk (08/03/2023)  Depression (PHQ2-9): Low Risk  (04/08/2023)  Tobacco Use: High Risk (08/03/2023)     Readmission Risk Interventions     No data to display

## 2023-08-06 NOTE — Discharge Summary (Signed)
Physician Discharge Summary   Patient: Carly [name] MRN: [id number] DOB: [birth year]  Admit date:     08/02/2023  Discharge date: 08/06/23  Discharge Physician: Ernestene Mention   PCP: Dana Allan, MD   Recommendations at discharge:  Follow up PCP in 1 week of this discharge  Discharge Diagnoses: Principal Problem:   Influenza A Active Problems:   COPD with acute bronchitis (HCC)   Transaminitis   Essential hypertension   Smoking   Obesity, Class III, BMI 40-49.9 (morbid obesity) (HCC)   Thrombocytopenia (HCC)   Chronic ITP (idiopathic thrombocytopenic purpura) (HCC)   Anxiety and depression   Tobacco use  Resolved Problems:   Acute respiratory failure with hypoxia (HCC)  Brief hospital course: Carly [name] is a 52 y.o. female with medical history significant for Morbid obesity asthma/COPD, tobacco use disorder, chronic ITP, anxiety/depression, coming in with a 2-day history of cough, wheezing, SOB secondary to COPD exacerbation secondary to Influenza A infection    Assessment and Plan: COPD exacerbation secondary to Influenza A Acute respiratory failure with hypoxia: Iniitally requiring 4LPM O2 [place]--> Gradualled weaned off of O2 [place]. Although pt was started on IV Rocephin and IV Azithromycin, stopped the antibiotics and treated with Tamiflu 75 mL twice daily, scheduled every 6 hours DuoNebs and as needed nebulized bronchodilators and IV Solu-Medrol 40 Mg every 12 hours which was switched to p.o. prednisone  Gave antitussives Mucinex 6 mg twice daily, flutter valve and incentive spirometry Patient is stable to get discharged to home today.   Transaminitis Mild elevation in AST (172--> 203--> 199) ALT (58--> 69--> 78) and alk phos (171--> 171-> 151) T bili wnl LFTs stable, f/u as out patient   Essential hypertension: controlled Continue metoprolol and amlodipine   Anxiety and depression: stable Continue trazodone and escitalopram, Abilify   Chronic ITP (idiopathic  thrombocytopenic purpura) (HCC) Platelets at 104K Improved from a couple weeks prior when it was 27,000   Obesity, Class III, BMI 40-49.9 (morbid obesity) (HCC) Complicating factor to overall prognosis and care   Tobacco use disorder NicoDerm patch         Disposition: Home Diet recommendation:  Discharge Diet Orders (From admission, onward)     Start     Ordered   08/06/23 0000  Diet - low sodium heart healthy        08/06/23 1152        DISCHARGE MEDICATION: Allergies as of 08/06/2023       Reactions   Wellbutrin [bupropion]    Crazy   Meloxicam Nausea Only        Medication List     STOP taking these medications    amoxicillin-clavulanate 875-125 MG tablet Commonly known as: AUGMENTIN       TAKE these medications    acetaminophen 500 MG tablet Commonly known as: TYLENOL Take 500-1,000 mg by mouth every 6 (six) hours as needed for mild pain or headache.   albuterol 108 (90 Base) MCG/ACT inhaler Commonly known as: VENTOLIN HFA Inhale 2 puffs into the lungs every 6 (six) hours as needed for wheezing or shortness of breath.   albuterol (2.5 MG/3ML) 0.083% nebulizer solution Commonly known as: PROVENTIL Take 3 mLs (2.5 mg total) by nebulization every 6 (six) hours as needed for wheezing or shortness of breath.   amLODipine 10 MG tablet Commonly known as: NORVASC Take 10 mg by mouth daily.   ARIPiprazole 2 MG tablet Commonly known as: Abilify Take 2 tablets (4 mg total) by mouth  daily. Dc rexulti not covered What changed:  how much to take when to take this additional instructions   benzonatate 100 MG capsule Commonly known as: TESSALON Take 1-2 capsules (100-200 mg total) by mouth 3 (three) times daily as needed.   Breztri Aerosphere 160-9-4.8 MCG/ACT Aero Generic drug: Budeson-Glycopyrrol-Formoterol INHALE 2 PUFFS INTO THE LUNGS IN THE MORNING AND AT BEDTIME.   cyanocobalamin 1000 MCG/ML injection Commonly known as: VITAMIN B12 INJECT  1 ML (1,000 MCG TOTAL) INTO THE MUSCLE EVERY 30 DAYS.   D3-50 1.25 MG (50000 UT) capsule Generic drug: Cholecalciferol Take 50,000 Units by mouth once a week.   escitalopram 20 MG tablet Commonly known as: LEXAPRO Take 1 tablet (20 mg total) by mouth daily.   lamoTRIgine 25 MG tablet Commonly known as: LAMICTAL Take 50 mg by mouth daily.   metoprolol succinate 100 MG 24 hr tablet Commonly known as: TOPROL-XL TAKE 1 TABLET BY MOUTH DAILY. TAKE WITH OR IMMEDIATELY FOLLOWING A MEAL.   nicotine 14 mg/24hr patch Commonly known as: NICODERM CQ - dosed in mg/24 hours Place 1 patch (14 mg total) onto the skin daily.   omeprazole 20 MG capsule Commonly known as: PRILOSEC Take 1 capsule (20 mg total) by mouth daily.   oseltamivir 75 MG capsule Commonly known as: TAMIFLU Take 1 capsule (75 mg total) by mouth 2 (two) times daily for 2 days.   oxybutynin 5 MG 24 hr tablet Commonly known as: DITROPAN-XL TAKE 1 TABLET BY MOUTH EVERYDAY AT BEDTIME   predniSONE 20 MG tablet Commonly known as: DELTASONE Take 2 tablets (40 mg total) by mouth daily with breakfast for 2 days. Start taking on: August 07, 2023   traZODone 50 MG tablet Commonly known as: DESYREL TAKE 0.5-1 TABLETS BY MOUTH AT BEDTIME AS NEEDED FOR SLEEP.   Varenicline Tartrate (Starter) 0.5 MG X 11 & 1 MG X 42 Tbpk Commonly known as: Chantix Starting [employer] Take as directed in the package that this is a starter pack.        Discharge Exam: Filed Weights   08/02/23 1333  Weight: 101.4 kg   Constitutional:      Appearance: She is well-developed.  HENT:     Head: Normocephalic and atraumatic.     Nose: Nose normal.     Mouth/Throat:     Mouth: Mucous membranes are moist.     Pharynx: Oropharynx is clear. No oropharyngeal exudate.  Eyes:     Extraocular Movements: Extraocular movements intact.     Conjunctiva/sclera: Conjunctivae normal.  Cardiovascular:     Rate and Rhythm: Normal rate and regular rhythm.      Heart sounds: Normal heart sounds. No murmur heard. Pulmonary:     Breath sounds: Wheezing and rhonchi improved    Comments: Abdominal:     General: Abdomen is flat. Bowel sounds are normal.     Palpations: Abdomen is soft.  Musculoskeletal:        General: No swelling or tenderness. Normal range of motion.     Cervical back: Normal range of motion and neck supple.     Right lower leg: No edema.     Left lower leg: No edema.  Skin:    General: Skin is warm.     Capillary Refill: Capillary refill takes 2 to 3 seconds.     Findings: No erythema.  Neurological:     General: No focal deficit present.     Mental Status: She is alert and oriented to person, place,  and time.     Cranial Nerves: No cranial nerve deficit.     Sensory: No sensory deficit.     Motor: No weakness.  Psychiatric:        Mood and Affect: Mood normal.        Thought Content: Thought content normal.  Condition at discharge: stable  The results of significant diagnostics from this hospitalization (including imaging, microbiology, ancillary and laboratory) are listed below for reference.   Imaging Studies: DG Chest 2 View Result Date: 08/02/2023 CLINICAL DATA:  Shortness of breath. EXAM: CHEST - 2 VIEW COMPARISON:  07/16/2023. FINDINGS: Bilateral lung fields are clear. Bilateral costophrenic angles are clear. Normal cardio-mediastinal silhouette. No acute osseous abnormalities. The soft tissues are within normal limits. IMPRESSION: No active cardiopulmonary disease. Electronically Signed   By: Jules Schick M.D.   On: 08/02/2023 15:22   DG Chest 2 View Result Date: 07/16/2023 CLINICAL DATA:  Shortness of breath. EXAM: CHEST - 2 VIEW COMPARISON:  March 27, 2023 FINDINGS: The heart size and mediastinal contours are within normal limits. Mild areas of atelectasis and/or infiltrate are seen within the bilateral lung bases. No pleural effusion or pneumothorax is identified. The visualized skeletal structures are  unremarkable. IMPRESSION: Mild bibasilar atelectasis and/or infiltrate. Electronically Signed   By: Aram Candela M.D.   On: 07/16/2023 22:50    Microbiology: Results for orders placed or performed during the hospital encounter of 08/02/23  Resp panel by RT-PCR (RSV, Flu A&B, Covid) Anterior Nasal Swab     Status: Abnormal   Collection Time: 08/02/23  1:37 PM   Specimen: Anterior Nasal Swab  Result Value Ref Range Status   SARS Coronavirus 2 by RT PCR NEGATIVE NEGATIVE Final    Comment: (NOTE) SARS-CoV-2 target nucleic acids are NOT DETECTED.  The SARS-CoV-2 RNA is generally detectable in upper respiratory specimens during the acute phase of infection. The lowest concentration of SARS-CoV-2 viral copies this assay can detect is 138 copies/mL. A negative result does not preclude SARS-Cov-2 infection and should not be used as the sole basis for treatment or other patient management decisions. A negative result may occur with  improper specimen collection/handling, submission of specimen other than nasopharyngeal swab, presence of viral mutation(s) within the areas targeted by this assay, and inadequate number of viral copies(<138 copies/mL). A negative result must be combined with clinical observations, patient history, and epidemiological information. The expected result is Negative.  Fact Sheet for Patients:  BloggerCourse.com  Fact Sheet for Healthcare Providers:  SeriousBroker.it  This test is no t yet approved or cleared by the [place] FDA and  has been authorized for detection and/or diagnosis of SARS-CoV-2 by FDA under an Emergency Use Authorization (EUA). This EUA will remain  in effect (meaning this test can be used) for the duration of the COVID-19 declaration under Section 564(b)(1) of the Act, 21 U.S.C.section 360bbb-3(b)(1), unless the authorization is terminated  or revoked sooner.       Influenza A by  PCR POSITIVE (A) NEGATIVE Final   Influenza B by PCR NEGATIVE NEGATIVE Final    Comment: (NOTE) The Xpert Xpress SARS-CoV-2/FLU/RSV plus assay is intended as an aid in the diagnosis of influenza from Nasopharyngeal swab specimens and should not be used as a sole basis for treatment. Nasal washings and aspirates are unacceptable for Xpert Xpress SARS-CoV-2/FLU/RSV testing.  Fact Sheet for Patients: BloggerCourse.com  Fact Sheet for Healthcare Providers: SeriousBroker.it  This test is not yet approved or cleared by the [place]  FDA and has been authorized for detection and/or diagnosis of SARS-CoV-2 by FDA under an Emergency Use Authorization (EUA). This EUA will remain in effect (meaning this test can be used) for the duration of the COVID-19 declaration under Section 564(b)(1) of the Act, 21 U.S.C. section 360bbb-3(b)(1), unless the authorization is terminated or revoked.     Resp Syncytial Virus by PCR NEGATIVE NEGATIVE Final    Comment: (NOTE) Fact Sheet for Patients: BloggerCourse.com  Fact Sheet for Healthcare Providers: SeriousBroker.it  This test is not yet approved or cleared by the [place] FDA and has been authorized for detection and/or diagnosis of SARS-CoV-2 by FDA under an Emergency Use Authorization (EUA). This EUA will remain in effect (meaning this test can be used) for the duration of the COVID-19 declaration under Section 564(b)(1) of the Act, 21 U.S.C. section 360bbb-3(b)(1), unless the authorization is terminated or revoked.  Performed at [facility], [address] [place], [place] [address]     Labs: CBC: Recent Labs  Lab 08/02/23 1336 08/03/23 0559  WBC 11.5* 9.5  HGB 14.7 14.1  HCT 44.5 42.7  MCV 94.3 95.5  PLT 122* 104*   Basic Metabolic Panel: Recent Labs  Lab 08/02/23 1336 08/03/23 0559 08/04/23 0500  NA 133*  132* 134*  K 4.4 4.1 4.5  CL 96* 97* 97*  CO2 24 23 26   GLUCOSE 178* 169* 168*  BUN <5* 7 11  CREATININE 0.75 0.66 0.54  CALCIUM 8.9 8.5* 7.9*  MG  --   --  1.9   Liver Function Tests: Recent Labs  Lab 08/02/23 1336 08/03/23 0559 08/04/23 0500 08/05/23 0417  AST 115* 172* 203* 199*  ALT 49* 58* 69* 78*  ALKPHOS 193* 171* 171* 151*  BILITOT 0.7 0.7 0.5 0.3  PROT 7.3 7.0 6.7 6.5  ALBUMIN 3.3* 3.3* 3.2* 2.9*   CBG: No results for input(s): "GLUCAP" in the last 168 hours.  Discharge time spent: less than 30 minutes.  Signed: Ernestene Mention, MD [facility] 08/06/2023

## 2023-08-07 ENCOUNTER — Telehealth: Payer: BC Managed Care – PPO

## 2023-08-07 ENCOUNTER — Telehealth: Payer: Self-pay

## 2023-08-07 ENCOUNTER — Ambulatory Visit: Payer: Self-pay | Admitting: Family Medicine

## 2023-08-07 NOTE — Transitions of Care (Post Inpatient/ED Visit) (Signed)
08/07/2023  Name: Carly [name] MRN: [id number] DOB: 08-23-1970  Today's TOC FU Call Status: Today's TOC FU Call Status:: Successful TOC FU Call Completed TOC FU Call Complete Date: 08/07/23 Patient's Name and Date of Birth confirmed.  Transition Care Management Follow-up Telephone Call Date of Discharge: 08/06/23 Discharge Facility: [facility]) Type of Discharge: Inpatient Admission Primary Inpatient Discharge Diagnosis:: Flu, COPD exacerbation How have you been since you were released from the hospital?: Better Any questions or concerns?: No  Items Reviewed: Did you receive and understand the discharge instructions provided?: Yes Medications obtained,verified, and reconciled?: Yes (Medications Reviewed) Any new allergies since your discharge?: No Dietary orders reviewed?: NA Do you have support at home?: Yes People in Home: spouse, sibling(s) Name of Support/Comfort Primary Source: Nikayla Litalien  Medications Reviewed Today: Medications Reviewed Today     Reviewed by Redge Gainer, RN (Case Manager) on 08/07/23 at 1037  Med List Status: <None>   Medication Order Taking? Sig Documenting Provider Last Dose Status Informant  acetaminophen (TYLENOL) 500 MG tablet [id number] No Take 500-1,000 mg by mouth every 6 (six) hours as needed for mild pain or headache. [provider] Past Week Active Self           Med Note Sharia Reeve   GEX Aug 03, 2023  3:35 PM) prn  albuterol (PROVENTIL) (2.5 MG/3ML) 0.083% nebulizer solution [id number] No Take 3 mLs (2.5 mg total) by nebulization every 6 (six) hours as needed for wheezing or shortness of breath. Roxy Horseman, PA-C 08/02/2023 Active Self  albuterol (VENTOLIN HFA) 108 (90 Base) MCG/ACT inhaler [id number] No Inhale 2 puffs into the lungs every 6 (six) hours as needed for wheezing or shortness of breath. Delorse Lek, FNP 08/02/2023 Active Self  amLODipine (NORVASC) 10 MG tablet [id number]  No Take 10 mg by mouth daily. [provider] 08/02/2023 Active Self  ARIPiprazole (ABILIFY) 2 MG tablet [id number] No Take 2 tablets (4 mg total) by mouth daily. Dc rexulti not covered  Patient taking differently: Take 2 mg by mouth 2 (two) times daily.   Dana Allan, MD 08/02/2023 Active Self  benzonatate (TESSALON) 100 MG capsule [id number] No Take 1-2 capsules (100-200 mg total) by mouth 3 (three) times daily as needed. Freddy Finner, NP 08/02/2023 Active Self  BREZTRI AEROSPHERE 160-9-4.8 MCG/ACT AERO [id number] No INHALE 2 PUFFS INTO THE LUNGS IN THE MORNING AND AT BEDTIME. Salena Saner, MD 08/02/2023 Active Self  cyanocobalamin (VITAMIN B12) 1000 MCG/ML injection [id number] No INJECT 1 ML (1,000 MCG TOTAL) INTO THE MUSCLE EVERY 30 DAYS.  Patient not taking: Reported on 08/03/2023   Dana Allan, MD Not Taking Active Self  D3-50 1.25 MG (50000 UT) capsule [id number] No Take 50,000 Units by mouth once a week. [provider] Past Week Active Self  escitalopram (LEXAPRO) 20 MG tablet [id number] No Take 1 tablet (20 mg total) by mouth daily. Dana Allan, MD 08/02/2023 Active Self  lamoTRIgine (LAMICTAL) 25 MG tablet [id number] No Take 50 mg by mouth daily. [provider] 08/02/2023 Active Self  metoprolol succinate (TOPROL-XL) 100 MG 24 hr tablet [id number] No TAKE 1 TABLET BY MOUTH DAILY. TAKE WITH OR IMMEDIATELY FOLLOWING A MEAL. Dana Allan, MD 08/02/2023 Active Self  nicotine (NICODERM CQ - DOSED IN MG/24 HOURS) 14 mg/24hr patch [id number] No Place 1 patch (14 mg total) onto the skin daily. Leeroy Bock, MD 08/02/2023 Active Self  omeprazole (PRILOSEC) 20 MG capsule [id number] No Take 1 capsule (20  mg total) by mouth daily. Dana Allan, MD 08/02/2023 Active Self  oseltamivir (TAMIFLU) 75 MG capsule [id number]  Take 1 capsule (75 mg total) by mouth 2 (two) times daily for 2 days. Ernestene Mention, MD  Active   oxybutynin (DITROPAN-XL) 5 MG 24 hr tablet [id number] No  TAKE 1 TABLET BY MOUTH EVERYDAY AT BEDTIME Dana Allan, MD Past Week Active Self  predniSONE (DELTASONE) 20 MG tablet [id number]  Take 2 tablets (40 mg total) by mouth daily with breakfast for 2 days. Ernestene Mention, MD  Active   traZODone (DESYREL) 50 MG tablet [id number] No TAKE 0.5-1 TABLETS BY MOUTH AT BEDTIME AS NEEDED FOR SLEEP. Dana Allan, MD Past Week Active Self  Varenicline Tartrate, Starter, (CHANTIX STARTING MONTH PAK) 0.5 MG X 11 & 1 MG X 42 TBPK [id number]  Take as directed in the package that this is a starter pack. Salena Saner, MD  Active Self            Home Care and Equipment/Supplies: Were Home Health Services Ordered?: NA Any new equipment or medical supplies ordered?: NA  Functional Questionnaire: Do you need assistance with bathing/showering or dressing?: No Do you need assistance with meal preparation?: No Do you need assistance with eating?: No Do you have difficulty maintaining continence: No Do you need assistance with getting out of bed/getting out of a chair/moving?: No Do you have difficulty managing or taking your medications?: No  Follow up appointments reviewed: PCP Follow-up appointment confirmed?: No MD Provider Line [phone] Given: No (The patient will call and schedule. She is not sure she is with Dana Allan) Specialist Hospital Follow-up appointment confirmed?: NA Do you need transportation to your follow-up appointment?: No Do you understand care options if your condition(s) worsen?: Yes-patient verbalized understanding  SDOH Interventions Today    Flowsheet Row Most Recent Value  SDOH Interventions   Food Insecurity Interventions Intervention Not Indicated  Housing Interventions Intervention Not Indicated  Transportation Interventions Intervention Not Indicated  Utilities Interventions Intervention Not Indicated  Social Connections Interventions Intervention Not Indicated      Interventions not completed. The  patient was not available at this phone number. The patient's cousin answers the phone and states the patient is at her sister's house and she doesn't know the correct number to contact her. She states that she is unsure if Dana Allan is the PCP. The cousin will have the patient contact [facility] to establish care or she will find another PCP.   Deidre Ala, RN Medical illustrator [facility] [phone]

## 2023-08-07 NOTE — Telephone Encounter (Signed)
Copied from CRM [id number]. Topic: Clinical - Red Word Triage >> Aug 07, 2023 11:49 AM Denese Killings wrote: Red Word that prompted transfer to Nurse Triage: Patient is recovering from the flu, nagging cough, and pain on the left side of stomach when coughing. Patient states that medication prescribed from hospital is not working and wants to be prescribed something else.   Chief Complaint: cough  Symptoms: runny nose and muscle pain from coughing  Frequency: ongoing since 08/02/23 Pertinent Negatives: Patient denies fever Disposition: [] ED /[] Urgent Care (no appt availability in office) / [] Appointment(In office/virtual)/ []  [place] [facility]/ [] Home Care/ [] Refused Recommended Disposition /[] [facility]/ [x]  Follow-up with PCP Additional Notes: The patient was discharged from the hospital yesterday.  She has a follow up with her pcp schedule for next week.  She reported feeling better than last week but she has an ongoing cough that is preventing her from resting.  She feels like she must have pulled something in her side from coughing so much because she has a pain on her left side between her lower stomach and ribs.  She has been taking Tessalon pearls and mucinex but they have not helped with the cough.  She is requesting a recommendation for something else to take for her cough.  She is currently taking prednisone, an inhaler, and nebulizer treatment along with the Tessalon pearls and mucinex.  Routed to pcp for recommendations. Reason for Disposition  Influenza, questions about  Answer Assessment - Initial Assessment Questions 1. DIAGNOSIS CONFIRMATION: "When was the influenza diagnosed?" "By whom?" "Did you get a test for it?"     08/02/23 2. MEDICINES: "Were you prescribed any medications for the influenza?"  (e.g., zanamivir [Relenza], oseltamavir [Tamiflu]).      Tessalon pearls Prednisone, nebulizer, inhaler  3. ONSET of SYMPTOMS: "When did your symptoms start?"      08/02/23 4. SYMPTOMS: "What symptoms are you most concerned about?" (e.g., runny nose, stuffy nose, sore throat, cough, breathing difficulty, fever)     Stuffy nose - using nose spray that seems to be helping  5. COUGH: "How bad is the cough?"     Constant  6. FEVER: "Do you have a fever?" If Yes, ask: "What is your temperature, how was it measured, and when did it start?"     No fever since Saturday  7. RESPIRATORY DISTRESS: "Are you having any trouble breathing?" If Yes, ask: "Describe your breathing."      Denied shortness of breath  Protocols used: Influenza Follow-up Call-A-[facility]

## 2023-08-07 NOTE — Telephone Encounter (Signed)
Pt has an appointment with you in 08/09/2023.

## 2023-08-09 ENCOUNTER — Inpatient Hospital Stay: Payer: BC Managed Care – PPO | Admitting: Family Medicine

## 2023-08-10 ENCOUNTER — Telehealth: Payer: BC Managed Care – PPO | Admitting: Nurse Practitioner

## 2023-08-10 DIAGNOSIS — J069 Acute upper respiratory infection, unspecified: Secondary | ICD-10-CM

## 2023-08-10 MED ORDER — PROMETHAZINE-DM 6.25-15 MG/5ML PO SYRP: 5.0000 mL | ORAL_SOLUTION | Freq: Four times a day (QID) | ORAL | 0 refills | Status: DC | PRN

## 2023-08-10 NOTE — Progress Notes (Signed)
I have spent 5 minutes in review of e-visit questionnaire, review and updating patient chart, medical decision making and response to patient.   Claiborne Rigg, NP

## 2023-08-10 NOTE — Progress Notes (Signed)
E-Visit for Cough   We are sorry that you are not feeling well.  Here is how we plan to help!  Based on your presentation I believe you most likely have A cough due to a virus.  This is called viral bronchitis and is best treated by rest, plenty of fluids and control of the cough.  You may use Ibuprofen or Tylenol as directed to help your symptoms.     In addition you may use a prescription cough syrup that was sent in: promethazine DM  From your responses in the eVisit questionnaire you describe inflammation in the upper respiratory tract which is causing a significant cough.  This is commonly called Bronchitis and has four common causes:   Allergies Viral Infections Acid Reflux Bacterial Infection Allergies, viruses and acid reflux are treated by controlling symptoms or eliminating the cause. An example might be a cough caused by taking certain blood pressure medications. You stop the cough by changing the medication. Another example might be a cough caused by acid reflux. Controlling the reflux helps control the cough.  USE OF BRONCHODILATOR ("RESCUE") INHALERS: There is a risk from using your bronchodilator too frequently.  The risk is that over-reliance on a medication which only relaxes the muscles surrounding the breathing tubes can reduce the effectiveness of medications prescribed to reduce swelling and congestion of the tubes themselves.  Although you feel brief relief from the bronchodilator inhaler, your asthma may actually be worsening with the tubes becoming more swollen and filled with mucus.  This can delay other crucial treatments, such as oral steroid medications. If you need to use a bronchodilator inhaler daily, several times per day, you should discuss this with your provider.  There are probably better treatments that could be used to keep your asthma under control.     HOME CARE Only take medications as instructed by your medical team. Complete the entire course of an  antibiotic. Drink plenty of fluids and get plenty of rest. Avoid close contacts especially the very young and the elderly Cover your mouth if you cough or cough into your sleeve. Always remember to wash your hands A steam or ultrasonic humidifier can help congestion.   GET HELP RIGHT AWAY IF: You develop worsening fever. You become short of breath You cough up blood. Your symptoms persist after you have completed your treatment plan MAKE SURE YOU  Understand these instructions. Will watch your condition. Will get help right away if you are not doing well or get worse.    Thank you for choosing an e-visit.  Your e-visit answers were reviewed by a board certified advanced clinical practitioner to complete your personal care plan. Depending upon the condition, your plan could have included both over the counter or prescription medications.  Please review your pharmacy choice. Make sure the pharmacy is open so you can pick up prescription now. If there is a problem, you may contact your provider through [employer] and have the prescription routed to another pharmacy.  Your safety is important to [place]. If you have drug allergies check your prescription carefully.   For the next 24 hours you can use MyChart to ask questions about today's visit, request a non-urgent call back, or ask for a work or school excuse. You will get an email in the next two days asking about your experience. I hope that your e-visit has been valuable and will speed your recovery.

## 2023-08-12 ENCOUNTER — Telehealth: Payer: Self-pay | Admitting: Family Medicine

## 2023-08-12 NOTE — Telephone Encounter (Signed)
Provider is out sick, called patient left VM and sent mychart to cancel appointment. Please reschedule appointment at next available Hospital Follow up open slot.

## 2023-08-13 ENCOUNTER — Inpatient Hospital Stay: Payer: BC Managed Care – PPO | Admitting: Family Medicine

## 2023-08-13 NOTE — Telephone Encounter (Signed)
Called pt and she stated that she was getting better, and did not want to make an appointment to be seen.

## 2023-08-20 ENCOUNTER — Ambulatory Visit: Payer: BC Managed Care – PPO | Admitting: Nurse Practitioner

## 2023-08-20 ENCOUNTER — Encounter: Payer: Self-pay | Admitting: Nurse Practitioner

## 2023-08-20 VITALS — BP 126/78 | HR 106 | Temp 97.9°F | Ht 62.0 in | Wt 221.6 lb

## 2023-08-20 DIAGNOSIS — R1012 Left upper quadrant pain: Secondary | ICD-10-CM | POA: Diagnosis not present

## 2023-08-20 NOTE — Patient Instructions (Signed)
 Carly [name], a 53 year old female, visited us  today due to left upper abdominal pain that started after a recent flu infection. The pain is triggered by certain movements, coughing, or sneezing, and is localized near the rib area above the navel. It has persisted for about two to three weeks without significant improvement despite using Tylenol , Flexeril , and a warming blanket for relief. There are no changes in bowel movements, no nausea, vomiting, or pain with eating or using the bathroom.  YOUR PLAN:  -ABDOMINAL PAIN: Abdominal pain in the left upper quadrant can be caused by various conditions. In your case, it started after a flu infection and is triggered by movement, coughing, or sneezing. We will order an abdominal ultrasound to rule out other causes of pain and check your liver function tests due to previous elevation. Continue using warm compresses as tolerated for relief.

## 2023-08-20 NOTE — Progress Notes (Signed)
 Established Patient Office Visit  Subjective:  Patient ID: Carly [name], female    DOB: 12-18-1970  Age: 53 y.o. MRN: [id number]  CC:  Chief Complaint  Patient presents with   Acute Visit    Left side Stomach pain since 08/02/23 when she had the flu pain 5/ 10  Discussed the use of a AI scribe software for clinical note transcription with the patient, who gave verbal consent to proceed.   HPI  Carly [name] presents for left upper abdominal pain.  She experiences left upper abdominal pain that began following a recent flu infection for which she was hospitalized on January 17th. The pain is triggered by certain movements, coughing, or sneezing. It does not radiate and is absent when she is still. The pain has persisted for about two to three weeks without significant improvement.  She manages the pain with Tylenol  and Flexeril , which she had at home, and uses a warming blanket at night for some relief. There is tenderness in the left upper abdominal area, but no pain in the back or other areas. Her bowel movements remain unchanged, and she experiences no pain with eating or difficulty using the bathroom.  No nausea, vomiting, or changes in bowel movements. No pain with eating or using the bathroom. No history of diverticulitis. Her liver function tests were slightly elevated during her hospital admission, but she reports no other significant health changes.   HPI   Past Medical History:  Diagnosis Date   Abnormal uterine bleeding (AUB) 08/26/2018   Anxiety    C. difficile diarrhea    07/04/21   Chicken pox    Depression    Depression, major, single episode, moderate (HCC) 08/04/2018   Diarrhea 06/07/2022   Dyspnea on exertion 05/24/2022   Elevated testosterone  level in female    Emphysema of lung (HCC)    bronchitis   Frequent headaches    Generalized anxiety disorder 08/23/2014   Hay fever    Hypertension    Idiopathic thrombocytopenic purpura (ITP) (HCC)    Iron  deficiency 09/02/2017   Positive ANA (antinuclear antibody)    Thrombocytopenia (HCC) 02/12/2016    Past Surgical History:  Procedure Laterality Date   HEMATOMA EVACUATION N/A 04/07/2020   Procedure: EVACUATION HEMATOMA  AND APPLICATION OF PRESSURE DRESSING;  Surgeon: Verdon Keen, MD;  Location: [facility] ORS;  Service: Gynecology;  Laterality: N/A;   HYSTEROSCOPY WITH NOVASURE N/A 02/21/2018   Procedure: HYSTEROSCOPY WITH NOVASURE, POLYPECTOMY;  Surgeon: Verdon Keen, MD;  Location: [facility];  Service: Gynecology;  Laterality: N/A;   TUBAL LIGATION  1997   TUBAL LIGATION      Family History  Problem Relation Age of Onset   Hyperlipidemia Mother    Heart disease Mother    Stroke Mother    Depression Mother    Mental illness Mother    Heart attack Mother 18   Hyperlipidemia Father    Depression Father    Mental illness Father    Diabetes Paternal Grandfather    Cancer Cousin     Social History   Socioeconomic History   Marital status: Married    Spouse name: Not on file   Number of children: Not on file   Years of education: Not on file   Highest education level: Not on file  Occupational History   Not on file  Tobacco Use   Smoking status: Every Day    Current packs/day: 1.50    Average packs/day: 1.5 packs/day for 28.0  years (42.0 ttl pk-yrs)    Types: Cigarettes   Smokeless tobacco: Never   Tobacco comments:    1 PPD khj 07/03/2023  Vaping Use   Vaping status: Never Used  Substance and Sexual Activity   Alcohol use: Yes    Alcohol/week: 8.0 standard drinks of alcohol    Types: 8 Standard drinks or equivalent per week    Comment: Beer (Can)    Drug use: No    Comment: CBD oil   Sexual activity: Yes    Partners: Male  Other Topics Concern   Not on file  Social History Narrative   1 Year college    Works in a Warehouse- [employer]   Married to [place]    Enjoys coloring    Pets: Dog lives inside   Children: 63 Son 69 Son   Social Drivers  of Corporate Investment Banker Strain: Not on file  Food Insecurity: No Food Insecurity (08/07/2023)   Hunger Vital Sign    Worried About Running Out of Food in the Last Year: Never true    Ran Out of Food in the Last Year: Never true  Transportation Needs: No Transportation Needs (08/07/2023)   PRAPARE - Administrator, Civil Service (Medical): No    Lack of Transportation (Non-Medical): No  Physical Activity: Not on file  Stress: Not on file  Social Connections: Moderately Isolated (08/07/2023)   Social Connection and Isolation Panel [NHANES]    Frequency of Communication with Friends and Family: More than three times a week    Frequency of Social Gatherings with Friends and Family: More than three times a week    Attends Religious Services: Never    Database Administrator or Organizations: No    Attends Banker Meetings: Never    Marital Status: Married  Catering Manager Violence: Not At Risk (08/07/2023)   Humiliation, Afraid, Rape, and Kick questionnaire    Fear of Current or Ex-Partner: No    Emotionally Abused: No    Physically Abused: No    Sexually Abused: No     Outpatient Medications Prior to Visit  Medication Sig Dispense Refill   acetaminophen  (TYLENOL ) 500 MG tablet Take 500-1,000 mg by mouth every 6 (six) hours as needed for mild pain or headache.     albuterol  (PROVENTIL ) (2.5 MG/3ML) 0.083% nebulizer solution Take 3 mLs (2.5 mg total) by nebulization every 6 (six) hours as needed for wheezing or shortness of breath. 150 mL 1   albuterol  (VENTOLIN  HFA) 108 (90 Base) MCG/ACT inhaler Inhale 2 puffs into the lungs every 6 (six) hours as needed for wheezing or shortness of breath. 8 g 0   amLODipine  (NORVASC ) 10 MG tablet Take 10 mg by mouth daily.     ARIPiprazole  (ABILIFY ) 2 MG tablet Take 2 tablets (4 mg total) by mouth daily. Dc rexulti  not covered (Patient taking differently: Take 2 mg by mouth 2 (two) times daily.)     BREZTRI  AEROSPHERE  160-9-4.8 MCG/ACT AERO INHALE 2 PUFFS INTO THE LUNGS IN THE MORNING AND AT BEDTIME. 10.7 each 5   cyanocobalamin  (VITAMIN B12) 1000 MCG/ML injection INJECT 1 ML (1,000 MCG TOTAL) INTO THE MUSCLE EVERY 30 DAYS. 1 mL 15   D3-50 1.25 MG (50000 UT) capsule Take 50,000 Units by mouth once a week.     escitalopram  (LEXAPRO ) 20 MG tablet Take 1 tablet (20 mg total) by mouth daily.     lamoTRIgine  (LAMICTAL ) 25 MG  tablet Take 50 mg by mouth daily.     metoprolol  succinate (TOPROL -XL) 100 MG 24 hr tablet TAKE 1 TABLET BY MOUTH DAILY. TAKE WITH OR IMMEDIATELY FOLLOWING A MEAL. 30 tablet 2   omeprazole  (PRILOSEC) 20 MG capsule Take 1 capsule (20 mg total) by mouth daily. 90 capsule 1   oxybutynin  (DITROPAN -XL) 5 MG 24 hr tablet TAKE 1 TABLET BY MOUTH EVERYDAY AT BEDTIME 30 tablet 0   traZODone  (DESYREL ) 50 MG tablet TAKE 0.5-1 TABLETS BY MOUTH AT BEDTIME AS NEEDED FOR SLEEP.     benzonatate  (TESSALON ) 100 MG capsule Take 1-2 capsules (100-200 mg total) by mouth 3 (three) times daily as needed. 30 capsule 0   nicotine  (NICODERM CQ  - DOSED IN MG/24 HOURS) 14 mg/24hr patch Place 1 patch (14 mg total) onto the skin daily. 28 patch 0   promethazine -dextromethorphan  (PROMETHAZINE -DM) 6.25-15 MG/5ML syrup Take 5 mLs by mouth 4 (four) times daily as needed for cough. 240 mL 0   Varenicline  Tartrate, Starter, (CHANTIX  STARTING MONTH PAK) 0.5 MG X 11 & 1 MG X 42 TBPK Take as directed in the package that this is a starter pack. 42 each 0   No facility-administered medications prior to visit.    Allergies  Allergen Reactions   Wellbutrin  [Bupropion ]     Crazy    Meloxicam  Nausea Only    ROS Review of Systems Negative unless indicated in HPI.    Objective:    Physical Exam Constitutional:      Appearance: Normal appearance.  Cardiovascular:     Rate and Rhythm: Normal rate and regular rhythm.     Pulses: Normal pulses.     Heart sounds: Normal heart sounds.  Abdominal:     General: Bowel sounds are  normal.     Palpations: Abdomen is soft.     Tenderness: There is abdominal tenderness in the left upper quadrant. There is no right CVA tenderness or left CVA tenderness.  Musculoskeletal:     Cervical back: Normal range of motion.  Neurological:     General: No focal deficit present.     Mental Status: She is alert. Mental status is at baseline.  Psychiatric:        Mood and Affect: Mood normal.        Behavior: Behavior normal.        Thought Content: Thought content normal.        Judgment: Judgment normal.     BP 126/78   Pulse (!) 106   Temp 97.9 F (36.6 C)   Ht 5' 2 (1.575 m)   Wt 221 lb 9.6 oz (100.5 kg)   SpO2 96%   BMI 40.53 kg/m  Wt Readings from Last 3 Encounters:  08/20/23 221 lb 9.6 oz (100.5 kg)  08/02/23 223 lb 8.7 oz (101.4 kg)  07/17/23 223 lb 8.7 oz (101.4 kg)     Health Maintenance  Topic Date Due   Pneumococcal Vaccine 66-1 Years old (1 of 2 - PCV) Never done   Zoster Vaccines- Shingrix (1 of 2) Never done   Fecal DNA (Cologuard)  Never done   Cervical Cancer Screening (HPV/Pap Cotest)  09/07/2017   MAMMOGRAM  07/20/2021   INFLUENZA VACCINE  10/14/2023 (Originally 02/14/2023)   Lung Cancer Screening  03/26/2024   DTaP/Tdap/Td (2 - Td or Tdap) 08/23/2024   Hepatitis C Screening  Completed   HIV Screening  Completed   HPV VACCINES  Aged Out   COVID-19 Vaccine  Discontinued  There are no preventive care reminders to display for this patient.  Lab Results  Component Value Date   TSH 2.03 11/06/2022   Lab Results  Component Value Date   WBC 9.5 08/03/2023   HGB 14.1 08/03/2023   HCT 42.7 08/03/2023   MCV 95.5 08/03/2023   PLT 104 (L) 08/03/2023   Lab Results  Component Value Date   NA 137 08/20/2023   K 3.5 08/20/2023   CO2 29 08/20/2023   GLUCOSE 92 08/20/2023   BUN 6 08/20/2023   CREATININE 0.53 08/20/2023   BILITOT 1.7 (H) 08/20/2023   ALKPHOS 184 (H) 08/20/2023   AST 54 (H) 08/20/2023   ALT 29 08/20/2023   PROT 6.4  08/20/2023   ALBUMIN 3.5 08/20/2023   CALCIUM 8.3 (L) 08/20/2023   ANIONGAP 11 08/04/2023   EGFR 110 05/04/2021   GFR 106.58 08/20/2023   Lab Results  Component Value Date   CHOL 170 11/06/2022   Lab Results  Component Value Date   HDL 46.20 11/06/2022   Lab Results  Component Value Date   LDLCALC 103 (H) 11/06/2022   Lab Results  Component Value Date   TRIG 101.0 11/06/2022   Lab Results  Component Value Date   CHOLHDL 4 11/06/2022   Lab Results  Component Value Date   HGBA1C 5.4 11/06/2022      Assessment & Plan:  Left upper quadrant abdominal pain Assessment & Plan: Left upper quadrant pain, intermittent, exacerbated by movement and coughing. No change in bowel movements, no nausea or vomiting. Pain has been present for approximately 3 weeks, since a recent flu infection. No improvement with Tylenol  or Flexeril . -Order abdominal ultrasound to rule out other causes of pain. -Check liver function tests due to previous elevation. -Continue use of warm compresses as tolerated.  Orders: -     Basic metabolic panel -     Hepatic function panel -     US  Abdomen Complete; Future -     Lipase; Future    Follow-up: No follow-ups on file.   Youcef Klas, NP

## 2023-08-21 DIAGNOSIS — R1012 Left upper quadrant pain: Secondary | ICD-10-CM | POA: Insufficient documentation

## 2023-08-21 LAB — HEPATIC FUNCTION PANEL
ALT: 29 U/L (ref 0–35)
AST: 54 U/L — ABNORMAL HIGH (ref 0–37)
Albumin: 3.5 g/dL (ref 3.5–5.2)
Alkaline Phosphatase: 184 U/L — ABNORMAL HIGH (ref 39–117)
Bilirubin, Direct: 0.3 mg/dL (ref 0.0–0.3)
Total Bilirubin: 1.7 mg/dL — ABNORMAL HIGH (ref 0.2–1.2)
Total Protein: 6.4 g/dL (ref 6.0–8.3)

## 2023-08-21 LAB — BASIC METABOLIC PANEL
BUN: 6 mg/dL (ref 6–23)
CO2: 29 meq/L (ref 19–32)
Calcium: 8.3 mg/dL — ABNORMAL LOW (ref 8.4–10.5)
Chloride: 95 meq/L — ABNORMAL LOW (ref 96–112)
Creatinine, Ser: 0.53 mg/dL (ref 0.40–1.20)
GFR: 106.58 mL/min (ref >60.00)
Glucose, Bld: 92 mg/dL (ref 70–99)
Potassium: 3.5 meq/L (ref 3.5–5.1)
Sodium: 137 meq/L (ref 135–145)

## 2023-08-21 NOTE — Assessment & Plan Note (Signed)
 Left upper quadrant pain, intermittent, exacerbated by movement and coughing. No change in bowel movements, no nausea or vomiting. Pain has been present for approximately 3 weeks, since a recent flu infection. No improvement with Tylenol  or Flexeril . -Order abdominal ultrasound to rule out other causes of pain. -Check liver function tests due to previous elevation. -Continue use of warm compresses as tolerated.

## 2023-08-22 ENCOUNTER — Ambulatory Visit: Payer: BC Managed Care – PPO

## 2023-08-22 DIAGNOSIS — R1012 Left upper quadrant pain: Secondary | ICD-10-CM | POA: Diagnosis not present

## 2023-08-22 LAB — LIPASE: Lipase: 22 U/L (ref 11.0–59.0)

## 2023-08-28 ENCOUNTER — Other Ambulatory Visit: Payer: Self-pay | Admitting: Family Medicine

## 2023-08-28 DIAGNOSIS — I10 Essential (primary) hypertension: Secondary | ICD-10-CM

## 2023-08-28 DIAGNOSIS — K219 Gastro-esophageal reflux disease without esophagitis: Secondary | ICD-10-CM

## 2023-09-03 ENCOUNTER — Telehealth: Payer: BC Managed Care – PPO | Admitting: Physician Assistant

## 2023-09-03 DIAGNOSIS — J4489 Other specified chronic obstructive pulmonary disease: Secondary | ICD-10-CM

## 2023-09-03 MED ORDER — BENZONATATE 100 MG PO CAPS: 100.0000 mg | ORAL_CAPSULE | Freq: Three times a day (TID) | ORAL | 0 refills | Status: DC | PRN

## 2023-09-03 MED ORDER — DOXYCYCLINE HYCLATE 100 MG PO TABS: 100.0000 mg | ORAL_TABLET | Freq: Two times a day (BID) | ORAL | 0 refills | Status: DC

## 2023-09-03 NOTE — Progress Notes (Signed)

## 2023-09-16 ENCOUNTER — Telehealth: Admitting: Family Medicine

## 2023-09-16 ENCOUNTER — Encounter

## 2023-09-16 DIAGNOSIS — J4489 Other specified chronic obstructive pulmonary disease: Secondary | ICD-10-CM

## 2023-09-16 NOTE — Progress Notes (Signed)
 [place]   COPD Flare note improving, and had EMS called over weekend due to oxygen level dropping. She is advised to be seen in person UC or ED is desired, but wants to try PCP or Pulm office first- oxygen level is stable as of now, but notes shortness of breath, but nebs are not working. She has been seen for COPD Bronchitis treatment this month by our group.  Patient acknowledged agreement and understanding of the plan.

## 2023-09-17 ENCOUNTER — Encounter: Payer: Self-pay | Admitting: Pulmonary Disease

## 2023-09-17 DIAGNOSIS — J4489 Other specified chronic obstructive pulmonary disease: Secondary | ICD-10-CM

## 2023-09-17 MED ORDER — DOXYCYCLINE HYCLATE 100 MG PO TABS: 100.0000 mg | ORAL_TABLET | Freq: Two times a day (BID) | ORAL | 0 refills | Status: AC

## 2023-09-17 MED ORDER — METHYLPREDNISOLONE 4 MG PO TBPK: ORAL_TABLET | ORAL | 0 refills | Status: DC

## 2023-09-17 NOTE — Telephone Encounter (Signed)
 Sent prescription for Medrol Dosepak and doxycycline.  She needs to quit smoking.

## 2023-10-01 ENCOUNTER — Other Ambulatory Visit: Payer: Self-pay | Admitting: Pulmonary Disease

## 2023-10-02 ENCOUNTER — Ambulatory Visit: Payer: BC Managed Care – PPO | Admitting: Pulmonary Disease

## 2023-10-03 ENCOUNTER — Telehealth: Admitting: Physician Assistant

## 2023-10-03 DIAGNOSIS — A084 Viral intestinal infection, unspecified: Secondary | ICD-10-CM

## 2023-10-03 MED ORDER — ONDANSETRON 4 MG PO TBDP: 4.0000 mg | ORAL_TABLET | Freq: Three times a day (TID) | ORAL | 0 refills | Status: DC | PRN

## 2023-10-03 NOTE — Progress Notes (Signed)
 E-Visit for Nausea and Vomiting   We are sorry that you are not feeling well. Here is how we plan to help!  Based on what you have shared with me it looks like you have a Virus that is irritating your GI tract.  Vomiting is the forceful emptying of a portion of the stomach's content through the mouth.  Although nausea and vomiting can make you feel miserable, it's important to remember that these are not diseases, but rather symptoms of an underlying illness.  When we treat short term symptoms, we always caution that any symptoms that persist should be fully evaluated in a medical office.  I have prescribed a medication that will help alleviate your symptoms and allow you to stay hydrated:  Zofran 4 mg 1 tablet every 8 hours as needed for nausea and vomiting -- I am prescribing since you have taken with current medications before without issue.   HOME CARE: Drink clear liquids.  This is very important! Dehydration (the lack of fluid) can lead to a serious complication.  Start off with 1 tablespoon every 5 minutes for 8 hours. You may begin eating bland foods after 8 hours without vomiting.  Start with saltine crackers, white bread, rice, mashed potatoes, applesauce. After 48 hours on a bland diet, you may resume a normal diet. Try to go to sleep.  Sleep often empties the stomach and relieves the need to vomit.  GET HELP RIGHT AWAY IF:  Your symptoms do not improve or worsen within 2 days after treatment. You have a fever for over 3 days. You cannot keep down fluids after trying the medication.  MAKE SURE YOU:  Understand these instructions. Will watch your condition. Will get help right away if you are not doing well or get worse.    Thank you for choosing an e-visit.  Your e-visit answers were reviewed by a board certified advanced clinical practitioner to complete your personal care plan. Depending upon the condition, your plan could have included both over the counter or  prescription medications.  Please review your pharmacy choice. Make sure the pharmacy is open so you can pick up prescription now. If there is a problem, you may contact your provider through [employer] and have the prescription routed to another pharmacy.  Your safety is important to [place]. If you have drug allergies check your prescription carefully.   For the next 24 hours you can use MyChart to ask questions about today's visit, request a non-urgent call back, or ask for a work or school excuse. You will get an email in the next two days asking about your experience. I hope that your e-visit has been valuable and will speed your recovery.

## 2023-10-03 NOTE — Progress Notes (Signed)
 I have spent 5 minutes in review of e-visit questionnaire, review and updating patient chart, medical decision making and response to patient.   Piedad Climes, PA-C

## 2023-10-03 NOTE — Progress Notes (Signed)
 Message sent to patient requesting further input regarding current symptoms. Awaiting patient response.

## 2023-10-08 ENCOUNTER — Ambulatory Visit: Admitting: Pulmonary Disease

## 2023-10-08 ENCOUNTER — Ambulatory Visit

## 2023-10-14 ENCOUNTER — Ambulatory Visit: Admission: RE | Admit: 2023-10-14 | Source: Ambulatory Visit

## 2023-10-16 ENCOUNTER — Inpatient Hospital Stay

## 2023-10-16 ENCOUNTER — Inpatient Hospital Stay: Admitting: Oncology

## 2023-10-28 ENCOUNTER — Inpatient Hospital Stay

## 2023-10-28 ENCOUNTER — Inpatient Hospital Stay: Admitting: Oncology

## 2023-11-04 ENCOUNTER — Inpatient Hospital Stay

## 2023-11-04 ENCOUNTER — Inpatient Hospital Stay: Admitting: Oncology

## 2023-11-06 ENCOUNTER — Encounter: Payer: Self-pay | Admitting: Oncology

## 2023-11-06 ENCOUNTER — Inpatient Hospital Stay: Attending: Oncology

## 2023-11-06 ENCOUNTER — Inpatient Hospital Stay (HOSPITAL_BASED_OUTPATIENT_CLINIC_OR_DEPARTMENT_OTHER): Admitting: Oncology

## 2023-11-06 ENCOUNTER — Inpatient Hospital Stay

## 2023-11-06 VITALS — BP 122/71 | HR 91 | Temp 98.1°F | Resp 14 | Wt 222.0 lb

## 2023-11-06 DIAGNOSIS — D693 Immune thrombocytopenic purpura: Secondary | ICD-10-CM

## 2023-11-06 LAB — CBC WITH DIFFERENTIAL/PLATELET
Abs Immature Granulocytes: 0.05 10*3/uL (ref 0.00–0.07)
Basophils Absolute: 0.1 10*3/uL (ref 0.0–0.1)
Basophils Relative: 1 %
Eosinophils Absolute: 0.2 10*3/uL (ref 0.0–0.5)
Eosinophils Relative: 3 %
HCT: 39.9 % (ref 36.0–46.0)
Hemoglobin: 13.4 g/dL (ref 12.0–15.0)
Immature Granulocytes: 1 %
Lymphocytes Relative: 24 %
Lymphs Abs: 2.2 10*3/uL (ref 0.7–4.0)
MCH: 32.3 pg (ref 26.0–34.0)
MCHC: 33.6 g/dL (ref 30.0–36.0)
MCV: 96.1 fL (ref 80.0–100.0)
Monocytes Absolute: 0.8 10*3/uL (ref 0.1–1.0)
Monocytes Relative: 9 %
Neutro Abs: 5.7 10*3/uL (ref 1.7–7.7)
Neutrophils Relative %: 62 %
Platelets: 32 10*3/uL — ABNORMAL LOW (ref 150–400)
RBC: 4.15 MIL/uL (ref 3.87–5.11)
RDW: 15.9 % — ABNORMAL HIGH (ref 11.5–15.5)
WBC: 9 10*3/uL (ref 4.0–10.5)
nRBC: 0 % (ref 0.0–0.2)

## 2023-11-06 MED ORDER — ROMIPLOSTIM INJECTION 500 MCG
625.0000 ug | Freq: Once | SUBCUTANEOUS | Status: AC
  Administered 2023-11-06: 625 ug via SUBCUTANEOUS
  Filled 2023-11-06: qty 1

## 2023-11-06 NOTE — Progress Notes (Signed)
 [facility]  [phone] [phone]  ID: Carly [name] OB: October 13, 1970  MR#: [id number]  EXB#:[id number]  Patient Care Team: Valli Gaw, MD as PCP - General (Family Medicine) Shellie Dials, MD as Consulting Physician (Oncology) Marc Senior, MD as Consulting Physician (Pulmonary Disease)   CHIEF COMPLAINT: ITP.  INTERVAL HISTORY: Patient returns to clinic today for repeat laboratory work, further evaluation, and continuation of Nplate .  She currently feels well and is asymptomatic.  She does not complain of easy bleeding or bruising.  She has no neurologic complaints. She has a good appetite and denies weight loss.  She denies any chest pain, shortness of breath, cough, or hemoptysis. She denies any nausea, vomiting, constipation, or diarrhea.  She has no urinary complaints.  Patient offers no specific complaints today.  REVIEW OF SYSTEMS:   Review of Systems  Constitutional: Negative.  Negative for fever, malaise/fatigue and weight loss.  Respiratory: Negative.  Negative for cough, hemoptysis and shortness of breath.   Cardiovascular: Negative.  Negative for chest pain and leg swelling.  Gastrointestinal: Negative.  Negative for abdominal pain, blood in stool and melena.  Genitourinary: Negative.  Negative for hematuria.  Musculoskeletal: Negative.  Negative for back pain.  Skin: Negative.  Negative for rash.  Neurological: Negative.  Negative for dizziness, focal weakness, weakness and headaches.  Endo/Heme/Allergies: Negative.  Does not bruise/bleed easily.  Psychiatric/Behavioral: Negative.  The patient is not nervous/anxious.     As per HPI. Otherwise, a complete review of systems is negative.  PAST MEDICAL HISTORY: Past Medical History:  Diagnosis Date   Abnormal uterine bleeding (AUB) 08/26/2018   Anxiety    C. difficile diarrhea    07/04/21   Chicken pox    Depression    Depression, major, single episode, moderate  (HCC) 08/04/2018   Diarrhea 06/07/2022   Dyspnea on exertion 05/24/2022   Elevated testosterone  level in female    Emphysema of lung (HCC)    bronchitis   Frequent headaches    Generalized anxiety disorder 08/23/2014   Hay fever    Hypertension    Idiopathic thrombocytopenic purpura (ITP) (HCC)    Iron deficiency 09/02/2017   Positive ANA (antinuclear antibody)    Thrombocytopenia (HCC) 02/12/2016    PAST SURGICAL HISTORY: Past Surgical History:  Procedure Laterality Date   HEMATOMA EVACUATION N/A 04/07/2020   Procedure: EVACUATION HEMATOMA  AND APPLICATION OF PRESSURE DRESSING;  Surgeon: Prescilla Brod, MD;  Location: [facility];  Service: Gynecology;  Laterality: N/A;   HYSTEROSCOPY WITH NOVASURE N/A 02/21/2018   Procedure: HYSTEROSCOPY WITH NOVASURE, POLYPECTOMY;  Surgeon: Prescilla Brod, MD;  Location: [facility];  Service: Gynecology;  Laterality: N/A;   TUBAL LIGATION  1997   TUBAL LIGATION      FAMILY HISTORY: Family History  Problem Relation Age of Onset   Hyperlipidemia Mother    Heart disease Mother    Stroke Mother    Depression Mother    Mental illness Mother    Heart attack Mother 14   Hyperlipidemia Father    Depression Father    Mental illness Father    Diabetes Paternal Grandfather    Cancer Cousin     ADVANCED DIRECTIVES (Y/N):  N  HEALTH MAINTENANCE: Social History   Tobacco Use   Smoking status: Every Day    Current packs/day: 1.50    Average packs/day: 1.5 packs/day for 28.0 years (42.0 ttl pk-yrs)    Types: Cigarettes   Smokeless tobacco: Never   Tobacco comments:  1 PPD khj 07/03/2023  Vaping Use   Vaping status: Never Used  Substance Use Topics   Alcohol use: Yes    Alcohol/week: 8.0 standard drinks of alcohol    Types: 8 Standard drinks or equivalent per week    Comment: Beer (Can)    Drug use: No    Comment: CBD oil     Colonoscopy:  PAP:  Bone density:  Lipid panel:  Allergies  Allergen Reactions   Wellbutrin   [Bupropion ]     Crazy    Meloxicam  Nausea Only    Current Outpatient Medications  Medication Sig Dispense Refill   acetaminophen  (TYLENOL ) 500 MG tablet Take 500-1,000 mg by mouth every 6 (six) hours as needed for mild pain or headache.     albuterol  (PROVENTIL ) (2.5 MG/3ML) 0.083% nebulizer solution Take 3 mLs (2.5 mg total) by nebulization every 6 (six) hours as needed for wheezing or shortness of breath. 150 mL 1   albuterol  (VENTOLIN  HFA) 108 (90 Base) MCG/ACT inhaler INHALE 2 PUFFS INTO THE LUNGS EVERY 6 HOURS AS NEEDED FOR WHEEZING OR SHORTNESS OF BREATH (COUGH) 6.7 each 3   amLODipine  (NORVASC ) 10 MG tablet Take 10 mg by mouth daily.     ARIPiprazole  (ABILIFY ) 2 MG tablet Take 2 tablets (4 mg total) by mouth daily. Dc rexulti  not covered (Patient taking differently: Take 2 mg by mouth 2 (two) times daily.)     benzonatate  (TESSALON ) 100 MG capsule Take 1-2 capsules (100-200 mg total) by mouth 3 (three) times daily as needed. 30 capsule 0   BREZTRI  AEROSPHERE 160-9-4.8 MCG/ACT AERO INHALE 2 PUFFS INTO THE LUNGS IN THE MORNING AND AT BEDTIME. 10.7 each 5   cyanocobalamin  (VITAMIN B12) 1000 MCG/ML injection INJECT 1 ML (1,000 MCG TOTAL) INTO THE MUSCLE EVERY 30 DAYS. 1 mL 15   D3-50 1.25 MG (50000 UT) capsule Take 50,000 Units by mouth once a week.     escitalopram  (LEXAPRO ) 20 MG tablet Take 1 tablet (20 mg total) by mouth daily.     lamoTRIgine  (LAMICTAL ) 25 MG tablet Take 50 mg by mouth daily.     metoprolol  succinate (TOPROL -XL) 100 MG 24 hr tablet TAKE 1 TABLET BY MOUTH DAILY. TAKE WITH OR IMMEDIATELY FOLLOWING A MEAL. 30 tablet 2   omeprazole  (PRILOSEC) 20 MG capsule TAKE 1 CAPSULE BY MOUTH EVERY DAY 90 capsule 1   ondansetron  (ZOFRAN -ODT) 4 MG disintegrating tablet Take 1 tablet (4 mg total) by mouth every 8 (eight) hours as needed for nausea or vomiting. 20 tablet 0   oxybutynin  (DITROPAN -XL) 5 MG 24 hr tablet TAKE 1 TABLET BY MOUTH EVERYDAY AT BEDTIME 30 tablet 0   traZODone   (DESYREL ) 50 MG tablet TAKE 0.5-1 TABLETS BY MOUTH AT BEDTIME AS NEEDED FOR SLEEP.     No current facility-administered medications for this visit.    OBJECTIVE: Vitals:   11/06/23 1317  BP: 122/71  Pulse: 91  Resp: 14  Temp: 98.1 F (36.7 C)  SpO2: 97%     Body mass index is 40.6 kg/m.    ECOG FS:0 - Asymptomatic  General: Well-developed, well-nourished, no acute distress. Eyes: Pink conjunctiva, anicteric sclera. HEENT: Normocephalic, moist mucous membranes. Lungs: No audible wheezing or coughing. Heart: Regular rate and rhythm. Abdomen: Soft, nontender, no obvious distention. Musculoskeletal: No edema, cyanosis, or clubbing. Neuro: Alert, answering all questions appropriately. Cranial nerves grossly intact. Skin: No rashes or petechiae noted. Psych: Normal affect.  LAB RESULTS:  Lab Results  Component Value Date   NA  137 08/20/2023   K 3.5 08/20/2023   CL 95 (L) 08/20/2023   CO2 29 08/20/2023   GLUCOSE 92 08/20/2023   BUN 6 08/20/2023   CREATININE 0.53 08/20/2023   CALCIUM 8.3 (L) 08/20/2023   PROT 6.4 08/20/2023   ALBUMIN 3.5 08/20/2023   AST 54 (H) 08/20/2023   ALT 29 08/20/2023   ALKPHOS 184 (H) 08/20/2023   BILITOT 1.7 (H) 08/20/2023   GFRNONAA >60 08/04/2023   GFRAA >60 04/08/2020    Lab Results  Component Value Date   WBC 9.0 11/06/2023   NEUTROABS 5.7 11/06/2023   HGB 13.4 11/06/2023   HCT 39.9 11/06/2023   MCV 96.1 11/06/2023   PLT 32 (L) 11/06/2023     STUDIES: No results found.  ASSESSMENT: ITP  PLAN:    ITP: Confirmed with normal bone marrow biopsy and normal FISH and cytogenetics on July 22, 2019.  Patient had a positive ANA which is likely clinically insignificant.  The remainder of her laboratory work was either negative or within normal limits.  Her most recent abdominal imaging with CT scan on April 08, 2021 did not reveal any splenomegaly.  Previously, patient received 1 week of prednisone  at 1 mg/kg dose and had no  appreciable change in her platelet count.  Rituxan was denied by insurance. In January 2024 patient's platelet count increased from 38-191 with 6 mcg/kg of Nplate .  When patient received treatment on March 07, 2023 her platelet count was 36. One month later during her admission of the hospital platelet count was 39, indicating patient likely would benefit from treatment every 4 weeks.  Patient has not received treatment since July 19, 2023.  Her platelet count is 32 today.  Proceed with treatment as scheduled.  Return to clinic in 6 weeks for laboratory work and Nplate  only.  Patient will then return to clinic in 3 months for further evaluation and continuation of treatment. History of positive ANA: Likely clinically insignificant.  Referral was previously sent to rheumatology.  I spent a total of 30 minutes reviewing chart data, face-to-face evaluation with the patient, counseling and coordination of care as detailed above.   Patient expressed understanding and was in agreement with this plan. She also understands that She can call clinic at any time with any questions, concerns, or complaints.    Shellie Dials, MD   11/06/2023 1:23 PM

## 2023-11-09 ENCOUNTER — Encounter: Payer: Self-pay | Admitting: Oncology

## 2023-11-13 ENCOUNTER — Other Ambulatory Visit: Payer: Self-pay | Admitting: Pulmonary Disease

## 2023-11-18 ENCOUNTER — Telehealth: Payer: Self-pay

## 2023-11-18 NOTE — Telephone Encounter (Signed)
 Noted.

## 2023-11-18 NOTE — Telephone Encounter (Signed)
 Copied from CRM [id number]. Topic: Appointments - Transfer of Care >> Nov 18, 2023 12:49 PM Dyann Glaser G wrote: Pt is requesting to transfer FROM: [facility] Pt is requesting to transfer TO: [facility] Reason for requested transfer: PROVIDER  It is the responsibility of the team the patient would like to transfer to (Dr. Casimir Cleaver) to reach out to the patient if for any reason this transfer is not acceptable.

## 2023-11-20 ENCOUNTER — Telehealth: Admitting: Family Medicine

## 2023-11-20 DIAGNOSIS — J4489 Other specified chronic obstructive pulmonary disease: Secondary | ICD-10-CM

## 2023-11-20 NOTE — Progress Notes (Signed)
 Carly [name],  You will need to be seen in person for this flare. We have treated several of them in the last 5 months. You need to be evaluated to see if your inhalers and meds can be adjusted to help prevent this from happening so frequently.     NOTE: There will be NO CHARGE for this E-Visit   If you are having a true medical emergency, please call 911.

## 2023-11-21 ENCOUNTER — Observation Stay
Admission: EM | Admit: 2023-11-21 | Discharge: 2023-11-24 | Disposition: A | Discharge status: Home / Self Care | Attending: Internal Medicine | Admitting: Internal Medicine

## 2023-11-21 ENCOUNTER — Emergency Department

## 2023-11-21 ENCOUNTER — Encounter: Payer: Self-pay | Admitting: Emergency Medicine

## 2023-11-21 ENCOUNTER — Other Ambulatory Visit: Payer: Self-pay

## 2023-11-21 DIAGNOSIS — D696 Thrombocytopenia, unspecified: Secondary | ICD-10-CM | POA: Diagnosis not present

## 2023-11-21 DIAGNOSIS — I10 Essential (primary) hypertension: Secondary | ICD-10-CM | POA: Diagnosis present

## 2023-11-21 DIAGNOSIS — Z6841 Body Mass Index (BMI) 40.0 and over, adult: Secondary | ICD-10-CM | POA: Diagnosis not present

## 2023-11-21 DIAGNOSIS — J9601 Acute respiratory failure with hypoxia: Secondary | ICD-10-CM | POA: Diagnosis not present

## 2023-11-21 DIAGNOSIS — F32A Depression, unspecified: Secondary | ICD-10-CM | POA: Diagnosis not present

## 2023-11-21 DIAGNOSIS — Z79899 Other long term (current) drug therapy: Secondary | ICD-10-CM | POA: Insufficient documentation

## 2023-11-21 DIAGNOSIS — J441 Chronic obstructive pulmonary disease with (acute) exacerbation: Principal | ICD-10-CM | POA: Diagnosis present

## 2023-11-21 DIAGNOSIS — K219 Gastro-esophageal reflux disease without esophagitis: Secondary | ICD-10-CM | POA: Diagnosis present

## 2023-11-21 DIAGNOSIS — F17210 Nicotine dependence, cigarettes, uncomplicated: Secondary | ICD-10-CM | POA: Insufficient documentation

## 2023-11-21 DIAGNOSIS — F419 Anxiety disorder, unspecified: Secondary | ICD-10-CM | POA: Insufficient documentation

## 2023-11-21 DIAGNOSIS — E66813 Obesity, class 3: Secondary | ICD-10-CM | POA: Diagnosis not present

## 2023-11-21 DIAGNOSIS — E876 Hypokalemia: Secondary | ICD-10-CM | POA: Diagnosis not present

## 2023-11-21 DIAGNOSIS — Z862 Personal history of diseases of the blood and blood-forming organs and certain disorders involving the immune mechanism: Secondary | ICD-10-CM

## 2023-11-21 DIAGNOSIS — F330 Major depressive disorder, recurrent, mild: Secondary | ICD-10-CM

## 2023-11-21 DIAGNOSIS — E785 Hyperlipidemia, unspecified: Secondary | ICD-10-CM | POA: Diagnosis present

## 2023-11-21 DIAGNOSIS — R059 Cough, unspecified: Secondary | ICD-10-CM | POA: Diagnosis not present

## 2023-11-21 DIAGNOSIS — J4489 Other specified chronic obstructive pulmonary disease: Secondary | ICD-10-CM

## 2023-11-21 DIAGNOSIS — R0602 Shortness of breath: Secondary | ICD-10-CM | POA: Diagnosis present

## 2023-11-21 DIAGNOSIS — Z720 Tobacco use: Secondary | ICD-10-CM | POA: Diagnosis not present

## 2023-11-21 LAB — CBC
HCT: 41.5 % (ref 36.0–46.0)
Hemoglobin: 13.4 g/dL (ref 12.0–15.0)
MCH: 32.1 pg (ref 26.0–34.0)
MCHC: 32.3 g/dL (ref 30.0–36.0)
MCV: 99.3 fL (ref 80.0–100.0)
Platelets: 145 10*3/uL — ABNORMAL LOW (ref 150–400)
RBC: 4.18 MIL/uL (ref 3.87–5.11)
RDW: 16.3 % — ABNORMAL HIGH (ref 11.5–15.5)
WBC: 11.7 10*3/uL — ABNORMAL HIGH (ref 4.0–10.5)
nRBC: 0 % (ref 0.0–0.2)

## 2023-11-21 LAB — BASIC METABOLIC PANEL WITH GFR
Anion gap: 14 (ref 5–15)
BUN: 6 mg/dL (ref 6–20)
CO2: 26 mmol/L (ref 22–32)
Calcium: 8.4 mg/dL — ABNORMAL LOW (ref 8.9–10.3)
Chloride: 101 mmol/L (ref 98–111)
Creatinine, Ser: 0.48 mg/dL (ref 0.44–1.00)
GFR, Estimated: >60 mL/min (ref >60)
Glucose, Bld: 163 mg/dL — ABNORMAL HIGH (ref 70–99)
Potassium: 3.4 mmol/L — ABNORMAL LOW (ref 3.5–5.1)
Sodium: 141 mmol/L (ref 135–145)

## 2023-11-21 LAB — RESP PANEL BY RT-PCR (RSV, FLU A&B, COVID)  RVPGX2
Influenza A by PCR: NEGATIVE
Influenza B by PCR: NEGATIVE
Resp Syncytial Virus by PCR: NEGATIVE
SARS Coronavirus 2 by RT PCR: NEGATIVE

## 2023-11-21 LAB — TROPONIN I (HIGH SENSITIVITY): Troponin I (High Sensitivity): 20 ng/L — ABNORMAL HIGH (ref <18)

## 2023-11-21 LAB — HIV ANTIBODY (ROUTINE TESTING W REFLEX): HIV Screen 4th Generation wRfx: NONREACTIVE

## 2023-11-21 MED ORDER — ACETAMINOPHEN 325 MG PO TABS
650.0000 mg | ORAL_TABLET | Freq: Four times a day (QID) | ORAL | Status: DC | PRN
  Administered 2023-11-21: 650 mg via ORAL
  Filled 2023-11-21: qty 2

## 2023-11-21 MED ORDER — LAMOTRIGINE 100 MG PO TABS
50.0000 mg | ORAL_TABLET | Freq: Every day | ORAL | Status: DC
  Administered 2023-11-21 – 2023-11-24 (×4): 50 mg via ORAL
  Filled 2023-11-21 (×2): qty 1
  Filled 2023-11-21 (×2): qty 2
  Filled 2023-11-21: qty 1

## 2023-11-21 MED ORDER — ALBUTEROL SULFATE (2.5 MG/3ML) 0.083% IN NEBU
5.0000 mg | INHALATION_SOLUTION | Freq: Once | RESPIRATORY_TRACT | Status: AC
  Administered 2023-11-21: 5 mg via RESPIRATORY_TRACT
  Filled 2023-11-21: qty 6

## 2023-11-21 MED ORDER — PANTOPRAZOLE SODIUM 40 MG PO TBEC
40.0000 mg | DELAYED_RELEASE_TABLET | Freq: Every day | ORAL | Status: DC
  Administered 2023-11-21 – 2023-11-24 (×4): 40 mg via ORAL
  Filled 2023-11-21 (×4): qty 1

## 2023-11-21 MED ORDER — METOPROLOL SUCCINATE ER 50 MG PO TB24
100.0000 mg | ORAL_TABLET | Freq: Every day | ORAL | Status: DC
  Administered 2023-11-22 – 2023-11-24 (×3): 100 mg via ORAL
  Filled 2023-11-21 (×3): qty 2

## 2023-11-21 MED ORDER — ALBUTEROL SULFATE (2.5 MG/3ML) 0.083% IN NEBU: 2.5000 mg | INHALATION_SOLUTION | RESPIRATORY_TRACT | Status: DC | PRN

## 2023-11-21 MED ORDER — IPRATROPIUM-ALBUTEROL 0.5-2.5 (3) MG/3ML IN SOLN
9.0000 mL | Freq: Once | RESPIRATORY_TRACT | Status: AC
  Administered 2023-11-21: 9 mL via RESPIRATORY_TRACT
  Filled 2023-11-21: qty 9

## 2023-11-21 MED ORDER — PREDNISONE 20 MG PO TABS
40.0000 mg | ORAL_TABLET | Freq: Every day | ORAL | Status: DC
  Administered 2023-11-23 – 2023-11-24 (×2): 40 mg via ORAL
  Filled 2023-11-21 (×2): qty 2

## 2023-11-21 MED ORDER — SODIUM CHLORIDE 0.9 % IV BOLUS
1000.0000 mL | Freq: Once | INTRAVENOUS | Status: AC
  Administered 2023-11-21: 1000 mL via INTRAVENOUS

## 2023-11-21 MED ORDER — METHYLPREDNISOLONE SODIUM SUCC 40 MG IJ SOLR
40.0000 mg | Freq: Two times a day (BID) | INTRAMUSCULAR | Status: AC
  Administered 2023-11-21 – 2023-11-22 (×2): 40 mg via INTRAVENOUS
  Filled 2023-11-21 (×3): qty 1

## 2023-11-21 MED ORDER — PREDNISONE 20 MG PO TABS
60.0000 mg | ORAL_TABLET | Freq: Once | ORAL | Status: AC
  Administered 2023-11-21: 60 mg via ORAL
  Filled 2023-11-21: qty 3

## 2023-11-21 MED ORDER — MAGNESIUM SULFATE 2 GM/50ML IV SOLN
2.0000 g | INTRAVENOUS | Status: AC
  Administered 2023-11-21: 2 g via INTRAVENOUS
  Filled 2023-11-21: qty 50

## 2023-11-21 MED ORDER — BENZONATATE 100 MG PO CAPS
200.0000 mg | ORAL_CAPSULE | Freq: Three times a day (TID) | ORAL | Status: DC | PRN
  Administered 2023-11-21 – 2023-11-24 (×6): 200 mg via ORAL
  Filled 2023-11-21 (×6): qty 2

## 2023-11-21 MED ORDER — NICOTINE 14 MG/24HR TD PT24
14.0000 mg | MEDICATED_PATCH | Freq: Every day | TRANSDERMAL | Status: DC
  Administered 2023-11-21 – 2023-11-24 (×4): 14 mg via TRANSDERMAL
  Filled 2023-11-21 (×5): qty 1

## 2023-11-21 MED ORDER — SODIUM CHLORIDE 0.9 % IV SOLN
100.0000 mg | Freq: Two times a day (BID) | INTRAVENOUS | Status: DC
  Administered 2023-11-21 – 2023-11-24 (×6): 100 mg via INTRAVENOUS
  Filled 2023-11-21 (×7): qty 100

## 2023-11-21 MED ORDER — LEVOFLOXACIN 750 MG PO TABS
750.0000 mg | ORAL_TABLET | Freq: Once | ORAL | Status: AC
Start: 2023-11-21 — End: 2023-11-21
  Administered 2023-11-21: 750 mg via ORAL
  Filled 2023-11-21: qty 1

## 2023-11-21 MED ORDER — IPRATROPIUM-ALBUTEROL 0.5-2.5 (3) MG/3ML IN SOLN
3.0000 mL | Freq: Four times a day (QID) | RESPIRATORY_TRACT | Status: DC
  Administered 2023-11-21 – 2023-11-23 (×8): 3 mL via RESPIRATORY_TRACT
  Filled 2023-11-21 (×8): qty 3

## 2023-11-21 MED ORDER — ENOXAPARIN SODIUM 60 MG/0.6ML IJ SOSY
50.0000 mg | PREFILLED_SYRINGE | INTRAMUSCULAR | Status: DC
  Administered 2023-11-21 – 2023-11-23 (×3): 50 mg via SUBCUTANEOUS
  Filled 2023-11-21 (×4): qty 0.6

## 2023-11-21 MED ORDER — GUAIFENESIN-DM 100-10 MG/5ML PO SYRP
5.0000 mL | ORAL_SOLUTION | ORAL | Status: DC | PRN
  Administered 2023-11-22 – 2023-11-24 (×6): 5 mL via ORAL
  Filled 2023-11-21 (×6): qty 10

## 2023-11-21 NOTE — H&P (Addendum)
 History and Physical    Patient: Carly [name] ZOX:[id number] DOB: 04-28-1971 DOA: 11/21/2023 DOS: the patient was seen and examined on 11/21/2023 PCP: Jacklin Mascot, MD  Patient coming from: Home  Chief Complaint:  Chief Complaint  Patient presents with   Shortness of Breath   HPI: Carly [name] is a 53 y.o. female with medical history significant of obesity, depression, COPD, ITP presented with acute respiratory failure with hypoxia, COPD exacerbation.  Patient reports approximate 2 days of increased work of breathing cough and wheezing.  Still smoking up to 1/2 to 1 pack/day.  No chest pain.  No abdominal pain.  Positive progressive increased work of breathing at home despite home inhaler use.  Does not wear oxygen.  No reported sick contacts.  No focal hemiparesis or confusion.  No diarrhea.  Mild nausea. Presented to the ER afebrile, heart rate 100s, BP stable in the upper 80s to room air.  Transition liters nasal cannula to keep O2 sats greater than 95%.  White count 12, hemoglobin 13.4, platelets 145 with baseline platelets around the low 100s.  COVID flu and RSV negative.  Creatinine 0.48.  Troponin in the low 20s.  Chest x-ray within normal Review of Systems: As mentioned in the history of present illness. All other systems reviewed and are negative. Past Medical History:  Diagnosis Date   Abnormal uterine bleeding (AUB) 08/26/2018   Anxiety    C. difficile diarrhea    07/04/21   Chicken pox    Depression    Depression, major, single episode, moderate (HCC) 08/04/2018   Diarrhea 06/07/2022   Dyspnea on exertion 05/24/2022   Elevated testosterone  level in female    Emphysema of lung (HCC)    bronchitis   Frequent headaches    Generalized anxiety disorder 08/23/2014   Hay fever    Hypertension    Idiopathic thrombocytopenic purpura (ITP) (HCC)    Iron deficiency 09/02/2017   Positive ANA (antinuclear antibody)    Thrombocytopenia (HCC) 02/12/2016   Past Surgical History:   Procedure Laterality Date   HEMATOMA EVACUATION N/A 04/07/2020   Procedure: EVACUATION HEMATOMA  AND APPLICATION OF PRESSURE DRESSING;  Surgeon: Prescilla Brod, MD;  Location: [facility] OR[facility];  Service: Gynecology;  Laterality: N/A;   HYSTEROSCOPY WITH NOVASURE N/A 02/21/2018   Procedure: HYSTEROSCOPY WITH NOVASURE, POLYPECTOMY;  Surgeon: Prescilla Brod, MD;  Location: [facility] OR[facility];  Service: Gynecology;  Laterality: N/A;   TUBAL LIGATION  1997   TUBAL LIGATION     Social History:  reports that she has been smoking cigarettes. She has a 42 pack-year smoking history. She has never used smokeless tobacco. She reports current alcohol use of about 8.0 standard drinks of alcohol per week. She reports that she does not use drugs.  Allergies  Allergen Reactions   Wellbutrin  [Bupropion ]     Crazy    Meloxicam  Nausea Only    Family History  Problem Relation Age of Onset   Hyperlipidemia Mother    Heart disease Mother    Stroke Mother    Depression Mother    Mental illness Mother    Heart attack Mother 60   Hyperlipidemia Father    Depression Father    Mental illness Father    Diabetes Paternal Grandfather    Cancer Cousin     Prior to Admission medications   Medication Sig Start Date End Date Taking? Authorizing Provider  acetaminophen  (TYLENOL ) 500 MG tablet Take 500-1,000 mg by mouth every 6 (six) hours as needed for mild  pain or headache.    [provider]  albuterol  (PROVENTIL ) (2.5 MG/3ML) 0.083% nebulizer solution Take 3 mLs (2.5 mg total) by nebulization every 6 (six) hours as needed for wheezing or shortness of breath. 08/02/23   Sherel Dikes, PA-C  albuterol  (VENTOLIN  HFA) 108 (90 Base) MCG/ACT inhaler INHALE 2 PUFFS INTO THE LUNGS EVERY 6 HOURS AS NEEDED FOR WHEEZING OR SHORTNESS OF BREATH (COUGH) 10/01/23   Marc Senior, MD  amLODipine  (NORVASC ) 10 MG tablet Take 10 mg by mouth daily. 04/24/23   [provider]  ARIPiprazole  (ABILIFY ) 2 MG tablet Take 2  tablets (4 mg total) by mouth daily. Dc rexulti  not covered Patient taking differently: Take 2 mg by mouth 2 (two) times daily. 02/23/23   Valli Gaw, MD  benzonatate  (TESSALON ) 100 MG capsule Take 1-2 capsules (100-200 mg total) by mouth 3 (three) times daily as needed. 09/03/23   Angelia Kelp, PA-C  BREZTRI  AEROSPHERE 160-9-4.8 MCG/ACT AERO inhaler INHALE 2 PUFFS INTO THE LUNGS IN THE MORNING AND AT BEDTIME. 11/13/23   Marc Senior, MD  cyanocobalamin  (VITAMIN B12) 1000 MCG/ML injection INJECT 1 ML (1,000 MCG TOTAL) INTO THE MUSCLE EVERY 30 DAYS. 08/06/22   Valli Gaw, MD  D3-50 1.25 MG (50000 UT) capsule Take 50,000 Units by mouth once a week. 04/24/23   [provider]  escitalopram  (LEXAPRO ) 20 MG tablet Take 1 tablet (20 mg total) by mouth daily. 02/23/23   Valli Gaw, MD  lamoTRIgine  (LAMICTAL ) 25 MG tablet Take 50 mg by mouth daily. 06/18/23   [provider]  metoprolol  succinate (TOPROL -XL) 100 MG 24 hr tablet TAKE 1 TABLET BY MOUTH DAILY. TAKE WITH OR IMMEDIATELY FOLLOWING A MEAL. 08/28/23   Valli Gaw, MD  omeprazole  (PRILOSEC) 20 MG capsule TAKE 1 CAPSULE BY MOUTH EVERY DAY 08/28/23   Valli Gaw, MD  ondansetron  (ZOFRAN -ODT) 4 MG disintegrating tablet Take 1 tablet (4 mg total) by mouth every 8 (eight) hours as needed for nausea or vomiting. 10/03/23   Farris Hong, PA-C  oxybutynin  (DITROPAN -XL) 5 MG 24 hr tablet TAKE 1 TABLET BY MOUTH EVERYDAY AT BEDTIME 05/22/23   Valli Gaw, MD  traZODone  (DESYREL ) 50 MG tablet TAKE 0.5-1 TABLETS BY MOUTH AT BEDTIME AS NEEDED FOR SLEEP. 02/23/23   Valli Gaw, MD    Physical Exam: Vitals:   11/21/23 0531 11/21/23 0532 11/21/23 0616 11/21/23 0958  BP: 117/70   120/70  Pulse: (!) 110  (!) 101 98  Resp: (!) 26  (!) 24 (!) 22  Temp: 98.5 F (36.9 C)   98 F (36.7 C)  TempSrc: Oral   Oral  SpO2: 90%  96% 95%  Weight:  99.8 kg    Height:  5\' 2"  (1.575 m)     Physical Exam Constitutional:      Appearance:  She is obese.     Comments: Mild increased WOB   HENT:     Head: Normocephalic and atraumatic.     Nose: Nose normal.     Mouth/Throat:     Mouth: Mucous membranes are moist.  Eyes:     Pupils: Pupils are equal, round, and reactive to light.  Cardiovascular:     Rate and Rhythm: Normal rate and regular rhythm.  Pulmonary:     Effort: Pulmonary effort is normal.     Breath sounds: Wheezing present.  Abdominal:     General: Bowel sounds are normal.  Musculoskeletal:        General: Normal range  of motion.  Skin:    General: Skin is warm.  Neurological:     General: No focal deficit present.  Psychiatric:        Mood and Affect: Mood normal.     Data Reviewed:  There are no new results to review at this time.  DG Chest 1 View CLINICAL DATA:  Shortness of breath for 2 days. Chest tightness. COPD.  EXAM: CHEST  1 VIEW  COMPARISON:  08/02/2023  FINDINGS: The heart size and mediastinal contours are within normal limits. Both lungs are clear. The visualized skeletal structures are unremarkable.  IMPRESSION: No active disease.  Electronically Signed   By: Marlyce Sine M.D.   On: 11/21/2023 06:00  Lab Results  Component Value Date   WBC 11.7 (H) 11/21/2023   HGB 13.4 11/21/2023   HCT 41.5 11/21/2023   MCV 99.3 11/21/2023   PLT 145 (L) 11/21/2023   Last metabolic panel Lab Results  Component Value Date   GLUCOSE 163 (H) 11/21/2023   NA 141 11/21/2023   K 3.4 (L) 11/21/2023   CL 101 11/21/2023   CO2 26 11/21/2023   BUN 6 11/21/2023   CREATININE 0.48 11/21/2023   GFRNONAA >60 11/21/2023   CALCIUM 8.4 (L) 11/21/2023   PHOS 1.8 (L) 09/10/2022   PROT 6.4 08/20/2023   ALBUMIN 3.5 08/20/2023   LABGLOB 3.1 05/04/2021   LABGLOB 2.5 05/04/2021   AGRATIO 1.3 05/04/2021   AGRATIO 1.8 05/04/2021   BILITOT 1.7 (H) 08/20/2023   ALKPHOS 184 (H) 08/20/2023   AST 54 (H) 08/20/2023   ALT 29 08/20/2023   ANIONGAP 14 11/21/2023    Assessment and Plan: Acute  respiratory failure with hypoxia (HCC) COPD exacerbation Decompensated respiratory status now requiring 2 L in the setting of COPD exacerbation Chest x-ray grossly stable Noted to cough or wheezing over most IV Solu-Medrol  IV doxy  DuoNebs Continue supplemental oxygen Monitor  Essential hypertension BP stable Titrate home regimen  Tobacco abuse 1/2 to 1 pack/day smoker Discussed cessation at length Nicotine  patch Monitor  Hyperlipidemia Continue statin  Gastroesophageal reflux disease without esophagitis PPI  Thrombocytopenia (HCC) Baseline ITP followed by Dr. Adrian Alba Platelets 145 today Monitor  Obesity, Class III, BMI 40-49.9 (morbid obesity) BMI 40.24 today Overall confounded to general health      Advance Care Planning:   Code Status: Prior   Consults: None   Family Communication: No family at the bedside   Severity of Illness: The appropriate patient status for this patient is OBSERVATION. Observation status is judged to be reasonable and necessary in order to provide the required intensity of service to ensure the patient's safety. The patient's presenting symptoms, physical exam findings, and initial radiographic and laboratory data in the context of their medical condition is felt to place them at decreased risk for further clinical deterioration. Furthermore, it is anticipated that the patient will be medically stable for discharge from the hospital within 2 midnights of admission.   Author: Corrinne Din, MD 11/21/2023 10:20 AM  For on call review [email or web address].

## 2023-11-21 NOTE — ED Notes (Signed)
 See triage note  Presents with increased SOB over the past 2 days  No n/v/ or fever  Hx of COPD  and has  used inhalers and SVN at home with min to no relief

## 2023-11-21 NOTE — ED Triage Notes (Signed)
 Patient ambulatory to triage with steady gait, SHOB noted with exertion and pt appears anxious; pt calms significantly when assisted into w/c; reports +smoker, hx COPD; last 2 days having increasing SHOB, chest tightness and nonprod cough; O2 sat 90% on ra and placed on 2l/min via [place] to bring sat to 93%

## 2023-11-21 NOTE — Assessment & Plan Note (Signed)
 Continue statin.

## 2023-11-21 NOTE — ED Notes (Signed)
 PT with patient

## 2023-11-21 NOTE — Assessment & Plan Note (Signed)
 PPI ?

## 2023-11-21 NOTE — Assessment & Plan Note (Signed)
 Baseline ITP followed by Dr. Adrian Alba Platelets 145 today Monitor

## 2023-11-21 NOTE — Assessment & Plan Note (Addendum)
 BMI 40.24 today Overall confounded to general health

## 2023-11-21 NOTE — ED Notes (Signed)
 Pt feel slightly improved until moving   then becomes

## 2023-11-21 NOTE — Assessment & Plan Note (Addendum)
 COPD exacerbation Decompensated respiratory status now requiring 2 L in the setting of COPD exacerbation Chest x-ray grossly stable Noted to cough or wheezing over most IV Solu-Medrol  IV doxy  DuoNebs Continue supplemental oxygen Monitor

## 2023-11-21 NOTE — Evaluation (Signed)
 Occupational Therapy Evaluation Patient Details Name: Carly [name] MRN: [id number] DOB: August 11, 1970 Today's Date: 11/21/2023   History of Present Illness   Pt is a 53 y.o. female presented with acute respiratory failure with hypoxia, COPD exacerbation. She reports ~2 days of increased work of breathing, cough and wheezing. Admitted for management of acute respiratory failure with hypoxia, COPD exacerbation. PMH of obesity, depression, COPD, ITP, tobacco abuse.     Clinical Impressions Pt was seen for OT evaluation this date. PTA, pt was living at home with her spouse in a 1 level home with level entry. 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 Denies falls. Uses a shower chair or sponge bathes. Pt presents to acute OT demonstrating impaired ADL performance and functional mobility 2/2 low activity tolerance. Pt currently requires MOD I for bed mobility, SUP for STS and CGA/SBA for mobility ~30 feet using RW with no LOB and x2 standing rest breaks. Edu on PLB, ECS, use of AE/AD and pacing to prevent overexertion. Pt on 3L at rest on entry with sp02 upper 90's. Placed on 2L for mobility with sp02 in mid 90's and placed on RA with return to seated EOB and sp02 stable at 94% with nurse aware she was left on RA. Pt has her own pulse ox and reports checking it on her own. LB dressing performed with MOD I seated EOB. Pt would benefit from skilled OT services to address noted impairments and functional limitations to maximize safety and independence while minimizing falls risk and caregiver burden. Pt may benefit from HHOT vs no OT follow up if her breathing improves.      If plan is discharge home, recommend the following:   A little help with bathing/dressing/bathroom;A little help with walking and/or transfers;Assistance with cooking/housework;Assist for transportation;Help with stairs or ramp for entrance     Functional Status  Assessment   Patient has had a recent decline in their functional status and demonstrates the ability to make significant improvements in function in a reasonable and predictable amount of time.     Equipment Recommendations   Other (comment) (RW)     Recommendations for Other Services         Precautions/Restrictions   Precautions Precautions: Fall Recall of Precautions/Restrictions: Intact Restrictions Weight Bearing Restrictions Per Provider Order: No     Mobility Bed Mobility Overal bed mobility: Modified Independent                  Transfers Overall transfer level: Needs assistance Equipment used: Rolling walker (2 wheels) Transfers: Sit to/from Stand Sit to Stand: Supervision           General transfer comment: SUP for STS from EOB to RW-did not assess without since pt reporting feeling unsteady walking without a device with spouse to bathroom prior to OT arrival; SBA with RW use to amb ~30 feet      Balance Overall balance assessment: Needs assistance Sitting-balance support: Feet supported Sitting balance-Leahy Scale: Normal     Standing balance support: Reliant on assistive device for balance, Bilateral upper extremity supported Standing balance-Leahy Scale: Fair Standing balance comment: RW use and CGA/SBA                           ADL either performed or assessed with clinical judgement   ADL Overall ADL's : At baseline  General ADL Comments: able to don socks seated EOB with MOD I, but does report increased fatigue; edu on ECS/use of AE/AD to limit bending and closing in on diaphragm     Vision         Perception         Praxis         Pertinent Vitals/Pain Pain Assessment Pain Assessment: 0-10 Pain Score: 3  Pain Location: headache Pain Descriptors / Indicators: Aching, Headache Pain Intervention(s): Monitored during session     Extremity/Trunk  Assessment Upper Extremity Assessment Upper Extremity Assessment: Overall WFL for tasks assessed   Lower Extremity Assessment Lower Extremity Assessment: Defer to PT evaluation;Generalized weakness (reported feeling better using RW)       Communication Communication Communication: No apparent difficulties   Cognition Arousal: Alert Behavior During Therapy: WFL for tasks assessed/performed Cognition: No apparent impairments                               Following commands: Intact       Cueing  General Comments   Cueing Techniques: Verbal cues  3L on entry with stable 02, dropped to 2L for mobility and maintained in mid 90's; placed on RA with return to room at rest and sp02 at 94%-nurse aware and to check in on pt   Exercises Other Exercises Other Exercises: Edu on role of OT in acute setting, ECS, use of AE/AD, pacing, and modifications to maximize IND/safety and prevent overexertion.   Shoulder Instructions      Home Living Family/patient expects to be discharged to:: Private residence Living Arrangements: Spouse/significant other Available Help at Discharge: Family;Available PRN/intermittently Type of Home: House Home Access: Level entry     Home Layout: One level     Bathroom Shower/Tub: Chief Strategy Officer: Standard     Home Equipment: Shower seat   Additional Comments: sometimes sponge bathe and sometimes stands in shower      Prior Functioning/Environment Prior Level of Function : Independent/Modified Independent;Driving;Working/employed             Mobility Comments: IND; 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    OT Problem List: Decreased activity tolerance   OT Treatment/Interventions: Self-care/ADL training;Balance training;Therapeutic exercise;Therapeutic activities;Energy conservation;DME and/or AE instruction;Patient/family education      OT Goals(Current goals can be found in the  care plan section)   Acute Rehab OT Goals Patient Stated Goal: return home OT Goal Formulation: With patient/family Time For Goal Achievement: 12/05/23 Potential to Achieve Goals: Fair ADL Goals Pt Will Perform Lower Body Bathing: with modified independence;sitting/lateral leans;sit to/from stand Pt Will Transfer to Toilet: with modified independence;ambulating;regular height toilet Additional ADL Goal #1: Pt will demo implementation of 1 learned ECS during ADL performance 2/2 trials to prevent overxertion.   OT Frequency:  Min 1X/week    Co-evaluation              AM-PAC OT "6 Clicks" Daily Activity     Outcome Measure Help from another person eating meals?: None Help from another person taking care of personal grooming?: None Help from another person toileting, which includes using toliet, bedpan, or urinal?: A Little Help from another person bathing (including washing, rinsing, drying)?: A Little Help from another person to put on and taking off regular upper body clothing?: None Help from another person to put on and taking off regular lower body clothing?: A Little  6 Click Score: 21   End of Session Equipment Utilized During Treatment: Rolling walker (2 wheels);Gait belt;Oxygen Nurse Communication: Mobility status  Activity Tolerance: Patient tolerated treatment well Patient left: in bed;with call bell/phone within reach;with family/visitor present  OT Visit Diagnosis: Other abnormalities of gait and mobility (R26.89)                Time: 1410-1431 OT Time Calculation (min): 21 min Charges:  OT General Charges $OT Visit: 1 Visit OT Evaluation $OT Eval Low Complexity: 1 Low OT Treatments $Therapeutic Activity: 8-22 mins  Kaye Mitro, OTR/L 11/21/23, 4:07 PM  Adelheid Hoggard E Cornisha Zetino 11/21/2023, 4:02 PM

## 2023-11-21 NOTE — Assessment & Plan Note (Signed)
1/2 to 1 pack/day smoker Discussed cessation at length Nicotine patch Monitor

## 2023-11-21 NOTE — Assessment & Plan Note (Signed)
 BP stable Titrate home regimen

## 2023-11-21 NOTE — ED Provider Notes (Signed)
 [facility] Provider Note    Event Date/Time   First MD Initiated Contact with Patient 11/21/23 0720     (approximate)   History   Chief Complaint: Shortness of Breath   HPI  Carly [name] is a 53 y.o. female with a past history of COPD, hypertension who comes the ED complaining of shortness of breath, dyspnea on exertion, wheezing for the past 2 days, not improving with her home bronchodilator use.  No fevers or chills.  Has increased cough but nonproductive.  No chest pain.  No lower extremity swelling or pain.        Past Medical History:  Diagnosis Date   Abnormal uterine bleeding (AUB) 08/26/2018   Anxiety    C. difficile diarrhea    07/04/21   Chicken pox    Depression    Depression, major, single episode, moderate (HCC) 08/04/2018   Diarrhea 06/07/2022   Dyspnea on exertion 05/24/2022   Elevated testosterone  level in female    Emphysema of lung (HCC)    bronchitis   Frequent headaches    Generalized anxiety disorder 08/23/2014   Hay fever    Hypertension    Idiopathic thrombocytopenic purpura (ITP) (HCC)    Iron deficiency 09/02/2017   Positive ANA (antinuclear antibody)    Thrombocytopenia (HCC) 02/12/2016    Current Outpatient Rx   Order #: [id number] Class: Historical Med   Order #: [id number] Class: Normal   Order #: [id number] Class: Normal   Order #: [id number] Class: Historical Med   Order #: [id number] Class: No Print   Order #: [id number] Class: Normal   Order #: [id number] Class: Normal   Order #: [id number] Class: Normal   Order #: [id number] Class: Historical Med   Order #: [id number] Class: No Print   Order #: [id number] Class: Historical Med   Order #: [id number] Class: Normal   Order #: [id number] Class: Normal   Order #: [id number] Class: Normal   Order #: [id number] Class: Normal   Order #: [id number] Class: No Print    Past Surgical History:  Procedure Laterality Date   HEMATOMA EVACUATION N/A 04/07/2020   Procedure: EVACUATION  HEMATOMA  AND APPLICATION OF PRESSURE DRESSING;  Surgeon: Prescilla Brod, MD;  Location: [facility] ORS;  Service: Gynecology;  Laterality: N/A;   HYSTEROSCOPY WITH NOVASURE N/A 02/21/2018   Procedure: HYSTEROSCOPY WITH NOVASURE, POLYPECTOMY;  Surgeon: Prescilla Brod, MD;  Location: [facility] ORS;  Service: Gynecology;  Laterality: N/A;   TUBAL LIGATION  1997   TUBAL LIGATION      Physical Exam   Triage Vital Signs: ED Triage Vitals  Encounter Vitals Group     BP 11/21/23 0531 117/70     Systolic BP Percentile --      Diastolic BP Percentile --      Pulse Rate 11/21/23 0531 (!) 110     Resp 11/21/23 0531 (!) 26     Temp 11/21/23 0531 98.5 F (36.9 C)     Temp Source 11/21/23 0531 Oral     SpO2 11/21/23 0531 90 %     Weight 11/21/23 0532 220 lb (99.8 kg)     Height 11/21/23 0532 5\' 2"  (1.575 m)     Head Circumference --      Peak Flow --      Pain Score --      Pain Loc --      Pain Education --      Exclude from Growth Chart --     Most recent vital signs: Vitals:   11/21/23 0616 11/21/23 0958  BP:  120/70  Pulse: (!) 101 98  Resp: (!) 24 (!) 22  Temp:  98 F (36.7 C)  SpO2: 96% 95%    General: Awake, no distress.  CV:  Good peripheral perfusion.  Regular rate and rhythm Resp:  Normal effort.  No focal crackles.  Diffuse expiratory wheezing and prolonged expiratory phase Abd:  No distention.  Soft nontender Other:  No lower extremity edema.  Symmetric calf circumference, no calf tenderness.   ED Results / Procedures / Treatments   Labs (all labs ordered are listed, but only abnormal results are displayed) Labs Reviewed  CBC - Abnormal; Notable for the following components:      Result Value   WBC 11.7 (*)    RDW 16.3 (*)    Platelets 145 (*)    All other components within normal limits  BASIC METABOLIC PANEL WITH GFR - Abnormal; Notable for the following components:   Potassium 3.4 (*)    Glucose, Bld 163 (*)    Calcium 8.4 (*)    All other components within  normal limits  TROPONIN I (HIGH SENSITIVITY) - Abnormal; Notable for the following components:   Troponin I (High Sensitivity) 20 (*)    All other components within normal limits  RESP PANEL BY RT-PCR (RSV, FLU A&B, COVID)  [id number]  HIV ANTIBODY (ROUTINE TESTING W REFLEX)     EKG Interpreted by me Sinus tachycardia rate 108.  Normal axis and intervals.  Poor R wave progression.  Normal ST segments and T waves.  No evidence of right heart strain.  Normal amplitudes, no electrical alternans.   RADIOLOGY Chest x-ray interpreted by me, unremarkable.  Radiology report reviewed   PROCEDURES:  Procedures   MEDICATIONS ORDERED IN ED: Medications  magnesium  sulfate IVPB 2 g 50 mL (2 g Intravenous New Bag/Given 11/21/23 1008)  enoxaparin  (LOVENOX ) injection 40 mg (has no administration in time range)  acetaminophen  (TYLENOL ) tablet 650 mg (has no administration in time range)  predniSONE  (DELTASONE ) tablet 60 mg (60 mg Oral Given 11/21/23 0748)  ipratropium-albuterol  (DUONEB) 0.5-2.5 (3) MG/3ML nebulizer solution 9 mL (9 mLs Nebulization Given 11/21/23 0748)  albuterol  (PROVENTIL ) (2.5 MG/3ML) 0.083% nebulizer solution 5 mg (5 mg Nebulization Given 11/21/23 1008)  sodium chloride  0.9 % bolus 1,000 mL (1,000 mLs Intravenous New Bag/Given 11/21/23 1007)  levofloxacin  (LEVAQUIN ) tablet 750 mg (750 mg Oral Given 11/21/23 1008)     IMPRESSION / MDM / ASSESSMENT AND PLAN / ED COURSE  I reviewed the triage vital signs and the nursing notes.  DDx: COPD exacerbation, pneumonia, pleural effusion, COVID, influenza, NSTEMI  Patient's presentation is most consistent with acute presentation with potential threat to life or bodily function.  Patient presents with shortness of breath, exam consistent with COPD exacerbation.  EKG chest x-ray and labs are reassuring, with 2 days of symptoms, high-sensitivity troponin that is at chronic baseline sufficiently rules out AMI, symptoms not consistent with unstable  angina.  Doubt dissection pericardial effusion or PE.   Clinical Course as of 11/21/23 1022  Thu Nov 21, 2023  0936 Still with persistent wheezing, pronounced DOE, SO2 90% on RA. Will need to admit for COPD exac [PS]    Clinical Course User Index [PS] Jacquie Maudlin, MD   ----------------------------------------- 10:22 AM on 11/21/2023 ----------------------------------------- Case d/w hospitalist   FINAL CLINICAL IMPRESSION(S) / ED DIAGNOSES   Final diagnoses:  COPD exacerbation (HCC)     Rx / DC Orders   ED Discharge Orders     None  Note:  This document was prepared using Dragon voice recognition software and may include unintentional dictation errors.   Jacquie Maudlin, MD 11/21/23 1023

## 2023-11-21 NOTE — Progress Notes (Signed)
 PHARMACIST - PHYSICIAN COMMUNICATION  CONCERNING:  Enoxaparin  (Lovenox ) for DVT Prophylaxis    RECOMMENDATION: Patient was prescribed enoxaprin 40mg  q24 hours for VTE prophylaxis.   Filed Weights   11/21/23 0532  Weight: 99.8 kg (220 lb)    Body mass index is 40.24 kg/m.  Estimated Creatinine Clearance: 90.9 mL/min (by C-G formula based on SCr of 0.48 mg/dL).   Based on [facility] policy patient is candidate for enoxaparin  0.5mg /kg TBW SQ every 24 hours based on BMI being >30.   DESCRIPTION: Pharmacy has adjusted enoxaparin  dose per [facility] policy.  Patient is now receiving enoxaparin  50 mg every 24 hours    Ramonita Burow, PharmD Clinical Pharmacist  11/21/2023 10:24 AM

## 2023-11-21 NOTE — Evaluation (Signed)
 Physical Therapy Evaluation Patient Details Name: Carly [name] MRN: [id number] DOB: 09-Oct-1970 Today's Date: 11/21/2023  History of Present Illness  Pt is a 53 y.o. female presented with acute respiratory failure with hypoxia, COPD exacerbation. She reports ~2 days of increased work of breathing, cough and wheezing. Admitted for management of acute respiratory failure with hypoxia, COPD exacerbation. PMH of obesity, depression, COPD, ITP, tobacco abuse.  Clinical Impression  Pt pleasant and eager to work with PT.  She was on room air on arrival and had SpO2 97%, pt stayed in the mid 90s t/o the entire ambulation effort of ~200 ft.  She had subjective fatigue and some DOE, but ultimately looked relatively good.  She does not typically need an AD, was eager to see how she'd do w/o... slow, guarded but safe effort.  She indicates that her cadence, speed, etc are far from her baseline, occasional reaching out to stabilize on wall but no LOBs or overt balance issues.  Pt is far from her ambulation tolerance, cadence, etc baseline, but likely will not need much PT once medically ready for d/c.  Will maintain on caseload while here to facilitate return to baseline status and safe d/c.       If plan is discharge home, recommend the following: Assist for transportation;Assistance with cooking/housework   Can travel by private vehicle        Equipment Recommendations None recommended by PT  Recommendations for Other Services       Functional Status Assessment Patient has had a recent decline in their functional status and demonstrates the ability to make significant improvements in function in a reasonable and predictable amount of time.     Precautions / Restrictions Precautions Precautions: Fall Recall of Precautions/Restrictions: Intact Restrictions Weight Bearing Restrictions Per Provider Order: No      Mobility  Bed Mobility Overal bed mobility: Modified Independent                   Transfers Overall transfer level: Modified independent Equipment used: None Transfers: Sit to/from Stand Sit to Stand: Supervision                Ambulation/Gait Ambulation/Gait assistance: Supervision Gait Distance (Feet): 200 Feet Assistive device: None         General Gait Details: on room air, SpO2 remained in the mid 90s, HR up to ~110.  Pt with guarded, slower than baseline, gait but did not have any LOBs or overt safety issues.  She did not report excessive fatigue but clearly was becoming more tired with mild DOE on return to room.  Stairs            Wheelchair Mobility     Tilt Bed    Modified Rankin (Stroke Patients Only)       Balance Overall balance assessment: Needs assistance Sitting-balance support: Feet supported Sitting balance-Leahy Scale: Normal     Standing balance support: No upper extremity supported Standing balance-Leahy Scale: Fair Standing balance comment: occasional reaching out to wall to stabilize, but was not reliant on UE support and did not have any staggering/LOBs/etc                             Pertinent Vitals/Pain Pain Assessment Pain Assessment: No/denies pain Pain Location: they gave me something for my headache    Home Living Family/patient expects to be discharged to:: Private residence Living Arrangements: Spouse/significant other Available Help at Discharge:  Family;Available PRN/intermittently Type of Home: House Home Access: Level entry       Home Layout: One level Home Equipment: Shower seat;Cane - single point Additional Comments: sometimes sponge bathe and sometimes stands in shower    Prior Function Prior Level of Function : Independent/Modified Independent;Driving;Working/employed             Mobility Comments: IND; 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     Extremity/Trunk Assessment   Upper Extremity Assessment Upper Extremity Assessment:  Overall WFL for tasks assessed;Generalized weakness    Lower Extremity Assessment Lower Extremity Assessment: Overall WFL for tasks assessed;Generalized weakness       Communication   Communication Communication: No apparent difficulties    Cognition Arousal: Alert Behavior During Therapy: WFL for tasks assessed/performed   PT - Cognitive impairments: No apparent impairments                         Following commands: Intact       Cueing Cueing Techniques: Verbal cues     General Comments General comments (skin integrity, edema, etc.): on room air, SpO2 remained in mid 90s with 200 ft ambulation    Exercises     Assessment/Plan    PT Assessment Patient needs continued PT services  PT Problem List Decreased activity tolerance;Cardiopulmonary status limiting activity       PT Treatment Interventions Gait training;Functional mobility training;Therapeutic activities;Therapeutic exercise;Balance training;Patient/family education    PT Goals (Current goals can be found in the Care Plan section)  Acute Rehab PT Goals Patient Stated Goal: go home PT Goal Formulation: With patient/family Time For Goal Achievement: 12/04/23 Potential to Achieve Goals: Good    Frequency Min 1X/week     Co-evaluation               AM-PAC PT "6 Clicks" Mobility  Outcome Measure Help needed turning from your back to your side while in a flat bed without using bedrails?: None Help needed moving from lying on your back to sitting on the side of a flat bed without using bedrails?: None Help needed moving to and from a bed to a chair (including a wheelchair)?: None Help needed standing up from a chair using your arms (e.g., wheelchair or bedside chair)?: None Help needed to walk in hospital room?: None Help needed climbing 3-5 steps with a railing? : None 6 Click Score: 24    End of Session Equipment Utilized During Treatment: Gait belt Activity Tolerance: Patient  tolerated treatment well;Patient limited by fatigue Patient left: in bed;with call bell/phone within reach;with family/visitor present   PT Visit Diagnosis: Unsteadiness on feet (R26.81);Difficulty in walking, not elsewhere classified (R26.2)    Time: 1610-9604 PT Time Calculation (min) (ACUTE ONLY): 13 min   Charges:   PT Evaluation $PT Eval Low Complexity: 1 Low   PT General Charges $$ ACUTE PT VISIT: 1 Visit         Darice Edelman, DPT 11/21/2023, 6:17 PM

## 2023-11-22 DIAGNOSIS — J441 Chronic obstructive pulmonary disease with (acute) exacerbation: Secondary | ICD-10-CM | POA: Diagnosis not present

## 2023-11-22 MED ORDER — AMLODIPINE BESYLATE 10 MG PO TABS
10.0000 mg | ORAL_TABLET | Freq: Every day | ORAL | Status: DC
  Administered 2023-11-22 – 2023-11-24 (×3): 10 mg via ORAL
  Filled 2023-11-22 (×3): qty 1

## 2023-11-22 MED ORDER — ESCITALOPRAM OXALATE 10 MG PO TABS
20.0000 mg | ORAL_TABLET | Freq: Every day | ORAL | Status: DC
  Administered 2023-11-22 – 2023-11-24 (×3): 20 mg via ORAL
  Filled 2023-11-22 (×3): qty 2

## 2023-11-22 MED ORDER — ARIPIPRAZOLE 2 MG PO TABS
2.0000 mg | ORAL_TABLET | Freq: Two times a day (BID) | ORAL | Status: DC
  Administered 2023-11-22 – 2023-11-24 (×5): 2 mg via ORAL
  Filled 2023-11-22 (×5): qty 1

## 2023-11-22 MED ORDER — POTASSIUM CHLORIDE CRYS ER 20 MEQ PO TBCR
40.0000 meq | EXTENDED_RELEASE_TABLET | Freq: Once | ORAL | Status: AC
  Administered 2023-11-22: 40 meq via ORAL
  Filled 2023-11-22: qty 2

## 2023-11-22 MED ORDER — ADULT MULTIVITAMIN W/MINERALS CH: 1.0000 | ORAL_TABLET | Freq: Every day | ORAL | Status: DC

## 2023-11-22 MED ORDER — TRAZODONE HCL 50 MG PO TABS
50.0000 mg | ORAL_TABLET | Freq: Every evening | ORAL | Status: DC | PRN
  Administered 2023-11-23: 50 mg via ORAL
  Filled 2023-11-22: qty 1

## 2023-11-22 MED ORDER — ADULT MULTIVITAMIN W/MINERALS CH
1.0000 | ORAL_TABLET | Freq: Every day | ORAL | Status: DC
  Administered 2023-11-22 – 2023-11-24 (×3): 1 via ORAL
  Filled 2023-11-22 (×3): qty 1

## 2023-11-22 MED ORDER — VARENICLINE TARTRATE 0.5 MG PO TABS
1.0000 mg | ORAL_TABLET | Freq: Two times a day (BID) | ORAL | Status: DC
  Administered 2023-11-22 – 2023-11-24 (×5): 1 mg via ORAL
  Filled 2023-11-22 (×5): qty 2

## 2023-11-22 NOTE — Progress Notes (Signed)
 Initial Nutrition Assessment  DOCUMENTATION CODES:   Morbid obesity  INTERVENTION:   -Liberalize diet to regular for widest variety of meal selections -MVI with minerals daily  NUTRITION DIAGNOSIS:   Increased nutrient needs related to chronic illness (COPD) as evidenced by estimated needs.  GOAL:   Patient will meet greater than or equal to 90% of their needs  MONITOR:   PO intake, Supplement acceptance  REASON FOR ASSESSMENT:   Consult Assessment of nutrition requirement/status  ASSESSMENT:   Pt with medical history significant of obesity, depression, COPD, ITP presented with acute respiratory failure with hypoxia, COPD exacerbation.  Patient reports approximate 2 days of increased work of breathing cough and wheezing PTA  Pt admitted with COPD exacerbation.   Reviewed I/O's: +574 ml x 24 hours  Per H&P, pt smokes 0.5-1 pack per day.   Spoke with pt and husband at bedside. Pt reports feeling better today,m but expressed concern over multiple medical issues, especially repeated visits for COPD. Pt "grazes" at home and admits to often eating fast food. 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   Reviewed wt hx; pt has experienced a 1.8% wt loss over the past 4 months, which is not significant for time frame. Pt reports no weight loss and estimates she has gained about 30# over the past year. Pt admits that she feels tired and has not been following a healthy lifestyle. Pt husband at bedside expresses concern over a general decline in health over the past 6 months; pt more tired and feels less motivated overall. Pt admits that she feels down and depressed at time, but has been seeing a psychiatrist who has recently adjusted her medications, which pt has found helpful.   Discussed importance of good meal intake to promote healing. RD provided active listening and encouraged pt to follow a more healthful lifestyle.   Medications reviewed and  include lovenox , lexapro , prednisone , and chantix .   Labs reviewed: K: 3.4.    NUTRITION - FOCUSED PHYSICAL EXAM:  Flowsheet Row Most Recent Value  Orbital Region No depletion  Upper Arm Region No depletion  Thoracic and Lumbar Region No depletion  Buccal Region No depletion  Temple Region No depletion  Clavicle Bone Region No depletion  Clavicle and Acromion Bone Region No depletion  Scapular Bone Region No depletion  Dorsal Hand No depletion  Patellar Region No depletion  Anterior Thigh Region No depletion  Posterior Calf Region No depletion  Edema (RD Assessment) None  Hair Reviewed  Eyes Reviewed  Mouth Reviewed  Skin Reviewed  Nails Reviewed       Diet Order:   Diet Order             Diet regular Room service appropriate? Yes; Fluid consistency: Thin  Diet effective now                   EDUCATION NEEDS:   Education needs have been addressed  Skin:  Skin Assessment: Reviewed RN Assessment  Last BM:  11/21/23  Height:   Ht Readings from Last 1 Encounters:  11/21/23 5\' 2"  (1.575 m)    Weight:   Wt Readings from Last 1 Encounters:  11/21/23 99.8 kg    Ideal Body Weight:  50 kg  BMI:  Body mass index is 40.24 kg/m.  Estimated Nutritional Needs:   Kcal:  1750-1950  Protein:  100-115 grams  Fluid:  1.7-1.9 L    Herschel Lords, RD, LDN,  CDCES Registered Dietitian III Certified Diabetes Care and Education Specialist If unable to reach this RD, please use "RD Inpatient" group chat on secure chat between hours of 8am-4 pm daily

## 2023-11-22 NOTE — Plan of Care (Signed)

## 2023-11-22 NOTE — Progress Notes (Signed)
 Mobility Specialist - Progress Note  Pre-mobility: HR 114, SpO2 99% Post-mobility: HR 106, SPO2 96%   11/22/23 1204  Mobility  Activity Ambulated with assistance in hallway  Level of Assistance Standby assist, set-up cues, supervision of patient - no hands on  Assistive Device None  Distance Ambulated (ft) 160 ft  Activity Response Tolerated well  Mobility visit 1 Mobility  Mobility Specialist Start Time (ACUTE ONLY) 0955  Mobility Specialist Stop Time (ACUTE ONLY) 1007  Mobility Specialist Time Calculation (min) (ACUTE ONLY) 12 min   Pt standing near the bathroom door upon entry, utilizing RA. Pt agreeable to OOB amb this date. Pt amb one lap around the NS with supervision, tolerated well-- SOB noted, O2 >90%, consistent PBL during amb. Pt returned to the room, left seated in the recliner with needs within reach. RN notified.  Versa Gore Mobility Specialist 11/22/23 12:12 PM

## 2023-11-22 NOTE — Progress Notes (Addendum)
 Progress Note    Carly [name]  WUJ:[id number] DOB: 1970/11/17  DOA: 11/21/2023 PCP: Jacklin Mascot, MD      Brief Narrative:    Medical records reviewed and are as summarized below:  Carly [name] is a 53 y.o. female with medical history significant for obesity, depression, COPD, ITP, who presented to the hospital with 2 days of cough, shortness of breath that is worse with exertion and chest tightness.  Symptoms had worsened despite using her inhaler/nebulizer at home.  She was admitted to the hospital for COPD exacerbation.    Assessment/Plan:   Principal Problem:   COPD with acute exacerbation (HCC) Active Problems:   Essential hypertension   Obesity, Class III, BMI 40-49.9 (morbid obesity)   Thrombocytopenia (HCC)   Gastroesophageal reflux disease without esophagitis   Hyperlipidemia   Tobacco abuse   Body mass index is 40.24 kg/m.  (Morbid obesity)    COPD exacerbation: Continue IV steroids, bronchodilators and IV doxycycline . No documented low oxygen saturation.  She is tolerating room air duration feels short of breath with exertion.   Tobacco use disorder: Counseled to quit.  She says she has been taking Chantix  at home but she ran out a few weeks ago.  She wants to continue with Chantix  at discharge.   Hypokalemia: Replete potassium and monitor level   Anxiety, depression, bipolar disorder: Continue lamotrigine , Lexapro  and Abilify    ITP: Platelet count stable at 145.   Comorbidities include hypertension, hyperlipidemia, morbid obesity  Diet Order             Diet regular Room service appropriate? Yes; Fluid consistency: Thin  Diet effective now                            Consultants: None  Procedures: None    Medications:    amLODipine   10 mg Oral Daily   ARIPiprazole   2 mg Oral BID   enoxaparin  (LOVENOX ) injection  50 mg Subcutaneous Q24H   escitalopram   20 mg Oral Daily   ipratropium-albuterol   3 mL  Nebulization Q6H   lamoTRIgine   50 mg Oral Daily   metoprolol  succinate  100 mg Oral Daily   multivitamin with minerals  1 tablet Oral Daily   nicotine   14 mg Transdermal Daily   pantoprazole   40 mg Oral Daily   [START ON 11/23/2023] predniSONE   40 mg Oral Q breakfast   varenicline   1 mg Oral BID   Continuous Infusions:  doxycycline  (VIBRAMYCIN ) IV 100 mg (11/22/23 1037)     Anti-infectives (From admission, onward)    Start     Dose/Rate Route Frequency Ordered Stop   11/21/23 2200  doxycycline  (VIBRAMYCIN ) 100 mg in sodium chloride  0.9 % 250 mL IVPB        100 mg 125 mL/hr over 120 Minutes Intravenous Every 12 hours 11/21/23 1109     11/21/23 0945  levofloxacin  (LEVAQUIN ) tablet 750 mg        750 mg Oral  Once 11/21/23 9562 11/21/23 1008              Family Communication/Anticipated D/C date and plan/Code Status   DVT prophylaxis:      Code Status: Full Code  Family Communication: None Disposition Plan: Plan to discharge home   Status is: Observation The patient will require care spanning > 2 midnights and should be moved to inpatient because: COPD exacerbation       Subjective:  Interval events noted.  She complains of shortness of breath with exertion and productive cough.  Objective:    Vitals:   11/21/23 2005 11/22/23 0130 11/22/23 0329 11/22/23 0832  BP: (!) 144/71  136/74 (!) 152/83  Pulse: 98  89 85  Resp: 17  17 16   Temp: 98.2 F (36.8 C)  98.2 F (36.8 C) 97.7 F (36.5 C)  TempSrc:    Oral  SpO2: 95% 94% 93%   Weight:      Height:       No data found.   Intake/Output Summary (Last 24 hours) at 11/22/2023 1408 Last data filed at 11/22/2023 0333 Gross per 24 hour  Intake 574.21 ml  Output --  Net 574.21 ml   Filed Weights   11/21/23 0532  Weight: 99.8 kg    Exam:  GEN: NAD SKIN: Warm and dry EYES: No pallor or icterus ENT: MMM CV: RRR PULM: Decreased air entry bilaterally.  Bilateral expiratory wheezing ABD: soft, ND,  NT, +BS CNS: AAO x 3, non focal EXT: No edema or tenderness        Data Reviewed:   I have personally reviewed following labs and imaging studies:  Labs: Labs show the following:   Basic Metabolic Panel: Recent Labs  Lab 11/21/23 0538  NA 141  K 3.4*  CL 101  CO2 26  GLUCOSE 163*  BUN 6  CREATININE 0.48  CALCIUM 8.4*   GFR Estimated Creatinine Clearance: 90.9 mL/min (by C-G formula based on SCr of 0.48 mg/dL). Liver Function Tests: No results for input(s): "AST", "ALT", "ALKPHOS", "BILITOT", "PROT", "ALBUMIN" in the last 168 hours. No results for input(s): "LIPASE", "AMYLASE" in the last 168 hours. No results for input(s): "AMMONIA" in the last 168 hours. Coagulation profile No results for input(s): "INR", "PROTIME" in the last 168 hours.  CBC: Recent Labs  Lab 11/21/23 0538  WBC 11.7*  HGB 13.4  HCT 41.5  MCV 99.3  PLT 145*   Cardiac Enzymes: No results for input(s): "CKTOTAL", "CKMB", "CKMBINDEX", "TROPONINI" in the last 168 hours. BNP (last 3 results) No results for input(s): "PROBNP" in the last 8760 hours. CBG: No results for input(s): "GLUCAP" in the last 168 hours. D-Dimer: No results for input(s): "DDIMER" in the last 72 hours. Hgb A1c: No results for input(s): "HGBA1C" in the last 72 hours. Lipid Profile: No results for input(s): "CHOL", "HDL", "LDLCALC", "TRIG", "CHOLHDL", "LDLDIRECT" in the last 72 hours. Thyroid  function studies: No results for input(s): "TSH", "T4TOTAL", "T3FREE", "THYROIDAB" in the last 72 hours.  Invalid input(s): "FREET3" Anemia work up: No results for input(s): "VITAMINB12", "FOLATE", "FERRITIN", "TIBC", "IRON", "RETICCTPCT" in the last 72 hours. Sepsis Labs: Recent Labs  Lab 11/21/23 0538  WBC 11.7*    Microbiology Recent Results (from the past 240 hours)  Resp panel by RT-PCR (RSV, Flu A&B, Covid) Anterior Nasal Swab     Status: None   Collection Time: 11/21/23  5:41 AM   Specimen: Anterior Nasal Swab   Result Value Ref Range Status   SARS Coronavirus 2 by RT PCR NEGATIVE NEGATIVE Final    Comment: (NOTE) SARS-CoV-2 target nucleic acids are NOT DETECTED.  The SARS-CoV-2 RNA is generally detectable in upper respiratory specimens during the acute phase of infection. The lowest concentration of SARS-CoV-2 viral copies this assay can detect is 138 copies/mL. A negative result does not preclude SARS-Cov-2 infection and should not be used as the sole basis for treatment or other patient management decisions. A negative result may  occur with  improper specimen collection/handling, submission of specimen other than nasopharyngeal swab, presence of viral mutation(s) within the areas targeted by this assay, and inadequate number of viral copies(<138 copies/mL). A negative result must be combined with clinical observations, patient history, and epidemiological information. The expected result is Negative.  Fact Sheet for Patients:  BloggerCourse.com  Fact Sheet for Healthcare Providers:  SeriousBroker.it  This test is no t yet approved or cleared by the [place]  FDA and  has been authorized for detection and/or diagnosis of SARS-CoV-2 by FDA under an Emergency Use Authorization (EUA). This EUA will remain  in effect (meaning this test can be used) for the duration of the COVID-19 declaration under Section 564(b)(1) of the Act, 21 U.S.C.section 360bbb-3(b)(1), unless the authorization is terminated  or revoked sooner.       Influenza A by PCR NEGATIVE NEGATIVE Final   Influenza B by PCR NEGATIVE NEGATIVE Final    Comment: (NOTE) The Xpert Xpress SARS-CoV-2/FLU/RSV plus assay is intended as an aid in the diagnosis of influenza from Nasopharyngeal swab specimens and should not be used as a sole basis for treatment. Nasal washings and aspirates are unacceptable for Xpert Xpress SARS-CoV-2/FLU/RSV testing.  Fact Sheet for  Patients: BloggerCourse.com  Fact Sheet for Healthcare Providers: SeriousBroker.it  This test is not yet approved or cleared by the [place]  FDA and has been authorized for detection and/or diagnosis of SARS-CoV-2 by FDA under an Emergency Use Authorization (EUA). This EUA will remain in effect (meaning this test can be used) for the duration of the COVID-19 declaration under Section 564(b)(1) of the Act, 21 U.S.C. section 360bbb-3(b)(1), unless the authorization is terminated or revoked.     Resp Syncytial Virus by PCR NEGATIVE NEGATIVE Final    Comment: (NOTE) Fact Sheet for Patients: BloggerCourse.com  Fact Sheet for Healthcare Providers: SeriousBroker.it  This test is not yet approved or cleared by the [place]  FDA and has been authorized for detection and/or diagnosis of SARS-CoV-2 by FDA under an Emergency Use Authorization (EUA). This EUA will remain in effect (meaning this test can be used) for the duration of the COVID-19 declaration under Section 564(b)(1) of the Act, 21 U.S.C. section 360bbb-3(b)(1), unless the authorization is terminated or revoked.  Performed at [facility], [address] [place], [place] [address]     Procedures and diagnostic studies:  DG Chest 1 View Result Date: 11/21/2023 CLINICAL DATA:  Shortness of breath for 2 days. Chest tightness. COPD. EXAM: CHEST  1 VIEW COMPARISON:  08/02/2023 FINDINGS: The heart size and mediastinal contours are within normal limits. Both lungs are clear. The visualized skeletal structures are unremarkable. IMPRESSION: No active disease. Electronically Signed   By: Marlyce Sine M.D.   On: 11/21/2023 06:00               LOS: 0 days   Shreyan Hinz  [facility]   Pager on [email or web address]. If 7PM-7AM, please contact night-coverage at [email or web address]     11/22/2023, 2:08 PM

## 2023-11-22 NOTE — Progress Notes (Signed)
 Attempted to introduce patient to role of nurse navigator. Patient currently receiving medications/assistance from bedside nurse. Will re-attempt.

## 2023-11-22 NOTE — TOC Progression Note (Signed)
 Transition of Care [facility]) - Progression Note    Patient Details  Name: Carly [name] MRN: [id number] Date of Birth: 04/18/1971  [facility]) CM/SW Contact  Elsie Halo, RN Phone Number: 11/22/2023, 12:52 PM  Clinical Narrative:    Patient lives independently at home with her husband. The patient has a PCP, Dr. Georgeanne King, and uses [facility]. The patient doesn't verbalize having difficulty driving to appointments or obtaining medications.  No TOC needs, please outreach to [facility] if discharge planning needs are identified.        Expected Discharge Plan and Services                                               Social Determinants of Health (SDOH) Interventions SDOH Screenings   Food Insecurity: No Food Insecurity (11/21/2023)  Housing: Low Risk  (11/21/2023)  Transportation Needs: No Transportation Needs (11/21/2023)  Utilities: Not At Risk (11/21/2023)  Depression (PHQ2-9): Low Risk  (08/20/2023)  Social Connections: Moderately Isolated (08/07/2023)  Tobacco Use: High Risk (11/21/2023)    Readmission Risk Interventions     No data to display

## 2023-11-23 DIAGNOSIS — J441 Chronic obstructive pulmonary disease with (acute) exacerbation: Secondary | ICD-10-CM | POA: Diagnosis not present

## 2023-11-23 LAB — BASIC METABOLIC PANEL WITH GFR
Anion gap: 7 (ref 5–15)
BUN: 8 mg/dL (ref 6–20)
CO2: 27 mmol/L (ref 22–32)
Calcium: 8.1 mg/dL — ABNORMAL LOW (ref 8.9–10.3)
Chloride: 106 mmol/L (ref 98–111)
Creatinine, Ser: 0.5 mg/dL (ref 0.44–1.00)
GFR, Estimated: >60 mL/min (ref >60)
Glucose, Bld: 104 mg/dL — ABNORMAL HIGH (ref 70–99)
Potassium: 4.1 mmol/L (ref 3.5–5.1)
Sodium: 140 mmol/L (ref 135–145)

## 2023-11-23 MED ORDER — IPRATROPIUM-ALBUTEROL 0.5-2.5 (3) MG/3ML IN SOLN
3.0000 mL | RESPIRATORY_TRACT | Status: DC
  Administered 2023-11-23 – 2023-11-24 (×7): 3 mL via RESPIRATORY_TRACT
  Filled 2023-11-23 (×7): qty 3

## 2023-11-23 NOTE — Progress Notes (Signed)
 Occupational Therapy Treatment Patient Details Name: Carly [name] MRN: [id number] DOB: Mar 11, 1971 Today's Date: 11/23/2023   History of present illness Pt is a 53 y.o. female presented with acute respiratory failure with hypoxia, COPD exacerbation. She reports ~2 days of increased work of breathing, cough and wheezing. Admitted for management of acute respiratory failure with hypoxia, COPD exacerbation. PMH of obesity, depression, COPD, ITP, tobacco abuse.   OT comments  Pt seen for OT treatment on this date. Upon arrival to room pt seated on EOB eating breakfast with her husband at bedside, agreeable to tx. Pt progressing from CGA- supervision with mobility, amb with no DME. Total ~176ft, no LOB noted during session. Pt often reaches for the wall during hallway mobility. Pt edu on fall risk prevention and energy conservations techniques to utilizes during ADL/IADL tasks. Often labored breathing noted with mobility, VSS. Pt on RA throughout session, sustaining spo2 levels within 90s at rest and during mobility. Pt retired with all needs in reach resting in bed. Pt making good progress toward goals, will continue to follow POC. Discharge recommendation remains appropriate.        If plan is discharge home, recommend the following:  A little help with bathing/dressing/bathroom;A little help with walking and/or transfers;Assistance with cooking/housework;Assist for transportation;Help with stairs or ramp for entrance   Equipment Recommendations  Other (comment)    Recommendations for Other Services      Precautions / Restrictions Precautions Precautions: Fall Recall of Precautions/Restrictions: Intact Restrictions Weight Bearing Restrictions Per Provider Order: No       Mobility Bed Mobility Overal bed mobility: Modified Independent             General bed mobility comments: Seated on EOB with husband on arrival to room.    Transfers Overall transfer level: Needs  assistance Equipment used: None Transfers: Sit to/from Stand Sit to Stand: Supervision           General transfer comment: Supervision during STS from EOB, no physical assistance required, noted labored breathing Spo2 levels 93% on RA in standing post bed mobility.     Balance Overall balance assessment: Needs assistance Sitting-balance support: Feet supported Sitting balance-Leahy Scale: Normal Sitting balance - Comments: Steady reaching outside BOS   Standing balance support: No upper extremity supported Standing balance-Leahy Scale: Fair                             ADL either performed or assessed with clinical judgement   ADL Overall ADL's : At baseline                                       General ADL Comments: Indep adjusting socks while seated on EOB, indep eating breakfast, declined further ADL completion due to upcoming shower with NT.    Communication Communication Communication: No apparent difficulties   Cognition Arousal: Alert Behavior During Therapy: WFL for tasks assessed/performed Cognition: No apparent impairments             OT - Cognition Comments: Eager to participate in session                 Following commands: Intact        Cueing   Cueing Techniques: Verbal cues  Exercises Exercises: Other exercises Other Exercises Other Exercises: Edu: Energy conservation techniques, pacing self during mobility  Shoulder Instructions       General Comments On RA throughout session, Spo2 levels within the 90s at rest and during mobility.    Pertinent Vitals/ Pain       Pain Assessment Pain Assessment: No/denies pain  Home Living                                          Prior Functioning/Environment              Frequency  Min 1X/week        Progress Toward Goals  OT Goals(current goals can now be found in the care plan section)  Progress towards OT goals: Progressing  toward goals  Acute Rehab OT Goals Patient Stated Goal: Return home OT Goal Formulation: With patient/family Time For Goal Achievement: 12/05/23 Potential to Achieve Goals: Fair ADL Goals Pt Will Perform Lower Body Bathing: with modified independence;sitting/lateral leans;sit to/from stand Pt Will Transfer to Toilet: with modified independence;ambulating;regular height toilet Additional ADL Goal #1: Pt will demo implementation of 1 learned ECS during ADL performance 2/2 trials to prevent overxertion.  Plan      Co-evaluation                 AM-PAC OT "6 Clicks" Daily Activity     Outcome Measure   Help from another person eating meals?: None Help from another person taking care of personal grooming?: None Help from another person toileting, which includes using toliet, bedpan, or urinal?: None Help from another person bathing (including washing, rinsing, drying)?: A Little Help from another person to put on and taking off regular upper body clothing?: None Help from another person to put on and taking off regular lower body clothing?: None 6 Click Score: 23    End of Session Equipment Utilized During Treatment: Gait belt  OT Visit Diagnosis: Other abnormalities of gait and mobility (R26.89)   Activity Tolerance Patient tolerated treatment well   Patient Left in bed;with call bell/phone within reach;with family/visitor present   Nurse Communication Mobility status        Time: 8119-1478 OT Time Calculation (min): 13 min  Charges: OT General Charges $OT Visit: 1 Visit OT Treatments $Therapeutic Activity: 8-22 mins  Rosaria Common M.S. OTR/L  11/23/23, 9:53 AM

## 2023-11-23 NOTE — Plan of Care (Signed)

## 2023-11-23 NOTE — Plan of Care (Signed)
  Problem: Education: Goal: Knowledge of General Education information will improve Description Including pain rating scale, medication(s)/side effects and non-pharmacologic comfort measures Outcome: Progressing   Problem: Health Behavior/Discharge Planning: Goal: Ability to manage health-related needs will improve Outcome: Progressing   Problem: Clinical Measurements: Goal: Ability to maintain clinical measurements within normal limits will improve Outcome: Progressing Goal: Will remain free from infection Outcome: Progressing Goal: Diagnostic test results will improve Outcome: Progressing Goal: Respiratory complications will improve Outcome: Progressing Goal: Cardiovascular complication will be avoided Outcome: Progressing   Problem: Activity: Goal: Risk for activity intolerance will decrease Outcome: Progressing   Problem: Elimination: Goal: Will not experience complications related to bowel motility Outcome: Progressing Goal: Will not experience complications related to urinary retention Outcome: Progressing   Problem: Safety: Goal: Ability to remain free from injury will improve Outcome: Progressing   

## 2023-11-23 NOTE — Progress Notes (Signed)
 Physical Therapy Treatment Patient Details Name: Carly [name] MRN: [id number] DOB: [birth year] Today's Date: 11/23/2023   History of Present Illness Pt is a 53 y.o. female presented with acute respiratory failure with hypoxia, COPD exacerbation. She reports ~2 days of increased work of breathing, cough and wheezing. Admitted for management of acute respiratory failure with hypoxia, COPD exacerbation. PMH of obesity, depression, COPD, ITP, tobacco abuse.  Pt progressing with mobility on room air, SpO2 remained in the mid 90s, HR up to ~103 during gait and standing balance activities. Pt with guarded, slower than baseline, gait but did not have any LOBs or overt safety issues. She did not report excessive fatigue but had mild DOE during mobility. Pt worked on visual scanning during gait with no LOB. Continued PT will assist pt towards greater dynamic standing balance, LE strengthening, and activity tolerance to increase safety and independence with functional mobility.   PT Comments     If plan is discharge home, recommend the following: Assist for transportation;Assistance with cooking/housework   Can travel by private vehicle        Equipment Recommendations  None recommended by PT    Recommendations for Other Services       Precautions / Restrictions Precautions Precautions: Fall Recall of Precautions/Restrictions: Intact Restrictions Weight Bearing Restrictions Per Provider Order: No     Mobility  Bed Mobility Overal bed mobility: Modified Independent                  Transfers Overall transfer level: Modified independent Equipment used: None Transfers: Sit to/from Stand, Bed to chair/wheelchair/BSC Sit to Stand: Modified independent (Device/Increase time)   Step pivot transfers: Modified independent (Device/Increase time)       General transfer comment: noted labored breathing Spo2 levels 93% on RA in standing post bed mobility.     Ambulation/Gait Ambulation/Gait assistance: Supervision Gait Distance (Feet): 160 Feet Assistive device: None Gait Pattern/deviations: Decreased stride length, WFL(Within Functional Limits)       General Gait Details: on room air, SpO2 remained in the mid 90s, HR up to ~110.  Pt with guarded, slower than baseline, gait but did not have any LOBs or overt safety issues.  She did not report excessive fatigue but clearly was becoming more tired with mild DOE on return to room.  Pt worked on visual scanning during gait with no LOB.   Stairs             Wheelchair Mobility     Tilt Bed    Modified Rankin (Stroke Patients Only)       Balance   Sitting-balance support: Feet supported Sitting balance-Leahy Scale: Normal Sitting balance - Comments: Steady reaching outside BOS   Standing balance support: No upper extremity supported Standing balance-Leahy Scale: Fair Standing balance comment: occasional reaching out to wall to stabilize, but was not reliant on UE support and did not have any staggering/LOBs/etc                            Communication Communication Communication: No apparent difficulties  Cognition Arousal: Alert Behavior During Therapy: WFL for tasks assessed/performed   PT - Cognitive impairments: No apparent impairments                         Following commands: Intact      Cueing Cueing Techniques: Verbal cues  Exercises      General Comments General comments (skin  integrity, edema, etc.): On RA throughout session, Spo2 levels within the 90s at rest and during mobility.      Pertinent Vitals/Pain Pain Assessment Pain Assessment: Faces Faces Pain Scale: Hurts a little bit Pain Location: they gave me something for my headache Pain Descriptors / Indicators: Aching, Headache Pain Intervention(s): Monitored during session    Home Living                          Prior Function            PT Goals  (current goals can now be found in the care plan section) Acute Rehab PT Goals Patient Stated Goal: go home PT Goal Formulation: With patient/family Time For Goal Achievement: 12/04/23 Potential to Achieve Goals: Good Progress towards PT goals: Progressing toward goals    Frequency    Min 1X/week      PT Plan      Co-evaluation              AM-PAC PT "6 Clicks" Mobility   Outcome Measure    Help needed moving from lying on your back to sitting on the side of a flat bed without using bedrails?: None Help needed moving to and from a bed to a chair (including a wheelchair)?: None Help needed standing up from a chair using your arms (e.g., wheelchair or bedside chair)?: None Help needed to walk in hospital room?: None Help needed climbing 3-5 steps with a railing? : None 6 Click Score: 20    End of Session   Activity Tolerance: Patient tolerated treatment well;Patient limited by fatigue Patient left: in bed;with call bell/phone within reach;with family/visitor present Nurse Communication: Mobility status PT Visit Diagnosis: Unsteadiness on feet (R26.81);Difficulty in walking, not elsewhere classified (R26.2)     Time: 0945-1000 PT Time Calculation (min) (ACUTE ONLY): 15 min  Charges:    $Therapeutic Activity: 8-22 mins PT General Charges $$ ACUTE PT VISIT: 1 Visit                     Eliazar Gross, PTA  11/23/23, 10:09 AM

## 2023-11-23 NOTE — Progress Notes (Signed)
 Progress Note    Carly [name]  DGU:[id number] DOB: 1970-12-17  DOA: 11/21/2023 PCP: Jacklin Mascot, MD      Brief Narrative:    Medical records reviewed and are as summarized below:  Carly [name] is a 53 y.o. female with medical history significant for obesity, depression, COPD, ITP, who presented to the hospital with 2 days of cough, shortness of breath that is worse with exertion and chest tightness.  Symptoms had worsened despite using her inhaler/nebulizer at home.  She was admitted to the hospital for COPD exacerbation.    Assessment/Plan:   Principal Problem:   COPD with acute exacerbation (HCC) Active Problems:   Essential hypertension   Obesity, Class III, BMI 40-49.9 (morbid obesity)   Thrombocytopenia (HCC)   Gastroesophageal reflux disease without esophagitis   Hyperlipidemia   Tobacco abuse   Body mass index is 40.24 kg/m.  (Morbid obesity)    COPD exacerbation: Continue IV doxycycline , prednisone  and bronchodilators.   No documented low oxygen saturation.  She is tolerating room air duration feels short of breath with exertion.   Tobacco use disorder: Counseled to quit.  She says she has been taking Chantix  at home but she ran out a few weeks ago.  She wants to continue with Chantix  at discharge.   Hypokalemia: Improved   Anxiety, depression, bipolar disorder: Continue lamotrigine , Lexapro  and Abilify    ITP: Platelet count stable at 145.   Comorbidities include hypertension, hyperlipidemia, morbid obesity  Diet Order             Diet regular Room service appropriate? Yes; Fluid consistency: Thin  Diet effective now                            Consultants: None  Procedures: None    Medications:    amLODipine   10 mg Oral Daily   ARIPiprazole   2 mg Oral BID   enoxaparin  (LOVENOX ) injection  50 mg Subcutaneous Q24H   escitalopram   20 mg Oral Daily   ipratropium-albuterol   3 mL Nebulization Q4H   lamoTRIgine   50  mg Oral Daily   metoprolol  succinate  100 mg Oral Daily   multivitamin with minerals  1 tablet Oral Daily   nicotine   14 mg Transdermal Daily   pantoprazole   40 mg Oral Daily   predniSONE   40 mg Oral Q breakfast   varenicline   1 mg Oral BID   Continuous Infusions:  doxycycline  (VIBRAMYCIN ) IV 100 mg (11/23/23 1052)     Anti-infectives (From admission, onward)    Start     Dose/Rate Route Frequency Ordered Stop   11/21/23 2200  doxycycline  (VIBRAMYCIN ) 100 mg in sodium chloride  0.9 % 250 mL IVPB        100 mg 125 mL/hr over 120 Minutes Intravenous Every 12 hours 11/21/23 1109     11/21/23 0945  levofloxacin  (LEVAQUIN ) tablet 750 mg        750 mg Oral  Once 11/21/23 9563 11/21/23 1008              Family Communication/Anticipated D/C date and plan/Code Status   DVT prophylaxis:      Code Status: Full Code  Family Communication: Plan discussed with the husband at the bedside Disposition Plan: Plan to discharge home   Status is: Observation The patient will require care spanning > 2 midnights and should be moved to inpatient because: COPD exacerbation  Subjective:   She complains of shortness of breath with minimal exertion.  She still has a cough that is sometimes productive.  Her husband was at the bedside  Objective:    Vitals:   11/22/23 2025 11/23/23 0128 11/23/23 0312 11/23/23 0809  BP:   123/72 (!) 146/74  Pulse:   88 97  Resp:    20  Temp:   97.9 F (36.6 C) 98 F (36.7 C)  TempSrc:    Oral  SpO2: 93% 92% 94% 96%  Weight:      Height:       No data found.   Intake/Output Summary (Last 24 hours) at 11/23/2023 1246 Last data filed at 11/23/2023 0900 Gross per 24 hour  Intake 360 ml  Output --  Net 360 ml   Filed Weights   11/21/23 0532  Weight: 99.8 kg    Exam:  GEN: NAD SKIN: Warm and dry EYES: No pallor or icterus ENT: MMM CV: RRR PULM: Bilateral diffuse expiratory wheezing ABD: soft, obese, NT, +BS CNS: AAO x 3,  non focal EXT: No edema or tenderness       Data Reviewed:   I have personally reviewed following labs and imaging studies:  Labs: Labs show the following:   Basic Metabolic Panel: Recent Labs  Lab 11/21/23 0538 11/23/23 0410  NA 141 140  K 3.4* 4.1  CL 101 106  CO2 26 27  GLUCOSE 163* 104*  BUN 6 8  CREATININE 0.48 0.50  CALCIUM 8.4* 8.1*   GFR Estimated Creatinine Clearance: 90.9 mL/min (by C-G formula based on SCr of 0.5 mg/dL). Liver Function Tests: No results for input(s): "AST", "ALT", "ALKPHOS", "BILITOT", "PROT", "ALBUMIN" in the last 168 hours. No results for input(s): "LIPASE", "AMYLASE" in the last 168 hours. No results for input(s): "AMMONIA" in the last 168 hours. Coagulation profile No results for input(s): "INR", "PROTIME" in the last 168 hours.  CBC: Recent Labs  Lab 11/21/23 0538  WBC 11.7*  HGB 13.4  HCT 41.5  MCV 99.3  PLT 145*   Cardiac Enzymes: No results for input(s): "CKTOTAL", "CKMB", "CKMBINDEX", "TROPONINI" in the last 168 hours. BNP (last 3 results) No results for input(s): "PROBNP" in the last 8760 hours. CBG: No results for input(s): "GLUCAP" in the last 168 hours. D-Dimer: No results for input(s): "DDIMER" in the last 72 hours. Hgb A1c: No results for input(s): "HGBA1C" in the last 72 hours. Lipid Profile: No results for input(s): "CHOL", "HDL", "LDLCALC", "TRIG", "CHOLHDL", "LDLDIRECT" in the last 72 hours. Thyroid  function studies: No results for input(s): "TSH", "T4TOTAL", "T3FREE", "THYROIDAB" in the last 72 hours.  Invalid input(s): "FREET3" Anemia work up: No results for input(s): "VITAMINB12", "FOLATE", "FERRITIN", "TIBC", "IRON", "RETICCTPCT" in the last 72 hours. Sepsis Labs: Recent Labs  Lab 11/21/23 0538  WBC 11.7*    Microbiology Recent Results (from the past 240 hours)  Resp panel by RT-PCR (RSV, Flu A&B, Covid) Anterior Nasal Swab     Status: None   Collection Time: 11/21/23  5:41 AM   Specimen:  Anterior Nasal Swab  Result Value Ref Range Status   SARS Coronavirus 2 by RT PCR NEGATIVE NEGATIVE Final    Comment: (NOTE) SARS-CoV-2 target nucleic acids are NOT DETECTED.  The SARS-CoV-2 RNA is generally detectable in upper respiratory specimens during the acute phase of infection. The lowest concentration of SARS-CoV-2 viral copies this assay can detect is 138 copies/mL. A negative result does not preclude SARS-Cov-2 infection and should not be used  as the sole basis for treatment or other patient management decisions. A negative result may occur with  improper specimen collection/handling, submission of specimen other than nasopharyngeal swab, presence of viral mutation(s) within the areas targeted by this assay, and inadequate number of viral copies(<138 copies/mL). A negative result must be combined with clinical observations, patient history, and epidemiological information. The expected result is Negative.  Fact Sheet for Patients:  BloggerCourse.com  Fact Sheet for Healthcare Providers:  SeriousBroker.it  This test is no t yet approved or cleared by the [place]  FDA and  has been authorized for detection and/or diagnosis of SARS-CoV-2 by FDA under an Emergency Use Authorization (EUA). This EUA will remain  in effect (meaning this test can be used) for the duration of the COVID-19 declaration under Section 564(b)(1) of the Act, 21 U.S.C.section 360bbb-3(b)(1), unless the authorization is terminated  or revoked sooner.       Influenza A by PCR NEGATIVE NEGATIVE Final   Influenza B by PCR NEGATIVE NEGATIVE Final    Comment: (NOTE) The Xpert Xpress SARS-CoV-2/FLU/RSV plus assay is intended as an aid in the diagnosis of influenza from Nasopharyngeal swab specimens and should not be used as a sole basis for treatment. Nasal washings and aspirates are unacceptable for Xpert Xpress SARS-CoV-2/FLU/RSV testing.  Fact  Sheet for Patients: BloggerCourse.com  Fact Sheet for Healthcare Providers: SeriousBroker.it  This test is not yet approved or cleared by the [place]  FDA and has been authorized for detection and/or diagnosis of SARS-CoV-2 by FDA under an Emergency Use Authorization (EUA). This EUA will remain in effect (meaning this test can be used) for the duration of the COVID-19 declaration under Section 564(b)(1) of the Act, 21 U.S.C. section 360bbb-3(b)(1), unless the authorization is terminated or revoked.     Resp Syncytial Virus by PCR NEGATIVE NEGATIVE Final    Comment: (NOTE) Fact Sheet for Patients: BloggerCourse.com  Fact Sheet for Healthcare Providers: SeriousBroker.it  This test is not yet approved or cleared by the [place]  FDA and has been authorized for detection and/or diagnosis of SARS-CoV-2 by FDA under an Emergency Use Authorization (EUA). This EUA will remain in effect (meaning this test can be used) for the duration of the COVID-19 declaration under Section 564(b)(1) of the Act, 21 U.S.C. section 360bbb-3(b)(1), unless the authorization is terminated or revoked.  Performed at [facility], [address] [place], [place] [address]     Procedures and diagnostic studies:  No results found.              LOS: 0 days   Dinah Lupa  [facility]   Pager on [email or web address]. If 7PM-7AM, please contact night-coverage at [email or web address]     11/23/2023, 12:46 PM

## 2023-11-24 DIAGNOSIS — J441 Chronic obstructive pulmonary disease with (acute) exacerbation: Secondary | ICD-10-CM | POA: Diagnosis not present

## 2023-11-24 MED ORDER — DOXYCYCLINE HYCLATE 100 MG PO TABS
100.0000 mg | ORAL_TABLET | Freq: Two times a day (BID) | ORAL | 0 refills | Status: AC
Start: 2023-11-24 — End: 2023-11-28

## 2023-11-24 MED ORDER — ARIPIPRAZOLE 2 MG PO TABS: 2.0000 mg | ORAL_TABLET | Freq: Two times a day (BID) | ORAL | Status: DC

## 2023-11-24 MED ORDER — BENZONATATE 100 MG PO CAPS: 200.0000 mg | ORAL_CAPSULE | Freq: Three times a day (TID) | ORAL | 0 refills | Status: DC | PRN

## 2023-11-24 MED ORDER — PREDNISONE 20 MG PO TABS: 40.0000 mg | ORAL_TABLET | Freq: Every day | ORAL | 0 refills | Status: AC

## 2023-11-24 MED ORDER — NICOTINE 14 MG/24HR TD PT24: 14.0000 mg | MEDICATED_PATCH | Freq: Every day | TRANSDERMAL | 0 refills | Status: DC

## 2023-11-24 MED ORDER — VARENICLINE TARTRATE 1 MG PO TABS: 1.0000 mg | ORAL_TABLET | Freq: Two times a day (BID) | ORAL | 0 refills | Status: DC

## 2023-11-24 MED ORDER — LAMOTRIGINE 25 MG PO TABS: 75.0000 mg | ORAL_TABLET | Freq: Every day | ORAL | Status: DC

## 2023-11-24 NOTE — Discharge Summary (Signed)
 Physician Discharge Summary   Patient: Carly [name] MRN: [id number] DOB: Sep 29, 1970  Admit date:     11/21/2023  Discharge date: 11/24/23  Discharge Physician: Sheril Dines   PCP: Jacklin Mascot, MD   Recommendations at discharge:   Follow-up with PCP in 1 to 2 weeks  Discharge Diagnoses: Principal Problem:   COPD with acute exacerbation (HCC) Active Problems:   Essential hypertension   Obesity, Class III, BMI 40-49.9 (morbid obesity)   Thrombocytopenia (HCC)   Gastroesophageal reflux disease without esophagitis   Hyperlipidemia   Tobacco abuse  Resolved Problems:   * No resolved hospital problems. *  Hospital Course:  Carly [name] is a 53 y.o. female with medical history significant for obesity, depression, COPD, ITP, who presented to the hospital with 2 days of cough, shortness of breath that is worse with exertion and chest tightness.  Symptoms had worsened despite using her inhaler/nebulizer at home.   She was admitted to the hospital for COPD exacerbation.    Assessment and Plan:  COPD exacerbation: Improved.  She will be discharged on doxycycline  for 4 days and prednisone  for 2 more days.  Continue bronchodilators at home.  She has a nebulizer at home. No documented low oxygen saturation.  She is tolerating room air She is no longer short of breath with exertion.  She has ambulated around the nurses station without any problems   Tobacco use disorder: Counseled to quit.  She says she has been taking Chantix  at home but she ran out a few weeks ago.   She was given refills on Chantix   Hypokalemia: Improved     Anxiety, depression, bipolar disorder: Continue lamotrigine , Lexapro  and Abilify      ITP: Platelet count stable at 145.     Comorbidities include hypertension, hyperlipidemia, morbid obesity   Her condition has improved.  She is feeling better and wants to go home today because it's "Mother's Day" today and today is her husband's birthday.  She is  deemed stable for discharge.  Discharge plan discussed with the patient and her husband at the bedside.        Consultants: None Procedures performed: None Disposition: Home Diet recommendation:  Discharge Diet Orders (From admission, onward)     Start     Ordered   11/24/23 0000  Diet - low sodium heart healthy        11/24/23 1024           Cardiac diet DISCHARGE MEDICATION: Allergies as of 11/24/2023       Reactions   Wellbutrin  [bupropion ]    Crazy   Meloxicam  Nausea Only        Medication List     STOP taking these medications    ondansetron  4 MG disintegrating tablet Commonly known as: ZOFRAN -ODT   oxybutynin  5 MG 24 hr tablet Commonly known as: DITROPAN -XL       TAKE these medications    acetaminophen  500 MG tablet Commonly known as: TYLENOL  Take 500-1,000 mg by mouth every 6 (six) hours as needed for mild pain or headache.   albuterol  (2.5 MG/3ML) 0.083% nebulizer solution Commonly known as: PROVENTIL  Take 3 mLs (2.5 mg total) by nebulization every 6 (six) hours as needed for wheezing or shortness of breath.   albuterol  108 (90 Base) MCG/ACT inhaler Commonly known as: VENTOLIN  HFA INHALE 2 PUFFS INTO THE LUNGS EVERY 6 HOURS AS NEEDED FOR WHEEZING OR SHORTNESS OF BREATH (COUGH)   amLODipine  10 MG tablet Commonly known as: NORVASC  Take  10 mg by mouth daily.   ARIPiprazole  2 MG tablet Commonly known as: Abilify  Take 1 tablet (2 mg total) by mouth 2 (two) times daily.   benzonatate  100 MG capsule Commonly known as: TESSALON  Take 2 capsules (200 mg total) by mouth 3 (three) times daily as needed. What changed: how much to take   Breztri  Aerosphere 160-9-4.8 MCG/ACT Aero inhaler Generic drug: budeson-glycopyrrolate-formoterol  INHALE 2 PUFFS INTO THE LUNGS IN THE MORNING AND AT BEDTIME.   cyanocobalamin  1000 MCG/ML injection Commonly known as: VITAMIN B12 INJECT 1 ML (1,000 MCG TOTAL) INTO THE MUSCLE EVERY 30 DAYS.   D3-50 1.25 MG  (50000 UT) capsule Generic drug: Cholecalciferol  Take 50,000 Units by mouth once a week.   doxycycline  100 MG tablet Commonly known as: VIBRA -TABS Take 1 tablet (100 mg total) by mouth 2 (two) times daily for 4 days.   escitalopram  20 MG tablet Commonly known as: LEXAPRO  Take 1 tablet (20 mg total) by mouth daily.   lamoTRIgine  25 MG tablet Commonly known as: LAMICTAL  Take 3 tablets (75 mg total) by mouth daily. What changed:  how much to take Another medication with the same name was removed. Continue taking this medication, and follow the directions you see here.   metoprolol  succinate 100 MG 24 hr tablet Commonly known as: TOPROL -XL TAKE 1 TABLET BY MOUTH DAILY. TAKE WITH OR IMMEDIATELY FOLLOWING A MEAL.   nicotine  14 mg/24hr patch Commonly known as: NICODERM CQ  - dosed in mg/24 hours Place 1 patch (14 mg total) onto the skin daily. Start taking on: Nov 25, 2023   omeprazole  20 MG capsule Commonly known as: PRILOSEC TAKE 1 CAPSULE BY MOUTH EVERY DAY   predniSONE  20 MG tablet Commonly known as: DELTASONE  Take 2 tablets (40 mg total) by mouth daily with breakfast for 2 days. Start taking on: Nov 25, 2023   traZODone  50 MG tablet Commonly known as: DESYREL  TAKE 0.5-1 TABLETS BY MOUTH AT BEDTIME AS NEEDED FOR SLEEP.   varenicline  1 MG tablet Commonly known as: CHANTIX  Take 1 tablet (1 mg total) by mouth 2 (two) times daily.        Follow-up Information     Bair, Randa Burton, MD Follow up.   Specialty: Family Medicine Why: Hospital follow up Contact information: [address] [place] [address] [phone]                Discharge Exam: Filed Weights   11/21/23 0532  Weight: 99.8 kg   GEN: NAD SKIN: Warm and dry EYES: No pallor or icterus ENT: MMM CV: RRR PULM: Adequate air entry bilaterally.  Occasional mild wheezing.  No rales heard ABD: soft, obese, NT, +BS CNS: AAO x 3, non focal EXT: No edema or tenderness   Condition at  discharge: good  The results of significant diagnostics from this hospitalization (including imaging, microbiology, ancillary and laboratory) are listed below for reference.   Imaging Studies: DG Chest 1 View Result Date: 11/21/2023 CLINICAL DATA:  Shortness of breath for 2 days. Chest tightness. COPD. EXAM: CHEST  1 VIEW COMPARISON:  08/02/2023 FINDINGS: The heart size and mediastinal contours are within normal limits. Both lungs are clear. The visualized skeletal structures are unremarkable. IMPRESSION: No active disease. Electronically Signed   By: Marlyce Sine M.D.   On: 11/21/2023 06:00    Microbiology: Results for orders placed or performed during the hospital encounter of 11/21/23  Resp panel by RT-PCR (RSV, Flu A&B, Covid) Anterior Nasal Swab     Status: None  Collection Time: 11/21/23  5:41 AM   Specimen: Anterior Nasal Swab  Result Value Ref Range Status   SARS Coronavirus 2 by RT PCR NEGATIVE NEGATIVE Final    Comment: (NOTE) SARS-CoV-2 target nucleic acids are NOT DETECTED.  The SARS-CoV-2 RNA is generally detectable in upper respiratory specimens during the acute phase of infection. The lowest concentration of SARS-CoV-2 viral copies this assay can detect is 138 copies/mL. A negative result does not preclude SARS-Cov-2 infection and should not be used as the sole basis for treatment or other patient management decisions. A negative result may occur with  improper specimen collection/handling, submission of specimen other than nasopharyngeal swab, presence of viral mutation(s) within the areas targeted by this assay, and inadequate number of viral copies(<138 copies/mL). A negative result must be combined with clinical observations, patient history, and epidemiological information. The expected result is Negative.  Fact Sheet for Patients:  BloggerCourse.com  Fact Sheet for Healthcare Providers:  SeriousBroker.it  This  test is no t yet approved or cleared by the [place]  FDA and  has been authorized for detection and/or diagnosis of SARS-CoV-2 by FDA under an Emergency Use Authorization (EUA). This EUA will remain  in effect (meaning this test can be used) for the duration of the COVID-19 declaration under Section 564(b)(1) of the Act, 21 U.S.C.section 360bbb-3(b)(1), unless the authorization is terminated  or revoked sooner.       Influenza A by PCR NEGATIVE NEGATIVE Final   Influenza B by PCR NEGATIVE NEGATIVE Final    Comment: (NOTE) The Xpert Xpress SARS-CoV-2/FLU/RSV plus assay is intended as an aid in the diagnosis of influenza from Nasopharyngeal swab specimens and should not be used as a sole basis for treatment. Nasal washings and aspirates are unacceptable for Xpert Xpress SARS-CoV-2/FLU/RSV testing.  Fact Sheet for Patients: BloggerCourse.com  Fact Sheet for Healthcare Providers: SeriousBroker.it  This test is not yet approved or cleared by the [place]  FDA and has been authorized for detection and/or diagnosis of SARS-CoV-2 by FDA under an Emergency Use Authorization (EUA). This EUA will remain in effect (meaning this test can be used) for the duration of the COVID-19 declaration under Section 564(b)(1) of the Act, 21 U.S.C. section 360bbb-3(b)(1), unless the authorization is terminated or revoked.     Resp Syncytial Virus by PCR NEGATIVE NEGATIVE Final    Comment: (NOTE) Fact Sheet for Patients: BloggerCourse.com  Fact Sheet for Healthcare Providers: SeriousBroker.it  This test is not yet approved or cleared by the [place]  FDA and has been authorized for detection and/or diagnosis of SARS-CoV-2 by FDA under an Emergency Use Authorization (EUA). This EUA will remain in effect (meaning this test can be used) for the duration of the COVID-19 declaration under  Section 564(b)(1) of the Act, 21 U.S.C. section 360bbb-3(b)(1), unless the authorization is terminated or revoked.  Performed at [facility], [address] [place], [place] [address]     Labs: CBC: Recent Labs  Lab 11/21/23 0538  WBC 11.7*  HGB 13.4  HCT 41.5  MCV 99.3  PLT 145*   Basic Metabolic Panel: Recent Labs  Lab 11/21/23 0538 11/23/23 0410  NA 141 140  K 3.4* 4.1  CL 101 106  CO2 26 27  GLUCOSE 163* 104*  BUN 6 8  CREATININE 0.48 0.50  CALCIUM 8.4* 8.1*   Liver Function Tests: No results for input(s): "AST", "ALT", "ALKPHOS", "BILITOT", "PROT", "ALBUMIN" in the last 168 hours. CBG: No results for input(s): "GLUCAP" in the last  168 hours.  Discharge time spent: greater than 30 minutes.  Signed: Sheril Dines, MD [facility] 11/24/2023

## 2023-11-24 NOTE — Progress Notes (Signed)
 Patient is alert and oriented x 4. Discharge instruction given to patient. No any questions at this time.peripheral I/v removed.

## 2023-11-24 NOTE — Plan of Care (Signed)

## 2023-11-28 ENCOUNTER — Telehealth: Payer: Self-pay

## 2023-11-28 ENCOUNTER — Other Ambulatory Visit: Payer: Self-pay

## 2023-11-28 DIAGNOSIS — Z87891 Personal history of nicotine dependence: Secondary | ICD-10-CM

## 2023-11-28 DIAGNOSIS — Z122 Encounter for screening for malignant neoplasm of respiratory organs: Secondary | ICD-10-CM

## 2023-11-28 NOTE — Telephone Encounter (Signed)
 Lung Cancer Screening Narrative/Criteria Questionnaire (Cigarette Smokers Only- No Cigars/Pipes/vapes)   Carly [name]   SDMV:01/07/2023 at 4:00 pm with Isa Manuel        04/17/1971   LDCT: 01/09/2024 at [facility]    53 y.o.   Phone: [phone]  Lung Screening Narrative (confirm age 41-77 yrs Medicare / 50-80 yrs Private pay insurance)   Insurance information: BCBS   Referring Provider: Viva Grise   This screening involves an initial phone call with a team member from our program. It is called a shared decision making visit. The initial meeting is required by  insurance and Medicare to make sure you understand the program. This appointment takes about 15-20 minutes to complete. You will complete the screening scan at your scheduled date/time.  This scan takes about 5-10 minutes to complete. You can eat and drink normally before and after the scan.  Criteria questions for Lung Cancer Screening:   Are you a current or former smoker? Former Age began smoking: 17   If you are a former smoker, what year did you quit smoking? Quit 11/20/2023 (within 15 yrs)   To calculate your smoking history, I need an accurate estimate of how many packs of cigarettes you smoked per day and for how many years. (Not just the number of PPD you are now smoking)   Years smoking 36 x Packs per day 2 = Pack years 72   (at least 20 pack yrs)   (Make sure they understand that we need to know how much they have smoked in the past, not just the number of PPD they are smoking now)  Do you have a personal history of cancer?  No    Do you have a family history of cancer? Yes  (cancer type and and relative) Aunt Ovarian Cancer  Are you coughing up blood?  No  Have you had unexplained weight loss of 15 lbs or more in the last 6 months? No  It looks like you meet all criteria.  When would be a good time for us  to schedule you for this screening?   Additional information: N/A

## 2023-12-01 ENCOUNTER — Other Ambulatory Visit: Payer: Self-pay | Admitting: Family Medicine

## 2023-12-01 DIAGNOSIS — I10 Essential (primary) hypertension: Secondary | ICD-10-CM

## 2023-12-13 ENCOUNTER — Ambulatory Visit: Admitting: Pulmonary Disease

## 2023-12-17 ENCOUNTER — Other Ambulatory Visit: Payer: Self-pay | Admitting: *Deleted

## 2023-12-17 DIAGNOSIS — D693 Immune thrombocytopenic purpura: Secondary | ICD-10-CM

## 2023-12-18 ENCOUNTER — Inpatient Hospital Stay

## 2023-12-18 ENCOUNTER — Telehealth: Payer: Self-pay | Admitting: Oncology

## 2023-12-18 NOTE — Telephone Encounter (Signed)
 Patient called to reschedule today's appointment due to being out of town. She rescheduled to 6/18.

## 2024-01-01 ENCOUNTER — Inpatient Hospital Stay: Attending: Oncology

## 2024-01-01 ENCOUNTER — Inpatient Hospital Stay

## 2024-01-07 ENCOUNTER — Encounter: Admitting: Acute Care

## 2024-01-07 ENCOUNTER — Encounter: Admitting: *Deleted

## 2024-01-07 NOTE — Progress Notes (Signed)
 Attempted to reach pt x3.  Left message on machine to call office to rescheduled SDMV prior to CT scan

## 2024-01-08 ENCOUNTER — Inpatient Hospital Stay: Attending: Oncology

## 2024-01-08 ENCOUNTER — Inpatient Hospital Stay

## 2024-01-09 ENCOUNTER — Ambulatory Visit: Admission: RE | Admit: 2024-01-09 | Source: Ambulatory Visit

## 2024-01-10 ENCOUNTER — Other Ambulatory Visit: Payer: Self-pay

## 2024-01-10 ENCOUNTER — Emergency Department (EMERGENCY_DEPARTMENT_HOSPITAL)
Admission: EM | Admit: 2024-01-10 | Discharge: 2024-01-11 | Disposition: A | Discharge status: Other Acute Inpatient Hospital | Source: Home / Self Care | Attending: Emergency Medicine | Admitting: Emergency Medicine

## 2024-01-10 DIAGNOSIS — F32A Depression, unspecified: Secondary | ICD-10-CM

## 2024-01-10 DIAGNOSIS — F1010 Alcohol abuse, uncomplicated: Secondary | ICD-10-CM | POA: Insufficient documentation

## 2024-01-10 DIAGNOSIS — R45851 Suicidal ideations: Secondary | ICD-10-CM | POA: Insufficient documentation

## 2024-01-10 DIAGNOSIS — I10 Essential (primary) hypertension: Secondary | ICD-10-CM | POA: Insufficient documentation

## 2024-01-10 DIAGNOSIS — F331 Major depressive disorder, recurrent, moderate: Secondary | ICD-10-CM | POA: Insufficient documentation

## 2024-01-10 DIAGNOSIS — F1024 Alcohol dependence with alcohol-induced mood disorder: Secondary | ICD-10-CM | POA: Diagnosis not present

## 2024-01-10 DIAGNOSIS — F3160 Bipolar disorder, current episode mixed, unspecified: Secondary | ICD-10-CM

## 2024-01-10 DIAGNOSIS — Y908 Blood alcohol level of 240 mg/100 ml or more: Secondary | ICD-10-CM | POA: Insufficient documentation

## 2024-01-10 LAB — COMPREHENSIVE METABOLIC PANEL WITH GFR
ALT: 45 U/L — ABNORMAL HIGH (ref 0–44)
AST: 164 U/L — ABNORMAL HIGH (ref 15–41)
Albumin: 3.4 g/dL — ABNORMAL LOW (ref 3.5–5.0)
Alkaline Phosphatase: 264 U/L — ABNORMAL HIGH (ref 38–126)
Anion gap: 13 (ref 5–15)
BUN: 7 mg/dL (ref 6–20)
CO2: 27 mmol/L (ref 22–32)
Calcium: 8.9 mg/dL (ref 8.9–10.3)
Chloride: 97 mmol/L — ABNORMAL LOW (ref 98–111)
Creatinine, Ser: 0.57 mg/dL (ref 0.44–1.00)
GFR, Estimated: >60 mL/min (ref >60)
Glucose, Bld: 140 mg/dL — ABNORMAL HIGH (ref 70–99)
Potassium: 4.1 mmol/L (ref 3.5–5.1)
Sodium: 137 mmol/L (ref 135–145)
Total Bilirubin: 0.9 mg/dL (ref 0.0–1.2)
Total Protein: 6.8 g/dL (ref 6.5–8.1)

## 2024-01-10 LAB — URINE DRUG SCREEN, QUALITATIVE (ARMC ONLY)
Amphetamines, Ur Screen: NOT DETECTED
Barbiturates, Ur Screen: NOT DETECTED
Benzodiazepine, Ur Scrn: NOT DETECTED
Cannabinoid 50 Ng, Ur ~~LOC~~: NOT DETECTED
Cocaine Metabolite,Ur ~~LOC~~: NOT DETECTED
MDMA (Ecstasy)Ur Screen: NOT DETECTED
Methadone Scn, Ur: NOT DETECTED
Opiate, Ur Screen: NOT DETECTED
Phencyclidine (PCP) Ur S: NOT DETECTED
Tricyclic, Ur Screen: NOT DETECTED

## 2024-01-10 LAB — CBC
HCT: 41.8 % (ref 36.0–46.0)
Hemoglobin: 13.7 g/dL (ref 12.0–15.0)
MCH: 29.3 pg (ref 26.0–34.0)
MCHC: 32.8 g/dL (ref 30.0–36.0)
MCV: 89.3 fL (ref 80.0–100.0)
Platelets: 51 10*3/uL — ABNORMAL LOW (ref 150–400)
RBC: 4.68 MIL/uL (ref 3.87–5.11)
RDW: 16.6 % — ABNORMAL HIGH (ref 11.5–15.5)
WBC: 9.7 10*3/uL (ref 4.0–10.5)
nRBC: 0 % (ref 0.0–0.2)

## 2024-01-10 LAB — ETHANOL: Alcohol, Ethyl (B): 397 mg/dL (ref <15)

## 2024-01-10 MED ORDER — LORAZEPAM 1 MG PO TABS
1.0000 mg | ORAL_TABLET | Freq: Once | ORAL | Status: AC
  Administered 2024-01-10: 1 mg via ORAL
  Filled 2024-01-10: qty 1

## 2024-01-10 NOTE — BH Assessment (Signed)
 Comprehensive Clinical Assessment (CCA) Screening, Triage and Referral Note  01/10/2024 Carly [name] [id number] Recommendations for Services/Supports/Treatments: Pending Disposition/Iris Consult.  Carly [name] is a 53 year old, Caucasian female. Pt presented to [facility] ED voluntarily. Per triage note: Patient C/O suicidal ideation that began today. Patient states that she has been drunk for weeks and that today she lost it. She sites stress at home like her husband losing his job that is contributing. Patient states she has been dealing with ETOH dependence for awhile. She states that she drinks about half a pint of liquor a day. Her last drink was early this morning. She does not have a plan, but states that she doesn't want to live. No past suicidal attempts. Patient does have previous psych history and BP issues.  On assessment, pt. was expansive about her current stressors. Pt identified coming to terms with her husband's dementia dx, family conflict, and discovering that her husband's company is going out of business in two months as her main stressors. Pt stated, "I lost it". Pt reported that she drank 3 12 oz beers prior to her arrival. Pt reported that she began having passive SI and hallucinations of people talking/animal running across the floor. Pt reported that she has not been eating or sleeping. Pt reported that she's been in a manic episode, has been suffering from panic attacks, and walks the floor for an hour at a time. Pt had good insight; explaining that she is overwhelmed and has been abusing alcohol this past week. Pt endorsed having symptoms of depression and anxiety, due to her adult son constantly asking for money which causes her and her husband to be at odds. Pt was oriented x4. Pt's thoughts were relevant and intact. Pt had an anxious mood; congruent affect. Pt denied current SI/HI/AV/H. Pt had a BAC of 397. UDS unremarkable.  Chief Complaint:  Chief Complaint  Patient  presents with   Suicidal   Visit Diagnosis: MDD recurrent, moderate  Patient Reported Information How did you hear about us ? No data recorded What Is the Reason for Your Visit/Call Today? No data recorded How Long Has This Been Causing You Problems? No data recorded What Do You Feel Would Help You the Most Today? No data recorded  Have You Recently Had Any Thoughts About Hurting Yourself? No data recorded Are You Planning to Commit Suicide/Harm Yourself At This time? No data recorded  Have you Recently Had Thoughts About Hurting Someone Sherral? No data recorded Are You Planning to Harm Someone at This Time? No data recorded Explanation: No data recorded  Have You Used Any Alcohol or Drugs in the Past 24 Hours? No data recorded How Long Ago Did You Use Drugs or Alcohol? No data recorded What Did You Use and How Much? No data recorded  Do You Currently Have a Therapist/Psychiatrist? No data recorded Name of Therapist/Psychiatrist: No data recorded  Have You Been Recently Discharged From Any Office Practice or Programs? No data recorded Explanation of Discharge From Practice/Program: No data recorded   CCA Screening Triage Referral Assessment Type of Contact: No data recorded Telemedicine Service Delivery:   Is this Initial or Reassessment?   Date Telepsych consult ordered in [facility]:    Time Telepsych consult ordered in [facility]:    Location of Assessment: No data recorded Provider Location: No data recorded   Collateral Involvement: No data recorded  Does Patient Have a Court Appointed Legal Guardian? No data recorded Name and Contact of Legal Guardian: No data recorded If  Minor and Not Living with Parent(s), Who has Custody? No data recorded Is CPS involved or ever been involved? No data recorded Is APS involved or ever been involved? No data recorded  Patient Determined To Be At Risk for Harm To Self or Others Based on Review of Patient Reported Information or Presenting Complaint?  No data recorded Method: No data recorded Availability of Means: No data recorded Intent: No data recorded Notification Required: No data recorded Additional Information for Danger to Others Potential: No data recorded Additional Comments for Danger to Others Potential: No data recorded Are There Guns or Other Weapons in Your Home? No data recorded Types of Guns/Weapons: No data recorded Are These Weapons Safely Secured?                            No data recorded Who Could Verify You Are Able To Have These Secured: No data recorded Do You Have any Outstanding Charges, Pending Court Dates, Parole/Probation? No data recorded Contacted To Inform of Risk of Harm To Self or Others: No data recorded  Does Patient Present under Involuntary Commitment? No data recorded   [place] of Residence: No data recorded  Patient Currently Receiving the Following Services: No data recorded  Determination of Need: No data recorded  Options For Referral: No data recorded  Disposition Recommendation per psychiatric provider: Pending  Carly [name] R Geneviene Tesch, LCAS

## 2024-01-10 NOTE — ED Triage Notes (Signed)
 Patient C/O suicidal ideation that began today. Patient states that she has been drunk for weeks and that today she lost it. She sites stress at home like her husband losing his job that is contributing. Patient states she has been dealing with ETOH dependence for awhile. She states that she drinks about half a pint of liquor a day. Her last drink was early this morning. She does not have a plan, but states that she doesn't want to live. No past suicidal attempts. Patient does have previous psych history and BP issues.

## 2024-01-10 NOTE — ED Notes (Signed)
Fall bundle in place

## 2024-01-10 NOTE — ED Provider Notes (Signed)
 [facility] Provider Note    Event Date/Time   First MD Initiated Contact with Patient 01/10/24 2008     (approximate)   History   Suicidal   HPI  Carly [name] is a 53 y.o. female who appears to the emergency department today after losing it.  Patient does admit to abusing alcohol over the past week to try to mask how she has been feeling.  Today it was too much.  She did drink alcohol today.  Patient states that she is on medications for bipolar.  She is managed by psychiatrist.  Has an appointment scheduled in a few weeks.  She did not try reaching out to them over the past week.     Physical Exam   Triage Vital Signs: ED Triage Vitals [01/10/24 1954]  Encounter Vitals Group     BP 138/83     Girls Systolic BP Percentile      Girls Diastolic BP Percentile      Boys Systolic BP Percentile      Boys Diastolic BP Percentile      Pulse Rate 97     Resp 18     Temp 98.6 F (37 C)     Temp Source Oral     SpO2 98 %     Weight 220 lb (99.8 kg)     Height 5' 3 (1.6 m)     Head Circumference      Peak Flow      Pain Score 0     Pain Loc      Pain Education      Exclude from Growth Chart     Most recent vital signs: Vitals:   01/10/24 1954  BP: 138/83  Pulse: 97  Resp: 18  Temp: 98.6 F (37 C)  SpO2: 98%   General: Awake, alert, oriented. CV:  Good peripheral perfusion. Regular rate and rhythm. Resp:  Normal effort. Lungs clear. Abd:  No distention.   ED Results / Procedures / Treatments   Labs (all labs ordered are listed, but only abnormal results are displayed) Labs Reviewed  COMPREHENSIVE METABOLIC PANEL WITH GFR - Abnormal; Notable for the following components:      Result Value   Chloride 97 (*)    Glucose, Bld 140 (*)    Albumin 3.4 (*)    AST 164 (*)    ALT 45 (*)    Alkaline Phosphatase 264 (*)    All other components within normal limits  ETHANOL - Abnormal; Notable for the following components:   Alcohol,  Ethyl (B) 397 (*)    All other components within normal limits  CBC - Abnormal; Notable for the following components:   RDW 16.6 (*)    Platelets 51 (*)    All other components within normal limits  URINE DRUG SCREEN, QUALITATIVE ([facility] ONLY)     EKG  None   RADIOLOGY None   PROCEDURES:  Critical Care performed: No    MEDICATIONS ORDERED IN ED: Medications - No data to display   IMPRESSION / MDM / ASSESSMENT AND PLAN / ED COURSE  I reviewed the triage vital signs and the nursing notes.                              Differential diagnosis includes, but is not limited to, depression, drug induced mood disorder  Patient's presentation is most consistent with acute presentation with potential  threat to life or bodily function.  Patient presents to the emergency department today with complaints of suicidal thoughts and losing it in the setting of alcohol use.  On exam patient does appear depressed.  Blood work confirms alcohol use.  Will have psychiatry evaluate.  The patient has been placed in psychiatric observation due to the need to provide a safe environment for the patient while obtaining psychiatric consultation and evaluation, as well as ongoing medical and medication management to treat the patient's condition.  The patient has not been placed under full IVC at this time.       FINAL CLINICAL IMPRESSION(S) / ED DIAGNOSES   Final diagnoses:  Depression, unspecified depression type  Alcohol abuse     Note:  This document was prepared using Dragon voice recognition software and may include unintentional dictation errors.    Floy Roberts, MD 01/10/24 2101

## 2024-01-11 ENCOUNTER — Encounter: Payer: Self-pay | Admitting: Psychiatry

## 2024-01-11 ENCOUNTER — Encounter: Payer: Self-pay | Admitting: Adult Health

## 2024-01-11 ENCOUNTER — Inpatient Hospital Stay
Admission: AD | Admit: 2024-01-11 | Discharge: 2024-01-15 | DRG: 897 | Disposition: A | Discharge status: Home / Self Care | Source: Intra-hospital | Attending: Psychiatry | Admitting: Psychiatry

## 2024-01-11 DIAGNOSIS — F1094 Alcohol use, unspecified with alcohol-induced mood disorder: Secondary | ICD-10-CM | POA: Diagnosis not present

## 2024-01-11 DIAGNOSIS — R748 Abnormal levels of other serum enzymes: Secondary | ICD-10-CM | POA: Diagnosis present

## 2024-01-11 DIAGNOSIS — F17210 Nicotine dependence, cigarettes, uncomplicated: Secondary | ICD-10-CM | POA: Diagnosis present

## 2024-01-11 DIAGNOSIS — F1024 Alcohol dependence with alcohol-induced mood disorder: Principal | ICD-10-CM | POA: Diagnosis present

## 2024-01-11 DIAGNOSIS — Z83438 Family history of other disorder of lipoprotein metabolism and other lipidemia: Secondary | ICD-10-CM

## 2024-01-11 DIAGNOSIS — F410 Panic disorder [episodic paroxysmal anxiety] without agoraphobia: Secondary | ICD-10-CM | POA: Diagnosis present

## 2024-01-11 DIAGNOSIS — F1090 Alcohol use, unspecified, uncomplicated: Secondary | ICD-10-CM | POA: Diagnosis not present

## 2024-01-11 DIAGNOSIS — F411 Generalized anxiety disorder: Secondary | ICD-10-CM | POA: Diagnosis present

## 2024-01-11 DIAGNOSIS — Z818 Family history of other mental and behavioral disorders: Secondary | ICD-10-CM | POA: Diagnosis not present

## 2024-01-11 DIAGNOSIS — Z6838 Body mass index (BMI) 38.0-38.9, adult: Secondary | ICD-10-CM

## 2024-01-11 DIAGNOSIS — Z833 Family history of diabetes mellitus: Secondary | ICD-10-CM | POA: Diagnosis not present

## 2024-01-11 DIAGNOSIS — Z8249 Family history of ischemic heart disease and other diseases of the circulatory system: Secondary | ICD-10-CM | POA: Diagnosis not present

## 2024-01-11 DIAGNOSIS — J439 Emphysema, unspecified: Secondary | ICD-10-CM | POA: Diagnosis present

## 2024-01-11 DIAGNOSIS — E669 Obesity, unspecified: Secondary | ICD-10-CM | POA: Diagnosis present

## 2024-01-11 DIAGNOSIS — F10229 Alcohol dependence with intoxication, unspecified: Secondary | ICD-10-CM | POA: Diagnosis present

## 2024-01-11 DIAGNOSIS — F3181 Bipolar II disorder: Secondary | ICD-10-CM | POA: Diagnosis present

## 2024-01-11 DIAGNOSIS — K219 Gastro-esophageal reflux disease without esophagitis: Secondary | ICD-10-CM

## 2024-01-11 DIAGNOSIS — F3160 Bipolar disorder, current episode mixed, unspecified: Secondary | ICD-10-CM

## 2024-01-11 DIAGNOSIS — I10 Essential (primary) hypertension: Secondary | ICD-10-CM | POA: Diagnosis present

## 2024-01-11 DIAGNOSIS — F17290 Nicotine dependence, other tobacco product, uncomplicated: Secondary | ICD-10-CM | POA: Diagnosis present

## 2024-01-11 DIAGNOSIS — Z79899 Other long term (current) drug therapy: Secondary | ICD-10-CM

## 2024-01-11 DIAGNOSIS — Y908 Blood alcohol level of 240 mg/100 ml or more: Secondary | ICD-10-CM | POA: Diagnosis present

## 2024-01-11 DIAGNOSIS — F1010 Alcohol abuse, uncomplicated: Secondary | ICD-10-CM

## 2024-01-11 DIAGNOSIS — Z823 Family history of stroke: Secondary | ICD-10-CM | POA: Diagnosis not present

## 2024-01-11 HISTORY — DX: Alcohol abuse, uncomplicated: F10.10

## 2024-01-11 HISTORY — DX: Alcohol use, unspecified with alcohol-induced mood disorder: F10.94

## 2024-01-11 MED ORDER — OLANZAPINE 10 MG IM SOLR: 5.0000 mg | Freq: Three times a day (TID) | INTRAMUSCULAR | Status: DC | PRN

## 2024-01-11 MED ORDER — THIAMINE HCL 100 MG/ML IJ SOLN: 100.0000 mg | Freq: Every day | INTRAMUSCULAR | Status: DC

## 2024-01-11 MED ORDER — LAMOTRIGINE 100 MG PO TABS
100.0000 mg | ORAL_TABLET | Freq: Every day | ORAL | Status: DC
Start: 2024-01-12 — End: 2024-01-12
  Administered 2024-01-12: 100 mg via ORAL
  Filled 2024-01-11: qty 1

## 2024-01-11 MED ORDER — FOLIC ACID 1 MG PO TABS
1.0000 mg | ORAL_TABLET | Freq: Every day | ORAL | Status: DC
  Administered 2024-01-11: 1 mg via ORAL
  Filled 2024-01-11: qty 1

## 2024-01-11 MED ORDER — OLANZAPINE 10 MG IM SOLR: 10.0000 mg | Freq: Three times a day (TID) | INTRAMUSCULAR | Status: DC | PRN

## 2024-01-11 MED ORDER — NICOTINE 7 MG/24HR TD PT24
7.0000 mg | MEDICATED_PATCH | Freq: Once | TRANSDERMAL | Status: DC
  Administered 2024-01-11: 7 mg via TRANSDERMAL
  Filled 2024-01-11: qty 1

## 2024-01-11 MED ORDER — ADULT MULTIVITAMIN W/MINERALS CH
1.0000 | ORAL_TABLET | Freq: Every day | ORAL | Status: DC
  Administered 2024-01-11: 1 via ORAL
  Filled 2024-01-11: qty 1

## 2024-01-11 MED ORDER — ARIPIPRAZOLE 10 MG PO TABS
5.0000 mg | ORAL_TABLET | Freq: Every day | ORAL | Status: DC
  Administered 2024-01-11: 5 mg via ORAL
  Filled 2024-01-11: qty 1

## 2024-01-11 MED ORDER — ESCITALOPRAM OXALATE 10 MG PO TABS
20.0000 mg | ORAL_TABLET | Freq: Every day | ORAL | Status: DC
Start: 2024-01-11 — End: 2024-01-11

## 2024-01-11 MED ORDER — IBUPROFEN 200 MG PO TABS
400.0000 mg | ORAL_TABLET | Freq: Three times a day (TID) | ORAL | Status: DC | PRN
  Administered 2024-01-12: 400 mg via ORAL
  Filled 2024-01-11: qty 2

## 2024-01-11 MED ORDER — NICOTINE POLACRILEX 2 MG MT GUM
2.0000 mg | CHEWING_GUM | OROMUCOSAL | Status: DC | PRN
  Administered 2024-01-11: 2 mg via ORAL
  Filled 2024-01-11 (×2): qty 1

## 2024-01-11 MED ORDER — THIAMINE MONONITRATE 100 MG PO TABS
100.0000 mg | ORAL_TABLET | Freq: Every day | ORAL | Status: DC
  Administered 2024-01-11: 100 mg via ORAL
  Filled 2024-01-11: qty 1

## 2024-01-11 MED ORDER — ALUM & MAG HYDROXIDE-SIMETH 200-200-20 MG/5ML PO SUSP: 30.0000 mL | ORAL | Status: DC | PRN

## 2024-01-11 MED ORDER — TRAZODONE HCL 50 MG PO TABS
50.0000 mg | ORAL_TABLET | Freq: Every evening | ORAL | Status: DC | PRN
  Administered 2024-01-11: 50 mg via ORAL
  Filled 2024-01-11: qty 1

## 2024-01-11 MED ORDER — OLANZAPINE 5 MG PO TBDP: 5.0000 mg | ORAL_TABLET | Freq: Three times a day (TID) | ORAL | Status: DC | PRN

## 2024-01-11 MED ORDER — THIAMINE MONONITRATE 100 MG PO TABS
100.0000 mg | ORAL_TABLET | Freq: Every day | ORAL | Status: DC
  Administered 2024-01-12 – 2024-01-15 (×4): 100 mg via ORAL
  Filled 2024-01-11 (×4): qty 1

## 2024-01-11 MED ORDER — LORAZEPAM 1 MG PO TABS
1.0000 mg | ORAL_TABLET | ORAL | Status: AC | PRN
  Administered 2024-01-11: 1 mg via ORAL
  Administered 2024-01-11: 2 mg via ORAL
  Filled 2024-01-11: qty 2
  Filled 2024-01-11: qty 1

## 2024-01-11 MED ORDER — ALBUTEROL SULFATE (2.5 MG/3ML) 0.083% IN NEBU: 2.5000 mg | INHALATION_SOLUTION | Freq: Four times a day (QID) | RESPIRATORY_TRACT | Status: DC | PRN

## 2024-01-11 MED ORDER — LAMOTRIGINE 100 MG PO TABS
100.0000 mg | ORAL_TABLET | Freq: Every day | ORAL | Status: DC
  Administered 2024-01-11: 100 mg via ORAL
  Filled 2024-01-11: qty 1

## 2024-01-11 MED ORDER — METOPROLOL SUCCINATE ER 50 MG PO TB24
100.0000 mg | ORAL_TABLET | Freq: Every day | ORAL | Status: DC
  Administered 2024-01-11: 100 mg via ORAL
  Filled 2024-01-11: qty 2

## 2024-01-11 MED ORDER — LORAZEPAM 1 MG PO TABS
1.0000 mg | ORAL_TABLET | ORAL | Status: DC | PRN
  Administered 2024-01-11 (×2): 1 mg via ORAL
  Filled 2024-01-11 (×2): qty 1

## 2024-01-11 MED ORDER — FOLIC ACID 1 MG PO TABS
1.0000 mg | ORAL_TABLET | Freq: Every day | ORAL | Status: DC
  Administered 2024-01-12 – 2024-01-15 (×4): 1 mg via ORAL
  Filled 2024-01-11 (×4): qty 1

## 2024-01-11 MED ORDER — ARIPIPRAZOLE 5 MG PO TABS
5.0000 mg | ORAL_TABLET | Freq: Every day | ORAL | Status: DC
  Administered 2024-01-12 – 2024-01-15 (×4): 5 mg via ORAL
  Filled 2024-01-11 (×4): qty 1

## 2024-01-11 MED ORDER — ONDANSETRON 4 MG PO TBDP
4.0000 mg | ORAL_TABLET | Freq: Once | ORAL | Status: AC
  Administered 2024-01-11: 4 mg via ORAL
  Filled 2024-01-11: qty 1

## 2024-01-11 MED ORDER — NICOTINE 21 MG/24HR TD PT24
21.0000 mg | MEDICATED_PATCH | Freq: Every day | TRANSDERMAL | Status: DC
  Administered 2024-01-11 – 2024-01-15 (×5): 21 mg via TRANSDERMAL
  Filled 2024-01-11 (×5): qty 1

## 2024-01-11 MED ORDER — PANTOPRAZOLE SODIUM 40 MG PO TBEC
40.0000 mg | DELAYED_RELEASE_TABLET | Freq: Every day | ORAL | Status: DC
  Administered 2024-01-12 – 2024-01-15 (×4): 40 mg via ORAL
  Filled 2024-01-11 (×4): qty 1

## 2024-01-11 MED ORDER — AMLODIPINE BESYLATE 5 MG PO TABS: 10.0000 mg | ORAL_TABLET | Freq: Every day | ORAL | Status: DC

## 2024-01-11 MED ORDER — ALBUTEROL SULFATE (2.5 MG/3ML) 0.083% IN NEBU
2.5000 mg | INHALATION_SOLUTION | Freq: Four times a day (QID) | RESPIRATORY_TRACT | Status: DC | PRN
  Administered 2024-01-12 – 2024-01-14 (×8): 2.5 mg via RESPIRATORY_TRACT
  Filled 2024-01-11: qty 3
  Filled 2024-01-11: qty 12
  Filled 2024-01-11 (×5): qty 3
  Filled 2024-01-11: qty 12
  Filled 2024-01-11 (×3): qty 3
  Filled 2024-01-11 (×2): qty 12

## 2024-01-11 MED ORDER — ADULT MULTIVITAMIN W/MINERALS CH
1.0000 | ORAL_TABLET | Freq: Every day | ORAL | Status: DC
  Administered 2024-01-12 – 2024-01-15 (×4): 1 via ORAL
  Filled 2024-01-11 (×4): qty 1

## 2024-01-11 MED ORDER — METOPROLOL SUCCINATE ER 25 MG PO TB24
100.0000 mg | ORAL_TABLET | Freq: Every day | ORAL | Status: DC
  Administered 2024-01-12 – 2024-01-15 (×4): 100 mg via ORAL
  Filled 2024-01-11 (×4): qty 4

## 2024-01-11 MED ORDER — PANTOPRAZOLE SODIUM 40 MG PO TBEC
40.0000 mg | DELAYED_RELEASE_TABLET | Freq: Every day | ORAL | Status: DC
  Administered 2024-01-11: 40 mg via ORAL
  Filled 2024-01-11: qty 1

## 2024-01-11 NOTE — Tx Team (Signed)
 Initial Treatment Plan 01/11/2024 1:58 PM Emmaleah KALAYSIA DEMONBREUN FMW:[id number]    PATIENT STRESSORS: Financial difficulties   Health problems     PATIENT STRENGTHS: Average or above average intelligence  Capable of independent living  Financial means  General fund of knowledge  Supportive family/friends  Work skills    PATIENT IDENTIFIED PROBLEMS:   Husband newly diagnosed with dementia   Husband is losing his job                  DISCHARGE CRITERIA:  Improved stabilization in mood, thinking, and/or behavior Verbal commitment to aftercare and medication compliance Withdrawal symptoms are absent or subacute and managed without 24-hour nursing intervention  PRELIMINARY DISCHARGE PLAN: Attend 12-step recovery group Outpatient therapy Return to previous living arrangement Return to previous work or school arrangements  PATIENT/FAMILY INVOLVEMENT: This treatment plan has been presented to and reviewed with the patient, LOYS HOSELTON.  The patient has been given the opportunity to ask questions and make suggestions.  Leita MARLA Rosella, RN 01/11/2024, 1:58 PM

## 2024-01-11 NOTE — Progress Notes (Signed)
 Patient arrived voluntary from [facility] ED.  Patient is pleasant and cooperative. She reports her anxiety and depression have improved since receiving PRN Ativan  in the ED. She denies SI/HI/AVH and endorses she never had a plan or intent. Patient states she would have thoughts about how it would feel to go to sleep and not wake up. Patient reports she has been under increase stress at home due to her husband being recently diagnosed with dementia and him losing his job. 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 She states her husband and kids are her support system and that she has support for her husband as well.   Patient states her strengths are: hardworker, listen to others and go getter.   Her goals for treatment are to feel better.   Patient states she does not think she needs rehab at this time. She states she is not going to drink anymore when she leaves because this situation scared me. Alcohol use education provided to the patient.   Patient and belongings searched; no contraband found. All forms signed and all questions answered. Patient denies any needs. Patient oriented to unit. Will continue to monitor.    01/11/24 1348  Psych Admission Type (Psych Patients Only)  Admission Status Voluntary  Psychosocial Assessment  Patient Complaints Anxiety;Depression (Patient rates her anxiety and depression is a 2/10. She reports feeling better.)  Eye Contact Fair  Facial Expression Sad  Affect Sad  Speech Logical/coherent;Soft  Interaction Cautious  Motor Activity Slow;Tremors  Appearance/Hygiene Unremarkable  Behavior Characteristics Cooperative  Mood Apprehensive;Sad;Pleasant  Thought Process  Coherency WDL  Content WDL  Delusions None reported or observed  Perception WDL  Hallucination None reported or observed  Judgment Poor  Confusion None  Danger to Self  Current suicidal ideation? Denies  Danger to Others  Danger to Others None reported or observed

## 2024-01-11 NOTE — Consult Note (Signed)
 [facility] Consult Note  Patient Name: Carly [name] MRN: [id number] DOB: 1971/01/27 DATE OF Consult: 01/11/2024  PRIMARY PSYCHIATRIC DIAGNOSES  1.  Alcohol Use Disorder 2.  Bipolar D/O mixed    RECOMMENDATIONS  Inpt psych admission recommended:    [x] YES       []  NO   If yes:       []   Pt meets involuntary commitment criteria if not voluntary       [x]    Pt does not meet involuntary commitment criteria and must be         voluntary. If patient is not voluntary, then discharge is recommended. She declined offer   Medication recommendations:  recommend increase aripiprazole  5mg  po daily for mood; continue with home medication escitalopram  20mg  po daily; lamotrigine  100mg  po daily   Discussed naltrexone but informed her LFTs need to show trending back to normal levels before can trial.   Please ensure K> 4, Mg> 2 and Qtc < 500 when using antipsychotics. Monitor for extrapyramidal syndrome (EPS) such as dystonia, akathisia, and tardive dyskinesia  Non-Medication recommendations:  referral to IOP for SUD; dual dx psychotherapist   Offered detox/rehab, she declined discussed dangers of how high her ETOH level was, risk of withdrawal from alcohol and benefits of detox, she continue to decline  This patient is at chronic risk of accidental death if she continues to abuse alcohol despite appropriate intensive interventions.  She was encouraged to keep her scheduled appointments, take her medications as directed and maintain sobriety.    Communication: Treatment team members (and family members if applicable) who were involved in treatment/care discussions and planning, and with whom we spoke or engaged with via secure text/chat, include the following: Epic Chat Dr Floy, Rolin LEANDER   I have discussed my assessment and treatment recommendations with the patient. Possible medication side effects/risks/benefits of current regimen.   Importance of medication adherence for medication  to be beneficial.   Follow-Up Telepsychiatry C/L services:            []  We will continue to follow this patient with you.             [x]  Will sign off for now. Please re-consult our service as necessary.  Thank you for involving us  in the care of this patient. If you have any additional questions or concerns, please call [phone] and ask for me or the provider on-call.  TELEPSYCHIATRY ATTESTATION & CONSENT  As the provider for this telehealth consult, I attest that I verified the patient's identity using two separate identifiers, introduced myself to the patient, provided my credentials, disclosed my location, and performed this encounter via a HIPAA-compliant, real-time, face-to-face, two-way, interactive audio and video platform and with the full consent and agreement of the patient (or guardian as applicable.)  Patient physical location: [facility] ED. Telehealth provider physical location: home office in state of [place]  Video start time: 05:27am  (Central Time) Video end time: 05:48am  (Central Time)  IDENTIFYING DATA  MIYANA [name] is a 53 y.o. year-old female for whom a psychiatric consultation has been ordered by the primary provider. The patient was identified using two separate identifiers.  CHIEF COMPLAINT/REASON FOR CONSULT  I was drinking real heavy, I stayed that way for a week, I started 2 days ago, my husband tried to help me detox and my mood swings got too bad, I can't see my psychiatrist until July  HISTORY OF PRESENT ILLNESS (HPI)  The patient presented to  ED intoxicated with feelings of depression and thoughts of wanting to die yet becomes fearful of dying from her COPD as she really wants to live; denied SI, talks futuristically;   Hx of treatment for  bipolar disorder, alcohol use disorder   Currently prescribed: escitalopram   20mg  aripiprazole  2mg  lamotrigine  100mg  po daily she takes her medication as prescribed  Mood depressed, irritable, anxious, reports her moods  were heading toward depression, irritability before she began drinking;  drinking to help her mood she thought; increased life stressors;  reports has frequent worry about dying in her sleep, has panic attacks when having difficulty breathing with her COPD so she will stay up for days. Hasn't slept in the past 3 days;    has 5 grandbabies and a set of twins grandbabies being born I really want to get better and live for them. There is no evidence of psychosis or delusional thinking. Denied AV hallucinations;  Client denied  episodes of erratic/excessive spending, involvement in dangerous activities, self-inflated ego, grandiosity, or promiscuity.   Appetite normal concentration fluctuates, denied racy thoughts, No self-harm behaviors. Reviewed active medication list/reviewed labs. Obtained Collateral information from medical record.  No EKG results available this encounter  PAST PSYCHIATRIC HISTORY    Previous Psychiatric Hospitalizations: denied  Previous Detox/Residential treatments: denied  Outpt treatment:  has o/p psychiatrist and psychotherapist Previous psychotropic medication trials: bupropion  (made me go crazy), sertraline  (stopped working) lorazepam  Previous mental health diagnosis per client/MEDICAL RECORD NUMBERbipolar, depression, anxiety, alcohol use   Suicide attempts/self-injurious behaviors:  denied history of suicidal/homicidal ideation/gestures; denied history of self-harm behaviors     PAST MEDICAL HISTORY  Past Medical History:  Diagnosis Date   Abnormal uterine bleeding (AUB) 08/26/2018   Anxiety    C. difficile diarrhea    07/04/21   Chicken pox    Depression    Depression, major, single episode, moderate (HCC) 08/04/2018   Diarrhea 06/07/2022   Dyspnea on exertion 05/24/2022   Elevated testosterone  level in female    Emphysema of lung (HCC)    bronchitis   Frequent headaches    Generalized anxiety disorder 08/23/2014   Hay fever    Hypertension    Idiopathic  thrombocytopenic purpura (ITP) (HCC)    Iron deficiency 09/02/2017   Positive ANA (antinuclear antibody)    Thrombocytopenia (HCC) 02/12/2016     HOME MEDICATIONS  Facility Ordered Medications  Medication   [COMPLETED] LORazepam  (ATIVAN ) tablet 1 mg   nicotine  (NICODERM CQ  - dosed in mg/24 hr) patch 7 mg   [COMPLETED] ondansetron  (ZOFRAN -ODT) disintegrating tablet 4 mg   LORazepam  (ATIVAN ) tablet 1-4 mg   thiamine (VITAMIN B1) tablet 100 mg   Or   thiamine (VITAMIN B1) injection 100 mg   folic acid  (FOLVITE ) tablet 1 mg   multivitamin with minerals tablet 1 tablet   PTA Medications  Medication Sig   acetaminophen  (TYLENOL ) 500 MG tablet Take 500-1,000 mg by mouth every 6 (six) hours as needed for mild pain or headache.   cyanocobalamin  (VITAMIN B12) 1000 MCG/ML injection INJECT 1 ML (1,000 MCG TOTAL) INTO THE MUSCLE EVERY 30 DAYS.   escitalopram  (LEXAPRO ) 20 MG tablet Take 1 tablet (20 mg total) by mouth daily.   traZODone  (DESYREL ) 50 MG tablet TAKE 0.5-1 TABLETS BY MOUTH AT BEDTIME AS NEEDED FOR SLEEP.   D3-50 1.25 MG (50000 UT) capsule Take 50,000 Units by mouth once a week.   amLODipine  (NORVASC ) 10 MG tablet Take 10 mg by mouth daily.   albuterol  (PROVENTIL ) (  2.5 MG/3ML) 0.083% nebulizer solution Take 3 mLs (2.5 mg total) by nebulization every 6 (six) hours as needed for wheezing or shortness of breath.   omeprazole  (PRILOSEC) 20 MG capsule TAKE 1 CAPSULE BY MOUTH EVERY DAY   albuterol  (VENTOLIN  HFA) 108 (90 Base) MCG/ACT inhaler INHALE 2 PUFFS INTO THE LUNGS EVERY 6 HOURS AS NEEDED FOR WHEEZING OR SHORTNESS OF BREATH (COUGH)   BREZTRI  AEROSPHERE 160-9-4.8 MCG/ACT AERO inhaler INHALE 2 PUFFS INTO THE LUNGS IN THE MORNING AND AT BEDTIME.   ARIPiprazole  (ABILIFY ) 2 MG tablet Take 1 tablet (2 mg total) by mouth 2 (two) times daily.   varenicline  (CHANTIX ) 1 MG tablet Take 1 tablet (1 mg total) by mouth 2 (two) times daily.   lamoTRIgine  (LAMICTAL ) 25 MG tablet Take 3 tablets (75 mg  total) by mouth daily.   benzonatate  (TESSALON ) 100 MG capsule Take 2 capsules (200 mg total) by mouth 3 (three) times daily as needed.   nicotine  (NICODERM CQ  - DOSED IN MG/24 HOURS) 14 mg/24hr patch Place 1 patch (14 mg total) onto the skin daily.   metoprolol  succinate (TOPROL -XL) 100 MG 24 hr tablet TAKE 1 TABLET BY MOUTH DAILY. TAKE WITH OR IMMEDIATELY FOLLOWING A MEAL.    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      Have you used/abused any of the following (include frequency/amt/last use):  a. Tobacco products Y  quit May 8th  b. ETOH Y  last drink  yesterday drinking 1 pt/day for past week  c. Cannabis N    d. Cocaine N   e. Prescription Stimulants N   f. Methamphetamine N    g. Inhalants N   h. Sedative/sleeping pills N  i. Hallucinogens N    j. Street Opioids N    k. Prescription opioids N   l. Other: specify (spice, K2, bath salts, etc.)  N   Any history of substance related:  Blackouts:   - Tremors: +   DUI:  -  D/T's: -  seizures: -      UDS negative/  BAL 397        FAMILY HISTORY  Family History  Problem Relation Age of Onset   Hyperlipidemia Mother    Heart disease Mother    Stroke Mother    Depression Mother    Mental illness Mother    Heart attack Mother 76   Hyperlipidemia Father    Depression Father    Mental illness Father    Diabetes Paternal Grandfather    Cancer Cousin      MENTAL STATUS EXAM (MSE)  Mental Status Exam: General Appearance: Casual and Disheveled  Orientation:  Full (Time, Place, and Person)  Memory:  Immediate;   Good Recent;   Good Remote;   Good  Concentration:  Concentration: Good  Recall:  Good  Attention  Good  Eye Contact:  Good  Speech:  Clear and  Coherent  Language:  Good  Volume:  Normal  Mood: anxious depressed  Affect:  Appropriate  Thought Process:  Goal Directed  Thought Content:  Logical  Suicidal Thoughts:  No  Homicidal Thoughts:  No  Judgement:  Impaired  Insight:  Fair  Psychomotor Activity:  Normal  Akathisia:  Negative  Fund of Knowledge:  Good    Assets:  Communication Skills Housing Social Support  Cognition:  WNL  ADL's:  Intact  AIMS (if indicated):       VITALS  Blood pressure 139/80, pulse (!) 105, temperature 98 F (36.7 C), temperature source Oral, resp. rate 16, height 5' 3 (1.6 m), weight 99.8 kg, SpO2 95%.  LABS  Admission on 01/10/2024  Component Date Value Ref Range Status   Sodium 01/10/2024 137  135 - 145 mmol/L Final   Potassium 01/10/2024 4.1  3.5 - 5.1 mmol/L Final   Chloride 01/10/2024 97 (L)  98 - 111 mmol/L Final   CO2 01/10/2024 27  22 - 32 mmol/L Final   Glucose, Bld 01/10/2024 140 (H)  70 - 99 mg/dL Final   Glucose reference range applies only to samples taken after fasting for at least 8 hours.   BUN 01/10/2024 7  6 - 20 mg/dL Final   Creatinine, Ser 01/10/2024 0.57  0.44 - 1.00 mg/dL Final   Calcium 93/72/7974 8.9  8.9 - 10.3 mg/dL Final   Total Protein 93/72/7974 6.8  6.5 - 8.1 g/dL Final   Albumin 93/72/7974 3.4 (L)  3.5 - 5.0 g/dL Final   AST 93/72/7974 164 (H)  15 - 41 U/L Final   ALT 01/10/2024 45 (H)  0 - 44 U/L Final   Alkaline Phosphatase 01/10/2024 264 (H)  38 - 126 U/L Final   Total Bilirubin 01/10/2024 0.9  0.0 - 1.2 mg/dL Final   GFR, Estimated 01/10/2024 >60  >60 mL/min Final   Comment: (NOTE) Calculated using the CKD-EPI Creatinine Equation (2021)    Anion gap 01/10/2024 13  5 - 15 Final   Performed at [facility], [address] [place], [place] [address]   Alcohol, Ethyl (B) 01/10/2024 397 (HH)  <15 mg/dL Final   Comment: CRITICAL RESULT CALLED TO, READ BACK BY AND VERIFIED WITH LUKE SAR, RN 01/10/2024 2042 JL (NOTE) For medical purposes  only. Performed at [facility], [address] [place], [place] [address]    WBC 01/10/2024 9.7  4.0 - 10.5 K/uL Final   RBC 01/10/2024 4.68  3.87 - 5.11 MIL/uL Final   Hemoglobin 01/10/2024 13.7  12.0 - 15.0 g/dL Final   HCT 93/72/7974 41.8  36.0 - 46.0 % Final   MCV 01/10/2024 89.3  80.0 - 100.0 fL Final   MCH 01/10/2024 29.3  26.0 - 34.0 pg Final   MCHC 01/10/2024 32.8  30.0 - 36.0 g/dL Final   RDW 93/72/7974 16.6 (H)  11.5 - 15.5 % Final   Platelets 01/10/2024 51 (L)  150 - 400 K/uL Final   Comment: Immature Platelet Fraction may be clinically indicated, consider ordering this additional test [id number] PLATELETS APPEAR DECREASED    nRBC 01/10/2024 0.0  0.0 - 0.2 % Final   Performed at [facility], [address] [place], [place] [address]   Tricyclic, Ur Screen 01/10/2024 NONE DETECTED  NONE DETECTED Final   Amphetamines, Ur Screen 01/10/2024 NONE DETECTED  NONE DETECTED Final   MDMA (Ecstasy)Ur Screen 01/10/2024 NONE DETECTED  NONE DETECTED Final   Cocaine Metabolite,Ur [place] 01/10/2024 NONE DETECTED  NONE DETECTED Final   Opiate, Ur Screen 01/10/2024 NONE DETECTED  NONE DETECTED Final   Phencyclidine (PCP) Ur S 01/10/2024 NONE DETECTED  NONE DETECTED Final   Cannabinoid 50 Ng, Ur [place] 01/10/2024 NONE DETECTED  NONE DETECTED Final   Barbiturates, Ur Screen 01/10/2024 NONE DETECTED  NONE DETECTED Final   Benzodiazepine, Ur Scrn 01/10/2024 NONE DETECTED  NONE DETECTED Final   Methadone Scn, Ur 01/10/2024 NONE DETECTED  NONE DETECTED Final   Comment: (NOTE) Tricyclics + metabolites, urine    Cutoff 1000 ng/mL Amphetamines + metabolites, urine  Cutoff 1000 ng/mL MDMA (Ecstasy), urine              Cutoff 500 ng/mL Cocaine Metabolite, urine          Cutoff 300 ng/mL Opiate + metabolites, urine        Cutoff 300 ng/mL Phencyclidine (PCP), urine         Cutoff 25 ng/mL Cannabinoid, urine                 Cutoff 50 ng/mL Barbiturates + metabolites, urine  Cutoff  200 ng/mL Benzodiazepine, urine              Cutoff 200 ng/mL Methadone, urine                   Cutoff 300 ng/mL  The urine drug screen provides only a preliminary, unconfirmed analytical test result and should not be used for non-medical purposes. Clinical consideration and professional judgment should be applied to any positive drug screen result due to possible interfering substances. A more specific alternate chemical method must be used in order to obtain a confirmed analytical result. Gas chromatography / mass spectrometry (GC/MS) is the preferred confirm                          atory method. Performed at [facility], [address] [place], [place] [address]     PSYCHIATRIC REVIEW OF SYSTEMS (ROS)  Depression:      []  Denies all symptoms of depression [x] Depressed mood       [] Insomnia/hypersomnia              [x] Fatigue        [] Change in appetite     [x] Anhedonia                                [] Difficulty concentrating      [] Hopelessness             [] Worthlessness [x] Guilt/shame                [] Psychomotor agitation/retardation   Mania:     [] Denies all symptoms of mania [] Elevated mood           [x] Irritability         [] Pressured speech         []  Grandiosity         [x]  Decreased need for sleep                                                 [] Increased energy          []  Increase in goal directed activity                                       [] Flight of ideas    []  Excessive involvement  in high-risk behaviors                   []  Distractibility     Psychosis:     [x] Denies all symptoms of psychosis [] Paranoia         []  Auditory Hallucinations          [] Visual hallucinations         [] ELOC        [] IOR                [] Delusions   Suicide:    [x]  Denies SI/plan/intent []  Passive SI         []   Active SI         [] Plan           [] Intent   Homicide:  [x]   Denies HI/plan/intent []  Passive HI         []  Active HI         [] Plan            [] Intent            [] Identified Target    Additional findings:      Musculoskeletal: No abnormal movements observed      Gait & Station: Normal      Pain Screening: none reported      Nutrition & Dental Concerns: none reported  RISK FORMULATION/ASSESSMENT  Is the patient experiencing any suicidal or homicidal ideations: No       Explain if yes:  Protective factors considered for safety management:   Absence of psychosis Access to adequate health care Advice& help seeking Resourcefulness/Survival skills Children Sense of responsibility Positive social support: Positive therapeutic relationship Future oriented Suicide Inquiry:  Denies suicidal ideations, intentions, or plans.  Denies recent self-harm behavior. Talks futuristically.  Risk factors/concerns considered for safety management:  Depression Substance abuse/dependence Physical illness/chronic pain Access to lethal means  Is there a safety management plan with the patient and treatment team to minimize risk factors and promote protective factors: Yes           Explain: per medical tx plan Is crisis care placement or psychiatric hospitalization recommended: No     Based on my current evaluation and risk assessment, patient is determined at this time to be at:  Low risk  Global Suicide Risk Assessment: The Patient is found to be at Low risk of suicide or violence; however, risk lethality increased under context of drugs/alcohol. Encourage to abstain; At the time of disposition, the patient adamantly denies SI/HI ideations, plans or intent and has the ability to determine right from wrong, and can anticipate the potential consequences of behaviors and actions. She is a low risk for self-harm based on her current clinical presentation and her risk and protective factors.   *RISK ASSESSMENT Risk assessment is a dynamic process; it is possible that this patient's condition, and risk level, may change. This should be re-evaluated and  managed over time as appropriate. Please re-consult psychiatric consult services if additional assistance is needed in terms of risk assessment and management. If your team decides to discharge this patient, please advise the patient how to best access emergency psychiatric services, or to call 911, if their condition worsens or they feel unsafe in any way.  Total time spent in this encounter was 60 minutes with greater than 50% of time spent in counseling and coordination of care.     Dr. Odessia JUDITHANN Ada, PhD, MSN, APRN, PMHNP-BC, [facility] Fredrica Capano   KANDICE Ada, NP Telepsychiatry Consult Services

## 2024-01-11 NOTE — ED Notes (Signed)
 Lunch tray served with ice tea

## 2024-01-11 NOTE — Final Consult Note (Addendum)
 Patient evaluated by psychiatry @624am , dx with alcohol use disorder, bipolar disorder, mixed.  However, the medical team asked for clarification regarding disposition, thus patient seen briefly by this writer today.   Patient seen for brief reassessment by this Clinical research associate. She admits to under treated depression, subtherapeutic response on current medications; passive SI and using alcohol as a means to escape mounting family/psychosocial stressors.  Discussed my safety concerns with her, alcoholic binging (disinhibition and interference with therapeutic response of psychotropic medications) as a means of ending her life. Based on above, she is recommended for inpatient admission.  This was discussed with patient who did not object. Patient agrees to sign voluntary paperwork and all of her questions were answered.

## 2024-01-11 NOTE — ED Notes (Signed)
Vol /psych consult pending 

## 2024-01-11 NOTE — ED Notes (Signed)
Pt given hospital provided meal.  

## 2024-01-11 NOTE — Group Note (Signed)
 Date:  01/11/2024 Time:  8:43 PM  Group Topic/Focus:  Wrap-Up Group:   The focus of this group is to help patients review their daily goal of treatment and discuss progress on daily workbooks.    Participation Level:  Active  Participation Quality:  Appropriate and Attentive  Affect:  Appropriate  Cognitive:  Alert  Insight: Good  Engagement in Group:  Engaged  Modes of Intervention:  Discussion  Additional Comments:     Maglione,Jayleana Colberg E 01/11/2024, 8:43 PM

## 2024-01-11 NOTE — Plan of Care (Signed)
  Problem: Education: Goal: Emotional status will improve Outcome: Progressing Goal: Mental status will improve Outcome: Progressing Goal: Verbalization of understanding the information provided will improve Outcome: Progressing   Problem: Safety: Goal: Periods of time without injury will increase 01/11/2024 1642 by Neill Leita POUR, RN Outcome: Progressing 01/11/2024 1642 by Neill Leita POUR, RN Outcome: Progressing

## 2024-01-11 NOTE — ED Provider Notes (Signed)
 Emergency Medicine Observation Re-evaluation Note   BP 139/80 (BP Location: Right Arm)   Pulse (!) 105   Temp 98 F (36.7 C) (Oral)   Resp 16   Ht 5' 3 (1.6 m)   Wt 99.8 kg   SpO2 95%   BMI 38.97 kg/m    ED Course / MDM   No reported events during my shift at the time of this note.   Pt is awaiting dispo from consultants   Ginnie Shams MD    Shams Ginnie, MD 01/11/24 [phone]

## 2024-01-11 NOTE — TOC Progression Note (Signed)
 Transition of Care [facility]) - Progression Note    Patient Details  Name: Carly [name] MRN: [id number] Date of Birth: [birth year]  [facility]) CM/SW Contact  [place] Haely [place], [place] Phone Number: 01/11/2024, 8:24 AM  Clinical Narrative:     CSW added discharge instructions in AVS for substance abuse resources.        Expected Discharge Plan and Services                                               Social Determinants of Health (SDOH) Interventions SDOH Screenings   Food Insecurity: No Food Insecurity (11/21/2023)  Housing: Low Risk  (11/21/2023)  Transportation Needs: No Transportation Needs (11/21/2023)  Utilities: Not At Risk (11/21/2023)  Depression (PHQ2-9): Low Risk  (08/20/2023)  Social Connections: Moderately Isolated (08/07/2023)  Tobacco Use: High Risk (01/11/2024)    Readmission Risk Interventions     No data to display

## 2024-01-11 NOTE — Group Note (Signed)
 Date:  01/11/2024 Time:  2:55 PM  Group Topic/Focus:  Self Care:   The focus of this group is to help patients understand the importance of self-care in order to improve or restore emotional, physical, spiritual, interpersonal, and financial health. Wellness Toolbox:   The focus of this group is to discuss various aspects of wellness, balancing those aspects and exploring ways to increase the ability to experience wellness.  Patients will create a wellness toolbox for use upon discharge.    Participation Level:  Did Not Attend  Participation Quality:    Affect:    Cognitive:    Insight:   Engagement in Group:    Modes of Intervention:    Additional Comments:   Carly [name] L Carly [name] 01/11/2024, 2:55 PM

## 2024-01-12 DIAGNOSIS — F3181 Bipolar II disorder: Secondary | ICD-10-CM | POA: Diagnosis present

## 2024-01-12 MED ORDER — LOPERAMIDE HCL 2 MG PO CAPS
2.0000 mg | ORAL_CAPSULE | ORAL | Status: DC | PRN
  Administered 2024-01-12 – 2024-01-13 (×2): 2 mg via ORAL
  Filled 2024-01-12 (×2): qty 1

## 2024-01-12 MED ORDER — METOPROLOL SUCCINATE ER 100 MG PO TB24: 100.0000 mg | ORAL_TABLET | Freq: Every day | ORAL | 0 refills | Status: DC

## 2024-01-12 MED ORDER — PROPRANOLOL HCL 20 MG PO TABS
10.0000 mg | ORAL_TABLET | Freq: Three times a day (TID) | ORAL | Status: DC | PRN
  Administered 2024-01-12 – 2024-01-13 (×4): 10 mg via ORAL
  Filled 2024-01-12 (×5): qty 1

## 2024-01-12 MED ORDER — LAMOTRIGINE 25 MG PO TABS
25.0000 mg | ORAL_TABLET | Freq: Every day | ORAL | Status: DC
  Administered 2024-01-13 – 2024-01-15 (×3): 25 mg via ORAL
  Filled 2024-01-12 (×3): qty 1

## 2024-01-12 MED ORDER — PROPRANOLOL HCL 10 MG PO TABS: 10.0000 mg | ORAL_TABLET | Freq: Three times a day (TID) | ORAL | 0 refills | Status: DC | PRN

## 2024-01-12 MED ORDER — ARIPIPRAZOLE 5 MG PO TABS: 5.0000 mg | ORAL_TABLET | Freq: Every day | ORAL | 0 refills | Status: DC

## 2024-01-12 MED ORDER — TRAZODONE HCL 50 MG PO TABS
50.0000 mg | ORAL_TABLET | Freq: Every day | ORAL | Status: DC
  Administered 2024-01-12 – 2024-01-14 (×3): 50 mg via ORAL
  Filled 2024-01-12 (×3): qty 1

## 2024-01-12 MED ORDER — LAMOTRIGINE 25 MG PO TABS: 25.0000 mg | ORAL_TABLET | Freq: Every day | ORAL | 0 refills | Status: DC

## 2024-01-12 NOTE — Group Note (Signed)
 Date:  01/12/2024 Time:  9:05 PM  Group Topic/Focus:  Self Care:   The focus of this group is to help patients understand the importance of self-care in order to improve or restore emotional, physical, spiritual, interpersonal, and financial health. Stages of Change:   The focus of this group is to explain the stages of change and help patients identify changes they want to make upon discharge. Wrap-Up Group:   The focus of this group is to help patients review their daily goal of treatment and discuss progress on daily workbooks.    Participation Level:  Active  Participation Quality:  Appropriate and Attentive  Affect:  Appropriate  Cognitive:  Alert  Insight: Appropriate  Engagement in Group:  Engaged  Modes of Intervention:  Discussion and Socialization  Additional Comments:     [name],Carly Harp E 01/12/2024, 9:05 PM
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
## 2024-01-12 NOTE — Progress Notes (Signed)
   01/11/24 2000  Psych Admission Type (Psych Patients Only)  Admission Status Voluntary  Psychosocial Assessment  Patient Complaints Anxiety  Eye Contact Fair  Facial Expression Flat  Affect Sad  Speech Logical/coherent  Interaction Assertive  Motor Activity Slow  Appearance/Hygiene Improved  Behavior Characteristics Cooperative;Appropriate to situation  Mood Apprehensive;Pleasant  Thought Process  Coherency WDL  Content WDL  Delusions None reported or observed  Perception WDL  Hallucination None reported or observed  Judgment Poor  Confusion Severe  Danger to Self  Current suicidal ideation? Denies  Danger to Others  Danger to Others None reported or observed   Patient alert and oriented x 4, affect is blunted, thoughts are linear, affect is congruent with mood, she denies SI/HI/AVH. Patient was noted having experiencing SOB on exertion and wheezing noted. Respiratory was called for PRN nebulizer treatment, they also stated she is wheezing and suggested she an hand held inhaler ( RESCUE INHALER ) for emergency. Will pass on to oncoming nurse for HCP  to prescribe as needed.

## 2024-01-12 NOTE — H&P (Signed)
 Psychiatric Admission Assessment Adult  Patient Identification: Carly [name] MRN:  [id number] Date of Evaluation:  01/12/2024 Chief Complaint:  Alcohol-induced mood disorder with depressive symptoms (HCC) [F10.94]   History of Present Illness:  Patient is a 53 year old female with past medical history of COPD and emphysema, and past psychiatric history of bipolar II disorder without psychotic features and generalized anxiety disorder with panic episodes, who presented to the emergency department with acute emotional distress and reported excessive alcohol consumption in response to recent life stressors.  Patient presented to the emergency department reportedly "losing it," citing increased anxiety and alcohol use. She endorsed drinking approximately one pint of liquor daily for several weeks. She was found intoxicated with a blood alcohol level of 397 and initially expressed vague thoughts of wanting to die, which she now denies, attributing the statements to alcohol intoxication.  She is currently followed by a psychiatrist and takes escitalopram  20 mg, lamotrigine  100 mg (nonadherent for over one week), and aripiprazole  5 mg daily. ED provider increased aripiprazole  and recommended lamotrigine  be restarted at 25 mg. Naltrexone was discussed but deferred pending improvement in liver function tests (AST 164, ALT 45). Recent labs: AST: 164 ALT: 45 Creatinine: 1.01 EtOH: 397 UDS: Negative COVID: Negative CMP and CBC: WNL TSH: Ordered  Patient states, "I think I was just going crazy," describing a buildup of stress related to her husband's recent dementia diagnosis and job loss, which made her the sole provider in the household. She reports episodes of daily anxiety with chest palpitations and tremors, escalating to intense discomfort that often triggered alcohol use for symptom relief. She describes ongoing symptoms of depression, including persistent sadness, hopelessness, irritability,  worry, lack of motivation, and low energy. She also endorses symptoms of mood lability, stating her mood "goes up and down." During elevated periods, she experiences increased energy, elevated mood, and becomes hyperverbal.Patient reports she has been under the care of a psychiatrist for several years, who has diagnosed her with Bipolar II Disorder.  She denies current suicidal ideation or intent and has no history of suicide attempts. She attributes prior statements of hopelessness to intoxication and emphasizes her desire to stabilize. There is no current or history of AVH paranoia or delusions.   She denies any prior psychiatric hospitalizations but is currently under outpatient psychiatric care. She expresses motivation to engage in treatment, with her adult children and grandchildren serving as major sources of support.  Social History Birthplace: [place], [place]  Raised by: Father Education: Engineer, agricultural; some college Marital Status: Married Children: Two Human resources officer, two stepchildren Housing: Stable Support System: Adult children and grandchildren nearby Employment: [Not specified]  Substance Use: Tobacco: 1 pack per day, attempting cessation Alcohol: Recent escalation to 1 pint daily; prior social use only Illicit drugs / Rx abuse: Denied DUI / Rehab: None reported  Psychiatric History Diagnoses: Bipolar II disorder, GAD with panic episodes, alcohol use disorder Psychiatric Admissions: Denied Outpatient Psychiatric Care: Yes  Psychotropic Medications: Escitalopram  20 mg PO daily Lamotrigine  100 mg PO daily (missed doses >1.5 weeks) Aripiprazole  5 mg PO daily (recently increased) Medication Adherence: Generally compliant except with lamotrigine  Therapy: None currently History of Suicidality / Homicidality: Denied  Substance Abuse History Alcohol: Escalation in use secondary to stress; currently meets criteria for severe alcohol use disorder, reports  drinking a pint of liquor daily for past 2-3 weeks Tobacco: 1 ppd, trying to quit Illicit Drugs / Prescription Abuse: Denied Rehab / DUI: None    Total Time spent with patient:  1 hour Sleep  Sleep:Sleep: Good  Past Psychiatric History: Is the patient at risk to self? No.  Has the patient been a risk to self in the past 6 months? No.  Has the patient been a risk to self within the distant past? No.  Is the patient a risk to others? No.  Has the patient been a risk to others in the past 6 months? No.  Has the patient been a risk to others within the distant past? No.   [place] Scale:  Flowsheet Row Admission (Current) from 01/11/2024 in [facility] INPATIENT BEHAVIORAL MEDICINE ED from 01/10/2024 in [facility] Emergency Department at [facility] ED to Hosp-Admission (Discharged) from 11/21/2023 in [facility] 1C MEDICAL TELEMETRY  C-SSRS RISK CATEGORY Low Risk High Risk No Risk     Past Medical History:  Past Medical History:  Diagnosis Date   Abnormal uterine bleeding (AUB) 08/26/2018   Anxiety    C. difficile diarrhea    07/04/21   Chicken pox    Depression    Depression, major, single episode, moderate (HCC) 08/04/2018   Diarrhea 06/07/2022   Dyspnea on exertion 05/24/2022   Elevated testosterone  level in female    Emphysema of lung (HCC)    bronchitis   Frequent headaches    Generalized anxiety disorder 08/23/2014   Hay fever    Hypertension    Idiopathic thrombocytopenic purpura (ITP) (HCC)    Iron deficiency 09/02/2017   Positive ANA (antinuclear antibody)    Thrombocytopenia (HCC) 02/12/2016    Past Surgical History:  Procedure Laterality Date   HEMATOMA EVACUATION N/A 04/07/2020   Procedure: EVACUATION HEMATOMA  AND APPLICATION OF PRESSURE DRESSING;  Surgeon: Verdon Keen, MD;  Location: [facility] ORS;  Service: Gynecology;  Laterality: N/A;   HYSTEROSCOPY WITH NOVASURE N/A 02/21/2018   Procedure: HYSTEROSCOPY WITH NOVASURE, POLYPECTOMY;   Surgeon: Verdon Keen, MD;  Location: [facility] ORS;  Service: Gynecology;  Laterality: N/A;   TUBAL LIGATION  1997   TUBAL LIGATION     Family History:  Family History  Problem Relation Age of Onset   Hyperlipidemia Mother    Heart disease Mother    Stroke Mother    Depression Mother    Mental illness Mother    Heart attack Mother 53   Hyperlipidemia Father    Depression Father    Mental illness Father    Diabetes Paternal Grandfather    Cancer Cousin     Social History:  Social History   Substance and Sexual Activity  Alcohol Use Yes   Alcohol/week: 8.0 standard drinks of alcohol   Types: 8 Standard drinks or equivalent per week   Comment: Beer (Can)      Social History   Substance and Sexual Activity  Drug Use No   Comment: CBD oil      Allergies:   Allergies  Allergen Reactions   Wellbutrin  [Bupropion ]     Crazy    Meloxicam  Nausea Only   Lab Results:  Results for orders placed or performed during the hospital encounter of 01/10/24 (from the past 48 hours)  Comprehensive metabolic panel     Status: Abnormal   Collection Time: 01/10/24  7:57 PM  Result Value Ref Range   Sodium 137 135 - 145 mmol/L   Potassium 4.1 3.5 - 5.1 mmol/L   Chloride 97 (L) 98 - 111 mmol/L   CO2 27 22 - 32 mmol/L   Glucose, Bld 140 (H) 70 - 99 mg/dL    Comment:  Glucose reference range applies only to samples taken after fasting for at least 8 hours.   BUN 7 6 - 20 mg/dL   Creatinine, Ser 9.42 0.44 - 1.00 mg/dL   Calcium 8.9 8.9 - 89.6 mg/dL   Total Protein 6.8 6.5 - 8.1 g/dL   Albumin 3.4 (L) 3.5 - 5.0 g/dL   AST 835 (H) 15 - 41 U/L   ALT 45 (H) 0 - 44 U/L   Alkaline Phosphatase 264 (H) 38 - 126 U/L   Total Bilirubin 0.9 0.0 - 1.2 mg/dL   GFR, Estimated >39 >39 mL/min    Comment: (NOTE) Calculated using the CKD-EPI Creatinine Equation (2021)    Anion gap 13 5 - 15    Comment: Performed at [facility], [address] [place], [place] [address]  Ethanol      Status: Abnormal   Collection Time: 01/10/24  7:57 PM  Result Value Ref Range   Alcohol, Ethyl (B) 397 (HH) <15 mg/dL    Comment: CRITICAL RESULT CALLED TO, READ BACK BY AND VERIFIED WITH LUKE SAR, RN 01/10/2024 2042 JL (NOTE) For medical purposes only. Performed at [facility], [address] [place], [place] [address]   cbc     Status: Abnormal   Collection Time: 01/10/24  7:57 PM  Result Value Ref Range   WBC 9.7 4.0 - 10.5 K/uL   RBC 4.68 3.87 - 5.11 MIL/uL   Hemoglobin 13.7 12.0 - 15.0 g/dL   HCT 58.1 63.9 - 53.9 %   MCV 89.3 80.0 - 100.0 fL   MCH 29.3 26.0 - 34.0 pg   MCHC 32.8 30.0 - 36.0 g/dL   RDW 83.3 (H) 88.4 - 84.4 %   Platelets 51 (L) 150 - 400 K/uL    Comment: Immature Platelet Fraction may be clinically indicated, consider ordering this additional test [id number] PLATELETS APPEAR DECREASED    nRBC 0.0 0.0 - 0.2 %    Comment: Performed at [facility], [address], [place], [place] [address]  Urine Drug Screen, Qualitative     Status: None   Collection Time: 01/10/24  7:57 PM  Result Value Ref Range   Tricyclic, Ur Screen NONE DETECTED NONE DETECTED   Amphetamines, Ur Screen NONE DETECTED NONE DETECTED   MDMA (Ecstasy)Ur Screen NONE DETECTED NONE DETECTED   Cocaine Metabolite,Ur [place] NONE DETECTED NONE DETECTED   Opiate, Ur Screen NONE DETECTED NONE DETECTED   Phencyclidine (PCP) Ur S NONE DETECTED NONE DETECTED   Cannabinoid 50 Ng, Ur [place] NONE DETECTED NONE DETECTED   Barbiturates, Ur Screen NONE DETECTED NONE DETECTED   Benzodiazepine, Ur Scrn NONE DETECTED NONE DETECTED   Methadone Scn, Ur NONE DETECTED NONE DETECTED    Comment: (NOTE) Tricyclics + metabolites, urine    Cutoff 1000 ng/mL Amphetamines + metabolites, urine  Cutoff 1000 ng/mL MDMA (Ecstasy), urine              Cutoff 500 ng/mL Cocaine Metabolite, urine          Cutoff 300 ng/mL Opiate + metabolites, urine        Cutoff 300 ng/mL Phencyclidine (PCP), urine          Cutoff 25 ng/mL Cannabinoid, urine                 Cutoff 50 ng/mL Barbiturates + metabolites, urine  Cutoff 200 ng/mL Benzodiazepine, urine              Cutoff 200 ng/mL Methadone, urine  Cutoff 300 ng/mL  The urine drug screen provides only a preliminary, unconfirmed analytical test result and should not be used for non-medical purposes. Clinical consideration and professional judgment should be applied to any positive drug screen result due to possible interfering substances. A more specific alternate chemical method must be used in order to obtain a confirmed analytical result. Gas chromatography / mass spectrometry (GC/MS) is the preferred confirm atory method. Performed at [facility], [address], [place], [place] [address]     Blood Alcohol level:  Lab Results  Component Value Date   ETH 397 [facility]) 01/10/2024    Metabolic Disorder Labs:  Lab Results  Component Value Date   HGBA1C 5.4 11/06/2022   MPG 117 09/09/2022   Lab Results  Component Value Date   PROLACTIN 4.4 09/23/2014   Lab Results  Component Value Date   CHOL 170 11/06/2022   TRIG 101.0 11/06/2022   HDL 46.20 11/06/2022   CHOLHDL 4 11/06/2022   VLDL 20.2 11/06/2022   LDLCALC 103 (H) 11/06/2022   LDLCALC 140 (H) 06/10/2019    Current Medications: Current Facility-Administered Medications  Medication Dose Route Frequency Provider Last Rate Last Admin   albuterol  (PROVENTIL ) (2.5 MG/3ML) 0.083% nebulizer solution 2.5 mg  2.5 mg Nebulization Q6H PRN Mills, Shnese E, NP   2.5 mg at 01/12/24 0606   alum & mag hydroxide-simeth (MAALOX/MYLANTA) 200-200-20 MG/5ML suspension 30 mL  30 mL Oral Q4H PRN Mills, Shnese E, NP       ARIPiprazole  (ABILIFY ) tablet 5 mg  5 mg Oral Daily Mills, Shnese E, NP   5 mg at 01/12/24 0800   folic acid  (FOLVITE ) tablet 1 mg  1 mg Oral Daily Mills, Shnese E, NP   1 mg at 01/12/24 0800   ibuprofen  (ADVIL ) tablet 400 mg  400 mg Oral TID PRN Mills,  Shnese E, NP   400 mg at 01/12/24 0836   [START ON 01/13/2024] lamoTRIgine  (LAMICTAL ) tablet 25 mg  25 mg Oral Daily Cleotilde Hoy HERO, NP       LORazepam  (ATIVAN ) tablet 1-4 mg  1-4 mg Oral Q1H PRN Moishe Burton E, NP   2 mg at 01/11/24 2123   metoprolol  succinate (TOPROL -XL) 24 hr tablet 100 mg  100 mg Oral Daily Mills, Shnese E, NP   100 mg at 01/12/24 0800   multivitamin with minerals tablet 1 tablet  1 tablet Oral Daily Mills, Shnese E, NP   1 tablet at 01/12/24 0800   nicotine  (NICODERM CQ  - dosed in mg/24 hours) patch 21 mg  21 mg Transdermal Daily Jadapalle, Sree, MD   21 mg at 01/12/24 0801   nicotine  polacrilex (NICORETTE ) gum 2 mg  2 mg Oral PRN Jadapalle, Sree, MD   2 mg at 01/11/24 1558   OLANZapine (ZYPREXA) injection 10 mg  10 mg Intramuscular TID PRN Mills, Shnese E, NP       OLANZapine (ZYPREXA) injection 5 mg  5 mg Intramuscular TID PRN Mills, Shnese E, NP       OLANZapine zydis (ZYPREXA) disintegrating tablet 5 mg  5 mg Oral TID PRN Mills, Shnese E, NP       pantoprazole  (PROTONIX ) EC tablet 40 mg  40 mg Oral Daily Mills, Shnese E, NP   40 mg at 01/12/24 0800   thiamine (VITAMIN B1) tablet 100 mg  100 mg Oral Daily Mills, Shnese E, NP   100 mg at 01/12/24 0800   Or   thiamine (VITAMIN B1) injection 100  mg  100 mg Intravenous Daily Mills, Shnese E, NP       traZODone  (DESYREL ) tablet 50 mg  50 mg Oral QHS Cleotilde Hoy HERO, NP       PTA Medications: Medications Prior to Admission  Medication Sig Dispense Refill Last Dose/Taking   albuterol  (PROVENTIL ) (2.5 MG/3ML) 0.083% nebulizer solution Take 3 mLs (2.5 mg total) by nebulization every 6 (six) hours as needed for wheezing or shortness of breath. 150 mL 1 Past Month   albuterol  (VENTOLIN  HFA) 108 (90 Base) MCG/ACT inhaler INHALE 2 PUFFS INTO THE LUNGS EVERY 6 HOURS AS NEEDED FOR WHEEZING OR SHORTNESS OF BREATH (COUGH) 6.7 each 3 Past Month   BREZTRI  AEROSPHERE 160-9-4.8 MCG/ACT AERO inhaler INHALE 2 PUFFS INTO THE LUNGS IN THE  MORNING AND AT BEDTIME. 10.7 each 5 01/12/2024 Morning   cyanocobalamin  (VITAMIN B12) 1000 MCG/ML injection INJECT 1 ML (1,000 MCG TOTAL) INTO THE MUSCLE EVERY 30 DAYS. 1 mL 15 Past Month   D3-50 1.25 MG (50000 UT) capsule Take 50,000 Units by mouth once a week.   Past Week   escitalopram  (LEXAPRO ) 20 MG tablet Take 1 tablet (20 mg total) by mouth daily.   01/11/2024   lamoTRIgine  (LAMICTAL ) 100 MG tablet Take 100 mg by mouth daily.   01/11/2024   metoprolol  succinate (TOPROL -XL) 100 MG 24 hr tablet TAKE 1 TABLET BY MOUTH DAILY. TAKE WITH OR IMMEDIATELY FOLLOWING A MEAL. 30 tablet 2 01/11/2024   omeprazole  (PRILOSEC) 20 MG capsule TAKE 1 CAPSULE BY MOUTH EVERY DAY 90 capsule 1 01/11/2024   traZODone  (DESYREL ) 50 MG tablet TAKE 0.5-1 TABLETS BY MOUTH AT BEDTIME AS NEEDED FOR SLEEP.   01/11/2024   acetaminophen  (TYLENOL ) 500 MG tablet Take 500-1,000 mg by mouth every 6 (six) hours as needed for mild pain or headache.   Unknown   benzonatate  (TESSALON ) 100 MG capsule Take 2 capsules (200 mg total) by mouth 3 (three) times daily as needed. 20 capsule 0 More than a month    Psychiatric Specialty Exam:  Presentation  General Appearance:  Fairly Groomed  Eye Contact: Good  Speech: Normal Rate  Speech Volume: Normal    Mood and Affect  Mood: Euthymic  Affect: Flat   Thought Process  Thought Processes: Goal Directed  Descriptions of Associations:Intact  Orientation:Full (Time, Place and Person)  Thought Content:Logical  Hallucinations:Hallucinations: None  Ideas of Reference:None  Suicidal Thoughts:Suicidal Thoughts: No  Homicidal Thoughts:Homicidal Thoughts: No   Sensorium  Memory:No data recorded Judgment: Fair  Insight: Fair   Art therapist  Concentration: Good  Attention Span: Good  Recall: Good  Fund of Knowledge: Good  Language: Good   Psychomotor Activity  Psychomotor Activity: Psychomotor Activity: Normal   Assets   Assets: Housing; Intimacy; Resilience; Social Support; Manufacturing systems engineer; Vocational/Educational    Musculoskeletal: Strength & Muscle Tone: within normal limits Gait & Station: normal  Physical Exam: Physical Exam ROS Blood pressure (!) 148/82, pulse (!) 102, temperature 98.4 F (36.9 C), resp. rate (!) 21, height 5' 2 (1.575 m), weight 99.8 kg, SpO2 93%. Body mass index is 40.24 kg/m.  Principal Diagnosis: Alcohol-induced mood disorder with depressive symptoms (HCC) Diagnosis:  Principal Problem:   Alcohol-induced mood disorder with depressive symptoms (HCC) Active Problems:   Bipolar 2 disorder [facility])   Clinical Decision Making:  Treatment Plan Summary: Patient presentation meets diagnostic criteria for Alcohol Use Disorder, severe, with functional impairment related to emotional distress and daily alcohol consumption. She endorses a long-standing history of mood instability, consistent  with Bipolar II Disorder, including episodes of depression marked by sadness, hopelessness, and low energy, as well as brief periods of elevated mood, increased energy, and hyperverbal speech.  She also meets criteria for Generalized Anxiety Disorder with panic episodes, reporting daily physical symptoms of anxiety that have contributed to substance misuse. Current insight and motivation are intact, and she remains appropriate for inpatient psychiatric treatment focused on stabilization and relapse prevention.  She denies current suicidal ideation and demonstrates insight into her condition. She is not presenting psychotic or manic.    Safety and Monitoring:             -- Voluntary admission to inpatient psychiatric unit for safety, stabilization and treatment             -- Daily contact with patient to assess and evaluate symptoms and progress in treatment             -- Patient's case to be discussed in multi-disciplinary team meeting             -- Observation Level: q15 minute  checks             -- Vital signs:  q12 hours             -- Precautions: suicide, elopement, and assault   2. Psychiatric Diagnoses and Treatment:               Risk Factors: Recent escalation in alcohol use Chronic medical illness (COPD/emphysema) Illness of loved one (husband's dementia diagnosis)  Protective Factors: Married with emotional support from husband Adult children and grandchildren nearby Stable housing Expresses motivation for change  Psychiatric Diagnosis Alcohol Use Disorder, severe  Bipolar II Disorder  Generalized Anxiety Disorder with panic episodes   Psychiatric Medications Escitalopram  20 mg PO daily - continue Lamotrigine  25 mg PO daily - restart due to missed doses; plan to titrate Aripiprazole  5 mg PO daily - continue (per ED adjustment) Propranolol 10 mg PO TID PRN - start for somatic anxiety (palpitations, tremors) CIWA monitoring per protocol with prn Ativan   Monitor LFTs; consider naltrexone for alcohol use when enzymes normalize  Plan It is the judgment of the undersigned that patient is appropriate for treatment in this acute psychiatric setting related to concern for safety in the context of alcohol misuse, significant stressors, and destabilized mood and anxiety.     -- The risks/benefits/side-effects/alternatives to this medication were discussed in detail with the patient and time was given for questions. The patient consents to medication trial.                -- Metabolic profile and EKG monitoring obtained while on an atypical antipsychotic (BMI: Lipid Panel: HbgA1c: QTc:)              -- Encouraged patient to participate in unit milieu and in scheduled group therapies                            3. Medical Issues Being Addressed: Monitor for any COPD/emphysema exacerbations during hospitalization     4. Discharge Planning:              -- Social work and case management to assist with discharge planning and identification of hospital  follow-up needs prior to discharge             -- Estimated LOS: 5-7 days             --  Discharge Concerns: Need to establish a safety plan; Medication compliance and effectiveness             -- Discharge Goals: Return home with outpatient referrals follow ups  Physician Treatment Plan for Primary Diagnosis: Alcohol-induced mood disorder with depressive symptoms (HCC) Long Term Goal(s): Improvement in symptoms so as ready for discharge  Short Term Goals: Ability to identify changes in lifestyle to reduce recurrence of condition will improve  Physician Treatment Plan for Secondary Diagnosis: Principal Problem:   Alcohol-induced mood disorder with depressive symptoms (HCC) Active Problems:   Bipolar 2 disorder (HCC)  Long Term Goal(s): Improvement in symptoms so as ready for discharge  Short Term Goals: Ability to identify changes in lifestyle to reduce recurrence of condition will improve  I certify that inpatient services furnished can reasonably be expected to improve the patient's condition.    Hoy CHRISTELLA Pinal, NP 6/29/202510:11 AM

## 2024-01-12 NOTE — [facility] Suicide Risk Assessment (Signed)
 [facility] INPATIENT:  Family/Significant Other Suicide Prevention Education  Suicide Prevention Education:  Education Completed; Carly [name], [phone], Husband, has been identified by the patient as the family member/significant other with whom the patient will be residing, and identified as the person(s) who will aid the patient in the event of a mental health crisis (suicidal ideations/suicide attempt).  With written consent from the patient, the family member/significant other has been provided the following suicide prevention education, prior to the and/or following the discharge of the patient.  The suicide prevention education provided includes the following: Suicide risk factors Suicide prevention and interventions National Suicide Hotline telephone number [facility] assessment telephone number [facility] Emergency Assistance 911 [facility] and/or Residential Mobile Crisis Unit telephone number  Request made of family/significant other to: Remove weapons (e.g., guns, rifles, knives), all items previously/currently identified as safety concern.   Remove drugs/medications (over-the-counter, prescriptions, illicit drugs), all items previously/currently identified as a safety concern.  The family member/significant other verbalizes understanding of the suicide prevention education information provided.  The family member/significant other agrees to remove the items of safety concern listed above.  According to patient's husband, "she is having a mental breakdown." Husband reports "I thought we had it under control for a long time but about 3-4 days ago it seemed like it was progressing." Husband reports that he isn't aware of any stressors. Husband reports "I noticed her change was getting worst." Husband reports that he has no safety concerns. Husband reports that he believes that the patient was "going into a deep depression." Husband reports that the patient can return  home at discharge. Husband reports that the patient did not have access to any weapons. Husband reports that the patient can return home at discharge.   Carly [name] 01/12/2024, 2:39 PM

## 2024-01-12 NOTE — Plan of Care (Signed)
  Problem: Education: Goal: Emotional status will improve Outcome: Progressing   Problem: Education: Goal: Knowledge of [facility] Education information/materials will improve Outcome: Progressing   Problem: Education: Goal: Mental status will improve Outcome: Progressing

## 2024-01-12 NOTE — Plan of Care (Signed)
  Problem: Education: Goal: Knowledge of [facility] Education information/materials will improve Outcome: Progressing Goal: Mental status will improve Outcome: Progressing   Problem: Coping: Goal: Ability to verbalize frustrations and anger appropriately will improve Outcome: Progressing   Problem: Health Behavior/Discharge Planning: Goal: Identification of resources available to assist in meeting health care needs will improve Outcome: Progressing

## 2024-01-12 NOTE — Group Note (Signed)
 Date:  01/12/2024 Time:  11:18 AM  Group Topic/Focus:  Wellness Toolbox:   The focus of this group is to discuss various aspects of wellness, balancing those aspects and exploring ways to increase the ability to experience wellness.  Patients will create a wellness toolbox for use upon discharge.    Participation Level:  Minimal  Participation Quality:  Appropriate  Affect:  Appropriate  Cognitive:  Appropriate  Insight: Appropriate  Engagement in Group:  Engaged  Modes of Intervention:  Socialization  Additional Comments:    Deitra Clap Rashidi Loh 01/12/2024, 11:18 AM

## 2024-01-12 NOTE — Progress Notes (Signed)
   01/12/24 0900  Psych Admission Type (Psych Patients Only)  Admission Status Voluntary  Psychosocial Assessment  Patient Complaints Anxiety  Eye Contact Fair  Facial Expression Flat  Affect Sad  Speech Logical/coherent  Interaction Assertive  Motor Activity Slow  Appearance/Hygiene Improved  Behavior Characteristics Cooperative;Appropriate to situation  Mood Pleasant  Thought Process  Coherency WDL  Content WDL  Delusions None reported or observed  Perception WDL  Hallucination None reported or observed  Judgment Poor  Confusion None  Danger to Self  Current suicidal ideation? Denies  Danger to Others  Danger to Others None reported or observed

## 2024-01-12 NOTE — Group Note (Signed)
 LCSW Group Therapy Note   Group Date: 01/12/2024 Start Time: 1300 End Time: 1400   Type of Therapy and Topic:  Group Therapy: Boundaries  Participation Level:  Active  Description of Group: This group will address the use of boundaries in their personal lives. Patients will explore why boundaries are important, the difference between healthy and unhealthy boundaries, and negative and postive outcomes of different boundaries and will look at how boundaries can be crossed.  Patients will be encouraged to identify current boundaries in their own lives and identify what kind of boundary is being set. Facilitators will guide patients in utilizing problem-solving interventions to address and correct types boundaries being used and to address when no boundary is being used. Understanding and applying boundaries will be explored and addressed for obtaining and maintaining a balanced life. Patients will be encouraged to explore ways to assertively make their boundaries and needs known to significant others in their lives, using other group members and facilitator for role play, support, and feedback.  Therapeutic Goals:  1.  Patient will identify areas in their life where setting clear boundaries could be  used to improve their life.  2.  Patient will identify signs/triggers that a boundary is not being respected. 3.  Patient will identify two ways to set boundaries in order to achieve balance in  their lives: 4.  Patient will demonstrate ability to communicate their needs and set boundaries  through discussion and/or role plays  Summary of Patient Progress:  Patient was present and active throughout the session and proved open to feedback from CSW and peers. Patient demonstrated proficient insight into the subject matter, was respectful of peers, and was present throughout the entire session.  Therapeutic Modalities:   Cognitive Behavioral Therapy Solution-Focused Therapy  Carly [name] 01/12/2024  2:19 PM

## 2024-01-13 DIAGNOSIS — F1094 Alcohol use, unspecified with alcohol-induced mood disorder: Secondary | ICD-10-CM

## 2024-01-13 MED ORDER — DM-GUAIFENESIN ER 30-600 MG PO TB12
1.0000 | ORAL_TABLET | Freq: Two times a day (BID) | ORAL | Status: DC | PRN
  Administered 2024-01-13 – 2024-01-14 (×3): 1 via ORAL
  Filled 2024-01-13 (×5): qty 1

## 2024-01-13 MED ORDER — BUDESON-GLYCOPYRROL-FORMOTEROL 160-9-4.8 MCG/ACT IN AERO
2.0000 | INHALATION_SPRAY | Freq: Two times a day (BID) | RESPIRATORY_TRACT | Status: DC
  Administered 2024-01-13 – 2024-01-15 (×4): 2 via RESPIRATORY_TRACT
  Filled 2024-01-13: qty 5.9

## 2024-01-13 MED ORDER — LACTASE 3000 UNITS PO TABS
3000.0000 [IU] | ORAL_TABLET | Freq: Three times a day (TID) | ORAL | Status: DC | PRN
  Filled 2024-01-13 (×3): qty 1

## 2024-01-13 NOTE — Progress Notes (Signed)
   01/12/24 2000  Psych Admission Type (Psych Patients Only)  Admission Status Voluntary  Psychosocial Assessment  Patient Complaints Anxiety  Eye Contact Fair  Facial Expression Flat  Affect Sad  Speech Logical/coherent  Interaction Assertive  Motor Activity Slow  Appearance/Hygiene Improved  Behavior Characteristics Cooperative;Appropriate to situation  Mood Pleasant  Thought Process  Coherency WDL  Content WDL  Delusions None reported or observed  Perception WDL  Hallucination None reported or observed  Judgment Poor  Confusion Severe  Danger to Self  Current suicidal ideation? Denies  Danger to Others  Danger to Others None reported or observed

## 2024-01-13 NOTE — Group Note (Signed)
 Date:  01/13/2024 Time:  11:30 AM  Group Topic/Focus:  Developing a Wellness Toolbox:   The focus of this group is to help patients develop a wellness toolbox with skills and strategies to promote recovery upon discharge.    Participation Level:  Active  Participation Quality:  Appropriate  Affect:  Appropriate  Cognitive:  Appropriate  Insight: Appropriate  Engagement in Group:  Engaged  Modes of Intervention:  Activity  Additional Comments:    Carly [name] 01/13/2024, 11:30 AM

## 2024-01-13 NOTE — Group Note (Signed)
 [facility] LCSW Group Therapy Note    Group Date: 01/13/2024 Start Time: 1300 End Time: 1400  Type of Therapy and Topic:  Group Therapy:  Overcoming Obstacles  Participation Level:  [facility] PARTICIPATION LEVEL: Active  Mood:  Description of Group:   In this group patients will be encouraged to explore what they see as obstacles to their own wellness and recovery. They will be guided to discuss their thoughts, feelings, and behaviors related to these obstacles. The group will process together ways to cope with barriers, with attention given to specific choices patients can make. Each patient will be challenged to identify changes they are motivated to make in order to overcome their obstacles. This group will be process-oriented, with patients participating in exploration of their own experiences as well as giving and receiving support and challenge from other group members.  Therapeutic Goals: 1. Patient will identify personal and current obstacles as they relate to admission. 2. Patient will identify barriers that currently interfere with their wellness or overcoming obstacles.  3. Patient will identify feelings, thought process and behaviors related to these barriers. 4. Patient will identify two changes they are willing to make to overcome these obstacles:    Summary of Patient Progress Patient was present in group.  Patient was an active participant.  Patient was supportive of others and provided and received appropriate feedback. Insight was fair.     Therapeutic Modalities:   Cognitive Behavioral Therapy Solution Focused Therapy Motivational Interviewing Relapse Prevention Therapy   Sherryle JINNY Margo, LCSW

## 2024-01-13 NOTE — Group Note (Signed)
 Date:  01/13/2024 Time:  8:27 PM  Group Topic/Focus:  Wellness Toolbox:   The focus of this group is to discuss various aspects of wellness, balancing those aspects and exploring ways to increase the ability to experience wellness.  Patients will create a wellness toolbox for use upon discharge.    Participation Level:  Active  Participation Quality:  Appropriate, Attentive, Sharing, and Supportive  Affect:  Appropriate  Cognitive:  Appropriate  Insight: Appropriate and Good  Engagement in Group:  Engaged  Modes of Intervention:  Discussion and Role-play  Additional Comments:     Kerri Katz 01/13/2024, 8:27 PM

## 2024-01-13 NOTE — Plan of Care (Signed)
  Problem: Education: Goal: Emotional status will improve Outcome: Progressing   Problem: Education: Goal: Knowledge of [facility] Education information/materials will improve Outcome: Progressing   Problem: Education: Goal: Mental status will improve Outcome: Progressing

## 2024-01-13 NOTE — Plan of Care (Signed)
   Problem: Education: Goal: Emotional status will improve Outcome: Progressing Goal: Mental status will improve Outcome: Progressing

## 2024-01-13 NOTE — Group Note (Signed)
 Recreation Therapy Group Note   Group Topic:Relaxation  Group Date: 01/13/2024 Start Time: 1530 End Time: 1610 Facilitators: Celestia Jeoffrey BRAVO, LRT, CTRS Location: Craft Room  Group Description: PMR (Progressive Muscle Relaxation). LRT educates patients on what PMR is and the benefits that come from it. Patients are asked to sit with their feet flat on the floor while sitting up and all the way back in their chair, if possible. LRT and pts follow a prompt through a speaker that requires you to tense and release different muscles in their body and focus on their breathing. During session, lights are off and soft music is being played.   Goal Area(s) Addressed:  Patients will be able to describe progressive muscle relaxation.  Patient will practice using relaxation technique. Patient will identify a new coping skill.  Patient will follow multistep directions to reduce anxiety and stress.  Affect/Mood: Appropriate   Participation Level: Active   Participation Quality: Independent   Behavior: Appropriate   Speech/Thought Process: Coherent   Insight: Good   Judgement: Good   Modes of Intervention: Activity   Patient Response to Interventions:  Receptive   Education Outcome:  Acknowledges education   Clinical Observations/Individualized Feedback: Telesia was active in their participation of session activities and group discussion. Pt interacted well with LRT and peers duration of session.    Plan: Continue to engage patient in RT group sessions 2-3x/week.   Jeoffrey BRAVO Celestia, LRT, CTRS 01/13/2024 5:17 PM

## 2024-01-13 NOTE — BH IP Treatment Plan (Signed)
 Interdisciplinary Treatment and Diagnostic Plan Update  01/13/2024 Time of Session: 10:18 AM Carly [name] MRN: [id number]  Principal Diagnosis: Alcohol-induced mood disorder with depressive symptoms (HCC)  Secondary Diagnoses: Principal Problem:   Alcohol-induced mood disorder with depressive symptoms (HCC) Active Problems:   Bipolar 2 disorder (HCC)   Current Medications:  Current Facility-Administered Medications  Medication Dose Route Frequency Provider Last Rate Last Admin   albuterol  (PROVENTIL ) (2.5 MG/3ML) 0.083% nebulizer solution 2.5 mg  2.5 mg Nebulization Q6H PRN Mills, Shnese E, NP   2.5 mg at 01/13/24 0636   alum & mag hydroxide-simeth (MAALOX/MYLANTA) 200-200-20 MG/5ML suspension 30 mL  30 mL Oral Q4H PRN Mills, Shnese E, NP       ARIPiprazole  (ABILIFY ) tablet 5 mg  5 mg Oral Daily Mills, Shnese E, NP   5 mg at 01/13/24 0802   dextromethorphan -guaiFENesin  (MUCINEX  DM) 30-600 MG per 12 hr tablet 1 tablet  1 tablet Oral BID PRN Bobbitt, Shalon E, NP   1 tablet at 01/13/24 0901   folic acid  (FOLVITE ) tablet 1 mg  1 mg Oral Daily Mills, Shnese E, NP   1 mg at 01/13/24 0802   ibuprofen  (ADVIL ) tablet 400 mg  400 mg Oral TID PRN Mills, Shnese E, NP   400 mg at 01/12/24 0836   lamoTRIgine  (LAMICTAL ) tablet 25 mg  25 mg Oral Daily Cleotilde Hoy CHRISTELLA, NP   25 mg at 01/13/24 0802   loperamide  (IMODIUM ) capsule 2 mg  2 mg Oral PRN Cleotilde Hoy CHRISTELLA, NP   2 mg at 01/12/24 2039   LORazepam  (ATIVAN ) tablet 1-4 mg  1-4 mg Oral Q1H PRN Mills, Shnese E, NP   2 mg at 01/11/24 2123   metoprolol  succinate (TOPROL -XL) 24 hr tablet 100 mg  100 mg Oral Daily Mills, Shnese E, NP   100 mg at 01/13/24 9196   multivitamin with minerals tablet 1 tablet  1 tablet Oral Daily Mills, Shnese E, NP   1 tablet at 01/13/24 0802   nicotine  (NICODERM CQ  - dosed in mg/24 hours) patch 21 mg  21 mg Transdermal Daily Jadapalle, Sree, MD   21 mg at 01/13/24 9195   nicotine  polacrilex (NICORETTE ) gum 2 mg  2 mg  Oral PRN Jadapalle, Sree, MD   2 mg at 01/11/24 1558   OLANZapine (ZYPREXA) injection 10 mg  10 mg Intramuscular TID PRN Mills, Shnese E, NP       OLANZapine (ZYPREXA) injection 5 mg  5 mg Intramuscular TID PRN Mills, Shnese E, NP       OLANZapine zydis (ZYPREXA) disintegrating tablet 5 mg  5 mg Oral TID PRN Mills, Shnese E, NP       pantoprazole  (PROTONIX ) EC tablet 40 mg  40 mg Oral Daily Mills, Shnese E, NP   40 mg at 01/13/24 0802   propranolol (INDERAL) tablet 10 mg  10 mg Oral TID PRN Cleotilde Hoy CHRISTELLA, NP   10 mg at 01/13/24 9196   thiamine (VITAMIN B1) tablet 100 mg  100 mg Oral Daily Mills, Shnese E, NP   100 mg at 01/13/24 9197   Or   thiamine (VITAMIN B1) injection 100 mg  100 mg Intravenous Daily Mills, Shnese E, NP       traZODone  (DESYREL ) tablet 50 mg  50 mg Oral QHS Cleotilde Hoy CHRISTELLA, NP   50 mg at 01/12/24 2114   PTA Medications: Medications Prior to Admission  Medication Sig Dispense Refill Last Dose/Taking   albuterol  (PROVENTIL ) (2.5 MG/3ML)  0.083% nebulizer solution Take 3 mLs (2.5 mg total) by nebulization every 6 (six) hours as needed for wheezing or shortness of breath. 150 mL 1 Past Month   albuterol  (VENTOLIN  HFA) 108 (90 Base) MCG/ACT inhaler INHALE 2 PUFFS INTO THE LUNGS EVERY 6 HOURS AS NEEDED FOR WHEEZING OR SHORTNESS OF BREATH (COUGH) 6.7 each 3 Past Month   BREZTRI  AEROSPHERE 160-9-4.8 MCG/ACT AERO inhaler INHALE 2 PUFFS INTO THE LUNGS IN THE MORNING AND AT BEDTIME. 10.7 each 5 01/12/2024 Morning   cyanocobalamin  (VITAMIN B12) 1000 MCG/ML injection INJECT 1 ML (1,000 MCG TOTAL) INTO THE MUSCLE EVERY 30 DAYS. 1 mL 15 Past Month   D3-50 1.25 MG (50000 UT) capsule Take 50,000 Units by mouth once a week.   Past Week   escitalopram  (LEXAPRO ) 20 MG tablet Take 1 tablet (20 mg total) by mouth daily.   01/11/2024   lamoTRIgine  (LAMICTAL ) 100 MG tablet Take 100 mg by mouth daily.   01/11/2024   metoprolol  succinate (TOPROL -XL) 100 MG 24 hr tablet TAKE 1 TABLET BY MOUTH  DAILY. TAKE WITH OR IMMEDIATELY FOLLOWING A MEAL. 30 tablet 2 01/11/2024   omeprazole  (PRILOSEC) 20 MG capsule TAKE 1 CAPSULE BY MOUTH EVERY DAY 90 capsule 1 01/11/2024   traZODone  (DESYREL ) 50 MG tablet TAKE 0.5-1 TABLETS BY MOUTH AT BEDTIME AS NEEDED FOR SLEEP.   01/11/2024   acetaminophen  (TYLENOL ) 500 MG tablet Take 500-1,000 mg by mouth every 6 (six) hours as needed for mild pain or headache.   Unknown   benzonatate  (TESSALON ) 100 MG capsule Take 2 capsules (200 mg total) by mouth 3 (three) times daily as needed. 20 capsule 0 More than a month    Patient Stressors: Financial difficulties   Health problems    Patient Strengths: Average or above average intelligence  Capable of independent living  Warehouse manager fund of knowledge  Supportive family/friends  Work skills   Treatment Modalities: Medication Management, Group therapy, Case management,  1 to 1 session with clinician, Psychoeducation, Recreational therapy.   Physician Treatment Plan for Primary Diagnosis: Alcohol-induced mood disorder with depressive symptoms (HCC) Long Term Goal(s): Improvement in symptoms so as ready for discharge   Short Term Goals: Ability to identify changes in lifestyle to reduce recurrence of condition will improve  Medication Management: Evaluate patient's response, side effects, and tolerance of medication regimen.  Therapeutic Interventions: 1 to 1 sessions, Unit Group sessions and Medication administration.  Evaluation of Outcomes: Not Met  Physician Treatment Plan for Secondary Diagnosis: Principal Problem:   Alcohol-induced mood disorder with depressive symptoms (HCC) Active Problems:   Bipolar 2 disorder (HCC)  Long Term Goal(s): Improvement in symptoms so as ready for discharge   Short Term Goals: Ability to identify changes in lifestyle to reduce recurrence of condition will improve     Medication Management: Evaluate patient's response, side effects, and tolerance of  medication regimen.  Therapeutic Interventions: 1 to 1 sessions, Unit Group sessions and Medication administration.  Evaluation of Outcomes: Not Met   RN Treatment Plan for Primary Diagnosis: Alcohol-induced mood disorder with depressive symptoms (HCC) Long Term Goal(s): Knowledge of disease and therapeutic regimen to maintain health will improve  Short Term Goals: Ability to verbalize frustration and anger appropriately will improve, Ability to demonstrate self-control, Ability to participate in decision making will improve, Ability to verbalize feelings will improve, Ability to disclose and discuss suicidal ideas, and Ability to identify and develop effective coping behaviors will improve  Medication Management: RN will administer medications  as ordered by provider, will assess and evaluate patient's response and provide education to patient for prescribed medication. RN will report any adverse and/or side effects to prescribing provider.  Therapeutic Interventions: 1 on 1 counseling sessions, Psychoeducation, Medication administration, Evaluate responses to treatment, Monitor vital signs and CBGs as ordered, Perform/monitor CIWA, COWS, AIMS and Fall Risk screenings as ordered, Perform wound care treatments as ordered.  Evaluation of Outcomes: Not Met   LCSW Treatment Plan for Primary Diagnosis: Alcohol-induced mood disorder with depressive symptoms (HCC) Long Term Goal(s): Safe transition to appropriate next level of care at discharge, Engage patient in therapeutic group addressing interpersonal concerns.  Short Term Goals: Engage patient in aftercare planning with referrals and resources, Increase social support, Increase ability to appropriately verbalize feelings, Increase emotional regulation, Facilitate acceptance of mental health diagnosis and concerns, Facilitate patient progression through stages of change regarding substance use diagnoses and concerns, Identify triggers associated  with mental health/substance abuse issues, and Increase skills for wellness and recovery  Therapeutic Interventions: Assess for all discharge needs, 1 to 1 time with Social worker, Explore available resources and support systems, Assess for adequacy in community support network, Educate family and significant other(s) on suicide prevention, Complete Psychosocial Assessment, Interpersonal group therapy.  Evaluation of Outcomes: Not Met   Progress in Treatment: Attending groups: Yes. Participating in groups: Yes. Taking medication as prescribed: Yes. Toleration medication: Yes. Family/Significant other contact made: Yes, individual(s) contacted:  Anyjah Roundtree, [phone], Husband, has been identified by the patient as the family member/significant other with whom the patient will be residing, and identified as the person(s) who will aid the patient in the event of a mental health crisis (suicidal ideations/suicide attempt).  With written consent from the patient, the family member/significant other has been provided the following suicide prevention education, prior to the and/or following the discharge of the patient. Patient understands diagnosis: Yes. Discussing patient identified problems/goals with staff: Yes. Medical problems stabilized or resolved: Yes. Denies suicidal/homicidal ideation: Yes. Issues/concerns per patient self-inventory: No. Other: None  New problem(s) identified: No, Describe:  None  New Short Term/Long Term Goal(s):detox, elimination of symptoms of psychosis, medication management for mood stabilization; elimination of SI thoughts; development of comprehensive mental wellness/sobriety plan.    Patient Goals:  Getting off the alcohol and starting to feel better.  Discharge Plan or Barriers: CSW to assist with the development of appropriate discharge plan.    Reason for Continuation of Hospitalization: Anxiety Depression Suicidal ideation Withdrawal  symptoms  Estimated Length of Stay: 1-7 days.   Last 3 [place] Suicide Severity Risk Score: Flowsheet Row Admission (Current) from 01/11/2024 in [facility] INPATIENT BEHAV[facility]AL MEDICINE ED from 01/10/2024 in [facility] Emergency Department [facility] ED to Hosp-Admission (Discharged) from 11/21/2023 in [facility] 1C MEDICAL TELEMETRY  C-SSRS RISK CATEGORY Low Risk High Risk No Risk    Last PHQ 2/9 Scores:    08/20/2023    3:54 PM 04/08/2023   11:05 AM 03/20/2023    1:37 PM  Depression screen PHQ 2/9  Decreased Interest 0 0 0  Down, Depressed, Hopeless 0 0 0  PHQ - 2 Score 0 0 0  Altered sleeping 0 0 0  Tired, decreased energy 0 3 2  Change in appetite 0 0 0  Feeling bad or failure about yourself  0 0 0  Trouble concentrating 0 0 0  Moving slowly or fidgety/restless 0 0 0  Suicidal thoughts 0 0 0  PHQ-9 Score 0 3 2  Difficult  doing work/chores Not difficult at all Not difficult at all Not difficult at all    Scribe for Treatment Team: Levaughn Puccinelli M Tiffanee Mcnee, LCSW 01/13/2024 10:44 AM

## 2024-01-13 NOTE — Group Note (Signed)
 Recreation Therapy Group Note   Group Topic:Coping Skills  Group Date: 01/13/2024 Start Time: 1045 End Time: 1130 Facilitators: Celestia Jeoffrey BRAVO, LRT, CTRS Location: Craft Room   Group Description: Mind Map.  Patient was provided a blank template of a diagram with 32 blank boxes in a tiered system, branching from the center (similar to a bubble chart). LRT directed patients to label the middle of the diagram Coping Skills. LRT and patients then came up with 8 different coping skills as examples. Pt were directed to record their coping skills in the 2nd tier boxes closest to the center.  Patients would then share their coping skills with the group as LRT wrote them out. LRT gave a handout of 99 different coping skills at the end of group.   Goal Area(s) Addressed: Patients will be able to define "coping skills". Patient will identify new coping skills.  Patient will increase communication.  Affect/Mood: Appropriate   Participation Level: Active and Engaged   Participation Quality: Independent   Behavior: Calm and Cooperative   Speech/Thought Process: Coherent   Insight: Good   Judgement: Good   Modes of Intervention: Clarification, Education, Worksheet, and Writing   Patient Response to Interventions:  Attentive, Engaged, Interested , and Receptive   Education Outcome:  Acknowledges education   Clinical Observations/Individualized Feedback: Carly [name] was active in their participation of session activities and group discussion. Pt identified puzzles and crocheting as coping skills. Pt interacted well with LRT and peers duration of session.    Plan: Continue to engage patient in RT group sessions 2-3x/week.   [address], LRT, CTRS 01/13/2024 1:34 PM

## 2024-01-13 NOTE — Progress Notes (Signed)
 [facility] MD Progress Note  01/13/2024 3:56 PM Carly [name]  MRN:  [id number]  Patient is a 53 year old female with past medical history of COPD and emphysema, and past psychiatric history of bipolar II disorder without psychotic features and generalized anxiety disorder with panic episodes, who presented to the emergency department with acute emotional distress and reported excessive alcohol consumption in response to recent life stressors.   Patient presented to the emergency department reportedly "losing it," citing increased anxiety and alcohol use. She endorsed drinking approximately one pint of liquor daily for several weeks. She was found intoxicated with a blood alcohol level of 397 and initially expressed vague thoughts of wanting to die, which she now denies, attributing the statements to alcohol intoxication.  Subjective:  Chart reviewed, case discussed in multidisciplinary meeting, patient seen during rounds.   On follow-up patient is found engaging with peers.  Affect is bright they are engaging with peers appropriately.  On interview patient is linear logical and future oriented.  They deny SI, HI, and AVH.  They deny adverse effects of medications.  They report they are sleeping well.  They note stable appetite.  They demonstrate good insight into the need for outpatient follow-up and medication compliance.  They did note they see Jolene Farrell through talkiatry and have an appointment either [phone] or [phone] and will verify.  They request to be discharged on Wednesday.  The plan is to go home at discharge.  She reports depression and anxiety at 1 out of 10 today.  She voices no concerns or complaints.  She utilized as needed propranolol albuterol  and Imodium  overnight.  Most recent CIWA 0,0,0,1   Sleep: Good  Appetite:  Good  Past Psychiatric History: see h&P Family History:  Family History  Problem Relation Age of Onset   Hyperlipidemia Mother    Heart disease Mother    Stroke Mother     Depression Mother    Mental illness Mother    Heart attack Mother 107   Hyperlipidemia Father    Depression Father    Mental illness Father    Diabetes Paternal Grandfather    Cancer Cousin    Social History:  Social History   Substance and Sexual Activity  Alcohol Use Yes   Alcohol/week: 8.0 standard drinks of alcohol   Types: 8 Standard drinks or equivalent per week   Comment: Beer (Can)      Social History   Substance and Sexual Activity  Drug Use No   Comment: CBD oil    Social History   Socioeconomic History   Marital status: Married    Spouse name: Not on file   Number of children: Not on file   Years of education: Not on file   Highest education level: Not on file  Occupational History   Not on file  Tobacco Use   Smoking status: Every Day    Current packs/day: 1.50    Average packs/day: 1.5 packs/day for 28.0 years (42.0 ttl pk-yrs)    Types: Cigarettes   Smokeless tobacco: Never   Tobacco comments:    1 PPD khj 07/03/2023  Vaping Use   Vaping status: Every Day  Substance and Sexual Activity   Alcohol use: Yes    Alcohol/week: 8.0 standard drinks of alcohol    Types: 8 Standard drinks or equivalent per week    Comment: Beer (Can)    Drug use: No    Comment: CBD oil   Sexual activity: Yes    Partners:  Male  Other Topics Concern   Not on file  Social History Narrative   1 Year college    Works in a Warehouse- [employer]   Married to [place]    Enjoys coloring    Pets: Dog lives inside   Children: 28 Son 69 Son   Social Drivers of Corporate investment banker Strain: Not on file  Food Insecurity: No Food Insecurity (01/11/2024)   Hunger Vital Sign    Worried About Running Out of Food in the Last Year: Never true    Ran Out of Food in the Last Year: Never true  Transportation Needs: No Transportation Needs (01/11/2024)   PRAPARE - Administrator, Civil Service (Medical): No    Lack of Transportation (Non-Medical): No   Physical Activity: Not on file  Stress: Not on file  Social Connections: Moderately Isolated (08/07/2023)   Social Connection and Isolation Panel    Frequency of Communication with Friends and Family: More than three times a week    Frequency of Social Gatherings with Friends and Family: More than three times a week    Attends Religious Services: Never    Database administrator or Organizations: No    Attends Engineer, structural: Never    Marital Status: Married   Past Medical History:  Past Medical History:  Diagnosis Date   Abnormal uterine bleeding (AUB) 08/26/2018   Anxiety    C. difficile diarrhea    07/04/21   Chicken pox    Depression    Depression, major, single episode, moderate (HCC) 08/04/2018   Diarrhea 06/07/2022   Dyspnea on exertion 05/24/2022   Elevated testosterone  level in female    Emphysema of lung (HCC)    bronchitis   Frequent headaches    Generalized anxiety disorder 08/23/2014   Hay fever    Hypertension    Idiopathic thrombocytopenic purpura (ITP) (HCC)    Iron deficiency 09/02/2017   Positive ANA (antinuclear antibody)    Thrombocytopenia (HCC) 02/12/2016    Past Surgical History:  Procedure Laterality Date   HEMATOMA EVACUATION N/A 04/07/2020   Procedure: EVACUATION HEMATOMA  AND APPLICATION OF PRESSURE DRESSING;  Surgeon: Verdon Keen, MD;  Location: [facility] ORS;  Service: Gynecology;  Laterality: N/A;   HYSTEROSCOPY WITH NOVASURE N/A 02/21/2018   Procedure: HYSTEROSCOPY WITH NOVASURE, POLYPECTOMY;  Surgeon: Verdon Keen, MD;  Location: [facility] ORS;  Service: Gynecology;  Laterality: N/A;   TUBAL LIGATION  1997   TUBAL LIGATION      Current Medications: Current Facility-Administered Medications  Medication Dose Route Frequency Provider Last Rate Last Admin   albuterol  (PROVENTIL ) (2.5 MG/3ML) 0.083% nebulizer solution 2.5 mg  2.5 mg Nebulization Q6H PRN Mills, Shnese E, NP   2.5 mg at 01/13/24 0636   alum & mag hydroxide-simeth  (MAALOX/MYLANTA) 200-200-20 MG/5ML suspension 30 mL  30 mL Oral Q4H PRN Mills, Shnese E, NP       ARIPiprazole  (ABILIFY ) tablet 5 mg  5 mg Oral Daily Mills, Shnese E, NP   5 mg at 01/13/24 0802   dextromethorphan -guaiFENesin  (MUCINEX  DM) 30-600 MG per 12 hr tablet 1 tablet  1 tablet Oral BID PRN Bobbitt, Shalon E, NP   1 tablet at 01/13/24 0901   folic acid  (FOLVITE ) tablet 1 mg  1 mg Oral Daily Mills, Shnese E, NP   1 mg at 01/13/24 9197   ibuprofen  (ADVIL ) tablet 400 mg  400 mg Oral TID PRN Mills, Shnese E, NP  400 mg at 01/12/24 0836   lamoTRIgine  (LAMICTAL ) tablet 25 mg  25 mg Oral Daily Cleotilde Hoy HERO, NP   25 mg at 01/13/24 0802   loperamide  (IMODIUM ) capsule 2 mg  2 mg Oral PRN Cleotilde Hoy HERO, NP   2 mg at 01/13/24 1050   LORazepam  (ATIVAN ) tablet 1-4 mg  1-4 mg Oral Q1H PRN Mills, Shnese E, NP   2 mg at 01/11/24 2123   metoprolol  succinate (TOPROL -XL) 24 hr tablet 100 mg  100 mg Oral Daily Mills, Shnese E, NP   100 mg at 01/13/24 9196   multivitamin with minerals tablet 1 tablet  1 tablet Oral Daily Mills, Shnese E, NP   1 tablet at 01/13/24 0802   nicotine  (NICODERM CQ  - dosed in mg/24 hours) patch 21 mg  21 mg Transdermal Daily Jadapalle, Sree, MD   21 mg at 01/13/24 0804   nicotine  polacrilex (NICORETTE ) gum 2 mg  2 mg Oral PRN Jadapalle, Sree, MD   2 mg at 01/11/24 1558   OLANZapine (ZYPREXA) injection 10 mg  10 mg Intramuscular TID PRN Mills, Shnese E, NP       OLANZapine (ZYPREXA) injection 5 mg  5 mg Intramuscular TID PRN Mills, Shnese E, NP       OLANZapine zydis (ZYPREXA) disintegrating tablet 5 mg  5 mg Oral TID PRN Mills, Shnese E, NP       pantoprazole  (PROTONIX ) EC tablet 40 mg  40 mg Oral Daily Mills, Shnese E, NP   40 mg at 01/13/24 0802   propranolol (INDERAL) tablet 10 mg  10 mg Oral TID PRN Cleotilde Hoy HERO, NP   10 mg at 01/13/24 9196   thiamine (VITAMIN B1) tablet 100 mg  100 mg Oral Daily Mills, Shnese E, NP   100 mg at 01/13/24 9197   Or   thiamine (VITAMIN  B1) injection 100 mg  100 mg Intravenous Daily Mills, Shnese E, NP       traZODone  (DESYREL ) tablet 50 mg  50 mg Oral QHS Cleotilde Hoy HERO, NP   50 mg at 01/12/24 2114    Lab Results: No results found for this or any previous visit (from the past 48 hours).  Blood Alcohol level:  Lab Results  Component Value Date   ETH 397 [facility]) 01/10/2024    Metabolic Disorder Labs: Lab Results  Component Value Date   HGBA1C 5.4 11/06/2022   MPG 117 09/09/2022   Lab Results  Component Value Date   PROLACTIN 4.4 09/23/2014   Lab Results  Component Value Date   CHOL 170 11/06/2022   TRIG 101.0 11/06/2022   HDL 46.20 11/06/2022   CHOLHDL 4 11/06/2022   VLDL 20.2 11/06/2022   LDLCALC 103 (H) 11/06/2022   LDLCALC 140 (H) 06/10/2019    Physical Findings: AIMS:  , ,  ,  ,    CIWA:  CIWA-Ar Total: 0 COWS:      Psychiatric Specialty Exam:  Presentation  General Appearance:  Fairly Groomed  Eye Contact: Good  Speech: Normal Rate  Speech Volume: Normal    Mood and Affect  Mood: Euthymic  Affect: Flat   Thought Process  Thought Processes: Goal Directed  Descriptions of Associations:Intact  Orientation:Full (Time, Place and Person)  Thought Content:Logical  Hallucinations:Hallucinations: None  Ideas of Reference:None  Suicidal Thoughts:Suicidal Thoughts: No  Homicidal Thoughts:Homicidal Thoughts: No   Sensorium  Memory:No data recorded Judgment: Fair  Insight: Fair   Art therapist  Concentration: Good  Attention Span:  Good  Recall: Good  Fund of Knowledge: Good  Language: Good   Psychomotor Activity  Psychomotor Activity: Psychomotor Activity: Normal  Musculoskeletal: Strength & Muscle Tone: within normal limits Gait & Station: normal Assets  Assets: Housing; Intimacy; Resilience; Social Support; Manufacturing systems engineer; Vocational/Educational    Physical Exam: Physical Exam Vitals and nursing note reviewed.  HENT:      Head: Atraumatic.   Eyes:     Extraocular Movements: Extraocular movements intact.   Pulmonary:     Effort: Pulmonary effort is normal.   Neurological:     Mental Status: She is alert and oriented to person, place, and time.    Review of Systems  Neurological:  Negative for tremors.  Psychiatric/Behavioral:  Positive for depression. Negative for hallucinations, memory loss, substance abuse and suicidal ideas. The patient is nervous/anxious. The patient does not have insomnia.    Blood pressure 125/70, pulse (!) 101, temperature 97.9 F (36.6 C), resp. rate (!) 21, height 5' 2 (1.575 m), weight 99.8 kg, SpO2 91%. Body mass index is 40.24 kg/m.  Diagnosis: Principal Problem:   Alcohol-induced mood disorder with depressive symptoms (HCC) Active Problems:   Bipolar 2 disorder (HCC)   PLAN: Safety and Monitoring:  -- Voluntary admission to inpatient psychiatric unit for safety, stabilization and treatment  -- Daily contact with patient to assess and evaluate symptoms and progress in treatment  -- Patient's case to be discussed in multi-disciplinary team meeting  -- Observation Level : q15 minute checks  -- Vital signs:  q12 hours  -- Precautions: suicide, elopement, and assault -- Encouraged patient to participate in unit milieu and in scheduled group therapies  2. Psychiatric Diagnoses and Treatment:      Psychiatric Diagnosis Alcohol Use Disorder, severe  Bipolar II Disorder  Generalized Anxiety Disorder with panic episodes   Psychiatric Medications Escitalopram  20 mg PO daily - continue Lamotrigine  25 mg PO daily - restart due to missed doses; plan to titrate Aripiprazole  5 mg PO daily - continue (per ED adjustment) Propranolol 10 mg PO TID PRN - start for somatic anxiety (palpitations, tremors) CIWA monitoring per protocol with prn Ativan    -- The risks/benefits/side-effects/alternatives to this medication were discussed in detail with the patient and time was  given for questions. The patient consents to medication trial.                -- Metabolic profile and EKG monitoring obtained while on an atypical antipsychotic (BMI: Lipid Panel: HbgA1c: QTc:)              -- Encouraged patient to participate in unit milieu and in scheduled group therapies    3. Medical Issues Being Addressed:    Monitor for any COPD/emphysema exacerbations during hospitalization   4. Discharge Planning:   -- Social work and case management to assist with discharge planning and identification of hospital follow-up needs prior to discharge  -- Estimated LOS: 3-4 days  Donnice FORBES Right, PA-C 01/13/2024, 3:56 PM

## 2024-01-13 NOTE — Progress Notes (Signed)
   01/13/24 0900  Psych Admission Type (Psych Patients Only)  Admission Status Voluntary  Psychosocial Assessment  Patient Complaints Anxiety  Eye Contact Fair  Facial Expression Other (Comment) (WNL)  Affect Anxious  Speech Logical/coherent  Interaction Assertive  Motor Activity Slow  Appearance/Hygiene Body odor  Behavior Characteristics Cooperative;Appropriate to situation  Mood Pleasant  Thought Process  Coherency WDL  Content WDL  Delusions None reported or observed  Perception WDL  Hallucination None reported or observed  Judgment Poor  Confusion None  Danger to Self  Current suicidal ideation? Denies  Danger to Others  Danger to Others None reported or observed

## 2024-01-14 DIAGNOSIS — F1094 Alcohol use, unspecified with alcohol-induced mood disorder: Secondary | ICD-10-CM | POA: Diagnosis not present

## 2024-01-14 LAB — LIPID PANEL
Cholesterol: 170 mg/dL (ref 0–200)
HDL: 34 mg/dL — ABNORMAL LOW
LDL Cholesterol: 118 mg/dL — ABNORMAL HIGH (ref 0–99)
Total CHOL/HDL Ratio: 5 ratio
Triglycerides: 91 mg/dL
VLDL: 18 mg/dL (ref 0–40)

## 2024-01-14 LAB — HEMOGLOBIN A1C
Hgb A1c MFr Bld: 5.4 % (ref 4.8–5.6)
Mean Plasma Glucose: 108.28 mg/dL

## 2024-01-14 MED ORDER — HYDROXYZINE HCL 25 MG PO TABS
25.0000 mg | ORAL_TABLET | Freq: Three times a day (TID) | ORAL | Status: DC | PRN
  Administered 2024-01-14 (×2): 25 mg via ORAL
  Filled 2024-01-14 (×2): qty 1

## 2024-01-14 NOTE — Plan of Care (Signed)
   Problem: Education: Goal: Emotional status will improve Outcome: Progressing Goal: Mental status will improve Outcome: Progressing

## 2024-01-14 NOTE — Group Note (Signed)
 Date:  01/14/2024 Time:  10:54 AM  Group Topic/Focus:  Making Healthy Choices:   The focus of this group is to help patients identify negative/unhealthy choices they were using prior to admission and identify positive/healthier coping strategies to replace them upon discharge.    Participation Level:  Active  Participation Quality:  Appropriate  Affect:  Appropriate  Cognitive:  Appropriate  Insight: Appropriate  Engagement in Group:  Engaged  Modes of Intervention:  Activity and Socialization  Additional Comments:    Deitra Caron [place] 01/14/2024, 10:54 AM

## 2024-01-14 NOTE — Progress Notes (Signed)
 [facility] MD Progress Note  01/14/2024 10:02 AM Carly [name]  MRN:  [id number]  Patient is a 53 year old female with past medical history of COPD and emphysema, and past psychiatric history of bipolar II disorder without psychotic features and generalized anxiety disorder with panic episodes, who presented to the emergency department with acute emotional distress and reported excessive alcohol consumption in response to recent life stressors.   Patient presented to the emergency department reportedly "losing it," citing increased anxiety and alcohol use. She endorsed drinking approximately one pint of liquor daily for several weeks. She was found intoxicated with a blood alcohol level of 397 and initially expressed vague thoughts of wanting to die, which she now denies, attributing the statements to alcohol intoxication.  Subjective:  Chart reviewed, case discussed in multidisciplinary meeting, patient seen during rounds.    7/1: On follow-up patient presents with bright affect.  They are linear logical and future oriented.  They deny SI, HI, and AVH.  Denies symptoms of withdrawal.  They report they spoke with her husband he is doing well they feel positive about their relationship.  They report they have a lot of grandchildren and discussed that everyone is doing well and that they are expecting new twin babies from one of their children.  They were able to confirm that they have a psychiatric follow-up appointment on July 23.  Social work is working on getting him therapy follow-up.  They plan to attend AA at discharge.  They deny any adverse effects of medications.  They deny ongoing depression and anxiety.  They are agreeable with plan to DC tomorrow.    6/30On follow-up patient is found engaging with peers.  Affect is bright they are engaging with peers appropriately.  On interview patient is linear logical and future oriented.  They deny SI, HI, and AVH.  They deny adverse effects of medications.   They report they are sleeping well.  They note stable appetite.  They demonstrate good insight into the need for outpatient follow-up and medication compliance.  They did note they see Jolene Farrell through talkiatry and have an appointment either [phone] or [phone] and will verify.  They request to be discharged on Wednesday.  The plan is to go home at discharge.  She reports depression and anxiety at 1 out of 10 today.  She voices no concerns or complaints.  She utilized as needed propranolol albuterol  and Imodium  overnight.  Most recent CIWA 0,0,0,1   Sleep: Good  Appetite:  Good  Past Psychiatric History: see h&P Family History:  Family History  Problem Relation Age of Onset   Hyperlipidemia Mother    Heart disease Mother    Stroke Mother    Depression Mother    Mental illness Mother    Heart attack Mother 67   Hyperlipidemia Father    Depression Father    Mental illness Father    Diabetes Paternal Grandfather    Cancer Cousin    Social History:  Social History   Substance and Sexual Activity  Alcohol Use Yes   Alcohol/week: 8.0 standard drinks of alcohol   Types: 8 Standard drinks or equivalent per week   Comment: Beer (Can)      Social History   Substance and Sexual Activity  Drug Use No   Comment: CBD oil    Social History   Socioeconomic History   Marital status: Married    Spouse name: Not on file   Number of children: Not on file  Years of education: Not on file   Highest education level: Not on file  Occupational History   Not on file  Tobacco Use   Smoking status: Every Day    Current packs/day: 1.50    Average packs/day: 1.5 packs/day for 28.0 years (42.0 ttl pk-yrs)    Types: Cigarettes   Smokeless tobacco: Never   Tobacco comments:    1 PPD khj 07/03/2023  Vaping Use   Vaping status: Every Day  Substance and Sexual Activity   Alcohol use: Yes    Alcohol/week: 8.0 standard drinks of alcohol    Types: 8 Standard drinks or equivalent per week     Comment: Beer (Can)    Drug use: No    Comment: CBD oil   Sexual activity: Yes    Partners: Male  Other Topics Concern   Not on file  Social History Narrative   1 Year college    Works in a Warehouse- [employer]   Married to [place]    Enjoys coloring    Pets: Dog lives inside   Children: 13 Son 45 Son   Social Drivers of Corporate investment banker Strain: Not on file  Food Insecurity: No Food Insecurity (01/11/2024)   Hunger Vital Sign    Worried About Running Out of Food in the Last Year: Never true    Ran Out of Food in the Last Year: Never true  Transportation Needs: No Transportation Needs (01/11/2024)   PRAPARE - Administrator, Civil Service (Medical): No    Lack of Transportation (Non-Medical): No  Physical Activity: Not on file  Stress: Not on file  Social Connections: Moderately Isolated (08/07/2023)   Social Connection and Isolation Panel    Frequency of Communication with Friends and Family: More than three times a week    Frequency of Social Gatherings with Friends and Family: More than three times a week    Attends Religious Services: Never    Database administrator or Organizations: No    Attends Engineer, structural: Never    Marital Status: Married   Past Medical History:  Past Medical History:  Diagnosis Date   Abnormal uterine bleeding (AUB) 08/26/2018   Anxiety    C. difficile diarrhea    07/04/21   Chicken pox    Depression    Depression, major, single episode, moderate (HCC) 08/04/2018   Diarrhea 06/07/2022   Dyspnea on exertion 05/24/2022   Elevated testosterone  level in female    Emphysema of lung (HCC)    bronchitis   Frequent headaches    Generalized anxiety disorder 08/23/2014   Hay fever    Hypertension    Idiopathic thrombocytopenic purpura (ITP) (HCC)    Iron deficiency 09/02/2017   Positive ANA (antinuclear antibody)    Thrombocytopenia (HCC) 02/12/2016    Past Surgical History:  Procedure  Laterality Date   HEMATOMA EVACUATION N/A 04/07/2020   Procedure: EVACUATION HEMATOMA  AND APPLICATION OF PRESSURE DRESSING;  Surgeon: Verdon Keen, MD;  Location: [facility] ORS;  Service: Gynecology;  Laterality: N/A;   HYSTEROSCOPY WITH NOVASURE N/A 02/21/2018   Procedure: HYSTEROSCOPY WITH NOVASURE, POLYPECTOMY;  Surgeon: Verdon Keen, MD;  Location: [facility] OR[facility];  Service: Gynecology;  Laterality: N/A;   TUBAL LIGATION  1997   TUBAL LIGATION      Current Medications: Current Facility-Administered Medications  Medication Dose Route Frequency Provider Last Rate Last Admin   albuterol  (PROVENTIL ) (2.5 MG/3ML) 0.083% nebulizer solution 2.5 mg  2.5 mg Nebulization Q6H PRN Mills, Shnese E, NP   2.5 mg at 01/14/24 0701   alum & mag hydroxide-simeth (MAALOX/MYLANTA) 200-200-20 MG/5ML suspension 30 mL  30 mL Oral Q4H PRN Mills, Shnese E, NP       ARIPiprazole  (ABILIFY ) tablet 5 mg  5 mg Oral Daily Mills, Shnese E, NP   5 mg at 01/14/24 9196   budesonide -glycopyrrolate-formoterol  (BREZTRI ) 160-9-4.8 MCG/ACT inhaler 2 puff  2 puff Inhalation BID Omri Bertran E, PA-C   2 puff at 01/14/24 0805   dextromethorphan -guaiFENesin  (MUCINEX  DM) 30-600 MG per 12 hr tablet 1 tablet  1 tablet Oral BID PRN Bobbitt, Shalon E, NP   1 tablet at 01/13/24 2053   folic acid  (FOLVITE ) tablet 1 mg  1 mg Oral Daily Mills, Shnese E, NP   1 mg at 01/14/24 9195   ibuprofen  (ADVIL ) tablet 400 mg  400 mg Oral TID PRN Mills, Shnese E, NP   400 mg at 01/12/24 0836   lactase (LACTAID) tablet 3,000 Units  3,000 Units Oral TID WC PRN Rena Sweeden E, PA-C       lamoTRIgine  (LAMICTAL ) tablet 25 mg  25 mg Oral Daily Cleotilde Hoy HERO, NP   25 mg at 01/14/24 9195   loperamide  (IMODIUM ) capsule 2 mg  2 mg Oral PRN Cleotilde Hoy HERO, NP   2 mg at 01/13/24 1050   metoprolol  succinate (TOPROL -XL) 24 hr tablet 100 mg  100 mg Oral Daily Mills, Shnese E, NP   100 mg at 01/14/24 9196   multivitamin with minerals tablet 1 tablet  1  tablet Oral Daily Moishe Burton E, NP   1 tablet at 01/14/24 9196   nicotine  (NICODERM CQ  - dosed in mg/24 hours) patch 21 mg  21 mg Transdermal Daily Jadapalle, Sree, MD   21 mg at 01/14/24 9195   nicotine  polacrilex (NICORETTE ) gum 2 mg  2 mg Oral PRN Jadapalle, Sree, MD   2 mg at 01/11/24 1558   OLANZapine (ZYPREXA) injection 10 mg  10 mg Intramuscular TID PRN Mills, Shnese E, NP       OLANZapine (ZYPREXA) injection 5 mg  5 mg Intramuscular TID PRN Mills, Shnese E, NP       OLANZapine zydis (ZYPREXA) disintegrating tablet 5 mg  5 mg Oral TID PRN Mills, Shnese E, NP       pantoprazole  (PROTONIX ) EC tablet 40 mg  40 mg Oral Daily Mills, Shnese E, NP   40 mg at 01/14/24 0803   propranolol (INDERAL) tablet 10 mg  10 mg Oral TID PRN Cleotilde Hoy HERO, NP   10 mg at 01/13/24 2055   thiamine (VITAMIN B1) tablet 100 mg  100 mg Oral Daily Mills, Shnese E, NP   100 mg at 01/14/24 9196   Or   thiamine (VITAMIN B1) injection 100 mg  100 mg Intravenous Daily Mills, Shnese E, NP       traZODone  (DESYREL ) tablet 50 mg  50 mg Oral QHS Cleotilde Hoy HERO, NP   50 mg at 01/13/24 2054    Lab Results: No results found for this or any previous visit (from the past 48 hours).  Blood Alcohol level:  Lab Results  Component Value Date   ETH 397 [name]) 01/10/2024    Metabolic Disorder Labs: Lab Results  Component Value Date   HGBA1C 5.4 11/06/2022   MPG 117 09/09/2022   Lab Results  Component Value Date   PROLACTIN 4.4 09/23/2014   Lab Results  Component Value  Date   CHOL 170 11/06/2022   TRIG 101.0 11/06/2022   HDL 46.20 11/06/2022   CHOLHDL 4 11/06/2022   VLDL 20.2 11/06/2022   LDLCALC 103 (H) 11/06/2022   LDLCALC 140 (H) 06/10/2019    Physical Findings: AIMS:  , ,  ,  ,    CIWA:  CIWA-Ar Total: 0 COWS:      Psychiatric Specialty Exam:  Presentation  General Appearance:  Fairly Groomed  Eye Contact: Good  Speech: Normal Rate  Speech Volume: Normal    Mood and Affect   Mood: Euthymic  Affect: Flat   Thought Process  Thought Processes: Goal Directed  Descriptions of Associations:Intact  Orientation:Full (Time, Place and Person)  Thought Content:Logical  Hallucinations:No data recorded  Ideas of Reference:None  Suicidal Thoughts:No data recorded  Homicidal Thoughts:No data recorded   Sensorium  Memory:No data recorded Judgment: Fair  Insight: Fair   Art therapist  Concentration: Good  Attention Span: Good  Recall: Good  Fund of Knowledge: Good  Language: Good   Psychomotor Activity  Psychomotor Activity: No data recorded  Musculoskeletal: Strength & Muscle Tone: within normal limits Gait & Station: normal Assets  Assets: Housing; Intimacy; Resilience; Social Support; Manufacturing systems engineer; Vocational/Educational    Physical Exam: Physical Exam Vitals and nursing note reviewed.  HENT:     Head: Atraumatic.   Eyes:     Extraocular Movements: Extraocular movements intact.   Pulmonary:     Effort: Pulmonary effort is normal.   Neurological:     Mental Status: She is alert and oriented to person, place, and time.    Review of Systems  Neurological:  Negative for tremors.  Psychiatric/Behavioral:  Negative for depression, hallucinations, memory loss, substance abuse and suicidal ideas. The patient is not nervous/anxious and does not have insomnia.    Blood pressure (!) 145/82, pulse 91, temperature (!) 97.1 F (36.2 C), resp. rate 16, height 5' 2 (1.575 m), weight 99.8 kg, SpO2 92%. Body mass index is 40.24 kg/m.  Diagnosis: Principal Problem:   Alcohol-induced mood disorder with depressive symptoms (HCC) Active Problems:   Bipolar 2 disorder (HCC)   PLAN: Safety and Monitoring:  -- Voluntary admission to inpatient psychiatric unit for safety, stabilization and treatment  -- Daily contact with patient to assess and evaluate symptoms and progress in treatment  -- Patient's case to be  discussed in multi-disciplinary team meeting  -- Observation Level : q15 minute checks  -- Vital signs:  q12 hours  -- Precautions: suicide, elopement, and assault -- Encouraged patient to participate in unit milieu and in scheduled group therapies  2. Psychiatric Diagnoses and Treatment:  Doing well not withdrawal. Stable mood, appetite, and sleep. DC tomorrow.     Psychiatric Diagnosis Alcohol Use Disorder, severe  Bipolar II Disorder  Generalized Anxiety Disorder with panic episodes   Psychiatric Medications Escitalopram  20 mg PO daily - continue Lamotrigine  25 mg PO daily - continue Aripiprazole  5 mg PO daily - continue (per ED adjustment) Propranolol 10 mg PO TID PRN - d/c feels hydroxyzine works well Hydroxine 25 mg PO TID PRN - anxiety  -- The risks/benefits/side-effects/alternatives to this medication were discussed in detail with the patient and time was given for questions. The patient consents to medication trial.                -- Metabolic profile and EKG monitoring obtained while on an atypical antipsychotic (BMI: Lipid Panel: HbgA1c: QTc:)              --  Encouraged patient to participate in unit milieu and in scheduled group therapies    3. Medical Issues Being Addressed:    Monitor for any COPD/emphysema exacerbations during hospitalization   4. Discharge Planning:   -- Plan for d/c tomorrow  -- Social work and case management to assist with discharge planning and identification of [name] follow-up needs prior to discharge  -- Estimated LOS: 3-4 days  Donnice FORBES Right, PA-C 01/14/2024, 10:02 AM

## 2024-01-14 NOTE — Group Note (Signed)
 LCSW Group Therapy Note   Group Date: 01/14/2024 Start Time: 1300 End Time: 1400   Type of Therapy and Topic:  Group Therapy: Challenging Core Beliefs  Participation Level:  Active  Description of Group:  Patients were educated about core beliefs and asked to identify one harmful core belief that they have. Patients were asked to explore from where those beliefs originate. Patients were asked to discuss how those beliefs make them feel and the resulting behaviors of those beliefs. They were then be asked if those beliefs are true and, if so, what evidence they have to support them. Lastly, group members were challenged to replace those negative core beliefs with helpful beliefs.   Therapeutic Goals:   1. Patient will identify harmful core beliefs and explore the origins of such beliefs. 2. Patient will identify feelings and behaviors that result from those core beliefs. 3. Patient will discuss whether such beliefs are true. 4.  Patient will replace harmful core beliefs with helpful ones.  Summary of Patient Progress:  Patient actively engaged in processing and exploring how core beliefs are formed and how they impact thoughts, feelings, and behaviors. Patient proved open to input from peers and feedback from CSW. Patient demonstrated proficient insight into the subject matter, was respectful and supportive of peers, and participated throughout the entire session.  Therapeutic Modalities: Cognitive Behavioral Therapy; Solution-Focused Therapy   Carly [name], LCSWA 01/14/2024  2:45 PM

## 2024-01-14 NOTE — Group Note (Signed)
 Recreation Therapy Group Note   Group Topic:Stress Management  Group Date: 01/14/2024 Start Time: 1530 End Time: 1620 Facilitators: Celestia Jeoffrey FORBES ARTICE, CTRS Location: Craft Room  Group Description: Taboo. LRT and patients played the game Taboo. The object of the game is to have peers guess the word up at the top of the card drawn that is in bold, while being sure to not use any of the descriptive words down below it on the same card. If the person attempting to explain the word in bold uses any of the descriptive words down below, they lose their turn, and no one receives that card or point. LRT and patient's took turns being the one to describe the words while the rest of the group tried to guess what they were describing.   Goal Area(s) Addressed: Patient will identify physical symptoms of stress. Patient will identify emotional symptoms of stress. Patient will identify coping skills for stress. Patient will build frustration tolerance skills.  Patient will increase communication.   Affect/Mood: Appropriate   Participation Level: Active and Engaged   Participation Quality: Independent   Behavior: Appropriate, Calm, and Cooperative   Speech/Thought Process: Coherent   Insight: Good   Judgement: Good   Modes of Intervention: Cooperative Play and Group work   Patient Response to Interventions:  Attentive, Engaged, Interested , and Receptive   Education Outcome:  Acknowledges education   Clinical Observations/Individualized Feedback: Carly [name] was active in their participation of session activities and group discussion. Pt interacted well with LRT and peers duration of session.    Plan: Continue to engage patient in RT group sessions 2-3x/week.   Jeoffrey FORBES Celestia, LRT, CTRS 01/14/2024 5:35 PM

## 2024-01-14 NOTE — Plan of Care (Signed)
  Problem: Education: Goal: Knowledge of [facility] Education information/materials will improve Outcome: Completed/Met Goal: Emotional status will improve Outcome: Adequate for Discharge Goal: Mental status will improve Outcome: Adequate for Discharge   Problem: Activity: Goal: Sleeping patterns will improve Outcome: Progressing   Problem: Coping: Goal: Ability to verbalize frustrations and anger appropriately will improve Outcome: Progressing

## 2024-01-14 NOTE — Progress Notes (Signed)
   01/14/24 0900  Psych Admission Type (Psych Patients Only)  Admission Status Voluntary  Psychosocial Assessment  Patient Complaints Anxiety  Eye Contact Fair  Facial Expression Flat  Affect Anxious  Speech Logical/coherent  Interaction Assertive  Motor Activity Slow  Appearance/Hygiene Body odor  Behavior Characteristics Cooperative  Mood Pleasant  Thought Process  Coherency WDL  Content WDL  Delusions None reported or observed  Perception WDL  Hallucination None reported or observed  Judgment Poor  Confusion None  Danger to Self  Current suicidal ideation? Denies  Danger to Others  Danger to Others None reported or observed

## 2024-01-14 NOTE — Group Note (Signed)
 Date:  01/14/2024 Time:  8:51 PM  Group Topic/Focus:  Wrap-Up Group:   The focus of this group is to help patients review their daily goal of treatment and discuss progress on daily workbooks. Patients meditated and discussed positive things happening in life currently.    Participation Level:  Active  Participation Quality:  Appropriate  Affect:  Appropriate  Cognitive:  Appropriate  Insight: Appropriate  Engagement in Group:  Engaged  Modes of Intervention:  Activity  Additional Comments:    Leigh VEAR Pais 01/14/2024, 8:51 PM

## 2024-01-15 ENCOUNTER — Inpatient Hospital Stay

## 2024-01-15 DIAGNOSIS — F1094 Alcohol use, unspecified with alcohol-induced mood disorder: Secondary | ICD-10-CM | POA: Diagnosis not present

## 2024-01-15 DIAGNOSIS — F3181 Bipolar II disorder: Secondary | ICD-10-CM

## 2024-01-15 MED ORDER — LAMOTRIGINE 25 MG PO TABS: 25.0000 mg | ORAL_TABLET | Freq: Every day | ORAL | 0 refills | Status: DC

## 2024-01-15 MED ORDER — HYDROXYZINE HCL 25 MG PO TABS: 25.0000 mg | ORAL_TABLET | Freq: Two times a day (BID) | ORAL | 0 refills | Status: AC | PRN

## 2024-01-15 MED ORDER — TRAZODONE HCL 50 MG PO TABS: 50.0000 mg | ORAL_TABLET | Freq: Every day | ORAL | 0 refills | Status: DC

## 2024-01-15 MED ORDER — ARIPIPRAZOLE 5 MG PO TABS: 5.0000 mg | ORAL_TABLET | Freq: Every day | ORAL | 0 refills | Status: AC

## 2024-01-15 NOTE — Progress Notes (Signed)
   01/14/24 0900  Psych Admission Type (Psych Patients Only)  Admission Status Voluntary  Psychosocial Assessment  Patient Complaints Anxiety  Eye Contact Fair  Facial Expression Flat  Affect Anxious  Speech Logical/coherent  Interaction Assertive  Motor Activity Slow  Appearance/Hygiene Body odor  Behavior Characteristics Cooperative  Mood Pleasant  Thought Process  Coherency WDL  Content WDL  Delusions None reported or observed  Perception WDL  Hallucination None reported or observed  Judgment Poor  Confusion None  Danger to Self  Current suicidal ideation? Denies  Danger to Others  Danger to Others None reported or observed

## 2024-01-15 NOTE — Plan of Care (Signed)
   Problem: Education: Goal: Emotional status will improve Outcome: Progressing Goal: Mental status will improve Outcome: Progressing Goal: Verbalization of understanding the information provided will improve Outcome: Progressing   Problem: Activity: Goal: Interest or engagement in activities will improve Outcome: Progressing Goal: Sleeping patterns will improve Outcome: Progressing

## 2024-01-15 NOTE — Plan of Care (Signed)
  Problem: Education: Goal: Emotional status will improve 01/15/2024 1010 by Deidre Sartorius, RN Outcome: Completed/Met 01/15/2024 0906 by Deidre Sartorius, RN Outcome: Adequate for Discharge Goal: Mental status will improve 01/15/2024 1010 by Deidre Sartorius, RN Outcome: Completed/Met 01/15/2024 0906 by Deidre Sartorius, RN Outcome: Adequate for Discharge Goal: Verbalization of understanding the information provided will improve 01/15/2024 1010 by Deidre Sartorius, RN Outcome: Completed/Met 01/15/2024 0906 by Deidre Sartorius, RN Outcome: Adequate for Discharge   Problem: Activity: Goal: Interest or engagement in activities will improve 01/15/2024 1010 by Deidre Sartorius, RN Outcome: Completed/Met 01/15/2024 0906 by Deidre Sartorius, RN Outcome: Progressing Goal: Sleeping patterns will improve Outcome: Completed/Met   Problem: Coping: Goal: Ability to verbalize frustrations and anger appropriately will improve Outcome: Completed/Met Goal: Ability to demonstrate self-control will improve Outcome: Completed/Met

## 2024-01-15 NOTE — BHH Suicide Risk Assessment (Signed)
 [facility] Discharge Suicide Risk Assessment   Principal Problem: Alcohol-induced mood disorder with depressive symptoms (HCC) Discharge Diagnoses: Principal Problem:   Alcohol-induced mood disorder with depressive symptoms (HCC) Active Problems:   Bipolar 2 disorder (HCC)   Total Time spent with patient: 45 minutes  Musculoskeletal: Strength & Muscle Tone: within normal limits Gait & Station: normal Patient leans: N/A  Psychiatric Specialty Exam  Presentation  General Appearance:  Casual  Eye Contact: Fair  Speech: Clear and Coherent  Speech Volume: Normal  Handedness:No data recorded  Mood and Affect  Mood: Euthymic  Duration of Depression Symptoms: No data recorded Affect: Congruent   Thought Process  Thought Processes: Coherent  Descriptions of Associations:Intact  Orientation:Full (Time, Place and Person)  Thought Content:Logical  History of Schizophrenia/Schizoaffective disorder:No data recorded Duration of Psychotic Symptoms:No data recorded Hallucinations:Hallucinations: None  Ideas of Reference:None  Suicidal Thoughts:Suicidal Thoughts: No  Homicidal Thoughts:Homicidal Thoughts: No   Sensorium  Memory: Immediate Fair; Recent Fair  Judgment: Intact  Insight: Fair   Art therapist  Concentration: Good  Attention Span: Good  Recall: Good  Fund of Knowledge: Good  Language: Good   Psychomotor Activity  Psychomotor Activity: Psychomotor Activity: Normal   Assets  Assets: Communication Skills; Desire for Improvement   Sleep  Sleep: Sleep: Fair  Estimated Sleeping Duration (Last 24 Hours): 6.25-8.50 hours  Physical Exam: Physical Exam Vitals and nursing note reviewed.  HENT:     Head: Atraumatic.  Eyes:     Extraocular Movements: Extraocular movements intact.  Pulmonary:     Effort: Pulmonary effort is normal.  Neurological:     Mental Status: She is alert and oriented to person, place, and time.   Psychiatric:        Mood and Affect: Mood normal.        Behavior: Behavior normal.        Thought Content: Thought content normal.        Judgment: Judgment normal.    Review of Systems  Psychiatric/Behavioral:  Negative for depression, hallucinations, memory loss, substance abuse and suicidal ideas. The patient is not nervous/anxious and does not have insomnia.    Blood pressure (!) 161/79, pulse 87, temperature 97.8 F (36.6 C), resp. rate 20, height 5' 2 (1.575 m), weight 99.8 kg, SpO2 96%. Body mass index is 40.24 kg/m.  Mental Status Per Nursing Assessment::   On Admission:  NA  Demographic Factors:  NA  Loss Factors: NA  Historical Factors: NA  Risk Reduction Factors:   Living with another person, especially a relative, Positive social support, Positive therapeutic relationship, and Positive coping skills or problem solving skills  Continued Clinical Symptoms:  Previous Psychiatric Diagnoses and Treatments  Cognitive Features That Contribute To Risk:  None    Suicide Risk:  Minimal: No identifiable suicidal ideation.  Patients presenting with no risk factors but with morbid ruminations; may be classified as minimal risk based on the severity of the depressive symptoms   Follow-up Information     [facility] Follow up.   Why: Virtual psychiatry appointment is 02/05/24 at 3:30 PM with your psychiatrist, Joyln Ferrell, MD Contact information: [address] [place]  [place] [address] [phone]         [facility]. Go to.   Why: Virtual appointment is 01/21/24 at 5 PM with therapist, Heart Wilson, MSW, LCSW. Contact information: [address]  [place], [place] [address]   Phone: [phone] Fax: [phone]  Plan Of Care/Follow-up recommendations:  # It is recommended to the patient to continue psychiatric medications as prescribed, after discharge from the hospital.   # It is recommended to the  patient to follow up with your outpatient psychiatric provider and PCP. # It was discussed with the patient, the impact of alcohol, drugs, tobacco have been there overall psychiatric and medical wellbeing, and total abstinence from substance use was recommended. # Prescriptions provided or sent directly to preferred pharmacy at discharge. Patient agreeable to plan. Given the opportunity to ask questions. Appears to feel comfortable with discharge.  # In the event of worsening symptoms, the patient is instructed to call the crisis hotline (988), 911 and or go to the nearest ED for appropriate evaluation and treatment of symptoms. To follow-up with primary care provider for other medical issues, concerns and or health care needs # Patient was discharged home as requested with a plan to follow up as noted above.    Donnice FORBES Right, PA-C 01/15/2024, 9:43 AM

## 2024-01-15 NOTE — Progress Notes (Signed)
   01/15/24 0800  Psych Admission Type (Psych Patients Only)  Admission Status Voluntary  Psychosocial Assessment  Patient Complaints Anxiety  Eye Contact Fair  Facial Expression Flat  Affect Anxious  Speech Logical/coherent  Interaction Assertive  Motor Activity Other (Comment) (WNL)  Appearance/Hygiene Improved  Behavior Characteristics Cooperative  Mood Pleasant  Thought Process  Coherency WDL  Content WDL  Delusions None reported or observed  Perception WDL  Hallucination None reported or observed  Judgment Poor  Confusion None  Danger to Self  Current suicidal ideation? Denies  Agreement Not to Harm Self Yes  Description of Agreement verbal  Danger to Others  Danger to Others None reported or observed

## 2024-01-15 NOTE — Group Note (Signed)
 Date:  01/15/2024 Time:  10:18 AM  Group Topic/Focus:  Goals Group:   The focus of this group is to help patients establish daily goals to achieve during treatment and discuss how the patient can incorporate goal setting into their daily lives to aide in recovery.    Participation Level:  Active  Participation Quality:  Appropriate  Affect:  Appropriate  Cognitive:  Alert  Insight: Appropriate  Engagement in Group:  Engaged  Modes of Intervention:  Activity, Discussion, and Education  Additional Comments:    Skippy LITTIE Bennett 01/15/2024, 10:18 AM

## 2024-01-15 NOTE — Progress Notes (Signed)
  [facility] Adult Case Management Discharge Plan :  Will you be returning to the same living situation after discharge:  Yes,  Patient to return home.  At discharge, do you have transportation home?: Yes,  Patient's husband to provide transportation.  Do you have the ability to pay for your medications: Yes,  BLUE CROSS BLUE SHIELD / BCBS COMM PPO  Release of information consent forms completed and in the chart;  Patient's signature needed at discharge.  Patient to Follow up at:  Follow-up Information     [facility] Follow up.   Why: Virtual psychiatry appointment is 02/05/24 at 3:30 PM with your psychiatrist, Joyln Ferrell, MD Contact information: [address] [place]  [place] [address] [phone]         [facility]. Go to.   Why: Virtual appointment is 01/21/24 at 5 PM with therapist, Heart Wilson, MSW, LCSW. Contact information: [address]  [place], [place] [address]   Phone: [phone] Fax: [phone]                Next level of care provider has access to [facility] Link:no  Safety Planning and Suicide Prevention discussed: Yes,  Education Completed; Vyctoria Dickman, [phone], Husband, has been identified by the patient as the family member/significant other with whom the patient will be residing, and identified as the person(s) who will aid the patient in the event of a mental health crisis (suicidal ideations/suicide attempt).  With written consent from the patient, the family member/significant other has been provided the following suicide prevention education, prior to the and/or following the discharge of the patient.     Has patient been referred to the Quitline?: Patient refused referral for treatment  Patient has been referred for addiction treatment: No known substance use disorder.  Alveta CHRISTELLA Kerns, LCSW 01/15/2024, 8:40 AM

## 2024-01-15 NOTE — Progress Notes (Signed)
 Patient ID: Carly [name], female   DOB: Oct 09, 1970, 53 y.o.   MRN: [id number] Discharge instructions, follow up appointment, medications reviewed, patient verbalized understanding. Pt discharged home.

## 2024-01-15 NOTE — Discharge Summary (Signed)
 Physician Discharge Summary Note  Patient:  Carly [name] is an 53 y.o., female MRN:  [id number] DOB:  Oct 22, 1970 Patient phone:  [phone] (home)  Patient address:   [address] [place] [address],    Date of Admission:  01/11/2024 Date of Discharge: 01/15/2024  Reason for Admission:  Patient is a 53 year old female with past medical history of COPD and emphysema, and past psychiatric history of bipolar II disorder without psychotic features and generalized anxiety disorder with panic episodes, who presented to the emergency department with acute emotional distress and reported excessive alcohol consumption in response to recent life stressors.   Principal Problem: Alcohol-induced mood disorder with depressive symptoms (HCC) Discharge Diagnoses: Principal Problem:   Alcohol-induced mood disorder with depressive symptoms (HCC) Active Problems:   Bipolar 2 disorder (HCC)   Psychiatric History Diagnoses: Bipolar II disorder, GAD with panic episodes, alcohol use disorder Psychiatric Admissions: Denied Outpatient Psychiatric Care: Yes   Social History:  Social History   Substance and Sexual Activity  Alcohol Use Yes   Alcohol/week: 8.0 standard drinks of alcohol   Types: 8 Standard drinks or equivalent per week   Comment: Beer (Can)      Social History   Substance and Sexual Activity  Drug Use No   Comment: CBD oil    Social History   Socioeconomic History   Marital status: Married    Spouse name: Not on file   Number of children: Not on file   Years of education: Not on file   Highest education level: Not on file  Occupational History   Not on file  Tobacco Use   Smoking status: Every Day    Current packs/day: 1.50    Average packs/day: 1.5 packs/day for 28.0 years (42.0 ttl pk-yrs)    Types: Cigarettes   Smokeless tobacco: Never   Tobacco comments:    1 PPD khj 07/03/2023  Vaping Use   Vaping status: Every Day  Substance and Sexual  Activity   Alcohol use: Yes    Alcohol/week: 8.0 standard drinks of alcohol    Types: 8 Standard drinks or equivalent per week    Comment: Beer (Can)    Drug use: No    Comment: CBD oil   Sexual activity: Yes    Partners: Male  Other Topics Concern   Not on file  Social History Narrative   1 Year college    Works in a Warehouse- [employer]   Married to [place]    Enjoys coloring    Pets: Dog lives inside   Children: 61 Son 3 Son   Social Drivers of Corporate investment banker Strain: Not on file  Food Insecurity: No Food Insecurity (01/11/2024)   Hunger Vital Sign    Worried About Running Out of Food in the Last Year: Never true    Ran Out of Food in the Last Year: Never true  Transportation Needs: No Transportation Needs (01/11/2024)   PRAPARE - Administrator, Civil Service (Medical): No    Lack of Transportation (Non-Medical): No  Physical Activity: Not on file  Stress: Not on file  Social Connections: Moderately Isolated (08/07/2023)   Social Connection and Isolation Panel    Frequency of Communication with Friends and Family: More than three times a week    Frequency of Social Gatherings with Friends and Family: More than three times a week    Attends Religious Services: Never    Active Member  of Clubs or Organizations: No    Attends Banker Meetings: Never    Marital Status: Married   Past Medical History:  Past Medical History:  Diagnosis Date   Abnormal uterine bleeding (AUB) 08/26/2018   Anxiety    C. difficile diarrhea    07/04/21   Chicken pox    Depression    Depression, major, single episode, moderate (HCC) 08/04/2018   Diarrhea 06/07/2022   Dyspnea on exertion 05/24/2022   Elevated testosterone  level in female    Emphysema of lung (HCC)    bronchitis   Frequent headaches    Generalized anxiety disorder 08/23/2014   Hay fever    Hypertension    Idiopathic thrombocytopenic purpura (ITP) (HCC)    Iron deficiency  09/02/2017   Positive ANA (antinuclear antibody)    Thrombocytopenia (HCC) 02/12/2016    Past Surgical History:  Procedure Laterality Date   HEMATOMA EVACUATION N/A 04/07/2020   Procedure: EVACUATION HEMATOMA  AND APPLICATION OF PRESSURE DRESSING;  Surgeon: Verdon Keen, MD;  Location: [facility] ORS;  Service: Gynecology;  Laterality: N/A;   HYSTEROSCOPY WITH NOVASURE N/A 02/21/2018   Procedure: HYSTEROSCOPY WITH NOVASURE, POLYPECTOMY;  Surgeon: Verdon Keen, MD;  Location: [facility] ORS;  Service: Gynecology;  Laterality: N/A;   TUBAL LIGATION  1997   TUBAL LIGATION     Family History:  Family History  Problem Relation Age of Onset   Hyperlipidemia Mother    Heart disease Mother    Stroke Mother    Depression Mother    Mental illness Mother    Heart attack Mother 85   Hyperlipidemia Father    Depression Father    Mental illness Father    Diabetes Paternal Grandfather    Cancer [facility] Course:    The patient is a 54 year old female with a history of COPD, emphysema, Bipolar II Disorder, and Generalized Anxiety Disorder, who was admitted following an emergency department presentation for acute emotional distress and alcohol intoxication, with a blood alcohol level of 397. At the time of presentation, she endorsed vague suicidal ideation, which she later attributed to the effects of alcohol. She reported a pattern of daily alcohol use, approximately one pint of liquor per day, in response to escalating psychosocial stressors including her husband's recent diagnosis of dementia and subsequent job loss. She also endorsed increased anxiety, panic episodes, and depressive symptoms.  On admission, the patient was noted to be nonadherent to her home lamotrigine  regimen for over a week. Aripiprazole  was increased, and lamotrigine  was reinitiated at 25 mg. Escitalopram  was discontinued. Hydroxyzine was initiated for anxiety as needed. Naltrexone was considered for alcohol use disorder,  but this was deferred due to elevated liver enzymes, with AST 164 and ALT 45. Other labs including CBC, CMP, UDS, and creatinine were unremarkable.  Over the course of hospitalization, the patient was observed to be pleasant, future oriented, and engaged with staff and peers. She consistently denied suicidal or homicidal ideation, hallucinations, or delusional thinking. By 6/30, she reported stable sleep and appetite, denied adverse medication effects, and rated her depression and anxiety as 1 out of 10. She demonstrated good insight into the importance of medication adherence and follow-up, and confirmed an upcoming psychiatric appointment with her outpatient provider.  On 7/1, the patient remained stable, with continued improvement in mood and affect. She reported feeling positive about her family relationships and expressed excitement about her grandchildren, including the anticipated birth of twins. She denied ongoing psychiatric symptoms, withdrawal symptoms,  or medication side effects, and expressed motivation to maintain sobriety through attending AA meetings after discharge.  Detailed risk assessment is complete based on clinical exam and individual risk factors and acute suicide risk is low and acute violence risk is low.     Currently, all modifiable risk of harm to self/harm to others have been addressed and patient is no longer appropriate for the acute inpatient setting and is able to continue treatment for mental health needs in the community with the supports as indicated below.  Patient is educated and verbalized understanding of discharge plan of care including medications, follow-up appointments, mental health resources and further crisis services in the community.  He is instructed to call 911 or present to the nearest emergency room should he experience any decompensation in mood, disturbance of bowel or return of suicidal/homicidal ideations.  Patient verbalizes understanding of this  education and agrees to this plan of care  Physical Findings: AIMS:  , ,  ,  ,    CIWA:  CIWA-Ar Total: 0 COWS:        Psychiatric Specialty Exam:  Presentation  General Appearance:  Casual  Eye Contact: Fair  Speech: Clear and Coherent  Speech Volume: Normal    Mood and Affect  Mood: Euthymic  Affect: Congruent   Thought Process  Thought Processes: Coherent  Descriptions of Associations:Intact  Orientation:Full (Time, Place and Person)  Thought Content:Logical  Hallucinations:Hallucinations: None  Ideas of Reference:None  Suicidal Thoughts:Suicidal Thoughts: No  Homicidal Thoughts:Homicidal Thoughts: No   Sensorium  Memory: Immediate Fair; Recent Fair  Judgment: Intact  Insight: Fair   Art therapist  Concentration: Good  Attention Span: Good  Recall: Good  Fund of Knowledge: Good  Language: Good   Psychomotor Activity  Psychomotor Activity: Psychomotor Activity: Normal  Musculoskeletal: Strength & Muscle Tone: within normal limits Gait & Station: normal Assets  Assets: Manufacturing systems engineer; Desire for Improvement   Sleep  Sleep: Sleep: Fair    Physical Exam: Physical Exam Vitals and nursing note reviewed.  Constitutional:      Appearance: She is obese.  HENT:     Head: Atraumatic.  Eyes:     Extraocular Movements: Extraocular movements intact.  Pulmonary:     Effort: Pulmonary effort is normal.  Neurological:     Mental Status: She is alert and oriented to person, place, and time.  Psychiatric:        Mood and Affect: Mood normal.        Behavior: Behavior normal.        Thought Content: Thought content normal.        Judgment: Judgment normal.    Review of Systems  Psychiatric/Behavioral:  Negative for depression, hallucinations, memory loss, substance abuse and suicidal ideas. The patient is nervous/anxious. The patient does not have insomnia.    Blood pressure (!) 161/79, pulse 87,  temperature 97.8 F (36.6 C), resp. rate 20, height 5' 2 (1.575 m), weight 99.8 kg, SpO2 96%. Body mass index is 40.24 kg/m.   Social History   Tobacco Use  Smoking Status Every Day   Current packs/day: 1.50   Average packs/day: 1.5 packs/day for 28.0 years (42.0 ttl pk-yrs)   Types: Cigarettes  Smokeless Tobacco Never  Tobacco Comments   1 PPD khj 07/03/2023   Tobacco Cessation:  A prescription for an FDA-approved tobacco cessation medication was offered at discharge and the patient refused   Blood Alcohol level:  Lab Results  Component Value Date   ETH 397 [facility])  01/10/2024    Metabolic Disorder Labs:  Lab Results  Component Value Date   HGBA1C 5.4 01/14/2024   MPG 108.28 01/14/2024   MPG 117 09/09/2022   Lab Results  Component Value Date   PROLACTIN 4.4 09/23/2014   Lab Results  Component Value Date   CHOL 170 01/14/2024   TRIG 91 01/14/2024   HDL 34 (L) 01/14/2024   CHOLHDL 5.0 01/14/2024   VLDL 18 01/14/2024   LDLCALC 118 (H) 01/14/2024   LDLCALC 103 (H) 11/06/2022    See Psychiatric Specialty Exam and Suicide Risk Assessment completed by Attending Physician prior to discharge.  Discharge destination:  Home  Is patient on multiple antipsychotic therapies at discharge:  No   Has Patient had three or more failed trials of antipsychotic monotherapy by history:  No  Recommended Plan for Multiple Antipsychotic Therapies: NA  Discharge Instructions     Diet - low sodium heart healthy   Complete by: As directed    Increase activity slowly   Complete by: As directed       Allergies as of 01/15/2024       Reactions   Wellbutrin  [bupropion ]    Crazy   Meloxicam  Nausea Only        Medication List     STOP taking these medications    acetaminophen  500 MG tablet Commonly known as: TYLENOL    benzonatate  100 MG capsule Commonly known as: TESSALON    cyanocobalamin  1000 MCG/ML injection Commonly known as: VITAMIN B12   D3-50 1.25 MG (50000  UT) capsule Generic drug: Cholecalciferol    escitalopram  20 MG tablet Commonly known as: LEXAPRO        TAKE these medications      Indication  albuterol  (2.5 MG/3ML) 0.083% nebulizer solution Commonly known as: PROVENTIL  Take 3 mLs (2.5 mg total) by nebulization every 6 (six) hours as needed for wheezing or shortness of breath.  Indication: Worsening of Chronic Obstructive Lung Disease   albuterol  108 (90 Base) MCG/ACT inhaler Commonly known as: VENTOLIN  HFA INHALE 2 PUFFS INTO THE LUNGS EVERY 6 HOURS AS NEEDED FOR WHEEZING OR SHORTNESS OF BREATH (COUGH)  Indication: Spasm of Lung Air Passages, Chronic Obstructive Lung Disease   ARIPiprazole  5 MG tablet Commonly known as: ABILIFY  Take 1 tablet (5 mg total) by mouth daily.  Indication: MIXED BIPOLAR AFFECTIVE DISORDER   Breztri  Aerosphere 160-9-4.8 MCG/ACT Aero inhaler Generic drug: budesonide -glycopyrrolate-formoterol  INHALE 2 PUFFS INTO THE LUNGS IN THE MORNING AND AT BEDTIME.  Indication: Chronic Obstructive Lung Disease   hydrOXYzine 25 MG tablet Commonly known as: ATARAX Take 1 tablet (25 mg total) by mouth 2 (two) times daily as needed for anxiety.  Indication: Feeling Anxious   lamoTRIgine  25 MG tablet Commonly known as: LAMICTAL  Take 1 tablet (25 mg total) by mouth daily. What changed:  medication strength how much to take  Indication: Manic-Depression   metoprolol  succinate 100 MG 24 hr tablet Commonly known as: TOPROL -XL Take 1 tablet (100 mg total) by mouth daily. What changed: additional instructions  Indication: High Blood Pressure   omeprazole  20 MG capsule Commonly known as: PRILOSEC TAKE 1 CAPSULE BY MOUTH EVERY DAY  Indication: Gastroesophageal Reflux Disease   traZODone  50 MG tablet Commonly known as: DESYREL  Take 1 tablet (50 mg total) by mouth at bedtime. What changed:  how much to take how to take this when to take this additional instructions  Indication: Trouble Sleeping         Follow-up Information     [facility] Follow up.   Why: Virtual psychiatry appointment is 02/05/24 at 3:30 PM with your psychiatrist, Joyln Ferrell, MD Contact information: [address] [place]  [place] [address] [phone]         [facility]. Go to.   Why: Virtual appointment is 01/21/24 at 5 PM with therapist, Heart Wilson, MSW, LCSW. Contact information: [address]  [place], [place] [address]   Phone: [phone] Fax: [phone]                Follow-up recommendations:  # It is recommended to the patient to continue psychiatric medications as prescribed, after discharge from the hospital.   # It is recommended to the patient to follow up with your outpatient psychiatric provider and PCP. # It was discussed with the patient, the impact of alcohol, drugs, tobacco have been there overall psychiatric and medical wellbeing, and total abstinence from substance use was recommended. # Prescriptions provided or sent directly to preferred pharmacy at discharge. Patient agreeable to plan. Given the opportunity to ask questions. Appears to feel comfortable with discharge.  # In the event of worsening symptoms, the patient is instructed to call the crisis hotline (988), 911 and or go to the nearest ED for appropriate evaluation and treatment of symptoms. To follow-up with primary care provider for other medical issues, concerns and or health care needs # Patient was discharged home as requested with a plan to follow up as noted above.      Signed: Donnice FORBES Right, PA-C 01/15/2024, 9:47 AM

## 2024-01-15 NOTE — Plan of Care (Signed)
  Problem: Education: Goal: Emotional status will improve Outcome: Adequate for Discharge Goal: Mental status will improve Outcome: Adequate for Discharge Goal: Verbalization of understanding the information provided will improve Outcome: Adequate for Discharge   Problem: Activity: Goal: Interest or engagement in activities will improve Outcome: Progressing

## 2024-01-16 ENCOUNTER — Telehealth: Payer: Self-pay

## 2024-01-16 NOTE — Patient Instructions (Signed)
 Visit Information  Thank you for taking time to visit with me today. Please don't hesitate to contact me if I can be of assistance to you.    Intervention:  Keep follow up appointments scheduled with psychiatrist on 02/05/24 at 3:30 pm And [facility] health therapist on 01/21/24 at 5 pm Contact primary care provider for questions/ concerns   Patient verbalizes understanding of instructions and care plan provided today and agrees to view in MyChart. Active MyChart status and patient understanding of how to access instructions and care plan via MyChart confirmed with patient.     The patient has been provided with contact information for the care management team and has been advised to call with any health related questions or concerns.   Please call the care guide team at [phone] if you need to cancel or reschedule your appointment.   Please call the Suicide and Crisis Lifeline: 988 call 1-800-273-TALK (toll free, 24 hour hotline) if you are experiencing a Mental Health or Behavioral Health Crisis or need someone to talk to.  Arvin Seip RN, BSN, CCM [employer], Population Health Case Manager Phone: [phone]

## 2024-01-16 NOTE — Transitions of Care (Post Inpatient/ED Visit) (Signed)
 01/16/2024  Name: Carly [name] MRN: [id number] DOB: Jun 05, 1971  Today's TOC FU Call Status: Today's TOC FU Call Status:: Successful TOC FU Call Completed TOC FU Call Complete Date: 01/16/24 Patient's Name and Date of Birth confirmed.  Transition Care Management Follow-up Telephone Call Date of Discharge: 01/15/24 Discharge Facility: [facility]) Type of Discharge: Inpatient Admission Primary Inpatient Discharge Diagnosis:: Alcohol-induced mood disorder with depressive symptoms How have you been since you were released from the hospital?: Better (Patient states doing really good.) Any questions or concerns?: No  Items Reviewed: Did you receive and understand the discharge instructions provided?:  (Reviewed discharged instructions with patient on this call.) Medications obtained,verified, and reconciled?: Yes (Medications Reviewed) Any new allergies since your discharge?: No Dietary orders reviewed?: Yes Type of Diet Ordered:: Low sodium/ heart health Do you have support at home?: Yes People in Home [RPT]: spouse Name of Support/Comfort Primary Source: Kory Panjwani / spouse  Medications Reviewed Today: Medications Reviewed Today     Reviewed by Binta Statzer E, RN (Registered Nurse) on 01/16/24 at 1344  Med List Status: <None>   Medication Order Taking? Sig Documenting Provider Last Dose Status Informant  albuterol  (PROVENTIL ) (2.5 MG/3ML) 0.083% nebulizer solution [id number] Yes Take 3 mLs (2.5 mg total) by nebulization every 6 (six) hours as needed for wheezing or shortness of breath. Vicky Charleston, PA-C  Active Self, Pharmacy Records  albuterol  (VENTOLIN  HFA) 108 (90 Base) MCG/ACT inhaler [id number] Yes INHALE 2 PUFFS INTO THE LUNGS EVERY 6 HOURS AS NEEDED FOR WHEEZING OR SHORTNESS OF BREATH (COUGH) Tamea Dedra CROME, MD  Active Self, Pharmacy Records  ARIPiprazole  (ABILIFY ) 5 MG tablet [id number] Yes Take 1 tablet (5 mg total) by mouth daily.  Millington, Matthew E, PA-C  Active   BREZTRI  AEROSPHERE 160-9-4.8 MCG/ACT AERO inhaler [id number] Yes INHALE 2 PUFFS INTO THE LUNGS IN THE MORNING AND AT BEDTIME. Tamea Dedra CROME, MD  Active Self, Pharmacy Records  hydrOXYzine (ATARAX) 25 MG tablet [id number] Yes Take 1 tablet (25 mg total) by mouth 2 (two) times daily as needed for anxiety. Millington, Matthew E, PA-C  Active   lamoTRIgine  (LAMICTAL ) 25 MG tablet [id number] Yes Take 1 tablet (25 mg total) by mouth daily. Millington, Matthew E, PA-C  Active   metoprolol  succinate (TOPROL -XL) 100 MG 24 hr tablet [id number] Yes TAKE 1 TABLET BY MOUTH DAILY. TAKE WITH OR IMMEDIATELY FOLLOWING A MEAL. Hope Merle, MD  Active Self, Pharmacy Records  omeprazole  [facility]) 20 MG capsule [id number] Yes TAKE 1 CAPSULE BY MOUTH EVERY DAY Hope Merle, MD  Active Self, Pharmacy Records  traZODone  (DESYREL ) 50 MG tablet [id number] Yes Take 1 tablet (50 mg total) by mouth at bedtime. Debbie Donnice BRAVO, PA-C  Active             Home Care and Equipment/Supplies: Were Home Health Services Ordered?: No Any new equipment or medical supplies ordered?: No  Functional Questionnaire: Do you need assistance with bathing/showering or dressing?: No Do you need assistance with meal preparation?: No Do you need assistance with eating?: No Do you have difficulty maintaining continence: No Do you need assistance with getting out of bed/getting out of a chair/moving?: No Do you have difficulty managing or taking your medications?: No  Follow up appointments reviewed: Specialist Hospital Follow-up appointment confirmed?: Yes Date of Specialist follow-up appointment?: 02/05/24 Follow-Up Specialty Provider:: Dr. Joyln Ferrell Do you need transportation to your follow-up appointment?: No Do you understand care options if your condition(s) worsen?: Yes-patient verbalized understanding  SDOH Interventions Today    Flowsheet Row Most Recent Value  SDOH  Interventions   Food Insecurity Interventions Intervention Not Indicated  Housing Interventions Intervention Not Indicated  Transportation Interventions Intervention Not Indicated  Utilities Interventions Intervention Not Indicated    Dia Jefferys RN, BSN, CCM [place]  [facility] Population Health Case Manager Phone: [phone]

## 2024-01-28 ENCOUNTER — Ambulatory Visit: Admitting: Pulmonary Disease

## 2024-02-05 ENCOUNTER — Inpatient Hospital Stay: Admitting: Oncology

## 2024-02-05 ENCOUNTER — Inpatient Hospital Stay

## 2024-02-19 ENCOUNTER — Other Ambulatory Visit: Payer: Self-pay

## 2024-02-19 DIAGNOSIS — D693 Immune thrombocytopenic purpura: Secondary | ICD-10-CM

## 2024-02-20 ENCOUNTER — Inpatient Hospital Stay

## 2024-02-20 ENCOUNTER — Inpatient Hospital Stay: Admitting: Oncology

## 2024-02-20 ENCOUNTER — Telehealth: Payer: Self-pay | Admitting: Oncology

## 2024-02-20 NOTE — Telephone Encounter (Signed)
 Patient left a voicemail that she is running a fever today and needs to reschedule her appointments for today. She is scheduled for lab/md/nplate  injection. She states she can come anytime late in the afternoon. Please advise rescheduling.   Thank you

## 2024-02-27 ENCOUNTER — Inpatient Hospital Stay (HOSPITAL_BASED_OUTPATIENT_CLINIC_OR_DEPARTMENT_OTHER): Admitting: Oncology

## 2024-02-27 ENCOUNTER — Inpatient Hospital Stay

## 2024-02-27 ENCOUNTER — Encounter: Payer: Self-pay | Admitting: Oncology

## 2024-02-27 ENCOUNTER — Inpatient Hospital Stay: Attending: Oncology

## 2024-02-27 VITALS — BP 133/76 | HR 90 | Temp 98.3°F | Resp 18 | Ht 62.0 in | Wt 221.0 lb

## 2024-02-27 DIAGNOSIS — D693 Immune thrombocytopenic purpura: Secondary | ICD-10-CM | POA: Diagnosis present

## 2024-02-27 LAB — CBC WITH DIFFERENTIAL (CANCER CENTER ONLY)
Abs Immature Granulocytes: 0.03 K/uL (ref 0.00–0.07)
Basophils Absolute: 0.1 K/uL (ref 0.0–0.1)
Basophils Relative: 1 %
Eosinophils Absolute: 0.3 K/uL (ref 0.0–0.5)
Eosinophils Relative: 3 %
HCT: 41.8 % (ref 36.0–46.0)
Hemoglobin: 13.8 g/dL (ref 12.0–15.0)
Immature Granulocytes: 0 %
Lymphocytes Relative: 27 %
Lymphs Abs: 2.5 K/uL (ref 0.7–4.0)
MCH: 29.9 pg (ref 26.0–34.0)
MCHC: 33 g/dL (ref 30.0–36.0)
MCV: 90.5 fL (ref 80.0–100.0)
Monocytes Absolute: 0.9 K/uL (ref 0.1–1.0)
Monocytes Relative: 10 %
Neutro Abs: 5.5 K/uL (ref 1.7–7.7)
Neutrophils Relative %: 59 %
Platelet Count: 51 K/uL — ABNORMAL LOW (ref 150–400)
RBC: 4.62 MIL/uL (ref 3.87–5.11)
RDW: 17.9 % — ABNORMAL HIGH (ref 11.5–15.5)
WBC Count: 9.2 K/uL (ref 4.0–10.5)
nRBC: 0 % (ref 0.0–0.2)

## 2024-02-27 NOTE — Progress Notes (Signed)
 Patient is doing good, no new questions for the doctor today

## 2024-02-27 NOTE — Progress Notes (Signed)
 Carly [name]  [phone] [phone]  ID: Carly [name] OB: 27-Jul-1970  MR#: [id number]  RDW#:[id number]  Patient Care Team: Patient, No Pcp Per as PCP - General (General Practice) Jacobo [name], MD as Consulting Physician (Oncology) Tamea Dedra CROME, MD as Consulting Physician (Pulmonary Disease)   CHIEF COMPLAINT: ITP.  INTERVAL HISTORY: Patient last seen in clinic on November 06, 2023.  She missed several appointments in the interim secondary to personal reasons and admission to behavioral health.  She currently feels well and is asymptomatic.  She does not complain of any easy bleeding or bruising.  She has no neurologic complaints. She has a good appetite and denies weight loss.  She denies any chest pain, shortness of breath, cough, or hemoptysis. She denies any nausea, vomiting, constipation, or diarrhea.  She has no urinary complaints.  Patient offers no specific complaints today.  REVIEW OF SYSTEMS:   Review of Systems  Constitutional: Negative.  Negative for fever, malaise/fatigue and weight loss.  Respiratory: Negative.  Negative for cough, hemoptysis and shortness of breath.   Cardiovascular: Negative.  Negative for chest pain and leg swelling.  Gastrointestinal: Negative.  Negative for abdominal pain, blood in stool and melena.  Genitourinary: Negative.  Negative for hematuria.  Musculoskeletal: Negative.  Negative for back pain.  Skin: Negative.  Negative for rash.  Neurological: Negative.  Negative for dizziness, focal weakness, weakness and headaches.  Endo/Heme/Allergies: Negative.  Does not bruise/bleed easily.  Psychiatric/Behavioral: Negative.  The patient is not nervous/anxious.     As per HPI. Otherwise, a complete review of systems is negative.  PAST MEDICAL HISTORY: Past Medical History:  Diagnosis Date   Abnormal uterine bleeding (AUB) 08/26/2018   Anxiety    C. difficile diarrhea    07/04/21   Chicken pox     Depression    Depression, major, single episode, moderate (HCC) 08/04/2018   Diarrhea 06/07/2022   Dyspnea on exertion 05/24/2022   Elevated testosterone  level in female    Emphysema of lung (HCC)    bronchitis   Frequent headaches    Generalized anxiety disorder 08/23/2014   Hay fever    Hypertension    Idiopathic thrombocytopenic purpura (ITP) (HCC)    Iron deficiency 09/02/2017   Positive ANA (antinuclear antibody)    Thrombocytopenia (HCC) 02/12/2016    PAST SURGICAL HISTORY: Past Surgical History:  Procedure Laterality Date   HEMATOMA EVACUATION N/A 04/07/2020   Procedure: EVACUATION HEMATOMA  AND APPLICATION OF PRESSURE DRESSING;  Surgeon: Verdon Keen, MD;  Location: [facility] ORS;  Service: Gynecology;  Laterality: N/A;   HYSTEROSCOPY WITH NOVASURE N/A 02/21/2018   Procedure: HYSTEROSCOPY WITH NOVASURE, POLYPECTOMY;  Surgeon: Verdon Keen, MD;  Location: [facility] ORS;  Service: Gynecology;  Laterality: N/A;   TUBAL LIGATION  1997   TUBAL LIGATION      FAMILY HISTORY: Family History  Problem Relation Age of Onset   Hyperlipidemia Mother    Heart disease Mother    Stroke Mother    Depression Mother    Mental illness Mother    Heart attack Mother 57   Hyperlipidemia Father    Depression Father    Mental illness Father    Diabetes Paternal Grandfather    Cancer Cousin     ADVANCED DIRECTIVES (Y/N):  N  HEALTH MAINTENANCE: Social History   Tobacco Use   Smoking status: Every Day    Current packs/day: 1.50    Average packs/day: 1.5 packs/day for 28.0 years (42.0 ttl pk-yrs)  Types: Cigarettes   Smokeless tobacco: Never   Tobacco comments:    1 PPD khj 07/03/2023  Vaping Use   Vaping status: Every Day  Substance Use Topics   Alcohol use: Yes    Alcohol/week: 8.0 standard drinks of alcohol    Types: 8 Standard drinks or equivalent per week    Comment: Beer (Can)    Drug use: No    Comment: CBD oil     Colonoscopy:  PAP:  Bone density:  Lipid  panel:  Allergies  Allergen Reactions   Wellbutrin  [Bupropion ]     Crazy    Meloxicam  Nausea Only    Current Outpatient Medications  Medication Sig Dispense Refill   albuterol  (PROVENTIL ) (2.5 MG/3ML) 0.083% nebulizer solution Take 3 mLs (2.5 mg total) by nebulization every 6 (six) hours as needed for wheezing or shortness of breath. 150 mL 1   albuterol  (VENTOLIN  HFA) 108 (90 Base) MCG/ACT inhaler INHALE 2 PUFFS INTO THE LUNGS EVERY 6 HOURS AS NEEDED FOR WHEEZING OR SHORTNESS OF BREATH (COUGH) 6.7 each 3   ARIPiprazole  (ABILIFY ) 5 MG tablet Take 1 tablet (5 mg total) by mouth daily. 30 tablet 0   BREZTRI  AEROSPHERE 160-9-4.8 MCG/ACT AERO inhaler INHALE 2 PUFFS INTO THE LUNGS IN THE MORNING AND AT BEDTIME. 10.7 each 5   hydrOXYzine  (ATARAX ) 25 MG tablet Take 1 tablet (25 mg total) by mouth 2 (two) times daily as needed for anxiety. 60 tablet 0   lamoTRIgine  (LAMICTAL ) 25 MG tablet Take 1 tablet (25 mg total) by mouth daily. 30 tablet 0   metoprolol  succinate (TOPROL -XL) 100 MG 24 hr tablet TAKE 1 TABLET BY MOUTH DAILY. TAKE WITH OR IMMEDIATELY FOLLOWING A MEAL. 30 tablet 2   omeprazole  (PRILOSEC) 20 MG capsule TAKE 1 CAPSULE BY MOUTH EVERY DAY 90 capsule 1   traZODone  (DESYREL ) 50 MG tablet Take 1 tablet (50 mg total) by mouth at bedtime. 30 tablet 0   No current facility-administered medications for this visit.    OBJECTIVE: Vitals:   02/27/24 1513  BP: 133/76  Pulse: 90  Resp: 18  Temp: 98.3 F (36.8 C)  SpO2: 97%     Body mass index is 40.42 kg/m.    ECOG FS:0 - Asymptomatic  General: Well-developed, well-nourished, no acute distress. Eyes: Pink conjunctiva, anicteric sclera. HEENT: Normocephalic, moist mucous membranes. Lungs: No audible wheezing or coughing. Heart: Regular rate and rhythm. Abdomen: Soft, nontender, no obvious distention. Musculoskeletal: No edema, cyanosis, or clubbing. Neuro: Alert, answering all questions appropriately. Cranial nerves grossly  intact. Skin: No rashes or petechiae noted. Psych: Normal affect.  LAB RESULTS:  Lab Results  Component Value Date   NA 137 01/10/2024   K 4.1 01/10/2024   CL 97 (L) 01/10/2024   CO2 27 01/10/2024   GLUCOSE 140 (H) 01/10/2024   BUN 7 01/10/2024   CREATININE 0.57 01/10/2024   CALCIUM 8.9 01/10/2024   PROT 6.8 01/10/2024   ALBUMIN 3.4 (L) 01/10/2024   AST 164 (H) 01/10/2024   ALT 45 (H) 01/10/2024   ALKPHOS 264 (H) 01/10/2024   BILITOT 0.9 01/10/2024   GFRNONAA >60 01/10/2024   GFRAA >60 04/08/2020    Lab Results  Component Value Date   WBC 9.2 02/27/2024   NEUTROABS 5.5 02/27/2024   HGB 13.8 02/27/2024   HCT 41.8 02/27/2024   MCV 90.5 02/27/2024   PLT 51 (L) 02/27/2024     STUDIES: No results found.  ASSESSMENT: ITP  PLAN:    ITP: Confirmed with  normal bone marrow biopsy and normal FISH and cytogenetics on July 22, 2019.  Patient had a positive ANA which is likely clinically insignificant.  Previously, the remainder of her laboratory work was either negative or within normal limits.  CT scan of the chest on March 27, 2023 did not reveal any significant pathology.  Did not report any splenomegaly.  Previously, patient received 1 week of prednisone  at 1 mg/kg dose and had no appreciable change in her platelet count.  Rituxan was denied by insurance. In January 2024 patient's platelet count increased from 38-191 with 6 mcg/kg of Nplate .  Patient last received Nplate  on November 06, 2023.  Since that time, her platelet count has remained decreased, but relatively stable at 51.  She did not wish to pursue treatment today.  Return to clinic in 1 month for repeat laboratory, further evaluation, and continuation of treatment if needed.   History of positive ANA: Likely clinically insignificant.  Referral was previously sent to rheumatology.   I spent a total of 20 minutes reviewing chart data, face-to-face evaluation with the patient, counseling and coordination of care as  detailed above.   Patient expressed understanding and was in agreement with this plan. She also understands that She can call clinic at any time with any questions, concerns, or complaints.    Evalene JINNY Reusing, MD   02/29/2024 6:36 PM

## 2024-02-27 NOTE — Progress Notes (Signed)
 Per MD no Nplate  today

## 2024-02-29 ENCOUNTER — Encounter: Payer: Self-pay | Admitting: Oncology

## 2024-03-10 ENCOUNTER — Other Ambulatory Visit: Payer: Self-pay

## 2024-03-10 DIAGNOSIS — I10 Essential (primary) hypertension: Secondary | ICD-10-CM

## 2024-03-10 MED ORDER — METOPROLOL SUCCINATE ER 100 MG PO TB24: 100.0000 mg | ORAL_TABLET | Freq: Every day | ORAL | 2 refills | Status: DC

## 2024-03-11 ENCOUNTER — Ambulatory Visit (INDEPENDENT_AMBULATORY_CARE_PROVIDER_SITE_OTHER): Admitting: Pulmonary Disease

## 2024-03-11 ENCOUNTER — Encounter: Payer: Self-pay | Admitting: Pulmonary Disease

## 2024-03-11 VITALS — BP 160/94 | HR 95 | Temp 98.5°F | Ht 62.0 in | Wt 220.0 lb

## 2024-03-11 DIAGNOSIS — J4489 Other specified chronic obstructive pulmonary disease: Secondary | ICD-10-CM | POA: Diagnosis not present

## 2024-03-11 DIAGNOSIS — R911 Solitary pulmonary nodule: Secondary | ICD-10-CM

## 2024-03-11 DIAGNOSIS — Z87891 Personal history of nicotine dependence: Secondary | ICD-10-CM | POA: Diagnosis not present

## 2024-03-11 DIAGNOSIS — E66813 Obesity, class 3: Secondary | ICD-10-CM

## 2024-03-11 DIAGNOSIS — Z6841 Body Mass Index (BMI) 40.0 and over, adult: Secondary | ICD-10-CM

## 2024-03-11 DIAGNOSIS — F1090 Alcohol use, unspecified, uncomplicated: Secondary | ICD-10-CM

## 2024-03-11 DIAGNOSIS — D693 Immune thrombocytopenic purpura: Secondary | ICD-10-CM

## 2024-03-11 MED ORDER — ALBUTEROL SULFATE (2.5 MG/3ML) 0.083% IN NEBU: 2.5000 mg | INHALATION_SOLUTION | Freq: Four times a day (QID) | RESPIRATORY_TRACT | 1 refills | Status: DC | PRN

## 2024-03-11 NOTE — Progress Notes (Signed)
 Subjective:    Patient ID: Carly [name], female    DOB: 02/25/1971, 53 y.o.   MRN: [id number]  Patient Care Team: Patient, No Pcp Per as PCP - General (General Practice) Jacobo [name], MD as Consulting Physician (Oncology) Tamea Dedra CROME, MD as Consulting Physician (Pulmonary Disease)  Chief Complaint  Patient presents with   Asthma    Breathing is better.    BACKGROUND/INTERVAL:Carly [name] is a 53 year old current smoker (1 PPD) with a history of asthma/COPD overlap, RB ILD and ITP.  Carly [name] has not been seen since 03 July 2023.  Since that visit Carly [name] has had a recent COPD exacerbation in May that required hospitalization at [facility] between 21 Nov 2023 through 24 Nov 2023.   HPI Discussed the use of AI scribe software for clinical note transcription with the patient, who gave verbal consent to proceed.  History of Present Illness   Carly [name] is a 53 year old female with COPD and asthmatic bronchitis who presents for follow-up.  Carly [name] had not been seen here since December 2024.  Earlier this year, Carly [name] experienced a severe flu in January and February, which required hospitalization on two occasions. Carly [name] describes the flu as more severe than COVID. Following the flu, Carly [name] developed bronchitis.  Last [facility] admission was in May as noted above  Carly [name] currently smokes three cigarettes a day, one each in the morning, noon, and night.  Carly [name] continues to use Breztri , which Carly [name] finds beneficial for her condition. Carly [name] also requests a refill for her nebulizer solution, specifically albuterol , which Carly [name] likes to keep on hand.      DATA 05/23/2021 PFTs: FEV1 1.60 L or 60% predicted, FVC 2.32 L or 69% predicted, FEV1/FVC 69%. Lung volumes show hyperinflation and air trapping there is a small airways component with improvement postbronchodilator.  Diffusion capacity is mildly reduced. 06/14/2021 CT Chest:mild emphysema, 3 mm right middle lobe nodule, 2 mm right upper lobe nodule mild diffuse  groundglass opacity and micronodularity likely related to respiratory bronchiolitis/respiratory bronchiolitis interstitial lung disease 03/27/2023 CT angio chest: 5 mm nodule opacity in the medial aspect of the right lower lobe, possibly infectious or inflammatory etiology.  Interval resolution of previously described cluster of nodules in the medial aspect of the right lung base as well as the pleural-based nodule in the posterior aspect of the right upper lobe.  No pleural effusion or pneumothorax.  Hepatic steatosis.  Review of Systems A 10 point review of systems was performed and it is as noted above otherwise negative.   Patient Active Problem List   Diagnosis Date Noted   Bipolar 2 disorder (HCC) 01/12/2024   Bipolar I disorder, most recent episode mixed (HCC) 01/11/2024   Alcohol abuse 01/11/2024   Alcohol-induced mood disorder with depressive symptoms (HCC) 01/11/2024   Left upper quadrant abdominal pain 08/21/2023   Tobacco abuse 08/06/2023   Influenza A 08/02/2023   Transaminitis 08/02/2023   COPD with acute exacerbation (HCC) 07/17/2023   Stress incontinence 04/03/2023   UTI symptoms 04/03/2023   Encounter for tobacco use cessation counseling 03/28/2023   Hypokalemia 09/09/2022   Hyponatremia 09/09/2022   COVID-19 09/09/2022   Tachycardia 09/09/2022   Pulmonary nodules 09/09/2022   Prolonged QT interval 09/09/2022   COPD with acute bronchitis (HCC) 09/08/2022   Emphysema lung (HCC) 08/08/2022   Aortic atherosclerosis (HCC) 08/08/2022   Mood disorder (HCC) 08/08/2022   Colon cancer screening 08/08/2022   Breast cancer screening by mammogram 08/08/2022   Abnormal glucose 08/08/2022  Dyspnea on exertion 05/24/2022   Respiratory bronchiolitis associated interstitial lung disease (HCC) 05/24/2022   Anxiety and depression 03/21/2022   Allergic rhinitis 11/24/2021   Lumbar radiculopathy 07/11/2021   Gastroesophageal reflux disease without esophagitis 07/01/2020    Hyperlipidemia 07/01/2020   Fatty liver 12/24/2019   Angiolipoma of left kidney 12/24/2019   Chronic ITP (idiopathic thrombocytopenia) (HCC) 12/24/2019   Chronic ITP (idiopathic thrombocytopenic purpura) (HCC) 12/02/2019   Plantar fasciitis, bilateral 09/29/2019   Osteoarthritis of both knees 09/29/2019   Overactive bladder 07/14/2019   B12 deficiency 06/10/2019   Vitamin D  deficiency 06/10/2019   Insomnia 08/15/2016   Thrombocytopenia (HCC) 02/12/2016   Carpal tunnel syndrome 01/20/2016   Obesity, Class III, BMI 40-49.9 (morbid obesity) 01/04/2016   Essential hypertension 01/04/2016   Asthma-COPD overlap syndrome (HCC) 01/13/2015   Female hirsutism 09/22/2014   Elevated testosterone  level in female 09/22/2014   Smoking 08/23/2014   Obesity (BMI 30-39.9) 08/23/2014   HTN (hypertension) 08/23/2014    Social History   Tobacco Use   Smoking status: Every Day    Current packs/day: 1.50    Average packs/day: 1.5 packs/day for 28.0 years (42.0 ttl pk-yrs)    Types: Cigarettes   Smokeless tobacco: Never   Tobacco comments:    3 cigarettes daily CLG 03/11/2024  Substance Use Topics   Alcohol use: Yes    Alcohol/week: 8.0 standard drinks of alcohol    Types: 8 Standard drinks or equivalent per week    Comment: Beer (Can)     Allergies  Allergen Reactions   Wellbutrin  [Bupropion ]     Crazy    Meloxicam  Nausea Only    Current Meds  Medication Sig   albuterol  (VENTOLIN  HFA) 108 (90 Base) MCG/ACT inhaler INHALE 2 PUFFS INTO THE LUNGS EVERY 6 HOURS AS NEEDED FOR WHEEZING OR SHORTNESS OF BREATH (COUGH)   ARIPiprazole  (ABILIFY ) 5 MG tablet Take 1 tablet (5 mg total) by mouth daily.   BREZTRI  AEROSPHERE 160-9-4.8 MCG/ACT AERO inhaler INHALE 2 PUFFS INTO THE LUNGS IN THE MORNING AND AT BEDTIME.   hydrOXYzine  (ATARAX ) 25 MG tablet Take 1 tablet (25 mg total) by mouth 2 (two) times daily as needed for anxiety.   lamoTRIgine  (LAMICTAL ) 25 MG tablet Take 1 tablet (25 mg total) by  mouth daily.   metoprolol  succinate (TOPROL -XL) 100 MG 24 hr tablet Take 1 tablet (100 mg total) by mouth daily. TAKE WITH OR IMMEDIATELY FOLLOWING A MEAL.   omeprazole  (PRILOSEC) 20 MG capsule TAKE 1 CAPSULE BY MOUTH EVERY DAY   [DISCONTINUED] albuterol  (PROVENTIL ) (2.5 MG/3ML) 0.083% nebulizer solution Take 3 mLs (2.5 mg total) by nebulization every 6 (six) hours as needed for wheezing or shortness of breath.    Immunization History  Administered Date(s) Administered   Influenza,inj,Quad PF,6+ Mos 08/23/2014   Influenza-Unspecified 04/16/2019, 04/26/2021, 04/23/2022   Tdap 08/23/2014        Objective:     BP (!) 160/94   Pulse 95   Temp 98.5 F (36.9 C) (Oral)   Ht 5' 2 (1.575 m)   Wt 220 lb (99.8 kg)   SpO2 96%   BMI 40.24 kg/m   SpO2: 96 %  GENERAL: Awake, well-developed, no acute distress, fully ambulatory.  Obese.  No conversational dyspnea. HEAD: Normocephalic, atraumatic.  EYES: Pupils equal, round, reactive to light.  No scleral icterus.  MOUTH: Very poor dentition, missing teeth, oral mucosa moist.  No thrush.SABRA NECK: Supple. No thyromegaly. Trachea midline. No JVD.  No adenopathy. PULMONARY: Good air entry  bilaterally.  No adventitious sounds.   CARDIOVASCULAR: S1 and S2. Regular rate and rhythm.  No rubs, murmurs or gallops heard. ABDOMEN: Benign.   MUSCULOSKELETAL: No joint deformity, no clubbing, no edema.  NEUROLOGIC: No focal deficit, speech is fluent, no gait disturbance. SKIN: Intact,warm,dry.  Ecchymoses and petechiae upper extremities. PSYCH: Normal mood and behavior.       Assessment & Plan:     ICD-10-CM   1. Asthma-COPD overlap syndrome (HCC)  J44.89 Pulmonary function test    2. Chronic ITP (idiopathic thrombocytopenia) (HCC)  D69.3     3. Lung nodule - RLL 5 mm  R91.1     4. Personal history of tobacco use, presenting hazards to health  Z87.891 Ambulatory Referral for Lung Cancer Scre    5. Alcohol use  F10.90     6. Obesity, Class  III, BMI 40-49.9 (morbid obesity)  Z33.186       Orders Placed This Encounter  Procedures   Ambulatory Referral for Lung Cancer Scre    Referral Priority:   Routine    Referral Type:   Consultation    Referral Reason:   Specialty Services Required    Number of Visits Requested:   1   Pulmonary function test    Standing Status:   Future    Expiration Date:   03/11/2025    Where should this test be performed?:   Outpatient Pulmonary    What type of PFT is being ordered?:   Full PFT    Meds ordered this encounter  Medications   albuterol  (PROVENTIL ) (2.5 MG/3ML) 0.083% nebulizer solution    Sig: Take 3 mLs (2.5 mg total) by nebulization every 6 (six) hours as needed for wheezing or shortness of breath.    Dispense:  150 mL    Refill:  1   Discussion:    Chronic obstructive pulmonary disease with asthmatic bronchitis COPD with asthmatic bronchitis. Recent exacerbation in May required hospitalization. Currently on Breztri  with effective symptom control. Lung auscultation reveals clear sounds. - Continue Breztri . - Order annual CT for lung cancer screening (has missed lung cancer screening scans). - Refill albuterol  nebulizer solution. - Perform pulmonary function tests at next visit.  Tobacco use Continues to smoke three cigarettes per day.  Continue efforts to quit smoking completely.  Smoking/Tobacco Cessation Counseling Carly [name] is a current user of tobacco or nicotine  products. Carly [name] is  at this time. Counseling provided today addressed the risks of continued use and the benefits of cessation. Discussed tobacco/nicotine  use history, readiness to quit, and evidence-based treatment options including behavioral strategies, support resources, and pharmacologic therapies. Provided encouragement and educational materials on steps and resources to quit smoking. Patient questions were addressed, and follow-up recommended for continued support. Total time spent on counseling: 5  minutes.  .   Advised if symptoms do not improve or worsen, to please contact office for sooner follow up or seek emergency care.    I spent 40 minutes of dedicated to the care of this patient on the date of this encounter to include pre-visit review of records, face-to-face time with the patient discussing conditions above, [name] visit ordering of testing, clinical documentation with the electronic health record, making appropriate referrals as documented, and communicating necessary findings to members of the patients care team.     C. Leita Sanders, MD Advanced [facility]choscopy PCCM Carly [name]    *This note was generated using voice recognition software/Dragon and/or AI transcription program.  Despite best efforts to proofread,  errors can occur which can change the meaning. Any transcriptional errors that result from this process are unintentional and may not be fully corrected at the time of dictation.

## 2024-03-11 NOTE — Patient Instructions (Signed)
 VISIT SUMMARY:  Carly [name], a 53 year old female with COPD and asthmatic bronchitis, came in for a follow-up visit. She had a severe flu earlier this year, which led to bronchitis and required hospitalization twice. She currently smokes three cigarettes a day and continues to use Breztri  for her condition. She requested a refill for her albuterol  nebulizer solution.  YOUR PLAN:  -CHRONIC OBSTRUCTIVE PULMONARY DISEASE WITH ASTHMATIC BRONCHITIS: COPD with asthmatic bronchitis is a chronic lung condition that makes it hard to breathe and can be worsened by infections like the flu. You should continue using Breztri  as it is helping control your symptoms. We will order an annual CT scan to screen for lung cancer and refill your albuterol  nebulizer solution. Additionally, we will perform pulmonary function tests at your next visit to monitor your lung health.  -TOBACCO USE: Smoking can worsen COPD and other lung conditions. You currently smoke three cigarettes a day. It is important to consider quitting smoking to improve your lung health and overall well-being.  INSTRUCTIONS:  Please continue using Breztri  as prescribed. We will refill your albuterol  nebulizer solution. An annual CT scan for lung cancer screening will be ordered, and pulmonary function tests will be performed at your next visit. Consider reducing or quitting smoking to improve your lung health.

## 2024-03-31 ENCOUNTER — Inpatient Hospital Stay: Admitting: Oncology

## 2024-03-31 ENCOUNTER — Inpatient Hospital Stay

## 2024-04-01 ENCOUNTER — Telehealth: Payer: Self-pay

## 2024-04-01 ENCOUNTER — Telehealth: Payer: Self-pay | Admitting: General Practice

## 2024-04-01 NOTE — Telephone Encounter (Signed)
 Copied from CRM [id number]. Topic: General - Other >> Mar 31, 2024  3:56 PM [place] A wrote: Reason for CRM: Patient called because her FMLA documents are needing to be signed but she does not have appointment until 07/29/24-please contact

## 2024-04-01 NOTE — Telephone Encounter (Signed)
 Can pt be seen by any available provider for paperwork or can we move pt's scheduled toc up?

## 2024-04-01 NOTE — Telephone Encounter (Signed)
 Error

## 2024-04-01 NOTE — Telephone Encounter (Signed)
 Lm to see if patient could do a TOC/FMLA paper work on Sept 26th or Oct 2nd. If patient calls back ask for Darice or Luke.   Patient has stated that she needed a afternoon appointment and she may just decide to keep 04/02/24 appointment with Arnett at 3pm for FMLA paper work.

## 2024-04-01 NOTE — Telephone Encounter (Signed)
 Lm for patient to call office and ask for Darice or Luke. Office wants to move her appointment on 04/02/2024 to Dr Abbey on 04/03/2024 for a  TOC and FMLA paper work

## 2024-04-02 ENCOUNTER — Ambulatory Visit: Admitting: Family

## 2024-04-08 ENCOUNTER — Telehealth: Payer: Self-pay | Admitting: Oncology

## 2024-04-08 ENCOUNTER — Inpatient Hospital Stay

## 2024-04-08 ENCOUNTER — Inpatient Hospital Stay: Admitting: Oncology

## 2024-04-08 NOTE — Telephone Encounter (Signed)
 Pt forgot about appts today and cannot get off of work. Appts have been r/s and new date/times confirmed with pt.

## 2024-04-16 ENCOUNTER — Ambulatory Visit: Payer: Self-pay

## 2024-04-16 ENCOUNTER — Ambulatory Visit (INDEPENDENT_AMBULATORY_CARE_PROVIDER_SITE_OTHER)

## 2024-04-16 VITALS — BP 100/70 | HR 82 | Temp 98.5°F | Ht 62.0 in | Wt 221.6 lb

## 2024-04-16 DIAGNOSIS — K219 Gastro-esophageal reflux disease without esophagitis: Secondary | ICD-10-CM | POA: Diagnosis not present

## 2024-04-16 DIAGNOSIS — E66813 Obesity, class 3: Secondary | ICD-10-CM

## 2024-04-16 DIAGNOSIS — R7989 Other specified abnormal findings of blood chemistry: Secondary | ICD-10-CM | POA: Diagnosis not present

## 2024-04-16 DIAGNOSIS — J4489 Other specified chronic obstructive pulmonary disease: Secondary | ICD-10-CM

## 2024-04-16 DIAGNOSIS — I10 Essential (primary) hypertension: Secondary | ICD-10-CM | POA: Diagnosis not present

## 2024-04-16 DIAGNOSIS — E785 Hyperlipidemia, unspecified: Secondary | ICD-10-CM

## 2024-04-16 DIAGNOSIS — Z1231 Encounter for screening mammogram for malignant neoplasm of breast: Secondary | ICD-10-CM

## 2024-04-16 DIAGNOSIS — M722 Plantar fascial fibromatosis: Secondary | ICD-10-CM

## 2024-04-16 DIAGNOSIS — Z0279 Encounter for issue of other medical certificate: Secondary | ICD-10-CM

## 2024-04-16 DIAGNOSIS — Z720 Tobacco use: Secondary | ICD-10-CM

## 2024-04-16 DIAGNOSIS — E782 Mixed hyperlipidemia: Secondary | ICD-10-CM

## 2024-04-16 DIAGNOSIS — F3181 Bipolar II disorder: Secondary | ICD-10-CM

## 2024-04-16 DIAGNOSIS — R7309 Other abnormal glucose: Secondary | ICD-10-CM

## 2024-04-16 DIAGNOSIS — D693 Immune thrombocytopenic purpura: Secondary | ICD-10-CM

## 2024-04-16 LAB — COMPREHENSIVE METABOLIC PANEL WITH GFR
ALT: 30 U/L (ref 0–35)
AST: 71 U/L — ABNORMAL HIGH (ref 0–37)
Albumin: 3.7 g/dL (ref 3.5–5.2)
Alkaline Phosphatase: 196 U/L — ABNORMAL HIGH (ref 39–117)
BUN: 6 mg/dL (ref 6–23)
CO2: 29 meq/L (ref 19–32)
Calcium: 9.1 mg/dL (ref 8.4–10.5)
Chloride: 98 meq/L (ref 96–112)
Creatinine, Ser: 0.53 mg/dL (ref 0.40–1.20)
GFR: 106.09 mL/min (ref >60.00)
Glucose, Bld: 121 mg/dL — ABNORMAL HIGH (ref 70–99)
Potassium: 4 meq/L (ref 3.5–5.1)
Sodium: 137 meq/L (ref 135–145)
Total Bilirubin: 0.9 mg/dL (ref 0.2–1.2)
Total Protein: 6.8 g/dL (ref 6.0–8.3)

## 2024-04-16 MED ORDER — OMEPRAZOLE 20 MG PO CPDR: 20.0000 mg | DELAYED_RELEASE_CAPSULE | Freq: Every day | ORAL | 3 refills | Status: AC

## 2024-04-16 MED ORDER — METOPROLOL SUCCINATE ER 100 MG PO TB24: 100.0000 mg | ORAL_TABLET | Freq: Every day | ORAL | 3 refills | Status: AC

## 2024-04-16 MED ORDER — VARENICLINE TARTRATE 0.5 MG PO TABS: ORAL_TABLET | ORAL | 0 refills | Status: AC

## 2024-04-16 NOTE — Assessment & Plan Note (Signed)
 Continue follow up with psychiatrist Dr. Jolyn Ferrell. No SI/HI. Phychiatric medications through her psychiatrist.  Reviewed with patient on stress management, recent hospitalization for safety monitoring on 01/12/24 for alchohol induced mood disorder with depressive symptoms (bipolar II without psychotic). She is no longer drinking alcohol. Recommend continued cessation given known history of elevated LFTS.

## 2024-04-16 NOTE — Assessment & Plan Note (Signed)
 Controlled with omeprazole  20 mg daily which she takes on empty stomach. Counseled against use of NSAIDs.  Send refill for omeprazole  to pharmacy.

## 2024-04-16 NOTE — Assessment & Plan Note (Signed)
 Chronic.  Asymptomatic.  Blood pressure within goal.   Refill Metoprolol  XL 100 mg daily done.  Check CMP.

## 2024-04-16 NOTE — Assessment & Plan Note (Signed)
 Chronic condition with stable platelet counts around 50-51. No recent bleeding or bruising. Follow-up with hematologist Dr. Jacobo on October 13th.

## 2024-04-16 NOTE — Patient Instructions (Addendum)
 YOUR MAMMOGRAM IS DUE, PLEASE CALL AND GET THIS SCHEDULED! [facility] - call [phone]   You can try Diclofenac  or voltaran gel 1% up to 4 times a day to your feet to help with pain. You can also try heating/cooling pad to help with pain. Please schedule appointment with your podiatrist.  You can try Tylenol  500 mg twice a day as needed when pain is worse.   Chantix  for smoking cessation: For first 3 days: 0.5 mg once daily. For the next four days: 0.5 mg twice daily. Day 8 onward: 1 mg twice daily.   Call [employer]uit[employer] Get free tobacco cessation help 24/7 in several ways:  1-800-QUIT-NOW 401-781-9333); Espaol: 1-855-Djelo-Ya (1-(551)348-2467) o para ms informacin haga clic aqu ; Interpretation services available for many languages; Register online ( en espaol ) TTY: (863) 395-7207 [employer] [place] Quitline: Call 888-7AI-QUIT (682)025-5044)   Diet: Emphasize whole grains, lean proteins, fruits, and vegetables. Limit processed foods and sugary drinks. Exercise: Aim for 150 minutes of moderate aerobic activity weekly plus strength training twice a week. Try with 5-10 minutes of walking as tolerated and increase exercise as you built stamina.  Weight Loss: Target 5%    You qualify for shingles vaccine, pneumonia, influenza vaccine, you can update these through your local pharmacy.

## 2024-04-16 NOTE — Assessment & Plan Note (Signed)
 Reviewed labs from recent hospitalization for safety monitoring on 01/12/24 for alchohol induced mood disorder. She is no longer drinking alcohol. Recommend continued cessation given known history of elevated LFTS.  Repeat CMP. Patient asymptomatic, no RUQ pain, diarrhea.

## 2024-04-16 NOTE — Assessment & Plan Note (Signed)
 Managed with podiatrist visits. Pain in heels, exacerbated by standing. Given h/o GERD Ibuprofen  advised against. - Recommend Voltaren  gel 1% up to four times daily for pain relief. - Suggest Tylenol  500 mg twice daily as needed for pain. - Encourage use of heating/cooling pads for pain management. - Schedule follow-up with podiatrist for further management.

## 2024-04-16 NOTE — Assessment & Plan Note (Signed)
 Patient has been smoking since age 53, intermittently reduced amount of cigarettes she has smoked. She is back to smoking about 1 pack of cigarettes now. Has done well on chantix  in the past to help with smoking cessation.  Prescribe Chantix  with titration schedule: 0.5 mg once daily for days 1-3, 0.5 mg twice daily for days 4-7, then 1 mg twice daily for 30 days. Provide information on Quitline for additional support. F/u in 1 month for annual physical and reevaluation.

## 2024-04-16 NOTE — Assessment & Plan Note (Signed)
 Continue f/u with pulmonologist Dr. Tamea. Symptoms stable on rescue Albuterol  nebulizer and Breztri  for maintenance.

## 2024-04-16 NOTE — Telephone Encounter (Signed)
Pt picked up paperwork today.

## 2024-04-16 NOTE — Progress Notes (Signed)
 Established Patient Office Visit   Subjective  Patient ID: Carly [name], female    DOB: Aug 15, 1970  Age: 53 y.o. MRN: [id number]  Chief Complaint  Patient presents with   Establish Care   Foot Pain   Nicotine  Dependence    She  has a past medical history of Abnormal uterine bleeding (AUB) (08/26/2018), Alcohol abuse (01/11/2024), Alcohol-induced mood disorder with depressive symptoms (HCC) (01/11/2024), Anxiety, C. difficile diarrhea, Chicken pox, Depression, Depression, major, single episode, moderate (HCC) (08/04/2018), Diarrhea (06/07/2022), Dyspnea on exertion (05/24/2022), Elevated testosterone  level in female, Emphysema of lung (HCC), Frequent headaches, Generalized anxiety disorder (08/23/2014), Hay fever, Hypertension, Idiopathic thrombocytopenic purpura (ITP) (HCC), Iron deficiency (09/02/2017), Positive ANA (antinuclear antibody), Tachycardia (09/09/2022), and Thrombocytopenia (02/12/2016).  HPI Discussed the use of AI scribe software for clinical note transcription with the patient, who gave verbal consent to proceed.  History of Present Illness Carly [name] is a 53 year old female with thrombocytopenia, bipolar disorder, and COPD who presents for TOC from previous PCP, multiple chronic medication management requesting FMLA paperwork filled out.   She has a history of thrombocytopenia with platelet counts previously as low as 27,000. Prednisone  has been used in the past to manage low platelet counts. Her last lab results in August showed platelet counts within a stable range of 50,000 to 51,000. She is scheduled for further evaluation in October. No unusual bleeding or bruising is reported.  She has a history of bipolar disorder and is currently under the care of an online psychiatrist. Her current medications include Lexapro  10 mg, Abilify  5 mg, Lamictal  100 mg, and hydroxyzine  25 mg once daily. No thoughts of self-harm or harm to others are reported.  She has a history of  COPD and asthma, using albuterol  as a rescue inhaler and a nebulizer for maintenance therapy. She has been on FMLA in the past due to COPD flare-ups.  She has a history of elevated liver enzymes related to alcohol use, with significant elevation noted during a hospitalization in June. She reports abstaining from alcohol since her discharge from behavioral health in June of earlier this year.   Her A1c has been in the prediabetic range in the past, but the last check in July was within normal limits. She reports a sedentary lifestyle, standing in one place at work, and experiencing leg and foot pain after work, which limits her physical activity.  She has a history of smoking since age 33 and currently smokes about a pack a day, increased from three cigarettes daily after her husband was diagnosed with dementia. She has previously tried Chantix  for smoking cessation, which was effective without side effects.  She takes metoprolol  100 mg once daily for blood pressure management, which she has not been monitoring at home. She also takes an acid-reducing medication daily on an empty stomach to manage heartburn, which is effective as long as she adheres to this regimen.  She has experienced foot pain, particularly in her heels, and has seen a podiatrist for injections in the past. She plans to make an appointment for further treatment.  She reports of needing FMLA time off to go to doctors office. Gets time off when she has exacerbation of COPD.    ROS As per HPI    Objective:     BP 100/70 (BP Location: Right Arm, Patient Position: Sitting, Cuff Size: Normal)   Pulse 82   Temp 98.5 F (36.9 C) (Oral)   Ht 5' 2 (1.575 m)  Wt 221 lb 9.6 oz (100.5 kg)   SpO2 96%   BMI 40.53 kg/m      04/16/2024    9:11 AM 08/20/2023    3:54 PM 04/08/2023   11:05 AM  Depression screen PHQ 2/9  Decreased Interest 0 0 0  Down, Depressed, Hopeless 0 0 0  PHQ - 2 Score 0 0 0  Altered sleeping 2 0 0  Tired,  decreased energy 3 0 3  Change in appetite 0 0 0  Feeling bad or failure about yourself  0 0 0  Trouble concentrating 0 0 0  Moving slowly or fidgety/restless 0 0 0  Suicidal thoughts 0 0 0  PHQ-9 Score 5 0 3  Difficult doing work/chores  Not difficult at all Not difficult at all      04/16/2024    9:11 AM 08/20/2023    3:54 PM 04/08/2023   11:06 AM 03/20/2023    1:37 PM  GAD 7 : Generalized Anxiety Score  Nervous, Anxious, on Edge 0 0 0 0  Control/stop worrying 0 0 0 0  Worry too much - different things 0 0 0 0  Trouble relaxing 0 0 0 0  Restless 0 0 0 0  Easily annoyed or irritable 0 0 0 0  Afraid - awful might happen 0 0 0 0  Total GAD 7 Score 0 0 0 0  Anxiety Difficulty Not difficult at all Not difficult at all Not difficult at all Not difficult at all      04/16/2024    9:11 AM 08/20/2023    3:54 PM 04/08/2023   11:05 AM  Depression screen PHQ 2/9  Decreased Interest 0 0 0  Down, Depressed, Hopeless 0 0 0  PHQ - 2 Score 0 0 0  Altered sleeping 2 0 0  Tired, decreased energy 3 0 3  Change in appetite 0 0 0  Feeling bad or failure about yourself  0 0 0  Trouble concentrating 0 0 0  Moving slowly or fidgety/restless 0 0 0  Suicidal thoughts 0 0 0  PHQ-9 Score 5 0 3  Difficult doing work/chores  Not difficult at all Not difficult at all      04/16/2024    9:11 AM 08/20/2023    3:54 PM 04/08/2023   11:06 AM 03/20/2023    1:37 PM  GAD 7 : Generalized Anxiety Score  Nervous, Anxious, on Edge 0 0 0 0  Control/stop worrying 0 0 0 0  Worry too much - different things 0 0 0 0  Trouble relaxing 0 0 0 0  Restless 0 0 0 0  Easily annoyed or irritable 0 0 0 0  Afraid - awful might happen 0 0 0 0  Total GAD 7 Score 0 0 0 0  Anxiety Difficulty Not difficult at all Not difficult at all Not difficult at all Not difficult at all   SDOH Screenings   Food Insecurity: No Food Insecurity (01/16/2024)  Housing: Unknown (01/16/2024)  Transportation Needs: No Transportation Needs (01/16/2024)   Utilities: Not At Risk (01/16/2024)  Alcohol Screen: Medium Risk (01/11/2024)  Depression (PHQ2-9): Medium Risk (04/16/2024)  Social Connections: Moderately Isolated (08/07/2023)  Tobacco Use: High Risk (04/16/2024)     Physical Exam Constitutional:      Appearance: She is obese.  HENT:     Head: Normocephalic and atraumatic.     Mouth/Throat:     Mouth: Mucous membranes are moist.     Dentition: Abnormal dentition.  Cardiovascular:  Rate and Rhythm: Normal rate.  Pulmonary:     Effort: Pulmonary effort is normal.     Breath sounds: Normal breath sounds.  Abdominal:     General: Abdomen is protuberant. Bowel sounds are normal.     Palpations: Abdomen is soft.     Tenderness: There is no guarding.  Musculoskeletal:     Cervical back: Neck supple.     Right lower leg: No edema.     Left lower leg: No edema.  Lymphadenopathy:     Cervical: No cervical adenopathy.  Skin:    General: Skin is warm.  Neurological:     Mental Status: She is alert and oriented to person, place, and time.  Psychiatric:        Mood and Affect: Mood normal.        Behavior: Behavior normal.        No results found for any visits on 04/16/24.  The 10-year ASCVD risk score (Arnett DK, et al., 2019) is: 4.6%     Assessment & Plan:  Advise f/u in 4 weeks for annual physical.  FMLA form reviewed. Has been getting this since10/17/2022 due to asthma, copd, ITP with recurrent flare ups, scheduled follow up to haemato-oncology,pulmonology. FMLA form filled, we will scan a copy to patient's medical record and have her pick up original paperwork.    Essential hypertension -     Metoprolol  Succinate ER; Take 1 tablet (100 mg total) by mouth daily. TAKE WITH OR IMMEDIATELY FOLLOWING A MEAL.  Dispense: 90 tablet; Refill: 3 -     Comprehensive metabolic panel with GFR  Gastroesophageal reflux disease without esophagitis Assessment & Plan: Controlled with omeprazole  20 mg daily which she takes on  empty stomach. Counseled against use of NSAIDs.  Send refill for omeprazole  to pharmacy.  Orders: -     Omeprazole ; Take 1 capsule (20 mg total) by mouth daily.  Dispense: 90 capsule; Refill: 3  Elevated LFTs Assessment & Plan: Reviewed labs from recent hospitalization for safety monitoring on 01/12/24 for alchohol induced mood disorder. She is no longer drinking alcohol. Recommend continued cessation given known history of elevated LFTS.  Repeat CMP. Patient asymptomatic, no RUQ pain, diarrhea.    Encounter for screening mammogram for breast cancer Assessment & Plan: Order mammogram and provide phone number for scheduling.  Orders: -     3D Screening Mammogram, Left and Right; Future  Tobacco abuse Assessment & Plan: Patient has been smoking since age 59, intermittently reduced amount of cigarettes she has smoked. She is back to smoking about 1 pack of cigarettes now. Has done well on chantix  in the past to help with smoking cessation.  Prescribe Chantix  with titration schedule: 0.5 mg once daily for days 1-3, 0.5 mg twice daily for days 4-7, then 1 mg twice daily for 30 days. Provide information on Quitline for additional support. F/u in 1 month for annual physical and reevaluation.    Orders: -     Varenicline  Tartrate; Take 1 tablet (0.5 mg total) by mouth daily for 3 days, THEN 1 tablet (0.5 mg total) 2 (two) times daily for 4 days, THEN 2 tablets (1 mg total) 2 (two) times daily.  Dispense: 131 tablet; Refill: 0  Plantar fasciitis, bilateral Assessment & Plan: Managed with podiatrist visits. Pain in heels, exacerbated by standing. Given h/o GERD Ibuprofen  advised against. - Recommend Voltaren  gel 1% up to four times daily for pain relief. - Suggest Tylenol  500 mg twice daily as needed for  pain. - Encourage use of heating/cooling pads for pain management. - Schedule follow-up with podiatrist for further management.   Obesity, Class III, BMI 40-49.9 (morbid obesity)  (HCC) Assessment & Plan: Diet: Emphasize whole grains, lean proteins, fruits, and vegetables. Limit processed foods and sugary drinks. Exercise: Aim for 150 minutes of moderate aerobic activity weekly plus strength training twice a week. Try with 5-10 minutes of walking as tolerated and increase exercise as you built stamina.  Weight Loss: Target 5-10%.    Mixed hyperlipidemia Assessment & Plan: The 10-year ASCVD risk score (Arnett DK, et al., 2019) is: 4.6%   Values used to calculate the score:     Age: 2 years     Clincally relevant sex: Female     Is Non-Hispanic African American: No     Diabetic: No     Tobacco smoker: Yes     Systolic Blood Pressure: 100 mmHg     Is BP treated: Yes     HDL Cholesterol: 34 mg/dL     Total Cholesterol: 170 mg/dL Continue risk factor management including lifestyle modifications. Encourage regular exercise, starting with 5-10 minutes of walking daily, aiming for 150 minutes of moderate activity weekly. Reviewed following labs: not a candidate for statin Lab Results  Component Value Date   CHOL 170 01/14/2024   HDL 34 (L) 01/14/2024   LDLCALC 118 (H) 01/14/2024   LDLDIRECT 104.0 09/02/2017   TRIG 91 01/14/2024   CHOLHDL 5.0 01/14/2024      Primary hypertension Assessment & Plan: Chronic.  Asymptomatic.  Blood pressure within goal.   Refill Metoprolol  XL 100 mg daily done.  Check CMP.    Abnormal glucose Assessment & Plan: Lab Results  Component Value Date   HGBA1C 5.4 01/14/2024   Previously has been on prediabetic range.  Monitor A1c periodically, no immediate need for repeat testing.   Asthma-COPD overlap syndrome [facility]) Assessment & Plan: Continue f/u with pulmonologist Dr. Tamea. Symptoms stable on rescue Albuterol  nebulizer and Breztri  for maintenance.    Chronic ITP (idiopathic thrombocytopenia) (HCC) Assessment & Plan: Chronic condition with stable platelet counts around 50-51. No recent bleeding or bruising.  Follow-up with hematologist Dr. Jacobo on October 13th.   Bipolar 2 disorder [facility]) Assessment & Plan: Continue follow up with psychiatrist Dr. Jolyn Ferrell. No SI/HI. Phychiatric medications through her psychiatrist.  Reviewed with patient on stress management, recent hospitalization for safety monitoring on 01/12/24 for alchohol induced mood disorder with depressive symptoms (bipolar II without psychotic). She is no longer drinking alcohol. Recommend continued cessation given known history of elevated LFTS.      Return in about 4 weeks (around 05/14/2024) for Annual physical exam .   Isai Gottlieb, MD

## 2024-04-16 NOTE — Assessment & Plan Note (Signed)
 Order mammogram and provide phone number for scheduling.

## 2024-04-16 NOTE — Assessment & Plan Note (Signed)
 Lab Results  Component Value Date   HGBA1C 5.4 01/14/2024   Previously has been on prediabetic range.  Monitor A1c periodically, no immediate need for repeat testing.

## 2024-04-16 NOTE — Assessment & Plan Note (Signed)
 The 10-year ASCVD risk score (Arnett DK, et al., 2019) is: 4.6%   Values used to calculate the score:     Age: 53 years     Clincally relevant sex: Female     Is Non-Hispanic African American: No     Diabetic: No     Tobacco smoker: Yes     Systolic Blood Pressure: 100 mmHg     Is BP treated: Yes     HDL Cholesterol: 34 mg/dL     Total Cholesterol: 170 mg/dL Continue risk factor management including lifestyle modifications. Encourage regular exercise, starting with 5-10 minutes of walking daily, aiming for 150 minutes of moderate activity weekly. Reviewed following labs: not a candidate for statin Lab Results  Component Value Date   CHOL 170 01/14/2024   HDL 34 (L) 01/14/2024   LDLCALC 118 (H) 01/14/2024   LDLDIRECT 104.0 09/02/2017   TRIG 91 01/14/2024   CHOLHDL 5.0 01/14/2024

## 2024-04-16 NOTE — Assessment & Plan Note (Signed)
 Diet: Emphasize whole grains, lean proteins, fruits, and vegetables. Limit processed foods and sugary drinks. Exercise: Aim for 150 minutes of moderate aerobic activity weekly plus strength training twice a week. Try with 5-10 minutes of walking as tolerated and increase exercise as you built stamina.  Weight Loss: Target 5-10%.

## 2024-04-20 ENCOUNTER — Encounter

## 2024-04-21 IMAGING — CR DG KNEE COMPLETE 4+V*R*
1 series · 4 of 4 positions shown · non-contrast
Comparison: None.

CLINICAL DATA: Bilateral knee pain.

EXAM:
RIGHT KNEE - COMPLETE 4+ VIEW

[Series 1: dg knee complete 4 views right · 0.14mm/px · 4 of 4 slices shown]
[im 1/4]
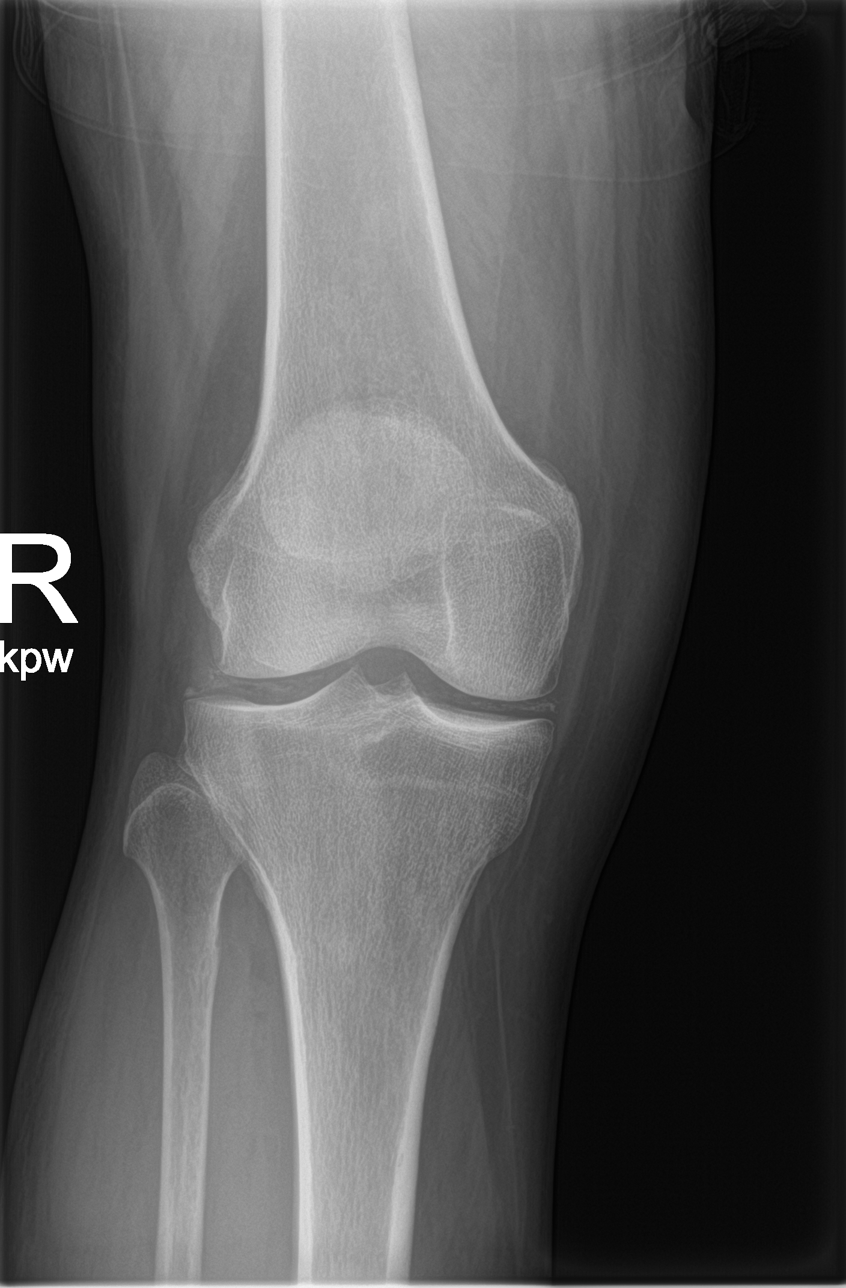
[im 2/4]
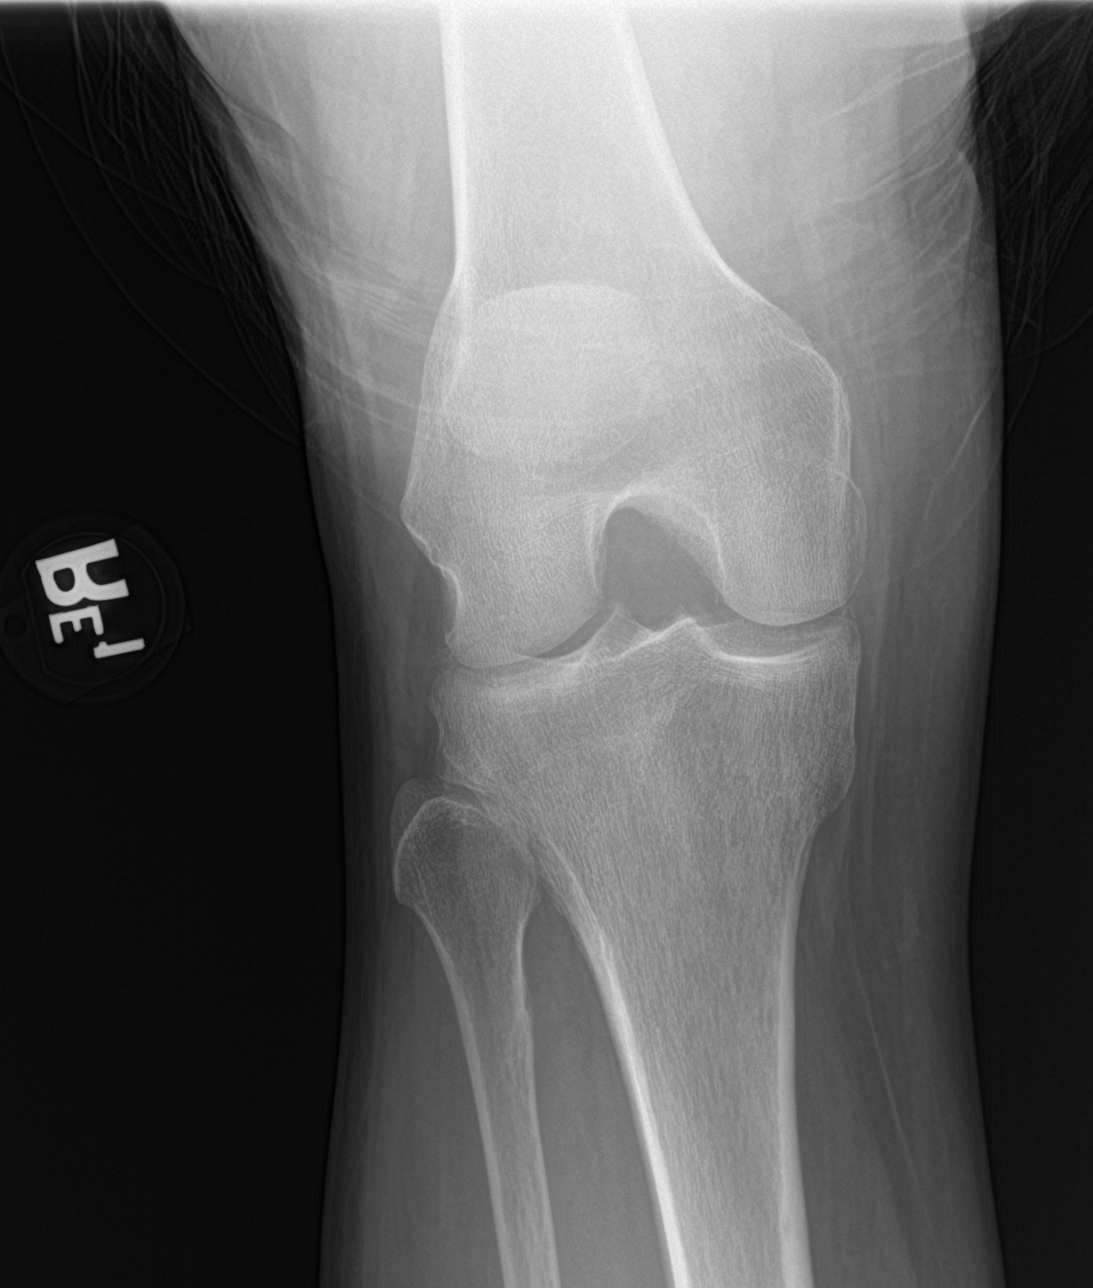
[im 3/4]
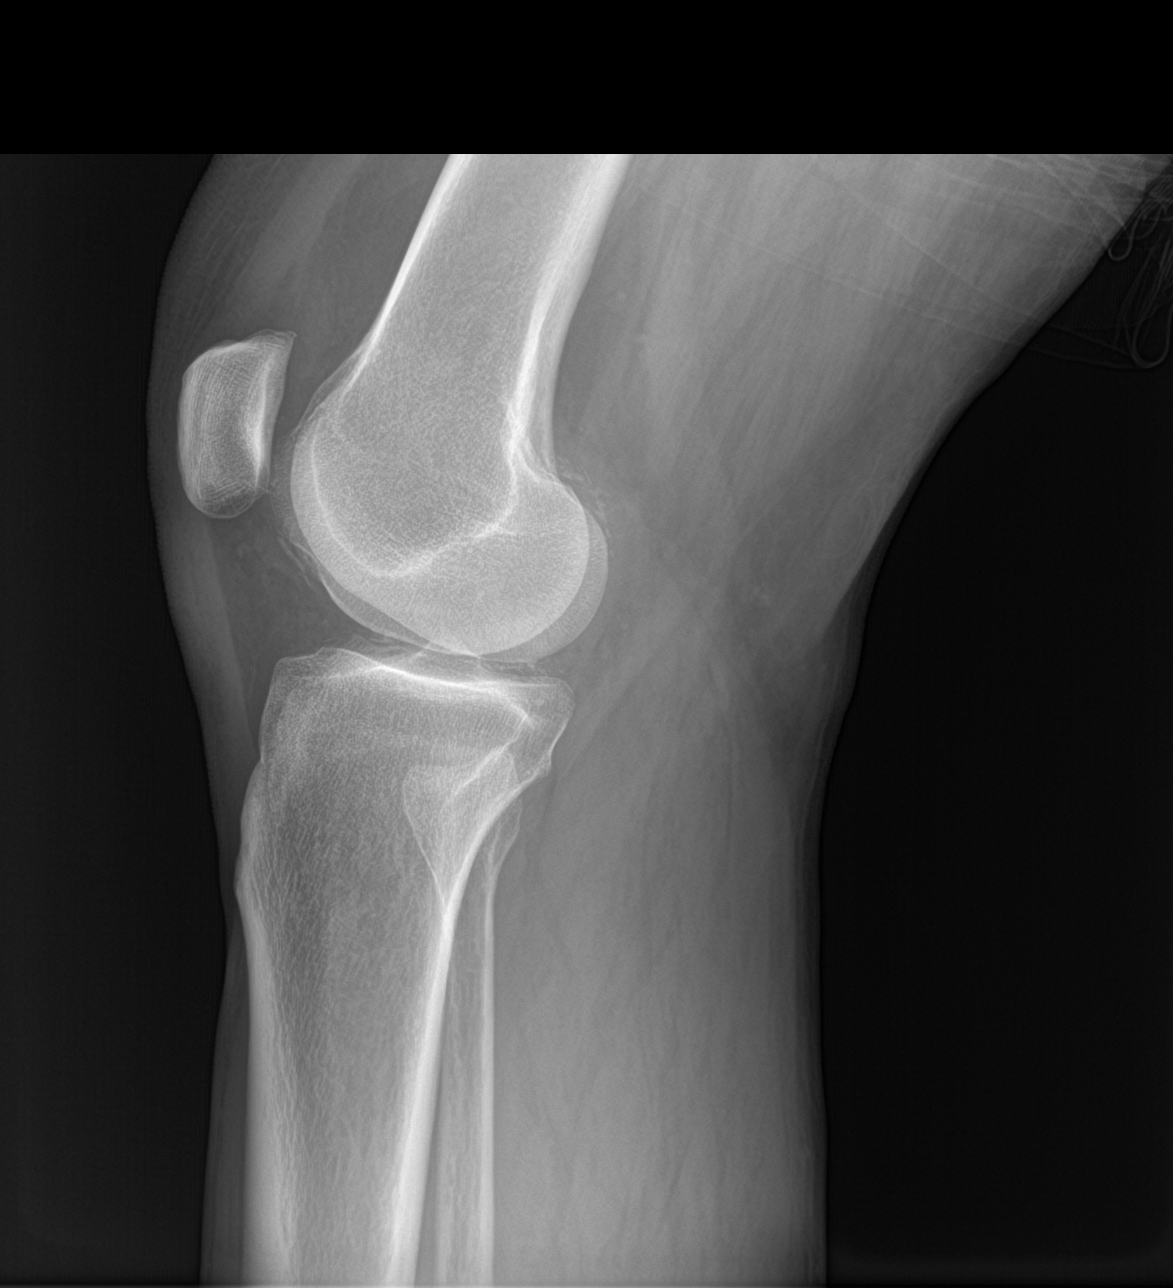
[im 4/4]
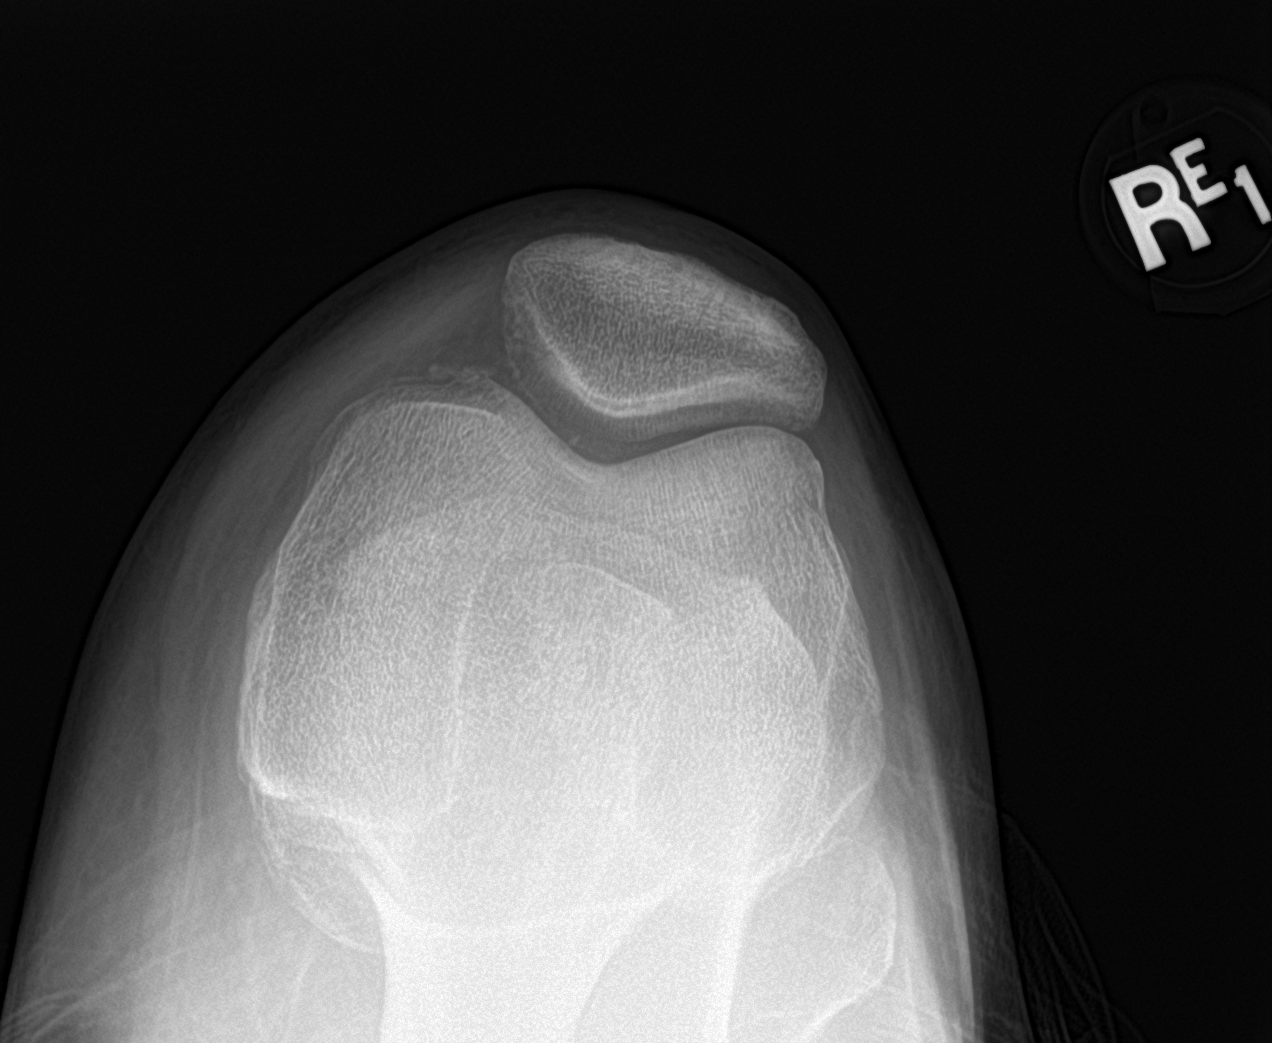

[4 of 4 positions shown; findings below may reference images not displayed]

FINDINGS: No evidence of an acute fracture or dislocation. Mild to moderate
severity medial and lateral chondrocalcinosis is noted. A very small
joint effusion is seen.
IMPRESSION: 1. No acute osseous abnormality.
2. Chondrocalcinosis.
3. Very small joint effusion.

## 2024-04-21 IMAGING — CR DG KNEE COMPLETE 4+V*L*
1 series · 4 of 4 positions shown · non-contrast
Comparison: None.

CLINICAL DATA: Bilateral knee pain.

EXAM:
LEFT KNEE - COMPLETE 4+ VIEW

[Series 1: dg knee complete 4 views left · 0.14mm/px · 4 of 4 slices shown]
[im 1/4]
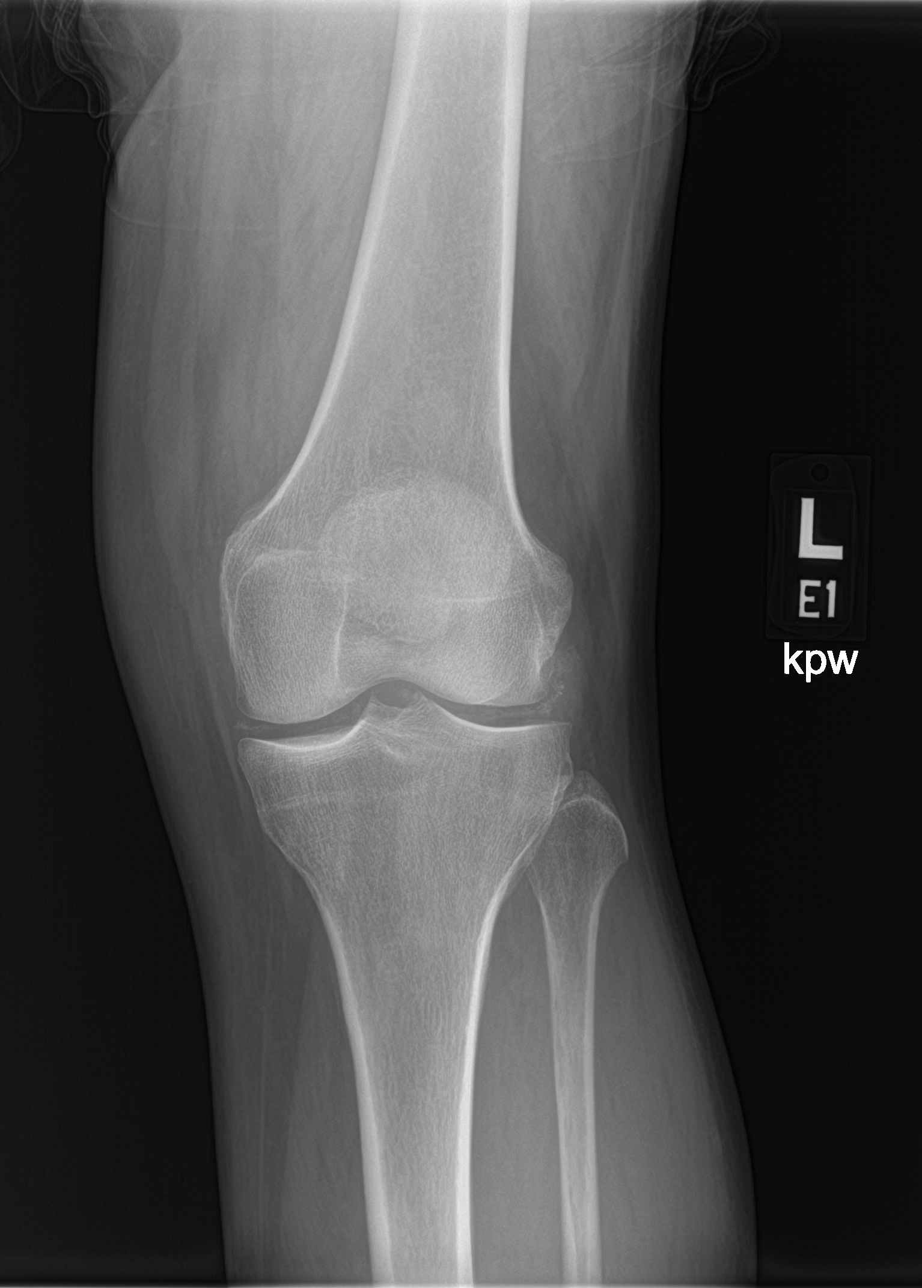
[im 2/4]
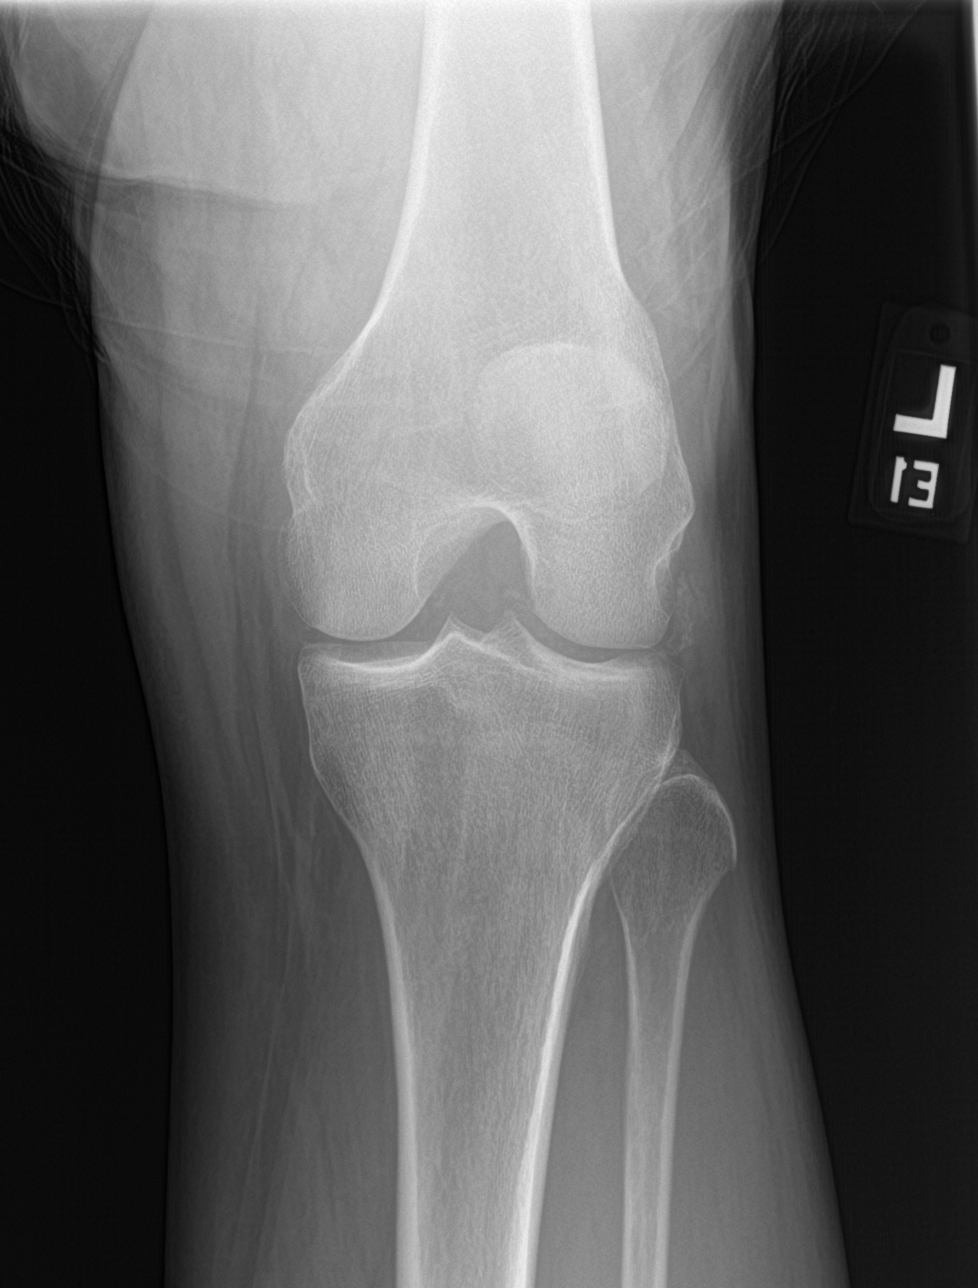
[im 3/4]
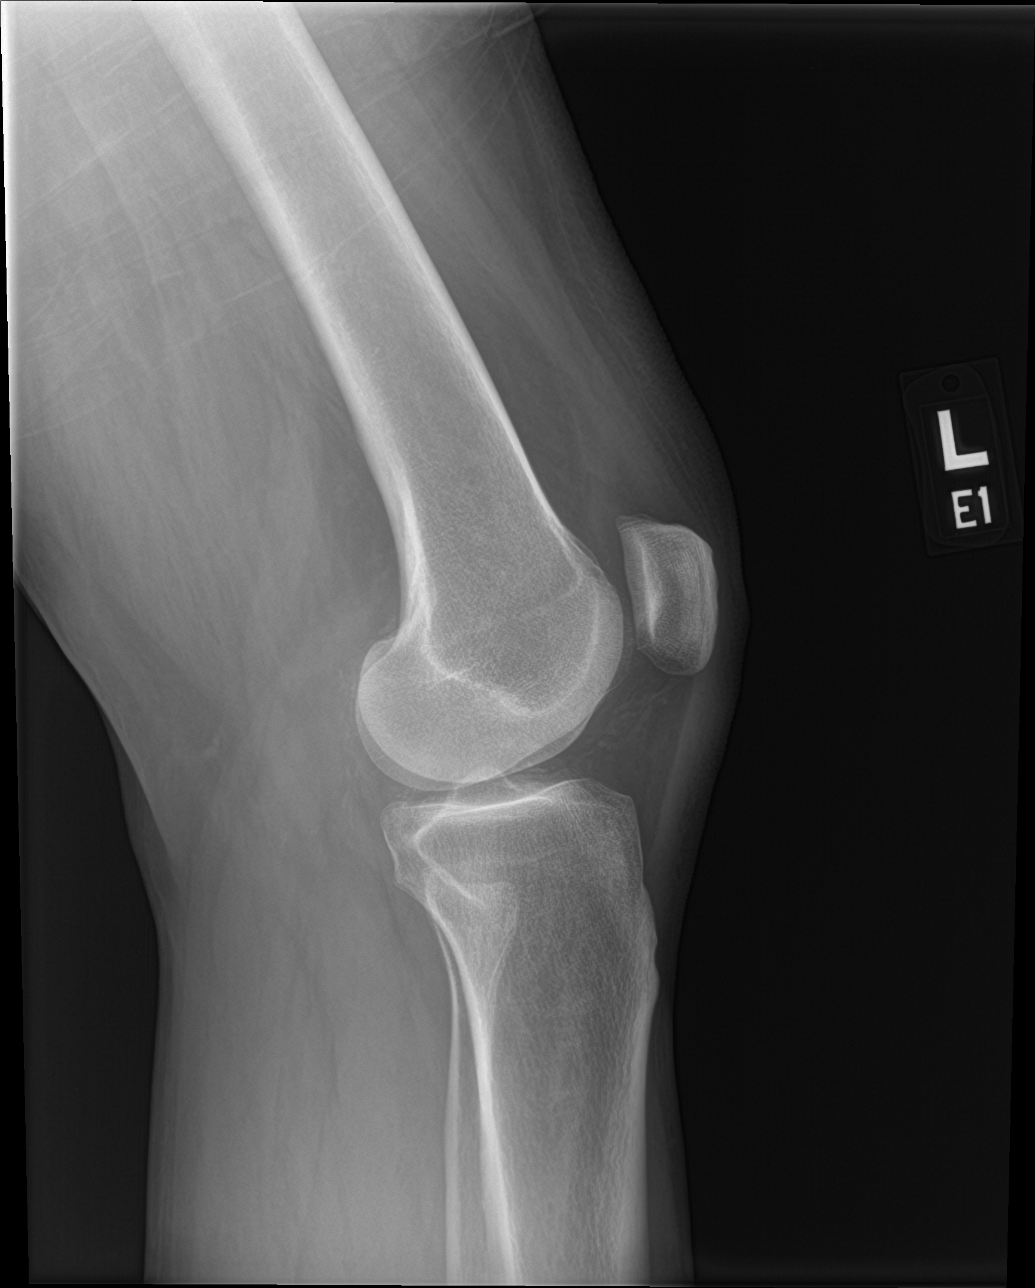
[im 4/4]
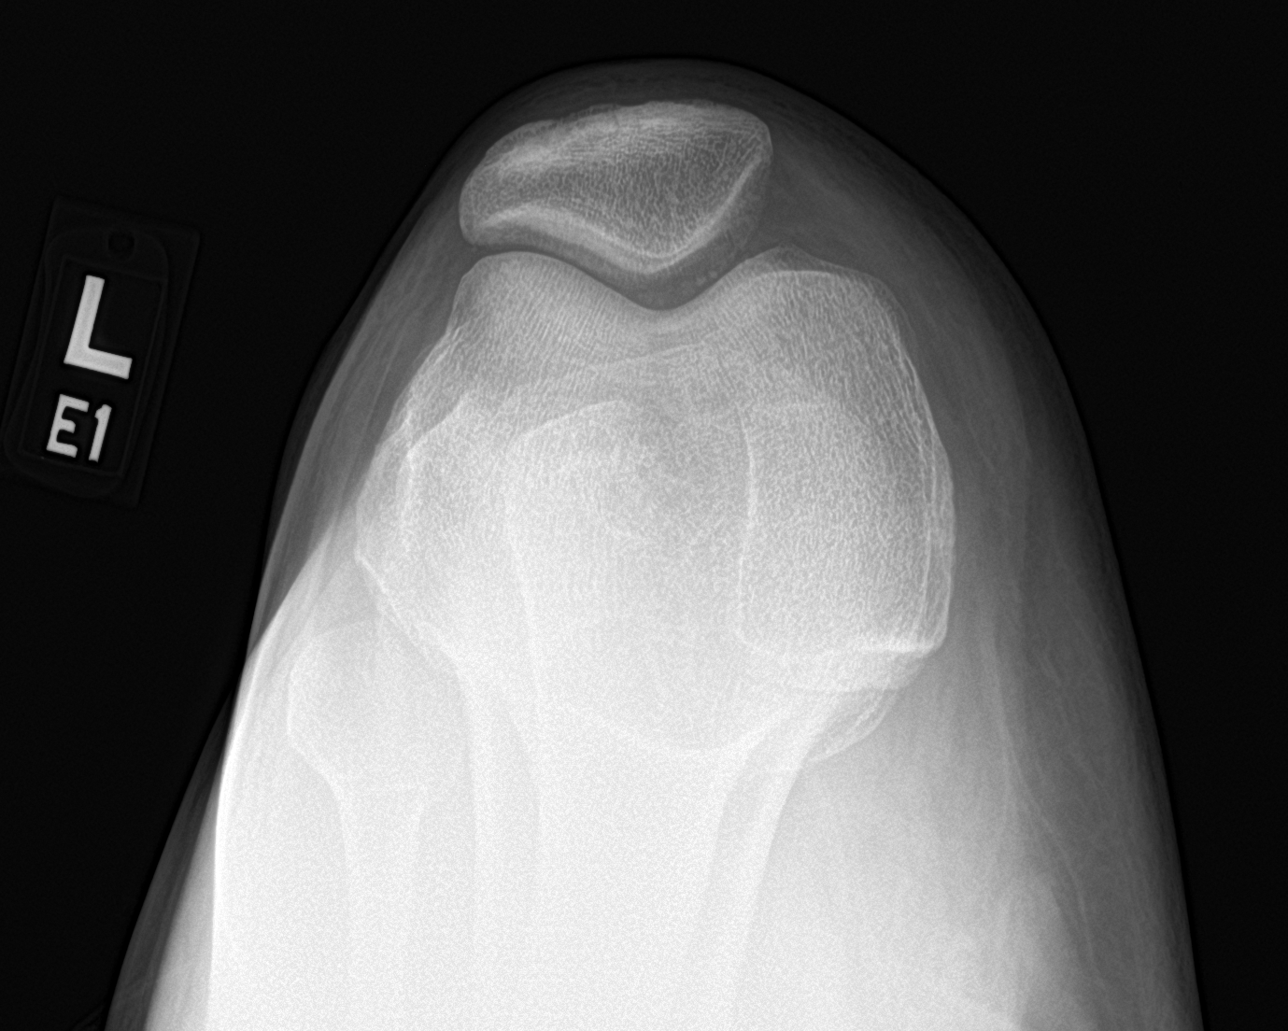

[4 of 4 positions shown; findings below may reference images not displayed]

FINDINGS: No evidence of an acute fracture or dislocation. Mild medial and
lateral chondrocalcinosis is noted. A small joint effusion is noted.
IMPRESSION: 1. No evidence of an acute osseous abnormality.
2. Mild chondrocalcinosis.
3. Small joint effusion.

## 2024-04-21 IMAGING — US US EXTREM LOW VENOUS*L*
1 series · 14 of 24 positions shown · non-contrast
Comparison: None.

CLINICAL DATA: left posterior knee pain r/o bakers cyst vs other

EXAM:
LEFT LOWER EXTREMITY VENOUS DOPPLER ULTRASOUND
TECHNIQUE: Gray-scale sonography with compression, as well as color and duplex
ultrasound, were performed to evaluate the deep venous system(s)
from the level of the common femoral vein through the popliteal and
proximal calf veins.

[Series 1: us venous img lower uni left (dvt) · portal-venous · 14 of 39 slices shown]
[im 1/39]
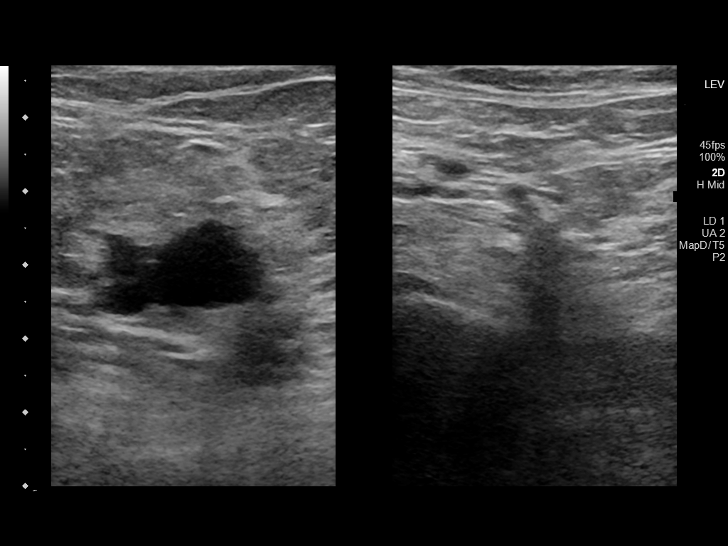
[im 4/39]
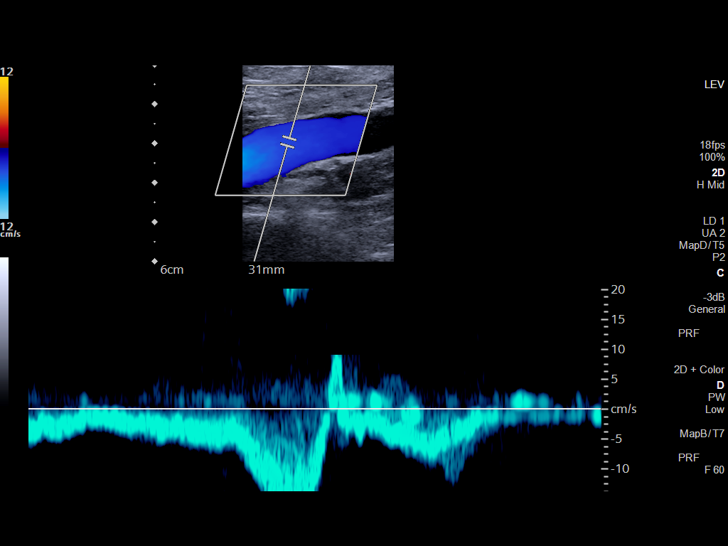
[im 7/39]
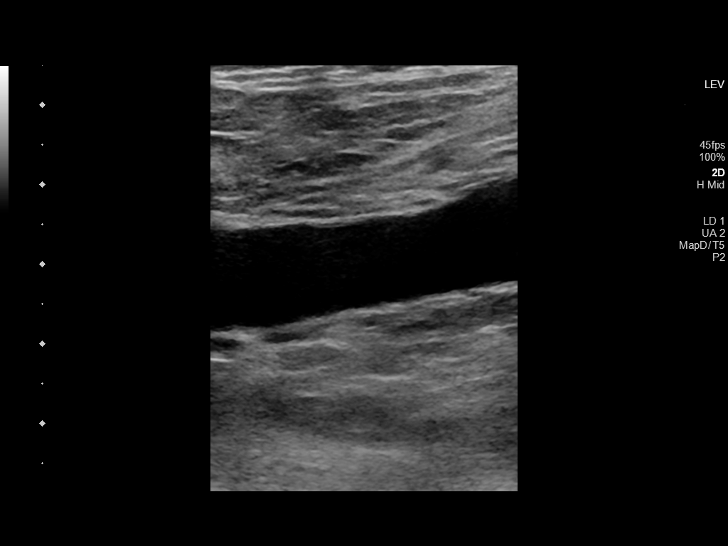
[im 10/39]
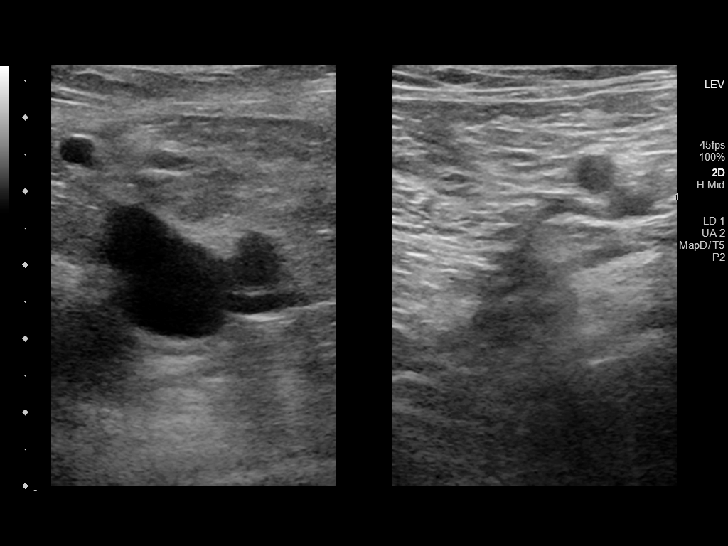
[im 12/39]
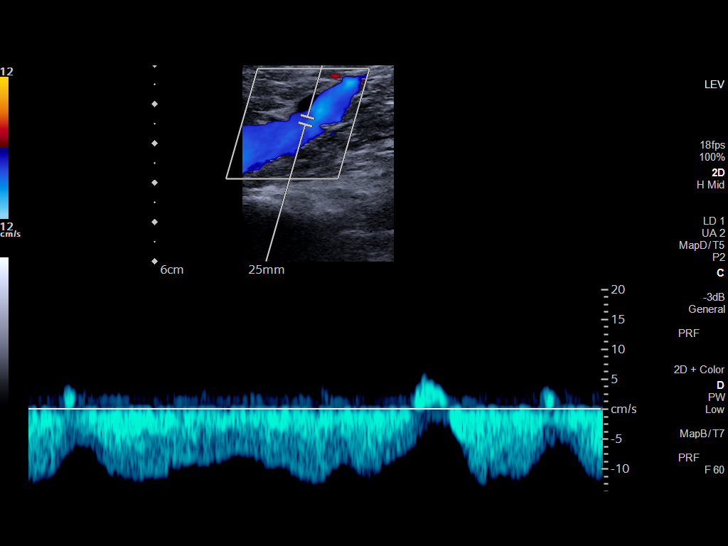
[im 15/39]
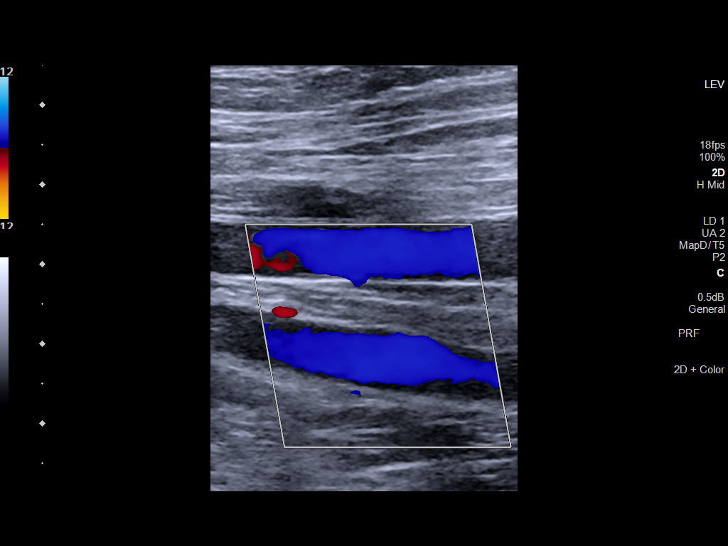
[im 19/39]
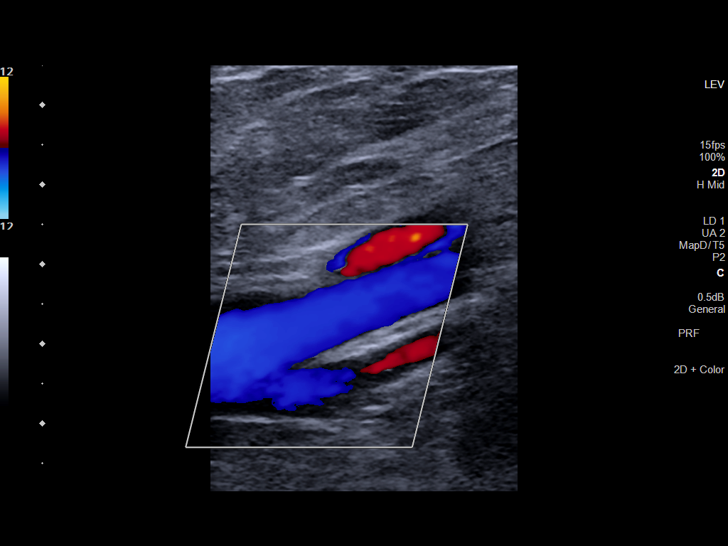
[im 20/39]
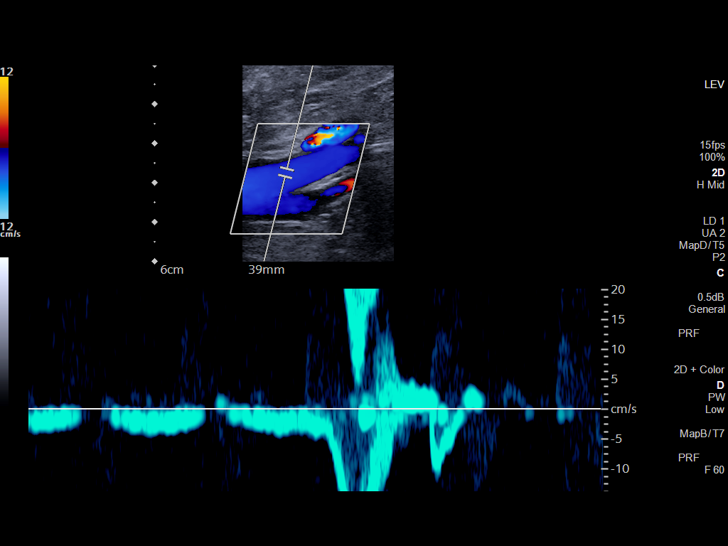
[im 24/39]
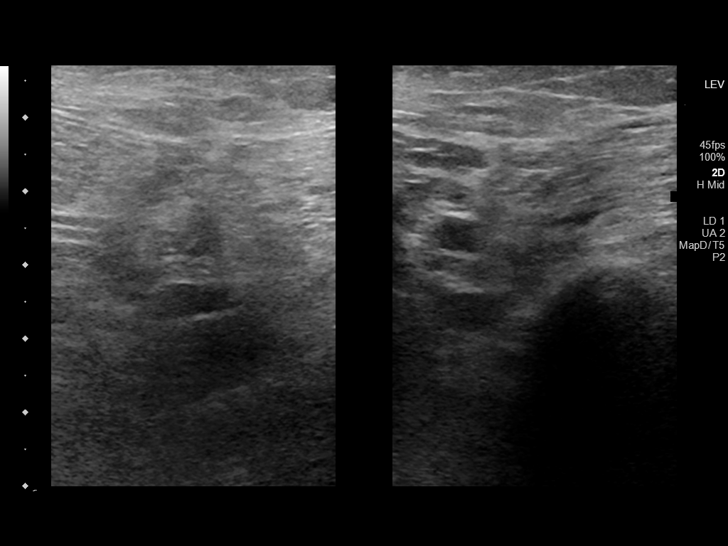
[im 27/39]
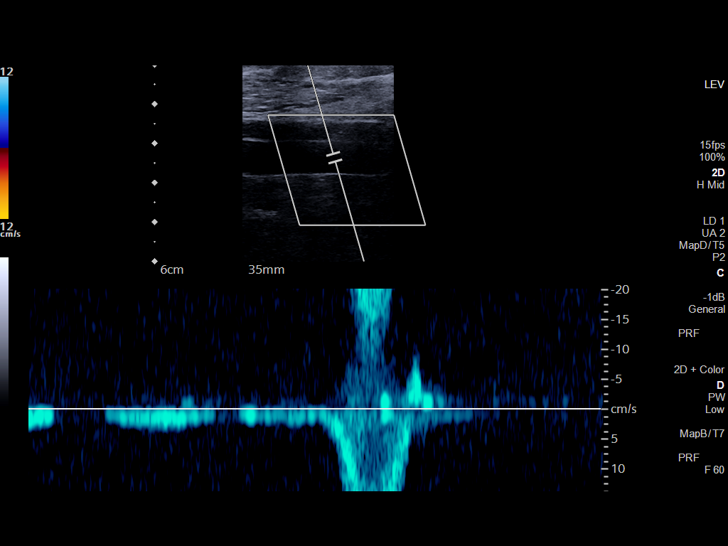
[im 30/39]
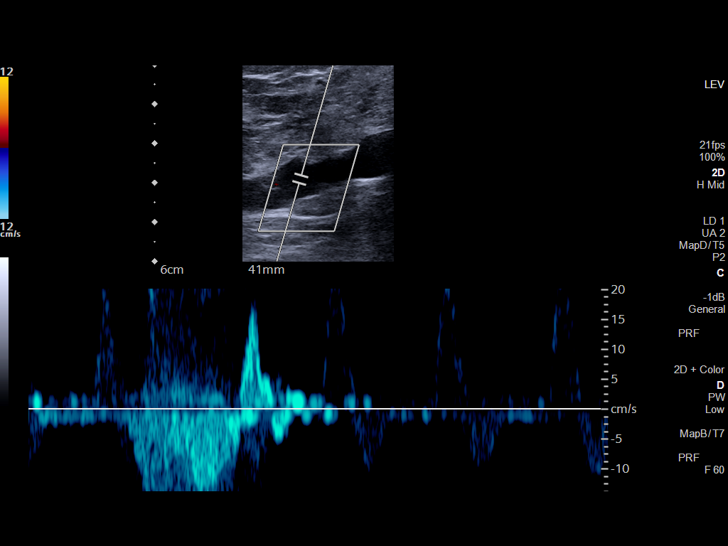
[im 32/39]
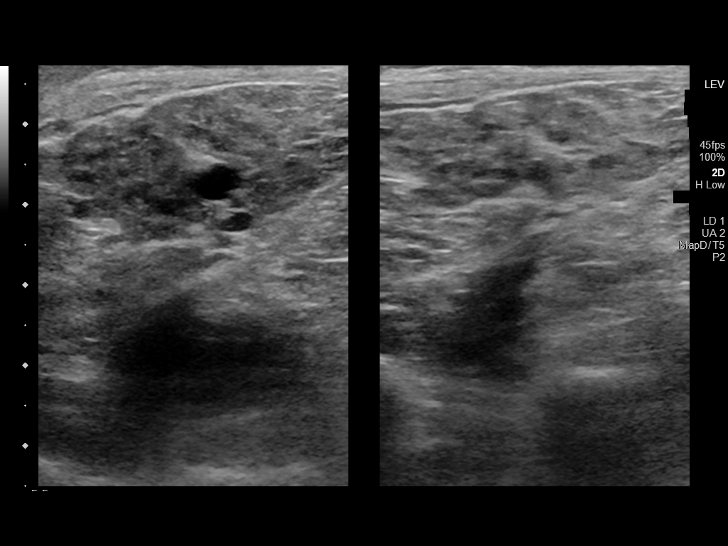
[im 35/39]
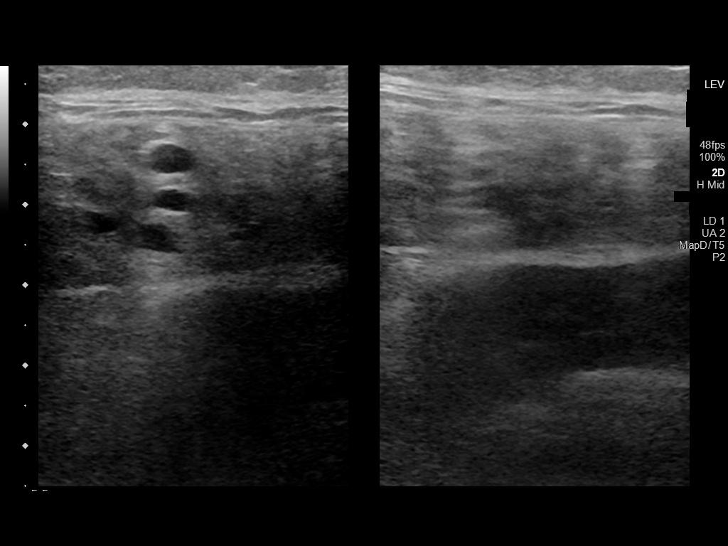
[im 39/39]
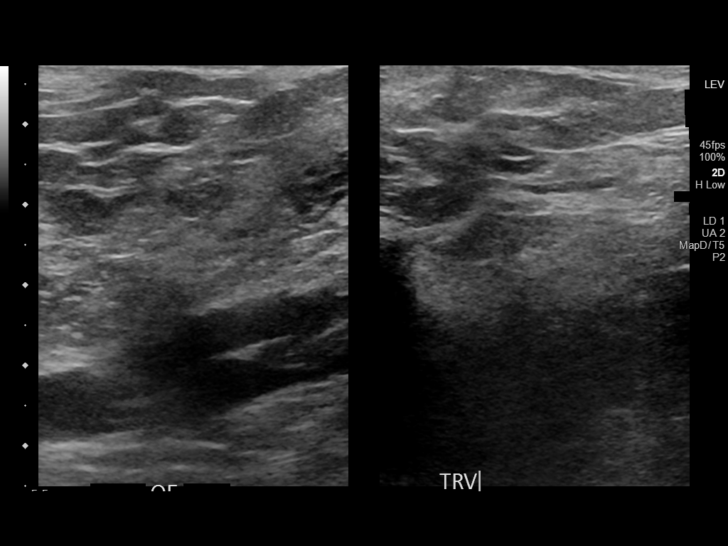

[14 of 24 positions shown; findings below may reference images not displayed]

FINDINGS: VENOUS

Normal compressibility of the common femoral, superficial femoral,
and popliteal veins, as well as the visualized calf veins.
Visualized portions of profunda femoral vein and great saphenous
vein unremarkable. No filling defects to suggest DVT on grayscale or
color Doppler imaging. Doppler waveforms show normal direction of
venous flow, normal respiratory plasticity and response to
augmentation.

Limited views of the contralateral common femoral vein are
unremarkable.
IMPRESSION: No evidence of DVT in the left lower extremity.

## 2024-04-23 NOTE — Progress Notes (Signed)
 This encounter was created in error - please disregard.

## 2024-04-27 ENCOUNTER — Inpatient Hospital Stay: Admitting: Oncology

## 2024-04-27 ENCOUNTER — Inpatient Hospital Stay

## 2024-04-30 ENCOUNTER — Other Ambulatory Visit: Payer: Self-pay | Admitting: Pulmonary Disease

## 2024-04-30 ENCOUNTER — Other Ambulatory Visit: Payer: Self-pay

## 2024-04-30 ENCOUNTER — Emergency Department
Admission: EM | Admit: 2024-04-30 | Discharge: 2024-04-30 | Disposition: A | Discharge status: Home / Self Care | Attending: Emergency Medicine | Admitting: Emergency Medicine

## 2024-04-30 DIAGNOSIS — R55 Syncope and collapse: Secondary | ICD-10-CM | POA: Insufficient documentation

## 2024-04-30 DIAGNOSIS — E860 Dehydration: Secondary | ICD-10-CM | POA: Insufficient documentation

## 2024-04-30 DIAGNOSIS — J449 Chronic obstructive pulmonary disease, unspecified: Secondary | ICD-10-CM | POA: Diagnosis not present

## 2024-04-30 LAB — URINALYSIS, ROUTINE W REFLEX MICROSCOPIC
Bilirubin Urine: NEGATIVE
Glucose, UA: NEGATIVE mg/dL
Hgb urine dipstick: NEGATIVE
Ketones, ur: NEGATIVE mg/dL
Leukocytes,Ua: NEGATIVE
Nitrite: NEGATIVE
Protein, ur: NEGATIVE mg/dL
Specific Gravity, Urine: 1.008 (ref 1.005–1.030)
pH: 7 (ref 5.0–8.0)

## 2024-04-30 LAB — CBC
HCT: 42.4 % (ref 36.0–46.0)
Hemoglobin: 14.2 g/dL (ref 12.0–15.0)
MCH: 32.1 pg (ref 26.0–34.0)
MCHC: 33.5 g/dL (ref 30.0–36.0)
MCV: 95.9 fL (ref 80.0–100.0)
Platelets: 46 K/uL — ABNORMAL LOW (ref 150–400)
RBC: 4.42 MIL/uL (ref 3.87–5.11)
RDW: 18 % — ABNORMAL HIGH (ref 11.5–15.5)
WBC: 10 K/uL (ref 4.0–10.5)
nRBC: 0 % (ref 0.0–0.2)

## 2024-04-30 LAB — COMPREHENSIVE METABOLIC PANEL WITH GFR
ALT: 43 U/L (ref 0–44)
AST: 99 U/L — ABNORMAL HIGH (ref 15–41)
Albumin: 3.4 g/dL — ABNORMAL LOW (ref 3.5–5.0)
Alkaline Phosphatase: 226 U/L — ABNORMAL HIGH (ref 38–126)
Anion gap: 13 (ref 5–15)
BUN: 8 mg/dL (ref 6–20)
CO2: 24 mmol/L (ref 22–32)
Calcium: 8.8 mg/dL — ABNORMAL LOW (ref 8.9–10.3)
Chloride: 101 mmol/L (ref 98–111)
Creatinine, Ser: 0.4 mg/dL — ABNORMAL LOW (ref 0.44–1.00)
GFR, Estimated: >60 mL/min (ref >60)
Glucose, Bld: 130 mg/dL — ABNORMAL HIGH (ref 70–99)
Potassium: 4 mmol/L (ref 3.5–5.1)
Sodium: 138 mmol/L (ref 135–145)
Total Bilirubin: 0.7 mg/dL (ref 0.0–1.2)
Total Protein: 7 g/dL (ref 6.5–8.1)

## 2024-04-30 LAB — TROPONIN I (HIGH SENSITIVITY): Troponin I (High Sensitivity): 13 ng/L (ref <18)

## 2024-04-30 MED ORDER — SODIUM CHLORIDE 0.9 % IV BOLUS
1000.0000 mL | Freq: Once | INTRAVENOUS | Status: AC
  Administered 2024-04-30: 1000 mL via INTRAVENOUS

## 2024-04-30 NOTE — ED Provider Notes (Signed)
 [facility] Provider Note    Event Date/Time   First MD Initiated Contact with Patient 04/30/24 1658     (approximate)   History   Chief Complaint: Weakness   HPI  Carly [name] is a 53 y.o. female with a history of anxiety, COPD, ITP, alcohol abuse who comes ED complaining of near syncope today while in a store.  She was walking and seemed to become confused and disoriented.  Husband was worried she was going to fall so he held her up until they could bring a chair and when she was placed in the chair.  Patient denies any preceding chest pain or shortness of breath or severe headache.  She does note that over the last 2 weeks she has been having generalized bodyaches.  Feels like she is eating and drinking normally.  No vomiting or diarrhea.  No lateralizing paresthesias or motor weakness.        Past Medical History:  Diagnosis Date   Abnormal uterine bleeding (AUB) 08/26/2018   Alcohol abuse 01/11/2024   Alcohol-induced mood disorder with depressive symptoms (HCC) 01/11/2024   Anxiety    C. difficile diarrhea    07/04/21   Chicken pox    Depression    Depression, major, single episode, moderate (HCC) 08/04/2018   Diarrhea 06/07/2022   Dyspnea on exertion 05/24/2022   Elevated testosterone  level in female    Emphysema of lung (HCC)    bronchitis   Frequent headaches    Generalized anxiety disorder 08/23/2014   Hay fever    Hypertension    Idiopathic thrombocytopenic purpura (ITP) (HCC)    Iron deficiency 09/02/2017   Positive ANA (antinuclear antibody)    Tachycardia 09/09/2022   Thrombocytopenia 02/12/2016    Current Outpatient Rx   Order #: [id number] Class: Normal   Order #: [id number] Class: Normal   Order #: [id number] Class: Print   Order #: [id number] Class: Normal   Order #: [id number] Class: Historical Med   Order #: [id number] Class: Print   Order #: [id number] Class: Historical Med   Order #: [id number] Class: Normal   Order #:  [id number] Class: Normal   Order #: [id number] Class: Normal    Past Surgical History:  Procedure Laterality Date   HEMATOMA EVACUATION N/A 04/07/2020   Procedure: EVACUATION HEMATOMA  AND APPLICATION OF PRESSURE DRESSING;  Surgeon: Verdon Keen, MD;  Location: [facility] ORS;  Service: Gynecology;  Laterality: N/A;   HYSTEROSCOPY WITH NOVASURE N/A 02/21/2018   Procedure: HYSTEROSCOPY WITH NOVASURE, POLYPECTOMY;  Surgeon: Verdon Keen, MD;  Location: [facility] ORS;  Service: Gynecology;  Laterality: N/A;   TUBAL LIGATION  1997   TUBAL LIGATION      Physical Exam   Triage Vital Signs: ED Triage Vitals  Encounter Vitals Group     BP 04/30/24 1245 130/76     Girls Systolic BP Percentile --      Girls Diastolic BP Percentile --      Boys Systolic BP Percentile --      Boys Diastolic BP Percentile --      Pulse Rate 04/30/24 1245 (!) 102     Resp 04/30/24 1245 16     Temp 04/30/24 1245 97.9 F (36.6 C)     Temp Source 04/30/24 1245 Oral     SpO2 04/30/24 1245 94 %     Weight 04/30/24 1246 221 lb 9 oz (100.5 kg)     Height --      Head Circumference --      Peak Flow --  Pain Score 04/30/24 1246 0     Pain Loc --      Pain Education --      Exclude from Growth Chart --     Most recent vital signs: Vitals:   04/30/24 1245  BP: 130/76  Pulse: (!) 102  Resp: 16  Temp: 97.9 F (36.6 C)  SpO2: 94%    General: Awake, no distress.  CV:  Good peripheral perfusion.  Tachycardia heart rate 105.  Normal distal pulses Resp:  Normal effort.  Clear lungs Abd:  No distention.  Soft nontender Other:  Cranial nerves III through XII intact.  Ambulatory with steady gait.   ED Results / Procedures / Treatments   Labs (all labs ordered are listed, but only abnormal results are displayed) Labs Reviewed  CBC - Abnormal; Notable for the following components:      Result Value   RDW 18.0 (*)    Platelets 46 (*)    All other components within normal limits  URINALYSIS, ROUTINE W REFLEX  MICROSCOPIC - Abnormal; Notable for the following components:   Color, Urine YELLOW (*)    APPearance CLEAR (*)    All other components within normal limits  COMPREHENSIVE METABOLIC PANEL WITH GFR - Abnormal; Notable for the following components:   Glucose, Bld 130 (*)    Creatinine, Ser 0.40 (*)    Calcium 8.8 (*)    Albumin 3.4 (*)    AST 99 (*)    Alkaline Phosphatase 226 (*)    All other components within normal limits  CBG MONITORING, ED  TROPONIN I (HIGH SENSITIVITY)     EKG Interpreted by me Sinus tachycardia rate 105.  Normal axis intervals QRS ST segments T waves   RADIOLOGY    PROCEDURES:  Procedures   MEDICATIONS ORDERED IN ED: Medications  sodium chloride  0.9 % bolus 1,000 mL (1,000 mLs Intravenous New Bag/Given 04/30/24 1741)     IMPRESSION / MDM / ASSESSMENT AND PLAN / ED COURSE  I reviewed the triage vital signs and the nursing notes.  DDx: Dehydration, electrolyte derangement, anemia, arrhythmia, myocarditis, vagal episode  Patient's presentation is most consistent with acute presentation with potential threat to life or bodily function.  Patient brought to the ED after near syncope episode today in the setting of fatigue and malaise and bodyaches over the last 2 weeks.  No focal symptoms.  Doubt stroke or intracranial hemorrhage.  No evidence of ACS PE dissection.  Labs are reassuring, will check orthostatics after IV fluids for hydration.   Clinical Course as of 04/30/24 1902  Thu Apr 30, 2024  [id number] Patient reports feeling much better, back to normal.  Counseled on hydration, stable for discharge. [PS]    Clinical Course User Index [PS] Viviann Pastor, MD     FINAL CLINICAL IMPRESSION(S) / ED DIAGNOSES   Final diagnoses:  Near syncope  Dehydration     Rx / DC Orders   ED Discharge Orders     None        Note:  This document was prepared using Dragon voice recognition software and may include unintentional dictation  errors.   Viviann Pastor, MD 04/30/24 Carly [name]

## 2024-04-30 NOTE — ED Triage Notes (Addendum)
  C/O weakness, jurting all over x 2 weeks.  While shopping today, got into the store, almost fell, ? Blacked out. Couldn't talk.  Initially was disoriented.  No current symptoms, just feeling tired and weak.  AAOx3. Skin warm and dry. NAD  BIB [facility]. Per report patient with episode of slurred speech and SOB.  Weakness x 2 weeks.  STroke screen negatvie. SBO resolved with 2 puffs of inhalers.  CBG:  131/  VS  160/85 103 96% 98.2.  20 g LFA

## 2024-04-30 NOTE — ED Notes (Signed)
 PT in no acute distress prior to discharge. Discharged instructions reviewed, all questions answered and pt verbalized understanding at this time. Pt has all belongings with them at time of discharge.

## 2024-05-05 ENCOUNTER — Inpatient Hospital Stay

## 2024-05-05 ENCOUNTER — Inpatient Hospital Stay: Attending: Oncology

## 2024-05-05 ENCOUNTER — Inpatient Hospital Stay: Admitting: Oncology

## 2024-05-11 ENCOUNTER — Encounter

## 2024-05-11 ENCOUNTER — Telehealth: Admitting: Physician Assistant

## 2024-05-11 DIAGNOSIS — K047 Periapical abscess without sinus: Secondary | ICD-10-CM | POA: Diagnosis not present

## 2024-05-11 MED ORDER — AMOXICILLIN-POT CLAVULANATE 875-125 MG PO TABS: 1.0000 | ORAL_TABLET | Freq: Two times a day (BID) | ORAL | 0 refills | Status: DC

## 2024-05-11 NOTE — Progress Notes (Signed)

## 2024-05-12 ENCOUNTER — Encounter: Payer: Self-pay | Admitting: Oncology

## 2024-05-12 ENCOUNTER — Inpatient Hospital Stay: Attending: Oncology

## 2024-05-12 ENCOUNTER — Inpatient Hospital Stay

## 2024-05-12 ENCOUNTER — Inpatient Hospital Stay (HOSPITAL_BASED_OUTPATIENT_CLINIC_OR_DEPARTMENT_OTHER): Admitting: Oncology

## 2024-05-12 VITALS — BP 131/77 | HR 86 | Temp 96.8°F | Resp 16 | Wt 223.0 lb

## 2024-05-12 DIAGNOSIS — D693 Immune thrombocytopenic purpura: Secondary | ICD-10-CM | POA: Insufficient documentation

## 2024-05-12 DIAGNOSIS — Z809 Family history of malignant neoplasm, unspecified: Secondary | ICD-10-CM | POA: Insufficient documentation

## 2024-05-12 DIAGNOSIS — F17210 Nicotine dependence, cigarettes, uncomplicated: Secondary | ICD-10-CM | POA: Insufficient documentation

## 2024-05-12 LAB — CBC WITH DIFFERENTIAL (CANCER CENTER ONLY)
Abs Immature Granulocytes: 0.02 K/uL (ref 0.00–0.07)
Basophils Absolute: 0.1 K/uL (ref 0.0–0.1)
Basophils Relative: 1 %
Eosinophils Absolute: 0.2 K/uL (ref 0.0–0.5)
Eosinophils Relative: 2 %
HCT: 41.5 % (ref 36.0–46.0)
Hemoglobin: 13.9 g/dL (ref 12.0–15.0)
Immature Granulocytes: 0 %
Lymphocytes Relative: 28 %
Lymphs Abs: 2 K/uL (ref 0.7–4.0)
MCH: 32.4 pg (ref 26.0–34.0)
MCHC: 33.5 g/dL (ref 30.0–36.0)
MCV: 96.7 fL (ref 80.0–100.0)
Monocytes Absolute: 0.6 K/uL (ref 0.1–1.0)
Monocytes Relative: 9 %
Neutro Abs: 4.1 K/uL (ref 1.7–7.7)
Neutrophils Relative %: 60 %
Platelet Count: 46 K/uL — ABNORMAL LOW (ref 150–400)
RBC: 4.29 MIL/uL (ref 3.87–5.11)
RDW: 17.7 % — ABNORMAL HIGH (ref 11.5–15.5)
WBC Count: 6.9 K/uL (ref 4.0–10.5)
nRBC: 0 % (ref 0.0–0.2)

## 2024-05-12 NOTE — Progress Notes (Signed)
 Platelet result of 46 today, no Nplate  per MD

## 2024-05-13 ENCOUNTER — Encounter: Payer: Self-pay | Admitting: Oncology

## 2024-05-13 NOTE — Progress Notes (Signed)
 [name]  [phone]) [phone] [phone]) [phone]  ID: Carly [name] OB: 21-Jan-1971  MR#: [id number]  RDW#:[id number]  Patient Care Team: Bair, Kalpana, MD as PCP - General (Family Medicine) Jacobo [name], MD as Consulting Physician (Oncology) Tamea Dedra CROME, MD as Consulting Physician (Pulmonary Disease)   CHIEF COMPLAINT: ITP.  INTERVAL HISTORY: Patient returns to clinic today for repeat laboratory work, further evaluation, and consideration of Nplate .  She currently feels well and is asymptomatic.  She does not complain of any easy bleeding or bruising.  She has no neurologic complaints. She has a good appetite and denies weight loss.  She denies any chest pain, shortness of breath, cough, or hemoptysis. She denies any nausea, vomiting, constipation, or diarrhea.  She has no urinary complaints.  Patient offers no specific complaints today.  REVIEW OF SYSTEMS:   Review of Systems  Constitutional: Negative.  Negative for fever, malaise/fatigue and weight loss.  Respiratory: Negative.  Negative for cough, hemoptysis and shortness of breath.   Cardiovascular: Negative.  Negative for chest pain and leg swelling.  Gastrointestinal: Negative.  Negative for abdominal pain, blood in stool and melena.  Genitourinary: Negative.  Negative for hematuria.  Musculoskeletal: Negative.  Negative for back pain.  Skin: Negative.  Negative for rash.  Neurological: Negative.  Negative for dizziness, focal weakness, weakness and headaches.  Endo/Heme/Allergies: Negative.  Does not bruise/bleed easily.  Psychiatric/Behavioral: Negative.  The patient is not nervous/anxious.     As per HPI. Otherwise, a complete review of systems is negative.  PAST MEDICAL HISTORY: Past Medical History:  Diagnosis Date   Abnormal uterine bleeding (AUB) 08/26/2018   Alcohol abuse 01/11/2024   Alcohol-induced mood disorder with depressive symptoms (HCC) 01/11/2024   Anxiety    C.  difficile diarrhea    07/04/21   Chicken pox    Depression    Depression, major, single episode, moderate (HCC) 08/04/2018   Diarrhea 06/07/2022   Dyspnea on exertion 05/24/2022   Elevated testosterone  level in female    Emphysema of lung (HCC)    bronchitis   Frequent headaches    Generalized anxiety disorder 08/23/2014   Hay fever    Hypertension    Idiopathic thrombocytopenic purpura (ITP) (HCC)    Iron deficiency 09/02/2017   Positive ANA (antinuclear antibody)    Tachycardia 09/09/2022   Thrombocytopenia 02/12/2016    PAST SURGICAL HISTORY: Past Surgical History:  Procedure Laterality Date   HEMATOMA EVACUATION N/A 04/07/2020   Procedure: EVACUATION HEMATOMA  AND APPLICATION OF PRESSURE DRESSING;  Surgeon: Verdon Keen, MD;  Location: [facility];  Service: Gynecology;  Laterality: N/A;   HYSTEROSCOPY WITH NOVASURE N/A 02/21/2018   Procedure: HYSTEROSCOPY WITH NOVASURE, POLYPECTOMY;  Surgeon: Verdon Keen, MD;  Location: [facility];  Service: Gynecology;  Laterality: N/A;   TUBAL LIGATION  1997   TUBAL LIGATION      FAMILY HISTORY: Family History  Problem Relation Age of Onset   Hyperlipidemia Mother    Heart disease Mother    Stroke Mother    Depression Mother    Mental illness Mother    Heart attack Mother 76   Hyperlipidemia Father    Depression Father    Mental illness Father    Diabetes Paternal Grandfather    Cancer Cousin     ADVANCED DIRECTIVES (Y/N):  N  HEALTH MAINTENANCE: Social History   Tobacco Use   Smoking status: Every Day    Current packs/day: 1.50    Average packs/day: 1.5 packs/day for  28.0 years (42.0 ttl pk-yrs)    Types: Cigarettes   Smokeless tobacco: Never   Tobacco comments:    1 pack a day now   Vaping Use   Vaping status: Every Day  Substance Use Topics   Alcohol use: Not Currently    Alcohol/week: 8.0 standard drinks of alcohol    Types: 8 Standard drinks or equivalent per week   Drug use: No    Comment: CBD oil      Colonoscopy:  PAP:  Bone density:  Lipid panel:  Allergies  Allergen Reactions   Wellbutrin  [Bupropion ]     Crazy    Meloxicam  Nausea Only    Current Outpatient Medications  Medication Sig Dispense Refill   albuterol  (PROVENTIL ) (2.5 MG/3ML) 0.083% nebulizer solution Take 3 mLs (2.5 mg total) by nebulization every 6 (six) hours as needed for wheezing or shortness of breath. 150 mL 1   albuterol  (VENTOLIN  HFA) 108 (90 Base) MCG/ACT inhaler INHALE 2 PUFFS INTO THE LUNGS EVERY 6 HOURS AS NEEDED FOR WHEEZING OR SHORTNESS OF BREATH (COUGH) 6.7 each 3   amoxicillin -clavulanate (AUGMENTIN ) 875-125 MG tablet Take 1 tablet by mouth 2 (two) times daily. 14 tablet 0   ARIPiprazole  (ABILIFY ) 5 MG tablet Take 1 tablet (5 mg total) by mouth daily. 30 tablet 0   BREZTRI  AEROSPHERE 160-9-4.8 MCG/ACT AERO inhaler INHALE 2 PUFFS INTO THE LUNGS IN THE MORNING AND AT BEDTIME. 10.7 each 5   escitalopram  (LEXAPRO ) 10 MG tablet Take 10 mg by mouth daily.     hydrOXYzine  (ATARAX ) 25 MG tablet Take 1 tablet (25 mg total) by mouth 2 (two) times daily as needed for anxiety. 60 tablet 0   lamoTRIgine  (LAMICTAL ) 100 MG tablet Take 100 mg by mouth daily.     metoprolol  succinate (TOPROL -XL) 100 MG 24 hr tablet Take 1 tablet (100 mg total) by mouth daily. TAKE WITH OR IMMEDIATELY FOLLOWING A MEAL. 90 tablet 3   omeprazole  (PRILOSEC) 20 MG capsule Take 1 capsule (20 mg total) by mouth daily. 90 capsule 3   varenicline  (CHANTIX ) 0.5 MG tablet Take 1 tablet (0.5 mg total) by mouth daily for 3 days, THEN 1 tablet (0.5 mg total) 2 (two) times daily for 4 days, THEN 2 tablets (1 mg total) 2 (two) times daily. 131 tablet 0   No current facility-administered medications for this visit.    OBJECTIVE: Vitals:   05/12/24 1522  BP: 131/77  Pulse: 86  Resp: 16  Temp: (!) 96.8 F (36 C)  SpO2: 96%     Body mass index is 40.79 kg/m.    ECOG FS:0 - Asymptomatic  General: Well-developed, well-nourished, no acute  distress. Eyes: Pink conjunctiva, anicteric sclera. HEENT: Normocephalic, moist mucous membranes. Lungs: No audible wheezing or coughing. Heart: Regular rate and rhythm. Abdomen: Soft, nontender, no obvious distention. Musculoskeletal: No edema, cyanosis, or clubbing. Neuro: Alert, answering all questions appropriately. Cranial nerves grossly intact. Skin: No rashes or petechiae noted. Psych: Normal affect.  LAB RESULTS:  Lab Results  Component Value Date   NA 138 04/30/2024   K 4.0 04/30/2024   CL 101 04/30/2024   CO2 24 04/30/2024   GLUCOSE 130 (H) 04/30/2024   BUN 8 04/30/2024   CREATININE 0.40 (L) 04/30/2024   CALCIUM 8.8 (L) 04/30/2024   PROT 7.0 04/30/2024   ALBUMIN 3.4 (L) 04/30/2024   AST 99 (H) 04/30/2024   ALT 43 04/30/2024   ALKPHOS 226 (H) 04/30/2024   BILITOT 0.7 04/30/2024   GFRNONAA >60  04/30/2024   GFRAA >60 04/08/2020    Lab Results  Component Value Date   WBC 6.9 05/12/2024   NEUTROABS 4.1 05/12/2024   HGB 13.9 05/12/2024   HCT 41.5 05/12/2024   MCV 96.7 05/12/2024   PLT 46 (L) 05/12/2024     STUDIES: No results found.  ASSESSMENT: ITP  PLAN:    ITP: Confirmed with normal bone marrow biopsy and normal FISH and cytogenetics on July 22, 2019.  Patient had a positive ANA which is likely clinically insignificant.  Previously, the remainder of her laboratory work was either negative or within normal limits.  CT scan of the chest on March 27, 2023 did not reveal any significant pathology and did not report any splenomegaly.  Previously, patient received 1 week of prednisone  at 1 mg/kg dose and had no appreciable change in her platelet count.  Rituxan was denied by insurance. In January 2024 patient's platelet count increased from 38-191 with 6 mcg/kg of Nplate .  Patient last received Nplate  on November 06, 2023.  Her platelet count remains decreased at 46, but essentially unchanged from previous.  Will continue to monitor and treat if platelet count  falls below 40.she does not require treatment today.  Return to clinic in 2 months with repeat laboratory, further evaluation, and continuation of treatment if needed.    History of positive ANA: Likely clinically insignificant.  Referral was previously sent to rheumatology.  I spent a total of 20 minutes reviewing chart data, face-to-face evaluation with the patient, counseling and coordination of care as detailed above.   Patient expressed understanding and was in agreement with this plan. She also understands that She can call clinic at any time with any questions, concerns, or complaints.    Evalene JINNY Reusing, MD   05/13/2024 7:25 AM

## 2024-06-10 ENCOUNTER — Encounter

## 2024-06-10 DIAGNOSIS — D693 Immune thrombocytopenic purpura: Secondary | ICD-10-CM

## 2024-06-10 DIAGNOSIS — Z124 Encounter for screening for malignant neoplasm of cervix: Secondary | ICD-10-CM

## 2024-06-10 DIAGNOSIS — R7309 Other abnormal glucose: Secondary | ICD-10-CM

## 2024-06-10 DIAGNOSIS — E785 Hyperlipidemia, unspecified: Secondary | ICD-10-CM

## 2024-06-10 DIAGNOSIS — I10 Essential (primary) hypertension: Secondary | ICD-10-CM

## 2024-06-23 ENCOUNTER — Telehealth: Admitting: Physician Assistant

## 2024-06-23 DIAGNOSIS — J069 Acute upper respiratory infection, unspecified: Secondary | ICD-10-CM

## 2024-06-23 MED ORDER — IPRATROPIUM BROMIDE 0.03 % NA SOLN: 2.0000 | Freq: Two times a day (BID) | NASAL | 0 refills | Status: AC

## 2024-06-23 MED ORDER — BENZONATATE 100 MG PO CAPS: 100.0000 mg | ORAL_CAPSULE | Freq: Three times a day (TID) | ORAL | 0 refills | Status: AC | PRN

## 2024-06-23 NOTE — Progress Notes (Signed)
 We are sorry that you are not feeling well.  Here is how we plan to help!  Based on what you have shared with me it looks like you have sinusitis.  Sinusitis is inflammation and infection in the sinus cavities of the head.  Based on your presentation I believe you most likely have Acute Viral Sinusitis.This is an infection most likely caused by a virus. There is not specific treatment for viral sinusitis other than to help you with the symptoms until the infection runs its course.  You may use plain Mucinex . Saline nasal spray help and can safely be used as often as needed for congestion, I have prescribed: Ipratropium Bromide  nasal spray 0.03% 2 sprays in eah nostril 2-3 times a day. I have also sent in a prescription cough medication to take as directed.  Some authorities believe that zinc  sprays or the use of Echinacea may shorten the course of your symptoms.  Sinus infections are not as easily transmitted as other respiratory infection, however we still recommend that you avoid close contact with loved ones, especially the very young and elderly.  Remember to wash your hands thoroughly throughout the day as this is the number one way to prevent the spread of infection!  Home Care: Only take medications as instructed by your medical team. Do not take these medications with alcohol. A steam or ultrasonic humidifier can help congestion.  You can place a towel over your head and breathe in the steam from hot water coming from a faucet. Avoid close contacts especially the very young and the elderly. Cover your mouth when you cough or sneeze. Always remember to wash your hands.  Get Help Right Away If: You develop worsening fever or sinus pain. You develop a severe head ache or visual changes. Your symptoms persist after you have completed your treatment plan.  Make sure you Understand these instructions. Will watch your condition. Will get help right away if you are not doing well or get  worse.  Your e-visit answers were reviewed by a board certified advanced clinical practitioner to complete your personal care plan.  Depending on the condition, your plan could have included both over the counter or prescription medications.  If there is a problem please reply  once you have received a response from your provider.  Your safety is important to us .  If you have drug allergies check your prescription carefully.    You can use MyChart to ask questions about today's visit, request a non-urgent call back, or ask for a work or school excuse for 24 hours related to this e-Visit. If it has been greater than 24 hours you will need to follow up with your provider, or enter a new e-Visit to address those concerns.  You will get an e-mail in the next two days asking about your experience.  I hope that your e-visit has been valuable and will speed your recovery. Thank you for using e-visits.  I have spent 5 minutes in review of e-visit questionnaire, review and updating patient chart, medical decision making and response to patient.   Elsie Velma Lunger, PA-C

## 2024-06-25 ENCOUNTER — Telehealth: Admitting: Physician Assistant

## 2024-06-25 DIAGNOSIS — U071 COVID-19: Secondary | ICD-10-CM

## 2024-06-25 MED ORDER — NIRMATRELVIR/RITONAVIR (PAXLOVID)TABLET: 3.0000 | ORAL_TABLET | Freq: Two times a day (BID) | ORAL | 0 refills | Status: AC

## 2024-06-25 MED ORDER — PROMETHAZINE-DM 6.25-15 MG/5ML PO SYRP: 2.5000 mL | ORAL_SOLUTION | Freq: Every evening | ORAL | 0 refills | Status: AC | PRN

## 2024-06-25 NOTE — Progress Notes (Signed)
 Virtual Visit Consent   [name], you are scheduled for a virtual visit with a [facility] provider today. Just as with appointments in the office, your consent must be obtained to participate. Your consent will be active for this visit and any virtual visit you may have with one of our providers in the next 365 days. If you have a MyChart account, a copy of this consent can be sent to you electronically.  As this is a virtual visit, video technology does not allow for your provider to perform a traditional examination. This may limit your provider's ability to fully assess your condition. If your provider identifies any concerns that need to be evaluated in person or the need to arrange testing (such as labs, EKG, etc.), we will make arrangements to do so. Although advances in technology are sophisticated, we cannot ensure that it will always work on either your end or our end. If the connection with a video visit is poor, the visit may have to be switched to a telephone visit. With either a video or telephone visit, we are not always able to ensure that we have a secure connection.  By engaging in this virtual visit, you consent to the provision of healthcare and authorize for your insurance to be billed (if applicable) for the services provided during this visit. Depending on your insurance coverage, you may receive a charge related to this service.  I need to obtain your verbal consent now. Are you willing to proceed with your visit today? Carly [name] has provided verbal consent on 06/25/2024 for a virtual visit (video or telephone). [name], PA-C  Date: 06/25/2024 12:54 PM   Virtual Visit via Video Note   IDelon CHRISTELLA Dickinson, [name]  ([id number], Jul 26, 1970) on 06/25/2024 at 12:45 PM EST by a video-enabled telemedicine application and verified that I am speaking with the correct person using two identifiers.  Location: Patient: Virtual Visit  Location Patient: Home Provider: Virtual Visit Location Provider: Home Office   I discussed the limitations of evaluation and management by telemedicine and the availability of in person appointments. The patient expressed understanding and agreed to proceed.    History of Present Illness: Carly [name] is a 53 y.o. who identifies as a female who was assigned female at birth, and is being seen today for Covid 58.  HPI: URI  This is a new problem. The current episode started in the past 7 days (Symptoms started 2 days ago, tested positive for Covid today). The problem has been unchanged. Maximum temperature: had chills, hot and cold flashes. Associated symptoms include congestion, coughing, headaches, a plugged ear sensation, rhinorrhea, sinus pain, a sore throat (scracthy not sore) and wheezing. Pertinent negatives include no diarrhea, ear pain, nausea or vomiting. She has tried inhaler use, acetaminophen  and increased fluids (tessalon ) for the symptoms. The treatment provided mild relief.   94-97% pulse ox   Problems:  Patient Active Problem List   Diagnosis Date Noted   Bipolar 2 disorder (HCC) 01/12/2024   Bipolar I disorder, most recent episode mixed (HCC) 01/11/2024   Left upper quadrant abdominal pain 08/21/2023   Tobacco abuse 08/06/2023   COPD with acute exacerbation (HCC) 07/17/2023   Stress incontinence 04/03/2023   Encounter for screening mammogram for breast cancer 03/28/2023   Pulmonary nodules 09/09/2022   Prolonged QT interval 09/09/2022   COPD with acute bronchitis (HCC) 09/08/2022   Emphysema lung (HCC) 08/08/2022   Aortic  atherosclerosis 08/08/2022   Colon cancer screening 08/08/2022   Abnormal glucose 08/08/2022   Dyspnea on exertion 05/24/2022   Respiratory bronchiolitis associated interstitial lung disease (HCC) 05/24/2022   Allergic rhinitis 11/24/2021   Lumbar radiculopathy 07/11/2021   Gastroesophageal reflux disease without esophagitis 07/01/2020    Hyperlipidemia 07/01/2020   Fatty liver 12/24/2019   Angiolipoma of left kidney 12/24/2019   Chronic ITP (idiopathic thrombocytopenia) (HCC) 12/24/2019   Chronic ITP (idiopathic thrombocytopenic purpura) (HCC) 12/02/2019   Plantar fasciitis, bilateral 09/29/2019   Osteoarthritis of both knees 09/29/2019   Overactive bladder 07/14/2019   B12 deficiency 06/10/2019   Vitamin D  deficiency 06/10/2019   Elevated LFTs 01/30/2019   Insomnia 08/15/2016   Thrombocytopenia 02/12/2016   Carpal tunnel syndrome 01/20/2016   Obesity, Class III, BMI 40-49.9 (morbid obesity) (HCC) 01/04/2016   Asthma-COPD overlap syndrome (HCC) 01/13/2015   Female hirsutism 09/22/2014   Elevated testosterone  level in female 09/22/2014   Smoking 08/23/2014   HTN (hypertension) 08/23/2014    Allergies: Allergies[1] Medications: Current Medications[2]  Observations/Objective: Patient is well-developed, well-nourished in no acute distress.  Resting comfortably at home.  Head is normocephalic, atraumatic.  No labored breathing.  Speech is clear and coherent with logical content.  Patient is alert and oriented at baseline.    Assessment and Plan: 1. COVID-19 (Primary) - nirmatrelvir/ritonavir (PAXLOVID) 20 x 150 MG & 10 x 100MG  TABS; Take 3 tablets by mouth 2 (two) times daily for 5 days. (Take nirmatrelvir 150 mg two tablets twice daily for 5 days and ritonavir 100 mg one tablet twice daily for 5 days) Patient GFR is greater than 60  Dispense: 30 tablet; Refill: 0 - promethazine -dextromethorphan  (PROMETHAZINE -DM) 6.25-15 MG/5ML syrup; Take 2.5 mLs by mouth at bedtime as needed for cough.  Dispense: 118 mL; Refill: 0  - Continue OTC symptomatic management of choice - Will send OTC vitamins and supplement information through AVS - Paxlovid prescribed - Did add Promethazine  DM for cough, but minimize use; using lowest dose and only at bedtime due to increased risk of prolonged QT (medication interaction with  Lamictal , Lexapro ) - Patient enrolled in MyChart symptom monitoring - Push fluids - Rest as needed - Discussed return precautions and when to seek in-person evaluation, sent via AVS as well   Follow Up Instructions: I discussed the assessment and treatment plan with the patient. The patient was provided an opportunity to ask questions and all were answered. The patient agreed with the plan and demonstrated an understanding of the instructions.  A copy of instructions were sent to the patient via MyChart unless otherwise noted below.    The patient was advised to call back or seek an in-person evaluation if the symptoms worsen or if the condition fails to improve as anticipated.    Heath Badon M Maegen Wigle, PA-C     [1]  Allergies Allergen Reactions   Wellbutrin  [Bupropion ]     Crazy    Meloxicam  Nausea Only  [2]  Current Outpatient Medications:    nirmatrelvir/ritonavir (PAXLOVID) 20 x 150 MG & 10 x 100MG  TABS, Take 3 tablets by mouth 2 (two) times daily for 5 days. (Take nirmatrelvir 150 mg two tablets twice daily for 5 days and ritonavir 100 mg one tablet twice daily for 5 days) Patient GFR is greater than 60, Disp: 30 tablet, Rfl: 0   promethazine -dextromethorphan  (PROMETHAZINE -DM) 6.25-15 MG/5ML syrup, Take 2.5 mLs by mouth at bedtime as needed for cough., Disp: 118 mL, Rfl: 0   albuterol  (PROVENTIL ) (2.5 MG/3ML) 0.083% nebulizer  solution, Take 3 mLs (2.5 mg total) by nebulization every 6 (six) hours as needed for wheezing or shortness of breath., Disp: 150 mL, Rfl: 1   albuterol  (VENTOLIN  HFA) 108 (90 Base) MCG/ACT inhaler, INHALE 2 PUFFS INTO THE LUNGS EVERY 6 HOURS AS NEEDED FOR WHEEZING OR SHORTNESS OF BREATH (COUGH), Disp: 6.7 each, Rfl: 3   ARIPiprazole  (ABILIFY ) 5 MG tablet, Take 1 tablet (5 mg total) by mouth daily., Disp: 30 tablet, Rfl: 0   benzonatate  (TESSALON ) 100 MG capsule, Take 1 capsule (100 mg total) by mouth 3 (three) times daily as needed for cough., Disp: 30  capsule, Rfl: 0   BREZTRI  AEROSPHERE 160-9-4.8 MCG/ACT AERO inhaler, INHALE 2 PUFFS INTO THE LUNGS IN THE MORNING AND AT BEDTIME., Disp: 10.7 each, Rfl: 5   escitalopram  (LEXAPRO ) 10 MG tablet, Take 10 mg by mouth daily., Disp: , Rfl:    hydrOXYzine  (ATARAX ) 25 MG tablet, Take 1 tablet (25 mg total) by mouth 2 (two) times daily as needed for anxiety., Disp: 60 tablet, Rfl: 0   ipratropium (ATROVENT ) 0.03 % nasal spray, Place 2 sprays into both nostrils every 12 (twelve) hours., Disp: 30 mL, Rfl: 0   lamoTRIgine  (LAMICTAL ) 100 MG tablet, Take 100 mg by mouth daily., Disp: , Rfl:    metoprolol  succinate (TOPROL -XL) 100 MG 24 hr tablet, Take 1 tablet (100 mg total) by mouth daily. TAKE WITH OR IMMEDIATELY FOLLOWING A MEAL., Disp: 90 tablet, Rfl: 3   omeprazole  (PRILOSEC) 20 MG capsule, Take 1 capsule (20 mg total) by mouth daily., Disp: 90 capsule, Rfl: 3

## 2024-06-25 NOTE — Progress Notes (Signed)
° °  Thank you for the details you included in the comment boxes. Those details are very helpful in determining the best course of treatment for you and help us  to provide the best care.Because of COVID positive and medical history making you a potential candidate for antiviral medications, we recommend that you schedule a Virtual Urgent Care video visit in order for the provider to better assess what is going on.  The provider will be able to give you a more accurate diagnosis and treatment plan if we can more freely discuss your symptoms and with the addition of a virtual examination.   If you change your visit to a video visit, we will bill your insurance (similar to an office visit) and you will not be charged for this e-Visit. You will be able to stay at home and speak with the first available [facility] advanced practice provider. The link to do a video visit is in the drop down Menu tab of your Welcome screen in MyChart.

## 2024-06-25 NOTE — Patient Instructions (Signed)
 Carly [name], thank you for joining [name], PA-C for today's virtual visit.  While this provider is not your primary care provider (PCP), if your PCP is located in our provider database this encounter information will be shared with them immediately following your visit.   A [facility] MyChart account gives you access to today's visit and all your visits, tests, and labs performed at [facility]  click here if you don't have a [facility] MyChart account or go to [email or web address]  Consent: (Patient) Carly [name] provided verbal consent for this virtual visit at the beginning of the encounter.  Current Medications:  Current Outpatient Medications:    nirmatrelvir/ritonavir (PAXLOVID) 20 x 150 MG & 10 x 100MG  TABS, Take 3 tablets by mouth 2 (two) times daily for 5 days. (Take nirmatrelvir 150 mg two tablets twice daily for 5 days and ritonavir 100 mg one tablet twice daily for 5 days) Patient GFR is greater than 60, Disp: 30 tablet, Rfl: 0   promethazine -dextromethorphan  (PROMETHAZINE -DM) 6.25-15 MG/5ML syrup, Take 2.5 mLs by mouth at bedtime as needed for cough., Disp: 118 mL, Rfl: 0   albuterol  (PROVENTIL ) (2.5 MG/3ML) 0.083% nebulizer solution, Take 3 mLs (2.5 mg total) by nebulization every 6 (six) hours as needed for wheezing or shortness of breath., Disp: 150 mL, Rfl: 1   albuterol  (VENTOLIN  HFA) 108 (90 Base) MCG/ACT inhaler, INHALE 2 PUFFS INTO THE LUNGS EVERY 6 HOURS AS NEEDED FOR WHEEZING OR SHORTNESS OF BREATH (COUGH), Disp: 6.7 each, Rfl: 3   ARIPiprazole  (ABILIFY ) 5 MG tablet, Take 1 tablet (5 mg total) by mouth daily., Disp: 30 tablet, Rfl: 0   benzonatate  (TESSALON ) 100 MG capsule, Take 1 capsule (100 mg total) by mouth 3 (three) times daily as needed for cough., Disp: 30 capsule, Rfl: 0   BREZTRI  AEROSPHERE 160-9-4.8 MCG/ACT AERO inhaler, INHALE 2 PUFFS INTO THE LUNGS IN THE MORNING AND AT BEDTIME., Disp: 10.7 each, Rfl: 5   escitalopram   (LEXAPRO ) 10 MG tablet, Take 10 mg by mouth daily., Disp: , Rfl:    hydrOXYzine  (ATARAX ) 25 MG tablet, Take 1 tablet (25 mg total) by mouth 2 (two) times daily as needed for anxiety., Disp: 60 tablet, Rfl: 0   ipratropium (ATROVENT ) 0.03 % nasal spray, Place 2 sprays into both nostrils every 12 (twelve) hours., Disp: 30 mL, Rfl: 0   lamoTRIgine  (LAMICTAL ) 100 MG tablet, Take 100 mg by mouth daily., Disp: , Rfl:    metoprolol  succinate (TOPROL -XL) 100 MG 24 hr tablet, Take 1 tablet (100 mg total) by mouth daily. TAKE WITH OR IMMEDIATELY FOLLOWING A MEAL., Disp: 90 tablet, Rfl: 3   omeprazole  (PRILOSEC) 20 MG capsule, Take 1 capsule (20 mg total) by mouth daily., Disp: 90 capsule, Rfl: 3   Medications ordered in this encounter:  Meds ordered this encounter  Medications   nirmatrelvir/ritonavir (PAXLOVID) 20 x 150 MG & 10 x 100MG  TABS    Sig: Take 3 tablets by mouth 2 (two) times daily for 5 days. (Take nirmatrelvir 150 mg two tablets twice daily for 5 days and ritonavir 100 mg one tablet twice daily for 5 days) Patient GFR is greater than 60    Dispense:  30 tablet    Refill:  0    Supervising Provider:   BLAISE ALEENE KIDD [[id number]]   promethazine -dextromethorphan  (PROMETHAZINE -DM) 6.25-15 MG/5ML syrup    Sig: Take 2.5 mLs by mouth at bedtime as needed for cough.    Dispense:  118 mL  Refill:  0    Supervising Provider:   BLAISE ALEENE KIDD [[id number]]     *If you need refills on other medications prior to your next appointment, please contact your pharmacy*  Follow-Up: Call back or seek an in-person evaluation if the symptoms worsen or if the condition fails to improve as anticipated.  [facility] [phone]  Care Instructions: Can take to lessen severity (if able): Vit C 500mg  twice daily Quercertin 250-500mg  twice daily Zinc  75-100mg  daily Melatonin 3-6 mg at bedtime Vit D3 1000-2000 IU daily Aspirin 81 mg daily with food Optional: Famotidine  20mg  daily Also can  add tylenol /ibuprofen  as needed for fevers and body aches May add Mucinex  or Mucinex  DM as needed for cough/congestion    Isolation Instructions: You are to isolate at home until you have been fever free for at least 24 hours without a fever-reducing medication, and symptoms have been steadily improving for 24 hours. At that time,  you can end isolation but need to mask for an additional 5 days.   If you must be around other household members who do not have symptoms, you need to make sure that both you and the family members are masking consistently with a high-quality mask.  If you note any worsening of symptoms despite treatment, please seek an in-person evaluation ASAP. If you note any significant shortness of breath or any chest pain, please seek ER evaluation. Please do not delay care!   COVID-19: What to Do if You Are Sick If you test positive and are an older adult or someone who is at high risk of getting very sick from COVID-19, treatment may be available. Contact a healthcare provider right away after a positive test to determine if you are eligible, even if your symptoms are mild right now. You can also visit a Test to Treat location and, if eligible, receive a prescription from a provider. Don't delay: Treatment must be started within the first few days to be effective. If you have a fever, cough, or other symptoms, you might have COVID-19. Most people have mild illness and are able to recover at home. If you are sick: Keep track of your symptoms. If you have an emergency warning sign (including trouble breathing), call 911. Steps to help prevent the spread of COVID-19 if you are sick If you are sick with COVID-19 or think you might have COVID-19, follow the steps below to care for yourself and to help protect other people in your home and community. Stay home except to get medical care Stay home. Most people with COVID-19 have mild illness and can recover at home without medical  care. Do not leave your home, except to get medical care. Do not visit public areas and do not go to places where you are unable to wear a mask. Take care of yourself. Get rest and stay hydrated. Take over-the-counter medicines, such as acetaminophen , to help you feel better. Stay in touch with your doctor. Call before you get medical care. Be sure to get care if you have trouble breathing, or have any other emergency warning signs, or if you think it is an emergency. Avoid public transportation, ride-sharing, or taxis if possible. Get tested If you have symptoms of COVID-19, get tested. While waiting for test results, stay away from others, including staying apart from those living in your household. Get tested as soon as possible after your symptoms start. Treatments may be available for people with COVID-19 who are at risk  for becoming very sick. Don't delay: Treatment must be started early to be effective--some treatments must begin within 5 days of your first symptoms. Contact your healthcare provider right away if your test result is positive to determine if you are eligible. Self-tests are one of several options for testing for the virus that causes COVID-19 and may be more convenient than laboratory-based tests and point-of-care tests. Ask your healthcare provider or your local health department if you need help interpreting your test results. You can visit your state, tribal, local, and territorial health department's website to look for the latest local information on testing sites. Separate yourself from other people As much as possible, stay in a specific room and away from other people and pets in your home. If possible, you should use a separate bathroom. If you need to be around other people or animals in or outside of the home, wear a well-fitting mask. Tell your close contacts that they may have been exposed to COVID-19. An infected person can spread COVID-19 starting 48 hours (or 2 days)  before the person has any symptoms or tests positive. By letting your close contacts know they may have been exposed to COVID-19, you are helping to protect everyone. See COVID-19 and Animals if you have questions about pets. If you are diagnosed with COVID-19, someone from the health department may call you. Answer the call to slow the spread. Monitor your symptoms Symptoms of COVID-19 include fever, cough, or other symptoms. Follow care instructions from your healthcare provider and local health department. Your local health authorities may give instructions on checking your symptoms and reporting information. When to seek emergency medical attention Look for emergency warning signs* for COVID-19. If someone is showing any of these signs, seek emergency medical care immediately: Trouble breathing Persistent pain or pressure in the chest New confusion Inability to wake or stay awake Pale, gray, or blue-colored skin, lips, or nail beds, depending on skin tone *This list is not all possible symptoms. Please call your medical provider for any other symptoms that are severe or concerning to you. Call 911 or call ahead to your local emergency facility: Notify the operator that you are seeking care for someone who has or may have COVID-19. Call ahead before visiting your doctor Call ahead. Many medical visits for routine care are being postponed or done by phone or telemedicine. If you have a medical appointment that cannot be postponed, call your doctor's office, and tell them you have or may have COVID-19. This will help the office protect themselves and other patients. If you are sick, wear a well-fitting mask You should wear a mask if you must be around other people or animals, including pets (even at home). Wear a mask with the best fit, protection, and comfort for you. You don't need to wear the mask if you are alone. If you can't put on a mask (because of trouble breathing, for example), cover  your coughs and sneezes in some other way. Try to stay at least 6 feet away from other people. This will help protect the people around you. Masks should not be placed on young children under age 15 years, anyone who has trouble breathing, or anyone who is not able to remove the mask without help. Cover your coughs and sneezes Cover your mouth and nose with a tissue when you cough or sneeze. Throw away used tissues in a lined trash can. Immediately wash your hands with soap and water for at least 20  seconds. If soap and water are not available, clean your hands with an alcohol-based hand sanitizer that contains at least 60% alcohol. Clean your hands often Wash your hands often with soap and water for at least 20 seconds. This is especially important after blowing your nose, coughing, or sneezing; going to the bathroom; and before eating or preparing food. Use hand sanitizer if soap and water are not available. Use an alcohol-based hand sanitizer with at least 60% alcohol, covering all surfaces of your hands and rubbing them together until they feel dry. Soap and water are the best option, especially if hands are visibly dirty. Avoid touching your eyes, nose, and mouth with unwashed hands. Handwashing Tips Avoid sharing personal household items Do not share dishes, drinking glasses, cups, eating utensils, towels, or bedding with other people in your home. Wash these items thoroughly after using them with soap and water or put in the dishwasher. Clean surfaces in your home regularly Clean and disinfect high-touch surfaces (for example, doorknobs, tables, handles, light switches, and countertops) in your sick room and bathroom. In shared spaces, you should clean and disinfect surfaces and items after each use by the person who is ill. If you are sick and cannot clean, a caregiver or other person should only clean and disinfect the area around you (such as your bedroom and bathroom) on an as needed  basis. Your caregiver/other person should wait as long as possible (at least several hours) and wear a mask before entering, cleaning, and disinfecting shared spaces that you use. Clean and disinfect areas that may have blood, stool, or body fluids on them. Use household cleaners and disinfectants. Clean visible dirty surfaces with household cleaners containing soap or detergent. Then, use a household disinfectant. Use a product from [employer] List N: Disinfectants for Coronavirus (COVID-19). Be sure to follow the instructions on the label to ensure safe and effective use of the product. Many products recommend keeping the surface wet with a disinfectant for a certain period of time (look at contact time on the product label). You may also need to wear personal protective equipment, such as gloves, depending on the directions on the product label. Immediately after disinfecting, wash your hands with soap and water for 20 seconds. For completed guidance on cleaning and disinfecting your home, visit Complete Disinfection Guidance. Take steps to improve ventilation at home Improve ventilation (air flow) at home to help prevent from spreading COVID-19 to other people in your household. Clear out COVID-19 virus particles in the air by opening windows, using air filters, and turning on fans in your home. Use this interactive tool to learn how to improve air flow in your home. When you can be around others after being sick with COVID-19 Deciding when you can be around others is different for different situations. Find out when you can safely end home isolation. For any additional questions about your care, contact your healthcare provider or state or local health department. 10/04/2020 Content source: [facility] ([facility]), Division of Viral Diseases This information is not intended to replace advice given to you by your health care provider. Make sure you discuss  any questions you have with your health care provider. Document Revised: 11/17/2020 Document Reviewed: 11/17/2020 Elsevier Patient Education  2022 [employer].     If you have been instructed to have an in-person evaluation today at a local Urgent Care facility, please use the link below. It will take you to a list of all  of our available [facility], including address, phone number and hours of operation. Please do not delay care.  [facility]  If you or a family member do not have a primary care provider, use the link below to schedule a visit and establish care. When you choose a [facility] primary care physician or advanced practice provider, you gain a long-term partner in health. Find a Primary Care Provider  Learn more about [facility]'s in-office and virtual care options: [facility] - Get Care Now

## 2024-07-17 ENCOUNTER — Inpatient Hospital Stay: Payer: Self-pay

## 2024-07-17 ENCOUNTER — Inpatient Hospital Stay: Payer: Self-pay | Admitting: Oncology

## 2024-07-23 ENCOUNTER — Other Ambulatory Visit: Payer: Self-pay

## 2024-07-23 ENCOUNTER — Emergency Department: Payer: Self-pay

## 2024-07-23 ENCOUNTER — Emergency Department
Admission: EM | Admit: 2024-07-23 | Discharge: 2024-07-23 | Disposition: A | Discharge status: Home / Self Care | Payer: Self-pay

## 2024-07-23 DIAGNOSIS — J441 Chronic obstructive pulmonary disease with (acute) exacerbation: Secondary | ICD-10-CM | POA: Insufficient documentation

## 2024-07-23 DIAGNOSIS — R790 Abnormal level of blood mineral: Secondary | ICD-10-CM

## 2024-07-23 DIAGNOSIS — R7401 Elevation of levels of liver transaminase levels: Secondary | ICD-10-CM | POA: Insufficient documentation

## 2024-07-23 DIAGNOSIS — F1020 Alcohol dependence, uncomplicated: Secondary | ICD-10-CM | POA: Insufficient documentation

## 2024-07-23 LAB — BASIC METABOLIC PANEL WITH GFR
Anion gap: 15 (ref 5–15)
BUN: 6 mg/dL (ref 6–20)
CO2: 25 mmol/L (ref 22–32)
Calcium: 9.3 mg/dL (ref 8.9–10.3)
Chloride: 98 mmol/L (ref 98–111)
Creatinine, Ser: 0.53 mg/dL (ref 0.44–1.00)
GFR, Estimated: >60 mL/min
Glucose, Bld: 156 mg/dL — ABNORMAL HIGH (ref 70–99)
Potassium: 3.8 mmol/L (ref 3.5–5.1)
Sodium: 138 mmol/L (ref 135–145)

## 2024-07-23 LAB — HEPATIC FUNCTION PANEL
ALT: 43 U/L (ref 0–44)
AST: 88 U/L — ABNORMAL HIGH (ref 15–41)
Albumin: 3.9 g/dL (ref 3.5–5.0)
Alkaline Phosphatase: 391 U/L — ABNORMAL HIGH (ref 38–126)
Bilirubin, Direct: 0.6 mg/dL — ABNORMAL HIGH (ref 0.0–0.2)
Indirect Bilirubin: 0.9 mg/dL (ref 0.3–0.9)
Total Bilirubin: 1.5 mg/dL — ABNORMAL HIGH (ref 0.0–1.2)
Total Protein: 7.2 g/dL (ref 6.5–8.1)

## 2024-07-23 LAB — CBC
HCT: 42.7 % (ref 36.0–46.0)
Hemoglobin: 14.7 g/dL (ref 12.0–15.0)
MCH: 32.7 pg (ref 26.0–34.0)
MCHC: 34.4 g/dL (ref 30.0–36.0)
MCV: 94.9 fL (ref 80.0–100.0)
Platelets: 41 K/uL — ABNORMAL LOW (ref 150–400)
RBC: 4.5 MIL/uL (ref 3.87–5.11)
RDW: 20.8 % — ABNORMAL HIGH (ref 11.5–15.5)
WBC: 7.7 K/uL (ref 4.0–10.5)
nRBC: 0 % (ref 0.0–0.2)

## 2024-07-23 LAB — BLOOD GAS, VENOUS
Acid-Base Excess: 7.4 mmol/L — ABNORMAL HIGH (ref 0.0–2.0)
Bicarbonate: 29.1 mmol/L — ABNORMAL HIGH (ref 20.0–28.0)
O2 Saturation: 69.3 %
Patient temperature: 37
pCO2, Ven: 31 mmHg — ABNORMAL LOW (ref 44–60)
pH, Ven: 7.58 — ABNORMAL HIGH (ref 7.25–7.43)
pO2, Ven: 38 mmHg (ref 32–45)

## 2024-07-23 LAB — URINE DRUG SCREEN
Amphetamines: NEGATIVE
Barbiturates: POSITIVE — AB
Benzodiazepines: NEGATIVE
Cocaine: NEGATIVE
Fentanyl: NEGATIVE
Methadone Scn, Ur: NEGATIVE
Opiates: NEGATIVE
Tetrahydrocannabinol: NEGATIVE

## 2024-07-23 LAB — RESP PANEL BY RT-PCR (RSV, FLU A&B, COVID)  RVPGX2
Influenza A by PCR: NEGATIVE
Influenza B by PCR: NEGATIVE
Resp Syncytial Virus by PCR: NEGATIVE
SARS Coronavirus 2 by RT PCR: NEGATIVE

## 2024-07-23 LAB — ETHANOL: Alcohol, Ethyl (B): <15 mg/dL

## 2024-07-23 LAB — TROPONIN T, HIGH SENSITIVITY
Troponin T High Sensitivity: 16 ng/L (ref 0–19)
Troponin T High Sensitivity: <15 ng/L (ref 0–19)

## 2024-07-23 LAB — LIPASE, BLOOD: Lipase: 58 U/L — ABNORMAL HIGH (ref 11–51)

## 2024-07-23 LAB — PRO BRAIN NATRIURETIC PEPTIDE: Pro Brain Natriuretic Peptide: 187 pg/mL

## 2024-07-23 LAB — MAGNESIUM: Magnesium: 1 mg/dL — ABNORMAL LOW (ref 1.7–2.4)

## 2024-07-23 MED ORDER — MAGNESIUM 30 MG PO TABS: 30.0000 mg | ORAL_TABLET | Freq: Two times a day (BID) | ORAL | 0 refills | Status: AC

## 2024-07-23 MED ORDER — SODIUM CHLORIDE 0.9 % IV BOLUS
500.0000 mL | Freq: Once | INTRAVENOUS | Status: AC
  Administered 2024-07-23: 500 mL via INTRAVENOUS

## 2024-07-23 MED ORDER — IPRATROPIUM-ALBUTEROL 0.5-2.5 (3) MG/3ML IN SOLN
3.0000 mL | Freq: Once | RESPIRATORY_TRACT | Status: AC
  Administered 2024-07-23: 3 mL via RESPIRATORY_TRACT
  Filled 2024-07-23: qty 3

## 2024-07-23 MED ORDER — ADULT MULTIVITAMIN W/MINERALS CH
1.0000 | ORAL_TABLET | Freq: Once | ORAL | Status: AC
  Administered 2024-07-23: 1 via ORAL
  Filled 2024-07-23: qty 1

## 2024-07-23 MED ORDER — PHENOBARBITAL SODIUM 130 MG/ML IJ SOLN
130.0000 mg | Freq: Once | INTRAMUSCULAR | Status: AC
  Administered 2024-07-23: 130 mg via INTRAVENOUS
  Filled 2024-07-23: qty 1

## 2024-07-23 MED ORDER — ALBUTEROL SULFATE (2.5 MG/3ML) 0.083% IN NEBU: 2.5000 mg | INHALATION_SOLUTION | RESPIRATORY_TRACT | 5 refills | Status: AC | PRN

## 2024-07-23 MED ORDER — METOPROLOL SUCCINATE ER 50 MG PO TB24
100.0000 mg | ORAL_TABLET | Freq: Once | ORAL | Status: AC
  Administered 2024-07-23: 100 mg via ORAL
  Filled 2024-07-23: qty 2

## 2024-07-23 MED ORDER — PREDNISONE 20 MG PO TABS: 20.0000 mg | ORAL_TABLET | Freq: Two times a day (BID) | ORAL | 0 refills | Status: AC

## 2024-07-23 MED ORDER — ALBUTEROL SULFATE HFA 108 (90 BASE) MCG/ACT IN AERS: 2.0000 | INHALATION_SPRAY | RESPIRATORY_TRACT | 3 refills | Status: AC | PRN

## 2024-07-23 MED ORDER — METHYLPREDNISOLONE SODIUM SUCC 125 MG IJ SOLR
125.0000 mg | Freq: Once | INTRAMUSCULAR | Status: AC
  Administered 2024-07-23: 125 mg via INTRAVENOUS
  Filled 2024-07-23: qty 2

## 2024-07-23 MED ORDER — THIAMINE MONONITRATE 100 MG PO TABS
100.0000 mg | ORAL_TABLET | Freq: Once | ORAL | Status: AC
  Administered 2024-07-23: 100 mg via ORAL
  Filled 2024-07-23: qty 1

## 2024-07-23 MED ORDER — MAGNESIUM SULFATE 4 GM/100ML IV SOLN
4.0000 g | Freq: Once | INTRAVENOUS | Status: AC
  Administered 2024-07-23: 4 g via INTRAVENOUS
  Filled 2024-07-23: qty 100

## 2024-07-23 MED ORDER — FOLIC ACID 1 MG PO TABS
1.0000 mg | ORAL_TABLET | Freq: Once | ORAL | Status: AC
  Administered 2024-07-23: 1 mg via ORAL
  Filled 2024-07-23: qty 1

## 2024-07-23 NOTE — ED Triage Notes (Signed)
 Pt comes with sob for 4 days. Pt states hx of copd. Pt has used inhaler with no relief. Pt states nonproductive cough. Pt states pain to right sided from coughing and pulled a muscle.

## 2024-07-23 NOTE — ED Provider Notes (Signed)
 "  [facility] Provider Note    Event Date/Time   First MD Initiated Contact with Patient 07/23/24 [id number]     (approximate)   History   Shortness of Breath   HPI  Carly [name] is a 54 y.o. female with a history of anxiety, COPD, ITP, alcohol abuse who presents to the emergency department with 5 days of progressively worsening shortness of breath.  She reports that she has used her inhaler at home without relief.  She is coughing but denies any production.  Denies any chest pain.  States that she has not had any alcohol in 48 hours.  She does feel like if she may be mildly withdrawing.  Has not had any fevers or chills abdominal pain changes in urinary or bowel habits      Physical Exam   Triage Vital Signs: ED Triage Vitals  Encounter Vitals Group     BP 07/23/24 0712 117/67     Girls Systolic BP Percentile --      Girls Diastolic BP Percentile --      Boys Systolic BP Percentile --      Boys Diastolic BP Percentile --      Pulse Rate 07/23/24 0712 (!) 130     Resp 07/23/24 0712 (!) 24     Temp 07/23/24 0712 97.6 F (36.4 C)     Temp src --      SpO2 07/23/24 0712 97 %     Weight 07/23/24 0711 220 lb (99.8 kg)     Height 07/23/24 0711 5' 2 (1.575 m)     Head Circumference --      Peak Flow --      Pain Score 07/23/24 0711 3     Pain Loc --      Pain Education --      Exclude from Growth Chart --     Most recent vital signs: Vitals:   07/23/24 0830 07/23/24 1100  BP: 100/77 136/79  Pulse: (!) 112 93  Resp: (!) 22 (!) 22  Temp:    SpO2: 98% 98%    Nursing Triage Note reviewed. Vital signs reviewed and patients oxygen saturation is normoxic  General: Patient is well nourished, well developed, awake and alert, appears sob Head: Normocephalic and atraumatic Eyes: Normal inspection, extraocular muscles intact, no conjunctival pallor Ear, nose, throat: Normal external exam Neck: Normal range of motion, no stridor Respiratory: Patient is  in moderate respiratory distress, lungs with wheezes throughout.  Cardiovascular: Patient is tachycardic, RR  GI: Abd SNT with no guarding or rebound  Back: Normal inspection of the back with good strength and range of motion throughout all ext Extremities: pulses intact with good cap refills, no LE pitting edema or calf tenderness Neuro: The patient is alert and oriented to person, place, and time, appropriately conversive, with 5/5 bilat UE/LE strength, no gross motor or sensory defects noted. Coordination appears to be adequate.  Does have tongue fasciculations and hand tremors Skin: Warm, dry, and intact Psych: normal mood and affect, no SI or HI  ED Results / Procedures / Treatments   Labs (all labs ordered are listed, but only abnormal results are displayed) Labs Reviewed  BASIC METABOLIC PANEL WITH GFR - Abnormal; Notable for the following components:      Result Value   Glucose, Bld 156 (*)    All other components within normal limits  CBC - Abnormal; Notable for the following components:   RDW 20.8 (*)  Platelets 41 (*)    All other components within normal limits  MAGNESIUM  - Abnormal; Notable for the following components:   Magnesium  1.0 (*)    All other components within normal limits  HEPATIC FUNCTION PANEL - Abnormal; Notable for the following components:   AST 88 (*)    Alkaline Phosphatase 391 (*)    Total Bilirubin 1.5 (*)    Bilirubin, Direct 0.6 (*)    All other components within normal limits  LIPASE, BLOOD - Abnormal; Notable for the following components:   Lipase 58 (*)    All other components within normal limits  BLOOD GAS, VENOUS - Abnormal; Notable for the following components:   pH, Ven 7.58 (*)    pCO2, Ven 31 (*)    Bicarbonate 29.1 (*)    Acid-Base Excess 7.4 (*)    All other components within normal limits  URINE DRUG SCREEN - Abnormal; Notable for the following components:   Barbiturates POSITIVE (*)    All other components within normal  limits  RESP PANEL BY RT-PCR (RSV, FLU A&B, COVID)  [id number]  PRO BRAIN NATRIURETIC PEPTIDE  ETHANOL  TROPONIN T, HIGH SENSITIVITY  TROPONIN T, HIGH SENSITIVITY     EKG EKG and rhythm strip are interpreted by myself:   EKG: tachycardic sinus rhythm] at heart rate of 200, normal QRS duration, QTc 313, nonspecific ST segments and T waves no ectopy EKG not consistent with Acute STEMI Rhythm strip: tachycardic in lead II  EKG and rhythm strip are interpreted by myself:   EKG: [Normal sinus rhythm] at heart rate of 123, normal QRS duration, QTc 476, nonspecific ST segments and T waves no ectopy EKG not consistent with Acute STEMI Rhythm strip: tachycardic in lead II  RADIOLOGY Xray chest: No acute abnormality on my independent review interpretation radiologist agrees    PROCEDURES:  Critical Care performed: Yes, see critical care procedure note(s)  .Critical Care  Performed by: Nicholaus Rolland BRAVO, MD Authorized by: Nicholaus Rolland BRAVO, MD   Critical care provider statement:    Critical care time (minutes):  30   Critical care was time spent personally by me on the following activities:  Development of treatment plan with patient or surrogate, discussions with consultants, evaluation of patient's response to treatment, examination of patient, ordering and review of laboratory studies, ordering and review of radiographic studies, ordering and performing treatments and interventions, pulse oximetry, re-evaluation of patient's condition and review of old charts Comments:     Patient was given 3 DuoNebs in succession with frequent reevaluations, also had evidence of alcohol withdrawal requiring 130 of phenobarbital     MEDICATIONS ORDERED IN ED: Medications  methylPREDNISolone  sodium succinate (SOLU-MEDROL ) 125 mg/2 mL injection 125 mg (125 mg Intravenous Given 07/23/24 0745)  ipratropium-albuterol  (DUONEB) 0.5-2.5 (3) MG/3ML nebulizer solution 3 mL (3 mLs Nebulization Given 07/23/24 0746)   PHENObarbital  (LUMINAL) injection 130 mg (130 mg Intravenous Given 07/23/24 0745)  metoprolol  succinate (TOPROL -XL) 24 hr tablet 100 mg (100 mg Oral Given 07/23/24 0808)  magnesium  sulfate IVPB 4 g 100 mL (0 g Intravenous Stopped 07/23/24 1055)  sodium chloride  0.9 % bolus 500 mL (0 mLs Intravenous Stopped 07/23/24 1124)  thiamine  (VITAMIN B1) tablet 100 mg (100 mg Oral Given 07/23/24 0847)  folic acid  (FOLVITE ) tablet 1 mg (1 mg Oral Given 07/23/24 0825)  multivitamin with minerals tablet 1 tablet (1 tablet Oral Given 07/23/24 0825)     IMPRESSION / MDM / ASSESSMENT AND PLAN / ED COURSE  Differential diagnosis includes, but is not limited to: COPD exacerbation URI, CHF, alcohol withdrawal, pneumonia, electrolyte derangement anemia  ED course: Patient presents with obvious bronchospasm and seems to be in moderate respiratory distress.  Patient was given 3 DuoNebs in succession and had improvement in her symptoms.  Chest x-ray was unremarkable for pneumonia.  I did administer 125 mg of Solu-Medrol .  I also administered 130 of phenobarbital  given concern for alcohol withdrawal.  She was also administered her home rate controlling blood pressure medication.  She had no profound electrolyte derangements, no leukocytosis or profound anemia.  Her blood gas demonstrated no acidosis or hypercarbia.  She did have slight elevation of her liver function labs however she is nontender in this region and I suspect this is secondary to her ongoing alcohol use.  Her magnesium  was very low and this was repleted with 4 g.  Patient improved and she was able to ambulate without shortness of breath.  I have refilled her nebulizing treatments, given her 4 additional days of prednisone , given her several days of magnesium  and patient will recheck her magnesium  in 1 week's time at her primary care physician.  She will return if any acutely worsening symptoms   Clinical Course as of 07/23/24 1547  Thu  Jul 23, 2024  0820 Magnesium (!): 1.0 Reviewed low magnesium  will replete with 4 g slowly [HD]  0826 Pro Brain Natriuretic Peptide: 187.0 [HD]  0826 Troponin T High Sensitivity: 16 [HD]  1001 Resp panel by RT-PCR (RSV, Flu A&B, Covid) Anterior Nasal Swab negative [HD]  1011 Patient feels improved, repeat pulmonary exam demonstrates improved aeration.  Still has some wheezes will order 1 more DuoNeb but patient will likely be able to go home today [HD]  1114 Patient reassessed.  Feels comfortable returning home and request discharge.  Repeat pulmonary exam much improved.  Will send her out with 4 additional days of steroids, refills of her breathing treatments and close primary care physician follow-up [HD]    Clinical Course User Index [HD] Nicholaus Rolland BRAVO, MD   At time of discharge there is no evidence of acute life, limb, vision, or fertility threat. Patient has stable vital signs, pain is well controlled, patient is ambulatory and p.o. tolerant.  Discharge instructions were completed using the EPIC system. I would refer you to those at this time. All warnings prescriptions follow-up etc. were discussed in detail with the patient. Patient indicates understanding and is agreeable with this plan. All questions answered.  Patient is made aware that they may return to the emergency department for any worsening or new condition or for any other emergency.   -- Risk: 5 This patient has a high risk of morbidity due to further diagnostic testing or treatment. Rationale: This patients evaluation and management involve a high risk of morbidity due to the potential severity of presenting symptoms, need for diagnostic testing, and/or initiation of treatment that may require close monitoring. The differential includes conditions with potential for significant deterioration or requiring escalation of care. Treatment decisions in the ED, including medication administration, procedural interventions, or  disposition planning, reflect this level of risk. COPA: 5 The patient has the following acute or chronic illness/injury that poses a possible threat to life or bodily function: [X] : The patient has a potentially serious acute condition or an acute exacerbation of a chronic illness requiring urgent evaluation and management in the Emergency Department. The clinical presentation necessitates immediate consideration of life-threatening or function-threatening diagnoses, even if they are ultimately ruled  out.   FINAL CLINICAL IMPRESSION(S) / ED DIAGNOSES   Final diagnoses:  COPD exacerbation (HCC)  Uncomplicated alcohol dependence (HCC)  Low magnesium  level     Rx / DC Orders   ED Discharge Orders          Ordered    predniSONE  (DELTASONE ) 20 MG tablet  2 times daily with meals        07/23/24 1117    magnesium  30 MG tablet  2 times daily        07/23/24 1117    albuterol  (PROVENTIL ) (2.5 MG/3ML) 0.083% nebulizer solution  Every 4 hours PRN        07/23/24 1117    albuterol  (VENTOLIN  HFA) 108 (90 Base) MCG/ACT inhaler  Every 4 hours PRN        07/23/24 1118             Note:  This document was prepared using Dragon voice recognition software and may include unintentional dictation errors.   Nicholaus Rolland BRAVO, MD 07/23/24 1547  "

## 2024-07-23 NOTE — Discharge Instructions (Addendum)
 For the next 48 hours, use a nebulizing treatment or an inhaler every 4-6 hours while awake.  Your next dose of prednisone  will be due tomorrow morning.  Please avoid exerting yourself.  Return with any acutely worsening symptoms or any other emergency and follow-up with your primary care physician as soon as possible. -- RETURN PRECAUTIONS & AFTERCARE: (ENGLISH) RETURN PRECAUTIONS: Return immediately to the emergency department or see/call your doctor if you feel worse, weak or have changes in speech or vision, are short of breath, have fever, vomiting, pain, bleeding or dark stool, trouble urinating or any new issues. Return here or see/call your doctor if not improving as expected for your suspected condition. FOLLOW-UP CARE: Call your doctor and/or any doctors we referred you to for more advice and to make an appointment. Do this today, tomorrow or after the weekend. Some doctors only take PPO insurance so if you have HMO insurance you may want to contact your HMO or your regular doctor for referral to a specialist within your plan. Either way tell the doctor's office that it was a referral from the emergency department so you get the soonest possible appointment.  YOUR TEST RESULTS: Take result reports of any blood or urine tests, imaging tests and EKG's to your doctor and any referral doctor. Have any abnormal tests repeated. Your doctor or a referral doctor can let you know when this should be done. Also make sure your doctor contacts this hospital to get any test results that are not currently available such as cultures or special tests for infection and final imaging reports, which are often not available at the time you leave the ER but which may list additional important findings that are not documented on the preliminary report. BLOOD PRESSURE: If your blood pressure was greater than 120/80 have your blood pressure rechecked within 1 to 2 weeks. MEDICATION SIDE EFFECTS: Do not drive, walk, bike,  take the bus, etc. if you have received or are being prescribed any sedating medications such as those for pain or anxiety or certain antihistamines like Benadryl . If you have been give one of these here get a taxi home or have a friend drive you home. Ask your pharmacist to counsel you on potential side effects of any new medication

## 2024-07-27 ENCOUNTER — Encounter: Payer: Self-pay | Admitting: Oncology

## 2024-07-29 ENCOUNTER — Encounter

## 2024-07-30 ENCOUNTER — Encounter: Payer: Self-pay | Admitting: Oncology

## 2024-08-03 ENCOUNTER — Inpatient Hospital Stay: Payer: Self-pay | Admitting: Oncology

## 2024-08-03 ENCOUNTER — Inpatient Hospital Stay: Payer: Self-pay

## 2024-08-03 ENCOUNTER — Encounter: Payer: Self-pay | Admitting: Oncology

## 2024-08-08 ENCOUNTER — Encounter (HOSPITAL_COMMUNITY): Payer: Self-pay

## 2024-08-08 ENCOUNTER — Inpatient Hospital Stay (HOSPITAL_COMMUNITY)

## 2024-08-08 ENCOUNTER — Emergency Department (HOSPITAL_COMMUNITY)

## 2024-08-08 ENCOUNTER — Other Ambulatory Visit: Payer: Self-pay

## 2024-08-08 ENCOUNTER — Inpatient Hospital Stay (HOSPITAL_COMMUNITY)
Admission: EM | Admit: 2024-08-08 | Discharge: 2024-08-17 | DRG: 964 | Disposition: A | Discharge status: Rehabilitation Facility | Attending: General Surgery | Admitting: General Surgery

## 2024-08-08 ENCOUNTER — Encounter: Payer: Self-pay | Admitting: Oncology

## 2024-08-08 DIAGNOSIS — F3181 Bipolar II disorder: Secondary | ICD-10-CM | POA: Diagnosis present

## 2024-08-08 DIAGNOSIS — K760 Fatty (change of) liver, not elsewhere classified: Secondary | ICD-10-CM | POA: Diagnosis present

## 2024-08-08 DIAGNOSIS — S32058A Other fracture of fifth lumbar vertebra, initial encounter for closed fracture: Secondary | ICD-10-CM | POA: Diagnosis present

## 2024-08-08 DIAGNOSIS — S066XAA Traumatic subarachnoid hemorrhage with loss of consciousness status unknown, initial encounter: Secondary | ICD-10-CM | POA: Diagnosis present

## 2024-08-08 DIAGNOSIS — S32048A Other fracture of fourth lumbar vertebra, initial encounter for closed fracture: Secondary | ICD-10-CM | POA: Diagnosis present

## 2024-08-08 DIAGNOSIS — Z823 Family history of stroke: Secondary | ICD-10-CM | POA: Diagnosis not present

## 2024-08-08 DIAGNOSIS — S32591A Other specified fracture of right pubis, initial encounter for closed fracture: Secondary | ICD-10-CM | POA: Diagnosis present

## 2024-08-08 DIAGNOSIS — S2239XA Fracture of one rib, unspecified side, initial encounter for closed fracture: Secondary | ICD-10-CM

## 2024-08-08 DIAGNOSIS — I6200 Nontraumatic subdural hemorrhage, unspecified: Secondary | ICD-10-CM

## 2024-08-08 DIAGNOSIS — R000 Tachycardia, unspecified: Secondary | ICD-10-CM | POA: Diagnosis present

## 2024-08-08 DIAGNOSIS — J439 Emphysema, unspecified: Secondary | ICD-10-CM | POA: Diagnosis present

## 2024-08-08 DIAGNOSIS — I10 Essential (primary) hypertension: Secondary | ICD-10-CM | POA: Diagnosis present

## 2024-08-08 DIAGNOSIS — I609 Nontraumatic subarachnoid hemorrhage, unspecified: Secondary | ICD-10-CM | POA: Diagnosis present

## 2024-08-08 DIAGNOSIS — S32599A Other specified fracture of unspecified pubis, initial encounter for closed fracture: Secondary | ICD-10-CM

## 2024-08-08 DIAGNOSIS — S36892A Contusion of other intra-abdominal organs, initial encounter: Secondary | ICD-10-CM | POA: Diagnosis present

## 2024-08-08 DIAGNOSIS — Z83438 Family history of other disorder of lipoprotein metabolism and other lipidemia: Secondary | ICD-10-CM | POA: Diagnosis not present

## 2024-08-08 DIAGNOSIS — S32038A Other fracture of third lumbar vertebra, initial encounter for closed fracture: Secondary | ICD-10-CM | POA: Diagnosis present

## 2024-08-08 DIAGNOSIS — R079 Chest pain, unspecified: Secondary | ICD-10-CM

## 2024-08-08 DIAGNOSIS — F10139 Alcohol abuse with withdrawal, unspecified: Secondary | ICD-10-CM | POA: Diagnosis not present

## 2024-08-08 DIAGNOSIS — Z8249 Family history of ischemic heart disease and other diseases of the circulatory system: Secondary | ICD-10-CM

## 2024-08-08 DIAGNOSIS — F411 Generalized anxiety disorder: Secondary | ICD-10-CM | POA: Diagnosis present

## 2024-08-08 DIAGNOSIS — S2231XA Fracture of one rib, right side, initial encounter for closed fracture: Secondary | ICD-10-CM | POA: Diagnosis present

## 2024-08-08 DIAGNOSIS — S065XAA Traumatic subdural hemorrhage with loss of consciousness status unknown, initial encounter: Secondary | ICD-10-CM | POA: Diagnosis present

## 2024-08-08 DIAGNOSIS — S32009A Unspecified fracture of unspecified lumbar vertebra, initial encounter for closed fracture: Secondary | ICD-10-CM

## 2024-08-08 DIAGNOSIS — K219 Gastro-esophageal reflux disease without esophagitis: Secondary | ICD-10-CM | POA: Diagnosis present

## 2024-08-08 DIAGNOSIS — S12090A Other displaced fracture of first cervical vertebra, initial encounter for closed fracture: Secondary | ICD-10-CM | POA: Diagnosis present

## 2024-08-08 DIAGNOSIS — Z818 Family history of other mental and behavioral disorders: Secondary | ICD-10-CM

## 2024-08-08 DIAGNOSIS — D693 Immune thrombocytopenic purpura: Secondary | ICD-10-CM | POA: Diagnosis present

## 2024-08-08 DIAGNOSIS — S32028A Other fracture of second lumbar vertebra, initial encounter for closed fracture: Secondary | ICD-10-CM | POA: Diagnosis present

## 2024-08-08 DIAGNOSIS — Z833 Family history of diabetes mellitus: Secondary | ICD-10-CM | POA: Diagnosis not present

## 2024-08-08 DIAGNOSIS — F17210 Nicotine dependence, cigarettes, uncomplicated: Secondary | ICD-10-CM | POA: Diagnosis present

## 2024-08-08 DIAGNOSIS — F17290 Nicotine dependence, other tobacco product, uncomplicated: Secondary | ICD-10-CM | POA: Diagnosis present

## 2024-08-08 DIAGNOSIS — Y92410 Unspecified street and highway as the place of occurrence of the external cause: Secondary | ICD-10-CM

## 2024-08-08 DIAGNOSIS — Z79899 Other long term (current) drug therapy: Secondary | ICD-10-CM

## 2024-08-08 DIAGNOSIS — S0191XA Laceration without foreign body of unspecified part of head, initial encounter: Secondary | ICD-10-CM

## 2024-08-08 LAB — SAMPLE TO BLOOD BANK

## 2024-08-08 LAB — COMPREHENSIVE METABOLIC PANEL WITH GFR
ALT: 85 U/L — ABNORMAL HIGH (ref 0–44)
AST: 359 U/L — ABNORMAL HIGH (ref 15–41)
Albumin: 3.7 g/dL (ref 3.5–5.0)
Alkaline Phosphatase: 289 U/L — ABNORMAL HIGH (ref 38–126)
Anion gap: 14 (ref 5–15)
BUN: 8 mg/dL (ref 6–20)
CO2: 21 mmol/L — ABNORMAL LOW (ref 22–32)
Calcium: 8.9 mg/dL (ref 8.9–10.3)
Chloride: 100 mmol/L (ref 98–111)
Creatinine, Ser: 0.66 mg/dL (ref 0.44–1.00)
GFR, Estimated: >60 mL/min
Glucose, Bld: 170 mg/dL — ABNORMAL HIGH (ref 70–99)
Potassium: 5.2 mmol/L — ABNORMAL HIGH (ref 3.5–5.1)
Sodium: 135 mmol/L (ref 135–145)
Total Bilirubin: 0.6 mg/dL (ref 0.0–1.2)
Total Protein: 7.1 g/dL (ref 6.5–8.1)

## 2024-08-08 LAB — ECHOCARDIOGRAM COMPLETE
Height: 63 in
S' Lateral: 2.2 cm
Weight: 3520 [oz_av]

## 2024-08-08 LAB — I-STAT CHEM 8, ED
BUN: 8 mg/dL (ref 6–20)
Calcium, Ion: 1.01 mmol/L — ABNORMAL LOW (ref 1.15–1.40)
Chloride: 101 mmol/L (ref 98–111)
Creatinine, Ser: 0.8 mg/dL (ref 0.44–1.00)
Glucose, Bld: 168 mg/dL — ABNORMAL HIGH (ref 70–99)
HCT: 49 % — ABNORMAL HIGH (ref 36.0–46.0)
Hemoglobin: 16.7 g/dL — ABNORMAL HIGH (ref 12.0–15.0)
Potassium: 5.1 mmol/L (ref 3.5–5.1)
Sodium: 138 mmol/L (ref 135–145)
TCO2: 22 mmol/L (ref 22–32)

## 2024-08-08 LAB — PROTIME-INR
INR: 1 (ref 0.8–1.2)
Prothrombin Time: 14.1 s (ref 11.4–15.2)

## 2024-08-08 LAB — URINALYSIS, ROUTINE W REFLEX MICROSCOPIC
Bilirubin Urine: NEGATIVE
Glucose, UA: NEGATIVE mg/dL
Ketones, ur: NEGATIVE mg/dL
Leukocytes,Ua: NEGATIVE
Nitrite: NEGATIVE
Protein, ur: NEGATIVE mg/dL
Specific Gravity, Urine: 1.056 — ABNORMAL HIGH (ref 1.005–1.030)
pH: 6 (ref 5.0–8.0)

## 2024-08-08 LAB — I-STAT CG4 LACTIC ACID, ED: Lactic Acid, Venous: 3.8 mmol/L (ref 0.5–1.9)

## 2024-08-08 LAB — CBC
HCT: 44.9 % (ref 36.0–46.0)
Hemoglobin: 14.8 g/dL (ref 12.0–15.0)
MCH: 32.7 pg (ref 26.0–34.0)
MCHC: 33 g/dL (ref 30.0–36.0)
MCV: 99.3 fL (ref 80.0–100.0)
Platelets: 89 10*3/uL — ABNORMAL LOW (ref 150–400)
RBC: 4.52 MIL/uL (ref 3.87–5.11)
RDW: 19.7 % — ABNORMAL HIGH (ref 11.5–15.5)
WBC: 14.4 10*3/uL — ABNORMAL HIGH (ref 4.0–10.5)
nRBC: 0 % (ref 0.0–0.2)

## 2024-08-08 LAB — TROPONIN T, HIGH SENSITIVITY
Troponin T High Sensitivity: 30 ng/L — ABNORMAL HIGH (ref 0–19)
Troponin T High Sensitivity: 37 ng/L — ABNORMAL HIGH (ref 0–19)

## 2024-08-08 LAB — ETHANOL: Alcohol, Ethyl (B): <15 mg/dL

## 2024-08-08 MED ORDER — FOLIC ACID 1 MG PO TABS
1.0000 mg | ORAL_TABLET | Freq: Every day | ORAL | Status: DC
  Administered 2024-08-08 – 2024-08-17 (×10): 1 mg via ORAL
  Filled 2024-08-08 (×10): qty 1

## 2024-08-08 MED ORDER — MORPHINE SULFATE (PF) 4 MG/ML IV SOLN
4.0000 mg | Freq: Once | INTRAVENOUS | Status: AC
  Administered 2024-08-08: 4 mg via INTRAVENOUS
  Filled 2024-08-08: qty 1

## 2024-08-08 MED ORDER — OXYCODONE HCL 5 MG PO TABS
5.0000 mg | ORAL_TABLET | Freq: Four times a day (QID) | ORAL | Status: DC | PRN
  Administered 2024-08-08 – 2024-08-09 (×2): 5 mg via ORAL
  Filled 2024-08-08 (×2): qty 1

## 2024-08-08 MED ORDER — ESCITALOPRAM OXALATE 10 MG PO TABS: 10.0000 mg | ORAL_TABLET | Freq: Every day | ORAL | Status: DC

## 2024-08-08 MED ORDER — ONDANSETRON HCL 4 MG/2ML IJ SOLN: 4.0000 mg | Freq: Four times a day (QID) | INTRAMUSCULAR | Status: DC | PRN

## 2024-08-08 MED ORDER — SENNA 8.6 MG PO TABS
1.0000 | ORAL_TABLET | Freq: Two times a day (BID) | ORAL | Status: DC
  Administered 2024-08-08 – 2024-08-11 (×5): 8.6 mg via ORAL
  Filled 2024-08-08 (×5): qty 1

## 2024-08-08 MED ORDER — LAMOTRIGINE 100 MG PO TABS: 100.0000 mg | ORAL_TABLET | Freq: Every day | ORAL | Status: DC

## 2024-08-08 MED ORDER — ARIPIPRAZOLE 5 MG PO TABS: 5.0000 mg | ORAL_TABLET | Freq: Every day | ORAL | Status: DC

## 2024-08-08 MED ORDER — METHOCARBAMOL 1000 MG/10ML IJ SOLN
500.0000 mg | Freq: Three times a day (TID) | INTRAMUSCULAR | Status: AC
  Administered 2024-08-08 – 2024-08-09 (×2): 500 mg via INTRAVENOUS
  Filled 2024-08-08 (×3): qty 10

## 2024-08-08 MED ORDER — ORAL CARE MOUTH RINSE: 15.0000 mL | OROMUCOSAL | Status: DC | PRN

## 2024-08-08 MED ORDER — LACTATED RINGERS IV BOLUS
1000.0000 mL | Freq: Once | INTRAVENOUS | Status: AC
  Administered 2024-08-08: 1000 mL via INTRAVENOUS

## 2024-08-08 MED ORDER — LACTATED RINGERS IV SOLN: INTRAVENOUS | Status: AC

## 2024-08-08 MED ORDER — METOPROLOL SUCCINATE ER 50 MG PO TB24: 100.0000 mg | ORAL_TABLET | Freq: Every day | ORAL | Status: DC

## 2024-08-08 MED ORDER — ESCITALOPRAM OXALATE 10 MG PO TABS
10.0000 mg | ORAL_TABLET | Freq: Every day | ORAL | Status: DC
  Administered 2024-08-08 – 2024-08-17 (×10): 10 mg via ORAL
  Filled 2024-08-08 (×10): qty 1

## 2024-08-08 MED ORDER — THIAMINE HCL 100 MG/ML IJ SOLN
200.0000 mg | INTRAVENOUS | Status: DC
  Administered 2024-08-09: 200 mg via INTRAVENOUS
  Filled 2024-08-08 (×10): qty 2

## 2024-08-08 MED ORDER — THIAMINE MONONITRATE 100 MG PO TABS
100.0000 mg | ORAL_TABLET | ORAL | Status: DC
  Administered 2024-08-08 – 2024-08-16 (×8): 100 mg via ORAL
  Filled 2024-08-08 (×8): qty 1

## 2024-08-08 MED ORDER — METHOCARBAMOL 500 MG PO TABS
500.0000 mg | ORAL_TABLET | Freq: Three times a day (TID) | ORAL | Status: AC
  Administered 2024-08-09 – 2024-08-11 (×7): 500 mg via ORAL
  Filled 2024-08-08 (×7): qty 1

## 2024-08-08 MED ORDER — IPRATROPIUM-ALBUTEROL 0.5-2.5 (3) MG/3ML IN SOLN
3.0000 mL | Freq: Once | RESPIRATORY_TRACT | Status: AC
  Administered 2024-08-08: 3 mL via RESPIRATORY_TRACT
  Filled 2024-08-08: qty 3

## 2024-08-08 MED ORDER — LORAZEPAM 1 MG PO TABS
1.0000 mg | ORAL_TABLET | ORAL | Status: DC | PRN
  Administered 2024-08-08 – 2024-08-10 (×9): 1 mg via ORAL
  Filled 2024-08-08 (×9): qty 1

## 2024-08-08 MED ORDER — CHLORHEXIDINE GLUCONATE CLOTH 2 % EX PADS
6.0000 | MEDICATED_PAD | Freq: Every day | CUTANEOUS | Status: DC
  Administered 2024-08-08 – 2024-08-16 (×6): 6 via TOPICAL

## 2024-08-08 MED ORDER — HYDROMORPHONE HCL 1 MG/ML IJ SOLN
1.0000 mg | Freq: Once | INTRAMUSCULAR | Status: AC
  Administered 2024-08-08: 1 mg via INTRAVENOUS
  Filled 2024-08-08: qty 1

## 2024-08-08 MED ORDER — ARIPIPRAZOLE 5 MG PO TABS
5.0000 mg | ORAL_TABLET | Freq: Every day | ORAL | Status: DC
  Administered 2024-08-08 – 2024-08-17 (×10): 5 mg via ORAL
  Filled 2024-08-08 (×11): qty 1

## 2024-08-08 MED ORDER — PERFLUTREN LIPID MICROSPHERE
1.0000 mL | INTRAVENOUS | Status: AC | PRN
  Administered 2024-08-08: 6 mL via INTRAVENOUS

## 2024-08-08 MED ORDER — METOPROLOL SUCCINATE ER 50 MG PO TB24
100.0000 mg | ORAL_TABLET | Freq: Every day | ORAL | Status: DC
  Administered 2024-08-08 – 2024-08-17 (×10): 100 mg via ORAL
  Filled 2024-08-08 (×10): qty 2

## 2024-08-08 MED ORDER — HYDROXYZINE HCL 25 MG PO TABS
25.0000 mg | ORAL_TABLET | Freq: Two times a day (BID) | ORAL | Status: DC | PRN
  Administered 2024-08-08 – 2024-08-17 (×16): 25 mg via ORAL
  Filled 2024-08-08 (×16): qty 1

## 2024-08-08 MED ORDER — MELATONIN 3 MG PO TABS
3.0000 mg | ORAL_TABLET | Freq: Every evening | ORAL | Status: DC | PRN
  Administered 2024-08-09 – 2024-08-16 (×6): 3 mg via ORAL
  Filled 2024-08-08 (×6): qty 1

## 2024-08-08 MED ORDER — ACETAMINOPHEN 500 MG PO TABS
1000.0000 mg | ORAL_TABLET | Freq: Four times a day (QID) | ORAL | Status: DC
  Administered 2024-08-08 – 2024-08-17 (×31): 1000 mg via ORAL
  Filled 2024-08-08 (×31): qty 2

## 2024-08-08 MED ORDER — POLYETHYLENE GLYCOL 3350 17 G PO PACK: 17.0000 g | PACK | Freq: Every day | ORAL | Status: DC | PRN

## 2024-08-08 MED ORDER — SODIUM CHLORIDE 0.9 % IV BOLUS
1000.0000 mL | Freq: Once | INTRAVENOUS | Status: AC
  Administered 2024-08-08: 1000 mL via INTRAVENOUS

## 2024-08-08 MED ORDER — LORAZEPAM 2 MG/ML IJ SOLN
1.0000 mg | Freq: Once | INTRAMUSCULAR | Status: AC
  Administered 2024-08-08: 1 mg via INTRAVENOUS
  Filled 2024-08-08: qty 1

## 2024-08-08 MED ORDER — IPRATROPIUM BROMIDE 0.06 % NA SOLN
2.0000 | Freq: Two times a day (BID) | NASAL | Status: DC
  Administered 2024-08-08 – 2024-08-17 (×18): 2 via NASAL
  Filled 2024-08-08: qty 15

## 2024-08-08 MED ORDER — ADULT MULTIVITAMIN W/MINERALS CH
1.0000 | ORAL_TABLET | Freq: Every day | ORAL | Status: DC
  Administered 2024-08-08 – 2024-08-17 (×10): 1 via ORAL
  Filled 2024-08-08 (×10): qty 1

## 2024-08-08 MED ORDER — HYDRALAZINE HCL 20 MG/ML IJ SOLN
10.0000 mg | INTRAMUSCULAR | Status: DC | PRN
  Administered 2024-08-08: 10 mg via INTRAVENOUS
  Filled 2024-08-08: qty 1

## 2024-08-08 MED ORDER — METOPROLOL TARTRATE 5 MG/5ML IV SOLN
5.0000 mg | Freq: Four times a day (QID) | INTRAVENOUS | Status: DC | PRN
  Administered 2024-08-08: 5 mg via INTRAVENOUS
  Filled 2024-08-08: qty 5

## 2024-08-08 MED ORDER — ALBUTEROL SULFATE (2.5 MG/3ML) 0.083% IN NEBU
2.5000 mg | INHALATION_SOLUTION | RESPIRATORY_TRACT | Status: DC | PRN
  Administered 2024-08-09 – 2024-08-10 (×2): 2.5 mg via RESPIRATORY_TRACT
  Filled 2024-08-08 (×2): qty 3

## 2024-08-08 MED ORDER — ONDANSETRON 4 MG PO TBDP: 4.0000 mg | ORAL_TABLET | Freq: Four times a day (QID) | ORAL | Status: DC | PRN

## 2024-08-08 MED ORDER — LORAZEPAM 2 MG/ML IJ SOLN
1.0000 mg | INTRAMUSCULAR | Status: DC | PRN
  Administered 2024-08-09 (×2): 2 mg via INTRAVENOUS
  Filled 2024-08-08 (×2): qty 1

## 2024-08-08 MED ORDER — NICOTINE 14 MG/24HR TD PT24
14.0000 mg | MEDICATED_PATCH | Freq: Every day | TRANSDERMAL | Status: DC
  Administered 2024-08-08 – 2024-08-17 (×10): 14 mg via TRANSDERMAL
  Filled 2024-08-08 (×10): qty 1

## 2024-08-08 MED ORDER — IOHEXOL 350 MG/ML SOLN
75.0000 mL | Freq: Once | INTRAVENOUS | Status: AC | PRN
  Administered 2024-08-08: 75 mL via INTRAVENOUS

## 2024-08-08 MED ORDER — PANTOPRAZOLE SODIUM 40 MG PO TBEC
40.0000 mg | DELAYED_RELEASE_TABLET | Freq: Every day | ORAL | Status: DC
  Administered 2024-08-09 – 2024-08-17 (×9): 40 mg via ORAL
  Filled 2024-08-08 (×9): qty 1

## 2024-08-08 MED ORDER — BUDESON-GLYCOPYRROL-FORMOTEROL 160-9-4.8 MCG/ACT IN AERO
2.0000 | INHALATION_SPRAY | Freq: Two times a day (BID) | RESPIRATORY_TRACT | Status: DC
  Administered 2024-08-08 – 2024-08-12 (×9): 2 via RESPIRATORY_TRACT
  Filled 2024-08-08: qty 5.9

## 2024-08-08 MED ORDER — HYDROMORPHONE HCL 1 MG/ML IJ SOLN
0.5000 mg | INTRAMUSCULAR | Status: DC | PRN
  Administered 2024-08-08 (×2): 1 mg via INTRAVENOUS
  Filled 2024-08-08 (×2): qty 1

## 2024-08-08 NOTE — ED Notes (Signed)
 Patient transported to CT

## 2024-08-08 NOTE — ED Notes (Signed)
 Help get patient on the monitor into a gown did manual blood pressure patient is resting with nurse at bedside
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
## 2024-08-08 NOTE — Progress Notes (Signed)
" °  Echocardiogram 2D Echocardiogram has been performed.  Kearstin Learn 08/08/2024, 4:41 PM "

## 2024-08-08 NOTE — ED Notes (Signed)
 Aspen collar applied by Scientist, Research (medical)
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
## 2024-08-08 NOTE — Progress Notes (Signed)
 CT reviewed, small scattered SAH and small SDH along the anterior falx. No surgery required at this time. Follow up head CT in the morning. Formal consult to follow.

## 2024-08-08 NOTE — ED Notes (Signed)
 Pt states she drank a couple of beers this morning.
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
## 2024-08-08 NOTE — ED Notes (Signed)
 Echo at bedside

## 2024-08-08 NOTE — Progress Notes (Signed)
 Orthopedic Tech Progress Note Patient Details:  Carly [name] 15-Apr-1971 [id number] Aspen cervical collar was delivered to bedside. Ortho Devices Type of Ortho Device: Aspen cervical collar Ortho Device/Splint Interventions: Ordered      Massie BRAVO Tyria Springer 08/08/2024, 12:58 PM

## 2024-08-08 NOTE — ED Notes (Signed)
MD at bedside aware of BP

## 2024-08-08 NOTE — ED Notes (Signed)
 Dr. Randol and RN logrolled pt.

## 2024-08-08 NOTE — Consult Note (Signed)
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              For questions or updates, please contact [facility] Please consult [email or web address] for contact info under      Signed, Deshawnda Acrey A Linas Stepter, MD  08/08/2024 4:30 PM     [1]  Allergies Allergen Reactions   Wellbutrin  [Bupropion ]     Crazy    Meloxicam  Nausea Only   "
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
## 2024-08-08 NOTE — Progress Notes (Signed)
 Pts husband stated that pt typically drinks a couple bootleggers every day after work.  Husband reports pt had 3 bootleggers yesterday after work.  Pt also smokes about 1 pack per day.  MD notified and inquired about nicotine  patch.

## 2024-08-08 NOTE — Progress Notes (Signed)
 Orthopedic Tech Progress Note Patient Details:  [name] Oct 29, 1970 [id number] Level 2 Trauma  Patient ID: Carly [name] Carly [name], female   DOB: 1971-07-15, 54 y.o.   MRN: [id number]  Carly [name] Bar 08/08/2024, 11:39 AM

## 2024-08-08 NOTE — Progress Notes (Signed)
 Notified Dr. Paola of pts HR sustaining in the 130s.  Pt states mild anxiety and pain of 4/10 when spoken to but falls asleep when left alone.  Hx of anxiety and depression.  PRNs given for anxiety, pain, and HR with no change in HR.    VO from Dr. Paola: -1 L NS bolus  -Start home extended release Metoprolol  and other home meds now - Nicotine  patch 14mg   -Start CIWA protocol with PRN ativan 

## 2024-08-09 ENCOUNTER — Inpatient Hospital Stay (HOSPITAL_COMMUNITY)

## 2024-08-09 LAB — POCT I-STAT 7, (LYTES, BLD GAS, ICA,H+H)
Acid-Base Excess: 2 mmol/L (ref 0.0–2.0)
Bicarbonate: 25 mmol/L (ref 20.0–28.0)
Calcium, Ion: 1.12 mmol/L — ABNORMAL LOW (ref 1.15–1.40)
HCT: 37 % (ref 36.0–46.0)
Hemoglobin: 12.6 g/dL (ref 12.0–15.0)
O2 Saturation: 92 %
Patient temperature: 98
Potassium: 3.6 mmol/L (ref 3.5–5.1)
Sodium: 137 mmol/L (ref 135–145)
TCO2: 26 mmol/L (ref 22–32)
pCO2 arterial: 32.1 mmHg (ref 32–48)
pH, Arterial: 7.498 — ABNORMAL HIGH (ref 7.35–7.45)
pO2, Arterial: 55 mmHg — ABNORMAL LOW (ref 83–108)

## 2024-08-09 LAB — BASIC METABOLIC PANEL WITH GFR
Anion gap: 10 (ref 5–15)
BUN: 10 mg/dL (ref 6–20)
CO2: 24 mmol/L (ref 22–32)
Calcium: 8.2 mg/dL — ABNORMAL LOW (ref 8.9–10.3)
Chloride: 104 mmol/L (ref 98–111)
Creatinine, Ser: 0.69 mg/dL (ref 0.44–1.00)
GFR, Estimated: >60 mL/min
Glucose, Bld: 147 mg/dL — ABNORMAL HIGH (ref 70–99)
Potassium: 4 mmol/L (ref 3.5–5.1)
Sodium: 139 mmol/L (ref 135–145)

## 2024-08-09 LAB — CBC
HCT: 37.2 % (ref 36.0–46.0)
Hemoglobin: 12 g/dL (ref 12.0–15.0)
MCH: 32.3 pg (ref 26.0–34.0)
MCHC: 32.3 g/dL (ref 30.0–36.0)
MCV: 100 fL (ref 80.0–100.0)
Platelets: 55 10*3/uL — ABNORMAL LOW (ref 150–400)
RBC: 3.72 MIL/uL — ABNORMAL LOW (ref 3.87–5.11)
RDW: 19.7 % — ABNORMAL HIGH (ref 11.5–15.5)
WBC: 11.3 10*3/uL — ABNORMAL HIGH (ref 4.0–10.5)
nRBC: 0 % (ref 0.0–0.2)

## 2024-08-09 LAB — GLUCOSE, CAPILLARY
Glucose-Capillary: 145 mg/dL — ABNORMAL HIGH (ref 70–99)
Glucose-Capillary: 136 mg/dL — ABNORMAL HIGH (ref 70–99)

## 2024-08-09 LAB — PHOSPHORUS: Phosphorus: 3.7 mg/dL (ref 2.5–4.6)

## 2024-08-09 LAB — MAGNESIUM: Magnesium: 1.2 mg/dL — ABNORMAL LOW (ref 1.7–2.4)

## 2024-08-09 LAB — MRSA NEXT GEN BY PCR, NASAL: MRSA by PCR Next Gen: NOT DETECTED

## 2024-08-09 MED ORDER — POLYETHYLENE GLYCOL 3350 17 G PO PACK
17.0000 g | PACK | Freq: Two times a day (BID) | ORAL | Status: DC
  Administered 2024-08-09 – 2024-08-11 (×4): 17 g via ORAL
  Filled 2024-08-09 (×4): qty 1

## 2024-08-09 MED ORDER — MAGNESIUM SULFATE 4 GM/100ML IV SOLN
4.0000 g | Freq: Once | INTRAVENOUS | Status: AC
  Administered 2024-08-09: 4 g via INTRAVENOUS
  Filled 2024-08-09: qty 100

## 2024-08-09 MED ORDER — DEXMEDETOMIDINE HCL IN NACL 400 MCG/100ML IV SOLN
0.0000 ug/kg/h | INTRAVENOUS | Status: DC
  Administered 2024-08-09 – 2024-08-10 (×2): 0.4 ug/kg/h via INTRAVENOUS
  Administered 2024-08-10: 1.2 ug/kg/h via INTRAVENOUS
  Administered 2024-08-10: 1.1 ug/kg/h via INTRAVENOUS
  Administered 2024-08-10: 0.2 ug/kg/h via INTRAVENOUS
  Administered 2024-08-11: 1.3 ug/kg/h via INTRAVENOUS
  Administered 2024-08-11 (×2): 1 ug/kg/h via INTRAVENOUS
  Filled 2024-08-09 (×8): qty 100

## 2024-08-09 MED ORDER — LACTATED RINGERS IV BOLUS
500.0000 mL | Freq: Once | INTRAVENOUS | Status: AC
  Administered 2024-08-09: 500 mL via INTRAVENOUS

## 2024-08-09 MED ORDER — IPRATROPIUM-ALBUTEROL 0.5-2.5 (3) MG/3ML IN SOLN
3.0000 mL | RESPIRATORY_TRACT | Status: AC
  Administered 2024-08-09 – 2024-08-12 (×14): 3 mL via RESPIRATORY_TRACT
  Filled 2024-08-09 (×15): qty 3

## 2024-08-09 MED ORDER — FUROSEMIDE 10 MG/ML IJ SOLN
40.0000 mg | Freq: Once | INTRAMUSCULAR | Status: AC
  Administered 2024-08-09: 40 mg via INTRAVENOUS
  Filled 2024-08-09: qty 4

## 2024-08-09 MED ORDER — POTASSIUM CHLORIDE CRYS ER 20 MEQ PO TBCR
40.0000 meq | EXTENDED_RELEASE_TABLET | Freq: Once | ORAL | Status: AC
  Administered 2024-08-09: 40 meq via ORAL
  Filled 2024-08-09: qty 2

## 2024-08-09 MED ORDER — OXYCODONE HCL 5 MG PO TABS
5.0000 mg | ORAL_TABLET | ORAL | Status: DC | PRN
  Administered 2024-08-09 – 2024-08-10 (×4): 10 mg via ORAL
  Administered 2024-08-10: 5 mg via ORAL
  Administered 2024-08-10 – 2024-08-11 (×2): 10 mg via ORAL
  Administered 2024-08-11: 5 mg via ORAL
  Administered 2024-08-11 – 2024-08-15 (×18): 10 mg via ORAL
  Administered 2024-08-15: 5 mg via ORAL
  Administered 2024-08-15 – 2024-08-17 (×6): 10 mg via ORAL
  Filled 2024-08-09 (×23): qty 2
  Filled 2024-08-09: qty 1
  Filled 2024-08-09 (×7): qty 2
  Filled 2024-08-09: qty 1
  Filled 2024-08-09 (×3): qty 2

## 2024-08-09 NOTE — Progress Notes (Signed)
 Sinus tachycardia has resolved by chart review.  Consider repeat complete echo prior to hospital discharge or in close outpatient follow up to obtain better images, since echo offered limited clinical information while patient was tachycardic.  Cardiology will sign off, please call with questions.  Carly Littleton A Santo Zahradnik, MD

## 2024-08-09 NOTE — Progress Notes (Addendum)
 RN placed patient in posey restraint after she was witnessed to be attempting to get out of bed--Precedex  is at 1. Ativan  was also adminstered. Patients' vital signs have been stable throughout. (Ativan  administration at 1702).  At 1800, RN noted patient had removed BP cuff. A pressure was taken, and systolics were 60s (see significant values in flowsheets). RN stimulated patient, and rechecked a blood pressure. Patient is alert to year, and self, but disoriented to situation and location. Precedex  has been titrated down to 0.7.  RN simultaineously paged Dr. Teresa, and verbal orders to try 500cc bolus first.  Nil Xiong, RN

## 2024-08-09 NOTE — Progress Notes (Signed)
 Pharmacy Electrolyte Replacement  Recent Labs:  Recent Labs    08/09/24 0435 08/09/24 0634  K 4.0 3.6  MG 1.2*  --   PHOS 3.7  --   CREATININE 0.69  --     Low Critical Values (K </= 2.5, Phos </= 1, Mg </= 1) Present: None  MD Contacted: n/a  Plan:  Kcl 40 mEq PO x1 Mg IV 4g x1 Recheck with AM labs   Thank you for allowing pharmacy to be a part of this patients care.  Shelba Collier, PharmD, BCPS Clinical Pharmacist

## 2024-08-09 NOTE — Progress Notes (Addendum)
 Throughout the morning, patient has been alert and oriented x4. However, husband of the patient also noted the patient would have temporary lapses in judgement but would quickly re-orient.  Around 1600, RN was assessing patient and performed patient care when she was noted to be more restless, removing important medical devices, and appearing to be less and less understanding of the situation. However, patient reports why she is here appropriately, and answers all orientation questions.   Dr. Teresa also was incidentally rounding on 4N, and noted the behavior. MD verbally ordered Precedex  gtt, and to continue with CIWA protocol. No new orders for additional head CT at this time.  Smaran Gaus, RN
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
## 2024-08-10 ENCOUNTER — Inpatient Hospital Stay (HOSPITAL_COMMUNITY)

## 2024-08-10 MED ORDER — PHENOBARBITAL 32.4 MG PO TABS
97.2000 mg | ORAL_TABLET | Freq: Three times a day (TID) | ORAL | Status: AC
  Administered 2024-08-10 – 2024-08-11 (×3): 97.2 mg via ORAL
  Filled 2024-08-10 (×3): qty 3

## 2024-08-10 MED ORDER — FUROSEMIDE 10 MG/ML IJ SOLN
40.0000 mg | Freq: Once | INTRAMUSCULAR | Status: AC
  Administered 2024-08-10: 40 mg via INTRAVENOUS
  Filled 2024-08-10: qty 4

## 2024-08-10 MED ORDER — LORAZEPAM 2 MG/ML IJ SOLN
1.0000 mg | INTRAMUSCULAR | Status: DC | PRN
  Administered 2024-08-10 – 2024-08-12 (×3): 1 mg via INTRAVENOUS
  Filled 2024-08-10 (×3): qty 1

## 2024-08-10 MED ORDER — PHENOBARBITAL 32.4 MG PO TABS
32.4000 mg | ORAL_TABLET | Freq: Three times a day (TID) | ORAL | Status: AC
  Administered 2024-08-13 – 2024-08-15 (×6): 32.4 mg via ORAL
  Filled 2024-08-10 (×6): qty 1

## 2024-08-10 MED ORDER — WHITE PETROLATUM EX OINT
TOPICAL_OINTMENT | CUTANEOUS | Status: DC | PRN
  Administered 2024-08-10: 0.2 via TOPICAL
  Filled 2024-08-10: qty 28.35

## 2024-08-10 MED ORDER — PHENOBARBITAL 32.4 MG PO TABS
64.8000 mg | ORAL_TABLET | Freq: Three times a day (TID) | ORAL | Status: AC
  Administered 2024-08-11 – 2024-08-13 (×6): 64.8 mg via ORAL
  Filled 2024-08-10 (×7): qty 2
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
## 2024-08-10 NOTE — Discharge Instructions (Signed)
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
## 2024-08-10 NOTE — Progress Notes (Signed)
 Subjective: Patient reports doing ok, some headaches  Objective: Vital signs in last 24 hours: Temp:  [97.9 F (36.6 C)-99 F (37.2 C)] 97.9 F (36.6 C) (01/26 0700) Pulse Rate:  [81-101] 99 (01/26 1226) Resp:  [13-33] 13 (01/26 1200) BP: (60-146)/(39-110) 146/71 (01/26 1200) SpO2:  [90 %-100 %] 92 % (01/26 1200) FiO2 (%):  [36 %] 36 % (01/25 2000)  Intake/Output from previous day: 01/25 0701 - 01/26 0700 In: 1634.2 [P.O.:240; I.V.:741.9; IV Piggyback:652.3] Out: 750 [Urine:750] Intake/Output this shift: No intake/output data recorded.  Neurologic: Grossly normal  Lab Results: Lab Results  Component Value Date   WBC 11.3 (H) 08/09/2024   HGB 12.6 08/09/2024   HCT 37.0 08/09/2024   MCV 100.0 08/09/2024   PLT 55 (L) 08/09/2024   Lab Results  Component Value Date   INR 1.0 08/08/2024   BMET Lab Results  Component Value Date   NA 137 08/09/2024   K 3.6 08/09/2024   CL 104 08/09/2024   CO2 24 08/09/2024   GLUCOSE 147 (H) 08/09/2024   BUN 10 08/09/2024   CREATININE 0.69 08/09/2024   CALCIUM 8.2 (L) 08/09/2024    Studies/Results: DG Chest Port 1 View Result Date: 08/09/2024 CLINICAL DATA:  Respiratory failure. EXAM: PORTABLE CHEST 1 VIEW COMPARISON:  08/08/2024 FINDINGS: The lungs are clear without focal pneumonia, edema, pneumothorax or pleural effusion. Cardiopericardial silhouette is at upper limits of normal for size. Telemetry leads overlie the chest. IMPRESSION: No active disease. Electronically Signed   By: Camellia Candle M.D.   On: 08/09/2024 07:05   CT HEAD WO CONTRAST ( ) Result Date: 08/09/2024 EXAM: CT HEAD WITHOUT CONTRAST 08/09/2024 02:43:00 AM TECHNIQUE: CT of the head was performed without the administration of intravenous contrast. Automated exposure control, iterative reconstruction, and/or weight based adjustment of the mA/kV was utilized to reduce the radiation dose to as low as reasonably achievable. COMPARISON: CT Head 08/08/24 CLINICAL HISTORY:  Head trauma, skull fracture or hematoma (Age 72-64y) FINDINGS: BRAIN AND VENTRICLES: Unchanged subdural hemorrhage along the falx and multifocal bilateral subarachnoid hemorrhage. No evidence of acute infarct. No hydrocephalus. No extra-axial collection. No mass effect or midline shift. Chronic left thalamic calcification. ORBITS: No acute abnormality. SINUSES: Moderate left maxillary sinus mucosal thickening. SOFT TISSUES AND SKULL: No acute soft tissue abnormality. No skull fracture. OTHER: Unchanged left mastoid effusion. IMPRESSION: 1. Unchanged subdural hemorrhage along the falx and multifocal bilateral subarachnoid hemorrhage. Electronically signed by: Glendia Molt MD 08/09/2024 02:58 AM EST RP Workstation: [id number]   ECHOCARDIOGRAM COMPLETE Result Date: 08/08/2024    ECHOCARDIOGRAM REPORT   Patient Name:   Carly [name] Date of Exam: 08/08/2024 Medical Rec #:  [id number]      Height:       63.0 in Accession #:    [id number]     Weight:       220.0 lb Date of Birth:  1970-10-15       BSA:          2.014 m Patient Age:    54 years       BP:           190/105 mmHg Patient Gender: F              HR:           150 bpm. Exam Location:  Inpatient Procedure: 2D Echo (Both Spectral and Color Flow Doppler were utilized during            procedure). Indications:  chest pain  History:        Patient has prior history of Echocardiogram examinations, most                 recent 04/17/2023. COPD; Risk Factors:Hypertension and Current                 Smoker.  Sonographer:    Tinnie Barefoot RDCS Referring Phys: [id number] [id number] KNAPP  Sonographer Comments: Suboptimal subcostal window. Image acquisition challenging due to patient body habitus, Image acquisition challenging due to respiratory motion, Image acquisition challenging due to COPD and c collar. IMPRESSIONS  1. Severe LVH with basal septum measuring 2 cm. Left ventricular ejection fraction, by estimation, is 70 to 75%. The left ventricle has hyperdynamic function. Left  ventricular endocardial border not optimally defined to evaluate regional wall motion. There is severe asymmetric left ventricular hypertrophy of the basal-septal segment.  2. Right ventricular systolic function is normal. The right ventricular size is normal.  3. The mitral valve is normal in structure. No evidence of mitral valve regurgitation. No evidence of mitral stenosis.  4. The aortic valve was not well visualized. Conclusion(s)/Recommendation(s): I would repeat echo once patient is no longer tachycardia. There was likely evidence of dynamic LVOT obstruction in setting of hyperdynamic LV, but need to rule out HCM given severe LVH. FINDINGS  Left Ventricle: Severe LVH with basal septum measuring 2 cm. Left ventricular ejection fraction, by estimation, is 70 to 75%. The left ventricle has hyperdynamic function. Left ventricular endocardial border not optimally defined to evaluate regional wall motion. Definity  contrast agent was given IV to delineate the left ventricular endocardial borders. The left ventricular internal cavity size was normal in size. There is severe asymmetric left ventricular hypertrophy of the basal-septal segment. Right Ventricle: The right ventricular size is normal. No increase in right ventricular wall thickness. Right ventricular systolic function is normal. Left Atrium: Left atrial size was normal in size. Right Atrium: Right atrial size was normal in size. Pericardium: The pericardium was not well visualized. Mitral Valve: The mitral valve is normal in structure. No evidence of mitral valve regurgitation. No evidence of mitral valve stenosis. Tricuspid Valve: The tricuspid valve is normal in structure. Tricuspid valve regurgitation is not demonstrated. No evidence of tricuspid stenosis. Aortic Valve: The aortic valve was not well visualized. Pulmonic Valve: The pulmonic valve was not well visualized. Pulmonic valve regurgitation is not visualized. Aorta: The aortic root is normal in  size and structure. Venous: The inferior vena cava was not well visualized.  LEFT VENTRICLE PLAX 2D LVIDd:         3.30 cm LVIDs:         2.20 cm LV PW:         1.30 cm LV IVS:        1.40 cm LVOT diam:     1.80 cm LVOT Area:     2.54 cm  LEFT ATRIUM             Index        RIGHT ATRIUM           Index LA diam:        3.30 cm 1.64 cm/m   RA Area:     10.60 cm LA Vol (A2C):   30.4 ml 15.10 ml/m  RA Volume:   19.60 ml  9.73 ml/m LA Vol (A4C):   72.9 ml 36.20 ml/m LA Biplane Vol: 50.1 ml 24.88 ml/m   AORTA Ao Root diam:  3.10 cm  SHUNTS Systemic Diam: 1.80 cm Joelle Cedars Tonleu Electronically signed by Joelle Cedars Tonleu Signature Date/Time: 08/08/2024/5:11:20 PM    Final     Assessment/Plan: S/p CHI and cervical collar. Continue therapy. Doing well from our standpoint. Seems to be having some withdrawal symptoms last night.    LOS: 2 days    Suzen Lacks Koa Palla 08/10/2024, 1:05 PM

## 2024-08-11 LAB — CBC
HCT: 35.8 % — ABNORMAL LOW (ref 36.0–46.0)
Hemoglobin: 12.1 g/dL (ref 12.0–15.0)
MCH: 32.7 pg (ref 26.0–34.0)
MCHC: 33.8 g/dL (ref 30.0–36.0)
MCV: 96.8 fL (ref 80.0–100.0)
Platelets: 38 10*3/uL — ABNORMAL LOW (ref 150–400)
RBC: 3.7 MIL/uL — ABNORMAL LOW (ref 3.87–5.11)
RDW: 18.5 % — ABNORMAL HIGH (ref 11.5–15.5)
WBC: 8.7 10*3/uL (ref 4.0–10.5)
nRBC: 0 % (ref 0.0–0.2)

## 2024-08-11 LAB — BASIC METABOLIC PANEL WITH GFR
Anion gap: 11 (ref 5–15)
BUN: 8 mg/dL (ref 6–20)
CO2: 24 mmol/L (ref 22–32)
Calcium: 8.1 mg/dL — ABNORMAL LOW (ref 8.9–10.3)
Chloride: 99 mmol/L (ref 98–111)
Creatinine, Ser: 0.55 mg/dL (ref 0.44–1.00)
GFR, Estimated: >60 mL/min
Glucose, Bld: 135 mg/dL — ABNORMAL HIGH (ref 70–99)
Potassium: 3.6 mmol/L (ref 3.5–5.1)
Sodium: 134 mmol/L — ABNORMAL LOW (ref 135–145)

## 2024-08-11 LAB — MAGNESIUM: Magnesium: 1.2 mg/dL — ABNORMAL LOW (ref 1.7–2.4)

## 2024-08-11 MED ORDER — MAGNESIUM SULFATE 2 GM/50ML IV SOLN
2.0000 g | Freq: Once | INTRAVENOUS | Status: AC
  Administered 2024-08-11: 2 g via INTRAVENOUS
  Filled 2024-08-11: qty 50

## 2024-08-11 MED ORDER — ALBUMIN HUMAN 5 % IV SOLN
12.5000 g | Freq: Once | INTRAVENOUS | Status: AC
  Administered 2024-08-11: 12.5 g via INTRAVENOUS
  Filled 2024-08-11: qty 250

## 2024-08-11 MED ORDER — POTASSIUM CHLORIDE CRYS ER 20 MEQ PO TBCR
40.0000 meq | EXTENDED_RELEASE_TABLET | Freq: Once | ORAL | Status: AC
  Administered 2024-08-11: 40 meq via ORAL
  Filled 2024-08-11: qty 2

## 2024-08-11 MED ORDER — FUROSEMIDE 10 MG/ML IJ SOLN
40.0000 mg | Freq: Once | INTRAMUSCULAR | Status: AC
  Administered 2024-08-11: 40 mg via INTRAVENOUS
  Filled 2024-08-11: qty 4

## 2024-08-11 MED ORDER — HYDROMORPHONE HCL 1 MG/ML IJ SOLN: 0.5000 mg | INTRAMUSCULAR | Status: DC | PRN

## 2024-08-11 MED ORDER — SIMETHICONE 80 MG PO CHEW
80.0000 mg | CHEWABLE_TABLET | Freq: Four times a day (QID) | ORAL | Status: DC | PRN
  Administered 2024-08-11: 80 mg via ORAL
  Filled 2024-08-11: qty 1

## 2024-08-11 MED ORDER — MAGNESIUM SULFATE 4 GM/100ML IV SOLN
4.0000 g | Freq: Once | INTRAVENOUS | Status: AC
  Administered 2024-08-11: 4 g via INTRAVENOUS
  Filled 2024-08-11: qty 100

## 2024-08-11 MED ORDER — METHOCARBAMOL 500 MG PO TABS
1000.0000 mg | ORAL_TABLET | Freq: Three times a day (TID) | ORAL | Status: DC
  Administered 2024-08-11 – 2024-08-17 (×19): 1000 mg via ORAL
  Filled 2024-08-11 (×19): qty 2

## 2024-08-11 MED ORDER — BOOST / RESOURCE BREEZE PO LIQD CUSTOM
1.0000 | Freq: Three times a day (TID) | ORAL | Status: DC
  Administered 2024-08-11 – 2024-08-17 (×18): 1 via ORAL

## 2024-08-11 MED ORDER — LOPERAMIDE HCL 2 MG PO CAPS
2.0000 mg | ORAL_CAPSULE | ORAL | Status: DC | PRN
  Administered 2024-08-11: 2 mg via ORAL
  Filled 2024-08-11 (×3): qty 1
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
## 2024-08-11 NOTE — Progress Notes (Signed)
 Inpatient Rehab Admissions Coordinator Note:   Per therapy recommendations patient was screened for CIR candidacy by Reche FORBES Lowers, PT. At this time, pt appears to be a potential candidate for CIR. I will place an order for rehab consult for full assessment, per our protocol.  Note on precedex  gtt for withdrawal.  Please contact me any with questions.SABRA Reche Lowers, PT, DPT [phone] 08/11/24 3:05 PM
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
## 2024-08-11 NOTE — Progress Notes (Signed)
 Patient ID: Carly [name], female   DOB: August 14, 1970, 54 y.o.   MRN: [id number] Resting in bed comfortably, some disorientation but no agitation. In collar, moving all ext. No new recs.

## 2024-08-12 LAB — CBC
HCT: 36.4 % (ref 36.0–46.0)
Hemoglobin: 12.2 g/dL (ref 12.0–15.0)
MCH: 32.4 pg (ref 26.0–34.0)
MCHC: 33.5 g/dL (ref 30.0–36.0)
MCV: 96.8 fL (ref 80.0–100.0)
Platelets: 50 10*3/uL — ABNORMAL LOW (ref 150–400)
RBC: 3.76 MIL/uL — ABNORMAL LOW (ref 3.87–5.11)
RDW: 19 % — ABNORMAL HIGH (ref 11.5–15.5)
WBC: 8.7 10*3/uL (ref 4.0–10.5)
nRBC: 0 % (ref 0.0–0.2)

## 2024-08-12 LAB — BASIC METABOLIC PANEL WITH GFR
Anion gap: 9 (ref 5–15)
BUN: 7 mg/dL (ref 6–20)
CO2: 27 mmol/L (ref 22–32)
Calcium: 8.5 mg/dL — ABNORMAL LOW (ref 8.9–10.3)
Chloride: 99 mmol/L (ref 98–111)
Creatinine, Ser: 0.62 mg/dL (ref 0.44–1.00)
GFR, Estimated: >60 mL/min
Glucose, Bld: 115 mg/dL — ABNORMAL HIGH (ref 70–99)
Potassium: 3.9 mmol/L (ref 3.5–5.1)
Sodium: 135 mmol/L (ref 135–145)

## 2024-08-12 LAB — MAGNESIUM: Magnesium: 1.6 mg/dL — ABNORMAL LOW (ref 1.7–2.4)

## 2024-08-12 MED ORDER — POLYETHYLENE GLYCOL 3350 17 G PO PACK: 17.0000 g | PACK | Freq: Every day | ORAL | Status: DC | PRN

## 2024-08-12 MED ORDER — MAGNESIUM SULFATE 4 GM/100ML IV SOLN
4.0000 g | Freq: Once | INTRAVENOUS | Status: AC
  Administered 2024-08-12: 4 g via INTRAVENOUS
  Filled 2024-08-12: qty 100
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
## 2024-08-12 NOTE — TOC Progression Note (Signed)
 Transition of Care [facility]) - Progression Note    Patient Details  Name: Carly [name] MRN: [id number] Date of Birth: February 27, 1971  [facility] CM/SW Contact  Akshay Spang E Sharrod Achille, LCSW Phone Number: 08/12/2024, 1:38 PM  Clinical Narrative:    ICM continues to follow for CIR eligibility.                     Expected Discharge Plan and Services                                               Social Drivers of Health (SDOH) Interventions SDOH Screenings   Food Insecurity: No Food Insecurity (08/08/2024)  Housing: Low Risk (08/08/2024)  Transportation Needs: No Transportation Needs (08/08/2024)  Utilities: Not At Risk (08/08/2024)  Alcohol Screen: Medium Risk (01/11/2024)  Depression (PHQ2-9): Low Risk (05/12/2024)  Recent Concern: Depression (PHQ2-9) - Medium Risk (04/16/2024)  Social Connections: Moderately Isolated (08/07/2023)  Tobacco Use: High Risk (08/08/2024)    Readmission Risk Interventions     No data to display

## 2024-08-12 NOTE — Progress Notes (Signed)
 Inpatient Rehab Admissions Coordinator:   Attempted to reach pt and spouse via phone.  Left message on pt's phone, no answer/no VM on spouse's phone.  Will follow.  Reche Lowers, PT, DPT Admissions Coordinator [phone] 08/12/24 1:13 PM

## 2024-08-12 NOTE — Progress Notes (Signed)
 Orthopedic Tech Progress Note Patient Details:  Carly [name] 1970-07-26 [id number]  Patient ID: Carly [name], female   DOB: 1971/04/08, 54 y.o.   MRN: [id number] Replacement Aspen c-collar ordered from [employer]. Carly [name] 08/12/2024, 9:57 AM

## 2024-08-12 NOTE — Progress Notes (Signed)
 Patient ID: Carly [name], female   DOB: 06-23-1971, 54 y.o.   MRN: [id number] She looks good neurologically today.  She is sitting up in bed playing on her phone.  Denies headache.  Denies neck pain.  In her cervical collar.  No new recommendation
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
## 2024-08-12 NOTE — Plan of Care (Signed)
  Problem: Clinical Measurements: Goal: Ability to maintain clinical measurements within normal limits will improve Outcome: Progressing Goal: Respiratory complications will improve Outcome: Progressing Goal: Cardiovascular complication will be avoided Outcome: Progressing   Problem: Activity: Goal: Risk for activity intolerance will decrease Outcome: Progressing   Problem: Nutrition: Goal: Adequate nutrition will be maintained Outcome: Progressing   Problem: Coping: Goal: Level of anxiety will decrease Outcome: Progressing   Problem: Elimination: Goal: Will not experience complications related to urinary retention Outcome: Progressing   Problem: Pain Managment: Goal: General experience of comfort will improve and/or be controlled Outcome: Progressing

## 2024-08-13 LAB — BASIC METABOLIC PANEL WITH GFR
Anion gap: 9 (ref 5–15)
BUN: 7 mg/dL (ref 6–20)
CO2: 26 mmol/L (ref 22–32)
Calcium: 8.2 mg/dL — ABNORMAL LOW (ref 8.9–10.3)
Chloride: 100 mmol/L (ref 98–111)
Creatinine, Ser: 0.51 mg/dL (ref 0.44–1.00)
GFR, Estimated: >60 mL/min
Glucose, Bld: 115 mg/dL — ABNORMAL HIGH (ref 70–99)
Potassium: 3.5 mmol/L (ref 3.5–5.1)
Sodium: 135 mmol/L (ref 135–145)

## 2024-08-13 LAB — CBC
HCT: 36.3 % (ref 36.0–46.0)
Hemoglobin: 11.6 g/dL — ABNORMAL LOW (ref 12.0–15.0)
MCH: 31.8 pg (ref 26.0–34.0)
MCHC: 32 g/dL (ref 30.0–36.0)
MCV: 99.5 fL (ref 80.0–100.0)
Platelets: 50 10*3/uL — ABNORMAL LOW (ref 150–400)
RBC: 3.65 MIL/uL — ABNORMAL LOW (ref 3.87–5.11)
RDW: 18.6 % — ABNORMAL HIGH (ref 11.5–15.5)
WBC: 7.7 10*3/uL (ref 4.0–10.5)
nRBC: 0 % (ref 0.0–0.2)

## 2024-08-13 LAB — MAGNESIUM: Magnesium: 1.9 mg/dL (ref 1.7–2.4)

## 2024-08-13 MED ORDER — BUDESON-GLYCOPYRROL-FORMOTEROL 160-9-4.8 MCG/ACT IN AERO
2.0000 | INHALATION_SPRAY | Freq: Two times a day (BID) | RESPIRATORY_TRACT | Status: DC
  Administered 2024-08-13 – 2024-08-17 (×9): 2 via RESPIRATORY_TRACT

## 2024-08-13 NOTE — Progress Notes (Signed)
 Inpatient Rehab Admissions Coordinator:   Working with PT this afternoon when I called, will call back later after she finishes with them.    Reche Lowers, PT, DPT Admissions Coordinator [phone] 08/13/24 1:09 PM
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
## 2024-08-13 NOTE — Plan of Care (Signed)
" °  Problem: Clinical Measurements: Goal: Ability to maintain clinical measurements within normal limits will improve Outcome: Progressing Goal: Respiratory complications will improve Outcome: Progressing Goal: Cardiovascular complication will be avoided Outcome: Progressing   Problem: Activity: Goal: Risk for activity intolerance will decrease Outcome: Progressing   Problem: Nutrition: Goal: Adequate nutrition will be maintained Outcome: Progressing   Problem: Coping: Goal: Level of anxiety will decrease Outcome: Progressing   Problem: Elimination: Goal: Will not experience complications related to bowel motility Outcome: Progressing Goal: Will not experience complications related to urinary retention Outcome: Progressing   Problem: Pain Managment: Goal: General experience of comfort will improve and/or be controlled Outcome: Progressing   "

## 2024-08-13 NOTE — Progress Notes (Signed)
 Inpatient Rehab Coordinator Note:  I spoke with patient over the phone to discuss CIR recommendations and goals/expectations of CIR stay.  We reviewed 3 hrs/day of therapy, physician follow up, and average length of stay 2 weeks (dependent upon progress) with goals of modified independence. She notes she lives with her spouse who can provide 24/7 supervision if needed.  Home is one level with no steps to enter.  She confirms she does not currently have a payor and would like to be screened for medicaid.  I reviewed cost with her.  Currently open to rehab.  I will follow for potential admit pending bed availability.   Reche Lowers, PT, DPT Admissions Coordinator [phone] 08/13/24 3:04 PM
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
## 2024-08-14 LAB — CBC
HCT: 36.1 % (ref 36.0–46.0)
Hemoglobin: 11.9 g/dL — ABNORMAL LOW (ref 12.0–15.0)
MCH: 32.4 pg (ref 26.0–34.0)
MCHC: 33 g/dL (ref 30.0–36.0)
MCV: 98.4 fL (ref 80.0–100.0)
Platelets: 58 10*3/uL — ABNORMAL LOW (ref 150–400)
RBC: 3.67 MIL/uL — ABNORMAL LOW (ref 3.87–5.11)
RDW: 18.6 % — ABNORMAL HIGH (ref 11.5–15.5)
WBC: 8.1 10*3/uL (ref 4.0–10.5)
nRBC: 0 % (ref 0.0–0.2)

## 2024-08-14 LAB — BASIC METABOLIC PANEL WITH GFR
Anion gap: 8 (ref 5–15)
BUN: 8 mg/dL (ref 6–20)
CO2: 28 mmol/L (ref 22–32)
Calcium: 8.3 mg/dL — ABNORMAL LOW (ref 8.9–10.3)
Chloride: 101 mmol/L (ref 98–111)
Creatinine, Ser: 0.52 mg/dL (ref 0.44–1.00)
GFR, Estimated: >60 mL/min
Glucose, Bld: 117 mg/dL — ABNORMAL HIGH (ref 70–99)
Potassium: 3.6 mmol/L (ref 3.5–5.1)
Sodium: 136 mmol/L (ref 135–145)
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
## 2024-08-14 NOTE — Progress Notes (Signed)
 Pt IV removed by pt accidentally. PA-C Burnard Banter notified okay given to leave IV out.

## 2024-08-14 NOTE — Progress Notes (Addendum)
 Inpatient Rehab Admissions Coordinator:   I have a bed available for pt to admit to CIR today.  Dr. Paola in agreement and [facility] aware.  I notified pt/spouse on the phone and they are in agreement to proceed with admit today.   1205: Due to change in bed availability, I will not be able to admit this patient today.  Will follow for admit once bed becomes available.    Reche Lowers, PT, DPT Admissions Coordinator [phone] 08/14/24 11:45 AM
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
## 2024-08-14 NOTE — Plan of Care (Signed)

## 2024-08-15 LAB — BASIC METABOLIC PANEL WITH GFR
Anion gap: 11 (ref 5–15)
BUN: 9 mg/dL (ref 6–20)
CO2: 25 mmol/L (ref 22–32)
Calcium: 8.4 mg/dL — ABNORMAL LOW (ref 8.9–10.3)
Chloride: 100 mmol/L (ref 98–111)
Creatinine, Ser: 0.54 mg/dL (ref 0.44–1.00)
GFR, Estimated: >60 mL/min
Glucose, Bld: 108 mg/dL — ABNORMAL HIGH (ref 70–99)
Potassium: 3.5 mmol/L (ref 3.5–5.1)
Sodium: 136 mmol/L (ref 135–145)

## 2024-08-15 LAB — CBC
HCT: 36.4 % (ref 36.0–46.0)
Hemoglobin: 11.9 g/dL — ABNORMAL LOW (ref 12.0–15.0)
MCH: 32.1 pg (ref 26.0–34.0)
MCHC: 32.7 g/dL (ref 30.0–36.0)
MCV: 98.1 fL (ref 80.0–100.0)
Platelets: 68 10*3/uL — ABNORMAL LOW (ref 150–400)
RBC: 3.71 MIL/uL — ABNORMAL LOW (ref 3.87–5.11)
RDW: 18.6 % — ABNORMAL HIGH (ref 11.5–15.5)
WBC: 9.2 10*3/uL (ref 4.0–10.5)
nRBC: 0 % (ref 0.0–0.2)
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
## 2024-08-16 LAB — CBC
HCT: 36.2 % (ref 36.0–46.0)
Hemoglobin: 11.5 g/dL — ABNORMAL LOW (ref 12.0–15.0)
MCH: 31.2 pg (ref 26.0–34.0)
MCHC: 31.8 g/dL (ref 30.0–36.0)
MCV: 98.1 fL (ref 80.0–100.0)
Platelets: UNDETERMINED 10*3/uL (ref 150–400)
RBC: 3.69 MIL/uL — ABNORMAL LOW (ref 3.87–5.11)
RDW: 18.5 % — ABNORMAL HIGH (ref 11.5–15.5)
WBC: 8.4 10*3/uL (ref 4.0–10.5)
nRBC: 0 % (ref 0.0–0.2)

## 2024-08-16 LAB — BASIC METABOLIC PANEL WITH GFR
Anion gap: 12 (ref 5–15)
BUN: 8 mg/dL (ref 6–20)
CO2: 24 mmol/L (ref 22–32)
Calcium: 8.3 mg/dL — ABNORMAL LOW (ref 8.9–10.3)
Chloride: 101 mmol/L (ref 98–111)
Creatinine, Ser: 0.53 mg/dL (ref 0.44–1.00)
GFR, Estimated: >60 mL/min
Glucose, Bld: 113 mg/dL — ABNORMAL HIGH (ref 70–99)
Potassium: 3.6 mmol/L (ref 3.5–5.1)
Sodium: 137 mmol/L (ref 135–145)

## 2024-08-16 NOTE — Plan of Care (Signed)
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
## 2024-08-17 ENCOUNTER — Encounter (HOSPITAL_COMMUNITY): Payer: Self-pay | Admitting: Physical Medicine and Rehabilitation

## 2024-08-17 ENCOUNTER — Inpatient Hospital Stay (HOSPITAL_COMMUNITY)
Admission: AD | Admit: 2024-08-17 | DRG: 560 | Source: Intra-hospital | Attending: Physical Medicine and Rehabilitation | Admitting: Physical Medicine and Rehabilitation

## 2024-08-17 ENCOUNTER — Encounter: Payer: Self-pay | Admitting: Oncology

## 2024-08-17 ENCOUNTER — Other Ambulatory Visit: Payer: Self-pay

## 2024-08-17 DIAGNOSIS — S069X9S Unspecified intracranial injury with loss of consciousness of unspecified duration, sequela: Secondary | ICD-10-CM

## 2024-08-17 DIAGNOSIS — T1490XA Injury, unspecified, initial encounter: Principal | ICD-10-CM | POA: Diagnosis present

## 2024-08-17 MED ORDER — SIMETHICONE 80 MG PO CHEW: 80.0000 mg | CHEWABLE_TABLET | Freq: Four times a day (QID) | ORAL | Status: AC | PRN

## 2024-08-17 MED ORDER — PANTOPRAZOLE SODIUM 40 MG PO TBEC
40.0000 mg | DELAYED_RELEASE_TABLET | Freq: Every day | ORAL | Status: AC
  Administered 2024-08-18 – 2024-08-21 (×4): 40 mg via ORAL
  Filled 2024-08-17 (×4): qty 1

## 2024-08-17 MED ORDER — FLEET ENEMA RE ENEM: 1.0000 | ENEMA | Freq: Once | RECTAL | Status: AC | PRN

## 2024-08-17 MED ORDER — HYDROXYZINE HCL 25 MG PO TABS
25.0000 mg | ORAL_TABLET | Freq: Two times a day (BID) | ORAL | Status: AC | PRN
  Filled 2024-08-17: qty 1

## 2024-08-17 MED ORDER — IPRATROPIUM BROMIDE 0.06 % NA SOLN
2.0000 | Freq: Two times a day (BID) | NASAL | Status: AC
  Administered 2024-08-19 – 2024-08-21 (×5): 2 via NASAL
  Filled 2024-08-17: qty 15

## 2024-08-17 MED ORDER — THIAMINE MONONITRATE 100 MG PO TABS
100.0000 mg | ORAL_TABLET | Freq: Every day | ORAL | Status: AC
  Administered 2024-08-18 – 2024-08-21 (×4): 100 mg via ORAL
  Filled 2024-08-17 (×4): qty 1

## 2024-08-17 MED ORDER — FOLIC ACID 1 MG PO TABS
1.0000 mg | ORAL_TABLET | Freq: Every day | ORAL | Status: AC
  Administered 2024-08-18 – 2024-08-21 (×4): 1 mg via ORAL
  Filled 2024-08-17 (×4): qty 1

## 2024-08-17 MED ORDER — ACETAMINOPHEN 325 MG PO TABS: 325.0000 mg | ORAL_TABLET | ORAL | Status: AC | PRN

## 2024-08-17 MED ORDER — OXYCODONE HCL 5 MG PO TABS
5.0000 mg | ORAL_TABLET | ORAL | Status: AC | PRN
  Administered 2024-08-17 – 2024-08-21 (×15): 10 mg via ORAL
  Filled 2024-08-17 (×12): qty 2
  Filled 2024-08-17: qty 1
  Filled 2024-08-17 (×4): qty 2

## 2024-08-17 MED ORDER — PROCHLORPERAZINE 25 MG RE SUPP: 12.5000 mg | Freq: Four times a day (QID) | RECTAL | Status: AC | PRN

## 2024-08-17 MED ORDER — WHITE PETROLATUM EX OINT: 1.0000 | TOPICAL_OINTMENT | CUTANEOUS | Status: AC | PRN

## 2024-08-17 MED ORDER — NICOTINE 14 MG/24HR TD PT24
14.0000 mg | MEDICATED_PATCH | Freq: Every day | TRANSDERMAL | Status: AC
  Administered 2024-08-18 – 2024-08-21 (×4): 14 mg via TRANSDERMAL
  Filled 2024-08-17 (×4): qty 1

## 2024-08-17 MED ORDER — GUAIFENESIN-DM 100-10 MG/5ML PO SYRP: 5.0000 mL | ORAL_SOLUTION | Freq: Four times a day (QID) | ORAL | Status: AC | PRN

## 2024-08-17 MED ORDER — PROCHLORPERAZINE MALEATE 5 MG PO TABS: 5.0000 mg | ORAL_TABLET | Freq: Four times a day (QID) | ORAL | Status: AC | PRN

## 2024-08-17 MED ORDER — ALBUTEROL SULFATE (2.5 MG/3ML) 0.083% IN NEBU: 2.5000 mg | INHALATION_SOLUTION | RESPIRATORY_TRACT | Status: AC | PRN

## 2024-08-17 MED ORDER — ESCITALOPRAM OXALATE 10 MG PO TABS
10.0000 mg | ORAL_TABLET | Freq: Every day | ORAL | Status: AC
  Administered 2024-08-18 – 2024-08-21 (×4): 10 mg via ORAL
  Filled 2024-08-17 (×4): qty 1

## 2024-08-17 MED ORDER — ARIPIPRAZOLE 5 MG PO TABS
5.0000 mg | ORAL_TABLET | Freq: Every day | ORAL | Status: AC
  Administered 2024-08-18 – 2024-08-21 (×4): 5 mg via ORAL
  Filled 2024-08-17 (×5): qty 1

## 2024-08-17 MED ORDER — DIPHENHYDRAMINE HCL 25 MG PO CAPS: 25.0000 mg | ORAL_CAPSULE | Freq: Four times a day (QID) | ORAL | Status: AC | PRN

## 2024-08-17 MED ORDER — METOPROLOL SUCCINATE ER 50 MG PO TB24
100.0000 mg | ORAL_TABLET | Freq: Every day | ORAL | Status: AC
  Administered 2024-08-18 – 2024-08-21 (×4): 100 mg via ORAL
  Filled 2024-08-17 (×4): qty 2

## 2024-08-17 MED ORDER — CALCIUM CITRATE 950 (200 CA) MG PO TABS
200.0000 mg | ORAL_TABLET | Freq: Every day | ORAL | Status: AC
  Administered 2024-08-18 – 2024-08-21 (×4): 950 mg via ORAL
  Filled 2024-08-17 (×4): qty 1

## 2024-08-17 MED ORDER — BOOST / RESOURCE BREEZE PO LIQD CUSTOM
1.0000 | Freq: Three times a day (TID) | ORAL | Status: AC
  Administered 2024-08-18: 1 via ORAL
  Administered 2024-08-19: 237 mL via ORAL
  Administered 2024-08-19 – 2024-08-21 (×7): 1 via ORAL

## 2024-08-17 MED ORDER — PROCHLORPERAZINE EDISYLATE 10 MG/2ML IJ SOLN: 5.0000 mg | Freq: Four times a day (QID) | INTRAMUSCULAR | Status: AC | PRN

## 2024-08-17 MED ORDER — TRAZODONE HCL 50 MG PO TABS
25.0000 mg | ORAL_TABLET | Freq: Every evening | ORAL | Status: AC | PRN
  Administered 2024-08-19 – 2024-08-21 (×3): 50 mg via ORAL
  Filled 2024-08-17 (×3): qty 1

## 2024-08-17 MED ORDER — BISACODYL 10 MG RE SUPP: 10.0000 mg | Freq: Every day | RECTAL | Status: AC | PRN

## 2024-08-17 MED ORDER — ORAL CARE MOUTH RINSE: 15.0000 mL | OROMUCOSAL | Status: AC | PRN

## 2024-08-17 MED ORDER — ADULT MULTIVITAMIN W/MINERALS CH
1.0000 | ORAL_TABLET | Freq: Every day | ORAL | Status: AC
  Administered 2024-08-18 – 2024-08-21 (×4): 1 via ORAL
  Filled 2024-08-17 (×4): qty 1

## 2024-08-17 MED ORDER — BUDESON-GLYCOPYRROL-FORMOTEROL 160-9-4.8 MCG/ACT IN AERO
2.0000 | INHALATION_SPRAY | Freq: Two times a day (BID) | RESPIRATORY_TRACT | Status: AC
  Administered 2024-08-17 – 2024-08-21 (×8): 2 via RESPIRATORY_TRACT

## 2024-08-17 MED ORDER — POLYETHYLENE GLYCOL 3350 17 G PO PACK: 17.0000 g | PACK | Freq: Every day | ORAL | Status: AC | PRN

## 2024-08-17 MED ORDER — ALUM & MAG HYDROXIDE-SIMETH 200-200-20 MG/5ML PO SUSP: 30.0000 mL | ORAL | Status: AC | PRN

## 2024-08-17 NOTE — Plan of Care (Signed)
" °  Problem: Consults Goal: RH GENERAL PATIENT EDUCATION Description: See Patient Education module for education specifics. Outcome: Progressing   Problem: RH BOWEL ELIMINATION Goal: RH STG MANAGE BOWEL WITH ASSISTANCE Description: STG Manage Bowel with mod I Assistance. Outcome: Progressing   Problem: RH BLADDER ELIMINATION Goal: RH STG MANAGE BLADDER WITH ASSISTANCE Description: STG Manage Bladder With mod I Assistance Outcome: Progressing   Problem: RH SKIN INTEGRITY Goal: RH STG SKIN FREE OF INFECTION/BREAKDOWN Description: Manage skin with mod I assistance Outcome: Progressing   Problem: RH SAFETY Goal: RH STG ADHERE TO SAFETY PRECAUTIONS W/ASSISTANCE/DEVICE Description: STG Adhere to Safety Precautions With mod I Assistance/Device. Outcome: Progressing   Problem: RH PAIN MANAGEMENT Goal: RH STG PAIN MANAGED AT OR BELOW PT'S PAIN GOAL Description: <4 w/ prns Outcome: Progressing   Problem: RH KNOWLEDGE DEFICIT GENERAL Goal: RH STG INCREASE KNOWLEDGE OF SELF CARE AFTER HOSPITALIZATION Description: Manage increase knowledge of self care after hospitalization with mod I assistance from spouse using educational materials provided Outcome: Progressing   "

## 2024-08-17 NOTE — Progress Notes (Signed)
 Inpatient Rehabilitation  Patient information reviewed and entered into eRehab system by [facility]. Karen Kays., CCC/SLP, PPS Coordinator.  Information including medical coding, functional ability and quality indicators will be reviewed and updated through discharge.

## 2024-08-18 ENCOUNTER — Inpatient Hospital Stay (HOSPITAL_COMMUNITY)

## 2024-08-18 DIAGNOSIS — T1490XA Injury, unspecified, initial encounter: Secondary | ICD-10-CM

## 2024-08-18 DIAGNOSIS — M623 Immobility syndrome (paraplegic): Secondary | ICD-10-CM

## 2024-08-18 LAB — CBC WITH DIFFERENTIAL/PLATELET
Abs Immature Granulocytes: 0.03 10*3/uL (ref 0.00–0.07)
Basophils Absolute: 0.1 10*3/uL (ref 0.0–0.1)
Basophils Relative: 1 %
Eosinophils Absolute: 0.3 10*3/uL (ref 0.0–0.5)
Eosinophils Relative: 4 %
HCT: 38.2 % (ref 36.0–46.0)
Hemoglobin: 12.3 g/dL (ref 12.0–15.0)
Immature Granulocytes: 0 %
Lymphocytes Relative: 24 %
Lymphs Abs: 1.8 10*3/uL (ref 0.7–4.0)
MCH: 31.7 pg (ref 26.0–34.0)
MCHC: 32.2 g/dL (ref 30.0–36.0)
MCV: 98.5 fL (ref 80.0–100.0)
Monocytes Absolute: 0.8 10*3/uL (ref 0.1–1.0)
Monocytes Relative: 10 %
Neutro Abs: 4.6 10*3/uL (ref 1.7–7.7)
Neutrophils Relative %: 61 %
Platelets: 93 10*3/uL — ABNORMAL LOW (ref 150–400)
RBC: 3.88 MIL/uL (ref 3.87–5.11)
RDW: 18.6 % — ABNORMAL HIGH (ref 11.5–15.5)
WBC: 7.5 10*3/uL (ref 4.0–10.5)
nRBC: 0 % (ref 0.0–0.2)

## 2024-08-18 LAB — COMPREHENSIVE METABOLIC PANEL WITH GFR
ALT: 47 U/L — ABNORMAL HIGH (ref 0–44)
AST: 98 U/L — ABNORMAL HIGH (ref 15–41)
Albumin: 3.4 g/dL — ABNORMAL LOW (ref 3.5–5.0)
Alkaline Phosphatase: 246 U/L — ABNORMAL HIGH (ref 38–126)
Anion gap: 8 (ref 5–15)
BUN: 7 mg/dL (ref 6–20)
CO2: 28 mmol/L (ref 22–32)
Calcium: 9.1 mg/dL (ref 8.9–10.3)
Chloride: 103 mmol/L (ref 98–111)
Creatinine, Ser: 0.56 mg/dL (ref 0.44–1.00)
GFR, Estimated: >60 mL/min
Glucose, Bld: 106 mg/dL — ABNORMAL HIGH (ref 70–99)
Potassium: 4.1 mmol/L (ref 3.5–5.1)
Sodium: 139 mmol/L (ref 135–145)
Total Bilirubin: 0.7 mg/dL (ref 0.0–1.2)
Total Protein: 6.6 g/dL (ref 6.5–8.1)

## 2024-08-18 LAB — MAGNESIUM: Magnesium: 1.6 mg/dL — ABNORMAL LOW (ref 1.7–2.4)

## 2024-08-18 LAB — HEMOGLOBIN A1C
Hgb A1c MFr Bld: 5.2 % (ref 4.8–5.6)
Mean Plasma Glucose: 102.54 mg/dL

## 2024-08-18 LAB — PROTIME-INR
INR: 1.1 (ref 0.8–1.2)
Prothrombin Time: 14.5 s (ref 11.4–15.2)

## 2024-08-18 LAB — HEPARIN LEVEL (UNFRACTIONATED): Heparin Unfractionated: 0.15 [IU]/mL — ABNORMAL LOW (ref 0.30–0.70)

## 2024-08-18 MED ORDER — MAGNESIUM CHLORIDE 64 MG PO TBEC
1.0000 | DELAYED_RELEASE_TABLET | Freq: Two times a day (BID) | ORAL | Status: AC
  Administered 2024-08-18 – 2024-08-21 (×8): 64 mg via ORAL
  Filled 2024-08-18 (×9): qty 1

## 2024-08-18 MED ORDER — MAGNESIUM SULFATE IN D5W 1-5 GM/100ML-% IV SOLN
1.0000 g | Freq: Once | INTRAVENOUS | Status: AC
  Administered 2024-08-18: 1 g via INTRAVENOUS
  Filled 2024-08-18: qty 100

## 2024-08-18 MED ORDER — HEPARIN (PORCINE) 25000 UT/250ML-% IV SOLN
1650.0000 [IU]/h | INTRAVENOUS | Status: DC
  Administered 2024-08-18: 1250 [IU]/h via INTRAVENOUS
  Administered 2024-08-19: 1450 [IU]/h via INTRAVENOUS
  Administered 2024-08-20: 1550 [IU]/h via INTRAVENOUS
  Filled 2024-08-18 (×3): qty 250

## 2024-08-18 NOTE — Progress Notes (Signed)
 Inpatient Rehabilitation Admission Medication Review by a Pharmacist  A complete drug regimen review was completed for this patient to identify any potential clinically significant medication issues.  High Risk Drug Classes Is patient taking? Indication by Medication  Antipsychotic Yes Abilify  - mood  Compazine - n/v   Anticoagulant No   Antibiotic No   Opioid Yes Oxycodone - acute pain   Antiplatelet No   Hypoglycemics/insulin No   Vasoactive Medication Yes Toprol  - TACHCARDIA   Chemotherapy No   Other Yes fleet enema , PEG , bisacodyl  , and - constipation Maalox- indigestion Pantoprazole - reflux  Diphenhydramine - itching  hydroxyzine - itching/anxiety  Robitussin- cough  albuterol - wheezing/SOB simethicone - gas   trazodone  and -insomnia FA, MVI, calcium  citrate- supplement  Lexapro - BPD   Breztri - asthma/COPD  Nicotine  patch- tobacco dependence      Type of Medication Issue Identified Description of Issue Recommendation(s)  Drug Interaction(s) (clinically significant)     Duplicate Therapy     Allergy     No Medication Administration End Date     Incorrect Dose     Additional Drug Therapy Needed     Significant med changes from prior encounter (inform family/care partners about these prior to discharge). PTA meds not resumed- lamotrigine  (on abilify )    Medications not resumed from acute care setting- robaxin   Communicate relevant medication changes to patient/family members at discharge from CIR.   Restart or discontinue PTA meds not resumed in CIR at discharge if clinically indicated.   Other       Clinically significant medication issues were identified that warrant physician communication and completion of prescribed/recommended actions by midnight of the next day:  No  Name of provider notified for urgent issues identified:   Provider Method of Notification:   Pharmacist comments:   Time spent performing this drug regimen review (minutes):   20   Massie Fila, PharmD Clinical Pharmacist  08/18/2024 8:49 AM
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
## 2024-08-18 NOTE — Plan of Care (Signed)
" °  Problem: RH Balance Goal: LTG Patient will maintain dynamic standing with ADLs (OT) Description: LTG:  Patient will maintain dynamic standing balance with assist during activities of daily living (OT)  Flowsheets (Taken 08/18/2024 1457) LTG: Pt will maintain dynamic standing balance during ADLs with: Independent with assistive device   Problem: RH Bathing Goal: LTG Patient will bathe all body parts with assist levels (OT) Description: LTG: Patient will bathe all body parts with assist levels (OT) Flowsheets (Taken 08/18/2024 1457) LTG: Pt will perform bathing with assistance level/cueing: Independent with assistive device    Problem: RH Dressing Goal: LTG Patient will perform upper body dressing (OT) Description: LTG Patient will perform upper body dressing with assist, with/without cues (OT). Flowsheets (Taken 08/18/2024 1457) LTG: Pt will perform upper body dressing with assistance level of: Independent with assistive device Goal: LTG Patient will perform lower body dressing w/assist (OT) Description: LTG: Patient will perform lower body dressing with assist, with/without cues in positioning using equipment (OT) Flowsheets (Taken 08/18/2024 1457) LTG: Pt will perform lower body dressing with assistance level of: Independent with assistive device   Problem: RH Toileting Goal: LTG Patient will perform toileting task (3/3 steps) with assistance level (OT) Description: LTG: Patient will perform toileting task (3/3 steps) with assistance level (OT)  Flowsheets (Taken 08/18/2024 1457) LTG: Pt will perform toileting task (3/3 steps) with assistance level: Independent with assistive device   Problem: RH Simple Meal Prep Goal: LTG Patient will perform simple meal prep w/assist (OT) Description: LTG: Patient will perform simple meal prep with assistance, with/without cues (OT). Flowsheets (Taken 08/18/2024 1457) LTG: Pt will perform simple meal prep with assistance level of: Independent with assistive  device   Problem: RH Light Housekeeping Goal: LTG Patient will perform light housekeeping w/assist (OT) Description: LTG: Patient will perform light housekeeping with assistance, with/without cues (OT). Flowsheets (Taken 08/18/2024 1457) LTG: Pt will perform light housekeeping with assistance level of: Independent with assistive device   Problem: RH Toilet Transfers Goal: LTG Patient will perform toilet transfers w/assist (OT) Description: LTG: Patient will perform toilet transfers with assist, with/without cues using equipment (OT) Flowsheets (Taken 08/18/2024 1457) LTG: Pt will perform toilet transfers with assistance level of: Independent with assistive device   Problem: RH Tub/Shower Transfers Goal: LTG Patient will perform tub/shower transfers w/assist (OT) Description: LTG: Patient will perform tub/shower transfers with assist, with/without cues using equipment (OT) Flowsheets (Taken 08/18/2024 1457) LTG: Pt will perform tub/shower stall transfers with assistance level of: Independent with assistive device   Problem: RH Memory Goal: LTG Patient will demonstrate ability for day to day recall/carry over during activities of daily living with assistance level (OT) Description: LTG:  Patient will demonstrate ability for day to day recall/carry over during activities of daily living with assistance level (OT). Flowsheets (Taken 08/18/2024 1457) LTG:  Patient will demonstrate ability for day to day recall/carry over during activities of daily living with assistance level (OT): Modified Independent   "
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
## 2024-08-18 NOTE — Progress Notes (Signed)
 Met with patient to review current situation, team conference and plan of care. Reviewed medications, pain, use of collar, bowel bladder and sleep. Reviewed smoking cessation, alcohol cessation and being more active. Continue to follow along to provide educational needs to facilitate preparation for discharge.

## 2024-08-18 NOTE — Plan of Care (Signed)
" °  Problem: Consults Goal: RH GENERAL PATIENT EDUCATION Description: See Patient Education module for education specifics. Outcome: Progressing   Problem: RH BOWEL ELIMINATION Goal: RH STG MANAGE BOWEL WITH ASSISTANCE Description: STG Manage Bowel with mod I Assistance. Outcome: Progressing   Problem: RH BLADDER ELIMINATION Goal: RH STG MANAGE BLADDER WITH ASSISTANCE Description: STG Manage Bladder With mod I Assistance Outcome: Progressing   Problem: RH SKIN INTEGRITY Goal: RH STG SKIN FREE OF INFECTION/BREAKDOWN Description: Manage skin with mod I assistance Outcome: Progressing   Problem: RH SAFETY Goal: RH STG ADHERE TO SAFETY PRECAUTIONS W/ASSISTANCE/DEVICE Description: STG Adhere to Safety Precautions With mod I Assistance/Device. Outcome: Progressing   Problem: RH PAIN MANAGEMENT Goal: RH STG PAIN MANAGED AT OR BELOW PT'S PAIN GOAL Description: <4 w/ prns Outcome: Progressing   Problem: RH KNOWLEDGE DEFICIT GENERAL Goal: RH STG INCREASE KNOWLEDGE OF SELF CARE AFTER HOSPITALIZATION Description: Manage increase knowledge of self care after hospitalization with mod I assistance from spouse using educational materials provided Outcome: Progressing   Problem: Consults Goal: RH BRAIN INJURY PATIENT EDUCATION Description: Description: See Patient Education module for eduction specifics Outcome: Progressing   Problem: RH KNOWLEDGE DEFICIT BRAIN INJURY Goal: RH STG INCREASE KNOWLEDGE OF SELF CARE AFTER BRAIN INJURY Description: Increase knowledge of self care after brain injury with mod I assistance from husband using educational materials provided Outcome: Progressing   "

## 2024-08-19 ENCOUNTER — Other Ambulatory Visit (HOSPITAL_COMMUNITY): Payer: Self-pay

## 2024-08-19 LAB — HEPARIN LEVEL (UNFRACTIONATED)
Heparin Unfractionated: 0.21 [IU]/mL — ABNORMAL LOW (ref 0.30–0.70)
Heparin Unfractionated: 0.22 [IU]/mL — ABNORMAL LOW (ref 0.30–0.70)

## 2024-08-19 LAB — CBC WITH DIFFERENTIAL/PLATELET
Abs Immature Granulocytes: 0.01 10*3/uL (ref 0.00–0.07)
Basophils Absolute: 0.1 10*3/uL (ref 0.0–0.1)
Basophils Relative: 1 %
Eosinophils Absolute: 0.4 10*3/uL (ref 0.0–0.5)
Eosinophils Relative: 4 %
HCT: 36.7 % (ref 36.0–46.0)
Hemoglobin: 12 g/dL (ref 12.0–15.0)
Immature Granulocytes: 0 %
Lymphocytes Relative: 27 %
Lymphs Abs: 2.1 10*3/uL (ref 0.7–4.0)
MCH: 32.9 pg (ref 26.0–34.0)
MCHC: 32.7 g/dL (ref 30.0–36.0)
MCV: 100.5 fL — ABNORMAL HIGH (ref 80.0–100.0)
Monocytes Absolute: 0.9 10*3/uL (ref 0.1–1.0)
Monocytes Relative: 11 %
Neutro Abs: 4.6 10*3/uL (ref 1.7–7.7)
Neutrophils Relative %: 57 %
Platelets: 88 10*3/uL — ABNORMAL LOW (ref 150–400)
RBC: 3.65 MIL/uL — ABNORMAL LOW (ref 3.87–5.11)
RDW: 18.5 % — ABNORMAL HIGH (ref 11.5–15.5)
WBC: 8 10*3/uL (ref 4.0–10.5)
nRBC: 0 % (ref 0.0–0.2)

## 2024-08-19 LAB — MAGNESIUM: Magnesium: 1.6 mg/dL — ABNORMAL LOW (ref 1.7–2.4)

## 2024-08-19 MED ORDER — MAGNESIUM SULFATE 4 GM/100ML IV SOLN
4.0000 g | Freq: Once | INTRAVENOUS | Status: AC
  Administered 2024-08-19: 4 g via INTRAVENOUS
  Filled 2024-08-19: qty 100

## 2024-08-19 NOTE — Care Management (Signed)
 [facility]patient [facility] Individual Statement of Services  Patient Name:  Carly [name]  Date:  08/19/2024  Welcome to the [facility]patient [facility].  Our goal is to provide you with an individualized program based on your diagnosis and situation, designed to meet your specific needs.  With this comprehensive rehabilitation program, you will be expected to participate in at least 3 hours of rehabilitation therapies Monday-Friday, with modified therapy programming on the weekends.  Your rehabilitation program will include the following services:  Physical Therapy (PT), Occupational Therapy (OT), 24 hour per day rehabilitation nursing, Therapeutic Recreaction (TR), Psychology, Neuropsychology, Care Coordinator, Rehabilitation Medicine, Nutrition Services, Pharmacy Services, and Other  Weekly team conferences will be held on Tuesday to discuss your progress.  Your Inpatient Rehabilitation Care Coordinator will talk with you frequently to get your input and to update you on team discussions.  Team conferences with you and your family in attendance may also be held.  Expected length of stay: 7-10 days  Overall anticipated outcome: Independent with an Assistive Device  Depending on your progress and recovery, your program may change. Your Inpatient Rehabilitation Care Coordinator will coordinate services and will keep you informed of any changes. Your Inpatient Rehabilitation Care Coordinator's name and contact numbers are listed  below.  The following services may also be recommended but are not provided by the Inpatient Rehabilitation Center:  Driving Evaluations Home Health Rehabiltiation Services Outpatient Rehabilitation Services Vocational Rehabilitation   Arrangements will be made to provide these services after discharge if needed.  Arrangements include referral to agencies that provide these services.  Your insurance has been verified to be:  Uninsured  Your primary doctor  is:  Kalpana Bair  Pertinent information will be shared with your doctor and your insurance company.  Inpatient Rehabilitation Care Coordinator:  Graeme Jude, KEN [phone] or ([phone]  Information discussed with and copy given to patient by: Graeme DELENA Jude, 08/19/2024, 10:52 AM

## 2024-08-19 NOTE — Progress Notes (Signed)
 PHARMACY - ANTICOAGULATION CONSULT NOTE  Pharmacy Consult for heparin  Indication: chronic R femoral vein DVT  Allergies[1]  Patient Measurements: Height: 5' 3 (160 cm) Weight: 109.8 kg (242 lb 1 oz) IBW/kg (Calculated) : 52.4 HEPARIN  DW (KG): 78.8  Vital Signs: Temp: 97.7 F (36.5 C) (02/04 1935) Temp Source: Oral (02/04 1935) BP: 120/77 (02/04 1935) Pulse Rate: 77 (02/04 1935)  Labs: Recent Labs    08/18/24 0557 08/18/24 2103 08/19/24 0511 08/19/24 1855  HGB 12.3  --  12.0  --   HCT 38.2  --  36.7  --   PLT 93*  --  88*  --   LABPROT 14.5  --   --   --   INR 1.1  --   --   --   HEPARINUNFRC  --  0.15* 0.21* 0.22*  CREATININE 0.56  --   --   --     Estimated Creatinine Clearance: 96.8 mL/min (by C-G formula based on SCr of 0.56 mg/dL).   Medical History: Past Medical History:  Diagnosis Date   Abnormal uterine bleeding (AUB) 08/26/2018   Alcohol abuse 01/11/2024   Alcohol-induced mood disorder with depressive symptoms (HCC) 01/11/2024   Anxiety    C. difficile diarrhea    07/04/21   Chicken pox    Depression    Depression, major, single episode, moderate (HCC) 08/04/2018   Diarrhea 06/07/2022   Dyspnea on exertion 05/24/2022   Elevated testosterone  level in female    Emphysema of lung (HCC)    bronchitis   Frequent headaches    Generalized anxiety disorder 08/23/2014   Hay fever    Hypertension    Idiopathic thrombocytopenic purpura (ITP) (HCC)    Iron deficiency 09/02/2017   Positive ANA (antinuclear antibody)    Tachycardia 09/09/2022   Thrombocytopenia 02/12/2016    Medications:  Medications Prior to Admission  Medication Sig Dispense Refill Last Dose/Taking   albuterol  (PROVENTIL ) (2.5 MG/3ML) 0.083% nebulizer solution Take 3 mLs (2.5 mg total) by nebulization every 4 (four) hours as needed for wheezing or shortness of breath. 150 mL 5    albuterol  (VENTOLIN  HFA) 108 (90 Base) MCG/ACT inhaler Inhale 2 puffs into the lungs every 4 (four)  hours as needed for wheezing or shortness of breath. 6.7 each 3    ARIPiprazole  (ABILIFY ) 5 MG tablet Take 1 tablet (5 mg total) by mouth daily. 30 tablet 0    benzonatate  (TESSALON ) 100 MG capsule Take 1 capsule (100 mg total) by mouth 3 (three) times daily as needed for cough. 30 capsule 0    BREZTRI  AEROSPHERE 160-9-4.8 MCG/ACT AERO inhaler INHALE 2 PUFFS INTO THE LUNGS IN THE MORNING AND AT BEDTIME. 10.7 each 5    escitalopram  (LEXAPRO ) 10 MG tablet Take 10 mg by mouth daily.      hydrOXYzine  (ATARAX ) 25 MG tablet Take 1 tablet (25 mg total) by mouth 2 (two) times daily as needed for anxiety. 60 tablet 0    hydrOXYzine  (ATARAX ) 25 MG tablet Take 25 mg by mouth at bedtime as needed for anxiety.      ipratropium (ATROVENT ) 0.03 % nasal spray Place 2 sprays into both nostrils every 12 (twelve) hours. 30 mL 0    lamoTRIgine  (LAMICTAL ) 100 MG tablet Take 100 mg by mouth daily.      metoprolol  succinate (TOPROL -XL) 100 MG 24 hr tablet Take 1 tablet (100 mg total) by mouth daily. TAKE WITH OR IMMEDIATELY FOLLOWING A MEAL. 90 tablet 3    omeprazole  (PRILOSEC) 20  MG capsule Take 1 capsule (20 mg total) by mouth daily. 90 capsule 3    promethazine -dextromethorphan  (PROMETHAZINE -DM) 6.25-15 MG/5ML syrup Take 2.5 mLs by mouth at bedtime as needed for cough. 118 mL 0    Scheduled:   ARIPiprazole   5 mg Oral Daily   budesonide -glycopyrrolate -formoterol   2 puff Inhalation BID   calcium  citrate  200 mg of elemental calcium  Oral Q lunch   escitalopram   10 mg Oral Daily   feeding supplement  1 Container Oral TID BM   folic acid   1 mg Oral Daily   ipratropium  2 spray Each Nare Q12H   magnesium  chloride  1 tablet Oral BID   metoprolol  succinate  100 mg Oral Daily   multivitamin with minerals  1 tablet Oral Daily   nicotine   14 mg Transdermal Daily   pantoprazole   40 mg Oral Daily   thiamine   100 mg Oral Daily    Assessment: Patient is a 54yoF admitted to CIR today post SAH, SDH, and small mesenteric  contusion at mesenteric vein on 08/08/24. Neurology reports repeat CT head on 08/09/24 was stable. PMH significant for immune thrombocytopenic purpura (ITP). No anticoagulation PTA. Pharmacy was originally consulted for enoxaparin  but requested to switch to heparin  with patient at high risk for bleed. Platelets are improved today at 93, Hgb WNL at 12.3. Due to bleed risk factors, will not bolus and set a lower goal.   Heparin  level 0.22 is subtherapeutic on 1450 units/hr. hgb stable. Plt 88.  No issues with infusion or bleeding per RN.  Goal of Therapy:  Heparin  level 0.3-0.5 units/ml Monitor platelets by anticoagulation protocol: Yes   Plan:  -Increase heparin  infusion to 1550 units/hr -Monitor Hgb, PLT and signs/symptoms of bleeding - F/U long term AC plan (per onc)   Jinnie Door, PharmD, BCPS, BCCP Clinical Pharmacist  Please check AMION for all [facility] Pharmacy phone numbers After 10:00 PM, call Main Pharmacy [phone]       [1]  Allergies Allergen Reactions   Wellbutrin  [Bupropion ] Anxiety    Crazy    Meloxicam  Nausea Only

## 2024-08-19 NOTE — Progress Notes (Signed)
 PHARMACY - ANTICOAGULATION CONSULT NOTE  Pharmacy Consult for heparin  Indication: chronic R femoral vein DVT  Allergies[1]  Patient Measurements: Height: 5' 3 (160 cm) Weight: 109.8 kg (242 lb 1 oz) IBW/kg (Calculated) : 52.4 HEPARIN  DW (KG): 78.8  Vital Signs: Temp: 97.7 F (36.5 C) (02/04 0327) Temp Source: Oral (02/04 0327) BP: 111/67 (02/04 0327) Pulse Rate: 78 (02/04 0327)  Labs: Recent Labs    08/18/24 0557 08/18/24 2103 08/19/24 0511  HGB 12.3  --   --   HCT 38.2  --   --   PLT 93*  --   --   LABPROT 14.5  --   --   INR 1.1  --   --   HEPARINUNFRC  --  0.15* 0.21*  CREATININE 0.56  --   --     Estimated Creatinine Clearance: 96.8 mL/min (by C-G formula based on SCr of 0.56 mg/dL).   Medical History: Past Medical History:  Diagnosis Date   Abnormal uterine bleeding (AUB) 08/26/2018   Alcohol abuse 01/11/2024   Alcohol-induced mood disorder with depressive symptoms (HCC) 01/11/2024   Anxiety    C. difficile diarrhea    07/04/21   Chicken pox    Depression    Depression, major, single episode, moderate (HCC) 08/04/2018   Diarrhea 06/07/2022   Dyspnea on exertion 05/24/2022   Elevated testosterone  level in female    Emphysema of lung (HCC)    bronchitis   Frequent headaches    Generalized anxiety disorder 08/23/2014   Hay fever    Hypertension    Idiopathic thrombocytopenic purpura (ITP) (HCC)    Iron deficiency 09/02/2017   Positive ANA (antinuclear antibody)    Tachycardia 09/09/2022   Thrombocytopenia 02/12/2016    Medications:  Medications Prior to Admission  Medication Sig Dispense Refill Last Dose/Taking   albuterol  (PROVENTIL ) (2.5 MG/3ML) 0.083% nebulizer solution Take 3 mLs (2.5 mg total) by nebulization every 4 (four) hours as needed for wheezing or shortness of breath. 150 mL 5    albuterol  (VENTOLIN  HFA) 108 (90 Base) MCG/ACT inhaler Inhale 2 puffs into the lungs every 4 (four) hours as needed for wheezing or shortness of breath.  6.7 each 3    ARIPiprazole  (ABILIFY ) 5 MG tablet Take 1 tablet (5 mg total) by mouth daily. 30 tablet 0    benzonatate  (TESSALON ) 100 MG capsule Take 1 capsule (100 mg total) by mouth 3 (three) times daily as needed for cough. 30 capsule 0    BREZTRI  AEROSPHERE 160-9-4.8 MCG/ACT AERO inhaler INHALE 2 PUFFS INTO THE LUNGS IN THE MORNING AND AT BEDTIME. 10.7 each 5    escitalopram  (LEXAPRO ) 10 MG tablet Take 10 mg by mouth daily.      hydrOXYzine  (ATARAX ) 25 MG tablet Take 1 tablet (25 mg total) by mouth 2 (two) times daily as needed for anxiety. 60 tablet 0    hydrOXYzine  (ATARAX ) 25 MG tablet Take 25 mg by mouth at bedtime as needed for anxiety.      ipratropium (ATROVENT ) 0.03 % nasal spray Place 2 sprays into both nostrils every 12 (twelve) hours. 30 mL 0    lamoTRIgine  (LAMICTAL ) 100 MG tablet Take 100 mg by mouth daily.      metoprolol  succinate (TOPROL -XL) 100 MG 24 hr tablet Take 1 tablet (100 mg total) by mouth daily. TAKE WITH OR IMMEDIATELY FOLLOWING A MEAL. 90 tablet 3    omeprazole  (PRILOSEC) 20 MG capsule Take 1 capsule (20 mg total) by mouth daily. 90 capsule 3  promethazine -dextromethorphan  (PROMETHAZINE -DM) 6.25-15 MG/5ML syrup Take 2.5 mLs by mouth at bedtime as needed for cough. 118 mL 0    Scheduled:   ARIPiprazole   5 mg Oral Daily   budesonide -glycopyrrolate -formoterol   2 puff Inhalation BID   calcium  citrate  200 mg of elemental calcium  Oral Q lunch   escitalopram   10 mg Oral Daily   feeding supplement  1 Container Oral TID BM   folic acid   1 mg Oral Daily   ipratropium  2 spray Each Nare Q12H   magnesium  chloride  1 tablet Oral BID   metoprolol  succinate  100 mg Oral Daily   multivitamin with minerals  1 tablet Oral Daily   nicotine   14 mg Transdermal Daily   pantoprazole   40 mg Oral Daily   thiamine   100 mg Oral Daily    Assessment: Patient is a 54yoF admitted to CIR today post SAH, SDH, and small mesenteric contusion at mesenteric vein on 08/08/24. Neurology  reports repeat CT head on 08/09/24 was stable. PMH significant for immune thrombocytopenic purpura (ITP). No anticoagulation PTA. Pharmacy was originally consulted for enoxaparin  but requested to switch to heparin  with patient at high risk for bleed. Platelets are improved today at 93, Hgb WNL at 12.3. Due to bleed risk factors, will not bolus and set a lower goal.   2/4: HL below goal at 0.21 with heparin  running at 1350 U/h. Hgb remains WNL, PLT stable ~90. No signs of bleeding or issues with the heparin  infusion noted.   Goal of Therapy:  Heparin  level 0.3-0.5 units/ml Monitor platelets by anticoagulation protocol: Yes   Plan:  -Increase heparin  infusion to 1450 units/hr -Monitor 6-hr heparin  level -Monitor Hgb, PLT and signs/symptoms of bleeding - F/U long term AC plan (per onc)   Massie Fila, PharmD Clinical Pharmacist  08/19/2024 8:52 AM          [1]  Allergies Allergen Reactions   Wellbutrin  [Bupropion ] Anxiety    Crazy    Meloxicam  Nausea Only

## 2024-08-19 NOTE — Progress Notes (Signed)
 Physical Therapy Session Note  Patient Details  Name: Carly [name] MRN: [id number] Date of Birth: [birth year]  Today's Date: 08/19/2024 PT Individual Time: 1453-1533 PT Individual Time Calculation (min): 40 min   Short Term Goals: Week 1:  PT Short Term Goal 1 (Week 1): = LTGS  Skilled Therapeutic Interventions/Progress Updates: Pt presented sitting EOB with husband present agreeable to therapy. Pt c/o 6/10 pain in groin area, nsg notified and pain meds received during session. Session focused on functional mobility and general conditioning. Pt completed Sit to stand x 5 without AD from EOB for increased BLE ms recruitment. Pt ambulated ~9ft with RW and CGA with pt limiting distance due to pain and c/o that felt that RLE was going to buckle. Pt transported remaining distance to ortho gym. In gym participated in the follow therex LAQ 5lb weight 2 x 10 Hip abd/add 5lb 2 x 10 Hip ER with green theraband 2 x10 Hamstring pulls with green theraband 2 x 10 Pt then participated in standing tolerance/balance activity playing horseshoes with minimal reliance on RW with pt using RUE high/low and crossing midline. Pt transported back to room at end of session and completed ambulatory transfer to EOB with RW and CGA. Pt left seated EOB with bed alarm on, call bell within reach and husband present.      Therapy Documentation Precautions:  Precautions Precautions: Fall, Cervical, Back Recall of Precautions/Restrictions: Intact Precaution/Restrictions Comments: Verbally reviewed back precations. Required Braces or Orthoses: Cervical Brace Cervical Brace: At all times Restrictions Weight Bearing Restrictions Per Provider Order: Yes RLE Weight Bearing Per Provider Order: Weight bearing as tolerated LLE Weight Bearing Per Provider Order: Weight bearing as tolerated  Therapy/Group: Individual Therapy  Erle Guster 08/19/2024, 4:01 PM
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
## 2024-08-19 NOTE — Progress Notes (Signed)
 Occupational Therapy Session Note  Patient Details  Name: LOETTA CONNELLEY MRN: [id number] Date of Birth: May 11, 1971  Today's Date: 08/19/2024 OT Individual Time: 1350-1445 OT Individual Time Calculation (min): 55 min   Short Term Goals: Week 1:  OT Short Term Goal 1 (Week 1): STGs=LTGs due to patient's ELOS.  Skilled Therapeutic Interventions/Progress Updates:  Pt greeted sitting EOB for skilled OT session with focus on functional mobility/transfers, general conditioning, and higher-level standing balance for decreasing fall risk. Pt reports 5/10 pain in R-hip, rest/positioning provided for relief. Pt performs sit<>stands transfers during session with supervision, household level ambulation with CGA + RW. In ortho gym, pt instructed in [facility] on [place] modality for general conditioning/activity tolerance challenge. Pt tolerates ~10 mins of activity, resistance ranging from level 3-6 with no rest break. VSS. Pt then instructed in dynamic standing balance activity with use of airex foam and BITs modality. Pt able to balance with supervision + cues to alternate reach as needed. CGA provided for stepping onto/off of pad. Pt stands for rounds of ~1 min, progressing to standing balance without BUE support. Session ended with 2 x 1 mins of modified volleyball, tossing ball with 2# dowel bar, all for BUE strengthening. Pt remained sitting EOB, spouse present, bed alarm activated.   Therapy Documentation Precautions:  Precautions Precautions: Fall, Cervical, Back Recall of Precautions/Restrictions: Intact Precaution/Restrictions Comments: Verbally reviewed back precations. Required Braces or Orthoses: Cervical Brace Cervical Brace: At all times Restrictions Weight Bearing Restrictions Per Provider Order: No RLE Weight Bearing Per Provider Order: Weight bearing as tolerated LLE Weight Bearing Per Provider Order: Weight bearing as tolerated   Therapy/Group: Individual  Therapy  Nereida Habermann, OTR/L, MSOT  08/19/2024, 7:56 AM

## 2024-08-19 NOTE — Progress Notes (Signed)
 Recreational Therapy Session Note  Patient Details  Name: BHAKTI LABELLA MRN: [id number] Date of Birth: 06-Aug-1970 Today's Date: 08/19/2024  Pain: no c/o  Pt participated in animal assisted activity bed level with supervision, family present.  Pt easily engaged with pet partner team and was appreciative of this visit.   Keona Bilyeu 08/19/2024, 4:04 PM

## 2024-08-19 NOTE — Progress Notes (Signed)
 Patient ID: KEIMANI LAUFER, female   DOB: Dec 24, 1970, 54 y.o.   MRN: [id number]  1054- SW called pt husband to introduce self,explain role, discuss discharge process and inform on short ELOS. SW scheduled fam edu on Monday 9am-11am with the husband. SW discussed pt being uninsured. He states his job closed/laid off and lost his insurance. States his wife was under his insurance, so she is now trying to get enrolled under her insurance. SW will follow-up with pt to discuss further since husband was not sure on the status.    Graeme Jude, MSW, LCSW Office: [phone] Cell: [phone] Fax: [phone]

## 2024-08-19 NOTE — Progress Notes (Signed)
 Occupational Therapy Session Note  Patient Details  Name: Carly [name] MRN: [id number] Date of Birth: 11-19-1970  Today's Date: 08/19/2024 OT Individual Time: 1015-1100 OT Individual Time Calculation (min): 45 min    Short Term Goals: Week 1:  OT Short Term Goal 1 (Week 1): STGs=LTGs due to patient's ELOS.  Skilled Therapeutic Interventions/Progress Updates:    1:1 Pt received sitting on the EOB . Pt ambulated ot the sink with RW with contact guard to supervision and participated in bathing and changing paper scrubs at the sink. Pt able to perform UB bathing and dressing with setup and lower body peri area only in standing with steading A. Pt reported that her chin/ neck was bothering her from c collar and she usually has to shave but hasn't. Assisted pt with shaving chin neck to assist with comfort and pt with spot on chin wear c collar has rubbed her. Pt then ambulated to the bathroom with contact guard but forgot to look brakes proper to getting up-  requiring safety cues. Pt performed toileting with supervision. Ambulated to the bed and got into supine with supervision and c collar front doff and finished shaving and Bandage placed on chin. Began instruction/ Pt verbalizing how to doff / donn/ change pads of c collar safely in supine with helper- will need to continue to teach husband - on the phone at the time.   Pt able to perform bed mobility from flat bed to EOB mod I wihtout bed rail to pet Dixie, therapy dog. Pt left with husband at bedside with RT.    Therapy Documentation Precautions:  Precautions Precautions: Fall, Cervical, Back Recall of Precautions/Restrictions: Intact Precaution/Restrictions Comments: Verbally reviewed back precations. Required Braces or Orthoses: Cervical Brace Cervical Brace: At all times Restrictions Weight Bearing Restrictions Per Provider Order: Yes RLE Weight Bearing Per Provider Order: Weight bearing as tolerated LLE Weight Bearing Per Provider  Order: Weight bearing as tolerated  Pain: No pain other than regular pain in her pelvis    Therapy/Group: Individual Therapy   [name] 08/19/2024, 4:15 PM

## 2024-08-19 NOTE — Progress Notes (Signed)
 Physical Therapy Session Note  Patient Details  Name: Carly [name] MRN: [id number] Date of Birth: July 24, 1970  Today's Date: 08/19/2024 PT Individual Time: 0920-1015 PT Individual Time Calculation (min): 55 min   Short Term Goals: Week 1:  PT Short Term Goal 1 (Week 1): = LTGS  Skilled Therapeutic Interventions/Progress Updates:    Pt presents in room seated on toilet, agreeable to PT. Pt reporting pain in pelvis and low back, premedicated. Session foucsed on therapeutic activities to promote BLE strengthening, safety with self care tasks, and gait training with RW to promote upright tolerance and multidirectional stepping stability with RW. Pt completes sit to stand from [facility] over toilet with CGA to RW. Pt able to manage pulling pants over hips with BUEs, CGA for postural stability wihtout UE support. Pt ambulates slowly to sink with RW, able to complete hand hygiene in standing with supervision completed to promote improved standing tolerance without UE support. Pt comes back to EOB with CGA to sit, noted that pt had gotten pants dirty with pt able ot pull pants down over hips prior to sitting, requires mod assist to thread over feet. Pt provided with new pants, requires mod assist for threading over feet but able to stand and pull over hips with CGA for balance. Pt completes stand step transfer with RW with CGA to [facility] and transported to main gym dependently for time management and energy conservation. Pt completes sit to stand to RW, ambulates with RW with CGA/supervision slowly 95' with pt demonstrating short step length, increased UE weightbearing with RLE stance phase. Pt reports slight increase in pain in RLE with R weightbearing but subsides with rest. Pt completes forward/backward walking 3x10' with CGA, demonstrating good postural stability but completes slow gait speed with very small step length noted throughout. Pt takes seated rest break but then completes standing step taps x20 alternating  BLE as pregait activity to promote improved step height and weightbearing tolerance. Pt ambulates ~50' back towards room with CGA/close supervision with WC follow for fatigue, pt demonstrating RLE brief buckle requiring CGA for stability. Pt completes transported back to room via [facility] for energy conservation and time management. Pt completes stand step transfer back to [facility] with RW close supervision and remains seated EOB with all needs within reach, call light in place, pt husband at bedside to await follow up OT session at end of session.  Therapy Documentation Precautions:  Precautions Precautions: Fall, Cervical, Back Recall of Precautions/Restrictions: Intact Precaution/Restrictions Comments: Verbally reviewed back precations. Required Braces or Orthoses: Cervical Brace Cervical Brace: At all times Restrictions Weight Bearing Restrictions Per Provider Order: Yes RLE Weight Bearing Per Provider Order: Weight bearing as tolerated LLE Weight Bearing Per Provider Order: Weight bearing as tolerated    Therapy/Group: Individual Therapy  Reche Ohara PT, DPT 08/19/2024, 10:56 AM

## 2024-08-19 NOTE — Progress Notes (Signed)
 " Inpatient Rehabilitation Care Coordinator Assessment and Plan Patient Details  Name: Carly [name] MRN: [id number] Date of Birth: 1971/03/30  Today's Date: 08/19/2024  Hospital Problems: Principal Problem:   Trauma  Past Medical History:  Past Medical History:  Diagnosis Date   Abnormal uterine bleeding (AUB) 08/26/2018   Alcohol abuse 01/11/2024   Alcohol-induced mood disorder with depressive symptoms (HCC) 01/11/2024   Anxiety    C. difficile diarrhea    07/04/21   Chicken pox    Depression    Depression, major, single episode, moderate (HCC) 08/04/2018   Diarrhea 06/07/2022   Dyspnea on exertion 05/24/2022   Elevated testosterone  level in female    Emphysema of lung (HCC)    bronchitis   Frequent headaches    Generalized anxiety disorder 08/23/2014   Hay fever    Hypertension    Idiopathic thrombocytopenic purpura (ITP) (HCC)    Iron deficiency 09/02/2017   Positive ANA (antinuclear antibody)    Tachycardia 09/09/2022   Thrombocytopenia 02/12/2016   Past Surgical History:  Past Surgical History:  Procedure Laterality Date   HEMATOMA EVACUATION N/A 04/07/2020   Procedure: EVACUATION HEMATOMA  AND APPLICATION OF PRESSURE DRESSING;  Surgeon: Verdon Keen, MD;  Location: [facility] ORS;  Service: Gynecology;  Laterality: N/A;   HYSTEROSCOPY WITH NOVASURE N/A 02/21/2018   Procedure: HYSTEROSCOPY WITH NOVASURE, POLYPECTOMY;  Surgeon: Verdon Keen, MD;  Location: [facility] ORS;  Service: Gynecology;  Laterality: N/A;   TUBAL LIGATION  1997   TUBAL LIGATION     Social History:  reports that she has been smoking cigarettes. She has a 42 pack-year smoking history. She has never used smokeless tobacco. She reports that she does not currently use alcohol after a past usage of about 8.0 standard drinks of alcohol per week. She reports that she does not use drugs.  Family / Support Systems Marital Status: Married How Long?: 24 years Patient Roles: Spouse, Parent Spouse/Significant  Other: Wadie (husband) Children: Blended family. They have four children together.  Their youngest child is likely to assist. Other Supports: none Anticipated Caregiver: Husband Ability/Limitations of Caregiver: Pt husband is able to provide 24/7 care since he recently was laid off due to job relocating. Caregiver Availability: 24/7 Family Dynamics: Pt lives with her husband.  Social History Preferred language: English Religion: Baptist Cultural Background: Pt has been working as Marketing Executive: some Charity Fundraiser - How often do you need to have someone help you when you read instructions, pamphlets, or other written material from your doctor or pharmacy?: Never Writes: Yes Employment Status: Employed Return to Work Plans: Pt reports she has to wait one more day before she can apply for STD. States her son will pick up these forms and she will bring to the hospital. Legal History/Current Legal Issues: Denies Guardian/Conservator: Denies   Abuse/Neglect Abuse/Neglect Assessment Can Be Completed: Yes Physical Abuse: Denies Verbal Abuse: Denies Sexual Abuse: Denies Exploitation of patient/patient's resources: Denies Self-Neglect: Denies  Patient response to: Social Isolation - How often do you feel lonely or isolated from those around you?: Never  Emotional Status Pt's affect, behavior and adjustment status: Pt in good spirits at time of visit Recent Psychosocial Issues: Denies Psychiatric History: Pt admits to hx of depression and anxiety. has psychiatrist through online psych- talkiatry. Prescribed Lexapro , abilify , and hydroxyzine . No counseling hx. Substance Abuse History: Pt reports she quit smoking cigarettes since adnission. Has been smoking since 54 yrs old and was smoking 1ppd. States occassional etoh use. Denies rec  drg use.  Patient / Family Perceptions, Expectations & Goals Pt/Family understanding of illness & functional limitations: Pt and husband  have a genera understanding of care needs Premorbid pt/family roles/activities: Independent Anticipated changes in roles/activities/participation: Assistance with ADLs/IADLs Pt/family expectations/goals: Pt woud like to work on getting back to where she was and being as independent as possible where she does not need to rely on anyone.  Community Resources [employer]: None Premorbid Home Care/DME Agencies: None Transportation available at discharge: Husband Is the patient able to respond to transportation needs?: Yes In the past 12 months, has lack of transportation kept you from medical appointments or from getting medications?: No In the past 12 months, has lack of transportation kept you from meetings, work, or from getting things needed for daily living?: No Resource referrals recommended: Neuropsychology  Discharge Planning Living Arrangements: Spouse/significant other Support Systems: Spouse/significant other Type of Residence: Private residence Insurance Resources: Customer Service Manager Resources: Employment Surveyor, Quantity Screen Referred: No Living Expenses: Banker Management: Patient Does the patient have any problems obtaining your medications?: No Home Management: Pt husband now manages all home care needs Patient/Family Preliminary Plans: No changes Care Coordinator Barriers to Discharge: Decreased caregiver support, Lack of/limited family support, Community Education Officer for SNF coverage, Other (comments) Care Coordinator Barriers to Discharge Comments: Pt is uninsured Care Coordinator Anticipated Follow Up Needs: HH/OP Expected length of stay: ELOS 7-10 days  Clinical Impression SW met with pt in room at bedside with husband present. Pt is not a cytogeneticist. No HCPOA. No DME. SW discussed insurance status and she states she has to get on [employer] from [employer] first before she can enroll onto her insurance plan. She also asks if she needs a RW. SW shard unsure until  therapy makes final recommendations, and shared charity DME process. Pt husband reports he will get her a RW. SW did share will set up pt for [facility] medication assistance program. SW will give updates as available.   Dagny Fiorentino A Daniyal Tabor 08/19/2024, 1:44 PM    "

## 2024-08-20 LAB — CBC WITH DIFFERENTIAL/PLATELET
Abs Immature Granulocytes: 0.02 10*3/uL (ref 0.00–0.07)
Basophils Absolute: 0.1 10*3/uL (ref 0.0–0.1)
Basophils Relative: 1 %
Eosinophils Absolute: 0.3 10*3/uL (ref 0.0–0.5)
Eosinophils Relative: 4 %
HCT: 37.4 % (ref 36.0–46.0)
Hemoglobin: 12 g/dL (ref 12.0–15.0)
Immature Granulocytes: 0 %
Lymphocytes Relative: 24 %
Lymphs Abs: 1.9 10*3/uL (ref 0.7–4.0)
MCH: 31.5 pg (ref 26.0–34.0)
MCHC: 32.1 g/dL (ref 30.0–36.0)
MCV: 98.2 fL (ref 80.0–100.0)
Monocytes Absolute: 0.8 10*3/uL (ref 0.1–1.0)
Monocytes Relative: 10 %
Neutro Abs: 4.8 10*3/uL (ref 1.7–7.7)
Neutrophils Relative %: 61 %
Platelets: 84 10*3/uL — ABNORMAL LOW (ref 150–400)
RBC: 3.81 MIL/uL — ABNORMAL LOW (ref 3.87–5.11)
RDW: 18.3 % — ABNORMAL HIGH (ref 11.5–15.5)
WBC: 7.9 10*3/uL (ref 4.0–10.5)
nRBC: 0 % (ref 0.0–0.2)

## 2024-08-20 LAB — MAGNESIUM: Magnesium: 1.9 mg/dL (ref 1.7–2.4)

## 2024-08-20 LAB — BASIC METABOLIC PANEL WITH GFR
Anion gap: 8 (ref 5–15)
BUN: 7 mg/dL (ref 6–20)
CO2: 28 mmol/L (ref 22–32)
Calcium: 9.1 mg/dL (ref 8.9–10.3)
Chloride: 104 mmol/L (ref 98–111)
Creatinine, Ser: 0.69 mg/dL (ref 0.44–1.00)
GFR, Estimated: >60 mL/min
Glucose, Bld: 109 mg/dL — ABNORMAL HIGH (ref 70–99)
Potassium: 4.1 mmol/L (ref 3.5–5.1)
Sodium: 140 mmol/L (ref 135–145)

## 2024-08-20 LAB — HEPARIN LEVEL (UNFRACTIONATED): Heparin Unfractionated: 0.22 [IU]/mL — ABNORMAL LOW (ref 0.30–0.70)

## 2024-08-20 MED ORDER — APIXABAN 5 MG PO TABS
5.0000 mg | ORAL_TABLET | Freq: Two times a day (BID) | ORAL | Status: AC
  Administered 2024-08-20 – 2024-08-21 (×4): 5 mg via ORAL
  Filled 2024-08-20 (×4): qty 1

## 2024-08-20 NOTE — Progress Notes (Signed)
 Occupational Therapy Session Note  Patient Details  Name: Carly [name] MRN: [id number] Date of Birth: Sep 20, 1970  Today's Date: 08/20/2024 OT Individual Time: 1105-1200 OT Individual Time Calculation (min): 55 min   Today's Date: 08/20/2024 OT Individual Time: 8594-8554 OT Individual Time Calculation (min): 40 min    Short Term Goals: Week 1:  OT Short Term Goal 1 (Week 1): STGs=LTGs due to patient's ELOS.  Skilled Therapeutic Interventions/Progress Updates:   Session 1:  Pt greeted sitting in WC, reports 2/10 pain in R-hip, rest/positioning provided as needed. Pt ambulates ~50 ft towards main therapy gym with supervision + RW, before requiring a seated rest-break. In main therapy gym, pt instructed in dynamic standing balance including functional reach above head to place horseshoes. Pt performs with supervision + no UE support. Activity upgraded to include ~4 steps forward & ~4 step backs to retrieve and then place horseshoes all for BLE strengthening, progression towards LRAD, and reducing fall risk. Pt does require CGA and cues for management of antalgic gait. Pt then ambulates ~10 ft with unilateral support on hallway handrail. LBQC introduced, patient ambulates ~25 ft with CGA all for continued progress towards LRAD. Pt remained sitting EOB with immediate needs met and bed alarm activated.   Session 2: Pt greeted sitting EOB with mild reports of pain in R-hip, rest/shower provided for relief. Pt now using hurrycane for all transfers during session. CGA progressing to supervision for room-level ambulation and walk-in shower transfer. Long-handled sponge introduced for distal BLE bathing. Pt manages UB bathing/dressing with setup/supervision (outside of dependent care for cervical collar). LB bathing/dressing with Min A for standing posterior care to avoid twisting and to thread RLE. Pt remained sitting EOB with spouse at bedside, bed alarm activated, and immediate needs met.   Therapy  Documentation Precautions:  Precautions Precautions: Fall, Cervical, Back Recall of Precautions/Restrictions: Intact Precaution/Restrictions Comments: Verbally reviewed back precations. Required Braces or Orthoses: Cervical Brace Cervical Brace: At all times Restrictions Weight Bearing Restrictions Per Provider Order: Yes RLE Weight Bearing Per Provider Order: Weight bearing as tolerated LLE Weight Bearing Per Provider Order: Weight bearing as tolerated   Therapy/Group: Individual Therapy  Nereida Habermann, OTR/L, MSOT  08/20/2024, 7:43 AM

## 2024-08-20 NOTE — Progress Notes (Addendum)
 PHARMACY - ANTICOAGULATION CONSULT NOTE  Pharmacy Consult for heparin  Indication: chronic R femoral vein DVT  Allergies[1]  Patient Measurements: Height: 5' 3 (160 cm) Weight: 109.8 kg (242 lb 1 oz) IBW/kg (Calculated) : 52.4 HEPARIN  DW (KG): 78.8  Vital Signs: Temp: 98 F (36.7 C) (02/05 0357) Temp Source: Oral (02/05 0357) BP: 120/68 (02/05 0357) Pulse Rate: 78 (02/05 0357)  Labs: Recent Labs    08/18/24 0557 08/18/24 2103 08/19/24 0511 08/19/24 1855 08/20/24 0532  HGB 12.3  --  12.0  --  12.0  HCT 38.2  --  36.7  --  37.4  PLT 93*  --  88*  --  84*  LABPROT 14.5  --   --   --   --   INR 1.1  --   --   --   --   HEPARINUNFRC  --    < > 0.21* 0.22* 0.22*  CREATININE 0.56  --   --   --  0.69   < > = values in this interval not displayed.    Estimated Creatinine Clearance: 96.8 mL/min (by C-G formula based on SCr of 0.69 mg/dL).   Medical History: Past Medical History:  Diagnosis Date   Abnormal uterine bleeding (AUB) 08/26/2018   Alcohol abuse 01/11/2024   Alcohol-induced mood disorder with depressive symptoms (HCC) 01/11/2024   Anxiety    C. difficile diarrhea    07/04/21   Chicken pox    Depression    Depression, major, single episode, moderate (HCC) 08/04/2018   Diarrhea 06/07/2022   Dyspnea on exertion 05/24/2022   Elevated testosterone  level in female    Emphysema of lung (HCC)    bronchitis   Frequent headaches    Generalized anxiety disorder 08/23/2014   Hay fever    Hypertension    Idiopathic thrombocytopenic purpura (ITP) (HCC)    Iron deficiency 09/02/2017   Positive ANA (antinuclear antibody)    Tachycardia 09/09/2022   Thrombocytopenia 02/12/2016    Medications:  Medications Prior to Admission  Medication Sig Dispense Refill Last Dose/Taking   albuterol  (PROVENTIL ) (2.5 MG/3ML) 0.083% nebulizer solution Take 3 mLs (2.5 mg total) by nebulization every 4 (four) hours as needed for wheezing or shortness of breath. 150 mL 5     albuterol  (VENTOLIN  HFA) 108 (90 Base) MCG/ACT inhaler Inhale 2 puffs into the lungs every 4 (four) hours as needed for wheezing or shortness of breath. 6.7 each 3    ARIPiprazole  (ABILIFY ) 5 MG tablet Take 1 tablet (5 mg total) by mouth daily. 30 tablet 0    benzonatate  (TESSALON ) 100 MG capsule Take 1 capsule (100 mg total) by mouth 3 (three) times daily as needed for cough. 30 capsule 0    BREZTRI  AEROSPHERE 160-9-4.8 MCG/ACT AERO inhaler INHALE 2 PUFFS INTO THE LUNGS IN THE MORNING AND AT BEDTIME. 10.7 each 5    escitalopram  (LEXAPRO ) 10 MG tablet Take 10 mg by mouth daily.      hydrOXYzine  (ATARAX ) 25 MG tablet Take 1 tablet (25 mg total) by mouth 2 (two) times daily as needed for anxiety. 60 tablet 0    hydrOXYzine  (ATARAX ) 25 MG tablet Take 25 mg by mouth at bedtime as needed for anxiety.      ipratropium (ATROVENT ) 0.03 % nasal spray Place 2 sprays into both nostrils every 12 (twelve) hours. 30 mL 0    lamoTRIgine  (LAMICTAL ) 100 MG tablet Take 100 mg by mouth daily.      metoprolol  succinate (TOPROL -XL) 100 MG 24  hr tablet Take 1 tablet (100 mg total) by mouth daily. TAKE WITH OR IMMEDIATELY FOLLOWING A MEAL. 90 tablet 3    omeprazole  (PRILOSEC) 20 MG capsule Take 1 capsule (20 mg total) by mouth daily. 90 capsule 3    promethazine -dextromethorphan  (PROMETHAZINE -DM) 6.25-15 MG/5ML syrup Take 2.5 mLs by mouth at bedtime as needed for cough. 118 mL 0    Scheduled:   ARIPiprazole   5 mg Oral Daily   budesonide -glycopyrrolate -formoterol   2 puff Inhalation BID   calcium  citrate  200 mg of elemental calcium  Oral Q lunch   escitalopram   10 mg Oral Daily   feeding supplement  1 Container Oral TID BM   folic acid   1 mg Oral Daily   ipratropium  2 spray Each Nare Q12H   magnesium  chloride  1 tablet Oral BID   metoprolol  succinate  100 mg Oral Daily   multivitamin with minerals  1 tablet Oral Daily   nicotine   14 mg Transdermal Daily   pantoprazole   40 mg Oral Daily   thiamine   100 mg Oral  Daily    Assessment: Patient is a 54yoF admitted to CIR today post SAH, SDH, and small mesenteric contusion at mesenteric vein on 08/08/24. Neurology reports repeat CT head on 08/09/24 was stable. PMH significant for immune thrombocytopenic purpura (ITP). No anticoagulation PTA. Pharmacy was originally consulted for enoxaparin  but requested to switch to heparin  with patient at high risk for bleed. Platelets are improved today at 93, Hgb WNL at 12.3. Due to bleed risk factors, will not bolus and set a lower goal.   2/5 AM: Heparin  level 0.22 is subtherapeutic on 1550 units/hr. hgb stable. Plt 84.  No issues with infusion or bleeding per RN.  Goal of Therapy:  Heparin  level 0.3-0.5 units/ml Monitor platelets by anticoagulation protocol: Yes   Plan:  -Increase heparin  infusion to 1650 units/hr -Monitor Hgb, PLT and signs/symptoms of bleeding - F/U long term AC plan (per onc)    ADDENDUM:  Discussed with MD and given PLT stable, will transition heparin  to apixaban . Patient is uninsured, so will need patient assistance forms filled out.   - STOP heparin   - START apixaban  5 mg PO BID- administer first dose of apixaban  at the same time the heparin  infusion is stopped  - AVS posted, needs education, patient assistance- will start today   Massie Fila, PharmD Clinical Pharmacist  08/20/2024 7:37 AM          [1]  Allergies Allergen Reactions   Wellbutrin  [Bupropion ] Anxiety    Crazy    Meloxicam  Nausea Only

## 2024-08-20 NOTE — IPOC Note (Signed)
 Overall Plan of Care [facility]) Patient Details Name: Carly [name] MRN: [id number] DOB: 03-27-1971  Admitting Diagnosis: Trauma  Hospital Problems: Principal Problem:   Trauma     Functional Problem List: Nursing Edema, Endurance, Medication Management, Pain, Safety, Skin Integrity  PT Balance, Edema, Endurance, Motor, Pain, Safety, Skin Integrity  OT Balance, Endurance, Motor, Pain, Safety  SLP  (N/A skilled ST not warranted)  TR         Basic ADLs: OT Bathing, Dressing, Toileting     Advanced  ADLs: OT Simple Meal Preparation, Light Housekeeping     Transfers: PT Bed Mobility, Bed to Chair, Car, Lobbyist, Technical Brewer: PT Ambulation, Psychologist, [employer], Stairs     Additional Impairments: OT    SLP        TR      Anticipated Outcomes Item Anticipated Outcome  Self Feeding    Swallowing      Basic self-care  Mod I  Toileting  Mod I   Bathroom Transfers Mod I  Bowel/Bladder  manage bowels/bladder with mod I assistance  Transfers  mod I basic transfers; S car  Locomotion  mod I household gait, S stairs  Communication     Cognition     Pain  <4 w/prn  Safety/Judgment  manage safety with mod I assistance   Therapy Plan: PT Intensity: Minimum of 1-2 x/day ,45 to 90 minutes PT Frequency: 5 out of 7 days PT Duration Estimated Length of Stay: 7-10 days OT Intensity: Minimum of 1-2 x/day, 45 to 90 minutes OT Frequency: 5 out of 7 days OT Duration/Estimated Length of Stay: 7-10 days SLP Intensity:  (N/A skilled ST not warranted) SLP Frequency:  (N/A skilled ST not warranted) SLP Duration/Estimated Length of Stay: N/A skilled ST not warranted   Team Interventions: Nursing Interventions Patient/Family Education, Bladder Management, Bowel Management, Disease Management/Prevention, Pain Management, Medication Management, Skin Care/Wound Management, Discharge Planning  PT interventions Ambulation/gait training,  Balance/vestibular training, Community reintegration, Discharge planning, Disease management/prevention, DME/adaptive equipment instruction, Functional mobility training, Neuromuscular re-education, Pain management, Patient/family education, Psychosocial support, Skin care/wound management, Splinting/orthotics, Stair training, Therapeutic Activities, Therapeutic Exercise, UE/LE Strength taining/ROM, Wheelchair propulsion/positioning  OT Interventions Balance/vestibular training, Discharge planning, Pain management, Self Care/advanced ADL retraining, Therapeutic Activities, UE/LE Coordination activities, Disease mangement/prevention, Functional mobility training, Patient/family education, Therapeutic Exercise, Community reintegration, Fish Farm Manager, UE/LE Strength taining/ROM  SLP Interventions  (N/A skilled ST not warranted)  TR Interventions    SW/CM Interventions Discharge Planning, Psychosocial Support, Patient/Family Education   Barriers to Discharge MD  Medical stability, Home enviroment access/loayout, Lack of/limited family support, Insurance for SNF coverage, and Weight  Nursing Decreased caregiver support, Home environment access/layout, Wound Care, Weight bearing restrictions, Medication compliance, Behavior Discharge: House  Discharge Home Layout: One level  Discharge Home Access: Level entry  PT None    OT Weight, Other (comments) COPD, ROM precautions  SLP  (N/A skilled ST not warranted)    SW Decreased caregiver support, Lack of/limited family support, Insurance for SNF coverage, Other (comments) Pt is uninsured   Team Discharge Planning: Destination: PT-Home ,OT- Home , SLP-Home Projected Follow-up: PT-Outpatient PT, OT-  None, SLP-None Projected Equipment Needs: PT-Rolling walker with 5 wheels, OT- To be determined, SLP-None recommended by SLP Equipment Details: PT- , OT-  Patient/family involved in discharge planning: PT- Patient, Family  member/caregiver,  OT-Patient, SLP-Patient  MD ELOS: 8-12 days Medical Rehab Prognosis:  Excellent Assessment: The patient has been admitted for CIR  therapies with the diagnosis of TBI. The team will be addressing functional mobility, strength, stamina, balance, safety, adaptive techniques and equipment, self-care, bowel and bladder mgt, patient and caregiver education,. Goals have been set at supervision. Anticipated discharge destination is home.       See Team Conference Notes for weekly updates to the plan of care

## 2024-08-20 NOTE — Progress Notes (Signed)
 Patient ID: Carly [name], female   DOB: 07/25/1970, 54 y.o.   MRN: [id number]  SW set pt up for [facility] medication assistance program.   Graeme Jude, MSW, LCSW Office: [phone] Cell: [phone] Fax: [phone]
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
## 2024-08-20 NOTE — Plan of Care (Signed)
" °  Problem: Consults Goal: RH GENERAL PATIENT EDUCATION Description: See Patient Education module for education specifics. Outcome: Progressing   Problem: RH BOWEL ELIMINATION Goal: RH STG MANAGE BOWEL WITH ASSISTANCE Description: STG Manage Bowel with mod I Assistance. Outcome: Progressing   Problem: RH BLADDER ELIMINATION Goal: RH STG MANAGE BLADDER WITH ASSISTANCE Description: STG Manage Bladder With mod I Assistance Outcome: Progressing   Problem: RH SKIN INTEGRITY Goal: RH STG SKIN FREE OF INFECTION/BREAKDOWN Description: Manage skin with mod I assistance Outcome: Progressing   Problem: RH SAFETY Goal: RH STG ADHERE TO SAFETY PRECAUTIONS W/ASSISTANCE/DEVICE Description: STG Adhere to Safety Precautions With mod I Assistance/Device. Outcome: Progressing   Problem: RH PAIN MANAGEMENT Goal: RH STG PAIN MANAGED AT OR BELOW PT'S PAIN GOAL Description: <4 w/ prns Outcome: Progressing   Problem: RH KNOWLEDGE DEFICIT GENERAL Goal: RH STG INCREASE KNOWLEDGE OF SELF CARE AFTER HOSPITALIZATION Description: Manage increase knowledge of self care after hospitalization with mod I assistance from spouse using educational materials provided Outcome: Progressing   Problem: Consults Goal: RH BRAIN INJURY PATIENT EDUCATION Description: Description: See Patient Education module for eduction specifics Outcome: Progressing   Problem: RH KNOWLEDGE DEFICIT BRAIN INJURY Goal: RH STG INCREASE KNOWLEDGE OF SELF CARE AFTER BRAIN INJURY Description: Increase knowledge of self care after brain injury with mod I assistance from husband using educational materials provided Outcome: Progressing   "

## 2024-08-20 NOTE — Progress Notes (Signed)
 Physical Therapy Session Note  Patient Details  Name: REAGYN FACEMIRE MRN: [id number] Date of Birth: 1970-08-22  Today's Date: 08/20/2024 PT Individual Time: 1005-1045 + 1305-1400 PT Individual Time Calculation (min): 40 min + 55 min  Short Term Goals: Week 1:  PT Short Term Goal 1 (Week 1): = LTGS  Skilled Therapeutic Interventions/Progress Updates:    SESSION 1: Pt presents in room seated EOB, agreeable to PT. Pt reporting soreness but states premedicated. Session focused on gait training for upright tolerance, stair negotaition, and improving weightbearing tolerance on BLE, as well as NMR for dynamic standing balance, BLE muscle fiber recruitment needed for gait and transfers. Pt completes transfers with CGA/supervision with RW throughout session. Pt transported to main gym for time management and energy conservaiton. Pt ambulates 175' with RW CGA/close supervision, slow gait speed but decreasing UE weightbearing on RW. Pt takes extended seated rest break then completes up/down 4 steps with BHRs CGA/close supervision able to verbalize which LE to lead with for ascending and descending for safety and decreased pain. Pt then completes NMR with 2# ankle weight on RLE, 2.5# ankle weight on LLE BUE support on RW including: - standing marches x20 alternating BLE - mini squats x10 - hamstring curl x20 alternating BLE - heel raise x20 Pt returns to room and remains seated in [facility] with all needs within reach, cal light in place at end of session.   SESSION 2: Pt presents in room seated EOB, agreeable to PT. Pt reports ongoing soreness and fatigue but not limited by this during session. Session focused on gait training with [facility], therapeutic activities to promote activity tolerance and BLE muscle fiber recruitment, and NMR to promote multidirectional stepping stability and single limb stability. Pt completes transfer with supervision with [facility] from bed to WC. Pt transported to day room and positioned for  transfer to nustep, completed with supervision. Pt completes continuous activity with BUE and BLE for 3 minutes, then 7 min of BLEs only on level 3 resistance, completed to promote improved activity tolerance and BLE strengthening/weightbearing tolerance. Pt then completes gait with [facility] 60' and with SPC 49' with two turns, requires CGA/close supervision, demonstrating similar postural stability, slow gait speed, decreased stance time RLE. Pt takes seated rest break then completes NMR for dynamic standing balance, multidirectional stepping stability with SPC including: - forward/backward walking 3x10' - navigating around 4 cones x2 trials - side stepping between 2 cones 3x6' - step taps alternating BLE x20 - lateral step taps x10 BLE Pt returns to room and remains seated EOB with all needs within reach, cal light in place at end of session.   Therapy Documentation Precautions:  Precautions Precautions: Fall, Cervical, Back Recall of Precautions/Restrictions: Intact Precaution/Restrictions Comments: Verbally reviewed back precations. Required Braces or Orthoses: Cervical Brace Cervical Brace: At all times Restrictions Weight Bearing Restrictions Per Provider Order: No RLE Weight Bearing Per Provider Order: Weight bearing as tolerated LLE Weight Bearing Per Provider Order: Weight bearing as tolerated   Therapy/Group: Individual Therapy  Reche Ohara PT, DPT 08/20/2024, 11:11 AM

## 2024-08-21 ENCOUNTER — Other Ambulatory Visit (HOSPITAL_COMMUNITY): Payer: Self-pay

## 2024-08-21 LAB — CBC WITH DIFFERENTIAL/PLATELET
Abs Immature Granulocytes: 0.01 10*3/uL (ref 0.00–0.07)
Basophils Absolute: 0.1 10*3/uL (ref 0.0–0.1)
Basophils Relative: 1 %
Eosinophils Absolute: 0.3 10*3/uL (ref 0.0–0.5)
Eosinophils Relative: 4 %
HCT: 38.2 % (ref 36.0–46.0)
Hemoglobin: 11.9 g/dL — ABNORMAL LOW (ref 12.0–15.0)
Immature Granulocytes: 0 %
Lymphocytes Relative: 25 %
Lymphs Abs: 1.9 10*3/uL (ref 0.7–4.0)
MCH: 30.9 pg (ref 26.0–34.0)
MCHC: 31.2 g/dL (ref 30.0–36.0)
MCV: 99.2 fL (ref 80.0–100.0)
Monocytes Absolute: 0.8 10*3/uL (ref 0.1–1.0)
Monocytes Relative: 10 %
Neutro Abs: 4.6 10*3/uL (ref 1.7–7.7)
Neutrophils Relative %: 60 %
Platelets: 85 10*3/uL — ABNORMAL LOW (ref 150–400)
RBC: 3.85 MIL/uL — ABNORMAL LOW (ref 3.87–5.11)
RDW: 18.1 % — ABNORMAL HIGH (ref 11.5–15.5)
WBC: 7.7 10*3/uL (ref 4.0–10.5)
nRBC: 0 % (ref 0.0–0.2)

## 2024-08-21 NOTE — Progress Notes (Signed)
 Occupational Therapy Session Note  Patient Details  Name: Carly [name] MRN: [id number] Date of Birth: [birth year]  Today's Date: 08/21/2024 OT Individual Time: 8694-8584 OT Individual Time Calculation (min): 70 min   Short Term Goals: Week 1:  OT Short Term Goal 1 (Week 1): STGs=LTGs due to patient's ELOS.  Skilled Therapeutic Interventions/Progress Updates:  Pt greeted seated EOB for skilled OT session with focus on d/c planning, IADLs, and BUE strengthening.   Pain: Pt reported un-rated pain/discomfort in pelvis and neck. OT offering intermediate rest breaks and positioning suggestions throughout session to address pain/fatigue and maximize participation/safety in session.   Functional Transfers: Pt completed ambulating transfer household distance, using hurrycane with CGA. Pt required supervision for series of sit<>stands, and verbal cues for advancement of cane while ambulating throughout session.  Self Care Tasks: Pt completes the following self care tasks with levels of assistance noted below, Toileting: Pt completed 3/3 toileting tasks with supervision for sit<>stand from toilet. Pt continent of bladder.  Therapeutic Exercise: Pt completed the following exercises with visual demonstrations and verbal cues for correct muscular activation. Yellow theraband utilized, details below.  2 x 10 chest pulls  2 x 10 shoulder flexion 2 x 10 shoulder diagonals 2 x 10 elbow flexion 2 x 10 elbow extension  Therapeutic Activity: Pt completed standing balance activity for threshold management and fall prevention. Pt stepped forwards/backwards over obstacle using hurrycane with CGA. Pt required verbal cues for advancement and placement of cane throughout activity.   Education: Pt educated on use of TTB for tub/shower transfers upon d/c. Pt practiced transfer with supervision. Pt provided with handout to reference for purchase of TTB. Pt also educated on energy conservation methods such as taking  frequent rest breaks and creating landing spots within her home. Pt practiced navigating kitchen environment to gather food items using hurrycane with supervision. OTS emphasized placing household items in accessible locations for cervical and back precaution adherence.   Pt remained seated EOB with 4Ps assessed and immediate needs met. Pt continues to be appropriate for skilled OT intervention to promote further functional independence in ADLs/IADLs.   Therapy Documentation Precautions:  Precautions Precautions: Fall, Cervical, Back Recall of Precautions/Restrictions: Intact Precaution/Restrictions Comments: Verbally reviewed back precations. Required Braces or Orthoses: Cervical Brace Cervical Brace: At all times Restrictions Weight Bearing Restrictions Per Provider Order: No RLE Weight Bearing Per Provider Order: Weight bearing as tolerated LLE Weight Bearing Per Provider Order: Weight bearing as tolerated  Therapy/Group: Individual Therapy  Misty Pacini, OTS 08/21/2024, 7:32 AM

## 2024-08-21 NOTE — Progress Notes (Signed)
 Physical Therapy Session Note  Patient Details  Name: Carly [name] MRN: [id number] Date of Birth: 1970-07-25  Today's Date: 08/21/2024 PT Individual Time: 0932-1042 PT Individual Time Calculation (min): 70 min   Short Term Goals: Week 1:  PT Short Term Goal 1 (Week 1): = LTGS  Skilled Therapeutic Interventions/Progress Updates:    Pt presents in bed, mod I for bed mobility to come to EOB. S for transfer with [facility] and performed gait x 100' with close S/CGA overall for balance. Distance limited by endurance, seated rest break needed.  Instructed in NMR for dynamic balance retraining on compliant surface including functional reaching task to R and L, mini squats to reach lower surface, and then performed below HEP. Pt reported wanting to look into purchasing a foam pad for home - showed her some options on [employer] as well and issued HEP below. Educated on way to modify exercises to increase challenge, decrease challenge, and safety recommendations.    Access Code: [email or web address] URL: [email or web address] Date: 08/21/2024 Prepared by: Donald  Exercises ( **Use foam pad at home for these as well to increased balance challenge)  - Standing Heel Raise with Support  - 1 x daily - 7 x weekly - 3 sets - 15 reps - Standing Toe Raises at Chair  - 1 x daily - 7 x weekly - 3 sets - 15 reps - Mini Squat with Counter Support  - 1 x daily - 7 x weekly - 3 sets - 15 reps - Standing on Foam Pad  - 1 x daily - 7 x weekly - 3 sets - 5 reps - 30 hold   Dynamic gait training with focus on turns and sidestepping for higher level balance retraining navigating obstacles, turns and sidestepping R and L CGA overall with slow cadence and slightly antaglic gait pattern x 50' total.  Gait again x 53' with SPC with CGA approaching S level with focus on functional mobility and endurance training.  Stair negotiation training for practice to access son's home x 4 steps with B rails with overall CGA/close S  with cues only to confirm which foot to lead with. More fluid gait pattern noted than when this clinician worked with pt last time.  Education on use of incentive spirometer with pt and husband once back in room per MD recommendations. Also educated husband on HEP and foam pad.     Therapy Documentation Precautions:  Precautions Precautions: Fall, Cervical, Back Recall of Precautions/Restrictions: Intact Precaution/Restrictions Comments: Verbally reviewed back precations. Required Braces or Orthoses: Cervical Brace Cervical Brace: At all times Restrictions Weight Bearing Restrictions Per Provider Order: No RLE Weight Bearing Per Provider Order: Weight bearing as tolerated LLE Weight Bearing Per Provider Order: Weight bearing as tolerated  Pain: Reports already received pain medication (reports in neck, RLE) - did not rate.    Therapy/Group: Individual Therapy  Elnor Donald Sherrell Donald WENDI Elnor, PT, DPT, CBIS  08/21/2024, 12:21 PM
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
## 2024-08-21 NOTE — Group Note (Signed)
 Patient Details Name: Carly [name] MRN: [id number] DOB: April 14, 1971 Today's Date: 08/21/2024  Time Calculation: OT Group Time Calculation OT Group Start Time: 1100 OT Group Stop Time: 1200 OT Group Time Calculation (min): 60 min      Group Description: Dance Group: Pt participated in dance group with an emphasis on social interaction, motor planning, increasing overall activity tolerance and bimanual tasks. All songs were selected by group members. Dance moves included AROM of BUE/BLE gross motor movements with an emphasis on building functional endurance.   Individual level documentation: Patient completed group from sitting level. Patientt needed supervision to complete various dance moves with OT providing visual model.  Patient able to create her own modifications during group.  Pain: Pain Assessment Pain Scale: 0-10 Pain Score: 5  Pain Location: Pelvis Pain Intervention(s): Medication (See eMAR) prior to session; Rest breaks  Precautions:  Cervical with c-collar, back  Katheryn SHAUNNA [place] 08/21/2024, 12:28 PM
